# Patient Record
Sex: Male | Born: 1950
Health system: Southern US, Community
[De-identification: ages and names within clinical notes are randomized; demographics above are authoritative.]

## PROBLEM LIST (undated history)

## (undated) DIAGNOSIS — F4310 Post-traumatic stress disorder, unspecified: Secondary | ICD-10-CM

## (undated) DIAGNOSIS — G7289 Other specified myopathies: Secondary | ICD-10-CM

## (undated) DIAGNOSIS — N1831 Chronic kidney disease, stage 3a: Secondary | ICD-10-CM

## (undated) DIAGNOSIS — J40 Bronchitis, not specified as acute or chronic: Secondary | ICD-10-CM

## (undated) DIAGNOSIS — I5022 Chronic systolic (congestive) heart failure: Secondary | ICD-10-CM

## (undated) DIAGNOSIS — M359 Systemic involvement of connective tissue, unspecified: Secondary | ICD-10-CM

## (undated) DIAGNOSIS — E039 Hypothyroidism, unspecified: Secondary | ICD-10-CM

## (undated) DIAGNOSIS — I82409 Acute embolism and thrombosis of unspecified deep veins of unspecified lower extremity: Secondary | ICD-10-CM

## (undated) DIAGNOSIS — M2550 Pain in unspecified joint: Secondary | ICD-10-CM

## (undated) DIAGNOSIS — I214 Non-ST elevation (NSTEMI) myocardial infarction: Secondary | ICD-10-CM

## (undated) DIAGNOSIS — I2510 Atherosclerotic heart disease of native coronary artery without angina pectoris: Secondary | ICD-10-CM

## (undated) DIAGNOSIS — I2699 Other pulmonary embolism without acute cor pulmonale: Secondary | ICD-10-CM

## (undated) DIAGNOSIS — K219 Gastro-esophageal reflux disease without esophagitis: Secondary | ICD-10-CM

## (undated) DIAGNOSIS — R066 Hiccough: Secondary | ICD-10-CM

## (undated) DIAGNOSIS — E119 Type 2 diabetes mellitus without complications: Secondary | ICD-10-CM

## (undated) HISTORY — DX: Post-traumatic stress disorder, unspecified: F43.10

## (undated) HISTORY — PX: BIOPSY SHOULDER: PRO31

## (undated) HISTORY — DX: Hypothyroidism, unspecified: E03.9

## (undated) HISTORY — DX: Pain in unspecified joint: M25.50

## (undated) HISTORY — PX: UPPER LEG SOFT TISSUE BIOPSY: SUR152

---

## 2001-06-18 ENCOUNTER — Encounter: Admission: RE | Admit: 2001-06-18 | Discharge: 2001-09-16 | Payer: Self-pay | Admitting: Family Medicine

## 2007-07-04 ENCOUNTER — Emergency Department (HOSPITAL_COMMUNITY): Admission: EM | Admit: 2007-07-04 | Discharge: 2007-07-04 | Payer: Self-pay | Admitting: Emergency Medicine

## 2007-07-23 ENCOUNTER — Encounter: Admission: RE | Admit: 2007-07-23 | Discharge: 2007-10-21 | Payer: Self-pay | Admitting: Family Medicine

## 2007-10-23 ENCOUNTER — Encounter: Admission: RE | Admit: 2007-10-23 | Discharge: 2007-11-07 | Payer: Self-pay | Admitting: Family Medicine

## 2007-12-05 ENCOUNTER — Ambulatory Visit (HOSPITAL_COMMUNITY): Admission: RE | Admit: 2007-12-05 | Discharge: 2007-12-05 | Payer: Self-pay | Admitting: Family Medicine

## 2008-02-10 ENCOUNTER — Ambulatory Visit (HOSPITAL_COMMUNITY): Admission: RE | Admit: 2008-02-10 | Discharge: 2008-02-10 | Payer: Self-pay | Admitting: Family Medicine

## 2011-01-22 LAB — POCT CARDIAC MARKERS
CKMB, poc: 2.6
Operator id: 285841
Troponin i, poc: <0.05

## 2011-01-22 LAB — POCT I-STAT CREATININE
Creatinine, Ser: 1.5
Operator id: 285841

## 2011-01-22 LAB — I-STAT 8, (EC8 V) (CONVERTED LAB)
BUN: 15
Bicarbonate: 27.9 — ABNORMAL HIGH
Potassium: 4.4
Sodium: 137

## 2013-07-08 ENCOUNTER — Encounter (HOSPITAL_BASED_OUTPATIENT_CLINIC_OR_DEPARTMENT_OTHER): Payer: Self-pay | Admitting: Emergency Medicine

## 2013-07-08 ENCOUNTER — Emergency Department (HOSPITAL_BASED_OUTPATIENT_CLINIC_OR_DEPARTMENT_OTHER): Payer: Medicare Other

## 2013-07-08 ENCOUNTER — Emergency Department (HOSPITAL_BASED_OUTPATIENT_CLINIC_OR_DEPARTMENT_OTHER)
Admission: EM | Admit: 2013-07-08 | Discharge: 2013-07-08 | Disposition: A | Discharge status: Home / Self Care | Payer: Medicare Other | Attending: Emergency Medicine | Admitting: Emergency Medicine

## 2013-07-08 DIAGNOSIS — J329 Chronic sinusitis, unspecified: Secondary | ICD-10-CM | POA: Insufficient documentation

## 2013-07-08 DIAGNOSIS — E119 Type 2 diabetes mellitus without complications: Secondary | ICD-10-CM | POA: Insufficient documentation

## 2013-07-08 DIAGNOSIS — IMO0002 Reserved for concepts with insufficient information to code with codable children: Secondary | ICD-10-CM | POA: Diagnosis not present

## 2013-07-08 DIAGNOSIS — Z7982 Long term (current) use of aspirin: Secondary | ICD-10-CM | POA: Insufficient documentation

## 2013-07-08 DIAGNOSIS — K219 Gastro-esophageal reflux disease without esophagitis: Secondary | ICD-10-CM | POA: Insufficient documentation

## 2013-07-08 DIAGNOSIS — J029 Acute pharyngitis, unspecified: Secondary | ICD-10-CM | POA: Diagnosis present

## 2013-07-08 DIAGNOSIS — Z79899 Other long term (current) drug therapy: Secondary | ICD-10-CM | POA: Insufficient documentation

## 2013-07-08 HISTORY — DX: Gastro-esophageal reflux disease without esophagitis: K21.9

## 2013-07-08 HISTORY — DX: Type 2 diabetes mellitus without complications: E11.9

## 2013-07-08 MED ORDER — DEXAMETHASONE 4 MG PO TABS
ORAL_TABLET | ORAL | Status: AC
  Filled 2013-07-08: qty 3

## 2013-07-08 MED ORDER — ACETAMINOPHEN 500 MG PO TABS
1000.0000 mg | ORAL_TABLET | Freq: Once | ORAL | Status: AC
  Administered 2013-07-08: 1000 mg via ORAL
  Filled 2013-07-08: qty 2

## 2013-07-08 MED ORDER — AMOXICILLIN 500 MG PO CAPS: 500.0000 mg | ORAL_CAPSULE | Freq: Three times a day (TID) | ORAL | Status: DC

## 2013-07-08 MED ORDER — DEXAMETHASONE 4 MG PO TABS
10.0000 mg | ORAL_TABLET | Freq: Once | ORAL | Status: AC
  Administered 2013-07-08: 10 mg via ORAL

## 2013-07-08 MED ORDER — IBUPROFEN 400 MG PO TABS
600.0000 mg | ORAL_TABLET | Freq: Once | ORAL | Status: AC
  Administered 2013-07-08: 600 mg via ORAL
  Filled 2013-07-08 (×2): qty 1

## 2013-07-08 NOTE — ED Notes (Signed)
Patient transported to X-ray 

## 2013-07-08 NOTE — ED Notes (Signed)
Pt reports facial pain "sinuses", cough, "shaking", and unable to sleep since Friday.  Symptoms worsened yesterday.  He has been taking numerous OTC medications without relief.

## 2013-07-08 NOTE — ED Provider Notes (Signed)
TIME SEEN: 10:14 AM  CHIEF COMPLAINT: Sinus congestion, cough  HPI: Patient is a 62 over no other history of diabetes and myositis who is on prednisone chronically who is followed by the [facility] who presents to the emergency department with complaints of 5 days of sinus pressure, cough, sore throat, chills. He states she's been trying multiple over-the-counter medications without relief. He states he has had sinus infections in the past and has needed antibiotics. He states normally amoxicillin helps with the symptoms. Denies any vomiting or diarrhea. No headache, neck pain or neck stiffness.  ROS: See HPI Constitutional: no fever  Eyes: no drainage  ENT: no runny nose   Cardiovascular:  no chest pain  Resp: no SOB  GI: no vomiting GU: no dysuria Integumentary: no rash  Allergy: no hives  Musculoskeletal: no leg swelling  Neurological: no slurred speech ROS otherwise negative  PAST MEDICAL HISTORY/PAST SURGICAL HISTORY:  Past Medical History  Diagnosis Date  . Diabetes mellitus without complication   . GERD (gastroesophageal reflux disease)     MEDICATIONS:  Prior to Admission medications   Medication Sig Start Date End Date Taking? Authorizing Provider  acarbose (PRECOSE) 100 MG tablet Take 100 mg by mouth 3 (three) times daily with meals.   Yes Historical Provider, MD  aspirin 81 MG tablet Take 81 mg by mouth daily.   Yes Historical Provider, MD  calcium carbonate (OS-CAL) 600 MG TABS tablet Take 600 mg by mouth 2 (two) times daily with a meal.   Yes Historical Provider, MD  glipiZIDE (GLUCOTROL XL) 10 MG 24 hr tablet Take 10 mg by mouth daily with breakfast.   Yes Historical Provider, MD  losartan (COZAAR) 50 MG tablet Take 50 mg by mouth daily.   Yes Historical Provider, MD  omeprazole (PRILOSEC) 20 MG capsule Take 20 mg by mouth daily.   Yes Historical Provider, MD  predniSONE (DELTASONE) 5 MG tablet Take 5 mg by mouth daily with breakfast.   Yes Historical Provider, MD     ALLERGIES:  Allergies  Allergen Reactions  . Hydrocodone   . Shellfish Allergy   . Ultram [Tramadol]     "shakes"    SOCIAL HISTORY:  History  Substance Use Topics  . Smoking status: Not on file  . Smokeless tobacco: Not on file  . Alcohol Use: Not on file    FAMILY HISTORY: No family history on file.  EXAM: BP 164/99  Pulse 120  Temp(Src) 99.6 F (37.6 C) (Oral)  Resp 20  SpO2 96% CONSTITUTIONAL: Alert and oriented and responds appropriately to questions. Well-appearing; well-nourished, pleasant, no apparent distress HEAD: Normocephalic EYES: Conjunctivae clear, PERRL ENT: normal nose; no rhinorrhea; moist mucous membranes; pharynx without lesions noted, no tonsillar hypertrophy or exudate, tender to palpation over bilateral maxillary sinuses; TMs are clear bilaterally NECK: Supple, no meningismus, no LAD  CARD: Tachycardic and regular; S1 and S2 appreciated; no murmurs, no clicks, no rubs, no gallops RESP: Normal chest excursion without splinting or tachypnea; breath sounds clear and equal bilaterally; no wheezes, no rhonchi, no rales,  ABD/GI: Normal bowel sounds; non-distended; soft, non-tender, no rebound, no guarding BACK:  The back appears normal and is non-tender to palpation, there is no CVA tenderness EXT: Normal ROM in all joints; non-tender to palpation; no edema; normal capillary refill; no cyanosis    SKIN: Normal color for age and race; warm NEURO: Moves all extremities equally PSYCH: The patient's mood and manner are appropriate. Grooming and personal hygiene are appropriate.  MEDICAL DECISION MAKING: Patient here with sinusitis. He is tachycardic but has a slightly elevated oral temperature. We'll give Tylenol, Decadron. We'll also obtain chest x-ray to rule out infiltrate. He is otherwise very well-appearing, pleasant, nontoxic, well-hydrated. Anticipate discharge home with oral antibiotics. Patient and wife are comfortable with this plan.  ED  PROGRESS: CXR clear.  No infiltrate or edema or pneumothorax. Will recheck heart rate after Tylenol.   11:20 AM Patient's temperature is now 99.7 he is still tachycardic. We'll give dose of ibuprofen. Patient reports he is seen by a nephrologist but states he's been told that he is okay to take NSAIDs. EKG shows sinus tachycardia.  12:13 PM  HR improving as temperature is improving.  Pt reports he had blood  work drawn last week at the [place] and he reports it was normal. No history of anemia. Denies any vomiting or diarrhea. Does not appear dehydrated on exam. Patient is sleeping comfortably. I do not feel he needs repeat blood work at this time and the patient and family agree. We'll discharge him with prescription for amoxicillin for his sinusitis. Given return precautions, supportive care instructions. Patient and wife verbalize understanding and are comfortable with plan.     EKG Interpretation  Date/Time:  Wednesday July 08 2013 11:19:37 EDT Ventricular Rate:  122 PR Interval:  148 QRS Duration: 82 QT Interval:  308 QTC Calculation: 438 R Axis:   -27 Text Interpretation:  Sinus tachycardia Minimal voltage criteria for LVH, may be normal variant Septal infarct , age undetermined Abnormal ECG Confirmed by Tyan Dy,  DO, Tayen Narang [phone]) on 07/08/2013 11:24:02 AM        [place], DO 07/08/13 1215

## 2013-07-08 NOTE — ED Notes (Signed)
MD at bedside. 

## 2013-07-08 NOTE — Discharge Instructions (Signed)

## 2014-07-15 ENCOUNTER — Encounter (HOSPITAL_BASED_OUTPATIENT_CLINIC_OR_DEPARTMENT_OTHER): Payer: Self-pay

## 2014-07-15 ENCOUNTER — Emergency Department (HOSPITAL_BASED_OUTPATIENT_CLINIC_OR_DEPARTMENT_OTHER)
Admission: EM | Admit: 2014-07-15 | Discharge: 2014-07-15 | Disposition: A | Discharge status: Home / Self Care | Payer: Medicare Other | Attending: Emergency Medicine | Admitting: Emergency Medicine

## 2014-07-15 ENCOUNTER — Emergency Department (HOSPITAL_BASED_OUTPATIENT_CLINIC_OR_DEPARTMENT_OTHER): Payer: Medicare Other

## 2014-07-15 DIAGNOSIS — Z8739 Personal history of other diseases of the musculoskeletal system and connective tissue: Secondary | ICD-10-CM | POA: Insufficient documentation

## 2014-07-15 DIAGNOSIS — Z7982 Long term (current) use of aspirin: Secondary | ICD-10-CM | POA: Diagnosis not present

## 2014-07-15 DIAGNOSIS — K219 Gastro-esophageal reflux disease without esophagitis: Secondary | ICD-10-CM | POA: Insufficient documentation

## 2014-07-15 DIAGNOSIS — Z79899 Other long term (current) drug therapy: Secondary | ICD-10-CM | POA: Insufficient documentation

## 2014-07-15 DIAGNOSIS — J069 Acute upper respiratory infection, unspecified: Secondary | ICD-10-CM | POA: Diagnosis not present

## 2014-07-15 DIAGNOSIS — E119 Type 2 diabetes mellitus without complications: Secondary | ICD-10-CM | POA: Insufficient documentation

## 2014-07-15 DIAGNOSIS — Z7952 Long term (current) use of systemic steroids: Secondary | ICD-10-CM | POA: Insufficient documentation

## 2014-07-15 DIAGNOSIS — R51 Headache: Secondary | ICD-10-CM | POA: Diagnosis present

## 2014-07-15 DIAGNOSIS — Z7951 Long term (current) use of inhaled steroids: Secondary | ICD-10-CM | POA: Diagnosis not present

## 2014-07-15 HISTORY — DX: Systemic involvement of connective tissue, unspecified: M35.9

## 2014-07-15 MED ORDER — FLUTICASONE PROPIONATE 50 MCG/ACT NA SUSP: 2.0000 | Freq: Every day | NASAL | Status: DC

## 2014-07-15 MED ORDER — GUAIFENESIN-CODEINE 100-10 MG/5ML PO SOLN: 10.0000 mL | Freq: Four times a day (QID) | ORAL | Status: DC | PRN

## 2014-07-15 NOTE — ED Provider Notes (Signed)
CSN: [id number]     Arrival date & time 07/15/14  1250 History   First MD Initiated Contact with Patient 07/15/14 1520     Chief Complaint  Patient presents with  . Facial Pain     (Consider location/radiation/quality/duration/timing/severity/associated sxs/prior Treatment) HPI Comments: Patient presents with a two-week history of runny nose and nasal congestion. He has a little bit of chest congestion but it's more in his nose and his throat. He reports some mild facial pain. He denies any fevers. He denies any nausea or vomiting. He denies any shortness of breath. He denies any chest pain. He's been using a Leica and other over-the-counter medicines without relief.   Past Medical History  Diagnosis Date  . Diabetes mellitus without complication   . GERD (gastroesophageal reflux disease)   . Autoimmune disease    History reviewed. No pertinent past surgical history. No family history on file. History  Substance Use Topics  . Smoking status: Not on file  . Smokeless tobacco: Not on file  . Alcohol Use: Not on file    Review of Systems  Constitutional: Negative for fever, chills, diaphoresis and fatigue.  HENT: Positive for congestion, postnasal drip, rhinorrhea and sinus pressure. Negative for sneezing.   Eyes: Negative.   Respiratory: Positive for cough. Negative for chest tightness and shortness of breath.   Cardiovascular: Negative for chest pain and leg swelling.  Gastrointestinal: Negative for nausea, vomiting, abdominal pain, diarrhea and blood in stool.  Genitourinary: Negative for frequency, hematuria, flank pain and difficulty urinating.  Musculoskeletal: Negative for back pain and arthralgias.  Skin: Negative for rash.  Neurological: Negative for dizziness, speech difficulty, weakness, numbness and headaches.      Allergies  Hydrocodone; Shellfish allergy; and Ultram  Home Medications   Prior to Admission medications   Medication Sig Start Date End Date  Taking? Authorizing Provider  folic acid (FOLVITE) 1 MG tablet Take 1 mg by mouth daily.   Yes Historical Provider, MD  methotrexate (RHEUMATREX) 2.5 MG tablet Take 2.5 mg by mouth once a week. Caution:Chemotherapy. Protect from light.   Yes Historical Provider, MD  sertraline (ZOLOFT) 100 MG tablet Take 100 mg by mouth daily.   Yes Historical Provider, MD  acarbose (PRECOSE) 100 MG tablet Take 100 mg by mouth 3 (three) times daily with meals.    Historical Provider, MD  aspirin 81 MG tablet Take 81 mg by mouth daily.    Historical Provider, MD  calcium carbonate (OS-CAL) 600 MG TABS tablet Take 600 mg by mouth 2 (two) times daily with a meal.    Historical Provider, MD  fluticasone (FLONASE) 50 MCG/ACT nasal spray Place 2 sprays into both nostrils daily. 07/15/14   Malvin Johns, MD  glipiZIDE (GLUCOTROL XL) 10 MG 24 hr tablet Take 10 mg by mouth daily with breakfast.    Historical Provider, MD  guaiFENesin-codeine 100-10 MG/5ML syrup Take 10 mLs by mouth every 6 (six) hours as needed for cough. 07/15/14   Malvin Johns, MD  losartan (COZAAR) 50 MG tablet Take 50 mg by mouth daily.    Historical Provider, MD  omeprazole (PRILOSEC) 20 MG capsule Take 20 mg by mouth daily.    Historical Provider, MD  predniSONE (DELTASONE) 5 MG tablet Take 5 mg by mouth daily with breakfast.    Historical Provider, MD   BP 123/72 mmHg  Pulse 92  Temp(Src) 98.1 F (36.7 C) (Oral)  Resp 18  Ht 5\' 8"  (1.727 m)  Wt 235 lb (106.595 kg)  BMI 35.74 kg/m2  SpO2 98% Physical Exam  Constitutional: He is oriented to person, place, and time. He appears well-developed and well-nourished.  HENT:  Head: Normocephalic and atraumatic.  Right Ear: External ear normal.  Left Ear: External ear normal.  Mouth/Throat: Oropharynx is clear and moist.  Eyes: Conjunctivae are normal. Pupils are equal, round, and reactive to light.  Neck: Normal range of motion. Neck supple.  Cardiovascular: Normal rate, regular rhythm and normal  heart sounds.   Pulmonary/Chest: Effort normal and breath sounds normal. No respiratory distress. He has no wheezes. He has no rales. He exhibits no tenderness.  Abdominal: Soft. Bowel sounds are normal. There is no tenderness. There is no rebound and no guarding.  Musculoskeletal: Normal range of motion. He exhibits no edema.  Lymphadenopathy:    He has no cervical adenopathy.  Neurological: He is alert and oriented to person, place, and time.  Skin: Skin is warm and dry. No rash noted.  Psychiatric: He has a normal mood and affect.    ED Course  Procedures (including critical care time) Labs Review Labs Reviewed - No data to display  Imaging Review Dg Chest 2 View  07/15/2014   CLINICAL DATA:  Cough and congestion.  Facial pain  EXAM: CHEST  2 VIEW  COMPARISON:  07/08/2013  FINDINGS: The heart size and mediastinal contours are within normal limits. Both lungs are clear. The visualized skeletal structures are unremarkable.  IMPRESSION: No active cardiopulmonary disease.   Electronically Signed   By: Franchot Gallo M.D.   On: 07/15/2014 15:33     EKG Interpretation None      MDM   Final diagnoses:  URI (upper respiratory infection)   patient with URI symptoms. There is no significant tenderness over the sinuses. He's afebrile. His lungs are clear. He was given a prescription for some cough medicine and Flonase to use in addition to the Allegra that he started using at home. He has an appointment to follow-up with his doctor tomorrow.    Malvin Johns, MD 07/16/14 0000

## 2014-07-15 NOTE — Discharge Instructions (Signed)
Upper Respiratory Infection, Adult An upper respiratory infection (URI) is also sometimes known as the common cold. The upper respiratory tract includes the nose, sinuses, throat, trachea, and bronchi. Bronchi are the airways leading to the lungs. Most people improve within 1 week, but symptoms can last up to 2 weeks. A residual cough may last even longer.  CAUSES Many different viruses can infect the tissues lining the upper respiratory tract. The tissues become irritated and inflamed and often become very moist. Mucus production is also common. A cold is contagious. You can easily spread the virus to others by oral contact. This includes kissing, sharing a glass, coughing, or sneezing. Touching your mouth or nose and then touching a surface, which is then touched by another person, can also spread the virus. SYMPTOMS  Symptoms typically develop 1 to 3 days after you come in contact with a cold virus. Symptoms vary from person to person. They may include:  Runny nose.  Sneezing.  Nasal congestion.  Sinus irritation.  Sore throat.  Loss of voice (laryngitis).  Cough.  Fatigue.  Muscle aches.  Loss of appetite.  Headache.  Low-grade fever. DIAGNOSIS  You might diagnose your own cold based on familiar symptoms, since most people get a cold 2 to 3 times a year. Your caregiver can confirm this based on your exam. Most importantly, your caregiver can check that your symptoms are not due to another disease such as strep throat, sinusitis, pneumonia, asthma, or epiglottitis. Blood tests, throat tests, and X-rays are not necessary to diagnose a common cold, but they may sometimes be helpful in excluding other more serious diseases. Your caregiver will decide if any further tests are required. RISKS AND COMPLICATIONS  You may be at risk for a more severe case of the common cold if you smoke cigarettes, have chronic heart disease (such as heart failure) or lung disease (such as asthma), or if  you have a weakened immune system. The very young and very old are also at risk for more serious infections. Bacterial sinusitis, middle ear infections, and bacterial pneumonia can complicate the common cold. The common cold can worsen asthma and chronic obstructive pulmonary disease (COPD). Sometimes, these complications can require emergency medical care and may be life-threatening. PREVENTION  The best way to protect against getting a cold is to practice good hygiene. Avoid oral or hand contact with people with cold symptoms. Wash your hands often if contact occurs. There is no clear evidence that vitamin C, vitamin E, echinacea, or exercise reduces the chance of developing a cold. However, it is always recommended to get plenty of rest and practice good nutrition. TREATMENT  Treatment is directed at relieving symptoms. There is no cure. Antibiotics are not effective, because the infection is caused by a virus, not by bacteria. Treatment may include:  Increased fluid intake. Sports drinks offer valuable electrolytes, sugars, and fluids.  Breathing heated mist or steam (vaporizer or shower).  Eating chicken soup or other clear broths, and maintaining good nutrition.  Getting plenty of rest.  Using gargles or lozenges for comfort.  Controlling fevers with ibuprofen or acetaminophen as directed by your caregiver.  Increasing usage of your inhaler if you have asthma. Zinc gel and zinc lozenges, taken in the first 24 hours of the common cold, can shorten the duration and lessen the severity of symptoms. Pain medicines may help with fever, muscle aches, and throat pain. A variety of non-prescription medicines are available to treat congestion and runny nose. Your caregiver   can make recommendations and may suggest nasal or lung inhalers for other symptoms.  HOME CARE INSTRUCTIONS   Only take over-the-counter or prescription medicines for pain, discomfort, or fever as directed by your  caregiver.  Use a warm mist humidifier or inhale steam from a shower to increase air moisture. This may keep secretions moist and make it easier to breathe.  Drink enough water and fluids to keep your urine clear or pale yellow.  Rest as needed.  Return to work when your temperature has returned to normal or as your caregiver advises. You may need to stay home longer to avoid infecting others. You can also use a face mask and careful hand washing to prevent spread of the virus. SEEK MEDICAL CARE IF:   After the first few days, you feel you are getting worse rather than better.  You need your caregiver's advice about medicines to control symptoms.  You develop chills, worsening shortness of breath, or brown or red sputum. These may be signs of pneumonia.  You develop yellow or brown nasal discharge or pain in the face, especially when you bend forward. These may be signs of sinusitis.  You develop a fever, swollen neck glands, pain with swallowing, or white areas in the back of your throat. These may be signs of strep throat. SEEK IMMEDIATE MEDICAL CARE IF:   You have a fever.  You develop severe or persistent headache, ear pain, sinus pain, or chest pain.  You develop wheezing, a prolonged cough, cough up blood, or have a change in your usual mucus (if you have chronic lung disease).  You develop sore muscles or a stiff neck. Document Released: 10/10/2000 Document Revised: 07/09/2011 Document Reviewed: 07/22/2013 ExitCare Patient Information 2015 [employer]. This information is not intended to replace advice given to you by your health care provider. Make sure you discuss any questions you have with your health care provider.  

## 2014-07-15 NOTE — ED Notes (Signed)
Cough and cold symptoms x 2 weeks unrelieved after taking OTC medications.  Reports facial pain today.

## 2014-09-28 ENCOUNTER — Encounter (HOSPITAL_BASED_OUTPATIENT_CLINIC_OR_DEPARTMENT_OTHER): Payer: Self-pay | Admitting: Emergency Medicine

## 2014-09-28 ENCOUNTER — Inpatient Hospital Stay (HOSPITAL_BASED_OUTPATIENT_CLINIC_OR_DEPARTMENT_OTHER)
Admission: EM | Admit: 2014-09-28 | Discharge: 2014-10-01 | DRG: 872 | Disposition: A | Discharge status: Home / Self Care | Payer: Medicare Other | Attending: Internal Medicine | Admitting: Internal Medicine

## 2014-09-28 ENCOUNTER — Emergency Department (HOSPITAL_BASED_OUTPATIENT_CLINIC_OR_DEPARTMENT_OTHER): Payer: Medicare Other

## 2014-09-28 DIAGNOSIS — Z888 Allergy status to other drugs, medicaments and biological substances status: Secondary | ICD-10-CM

## 2014-09-28 DIAGNOSIS — Z91013 Allergy to seafood: Secondary | ICD-10-CM | POA: Diagnosis not present

## 2014-09-28 DIAGNOSIS — N189 Chronic kidney disease, unspecified: Secondary | ICD-10-CM | POA: Diagnosis present

## 2014-09-28 DIAGNOSIS — R509 Fever, unspecified: Secondary | ICD-10-CM | POA: Diagnosis present

## 2014-09-28 DIAGNOSIS — E119 Type 2 diabetes mellitus without complications: Secondary | ICD-10-CM

## 2014-09-28 DIAGNOSIS — M6080 Other myositis, unspecified site: Secondary | ICD-10-CM | POA: Diagnosis present

## 2014-09-28 DIAGNOSIS — I10 Essential (primary) hypertension: Secondary | ICD-10-CM | POA: Diagnosis present

## 2014-09-28 DIAGNOSIS — Z7982 Long term (current) use of aspirin: Secondary | ICD-10-CM

## 2014-09-28 DIAGNOSIS — Z7901 Long term (current) use of anticoagulants: Secondary | ICD-10-CM | POA: Diagnosis not present

## 2014-09-28 DIAGNOSIS — N390 Urinary tract infection, site not specified: Secondary | ICD-10-CM | POA: Diagnosis present

## 2014-09-28 DIAGNOSIS — J111 Influenza due to unidentified influenza virus with other respiratory manifestations: Secondary | ICD-10-CM | POA: Diagnosis present

## 2014-09-28 DIAGNOSIS — R6510 Systemic inflammatory response syndrome (SIRS) of non-infectious origin without acute organ dysfunction: Secondary | ICD-10-CM | POA: Diagnosis present

## 2014-09-28 DIAGNOSIS — E1165 Type 2 diabetes mellitus with hyperglycemia: Secondary | ICD-10-CM | POA: Diagnosis not present

## 2014-09-28 DIAGNOSIS — A419 Sepsis, unspecified organism: Principal | ICD-10-CM | POA: Diagnosis present

## 2014-09-28 DIAGNOSIS — K219 Gastro-esophageal reflux disease without esophagitis: Secondary | ICD-10-CM | POA: Diagnosis present

## 2014-09-28 DIAGNOSIS — I129 Hypertensive chronic kidney disease with stage 1 through stage 4 chronic kidney disease, or unspecified chronic kidney disease: Secondary | ICD-10-CM | POA: Diagnosis present

## 2014-09-28 DIAGNOSIS — R69 Illness, unspecified: Secondary | ICD-10-CM

## 2014-09-28 DIAGNOSIS — M609 Myositis, unspecified: Secondary | ICD-10-CM | POA: Diagnosis present

## 2014-09-28 HISTORY — DX: Type 2 diabetes mellitus without complications: E11.9

## 2014-09-28 LAB — COMPREHENSIVE METABOLIC PANEL
ALK PHOS: 59 U/L (ref 38–126)
ALT: 60 U/L (ref 17–63)
ANION GAP: 8 (ref 5–15)
AST: 68 U/L — ABNORMAL HIGH (ref 15–41)
Albumin: 4 g/dL (ref 3.5–5.0)
BILIRUBIN TOTAL: 0.4 mg/dL (ref 0.3–1.2)
BUN: 13 mg/dL (ref 6–20)
CHLORIDE: 102 mmol/L (ref 101–111)
CO2: 24 mmol/L (ref 22–32)
CREATININE: 1.63 mg/dL — AB (ref 0.61–1.24)
Calcium: 8.5 mg/dL — ABNORMAL LOW (ref 8.9–10.3)
GFR calc Af Amer: 49 mL/min — ABNORMAL LOW (ref >60)
GFR, EST NON AFRICAN AMERICAN: 43 mL/min — AB (ref >60)
GLUCOSE: 186 mg/dL — AB (ref 65–99)
POTASSIUM: 3.8 mmol/L (ref 3.5–5.1)
Sodium: 134 mmol/L — ABNORMAL LOW (ref 135–145)
Total Protein: 6.7 g/dL (ref 6.5–8.1)

## 2014-09-28 LAB — CBC WITH DIFFERENTIAL/PLATELET
BASOS ABS: 0 10*3/uL (ref 0.0–0.1)
BASOS PCT: 1 % (ref 0–1)
EOS ABS: 0 10*3/uL (ref 0.0–0.7)
Eosinophils Relative: 0 % (ref 0–5)
HCT: 35.2 % — ABNORMAL LOW (ref 39.0–52.0)
Hemoglobin: 11.7 g/dL — ABNORMAL LOW (ref 13.0–17.0)
Lymphocytes Relative: 11 % — ABNORMAL LOW (ref 12–46)
Lymphs Abs: 0.8 10*3/uL (ref 0.7–4.0)
MCH: 29.7 pg (ref 26.0–34.0)
MCHC: 33.2 g/dL (ref 30.0–36.0)
MCV: 89.3 fL (ref 78.0–100.0)
MONOS PCT: 7 % (ref 3–12)
Monocytes Absolute: 0.5 10*3/uL (ref 0.1–1.0)
NEUTROS PCT: 81 % — AB (ref 43–77)
Neutro Abs: 6 10*3/uL (ref 1.7–7.7)
PLATELETS: 165 10*3/uL (ref 150–400)
RBC: 3.94 MIL/uL — ABNORMAL LOW (ref 4.22–5.81)
RDW: 14.3 % (ref 11.5–15.5)
WBC: 7.4 10*3/uL (ref 4.0–10.5)

## 2014-09-28 LAB — URINALYSIS, ROUTINE W REFLEX MICROSCOPIC
BILIRUBIN URINE: NEGATIVE
Glucose, UA: 250 mg/dL — AB
KETONES UR: NEGATIVE mg/dL
Nitrite: NEGATIVE
PH: 5.5 (ref 5.0–8.0)
Protein, ur: NEGATIVE mg/dL
SPECIFIC GRAVITY, URINE: 1.016 (ref 1.005–1.030)
Urobilinogen, UA: 0.2 mg/dL (ref 0.0–1.0)

## 2014-09-28 LAB — CBG MONITORING, ED: Glucose-Capillary: 184 mg/dL — ABNORMAL HIGH (ref 65–99)

## 2014-09-28 LAB — URINE MICROSCOPIC-ADD ON

## 2014-09-28 LAB — RAPID STREP SCREEN (MED CTR MEBANE ONLY): STREPTOCOCCUS, GROUP A SCREEN (DIRECT): NEGATIVE

## 2014-09-28 LAB — CK: Total CK: 2937 U/L — ABNORMAL HIGH (ref 49–397)

## 2014-09-28 LAB — I-STAT CG4 LACTIC ACID, ED: LACTIC ACID, VENOUS: 1.33 mmol/L (ref 0.5–2.0)

## 2014-09-28 MED ORDER — ASPIRIN EC 81 MG PO TBEC
81.0000 mg | DELAYED_RELEASE_TABLET | Freq: Every day | ORAL | Status: DC
Start: 2014-09-28 — End: 2014-10-01
  Administered 2014-09-28 – 2014-10-01 (×4): 81 mg via ORAL
  Filled 2014-09-28 (×4): qty 1

## 2014-09-28 MED ORDER — DEXTROSE 5 % IV SOLN
1.0000 g | Freq: Once | INTRAVENOUS | Status: AC
  Administered 2014-09-28: 1 g via INTRAVENOUS

## 2014-09-28 MED ORDER — LOSARTAN POTASSIUM 50 MG PO TABS
50.0000 mg | ORAL_TABLET | Freq: Every day | ORAL | Status: DC
  Administered 2014-09-29 – 2014-10-01 (×3): 50 mg via ORAL
  Filled 2014-09-28 (×4): qty 1

## 2014-09-28 MED ORDER — ACETAMINOPHEN 325 MG PO TABS
ORAL_TABLET | ORAL | Status: AC
  Administered 2014-09-28: 650 mg via ORAL
  Filled 2014-09-28: qty 2

## 2014-09-28 MED ORDER — HEPARIN SODIUM (PORCINE) 5000 UNIT/ML IJ SOLN
5000.0000 [IU] | Freq: Three times a day (TID) | INTRAMUSCULAR | Status: DC
  Administered 2014-09-28 – 2014-10-01 (×8): 5000 [IU] via SUBCUTANEOUS
  Filled 2014-09-28 (×10): qty 1

## 2014-09-28 MED ORDER — CEFTRIAXONE SODIUM 1 G IJ SOLR
INTRAMUSCULAR | Status: AC
Start: 2014-09-28 — End: 2014-09-28
  Filled 2014-09-28: qty 10

## 2014-09-28 MED ORDER — VANCOMYCIN HCL IN DEXTROSE 1-5 GM/200ML-% IV SOLN
1000.0000 mg | Freq: Once | INTRAVENOUS | Status: AC
  Administered 2014-09-28: 1000 mg via INTRAVENOUS
  Filled 2014-09-28: qty 200

## 2014-09-28 MED ORDER — ACETAMINOPHEN 325 MG PO TABS
650.0000 mg | ORAL_TABLET | Freq: Four times a day (QID) | ORAL | Status: DC | PRN
  Administered 2014-09-29: 650 mg via ORAL
  Filled 2014-09-28 (×2): qty 2

## 2014-09-28 MED ORDER — SERTRALINE HCL 100 MG PO TABS
100.0000 mg | ORAL_TABLET | Freq: Every day | ORAL | Status: DC
  Administered 2014-09-29: 100 mg via ORAL
  Filled 2014-09-28 (×4): qty 1

## 2014-09-28 MED ORDER — GLIPIZIDE ER 10 MG PO TB24
10.0000 mg | ORAL_TABLET | Freq: Every day | ORAL | Status: DC
  Administered 2014-09-29 – 2014-10-01 (×3): 10 mg via ORAL
  Filled 2014-09-28 (×4): qty 1

## 2014-09-28 MED ORDER — OXYCODONE HCL 5 MG PO TABS
5.0000 mg | ORAL_TABLET | ORAL | Status: AC | PRN
  Administered 2014-09-29 (×2): 5 mg via ORAL
  Filled 2014-09-28 (×2): qty 1

## 2014-09-28 MED ORDER — ACETAMINOPHEN 325 MG PO TABS
650.0000 mg | ORAL_TABLET | Freq: Once | ORAL | Status: AC
  Administered 2014-09-28: 650 mg via ORAL

## 2014-09-28 MED ORDER — GUAIFENESIN-CODEINE 100-10 MG/5ML PO SOLN
10.0000 mL | Freq: Four times a day (QID) | ORAL | Status: DC | PRN
  Administered 2014-09-28: 10 mL via ORAL
  Filled 2014-09-28: qty 10

## 2014-09-28 MED ORDER — FLUTICASONE PROPIONATE 50 MCG/ACT NA SUSP
2.0000 | Freq: Every day | NASAL | Status: DC
  Administered 2014-09-28 – 2014-09-29 (×2): 2 via NASAL
  Filled 2014-09-28 (×2): qty 16

## 2014-09-28 MED ORDER — SODIUM CHLORIDE 0.9 % IV SOLN
INTRAVENOUS | Status: AC
  Administered 2014-09-28: 19:00:00 via INTRAVENOUS

## 2014-09-28 MED ORDER — PANTOPRAZOLE SODIUM 40 MG PO TBEC
40.0000 mg | DELAYED_RELEASE_TABLET | Freq: Every day | ORAL | Status: DC
  Administered 2014-09-28 – 2014-10-01 (×4): 40 mg via ORAL
  Filled 2014-09-28 (×4): qty 1

## 2014-09-28 MED ORDER — ONDANSETRON HCL 4 MG/2ML IJ SOLN: 4.0000 mg | Freq: Three times a day (TID) | INTRAMUSCULAR | Status: AC | PRN

## 2014-09-28 MED ORDER — ACARBOSE 100 MG PO TABS
100.0000 mg | ORAL_TABLET | Freq: Three times a day (TID) | ORAL | Status: DC
  Administered 2014-09-29 – 2014-10-01 (×7): 100 mg via ORAL
  Filled 2014-09-28 (×10): qty 1

## 2014-09-28 MED ORDER — CEFTRIAXONE SODIUM IN DEXTROSE 20 MG/ML IV SOLN
1.0000 g | INTRAVENOUS | Status: DC
  Administered 2014-09-29 – 2014-09-30 (×2): 1 g via INTRAVENOUS
  Filled 2014-09-28 (×3): qty 50

## 2014-09-28 MED ORDER — FOLIC ACID 1 MG PO TABS
1.0000 mg | ORAL_TABLET | Freq: Every day | ORAL | Status: DC
  Administered 2014-09-28 – 2014-10-01 (×4): 1 mg via ORAL
  Filled 2014-09-28 (×4): qty 1

## 2014-09-28 MED ORDER — PREDNISONE 5 MG PO TABS
5.0000 mg | ORAL_TABLET | Freq: Every day | ORAL | Status: DC
  Administered 2014-09-29 – 2014-10-01 (×3): 5 mg via ORAL
  Filled 2014-09-28 (×4): qty 1

## 2014-09-28 MED ORDER — ACETAMINOPHEN 325 MG PO TABS: 650.0000 mg | ORAL_TABLET | Freq: Four times a day (QID) | ORAL | Status: DC | PRN

## 2014-09-28 MED ORDER — CALCIUM CARBONATE 1250 (500 CA) MG PO TABS
1250.0000 mg | ORAL_TABLET | Freq: Two times a day (BID) | ORAL | Status: DC
  Administered 2014-09-29 – 2014-10-01 (×5): 1250 mg via ORAL
  Filled 2014-09-28 (×7): qty 1

## 2014-09-28 MED ORDER — SODIUM CHLORIDE 0.9 % IV SOLN
INTRAVENOUS | Status: DC
  Administered 2014-09-28: 23:00:00 via INTRAVENOUS
  Administered 2014-09-29: 1000 mL via INTRAVENOUS
  Administered 2014-09-29 – 2014-09-30 (×3): via INTRAVENOUS

## 2014-09-28 NOTE — H&P (Signed)
[facility] History and Physical  Amanda Hestand ZOX:[id number] DOB: 14-Aug-1950 DOA: 09/28/2014  Referring physician: EDP PCP: No primary care provider on file.   Chief Complaint: Body aches   HPI: [name] is a 64 y.o. male presents to the ED at [facility] for 3 day history of sore throat, non-productive cough, body aches, chills and fever.  He has PMH of inflammatory myositis for which he is on MTX and prednisone.  No diarrhea, abdominal pain, vomiting, rash.  Tm 103.2 in ED.  No sick contacts.  Review of Systems: Systems reviewed.  As above, otherwise negative  Past Medical History  Diagnosis Date  . Diabetes mellitus without complication   . GERD (gastroesophageal reflux disease)   . Autoimmune disease    History reviewed. No pertinent past surgical history. Social History:  reports that he has never smoked. He has never used smokeless tobacco. He reports that he does not drink alcohol or use illicit drugs.  Allergies  Allergen Reactions  . Hydrocodone   . Shellfish Allergy   . Ultram [Tramadol]     "shakes"    History reviewed. No pertinent family history.   Prior to Admission medications   Medication Sig Start Date End Date Taking? Authorizing Provider  acarbose (PRECOSE) 100 MG tablet Take 100 mg by mouth 3 (three) times daily with meals.    Historical Provider, MD  aspirin 81 MG tablet Take 81 mg by mouth daily.    Historical Provider, MD  calcium carbonate (OS-CAL) 600 MG TABS tablet Take 600 mg by mouth 2 (two) times daily with a meal.    Historical Provider, MD  fluticasone (FLONASE) 50 MCG/ACT nasal spray Place 2 sprays into both nostrils daily. 07/15/14   Malvin Johns, MD  folic acid (FOLVITE) 1 MG tablet Take 1 mg by mouth daily.    Historical Provider, MD  glipiZIDE (GLUCOTROL XL) 10 MG 24 hr tablet Take 10 mg by mouth daily with breakfast.    Historical Provider, MD  guaiFENesin-codeine 100-10 MG/5ML syrup Take 10 mLs by mouth every 6 (six) hours as needed for  cough. 07/15/14   Malvin Johns, MD  losartan (COZAAR) 50 MG tablet Take 50 mg by mouth daily.    Historical Provider, MD  methotrexate (RHEUMATREX) 2.5 MG tablet Take 2.5 mg by mouth once a week. Caution:Chemotherapy. Protect from light.    Historical Provider, MD  omeprazole (PRILOSEC) 20 MG capsule Take 20 mg by mouth daily.    Historical Provider, MD  predniSONE (DELTASONE) 5 MG tablet Take 5 mg by mouth daily with breakfast.    Historical Provider, MD  sertraline (ZOLOFT) 100 MG tablet Take 100 mg by mouth daily.    Historical Provider, MD   Physical Exam: Filed Vitals:   09/28/14 2028  BP: 113/60  Pulse: 106  Temp: 102.1 F (38.9 C)  Resp: 18    BP 113/60 mmHg  Pulse 106  Temp(Src) 102.1 F (38.9 C) (Oral)  Resp 18  Ht 5' 8.5" (1.74 m)  Wt 103.919 kg (229 lb 1.6 oz)  BMI 34.32 kg/m2  SpO2 98%  General Appearance:    Alert, oriented, no distress, appears stated age  Head:    Normocephalic, atraumatic  Eyes:    PERRL, EOMI, sclera non-icteric        Nose:   Nares without drainage or epistaxis. Mucosa, turbinates normal  Throat:   Moist mucous membranes. Oropharynx without erythema or exudate.  Neck:   Supple. No carotid bruits.  No thyromegaly.  No lymphadenopathy.   Back:     No CVA tenderness, no spinal tenderness  Lungs:     Clear to auscultation bilaterally, without wheezes, rhonchi or rales  Chest wall:    No tenderness to palpitation  Heart:    Regular rate and rhythm without murmurs, gallops, rubs  Abdomen:     Soft, non-tender, nondistended, normal bowel sounds, no organomegaly  Genitalia:    deferred  Rectal:    deferred  Extremities:   No clubbing, cyanosis or edema.  Pulses:   2+ and symmetric all extremities  Skin:   Skin color, texture, turgor normal, no rashes or lesions  Lymph nodes:   Cervical, supraclavicular, and axillary nodes normal  Neurologic:   CNII-XII intact. Normal strength, sensation and reflexes      throughout    Labs on Admission:   Basic Metabolic Panel:  Recent Labs Lab 09/28/14 1615  NA 134*  K 3.8  CL 102  CO2 24  GLUCOSE 186*  BUN 13  CREATININE 1.63*  CALCIUM 8.5*   Liver Function Tests:  Recent Labs Lab 09/28/14 1615  AST 68*  ALT 60  ALKPHOS 59  BILITOT 0.4  PROT 6.7  ALBUMIN 4.0   No results for input(s): LIPASE, AMYLASE in the last 168 hours. No results for input(s): AMMONIA in the last 168 hours. CBC:  Recent Labs Lab 09/28/14 1615  WBC 7.4  NEUTROABS 6.0  HGB 11.7*  HCT 35.2*  MCV 89.3  PLT 165   Cardiac Enzymes:  Recent Labs Lab 09/28/14 1615  CKTOTAL 2937*    BNP (last 3 results) No results for input(s): PROBNP in the last 8760 hours. CBG:  Recent Labs Lab 09/28/14 1544  GLUCAP 184*    Radiological Exams on Admission: Dg Chest 2 View  09/28/2014   CLINICAL DATA:  Four day history of cough and fever ; congestion  EXAM: CHEST  2 VIEW  COMPARISON:  July 15, 2014  FINDINGS: There is no edema or consolidation. The heart size and pulmonary vascularity are normal. No adenopathy. There is degenerative change in the thoracic spine.  IMPRESSION: No edema or consolidation.   Electronically Signed   By: Lowella Grip III M.D.   On: 09/28/2014 16:00    EKG: Independently reviewed.  Assessment/Plan Active Problems:   Fever   SIRS (systemic inflammatory response syndrome)   Influenza-like illness   UTI (urinary tract infection)   Myositis   DM2 (diabetes mellitus, type 2)   HTN (hypertension)   1. SIRS with ILI - work up thus far has found only a mild UTI on UA, 1. Treat the UTI found on UA with rocephin 2. However given the ILI, symptoms could also be viral in nature 3. Checking flu panel (Flu is a possibility although less likely due to this not being flu season). 4. Urine and blood CX pending 5. Tylenol for fever 6. IVF for SIRS, patient's tachycardia already improved somewhat. 2. UTI - treating with rocephin as above 3. Myositis - continue home MTX and  prednisone 4. DM2 - continue home meds, cbg checks AC/HS 5. HTN - continue home meds    Code Status: Full  Family Communication: No family in room Disposition Plan: Admit to inpatient   Time spent: 94 min  GARDNER, JARED M. [facility] Pager [phone]  If 7AM-7PM, please contact the day team taking care of the patient [email or web address] Password [facility] 09/28/2014, 8:51 PM

## 2014-09-28 NOTE — ED Notes (Signed)
Attempt x 2 to call report to 5W- RN will call back

## 2014-09-28 NOTE — Plan of Care (Signed)
ED md : Dr Audie Pinto  64 year old patient presenting to [facility] ED with 3 days of myalgias associated with sore throat and cough. On arrival, Febrile 103, SBP108, 100-124 has history of inflammatory myositis, recently started Methotrexate. Improved after IV fluids.  UA positive for UTI  CXR neg for PNA  Accepted to med tele.   [name] M.D. [facility] 09/28/2014, 6:04 PM  Pager: [phone]

## 2014-09-28 NOTE — ED Provider Notes (Signed)
CSN: [id number]     Arrival date & time 09/28/14  1500 History   First MD Initiated Contact with Patient 09/28/14 1514     Chief Complaint  Patient presents with  . Generalized Body Aches      HPI Patient presents with generalized bodyaches for the last 3 days associated with sore throat and cough.  Patient had chills and fever.  Has past medical history of inflammatory myositis.  Does take methotrexate for that.  Patient denies diarrhea, abdominal pain, vomiting.  Denies rash or recent tick bite. Past Medical History  Diagnosis Date  . Diabetes mellitus without complication   . GERD (gastroesophageal reflux disease)   . Autoimmune disease    History reviewed. No pertinent past surgical history. History reviewed. No pertinent family history. History  Substance Use Topics  . Smoking status: Never Smoker   . Smokeless tobacco: Never Used  . Alcohol Use: No    Review of Systems All other systems reviewed and are negative   Allergies  Hydrocodone; Shellfish allergy; and Ultram  Home Medications   Prior to Admission medications   Medication Sig Start Date End Date Taking? Authorizing Provider  acarbose (PRECOSE) 100 MG tablet Take 100 mg by mouth 3 (three) times daily with meals.   Yes Historical Provider, MD  aspirin 81 MG tablet Take 81 mg by mouth daily.   Yes Historical Provider, MD  calcium carbonate (OS-CAL) 600 MG TABS tablet Take 600 mg by mouth 2 (two) times daily with a meal.   Yes Historical Provider, MD  fluticasone (FLONASE) 50 MCG/ACT nasal spray Place 2 sprays into both nostrils daily. 07/15/14  Yes Malvin Johns, MD  folic acid (FOLVITE) 1 MG tablet Take 1 mg by mouth daily.   Yes Historical Provider, MD  glipiZIDE (GLUCOTROL XL) 10 MG 24 hr tablet Take 10 mg by mouth daily with breakfast.   Yes Historical Provider, MD  losartan (COZAAR) 50 MG tablet Take 50 mg by mouth daily.   Yes Historical Provider, MD  omeprazole (PRILOSEC) 20 MG capsule Take 20 mg by mouth  daily.   Yes Historical Provider, MD  predniSONE (DELTASONE) 5 MG tablet Take 5 mg by mouth daily with breakfast.   Yes Historical Provider, MD  sertraline (ZOLOFT) 100 MG tablet Take 100 mg by mouth daily.   Yes Historical Provider, MD  cephALEXin (KEFLEX) 500 MG capsule Take 1 capsule (500 mg total) by mouth every 8 (eight) hours. FOR 5 DAYS STARTING TONIGHT AT 9-10PM. 10/01/14   Bonnielee Haff, MD  insulin glargine (LANTUS) 100 UNIT/ML injection Inject 0.46-0.56 mLs (46-56 Units total) into the skin at bedtime. For now take 30 units twice a day and then increase to your usual dose as your oral intake improves. Increase both doses by 2units every day. Check your blood sugars at home three times daily. Call your doctor if sugar level is more than 300 or less than 80. 10/01/14   Bonnielee Haff, MD  methotrexate (RHEUMATREX) 5 MG tablet Take 2 tablets (10 mg total) by mouth once a week. Caution: Chemotherapy. Protect from light. Take on fridays per patient.  OKAY TO RESUME FROM 10/08/14. 10/01/14   Bonnielee Haff, MD   BP 152/87 mmHg  Pulse 90  Temp(Src) 98.2 F (36.8 C) (Oral)  Resp 18  Ht 5' 8.5" (1.74 m)  Wt 229 lb 1.6 oz (103.919 kg)  BMI 34.32 kg/m2  SpO2 98% Physical Exam Physical Exam  Nursing note and vitals reviewed and the patient was  febrile . Constitutional: He is oriented to person, place, and time. He appears well-developed and well-nourished. No distress.  HENT:  Head: Normocephalic and atraumatic.  Eyes: Pupils are equal, round, and reactive to light.  Neck: Normal range of motion.  Cardiovascular: Normal rate and intact distal pulses.   Pulmonary/Chest: No respiratory distress.  Abdominal: Normal appearance. He exhibits no distension.  No tenderness to palpation.  No rebound or guarding tenderness.  Active bowel sounds in all quadrants.   Musculoskeletal: Normal range of motion.  Neurological: He is alert and oriented to person, place, and time. No cranial nerve deficit.   Skin: Skin is warm and dry. No rash noted.  Psychiatric: He has a normal mood and affect. His behavior is normal.   ED Course  Procedures (including critical care time) Labs Review Labs Reviewed  CBC WITH DIFFERENTIAL/PLATELET - Abnormal; Notable for the following:    RBC 3.94 (*)    Hemoglobin 11.7 (*)    HCT 35.2 (*)    Neutrophils Relative % 81 (*)    Lymphocytes Relative 11 (*)    All other components within normal limits  COMPREHENSIVE METABOLIC PANEL - Abnormal; Notable for the following:    Sodium 134 (*)    Glucose, Bld 186 (*)    Creatinine, Ser 1.63 (*)    Calcium 8.5 (*)    AST 68 (*)    GFR calc non Af Amer 43 (*)    GFR calc Af Amer 49 (*)    All other components within normal limits  URINALYSIS, ROUTINE W REFLEX MICROSCOPIC (NOT AT [facility]) - Abnormal; Notable for the following:    Glucose, UA 250 (*)    Hgb urine dipstick MODERATE (*)    Leukocytes, UA MODERATE (*)    All other components within normal limits  CK - Abnormal; Notable for the following:    Total CK 2937 (*)    All other components within normal limits  URINE MICROSCOPIC-ADD ON - Abnormal; Notable for the following:    Bacteria, UA MANY (*)    All other components within normal limits  GLUCOSE, CAPILLARY - Abnormal; Notable for the following:    Glucose-Capillary 140 (*)    All other components within normal limits  CK - Abnormal; Notable for the following:    Total CK 2568 (*)    All other components within normal limits  CBC - Abnormal; Notable for the following:    RBC 3.86 (*)    Hemoglobin 11.5 (*)    HCT 34.4 (*)    All other components within normal limits  COMPREHENSIVE METABOLIC PANEL - Abnormal; Notable for the following:    Glucose, Bld 248 (*)    Creatinine, Ser 1.54 (*)    Calcium 7.9 (*)    Total Protein 5.7 (*)    Albumin 3.3 (*)    AST 62 (*)    GFR calc non Af Amer 46 (*)    GFR calc Af Amer 53 (*)    All other components within normal limits  GLUCOSE, CAPILLARY -  Abnormal; Notable for the following:    Glucose-Capillary 237 (*)    All other components within normal limits  CBC - Abnormal; Notable for the following:    RBC 3.84 (*)    Hemoglobin 11.2 (*)    HCT 35.0 (*)    All other components within normal limits  COMPREHENSIVE METABOLIC PANEL - Abnormal; Notable for the following:    Glucose, Bld 218 (*)  Creatinine, Ser 1.34 (*)    Calcium 8.2 (*)    Total Protein 6.1 (*)    Albumin 3.2 (*)    AST 55 (*)    GFR calc non Af Amer 54 (*)    All other components within normal limits  CK - Abnormal; Notable for the following:    Total CK 1833 (*)    All other components within normal limits  GLUCOSE, CAPILLARY - Abnormal; Notable for the following:    Glucose-Capillary 270 (*)    All other components within normal limits  GLUCOSE, CAPILLARY - Abnormal; Notable for the following:    Glucose-Capillary 264 (*)    All other components within normal limits  GLUCOSE, CAPILLARY - Abnormal; Notable for the following:    Glucose-Capillary 237 (*)    All other components within normal limits  GLUCOSE, CAPILLARY - Abnormal; Notable for the following:    Glucose-Capillary 211 (*)    All other components within normal limits  GLUCOSE, CAPILLARY - Abnormal; Notable for the following:    Glucose-Capillary 214 (*)    All other components within normal limits  GLUCOSE, CAPILLARY - Abnormal; Notable for the following:    Glucose-Capillary 145 (*)    All other components within normal limits  CK - Abnormal; Notable for the following:    Total CK 1078 (*)    All other components within normal limits  CBC - Abnormal; Notable for the following:    RBC 3.77 (*)    Hemoglobin 11.3 (*)    HCT 33.4 (*)    All other components within normal limits  BASIC METABOLIC PANEL - Abnormal; Notable for the following:    Glucose, Bld 120 (*)    BUN <5 (*)    Creatinine, Ser 1.25 (*)    Calcium 8.5 (*)    GFR calc non Af Amer 59 (*)    All other components  within normal limits  GLUCOSE, CAPILLARY - Abnormal; Notable for the following:    Glucose-Capillary 218 (*)    All other components within normal limits  GLUCOSE, CAPILLARY - Abnormal; Notable for the following:    Glucose-Capillary 120 (*)    All other components within normal limits  GLUCOSE, CAPILLARY - Abnormal; Notable for the following:    Glucose-Capillary 123 (*)    All other components within normal limits  CBG MONITORING, ED - Abnormal; Notable for the following:    Glucose-Capillary 184 (*)    All other components within normal limits  CULTURE, BLOOD (ROUTINE X 2)  CULTURE, BLOOD (ROUTINE X 2)  URINE CULTURE  RAPID STREP SCREEN (NOT AT [facility])  CULTURE, GROUP A STREP  INFLUENZA PANEL BY PCR (TYPE A & B, H1N1)  I-STAT CG4 LACTIC ACID, ED  I-STAT CG4 LACTIC ACID, ED    Imaging Review    DG Chest 2 View (Final result) Result time: 09/28/14 16:00:15   Final result by Rad Results In Interface (09/28/14 16:00:15)   Narrative:   CLINICAL DATA: Four day history of cough and fever ; congestion  EXAM: CHEST 2 VIEW  COMPARISON: July 15, 2014  FINDINGS: There is no edema or consolidation. The heart size and pulmonary vascularity are normal. No adenopathy. There is degenerative change in the thoracic spine.  IMPRESSION: No edema or consolidation.   Electronically Signed By: Lowella Grip III M.D. On: 09/28/2014 16:00     MDM   Final diagnoses:  Fever        Leonard Schwartz, MD 10/06/14 [phone]

## 2014-09-28 NOTE — ED Notes (Signed)
Pt states generalized aches x 3 days ago but getting worse.

## 2014-09-29 DIAGNOSIS — N390 Urinary tract infection, site not specified: Secondary | ICD-10-CM

## 2014-09-29 DIAGNOSIS — R509 Fever, unspecified: Secondary | ICD-10-CM

## 2014-09-29 DIAGNOSIS — M609 Myositis, unspecified: Secondary | ICD-10-CM

## 2014-09-29 DIAGNOSIS — I10 Essential (primary) hypertension: Secondary | ICD-10-CM

## 2014-09-29 DIAGNOSIS — E119 Type 2 diabetes mellitus without complications: Secondary | ICD-10-CM

## 2014-09-29 LAB — CBC
HCT: 34.4 % — ABNORMAL LOW (ref 39.0–52.0)
Hemoglobin: 11.5 g/dL — ABNORMAL LOW (ref 13.0–17.0)
MCH: 29.8 pg (ref 26.0–34.0)
MCHC: 33.4 g/dL (ref 30.0–36.0)
MCV: 89.1 fL (ref 78.0–100.0)
PLATELETS: 154 10*3/uL (ref 150–400)
RBC: 3.86 MIL/uL — AB (ref 4.22–5.81)
RDW: 15.2 % (ref 11.5–15.5)
WBC: 9.7 10*3/uL (ref 4.0–10.5)

## 2014-09-29 LAB — COMPREHENSIVE METABOLIC PANEL
ALK PHOS: 49 U/L (ref 38–126)
ALT: 53 U/L (ref 17–63)
AST: 62 U/L — ABNORMAL HIGH (ref 15–41)
Albumin: 3.3 g/dL — ABNORMAL LOW (ref 3.5–5.0)
Anion gap: 7 (ref 5–15)
BILIRUBIN TOTAL: 0.7 mg/dL (ref 0.3–1.2)
BUN: 11 mg/dL (ref 6–20)
CO2: 24 mmol/L (ref 22–32)
CREATININE: 1.54 mg/dL — AB (ref 0.61–1.24)
Calcium: 7.9 mg/dL — ABNORMAL LOW (ref 8.9–10.3)
Chloride: 106 mmol/L (ref 101–111)
GFR calc Af Amer: 53 mL/min — ABNORMAL LOW (ref >60)
GFR calc non Af Amer: 46 mL/min — ABNORMAL LOW (ref >60)
Glucose, Bld: 248 mg/dL — ABNORMAL HIGH (ref 65–99)
Potassium: 4.4 mmol/L (ref 3.5–5.1)
Sodium: 137 mmol/L (ref 135–145)
Total Protein: 5.7 g/dL — ABNORMAL LOW (ref 6.5–8.1)

## 2014-09-29 LAB — GLUCOSE, CAPILLARY
GLUCOSE-CAPILLARY: 140 mg/dL — AB (ref 65–99)
GLUCOSE-CAPILLARY: 270 mg/dL — AB (ref 65–99)
Glucose-Capillary: 237 mg/dL — ABNORMAL HIGH (ref 65–99)
Glucose-Capillary: 264 mg/dL — ABNORMAL HIGH (ref 65–99)
Glucose-Capillary: 237 mg/dL — ABNORMAL HIGH (ref 65–99)

## 2014-09-29 LAB — INFLUENZA PANEL BY PCR (TYPE A & B)
H1N1FLUPCR: NOT DETECTED
Influenza A By PCR: NEGATIVE
Influenza B By PCR: NEGATIVE

## 2014-09-29 LAB — CK: CK TOTAL: 2568 U/L — AB (ref 49–397)

## 2014-09-29 MED ORDER — OXYCODONE HCL 5 MG PO TABS
5.0000 mg | ORAL_TABLET | ORAL | Status: AC
  Administered 2014-09-29: 5 mg via ORAL
  Filled 2014-09-29: qty 1

## 2014-09-29 MED ORDER — ONDANSETRON 4 MG PO TBDP
4.0000 mg | ORAL_TABLET | Freq: Three times a day (TID) | ORAL | Status: DC | PRN
  Filled 2014-09-29: qty 1

## 2014-09-29 MED ORDER — ACETAMINOPHEN 325 MG PO TABS
650.0000 mg | ORAL_TABLET | ORAL | Status: DC | PRN
  Administered 2014-09-29: 650 mg via ORAL

## 2014-09-29 MED ORDER — ACETAMINOPHEN 325 MG PO TABS: 650.0000 mg | ORAL_TABLET | ORAL | Status: DC | PRN

## 2014-09-29 MED ORDER — DIPHENHYDRAMINE HCL 12.5 MG/5ML PO ELIX
12.5000 mg | ORAL_SOLUTION | Freq: Four times a day (QID) | ORAL | Status: DC | PRN
  Administered 2014-09-30: 12.5 mg via ORAL
  Filled 2014-09-29 (×2): qty 5

## 2014-09-29 MED ORDER — INSULIN ASPART 100 UNIT/ML ~~LOC~~ SOLN
0.0000 [IU] | Freq: Every day | SUBCUTANEOUS | Status: DC
  Administered 2014-09-29: 2 [IU] via SUBCUTANEOUS

## 2014-09-29 MED ORDER — INSULIN ASPART 100 UNIT/ML ~~LOC~~ SOLN
0.0000 [IU] | Freq: Three times a day (TID) | SUBCUTANEOUS | Status: DC
  Administered 2014-09-30 (×2): 7 [IU] via SUBCUTANEOUS
  Administered 2014-09-30 – 2014-10-01 (×2): 3 [IU] via SUBCUTANEOUS

## 2014-09-29 MED ORDER — OXYCODONE-ACETAMINOPHEN 5-325 MG PO TABS
1.0000 | ORAL_TABLET | Freq: Four times a day (QID) | ORAL | Status: DC | PRN
  Administered 2014-09-29 (×2): 2 via ORAL
  Administered 2014-09-30: 1 via ORAL
  Administered 2014-09-30: 2 via ORAL
  Filled 2014-09-29 (×3): qty 2
  Filled 2014-09-29: qty 1

## 2014-09-29 MED ORDER — ONDANSETRON HCL 4 MG/2ML IJ SOLN
4.0000 mg | Freq: Three times a day (TID) | INTRAMUSCULAR | Status: DC | PRN
Start: 2014-09-29 — End: 2014-10-01

## 2014-09-29 NOTE — Progress Notes (Signed)
[facility] PROGRESS NOTE  [name] [name]:[id number] DOB: 05/03/1950 DOA: 09/28/2014  PCP: Sees provider at [facility]  Brief HPI: 64 year old African-American male with past medical history of inflammatory myositis on methotrexate and prednisone, presented with 3 day history of sore throat, nonproductive cough, chills, fever with temperatures up to 103F. He was admitted for further management.  Past medical history:  Past Medical History  Diagnosis Date  . Diabetes mellitus without complication   . GERD (gastroesophageal reflux disease)   . Autoimmune disease     Consultants: None  Procedures: None  Antibiotics: Ceftriaxone 5/31  Subjective: Patient feels slightly better this morning. His wife is at bedside. Coughing up clear expectoration. Did have some burning sensation with urination yesterday. No nausea or vomiting. Denies abdominal pain. No skin rashes. No joint pains.  Objective: Vital Signs  Filed Vitals:   09/28/14 1900 09/28/14 2028 09/29/14 0519 09/29/14 1335  BP: 129/65 113/60 101/58 119/74  Pulse: 103 106 100 101  Temp:  102.1 F (38.9 C) 99.8 F (37.7 C) 99.5 F (37.5 C)  TempSrc:  Oral Oral Oral  Resp: 24 18 18 19   Height:  5' 8.5" (1.74 m)    Weight:  103.919 kg (229 lb 1.6 oz)    SpO2: 96% 98% 98% 100%    Intake/Output Summary (Last 24 hours) at 09/29/14 1614 Last data filed at 09/29/14 1139  Gross per 24 hour  Intake 939.17 ml  Output      0 ml  Net 939.17 ml   Filed Weights   09/28/14 1506 09/28/14 2028  Weight: 113.399 kg (250 lb) 103.919 kg (229 lb 1.6 oz)    General appearance: alert, cooperative, appears stated age and no distress Resp: clear to auscultation bilaterally Cardio: regular rate and rhythm, S1, S2 normal, no murmur, click, rub or gallop GI: soft, non-tender; bowel sounds normal; no masses,  no organomegaly Extremities: extremities normal, atraumatic, no cyanosis or edema Neurologic: Alert and oriented 3. No focal  neurological deficits are noted.  Lab Results:  Basic Metabolic Panel:  Recent Labs Lab 09/28/14 1615 09/29/14 1217  NA 134* 137  K 3.8 4.4  CL 102 106  CO2 24 24  GLUCOSE 186* 248*  BUN 13 11  CREATININE 1.63* 1.54*  CALCIUM 8.5* 7.9*   Liver Function Tests:  Recent Labs Lab 09/28/14 1615 09/29/14 1217  AST 68* 62*  ALT 60 53  ALKPHOS 59 49  BILITOT 0.4 0.7  PROT 6.7 5.7*  ALBUMIN 4.0 3.3*   CBC:  Recent Labs Lab 09/28/14 1615 09/29/14 1217  WBC 7.4 9.7  NEUTROABS 6.0  --   HGB 11.7* 11.5*  HCT 35.2* 34.4*  MCV 89.3 89.1  PLT 165 154   Cardiac Enzymes:  Recent Labs Lab 09/28/14 1615 09/29/14 1217  CKTOTAL 2937* 5638*   CBG:  Recent Labs Lab 09/28/14 1544 09/29/14 0802 09/29/14 1219  GLUCAP 184* 140* 237*    Recent Results (from the past 240 hour(s))  Culture, blood (routine x 2)     Status: None (Preliminary result)   Collection Time: 09/28/14  4:20 PM  Result Value Ref Range Status   Specimen Description BLOOD RIGHT HAND  Final   Special Requests BOTTLES DRAWN AEROBIC AND ANAEROBIC 5CC EACH  Final   Culture   Final           BLOOD CULTURE RECEIVED NO GROWTH TO DATE CULTURE WILL BE HELD FOR 5 DAYS BEFORE ISSUING A FINAL NEGATIVE REPORT Performed at [facility]    Report Status PENDING  Incomplete  Rapid strep screen (not at [facility])     Status: None   Collection Time: 09/28/14  4:30 PM  Result Value Ref Range Status   Streptococcus, Group A Screen (Direct) NEGATIVE NEGATIVE Final    Comment: (NOTE) A Rapid Antigen test may result negative if the antigen level in the sample is below the detection level of this test. The FDA has not cleared this test as a stand-alone test therefore the rapid antigen negative result has reflexed to a Group A Strep culture.   Culture, blood (routine x 2)     Status: None (Preliminary result)   Collection Time: 09/28/14  4:45 PM  Result Value Ref Range Status   Specimen Description BLOOD RIGHT  HAND  Final   Special Requests   Final    BOTTLES DRAWN AEROBIC AND ANAEROBIC 6CC BLUE,RED 5CC   Culture   Final           BLOOD CULTURE RECEIVED NO GROWTH TO DATE CULTURE WILL BE HELD FOR 5 DAYS BEFORE ISSUING A FINAL NEGATIVE REPORT Performed at [facility]    Report Status PENDING  Incomplete      Studies/Results: Dg Chest 2 View  09/28/2014   CLINICAL DATA:  Four day history of cough and fever ; congestion  EXAM: CHEST  2 VIEW  COMPARISON:  July 15, 2014  FINDINGS: There is no edema or consolidation. The heart size and pulmonary vascularity are normal. No adenopathy. There is degenerative change in the thoracic spine.  IMPRESSION: No edema or consolidation.   Electronically Signed   By: Lowella Grip III M.D.   On: 09/28/2014 16:00    Medications:  Scheduled: . acarbose  100 mg Oral TID WC  . aspirin EC  81 mg Oral Daily  . calcium carbonate  1,250 mg Oral BID WC  . cefTRIAXone (ROCEPHIN)  IV  1 g Intravenous Q24H  . fluticasone  2 spray Each Nare Daily  . folic acid  1 mg Oral Daily  . glipiZIDE  10 mg Oral Q breakfast  . heparin  5,000 Units Subcutaneous 3 times per day  . losartan  50 mg Oral Daily  . pantoprazole  40 mg Oral Daily  . predniSONE  5 mg Oral Q breakfast  . sertraline  100 mg Oral Daily   Continuous: . sodium chloride 75 mL/hr at 09/29/14 1401   UJW:JXBJYNWGNFAOZ, guaiFENesin-codeine, ondansetron (ZOFRAN) IV, oxyCODONE-acetaminophen  Assessment/Plan:  Active Problems:   Fever   SIRS (systemic inflammatory response syndrome)   Influenza-like illness   UTI (urinary tract infection)   Myositis   DM2 (diabetes mellitus, type 2)   HTN (hypertension)    Fever with early sepsis Workup so far has revealed only an abnormal UA. He did complain of dysuria yesterday. This could be the reason for his presentation. Continue with ceftriaxone. Follow the cultures are negative so far. Influenza PCR negative. Continue IV fluids. There are some  reports that he was recently started on methotrexate, which could have also contributed. This will need to be verified with the patient.  UTI Await urine culture. Continue with ceftriaxone.  History of inflammatory myositis Continue prednisone. CK levels noted to be elevated. Repeated today and noted to be improved. Hold off on methotrexate due to possible active infection. We will need to verify with patient and family as to when this was initiated.  History of diabetes mellitus type 2 Continue with his oral agents.  SSI.  History of essential hypertension Blood pressure is reasonably well controlled. Continue with home medications.  Elevated creatinine No old values available. He could have chronic kidney disease. Continue to monitor urine output.  DVT Prophylaxis: Subcutaneous heparin    Code Status: Full code  Family Communication: Discussed with the patient and his family  Disposition Plan: Continue current treatment. Will likely return home when improved.  Follow-up Appointment?: We'll need to closely follow up with his PCP after discharge regarding Methotrexate.   LOS: 1 day   [facility] Pager [phone] 09/29/2014, 4:14 PM  If 7PM-7AM, please contact night-coverage at [email or web address], password [email or web address]

## 2014-09-29 NOTE — Progress Notes (Signed)
Utilization review completed.  

## 2014-09-30 DIAGNOSIS — E1165 Type 2 diabetes mellitus with hyperglycemia: Secondary | ICD-10-CM

## 2014-09-30 LAB — COMPREHENSIVE METABOLIC PANEL
ALT: 53 U/L (ref 17–63)
ANION GAP: 10 (ref 5–15)
AST: 55 U/L — AB (ref 15–41)
Albumin: 3.2 g/dL — ABNORMAL LOW (ref 3.5–5.0)
Alkaline Phosphatase: 51 U/L (ref 38–126)
BILIRUBIN TOTAL: 0.5 mg/dL (ref 0.3–1.2)
BUN: 10 mg/dL (ref 6–20)
CO2: 23 mmol/L (ref 22–32)
Calcium: 8.2 mg/dL — ABNORMAL LOW (ref 8.9–10.3)
Chloride: 106 mmol/L (ref 101–111)
Creatinine, Ser: 1.34 mg/dL — ABNORMAL HIGH (ref 0.61–1.24)
GFR calc non Af Amer: 54 mL/min — ABNORMAL LOW (ref >60)
Glucose, Bld: 218 mg/dL — ABNORMAL HIGH (ref 65–99)
Potassium: 4.8 mmol/L (ref 3.5–5.1)
SODIUM: 139 mmol/L (ref 135–145)
Total Protein: 6.1 g/dL — ABNORMAL LOW (ref 6.5–8.1)

## 2014-09-30 LAB — CBC
HCT: 35 % — ABNORMAL LOW (ref 39.0–52.0)
Hemoglobin: 11.2 g/dL — ABNORMAL LOW (ref 13.0–17.0)
MCH: 29.2 pg (ref 26.0–34.0)
MCHC: 32 g/dL (ref 30.0–36.0)
MCV: 91.1 fL (ref 78.0–100.0)
Platelets: 158 10*3/uL (ref 150–400)
RBC: 3.84 MIL/uL — ABNORMAL LOW (ref 4.22–5.81)
RDW: 15.4 % (ref 11.5–15.5)
WBC: 8.7 10*3/uL (ref 4.0–10.5)

## 2014-09-30 LAB — URINE CULTURE

## 2014-09-30 LAB — GLUCOSE, CAPILLARY
GLUCOSE-CAPILLARY: 211 mg/dL — AB (ref 65–99)
Glucose-Capillary: 214 mg/dL — ABNORMAL HIGH (ref 65–99)
Glucose-Capillary: 145 mg/dL — ABNORMAL HIGH (ref 65–99)
Glucose-Capillary: 218 mg/dL — ABNORMAL HIGH (ref 65–99)
Glucose-Capillary: 120 mg/dL — ABNORMAL HIGH (ref 65–99)

## 2014-09-30 LAB — CULTURE, GROUP A STREP: Strep A Culture: NEGATIVE

## 2014-09-30 LAB — CK: Total CK: 1833 U/L — ABNORMAL HIGH (ref 49–397)

## 2014-09-30 MED ORDER — INSULIN GLARGINE 100 UNIT/ML ~~LOC~~ SOLN
25.0000 [IU] | Freq: Two times a day (BID) | SUBCUTANEOUS | Status: DC
  Administered 2014-09-30 – 2014-10-01 (×3): 25 [IU] via SUBCUTANEOUS
  Filled 2014-09-30 (×4): qty 0.25

## 2014-09-30 NOTE — Progress Notes (Signed)
[facility] PROGRESS NOTE  Georgios Sligh XBM:[id number] DOB: [birth year] DOA: 09/28/2014  PCP: Sees provider at [facility]  Brief HPI: 64 year old African-American male with past medical history of inflammatory myositis on methotrexate and prednisone, presented with 3 day history of sore throat, nonproductive cough, chills, fever with temperatures up to 103F. He was admitted for further management.  Past medical history:  Past Medical History  Diagnosis Date  . Diabetes mellitus without complication   . GERD (gastroesophageal reflux disease)   . Autoimmune disease     Consultants: None  Procedures: None  Antibiotics: Ceftriaxone 5/31  Subjective: Patient feels much better this morning. His wife is at bedside. Cough is better. Had a spell yesterday after getting Percocet when he was very jittery and started itching. Happened again overnight.   Objective: Vital Signs  Filed Vitals:   09/29/14 0519 09/29/14 1335 09/29/14 2207 09/30/14 0558  BP: 101/58 119/74 104/64 139/67  Pulse: 100 101 95 69  Temp: 99.8 F (37.7 C) 99.5 F (37.5 C) 98.2 F (36.8 C) 97.7 F (36.5 C)  TempSrc: Oral Oral Oral Oral  Resp: 18 19 18 20   Height:      Weight:      SpO2: 98% 100% 95% 96%    Intake/Output Summary (Last 24 hours) at 09/30/14 1030 Last data filed at 09/30/14 0600  Gross per 24 hour  Intake 2696.67 ml  Output    620 ml  Net 2076.67 ml   Filed Weights   09/28/14 1506 09/28/14 2028  Weight: 113.399 kg (250 lb) 103.919 kg (229 lb 1.6 oz)    General appearance: alert, cooperative, appears stated age and no distress Resp: Good air entry bilaterally. No crackles, wheezing or rhonchi. Cardio: regular rate and rhythm, S1, S2 normal, no murmur, click, rub or gallop GI: soft, non-tender; bowel sounds normal; no masses,  no organomegaly Extremities: extremities normal, atraumatic, no cyanosis or edema Neurologic: Alert and oriented 3. No focal neurological deficits are  noted.  Lab Results:  Basic Metabolic Panel:  Recent Labs Lab 09/28/14 1615 09/29/14 1217 09/30/14 0700  NA 134* 137 139  K 3.8 4.4 4.8  CL 102 106 106  CO2 24 24 23   GLUCOSE 186* 248* 218*  BUN 13 11 10   CREATININE 1.63* 1.54* 1.34*  CALCIUM 8.5* 7.9* 8.2*   Liver Function Tests:  Recent Labs Lab 09/28/14 1615 09/29/14 1217 09/30/14 0700  AST 68* 62* 55*  ALT 60 53 53  ALKPHOS 59 49 51  BILITOT 0.4 0.7 0.5  PROT 6.7 5.7* 6.1*  ALBUMIN 4.0 3.3* 3.2*   CBC:  Recent Labs Lab 09/28/14 1615 09/29/14 1217 09/30/14 0700  WBC 7.4 9.7 8.7  NEUTROABS 6.0  --   --   HGB 11.7* 11.5* 11.2*  HCT 35.2* 34.4* 35.0*  MCV 89.3 89.1 91.1  PLT 165 154 158   Cardiac Enzymes:  Recent Labs Lab 09/28/14 1615 09/29/14 1217 09/30/14 0700  CKTOTAL 2937* 0272* 1833*   CBG:  Recent Labs Lab 09/29/14 1701 09/29/14 1836 09/29/14 2210 09/30/14 0131 09/30/14 0735  GLUCAP 270* 264* 237* 211* 214*    Recent Results (from the past 240 hour(s))  Urine culture     Status: None (Preliminary result)   Collection Time: 09/28/14  3:55 PM  Result Value Ref Range Status   Specimen Description URINE, CLEAN CATCH  Final   Special Requests Immunocompromised  Final   Colony Count   Final    >=100,000 COLONIES/ML Performed at [facility]  Culture   Final    GRAM NEGATIVE RODS Performed at [facility]    Report Status PENDING  Incomplete  Culture, blood (routine x 2)     Status: None (Preliminary result)   Collection Time: 09/28/14  4:20 PM  Result Value Ref Range Status   Specimen Description BLOOD RIGHT HAND  Final   Special Requests BOTTLES DRAWN AEROBIC AND ANAEROBIC 5CC EACH  Final   Culture   Final           BLOOD CULTURE RECEIVED NO GROWTH TO DATE CULTURE WILL BE HELD FOR 5 DAYS BEFORE ISSUING A FINAL NEGATIVE REPORT Performed at [facility]    Report Status PENDING  Incomplete  Rapid strep screen (not at [facility])     Status: None    Collection Time: 09/28/14  4:30 PM  Result Value Ref Range Status   Streptococcus, Group A Screen (Direct) NEGATIVE NEGATIVE Final    Comment: (NOTE) A Rapid Antigen test may result negative if the antigen level in the sample is below the detection level of this test. The FDA has not cleared this test as a stand-alone test therefore the rapid antigen negative result has reflexed to a Group A Strep culture.   Culture, blood (routine x 2)     Status: None (Preliminary result)   Collection Time: 09/28/14  4:45 PM  Result Value Ref Range Status   Specimen Description BLOOD RIGHT HAND  Final   Special Requests   Final    BOTTLES DRAWN AEROBIC AND ANAEROBIC 6CC BLUE,RED 5CC   Culture   Final           BLOOD CULTURE RECEIVED NO GROWTH TO DATE CULTURE WILL BE HELD FOR 5 DAYS BEFORE ISSUING A FINAL NEGATIVE REPORT Performed at [facility]    Report Status PENDING  Incomplete      Studies/Results: Dg Chest 2 View  09/28/2014   CLINICAL DATA:  Four day history of cough and fever ; congestion  EXAM: CHEST  2 VIEW  COMPARISON:  July 15, 2014  FINDINGS: There is no edema or consolidation. The heart size and pulmonary vascularity are normal. No adenopathy. There is degenerative change in the thoracic spine.  IMPRESSION: No edema or consolidation.   Electronically Signed   By: Lowella Grip III M.D.   On: 09/28/2014 16:00    Medications:  Scheduled: . acarbose  100 mg Oral TID WC  . aspirin EC  81 mg Oral Daily  . calcium carbonate  1,250 mg Oral BID WC  . cefTRIAXone (ROCEPHIN)  IV  1 g Intravenous Q24H  . fluticasone  2 spray Each Nare Daily  . folic acid  1 mg Oral Daily  . glipiZIDE  10 mg Oral Q breakfast  . heparin  5,000 Units Subcutaneous 3 times per day  . insulin aspart  0-20 Units Subcutaneous TID WC  . insulin aspart  0-5 Units Subcutaneous QHS  . insulin glargine  25 Units Subcutaneous BID  . losartan  50 mg Oral Daily  . pantoprazole  40 mg Oral Daily  .  predniSONE  5 mg Oral Q breakfast  . sertraline  100 mg Oral Daily   Continuous: . sodium chloride 75 mL/hr at 09/30/14 0749   NLG:XQJJHERDEYCXK, diphenhydrAMINE, guaiFENesin-codeine, ondansetron (ZOFRAN) IV, ondansetron, oxyCODONE-acetaminophen  Assessment/Plan:  Active Problems:   Fever   SIRS (systemic inflammatory response syndrome)   Influenza-like illness   UTI (urinary tract infection)   Myositis   DM2 (  diabetes mellitus, type 2)   HTN (hypertension)    Fever with early sepsis Workup so far has revealed that he has a UTI. He did have some dysuria. This could be the reason for his presentation. Continue with ceftriaxone.  Influenza PCR negative. Blood cultures are negative so far. Urine culture is growing gram-negative large. Continue IV fluids but cut down rate. There are some reports that he was recently started on methotrexate. This was discussed with the patient and his wife. Apparently, he was started on methotrexate about a year ago. Recently the dose was increased.  UTI Gram-negative rods on urine culture. Final identification and sensitivities pending. Continue with ceftriaxone.  History of inflammatory myositis Continue prednisone. CK levels noted to be elevated, but trending down. Hold off on methotrexate due to active infection.   History of diabetes mellitus type 2 Blood sugars running high. He is on Lantus at home which we've initiated. Continue SSI.  History of essential hypertension Blood pressure is reasonably well controlled. Continue with home medications.  Elevated creatinine No old values available. He could have chronic kidney disease. Continue to monitor urine output. Creatinine is improved.  DVT Prophylaxis: Subcutaneous heparin    Code Status: Full code  Family Communication: Discussed with the patient and his family  Disposition Plan: Continue current treatment. Will likely return home when improved. Mobilize. Anticipate discharge  tomorrow.     LOS: 2 days   [facility] Pager [phone] 09/30/2014, 10:30 AM  If 7PM-7AM, please contact night-coverage at [email or web address], password [facility]

## 2014-09-30 NOTE — Care Management Note (Signed)
Case Management Note  Patient Details  Name: Fremon Zacharia MRN: [id number] Date of Birth: [birth year]  Subjective/Objective:   Patient lives with spouse, no needs identified at this time.                 Action/Plan:   Expected Discharge Date:                  Expected Discharge Plan:  Home/Self Care  In-House Referral:     Discharge planning Services  CM Consult  Post Acute Care Choice:    Choice offered to:     DME Arranged:    DME Agency:     HH Arranged:    [place] Agency:     Status of Service:  Completed, signed off  Medicare Important Message Given:  Yes Date Medicare IM Given:  09/30/14 Medicare IM give by:  Tomi Bamberger RN Date Additional Medicare IM Given:    Additional Medicare Important Message give by:     If discussed at [place] of Stay Meetings, dates discussed:    Additional Comments:  Zenon Mayo, RN 09/30/2014, 12:15 PM

## 2014-10-01 LAB — BASIC METABOLIC PANEL
Anion gap: 10 (ref 5–15)
BUN: <5 mg/dL — ABNORMAL LOW (ref 6–20)
CO2: 23 mmol/L (ref 22–32)
CREATININE: 1.25 mg/dL — AB (ref 0.61–1.24)
Calcium: 8.5 mg/dL — ABNORMAL LOW (ref 8.9–10.3)
Chloride: 106 mmol/L (ref 101–111)
GFR calc Af Amer: >60 mL/min (ref >60)
GFR calc non Af Amer: 59 mL/min — ABNORMAL LOW (ref >60)
Glucose, Bld: 120 mg/dL — ABNORMAL HIGH (ref 65–99)
POTASSIUM: 4.2 mmol/L (ref 3.5–5.1)
Sodium: 139 mmol/L (ref 135–145)

## 2014-10-01 LAB — CBC
HCT: 33.4 % — ABNORMAL LOW (ref 39.0–52.0)
Hemoglobin: 11.3 g/dL — ABNORMAL LOW (ref 13.0–17.0)
MCH: 30 pg (ref 26.0–34.0)
MCHC: 33.8 g/dL (ref 30.0–36.0)
MCV: 88.6 fL (ref 78.0–100.0)
Platelets: 151 10*3/uL (ref 150–400)
RBC: 3.77 MIL/uL — AB (ref 4.22–5.81)
RDW: 15.3 % (ref 11.5–15.5)
WBC: 7.3 10*3/uL (ref 4.0–10.5)

## 2014-10-01 LAB — CK: CK TOTAL: 1078 U/L — AB (ref 49–397)

## 2014-10-01 LAB — GLUCOSE, CAPILLARY: Glucose-Capillary: 123 mg/dL — ABNORMAL HIGH (ref 65–99)

## 2014-10-01 MED ORDER — INSULIN GLARGINE 100 UNIT/ML ~~LOC~~ SOLN
46.0000 [IU] | Freq: Every day | SUBCUTANEOUS | Status: DC
Start: 2014-10-01 — End: 2022-03-24

## 2014-10-01 MED ORDER — CEPHALEXIN 500 MG PO CAPS
500.0000 mg | ORAL_CAPSULE | Freq: Three times a day (TID) | ORAL | Status: DC
  Administered 2014-10-01: 500 mg via ORAL
  Filled 2014-10-01 (×3): qty 1

## 2014-10-01 MED ORDER — METHOTREXATE SODIUM 5 MG PO TABS: 10.0000 mg | ORAL_TABLET | ORAL | Status: DC

## 2014-10-01 MED ORDER — CEPHALEXIN 500 MG PO CAPS: 500.0000 mg | ORAL_CAPSULE | Freq: Three times a day (TID) | ORAL | Status: DC

## 2014-10-01 NOTE — Discharge Summary (Signed)
[facility]  Physician Discharge Summary   Patient ID: Rody Keadle MRN: [id number] DOB/AGE: [birth year] 64 y.o.  Admit date: 09/28/2014 Discharge date: 10/01/2014  PCP: at the [place]:  Active Problems:   Fever   SIRS (systemic inflammatory response syndrome)   Influenza-like illness   UTI (urinary tract infection)   Myositis   DM2 (diabetes mellitus, type 2)   HTN (hypertension)   RECOMMENDATIONS FOR OUTPATIENT FOLLOW UP: 1. Methotrexate was held this week due to active infection. Okay to resume next week.  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 He was admitted for further management.  HOSPITAL COURSE:   Fever with early sepsis Workup so far has revealed that he has a UTI. He did have some dysuria. This is the most likely reason for his presentation. He was started on ceftriaxone. Urine cultures grew out Citrobacter. He was changed over to oral antibiotics. Influenza PCR negative. Blood cultures are negative so far.  Apparently, he was started on methotrexate about a year ago. Recently the dose was increased.   UTI with Citrobacter Initially treated with ceftriaxone. Changed to Keflex based on sensitivities.  History of inflammatory myositis CK levels noted to be elevated, but trending down. Continue prednisone. Okay to resume methotrexate from next week.    History of diabetes mellitus type 2 2. Resume his Lantus at a lower dose. 2. Increase as his oral intake improves. He needs to check his blood sugars at home.   History of essential hypertension Blood pressure is reasonably well  controlled. Continue with home medications.  Elevated creatinine Improved with hydration. No old values available. He could have chronic kidney disease.   Overall, patient is feeling much better. Okay for discharge home.   PERTINENT LABS:  The results of significant diagnostics from this hospitalization (including imaging, microbiology, ancillary and laboratory) are listed below for reference.    Microbiology: Recent Results (from the past 240 hour(s))  Urine culture     Status: None   Collection Time: 09/28/14  3:55 PM  Result Value Ref Range Status   Specimen Description URINE, CLEAN CATCH  Final   Special Requests Immunocompromised  Final   Colony Count   Final    >=100,000 COLONIES/ML Performed at [facility]    Culture   Final    CITROBACTER KOSERI Performed at [facility]    Report Status 09/30/2014 FINAL  Final   Organism ID, Bacteria CITROBACTER KOSERI  Final      Susceptibility   Citrobacter koseri - MIC*    CEFAZOLIN <=4 SENSITIVE Sensitive     CEFTRIAXONE <=1 SENSITIVE Sensitive     CIPROFLOXACIN <=0.25 SENSITIVE Sensitive     GENTAMICIN <=1 SENSITIVE Sensitive     LEVOFLOXACIN <=0.12 SENSITIVE Sensitive     NITROFURANTOIN 32 SENSITIVE Sensitive     TOBRAMYCIN <=1 SENSITIVE Sensitive     TRIMETH/SULFA <=20 SENSITIVE Sensitive     PIP/TAZO <=4 SENSITIVE Sensitive     * CITROBACTER KOSERI  Culture, blood (routine x 2)     Status: None (Preliminary result)   Collection Time: 09/28/14  4:20 PM  Result Value Ref Range Status  Specimen Description BLOOD RIGHT HAND  Final   Special Requests BOTTLES DRAWN AEROBIC AND ANAEROBIC 5CC EACH  Final   Culture   Final           BLOOD CULTURE RECEIVED NO GROWTH TO DATE CULTURE WILL BE HELD FOR 5 DAYS BEFORE ISSUING A FINAL NEGATIVE REPORT Performed at [facility]    Report Status PENDING  Incomplete  Rapid strep screen (not at [facility])     Status: None   Collection Time: 09/28/14  4:30 PM    Result Value Ref Range Status   Streptococcus, Group A Screen (Direct) NEGATIVE NEGATIVE Final    Comment: (NOTE) A Rapid Antigen test may result negative if the antigen level in the sample is below the detection level of this test. The FDA has not cleared this test as a stand-alone test therefore the rapid antigen negative result has reflexed to a Group A Strep culture.   Culture, Group A Strep     Status: None   Collection Time: 09/28/14  4:30 PM  Result Value Ref Range Status   Strep A Culture Negative  Final    Comment: (NOTE) Performed At: [facility] [place], [place] [id number] Lindon Romp MD QA:[id number]   Culture, blood (routine x 2)     Status: None (Preliminary result)   Collection Time: 09/28/14  4:45 PM  Result Value Ref Range Status   Specimen Description BLOOD RIGHT HAND  Final   Special Requests   Final    BOTTLES DRAWN AEROBIC AND ANAEROBIC 6CC BLUE,RED 5CC   Culture   Final           BLOOD CULTURE RECEIVED NO GROWTH TO DATE CULTURE WILL BE HELD FOR 5 DAYS BEFORE ISSUING A FINAL NEGATIVE REPORT Performed at [facility]    Report Status PENDING  Incomplete     Labs: Basic Metabolic Panel:  Recent Labs Lab 09/28/14 1615 09/29/14 1217 09/30/14 0700 10/01/14 0726  NA 134* 137 139 139  K 3.8 4.4 4.8 4.2  CL 102 106 106 106  CO2 24 24 23 23   GLUCOSE 186* 248* 218* 120*  BUN 13 11 10  <5*  CREATININE 1.63* 1.54* 1.34* 1.25*  CALCIUM 8.5* 7.9* 8.2* 8.5*   Liver Function Tests:  Recent Labs Lab 09/28/14 1615 09/29/14 1217 09/30/14 0700  AST 68* 62* 55*  ALT 60 53 53  ALKPHOS 59 49 51  BILITOT 0.4 0.7 0.5  PROT 6.7 5.7* 6.1*  ALBUMIN 4.0 3.3* 3.2*   CBC:  Recent Labs Lab 09/28/14 1615 09/29/14 1217 09/30/14 0700 10/01/14 0726  WBC 7.4 9.7 8.7 7.3  NEUTROABS 6.0  --   --   --   HGB 11.7* 11.5* 11.2* 11.3*  HCT 35.2* 34.4* 35.0* 33.4*  MCV 89.3 89.1 91.1 88.6  PLT 165 154 158 151   Cardiac  Enzymes:  Recent Labs Lab 09/28/14 1615 09/29/14 1217 09/30/14 0700 10/01/14 0726  CKTOTAL 2937* 2568* 1833* 1078*   CBG:  Recent Labs Lab 09/30/14 0735 09/30/14 1139 09/30/14 1715 09/30/14 2118 10/01/14 0755  GLUCAP 214* 145* 218* 120* 123*     IMAGING STUDIES Dg Chest 2 View  09/28/2014   CLINICAL DATA:  Four day history of cough and fever ; congestion  EXAM: CHEST  2 VIEW  COMPARISON:  July 15, 2014  FINDINGS: There is no edema or consolidation. The heart size and pulmonary vascularity are normal. No adenopathy. There is degenerative change in the thoracic  spine.  IMPRESSION: No edema or consolidation.   Electronically Signed   By: Lowella Grip III M.D.   On: 09/28/2014 16:00    DISCHARGE EXAMINATION: Filed Vitals:   09/30/14 1405 09/30/14 2121 10/01/14 0549 10/01/14 0934  BP: 133/72 144/65 141/79 152/87  Pulse: 76 91 97 90  Temp: 98.5 F (36.9 C) 97.6 F (36.4 C) 98.2 F (36.8 C)   TempSrc: Oral Oral Oral   Resp: 20 20 18    Height:      Weight:      SpO2: 97% 98% 98%    General appearance: alert, cooperative, appears stated age and no distress Resp: clear to auscultation bilaterally Cardio: regular rate and rhythm, S1, S2 normal, no murmur, click, rub or gallop GI: soft, non-tender; bowel sounds normal; no masses,  no organomegaly Extremities: extremities normal, atraumatic, no cyanosis or edema Neurologic: Alert and oriented X 3, normal strength and tone. Normal symmetric reflexes. Normal coordination and gait  DISPOSITION: Home  Discharge Instructions    Call MD for:  difficulty breathing, headache or visual disturbances    Complete by:  As directed      Call MD for:  extreme fatigue    Complete by:  As directed      Call MD for:  persistant dizziness or light-headedness    Complete by:  As directed      Call MD for:  persistant nausea and vomiting    Complete by:  As directed      Call MD for:  severe uncontrolled pain    Complete by:  As  directed      Call MD for:  temperature >100.4    Complete by:  As directed      Diet Carb Modified    Complete by:  As directed      Discharge instructions    Complete by:  As directed   Please follow instructions regarding insulin and methotrexate. Please check your blood sugars at home three times a day. Please follow up with your PCP next week.  You were cared for by a hospitalist during your hospital stay. If you have any questions about your discharge medications or the care you received while you were in the hospital after you are discharged, you can call the unit and asked to speak with the hospitalist on call if the hospitalist that took care of you is not available. Once you are discharged, your primary care physician will handle any further medical issues. Please note that NO REFILLS for any discharge medications will be authorized once you are discharged, as it is imperative that you return to your primary care physician (or establish a relationship with a primary care physician if you do not have one) for your aftercare needs so that they can reassess your need for medications and monitor your lab values. If you do not have a primary care physician, you can call [phone] for a physician referral.     Increase activity slowly    Complete by:  As directed            ALLERGIES:  Allergies  Allergen Reactions  . Hydrocodone   . Shellfish Allergy   . Ultram [Tramadol]     "shakes"     Discharge Medication List as of 10/01/2014 11:17 AM    START taking these medications   Details  cephALEXin (KEFLEX) 500 MG capsule Take 1 capsule (500 mg total) by mouth every 8 (eight) hours. FOR 5 DAYS STARTING TONIGHT  AT 9-10PM., Starting 10/01/2014, Until Discontinued, Print      CONTINUE these medications which have CHANGED   Details  insulin glargine (LANTUS) 100 UNIT/ML injection Inject 0.46-0.56 mLs (46-56 Units total) into the skin at bedtime. For now take 30 units twice a day and then  increase to your usual dose as your oral intake improves. Increase both doses by 2units every day. Check your blood sugars at home three times d aily. Call your doctor if sugar level is more than 300 or less than 80., Starting 10/01/2014, Until Discontinued, Print    methotrexate (RHEUMATREX) 5 MG tablet Take 2 tablets (10 mg total) by mouth once a week. Caution: Chemotherapy. Protect from light. Take on fridays per patient.  OKAY TO RESUME FROM 10/08/14., Starting 10/01/2014, Until Discontinued, No Print      CONTINUE these medications which have NOT CHANGED   Details  acarbose (PRECOSE) 100 MG tablet Take 100 mg by mouth 3 (three) times daily with meals., Until Discontinued, Historical Med    aspirin 81 MG tablet Take 81 mg by mouth daily., Until Discontinued, Historical Med    calcium carbonate (OS-CAL) 600 MG TABS tablet Take 600 mg by mouth 2 (two) times daily with a meal., Until Discontinued, Historical Med    fluticasone (FLONASE) 50 MCG/ACT nasal spray Place 2 sprays into both nostrils daily., Starting 07/15/2014, Until Discontinued, Print    folic acid (FOLVITE) 1 MG tablet Take 1 mg by mouth daily., Until Discontinued, Historical Med    glipiZIDE (GLUCOTROL XL) 10 MG 24 hr tablet Take 10 mg by mouth daily with breakfast., Until Discontinued, Historical Med    losartan (COZAAR) 50 MG tablet Take 50 mg by mouth daily., Until Discontinued, Historical Med    omeprazole (PRILOSEC) 20 MG capsule Take 20 mg by mouth daily., Until Discontinued, Historical Med    predniSONE (DELTASONE) 5 MG tablet Take 5 mg by mouth daily with breakfast., Until Discontinued, Historical Med    sertraline (ZOLOFT) 100 MG tablet Take 100 mg by mouth daily., Until Discontinued, Historical Med      STOP taking these medications     guaiFENesin-codeine 100-10 MG/5ML syrup        Follow-up Information    Follow up with Primary Care Physician.   Why:  Please follow up next week.      TOTAL DISCHARGE  TIME: 35 minutes  [facility] Pager [phone]  10/01/2014, 3:32 PM

## 2014-10-01 NOTE — Progress Notes (Signed)
Utilization Review completed. Casmer Yepiz RN BSN CM 

## 2014-10-01 NOTE — Progress Notes (Signed)
[name] to be D/[name]'d Home per MD order.  Discussed with the patient and all questions fully answered.  VSS, Skin clean, dry and intact without evidence of skin break down, no evidence of skin tears noted. IV catheter discontinued intact. Site without signs and symptoms of complications. Dressing and pressure applied.  An After Visit Summary was printed and given to the patient. Patient received prescription.  D/[name] education completed with patient/family including follow up instructions, medication list, d/[name] activities limitations if indicated, with other d/[name] instructions as indicated by MD - patient able to verbalize understanding, all questions fully answered.   Patient instructed to return to ED, call 911, or call MD for any changes in condition.   D/[name] via private auto  [place]PER[place], [name] 10/01/2014 11:14 AM

## 2014-10-01 NOTE — Discharge Instructions (Signed)

## 2014-10-04 LAB — CULTURE, BLOOD (ROUTINE X 2)
Culture: NO GROWTH
Culture: NO GROWTH

## 2014-12-21 ENCOUNTER — Encounter: Payer: Self-pay | Admitting: Neurology

## 2014-12-21 ENCOUNTER — Ambulatory Visit (INDEPENDENT_AMBULATORY_CARE_PROVIDER_SITE_OTHER): Payer: Medicare Other | Admitting: Neurology

## 2014-12-21 VITALS — BP 156/94 | HR 74 | Ht 68.5 in | Wt 233.0 lb

## 2014-12-21 DIAGNOSIS — G7249 Other inflammatory and immune myopathies, not elsewhere classified: Secondary | ICD-10-CM

## 2014-12-21 NOTE — Progress Notes (Signed)
PATIENT: [name] DOB: May 16, 1950  Chief Complaint  Patient presents with  . Myopathy    He was sent here to have his worsening myopathy evaluated.  He has weakness and numbness in both his upper and lower extremities.  Some days are better than others.  At times, he has gait difficulty and decreased ability to grip or hold items.     HISTORICAL  Kariem Pender is a 64 year old right-handed male, seen in refer by  [facility] for evaluation of his inflammatory myopathy, he has been under [place] rheumatologist Dr. Consuello Closs, [place] care.  He had a past medical history of PTSD, insulin-dependent diabetes, hypertension hypothyroidism, on supplement.  He was admitted to hospital in June 2016 for UTI, do not hospital admission, he also complains of fever, whole body achy pain, CPK level was 1 8 3  3, he was taking methotrexate 2.5 milligrams 8 tablets every 7 days, also prednisone 5 mg daily  He reported a history of inflammatory myopathy since 2015, presented with diffuse muscle achiness, proximal muscle weakness, difficulty getting up from low sitting position, difficulty raising arm overhead at its worst, diagnosis was confirmed by abnormal laboratory evaluation, and also left deltoid muscle biopsy, this was done at the [facility],  Over the past 1-2 years, he has been treated with tapering dose of prednisone and methotrexate,  he had significant improvement, he is now denies significant muscle pain, no gait difficulty, no arm weakness, no visual loss, no swallowing difficulty.  He had laboratory evaluation at the [place] just few days ago  REVIEW OF SYSTEMS: Full 14 system review of systems performed and notable only for swelling in legs, joints pain, achy muscles, headaches, numbness, weakness  ALLERGIES: Allergies  Allergen Reactions  . Hydrocodone   . Shellfish Allergy   . Ultram [Tramadol]     "shakes"    HOME MEDICATIONS: Current Outpatient Prescriptions  Medication Sig  Dispense Refill  . acarbose (PRECOSE) 100 MG tablet Take 100 mg by mouth 3 (three) times daily with meals.    Marland Kitchen aspirin 81 MG tablet Take 81 mg by mouth daily.    . calcium carbonate (OS-CAL) 600 MG TABS tablet Take 600 mg by mouth 2 (two) times daily with a meal.    . fluticasone (FLONASE) 50 MCG/ACT nasal spray Place 2 sprays into both nostrils daily. 16 g 0  . folic acid (FOLVITE) 1 MG tablet Take 1 mg by mouth daily.    Marland Kitchen glipiZIDE (GLUCOTROL XL) 10 MG 24 hr tablet Take 10 mg by mouth daily with breakfast.    . insulin glargine (LANTUS) 100 UNIT/ML injection Inject 0.46-0.56 mLs (46-56 Units total) into the skin at bedtime. For now take 30 units twice a day and then increase to your usual dose as your oral intake improves. Increase both doses by 2units every day. Check your blood sugars at home three times daily. Call your doctor if sugar level is more than 300 or less than 80. 10 mL 11  . losartan (COZAAR) 50 MG tablet Take 50 mg by mouth daily.    . methotrexate (RHEUMATREX) 5 MG tablet Take 2 tablets (10 mg total) by mouth once a week. Caution: Chemotherapy. Protect from light. Take on fridays per patient.  OKAY TO RESUME FROM 10/08/14. 4 tablet 0  . omeprazole (PRILOSEC) 20 MG capsule Take 20 mg by mouth daily.    . predniSONE (DELTASONE) 5 MG tablet Take 5 mg by mouth daily with breakfast.    .  sertraline (ZOLOFT) 100 MG tablet Take 100 mg by mouth daily.     No current facility-administered medications for this visit.    PAST MEDICAL HISTORY: Past Medical History  Diagnosis Date  . Diabetes mellitus without complication   . GERD (gastroesophageal reflux disease)   . Autoimmune disease   . PTSD (post-traumatic stress disorder)   . Joint pain     PAST SURGICAL HISTORY: Past Surgical History  Procedure Laterality Date  . Biopsy shoulder      Left    FAMILY HISTORY: Family History  Problem Relation Age of Onset  . Heart attack Father   . Hypertension Mother   . Diabetes  Mother     SOCIAL HISTORY:  Social History   Social History  . Marital Status: Married    Spouse Name: N/A  . Number of Children: 4  . Years of Education: 16   Occupational History  . Retired    Social History Main Topics  . Smoking status: Never Smoker   . Smokeless tobacco: Never Used  . Alcohol Use: No  . Drug Use: No  . Sexual Activity: Not on file   Other Topics Concern  . Not on file   Social History Narrative   Lives at home with his wife and son.   Right-handed.   2 cups caffeine per day.     PHYSICAL EXAM   Filed Vitals:   12/21/14 1337  BP: 156/94  Pulse: 74  Height: 5' 8.5" (1.74 m)  Weight: 233 lb (105.688 kg)    Not recorded      Body mass index is 34.91 kg/(m^2).  PHYSICAL EXAMNIATION:  Gen: NAD, conversant, well nourised, obese, well groomed                     Cardiovascular: Regular rate rhythm, no peripheral edema, warm, nontender. Eyes: Conjunctivae clear without exudates or hemorrhage Neck: Supple, no carotid bruise. Pulmonary: Clear to auscultation bilaterally   NEUROLOGICAL EXAM:  MENTAL STATUS: Speech:    Speech is normal; fluent and spontaneous with normal comprehension.  Cognition:     Orientation to time, place and person     Normal recent and remote memory     Normal Attention span and concentration     Normal Language, naming, repeating,spontaneous speech     Fund of knowledge   CRANIAL NERVES: CN II: Visual fields are full to confrontation. Fundoscopic exam is normal with sharp discs and no vascular changes. Pupils are round equal and briskly reactive to light. CN III, IV, VI: extraocular movement are normal. No ptosis. CN V: Facial sensation is intact to pinprick in all 3 divisions bilaterally. Corneal responses are intact.  CN VII: Face is symmetric with normal eye closure and smile. CN VIII: Hearing is normal to rubbing fingers CN IX, X: Palate elevates symmetrically. Phonation is normal. CN XI: Head turning  and shoulder shrug are intact CN XII: Tongue is midline with normal movements and no atrophy.  MOTOR: There is no pronator drift of out-stretched arms. Muscle bulk and tone are normal. Muscle strength is normal.  REFLEXES: Reflexes are 2+ and symmetric at the biceps, triceps, knees, and ankles. Plantar responses are flexor.  SENSORY: Intact to light touch, pinprick, position sense, and vibration sense are intact in fingers and toes.  COORDINATION: Rapid alternating movements and fine finger movements are intact. There is no dysmetria on finger-to-nose and heel-knee-shin.    GAIT/STANCE: Posture is normal. He is able to get  up from seated position arms crossed, Gait is steady with normal steps, base, arm swing, and turning. Heel and toe walking are normal. Tandem gait is normal.  Romberg is absent.   DIAGNOSTIC DATA (LABS, IMAGING, TESTING) - I reviewed patient records, labs, notes, testing and imaging myself where available.   ASSESSMENT AND PLAN  Rexford Gum is a 64 y.o. male   Inflammatory myopathy  EMG nerve conduction study  Laboratory evaluation from primary care office  Keep current dose of methotrexate 2.5 milligrams 4 tablets twice each week, prednisone 5 mg daily  Medical record from [place]  Marcial Pacas, M.D. Ph.D.  [facility] [address] [place], [place] [address] Ph: [phone] Fax: [phone]  CC: Dr.Palasani, Indopriya, [phone]  4/4 [phone].

## 2015-01-31 ENCOUNTER — Encounter: Payer: Non-veteran care | Admitting: Neurology

## 2015-03-25 DIAGNOSIS — H34832 Tributary (branch) retinal vein occlusion, left eye, with macular edema: Secondary | ICD-10-CM | POA: Insufficient documentation

## 2015-03-25 DIAGNOSIS — H2513 Age-related nuclear cataract, bilateral: Secondary | ICD-10-CM | POA: Insufficient documentation

## 2015-08-08 DIAGNOSIS — I10 Essential (primary) hypertension: Secondary | ICD-10-CM | POA: Insufficient documentation

## 2016-03-19 DIAGNOSIS — N529 Male erectile dysfunction, unspecified: Secondary | ICD-10-CM | POA: Insufficient documentation

## 2016-06-12 DIAGNOSIS — R351 Nocturia: Secondary | ICD-10-CM | POA: Diagnosis not present

## 2016-06-12 DIAGNOSIS — E1165 Type 2 diabetes mellitus with hyperglycemia: Secondary | ICD-10-CM | POA: Diagnosis not present

## 2016-06-12 DIAGNOSIS — R81 Glycosuria: Secondary | ICD-10-CM | POA: Diagnosis not present

## 2016-06-12 DIAGNOSIS — J0100 Acute maxillary sinusitis, unspecified: Secondary | ICD-10-CM | POA: Diagnosis not present

## 2016-06-12 DIAGNOSIS — N183 Chronic kidney disease, stage 3 (moderate): Secondary | ICD-10-CM | POA: Diagnosis not present

## 2016-06-12 DIAGNOSIS — Z6829 Body mass index (BMI) 29.0-29.9, adult: Secondary | ICD-10-CM | POA: Diagnosis not present

## 2016-06-12 DIAGNOSIS — Z794 Long term (current) use of insulin: Secondary | ICD-10-CM | POA: Diagnosis not present

## 2016-06-12 DIAGNOSIS — R350 Frequency of micturition: Secondary | ICD-10-CM | POA: Diagnosis not present

## 2016-06-12 DIAGNOSIS — E118 Type 2 diabetes mellitus with unspecified complications: Secondary | ICD-10-CM | POA: Diagnosis not present

## 2016-08-24 DIAGNOSIS — E119 Type 2 diabetes mellitus without complications: Secondary | ICD-10-CM | POA: Diagnosis not present

## 2016-08-24 DIAGNOSIS — Z885 Allergy status to narcotic agent status: Secondary | ICD-10-CM | POA: Diagnosis not present

## 2016-08-24 DIAGNOSIS — E039 Hypothyroidism, unspecified: Secondary | ICD-10-CM | POA: Diagnosis not present

## 2016-08-24 DIAGNOSIS — H2513 Age-related nuclear cataract, bilateral: Secondary | ICD-10-CM | POA: Diagnosis not present

## 2016-08-24 DIAGNOSIS — Z794 Long term (current) use of insulin: Secondary | ICD-10-CM | POA: Diagnosis not present

## 2016-08-24 DIAGNOSIS — Z79899 Other long term (current) drug therapy: Secondary | ICD-10-CM | POA: Diagnosis not present

## 2016-08-24 DIAGNOSIS — E1136 Type 2 diabetes mellitus with diabetic cataract: Secondary | ICD-10-CM | POA: Diagnosis not present

## 2016-08-24 DIAGNOSIS — H02835 Dermatochalasis of left lower eyelid: Secondary | ICD-10-CM | POA: Diagnosis not present

## 2016-08-24 DIAGNOSIS — H43813 Vitreous degeneration, bilateral: Secondary | ICD-10-CM | POA: Diagnosis not present

## 2016-08-24 DIAGNOSIS — H35372 Puckering of macula, left eye: Secondary | ICD-10-CM | POA: Diagnosis not present

## 2016-08-24 DIAGNOSIS — H02403 Unspecified ptosis of bilateral eyelids: Secondary | ICD-10-CM | POA: Diagnosis not present

## 2016-08-24 DIAGNOSIS — I10 Essential (primary) hypertension: Secondary | ICD-10-CM | POA: Diagnosis not present

## 2016-08-24 DIAGNOSIS — H02832 Dermatochalasis of right lower eyelid: Secondary | ICD-10-CM | POA: Diagnosis not present

## 2016-08-24 DIAGNOSIS — Z7982 Long term (current) use of aspirin: Secondary | ICD-10-CM | POA: Diagnosis not present

## 2016-08-24 DIAGNOSIS — H34832 Tributary (branch) retinal vein occlusion, left eye, with macular edema: Secondary | ICD-10-CM | POA: Diagnosis not present

## 2016-08-24 DIAGNOSIS — Z886 Allergy status to analgesic agent status: Secondary | ICD-10-CM | POA: Diagnosis not present

## 2016-09-06 DIAGNOSIS — E785 Hyperlipidemia, unspecified: Secondary | ICD-10-CM | POA: Diagnosis not present

## 2016-09-06 DIAGNOSIS — I10 Essential (primary) hypertension: Secondary | ICD-10-CM | POA: Diagnosis not present

## 2016-09-06 DIAGNOSIS — E118 Type 2 diabetes mellitus with unspecified complications: Secondary | ICD-10-CM | POA: Diagnosis not present

## 2016-09-06 DIAGNOSIS — Z794 Long term (current) use of insulin: Secondary | ICD-10-CM | POA: Diagnosis not present

## 2016-09-06 DIAGNOSIS — E669 Obesity, unspecified: Secondary | ICD-10-CM | POA: Diagnosis not present

## 2016-09-06 LAB — HEMOGLOBIN A1C: Hemoglobin A1C: 10.2

## 2016-12-09 ENCOUNTER — Encounter (HOSPITAL_BASED_OUTPATIENT_CLINIC_OR_DEPARTMENT_OTHER): Payer: Self-pay | Admitting: Emergency Medicine

## 2016-12-09 ENCOUNTER — Emergency Department (HOSPITAL_BASED_OUTPATIENT_CLINIC_OR_DEPARTMENT_OTHER)
Admission: EM | Admit: 2016-12-09 | Discharge: 2016-12-09 | Disposition: A | Discharge status: Home / Self Care | Payer: Medicare Other | Attending: Emergency Medicine | Admitting: Emergency Medicine

## 2016-12-09 DIAGNOSIS — G8929 Other chronic pain: Secondary | ICD-10-CM | POA: Diagnosis not present

## 2016-12-09 DIAGNOSIS — E119 Type 2 diabetes mellitus without complications: Secondary | ICD-10-CM | POA: Insufficient documentation

## 2016-12-09 DIAGNOSIS — Z7984 Long term (current) use of oral hypoglycemic drugs: Secondary | ICD-10-CM | POA: Insufficient documentation

## 2016-12-09 DIAGNOSIS — I10 Essential (primary) hypertension: Secondary | ICD-10-CM | POA: Insufficient documentation

## 2016-12-09 DIAGNOSIS — L299 Pruritus, unspecified: Secondary | ICD-10-CM | POA: Diagnosis present

## 2016-12-09 DIAGNOSIS — R21 Rash and other nonspecific skin eruption: Secondary | ICD-10-CM | POA: Diagnosis not present

## 2016-12-09 DIAGNOSIS — M545 Low back pain: Secondary | ICD-10-CM | POA: Diagnosis not present

## 2016-12-09 DIAGNOSIS — Z79899 Other long term (current) drug therapy: Secondary | ICD-10-CM | POA: Insufficient documentation

## 2016-12-09 MED ORDER — NYSTATIN 100000 UNIT/GM EX CREA: TOPICAL_CREAM | CUTANEOUS | 1 refills | Status: DC

## 2016-12-09 MED ORDER — HYDROXYZINE HCL 25 MG PO TABS: 50.0000 mg | ORAL_TABLET | ORAL | 0 refills | Status: DC | PRN

## 2016-12-09 MED ORDER — LIDOCAINE 5 % EX PTCH: 1.0000 | MEDICATED_PATCH | CUTANEOUS | 0 refills | Status: DC

## 2016-12-09 NOTE — ED Triage Notes (Signed)
Patient reports that he has had a rash to his under stomach area and has chronic back pain

## 2016-12-09 NOTE — ED Provider Notes (Signed)
[facility] DEPT [facility] Provider Note   CSN: [id number] Arrival date & time: 12/09/16  1114     History   Chief Complaint Chief Complaint  Patient presents with  . Back Pain  . Rash    HPI [name] is a 66 y.o. male.  HPI Patient has itching around his waist that has been pretty intense for several days. He reports last night he started itching so much that he causes of the bleed. He stopped wearing belts that he thought might be contributing to the problem. He tried putting Neosporin cream all over himself. He has discontinued using the type of soap that he been previously using. None of these things have seemed to alleviate the itching and rash. He also tried Benadryl last night. As secondary complaint, patient reports he has chronic low back pain. He states he used to get injections but then he started to get worried because he knew some people who had adverse outcomes from back injections. He reports he is continuing to be active but he is trying to do more walking for his physical conditioning and now his lower back is bothering him more. He reports is worse if he tries to bend over to pick something up. No weakness numbness or tingling into the legs. He reports he's tried some of the over-the-counter Lidoderm patches but he thinks that the prescription once probably work better and he would like to try those Past Medical History:  Diagnosis Date  . Autoimmune disease ([place])   . Diabetes mellitus without complication ([place])   . GERD (gastroesophageal reflux disease)   . Joint pain   . PTSD (post-traumatic stress disorder)     Patient Active Problem List   Diagnosis Date Noted  . Fever 09/28/2014  . SIRS (systemic inflammatory response syndrome) ([place]) 09/28/2014  . Influenza-like illness 09/28/2014  . UTI (urinary tract infection) 09/28/2014  . Myositis 09/28/2014  . DM2 (diabetes mellitus, type 2) ([place]) 09/28/2014  . HTN (hypertension) 09/28/2014    Past Surgical  History:  Procedure Laterality Date  . BIOPSY SHOULDER     Left       Home Medications    Prior to Admission medications   Medication Sig Start Date End Date Taking? Authorizing Provider  Dulaglutide (TRULICITY) 1.5 XN/2.3FT SOPN Inject into the skin.   Yes [provider]  testosterone cypionate (DEPOTESTOSTERONE CYPIONATE) 200 MG/ML injection Inject into the muscle.   Yes [provider]  acarbose (PRECOSE) 100 MG tablet Take 100 mg by mouth 3 (three) times daily with meals.    [provider]  ACETAMINOPHEN-CODEINE #3 PO Take by mouth as needed.    [provider]  aspirin 81 MG tablet Take 81 mg by mouth daily.    [provider]  calcium carbonate (OS-CAL) 600 MG TABS tablet Take 600 mg by mouth 2 (two) times daily with a meal.    [provider]  dicyclomine (BENTYL) 10 MG capsule Take 10 mg by mouth 4 (four) times daily -  before meals and at bedtime.    [provider]  folic acid (FOLVITE) 1 MG tablet Take 1 mg by mouth daily.    [provider]  furosemide (LASIX) 40 MG tablet Take 40 mg by mouth.    [provider]  glipiZIDE (GLUCOTROL XL) 10 MG 24 hr tablet Take 10 mg by mouth daily with breakfast.    [provider]  hydrOXYzine (ATARAX/VISTARIL) 25 MG tablet Take 25 mg by mouth  3 (three) times daily as needed.    [provider]  hydrOXYzine (ATARAX/VISTARIL) 25 MG tablet Take 2 tablets (50 mg total) by mouth every 4 (four) hours as needed. 12/09/16   Charlesetta Shanks, MD  insulin glargine (LANTUS) 100 UNIT/ML injection Inject 0.46-0.56 mLs (46-56 Units total) into the skin at bedtime. For now take 30 units twice a day and then increase to your usual dose as your oral intake improves. Increase both doses by 2units every day. Check your blood sugars at home three times daily. Call your doctor if sugar level is more than 300 or less than 80. 10/01/14   Bonnielee Haff, MD    Levothyroxine Sodium 88 MCG CAPS Take by mouth.    [provider]  lidocaine (LIDODERM) 5 % Place 1 patch onto the skin daily. Remove & Discard patch within 12 hours or as directed by MD 12/09/16   Charlesetta Shanks, MD  loperamide (IMODIUM) 2 MG capsule Take by mouth as needed for diarrhea or loose stools.    [provider]  Loratadine 10 MG CAPS Take by mouth.    [provider]  losartan (COZAAR) 50 MG tablet Take 100 mg by mouth daily.     [provider]  methotrexate (RHEUMATREX) 5 MG tablet Take 2 tablets (10 mg total) by mouth once a week. Caution: Chemotherapy. Protect from light. Take on fridays per patient.  OKAY TO RESUME FROM 10/08/14. Patient taking differently: Take 2.5 mg by mouth once a week. Caution: Chemotherapy. Protect from light. Take on fridays per patient.  OKAY TO RESUME FROM 10/08/14. 10/01/14   Bonnielee Haff, MD  niacin 500 MG tablet Take 500 mg by mouth at bedtime.    [provider]  nystatin cream (MYCOSTATIN) Apply to affected area 2 times daily 12/09/16   Charlesetta Shanks, MD  omeprazole (PRILOSEC) 20 MG capsule Take 20 mg by mouth daily.    [provider]  polyvinyl alcohol (ARTIFICIAL TEARS) 1.4 % ophthalmic solution 1 drop as needed for dry eyes.    [provider]  Potassium Bicarb-Citric Acid 20 MEQ TBEF Take by mouth daily.    [provider]  predniSONE (DELTASONE) 5 MG tablet Take 5 mg by mouth daily with breakfast.    [provider]  QUEtiapine (SEROQUEL) 100 MG tablet Take 100 mg by mouth. One-half tab BID and one tab at QHS for anxiety/mood.    [provider]  sertraline (ZOLOFT) 100 MG tablet Take 100 mg by mouth daily.    [provider]  sildenafil (VIAGRA) 100 MG tablet Take 100 mg by mouth daily as needed for erectile dysfunction.    [provider]  tamsulosin (FLOMAX) 0.4 MG CAPS capsule Take 0.4 mg by mouth as needed.    [provider]    Family History Family History  Problem Relation Age of Onset  . Hypertension Mother   . Diabetes Mother   . Heart attack Father     Social History Social History  Substance Use Topics  . Smoking status: Never Smoker  . Smokeless tobacco: Never Used  . Alcohol use No     Allergies   Hydrocodone; Shellfish allergy; and Ultram [tramadol]   Review of Systems Review of Systems 10 Systems reviewed and are negative for acute change except as noted in the HPI.  Physical Exam Updated Vital Signs BP 131/69 (BP Location: Right Arm)   Pulse 72   Temp 98.5 F (36.9 C) (Oral)  Resp 16   Ht 5\' 8"  (1.727 m)   Wt 95.3 kg (210 lb)   SpO2 98%   BMI 31.93 kg/m   Physical Exam  Constitutional: He is oriented to person, place, and time.  Patient is alert and nontoxic. Clinically well and appearance. No respiratory distress.  HENT:  Head: Normocephalic and atraumatic.  Eyes: EOM are normal.  Neck: Neck supple.  Cardiovascular: Normal rate, regular rhythm, normal heart sounds and intact distal pulses.   Pulmonary/Chest: Effort normal and breath sounds normal.  Abdominal:  Patient has protuberant, obese abdomen. It is soft and nontender.  Musculoskeletal: Normal range of motion. He exhibits no edema or tenderness.  Visualization of the back is normal. No localizing bony point tenderness. Patient has normal range of motion with forward flexion and lying supine in the stretcher without difficulty. He can also transition to sitting without difficulty.  Neurological: He is alert and oriented to person, place, and time. No cranial nerve deficit. He exhibits normal muscle tone. Coordination normal.  Skin: Skin is warm and dry. Rash noted.  Patient's rash is predominantly in the pannus fold of the lower abdomen. Patient has protuberant abdomen which causes some skin overlap over his groin. Skin is somewhat hyperpigmented and thickened in this band around his waist. No open  lesions, no pustules or vesicles. No significant pattern of notable papules over the remainder of the skin (although patient notes small lesions).  Psychiatric: He has a normal mood and affect.     ED Treatments / Results  Labs (all labs ordered are listed, but only abnormal results are displayed) Labs Reviewed - No data to display  EKG  EKG Interpretation None       Radiology No results found.  Procedures Procedures (including critical care time)  Medications Ordered in ED Medications - No data to display   Initial Impression / Assessment and Plan / ED Course  I have reviewed the triage vital signs and the nursing notes.  Pertinent labs & imaging results that were available during my care of the patient were reviewed by me and considered in my medical decision making (see chart for details).     Final Clinical Impressions(s) / ED Diagnoses   Final diagnoses:  Rash  Chronic midline low back pain without sciatica  At this time, patient's rash is most suggestive of chronic, subacute candidal rash in the skin folds of the lower abdomen and groin. This is hyperpigmented. There is however no wet or erythematous lesions. May also be due to chronic allergic reaction from a belt. Patient however reports he stopped using all of his belts. I really do not note other significant rash on his skin although he reported itching everywhere last night. Time we'll opt to treat the candidal appearing rash with nystatin cream. Patient is given hydroxyzine to use for itching. Also as a secondary request Lidoderm patch for chronic low back pain.  New Prescriptions New Prescriptions   HYDROXYZINE (ATARAX/VISTARIL) 25 MG TABLET    Take 2 tablets (50 mg total) by mouth every 4 (four) hours as needed.   LIDOCAINE (LIDODERM) 5 %    Place 1 patch onto the skin daily. Remove & Discard patch within 12 hours or as directed by MD   NYSTATIN CREAM (MYCOSTATIN)    Apply to affected area 2 times daily      Charlesetta Shanks, MD 12/09/16 1345

## 2016-12-09 NOTE — ED Notes (Signed)
ED Provider at bedside. 

## 2017-01-12 LAB — HEMOGLOBIN A1C: Hemoglobin A1C: 8.4

## 2017-01-22 DIAGNOSIS — E669 Obesity, unspecified: Secondary | ICD-10-CM | POA: Diagnosis not present

## 2017-01-22 DIAGNOSIS — E785 Hyperlipidemia, unspecified: Secondary | ICD-10-CM | POA: Diagnosis not present

## 2017-01-22 DIAGNOSIS — I10 Essential (primary) hypertension: Secondary | ICD-10-CM | POA: Diagnosis not present

## 2017-01-22 DIAGNOSIS — E118 Type 2 diabetes mellitus with unspecified complications: Secondary | ICD-10-CM | POA: Diagnosis not present

## 2017-01-22 DIAGNOSIS — Z794 Long term (current) use of insulin: Secondary | ICD-10-CM | POA: Diagnosis not present

## 2017-01-24 DIAGNOSIS — H524 Presbyopia: Secondary | ICD-10-CM | POA: Diagnosis not present

## 2017-01-24 DIAGNOSIS — H5202 Hypermetropia, left eye: Secondary | ICD-10-CM | POA: Diagnosis not present

## 2017-01-24 DIAGNOSIS — H52223 Regular astigmatism, bilateral: Secondary | ICD-10-CM | POA: Diagnosis not present

## 2017-01-24 DIAGNOSIS — H25813 Combined forms of age-related cataract, bilateral: Secondary | ICD-10-CM | POA: Diagnosis not present

## 2017-01-24 DIAGNOSIS — E119 Type 2 diabetes mellitus without complications: Secondary | ICD-10-CM | POA: Diagnosis not present

## 2017-01-24 DIAGNOSIS — E113291 Type 2 diabetes mellitus with mild nonproliferative diabetic retinopathy without macular edema, right eye: Secondary | ICD-10-CM | POA: Diagnosis not present

## 2017-05-29 DIAGNOSIS — E1151 Type 2 diabetes mellitus with diabetic peripheral angiopathy without gangrene: Secondary | ICD-10-CM | POA: Diagnosis not present

## 2017-05-29 DIAGNOSIS — L603 Nail dystrophy: Secondary | ICD-10-CM | POA: Diagnosis not present

## 2017-05-29 DIAGNOSIS — L84 Corns and callosities: Secondary | ICD-10-CM | POA: Diagnosis not present

## 2017-05-29 DIAGNOSIS — I739 Peripheral vascular disease, unspecified: Secondary | ICD-10-CM | POA: Diagnosis not present

## 2017-06-20 DIAGNOSIS — E785 Hyperlipidemia, unspecified: Secondary | ICD-10-CM | POA: Diagnosis not present

## 2017-06-20 DIAGNOSIS — Z794 Long term (current) use of insulin: Secondary | ICD-10-CM | POA: Diagnosis not present

## 2017-06-20 DIAGNOSIS — Z6831 Body mass index (BMI) 31.0-31.9, adult: Secondary | ICD-10-CM | POA: Diagnosis not present

## 2017-06-20 DIAGNOSIS — I10 Essential (primary) hypertension: Secondary | ICD-10-CM | POA: Diagnosis not present

## 2017-06-20 DIAGNOSIS — E1165 Type 2 diabetes mellitus with hyperglycemia: Secondary | ICD-10-CM | POA: Diagnosis not present

## 2017-06-20 DIAGNOSIS — E669 Obesity, unspecified: Secondary | ICD-10-CM | POA: Diagnosis not present

## 2017-06-26 DIAGNOSIS — D485 Neoplasm of uncertain behavior of skin: Secondary | ICD-10-CM | POA: Diagnosis not present

## 2017-06-26 DIAGNOSIS — Z419 Encounter for procedure for purposes other than remedying health state, unspecified: Secondary | ICD-10-CM | POA: Diagnosis not present

## 2017-06-26 DIAGNOSIS — L853 Xerosis cutis: Secondary | ICD-10-CM | POA: Diagnosis not present

## 2017-06-26 DIAGNOSIS — D2371 Other benign neoplasm of skin of right lower limb, including hip: Secondary | ICD-10-CM | POA: Diagnosis not present

## 2017-06-26 DIAGNOSIS — D2372 Other benign neoplasm of skin of left lower limb, including hip: Secondary | ICD-10-CM | POA: Diagnosis not present

## 2017-08-20 DIAGNOSIS — K529 Noninfective gastroenteritis and colitis, unspecified: Secondary | ICD-10-CM | POA: Diagnosis not present

## 2017-09-13 DIAGNOSIS — R21 Rash and other nonspecific skin eruption: Secondary | ICD-10-CM | POA: Diagnosis not present

## 2017-09-19 DIAGNOSIS — I739 Peripheral vascular disease, unspecified: Secondary | ICD-10-CM | POA: Diagnosis not present

## 2017-09-19 DIAGNOSIS — L603 Nail dystrophy: Secondary | ICD-10-CM | POA: Diagnosis not present

## 2017-09-19 DIAGNOSIS — L84 Corns and callosities: Secondary | ICD-10-CM | POA: Diagnosis not present

## 2017-09-19 DIAGNOSIS — E1151 Type 2 diabetes mellitus with diabetic peripheral angiopathy without gangrene: Secondary | ICD-10-CM | POA: Diagnosis not present

## 2017-09-25 ENCOUNTER — Ambulatory Visit
Admission: RE | Admit: 2017-09-25 | Discharge: 2017-09-25 | Disposition: A | Discharge status: Home / Self Care | Payer: Medicare Other | Source: Ambulatory Visit | Attending: Dermatology | Admitting: Dermatology

## 2017-09-25 ENCOUNTER — Other Ambulatory Visit: Payer: Self-pay | Admitting: Dermatology

## 2017-09-25 DIAGNOSIS — L299 Pruritus, unspecified: Secondary | ICD-10-CM

## 2017-09-25 DIAGNOSIS — Z79899 Other long term (current) drug therapy: Secondary | ICD-10-CM | POA: Diagnosis not present

## 2017-09-25 DIAGNOSIS — L298 Other pruritus: Secondary | ICD-10-CM | POA: Diagnosis not present

## 2017-09-26 LAB — HEPATIC FUNCTION PANEL
ALK PHOS: 76 (ref 25–125)
ALT: 25 (ref 10–40)
AST: 21 (ref 14–40)
Bilirubin, Total: 0.6

## 2017-09-26 LAB — BASIC METABOLIC PANEL
BUN: 26 — AB (ref 4–21)
Creatinine: 1.7 — AB (ref <=1.3)
Glucose: 373
Potassium: 4.8 (ref 3.4–5.3)
SODIUM: 138 (ref 137–147)

## 2017-09-30 ENCOUNTER — Ambulatory Visit (INDEPENDENT_AMBULATORY_CARE_PROVIDER_SITE_OTHER): Payer: Medicare Other | Admitting: Family Medicine

## 2017-09-30 ENCOUNTER — Encounter: Payer: Self-pay | Admitting: Family Medicine

## 2017-09-30 VITALS — BP 132/70 | HR 96 | Ht 69.5 in | Wt 210.2 lb

## 2017-09-30 DIAGNOSIS — L299 Pruritus, unspecified: Secondary | ICD-10-CM

## 2017-09-30 DIAGNOSIS — R7989 Other specified abnormal findings of blood chemistry: Secondary | ICD-10-CM

## 2017-09-30 DIAGNOSIS — N400 Enlarged prostate without lower urinary tract symptoms: Secondary | ICD-10-CM

## 2017-09-30 DIAGNOSIS — M609 Myositis, unspecified: Secondary | ICD-10-CM | POA: Diagnosis not present

## 2017-09-30 DIAGNOSIS — Z794 Long term (current) use of insulin: Secondary | ICD-10-CM | POA: Diagnosis not present

## 2017-09-30 DIAGNOSIS — E119 Type 2 diabetes mellitus without complications: Secondary | ICD-10-CM | POA: Diagnosis not present

## 2017-09-30 DIAGNOSIS — I10 Essential (primary) hypertension: Secondary | ICD-10-CM

## 2017-09-30 DIAGNOSIS — Z79899 Other long term (current) drug therapy: Secondary | ICD-10-CM

## 2017-09-30 DIAGNOSIS — D649 Anemia, unspecified: Secondary | ICD-10-CM

## 2017-09-30 DIAGNOSIS — E039 Hypothyroidism, unspecified: Secondary | ICD-10-CM

## 2017-09-30 DIAGNOSIS — Z1159 Encounter for screening for other viral diseases: Secondary | ICD-10-CM

## 2017-09-30 DIAGNOSIS — D539 Nutritional anemia, unspecified: Secondary | ICD-10-CM | POA: Insufficient documentation

## 2017-09-30 LAB — LIPID PANEL
CHOLESTEROL: 197 mg/dL (ref 0–200)
HDL: 33.7 mg/dL — ABNORMAL LOW (ref >39.00)
LDL CALC: 127 mg/dL — AB (ref 0–99)
NonHDL: 163.5
TRIGLYCERIDES: 183 mg/dL — AB (ref 0.0–149.0)
Total CHOL/HDL Ratio: 6
VLDL: 36.6 mg/dL (ref 0.0–40.0)

## 2017-09-30 LAB — CBC
HCT: 37.7 % — ABNORMAL LOW (ref 39.0–52.0)
Hemoglobin: 12.4 g/dL — ABNORMAL LOW (ref 13.0–17.0)
MCHC: 32.8 g/dL (ref 30.0–36.0)
MCV: 92.3 fl (ref 78.0–100.0)
Platelets: 140 10*3/uL — ABNORMAL LOW (ref 150.0–400.0)
RBC: 4.09 Mil/uL — AB (ref 4.22–5.81)
RDW: 14.6 % (ref 11.5–15.5)
WBC: 5.5 10*3/uL (ref 4.0–10.5)

## 2017-09-30 LAB — COMPREHENSIVE METABOLIC PANEL
ALBUMIN: 4.3 g/dL (ref 3.5–5.2)
ALT: 32 U/L (ref 0–53)
AST: 26 U/L (ref 0–37)
Alkaline Phosphatase: 75 U/L (ref 39–117)
BILIRUBIN TOTAL: 0.6 mg/dL (ref 0.2–1.2)
BUN: 24 mg/dL — ABNORMAL HIGH (ref 6–23)
CO2: 24 meq/L (ref 19–32)
CREATININE: 1.27 mg/dL (ref 0.40–1.50)
Calcium: 9.7 mg/dL (ref 8.4–10.5)
Chloride: 107 mEq/L (ref 96–112)
GFR: 72.33 mL/min (ref >60.00)
Glucose, Bld: 271 mg/dL — ABNORMAL HIGH (ref 70–99)
Potassium: 4 mEq/L (ref 3.5–5.1)
Sodium: 139 mEq/L (ref 135–145)
Total Protein: 6.4 g/dL (ref 6.0–8.3)

## 2017-09-30 LAB — FOLATE

## 2017-09-30 LAB — TESTOSTERONE: Testosterone: 57.67 ng/dL — ABNORMAL LOW (ref 300.00–890.00)

## 2017-09-30 LAB — FERRITIN: Ferritin: 430.8 ng/mL — ABNORMAL HIGH (ref 22.0–322.0)

## 2017-09-30 LAB — VITAMIN B12: Vitamin B-12: 422 pg/mL (ref 211–911)

## 2017-09-30 LAB — TSH: TSH: 1.48 u[IU]/mL (ref 0.35–4.50)

## 2017-09-30 NOTE — Assessment & Plan Note (Signed)
Update PSA He'll continue tamsulosin for LUT's

## 2017-09-30 NOTE — Assessment & Plan Note (Signed)
Follows with rheumatology, stable at this time.

## 2017-09-30 NOTE — Assessment & Plan Note (Signed)
Recent labs show elevated creatinine at 1.6 with mild anemia which may be related to CKD. Will check anemia panel today.

## 2017-09-30 NOTE — Patient Instructions (Signed)
It was very nice to meet you today. Continue current medications. We will call you once your lab results return.

## 2017-09-30 NOTE — Assessment & Plan Note (Signed)
Chronic low testosterone.  Previously on injection but didn't tolerate well Has some fatigue, will recheck today.

## 2017-09-30 NOTE — Assessment & Plan Note (Signed)
Itching with unclear etiology, denies new medication or changes to cleaning agents. Continue sarna and hydroxyzine prn.  Check Hep C antibody.

## 2017-09-30 NOTE — Assessment & Plan Note (Signed)
BP is well controlled, he'll continue losartan at current dose Follow low salt diet with regular exercise.

## 2017-09-30 NOTE — Assessment & Plan Note (Signed)
Update TSH today. 

## 2017-09-30 NOTE — Progress Notes (Signed)
[name] - 67 y.o. male MRN [id number]  Date of birth: 06-13-1950  Subjective Chief Complaint  Patient presents with  . Rash    HPI [name] is a 67 y.o. male with a history of HTN, T2DM, myositis, GERD, PTSD, BPH, hypothyroidism, and IBS here today to establish care with new PCP.  He does participate in dual care with the [place] system as well however he has found it very difficult to see his PCP through the [place] and estimates it has been over 2 years since he was last seen there. He is also followed by a rheumatologist through the [facility] for myositis and a civilian endocrinologist through [facility] for management of his diabetes.    -Rash:  Has had "bumps" appear on arms and trunk that are very itchy.  Initially started a few weeks ago.  Has tried antihistamines including cetirizine and hydroxyzine which helped some.  He also completed a course of prednisone, which did not improve symptoms.  He has also been seen by a dermatologist and checked for scabies and had lab work  And CXR completed to r/o lymphoma.  Scabies testing was negative and he was instructed to follow up with PCP regarding lab work.  He did have some diarrhea  Couple of weeks ago but otherwise denies other symptoms. He denies any recent medication changes, new soaps/lotions/detergents.    -T2DM:  History of T2DM, service connected through [place] from agent orange exposure.  Diabetes has not been well controlled with A1c of 8.8% in 05/2017.  He continues to follow with endocrinology for management of this.  He denies complications including neuropathy or vascular disease, although his recent creatinine was abnormal.  -HTN:  Current treatment with losartan, doing well with this and BP has been well controlled.  He denies side effects from medication and has not had any anginal symptoms, shortness of breath, palpitations, headache or vision changes.   -Hypothyroidism:   Managed with levothyroxine 45mcg.  Endocrinology is not following him  for hypothyroidism and he states that thyroid function has not been checked in over two years.  He reports that his [place] doctor is just writing the prescription for him.  He denies any specific symptoms of hypo/hyperthyroidism.   -BPH:  LUT's well controlled with tamsulosin however does not think he has had PSA checked in several years.  Reports history of agent orange exposure.    ROS: ROS completed and negative except as noted per HPI  Allergies  Allergen Reactions  . Hydrocodone   . Shellfish Allergy   . Ultram [Tramadol]     "shakes"    Past Medical History:  Diagnosis Date  . Autoimmune disease ([place])   . Diabetes mellitus without complication ([place])   . GERD (gastroesophageal reflux disease)   . Joint pain   . PTSD (post-traumatic stress disorder)     Past Surgical History:  Procedure Laterality Date  . BIOPSY SHOULDER     Left    Social History   Socioeconomic History  . Marital status: Married    Spouse name: Not on file  . Number of children: 4  . Years of education: 49  . Highest education level: Not on file  Occupational History  . Occupation: Retired  Scientific laboratory technician  . Financial resource strain: Not on file  . Food insecurity:    Worry: Not on file    Inability: Not on file  . Transportation needs:    Medical: Not on file    Non-medical:  Not on file  Tobacco Use  . Smoking status: Never Smoker  . Smokeless tobacco: Never Used  Substance and Sexual Activity  . Alcohol use: No  . Drug use: No  . Sexual activity: Not on file  Lifestyle  . Physical activity:    Days per week: Not on file    Minutes per session: Not on file  . Stress: Not on file  Relationships  . Social connections:    Talks on phone: Not on file    Gets together: Not on file    Attends religious service: Not on file    Active member of club or organization: Not on file    Attends meetings of clubs or organizations: Not on file    Relationship status: Not on file  Other Topics  Concern  . Not on file  Social History Narrative   Lives at home with his wife and son.   Right-handed.   2 cups caffeine per day.    Family History  Problem Relation Age of Onset  . Hypertension Mother   . Diabetes Mother   . Heart attack Father     Health Maintenance  Topic Date Due  . HEMOGLOBIN A1C  1950-12-25  . Hepatitis C Screening  08-Apr-1951  . FOOT EXAM  10/29/1958  . OPHTHALMOLOGY EXAM  10/29/1958  . TETANUS/TDAP  10/29/1967  . COLONOSCOPY  10/29/1998  . PNA vac Low Risk Adult (1 of 2 - PCV13) 10/28/2013  . INFLUENZA VACCINE  11/28/2017    ----------------------------------------------------------------------------------------------------------------------------------------------------------------------------------------------------------------- Physical Exam BP 132/70   Pulse 96   Ht 5' 9.5" (1.765 m)   Wt 210 lb 3.2 oz (95.3 kg)   BMI 30.60 kg/m   Physical Exam  Constitutional: He is oriented to person, place, and time. He appears well-nourished. No distress.  HENT:  Head: Normocephalic and atraumatic.  Mouth/Throat: Oropharynx is clear and moist.  Eyes: Conjunctivae are normal. No scleral icterus.  Neck: Neck supple. No thyromegaly present.  Cardiovascular: Normal rate, regular rhythm, normal heart sounds and intact distal pulses.  Pulmonary/Chest: Effort normal and breath sounds normal.  Musculoskeletal: He exhibits no edema or tenderness.  Lymphadenopathy:    He has no cervical adenopathy.  Neurological: He is alert and oriented to person, place, and time.  Skin: Rash (scattered slightly raised papules on the arms bilaterally.  ) noted.  Psychiatric: He has a normal mood and affect. His behavior is normal.    ------------------------------------------------------------------------------------------------------------------------------------------------------------------------------------------------------------------- Assessment and  Plan  Itching Itching with unclear etiology, denies new medication or changes to cleaning agents. Continue sarna and hydroxyzine prn.  Check Hep C antibody.   Myositis Follows with rheumatology, stable at this time.   HTN (hypertension) BP is well controlled, he'll continue losartan at current dose Follow low salt diet with regular exercise.   DM2 (diabetes mellitus, type 2) Poorly controlled, managed by endocrinology currently. He has upcoming follow up in a few weeks.   Low testosterone Chronic low testosterone.  Previously on injection but didn't tolerate well Has some fatigue, will recheck today.    Benign prostatic hyperplasia without lower urinary tract symptoms Update PSA He'll continue tamsulosin for LUT's  Acquired hypothyroidism Update TSH today.   Anemia Recent labs show elevated creatinine at 1.6 with mild anemia which may be related to CKD. Will check anemia panel today.

## 2017-09-30 NOTE — Assessment & Plan Note (Signed)
Poorly controlled, managed by endocrinology currently. He has upcoming follow up in a few weeks.

## 2017-10-01 DIAGNOSIS — E291 Testicular hypofunction: Secondary | ICD-10-CM | POA: Diagnosis not present

## 2017-10-01 DIAGNOSIS — Z125 Encounter for screening for malignant neoplasm of prostate: Secondary | ICD-10-CM | POA: Diagnosis not present

## 2017-10-01 DIAGNOSIS — R972 Elevated prostate specific antigen [PSA]: Secondary | ICD-10-CM | POA: Diagnosis not present

## 2017-10-01 DIAGNOSIS — N5201 Erectile dysfunction due to arterial insufficiency: Secondary | ICD-10-CM | POA: Diagnosis not present

## 2017-10-01 LAB — HEPATITIS C ANTIBODY
HEP C AB: NONREACTIVE
SIGNAL TO CUT-OFF: 0.04 (ref <1.00)

## 2017-10-03 ENCOUNTER — Other Ambulatory Visit: Payer: Self-pay | Admitting: Emergency Medicine

## 2017-10-03 MED ORDER — ATORVASTATIN CALCIUM 40 MG PO TABS: 40.0000 mg | ORAL_TABLET | Freq: Every day | ORAL | 3 refills | Status: DC

## 2017-10-03 NOTE — Progress Notes (Signed)
I would strongly recommend atorvastatin as his calculated risk of heart disease with diabetes and high cholesterol is 36% over the next 10 years which is quite high.  Niacin would not be sufficient to achieve control of his cholesterol.

## 2017-10-09 ENCOUNTER — Telehealth: Payer: Self-pay | Admitting: Family Medicine

## 2017-10-09 MED ORDER — SIMVASTATIN 40 MG PO TABS: 40.0000 mg | ORAL_TABLET | Freq: Every day | ORAL | 3 refills | Status: DC

## 2017-10-09 NOTE — Telephone Encounter (Signed)
Left detailed VM for pt informing him of the Rx change that had been called into his pharmacy. TLG

## 2017-10-09 NOTE — Telephone Encounter (Signed)
Rx changed to simvastatin.

## 2017-10-09 NOTE — Telephone Encounter (Signed)
Copied from [place] [id number]. Topic: Quick Communication - Rx Refill/Question >> Oct 09, 2017  8:55 AM Margot Ables wrote: Medication: pt states that he had taken atorvastatin before and now he remembers that it didn't get along with him (cough and other side effects). He said that he had previously been changed to simvastatin and it worked well. He is asking for Dr. Zigmund Daniel to change prescription. Pt did not pick up the atorvastatin. Ok to leave detailed VM on cell or home phone. Please try pt on cell first. Preferred Pharmacy (with phone number or street name): [id number] [place], [place] [address] [phone] (Phone) [phone] (Fax)

## 2017-10-16 DIAGNOSIS — L910 Hypertrophic scar: Secondary | ICD-10-CM | POA: Diagnosis not present

## 2017-10-16 DIAGNOSIS — L298 Other pruritus: Secondary | ICD-10-CM | POA: Diagnosis not present

## 2017-10-16 DIAGNOSIS — Z419 Encounter for procedure for purposes other than remedying health state, unspecified: Secondary | ICD-10-CM | POA: Diagnosis not present

## 2017-11-12 DIAGNOSIS — E119 Type 2 diabetes mellitus without complications: Secondary | ICD-10-CM | POA: Diagnosis not present

## 2017-11-12 DIAGNOSIS — Z6833 Body mass index (BMI) 33.0-33.9, adult: Secondary | ICD-10-CM | POA: Diagnosis not present

## 2017-11-12 DIAGNOSIS — E785 Hyperlipidemia, unspecified: Secondary | ICD-10-CM | POA: Diagnosis not present

## 2017-11-12 DIAGNOSIS — I10 Essential (primary) hypertension: Secondary | ICD-10-CM | POA: Diagnosis not present

## 2017-11-12 DIAGNOSIS — Z794 Long term (current) use of insulin: Secondary | ICD-10-CM | POA: Diagnosis not present

## 2017-11-12 DIAGNOSIS — E669 Obesity, unspecified: Secondary | ICD-10-CM | POA: Diagnosis not present

## 2017-11-12 LAB — HEMOGLOBIN A1C: Hemoglobin A1C: 9.3

## 2017-12-25 DIAGNOSIS — L603 Nail dystrophy: Secondary | ICD-10-CM | POA: Diagnosis not present

## 2017-12-25 DIAGNOSIS — L84 Corns and callosities: Secondary | ICD-10-CM | POA: Diagnosis not present

## 2017-12-25 DIAGNOSIS — E1151 Type 2 diabetes mellitus with diabetic peripheral angiopathy without gangrene: Secondary | ICD-10-CM | POA: Diagnosis not present

## 2017-12-25 DIAGNOSIS — I739 Peripheral vascular disease, unspecified: Secondary | ICD-10-CM | POA: Diagnosis not present

## 2018-02-12 DIAGNOSIS — Z794 Long term (current) use of insulin: Secondary | ICD-10-CM | POA: Diagnosis not present

## 2018-02-12 DIAGNOSIS — R0602 Shortness of breath: Secondary | ICD-10-CM | POA: Diagnosis not present

## 2018-02-12 DIAGNOSIS — E119 Type 2 diabetes mellitus without complications: Secondary | ICD-10-CM | POA: Diagnosis not present

## 2018-02-12 DIAGNOSIS — Z79899 Other long term (current) drug therapy: Secondary | ICD-10-CM | POA: Diagnosis not present

## 2018-02-12 DIAGNOSIS — E291 Testicular hypofunction: Secondary | ICD-10-CM | POA: Diagnosis not present

## 2018-02-12 DIAGNOSIS — I10 Essential (primary) hypertension: Secondary | ICD-10-CM | POA: Diagnosis not present

## 2018-02-12 DIAGNOSIS — Z125 Encounter for screening for malignant neoplasm of prostate: Secondary | ICD-10-CM | POA: Diagnosis not present

## 2018-02-12 DIAGNOSIS — R5383 Other fatigue: Secondary | ICD-10-CM | POA: Diagnosis not present

## 2018-02-12 DIAGNOSIS — E7800 Pure hypercholesterolemia, unspecified: Secondary | ICD-10-CM | POA: Diagnosis not present

## 2018-02-12 DIAGNOSIS — R635 Abnormal weight gain: Secondary | ICD-10-CM | POA: Diagnosis not present

## 2018-02-12 DIAGNOSIS — E559 Vitamin D deficiency, unspecified: Secondary | ICD-10-CM | POA: Diagnosis not present

## 2018-02-13 DIAGNOSIS — E7800 Pure hypercholesterolemia, unspecified: Secondary | ICD-10-CM | POA: Diagnosis not present

## 2018-02-13 DIAGNOSIS — E291 Testicular hypofunction: Secondary | ICD-10-CM | POA: Diagnosis not present

## 2018-02-13 DIAGNOSIS — E8881 Metabolic syndrome: Secondary | ICD-10-CM | POA: Diagnosis not present

## 2018-02-13 DIAGNOSIS — R5383 Other fatigue: Secondary | ICD-10-CM | POA: Diagnosis not present

## 2018-02-13 DIAGNOSIS — Z79899 Other long term (current) drug therapy: Secondary | ICD-10-CM | POA: Diagnosis not present

## 2018-02-13 DIAGNOSIS — Z125 Encounter for screening for malignant neoplasm of prostate: Secondary | ICD-10-CM | POA: Diagnosis not present

## 2018-02-13 DIAGNOSIS — E559 Vitamin D deficiency, unspecified: Secondary | ICD-10-CM | POA: Diagnosis not present

## 2018-03-14 DIAGNOSIS — R635 Abnormal weight gain: Secondary | ICD-10-CM | POA: Diagnosis not present

## 2018-03-14 DIAGNOSIS — I10 Essential (primary) hypertension: Secondary | ICD-10-CM | POA: Diagnosis not present

## 2018-03-14 DIAGNOSIS — E1165 Type 2 diabetes mellitus with hyperglycemia: Secondary | ICD-10-CM | POA: Diagnosis not present

## 2018-03-14 DIAGNOSIS — Z79899 Other long term (current) drug therapy: Secondary | ICD-10-CM | POA: Diagnosis not present

## 2018-03-18 DIAGNOSIS — E669 Obesity, unspecified: Secondary | ICD-10-CM | POA: Diagnosis not present

## 2018-03-18 DIAGNOSIS — E1142 Type 2 diabetes mellitus with diabetic polyneuropathy: Secondary | ICD-10-CM | POA: Diagnosis not present

## 2018-03-18 DIAGNOSIS — I10 Essential (primary) hypertension: Secondary | ICD-10-CM | POA: Diagnosis not present

## 2018-03-18 DIAGNOSIS — Z794 Long term (current) use of insulin: Secondary | ICD-10-CM | POA: Diagnosis not present

## 2018-03-18 LAB — HEMOGLOBIN A1C: Hemoglobin A1C: 8.8

## 2018-03-31 DIAGNOSIS — L84 Corns and callosities: Secondary | ICD-10-CM | POA: Diagnosis not present

## 2018-03-31 DIAGNOSIS — L603 Nail dystrophy: Secondary | ICD-10-CM | POA: Diagnosis not present

## 2018-03-31 DIAGNOSIS — E1151 Type 2 diabetes mellitus with diabetic peripheral angiopathy without gangrene: Secondary | ICD-10-CM | POA: Diagnosis not present

## 2018-03-31 DIAGNOSIS — I739 Peripheral vascular disease, unspecified: Secondary | ICD-10-CM | POA: Diagnosis not present

## 2018-04-01 ENCOUNTER — Ambulatory Visit (INDEPENDENT_AMBULATORY_CARE_PROVIDER_SITE_OTHER): Payer: Medicare Other | Admitting: Family Medicine

## 2018-04-01 ENCOUNTER — Encounter: Payer: Self-pay | Admitting: Family Medicine

## 2018-04-01 VITALS — BP 124/58 | HR 91 | Temp 97.9°F | Wt 214.8 lb

## 2018-04-01 DIAGNOSIS — E1169 Type 2 diabetes mellitus with other specified complication: Secondary | ICD-10-CM | POA: Diagnosis not present

## 2018-04-01 DIAGNOSIS — E785 Hyperlipidemia, unspecified: Secondary | ICD-10-CM

## 2018-04-01 DIAGNOSIS — E291 Testicular hypofunction: Secondary | ICD-10-CM

## 2018-04-01 DIAGNOSIS — E1165 Type 2 diabetes mellitus with hyperglycemia: Secondary | ICD-10-CM | POA: Diagnosis not present

## 2018-04-01 DIAGNOSIS — M609 Myositis, unspecified: Secondary | ICD-10-CM | POA: Diagnosis not present

## 2018-04-01 DIAGNOSIS — Z794 Long term (current) use of insulin: Secondary | ICD-10-CM

## 2018-04-01 DIAGNOSIS — E039 Hypothyroidism, unspecified: Secondary | ICD-10-CM

## 2018-04-01 DIAGNOSIS — I10 Essential (primary) hypertension: Secondary | ICD-10-CM

## 2018-04-01 HISTORY — DX: Testicular hypofunction: E29.1

## 2018-04-01 LAB — TESTOSTERONE: Testosterone: 351.57 ng/dL (ref 300.00–890.00)

## 2018-04-01 LAB — COMPREHENSIVE METABOLIC PANEL
ALK PHOS: 70 U/L (ref 39–117)
ALT: 32 U/L (ref 0–53)
AST: 23 U/L (ref 0–37)
Albumin: 4.6 g/dL (ref 3.5–5.2)
BUN: 31 mg/dL — AB (ref 6–23)
CHLORIDE: 105 meq/L (ref 96–112)
CO2: 23 mEq/L (ref 19–32)
CREATININE: 1.63 mg/dL — AB (ref 0.40–1.50)
Calcium: 9.8 mg/dL (ref 8.4–10.5)
GFR: 54.15 mL/min — ABNORMAL LOW (ref >60.00)
GLUCOSE: 227 mg/dL — AB (ref 70–99)
POTASSIUM: 4.1 meq/L (ref 3.5–5.1)
SODIUM: 138 meq/L (ref 135–145)
TOTAL PROTEIN: 6.7 g/dL (ref 6.0–8.3)
Total Bilirubin: 0.6 mg/dL (ref 0.2–1.2)

## 2018-04-01 LAB — TSH: TSH: 2.06 u[IU]/mL (ref 0.35–4.50)

## 2018-04-01 LAB — LDL CHOLESTEROL, DIRECT: Direct LDL: 72 mg/dL

## 2018-04-01 NOTE — Assessment & Plan Note (Signed)
Update TSH

## 2018-04-01 NOTE — Assessment & Plan Note (Signed)
-  BP is well controlled.  Continue losartan.

## 2018-04-01 NOTE — Patient Instructions (Signed)
Great to see you! We'll be in touch with lab results.  Happy Holidays and Happy New Year! I will see you back in 6 months or sooner if needed.

## 2018-04-01 NOTE — Assessment & Plan Note (Signed)
-  Managed by endocrinology -Chronic prednisone making sugars more difficult to control.  Empagliflozin increased recently.

## 2018-04-01 NOTE — Assessment & Plan Note (Signed)
-  Follows with rheumatology through the [place], controlled currently.

## 2018-04-01 NOTE — Assessment & Plan Note (Signed)
-  He has started topical testosterone rx through the [place].  -update testosterone levels today.

## 2018-04-01 NOTE — Progress Notes (Signed)
[name] - 67 y.o. male MRN [id number]  Date of birth: [birth year]  Subjective Chief Complaint  Patient presents with  . Follow-up    HPI [name] is a 67 y.o. male with history of T2DM, Hypothyroidism, myositis, HTN and HLD here today for follow up.  He continues to participate in dual care with the [place] and has rx filled via [facility].     -T2DM:  Followed and managed by endocrinology.  Current treatment with lantus and empagliflozin.  Doing well with current medications.  He is on chronic steroids as well for management of myositis which have caused sugars to be more elevated as well.   -HTN:  Current treatment with losartan.  Doing well and denies side effects from medications.  He is without anginal symptoms, shortness of breath, palpitations, headache or vision changes.   -Hypothyroid;  Compliant with levothyroxine.  Denies symptoms of hypo/hyper-thyroidism.    -Hypogonadism:  Testosterone levels were very low on last check.  He reports that endocrinologist declined to address or treat this.  He reports that his [place] started him on topical testosterone supplement.  Has not had levels checked since starting this several weeks ago.    -Myositis:  He is being followed by rheumatology through the [place].  Treated with MTX and prednisone.  Denies increased pain.  He does have some weakness in the R arm but told this was from neuropathy due to his DM.    ROS:  A comprehensive ROS was completed and negative except as noted per HPI  Allergies  Allergen Reactions  . Hydrocodone   . Shellfish Allergy   . Ultram [Tramadol]     "shakes"    Past Medical History:  Diagnosis Date  . Autoimmune disease ([place])   . Diabetes mellitus without complication ([place])   . GERD (gastroesophageal reflux disease)   . Hypothyroidism   . Joint pain   . PTSD (post-traumatic stress disorder)     Past Surgical History:  Procedure Laterality Date  . BIOPSY SHOULDER     Left    Social History     Socioeconomic History  . Marital status: Married    Spouse name: Not on file  . Number of children: 4  . Years of education: 90  . Highest education level: Not on file  Occupational History  . Occupation: Retired  Scientific laboratory technician  . Financial resource strain: Not on file  . Food insecurity:    Worry: Not on file    Inability: Not on file  . Transportation needs:    Medical: Not on file    Non-medical: Not on file  Tobacco Use  . Smoking status: Never Smoker  . Smokeless tobacco: Never Used  Substance and Sexual Activity  . Alcohol use: No  . Drug use: No  . Sexual activity: Not on file  Lifestyle  . Physical activity:    Days per week: Not on file    Minutes per session: Not on file  . Stress: Not on file  Relationships  . Social connections:    Talks on phone: Not on file    Gets together: Not on file    Attends religious service: Not on file    Active member of club or organization: Not on file    Attends meetings of clubs or organizations: Not on file    Relationship status: Not on file  Other Topics Concern  . Not on file  Social History Narrative   Lives at  home with his wife and son.   Right-handed.   2 cups caffeine per day.    Family History  Problem Relation Age of Onset  . Hypertension Mother   . Diabetes Mother   . Arthritis Mother   . Depression Mother   . Heart attack Father   . Hypertension Father     Health Maintenance  Topic Date Due  . HEMOGLOBIN A1C  1950-06-23  . FOOT EXAM  10/29/1958  . OPHTHALMOLOGY EXAM  10/29/1958  . TETANUS/TDAP  10/29/1967  . COLONOSCOPY  10/29/1998  . PNA vac Low Risk Adult (1 of 2 - PCV13) 10/28/2013  . INFLUENZA VACCINE  11/28/2017  . Hepatitis C Screening  Completed    ----------------------------------------------------------------------------------------------------------------------------------------------------------------------------------------------------------------- Physical Exam BP (!) 124/58    Pulse 91   Temp 97.9 F (36.6 C) (Oral)   Wt 214 lb 12.8 oz (97.4 kg)   SpO2 98%   BMI 31.27 kg/m   Physical Exam  Constitutional: He is oriented to person, place, and time. He appears well-nourished. No distress.  HENT:  Head: Normocephalic and atraumatic.  Mouth/Throat: Oropharynx is clear and moist.  Eyes: No scleral icterus.  Neck: Normal range of motion. Neck supple. No thyromegaly present.  Cardiovascular: Normal rate, regular rhythm, normal heart sounds and intact distal pulses.  Pulmonary/Chest: Effort normal and breath sounds normal.  Lymphadenopathy:    He has no cervical adenopathy.  Neurological: He is alert and oriented to person, place, and time.  Skin: Skin is warm and dry.  Psychiatric: He has a normal mood and affect. His behavior is normal.    ------------------------------------------------------------------------------------------------------------------------------------------------------------------------------------------------------------------- Assessment and Plan  HTN (hypertension) -BP is well controlled.  Continue losartan.   Hypothyroidism -Update TSH  Hypogonadism in male -He has started topical testosterone rx through the [place].  -update testosterone levels today.   DM2 (diabetes mellitus, type 2) -Managed by endocrinology -Chronic prednisone making sugars more difficult to control.  Empagliflozin increased recently.   Myositis -Follows with rheumatology through the [place], controlled currently.

## 2018-04-03 NOTE — Progress Notes (Signed)
Stable labs , continue current medications

## 2018-04-04 ENCOUNTER — Telehealth: Payer: Self-pay | Admitting: *Deleted

## 2018-04-04 NOTE — Telephone Encounter (Signed)
Labs printed and left up front in folder for patient to pick up    Copied from [place] [id number]. Topic: General - Other >> Apr 04, 2018 10:47 AM Carolyn Stare wrote:  Pt would like to pick up a copy his his recent labs and ask that a copy be left up front and he will pick up after 2pm today

## 2018-05-12 DIAGNOSIS — M12272 Villonodular synovitis (pigmented), left ankle and foot: Secondary | ICD-10-CM | POA: Diagnosis not present

## 2018-05-12 DIAGNOSIS — M12271 Villonodular synovitis (pigmented), right ankle and foot: Secondary | ICD-10-CM | POA: Diagnosis not present

## 2018-05-12 DIAGNOSIS — M216X1 Other acquired deformities of right foot: Secondary | ICD-10-CM | POA: Diagnosis not present

## 2018-06-04 ENCOUNTER — Encounter: Payer: Self-pay | Admitting: Family Medicine

## 2018-06-04 ENCOUNTER — Ambulatory Visit (INDEPENDENT_AMBULATORY_CARE_PROVIDER_SITE_OTHER): Payer: Medicare Other | Admitting: Family Medicine

## 2018-06-04 DIAGNOSIS — K219 Gastro-esophageal reflux disease without esophagitis: Secondary | ICD-10-CM

## 2018-06-04 MED ORDER — PANTOPRAZOLE SODIUM 40 MG PO TBEC: 40.0000 mg | DELAYED_RELEASE_TABLET | Freq: Every day | ORAL | 2 refills | Status: DC

## 2018-06-04 NOTE — Progress Notes (Signed)
[name] - 68 y.o. male MRN [id number]  Date of birth: [birth year]  Subjective Chief Complaint  Patient presents with  . Abdominal Pain    reflux and abdominal pain ongoing for three weeks. Admits to Nausea. Has been taking prilosec daily    HPI [name] is a 68 y.o. male here today for follow up of GERD.  He reports increased symptoms of GERD with abdominal pain for about three weeks.  He is taking prilosec daily but this is not controlling his symptoms very well.  He has been taking TUMS and maalox in addition to prilosec.  Symptoms worse when laying down at night.  Also has had some bloating and mild nausea.  He denies vomiting, fever, chills or changes to stool.   ROS:  A comprehensive ROS was completed and negative except as noted per HPI  Allergies  Allergen Reactions  . Hydrocodone   . Shellfish Allergy   . Tramadol Hcl Other (See Comments)  . Ultram [Tramadol]     "shakes"    Past Medical History:  Diagnosis Date  . Autoimmune disease ([place])   . Diabetes mellitus without complication ([place])   . GERD (gastroesophageal reflux disease)   . Hypothyroidism   . Joint pain   . PTSD (post-traumatic stress disorder)     Past Surgical History:  Procedure Laterality Date  . BIOPSY SHOULDER     Left    Social History   Socioeconomic History  . Marital status: Married    Spouse name: Not on file  . Number of children: 4  . Years of education: 41  . Highest education level: Not on file  Occupational History  . Occupation: Retired  Scientific laboratory technician  . Financial resource strain: Not on file  . Food insecurity:    Worry: Not on file    Inability: Not on file  . Transportation needs:    Medical: Not on file    Non-medical: Not on file  Tobacco Use  . Smoking status: Never Smoker  . Smokeless tobacco: Never Used  Substance and Sexual Activity  . Alcohol use: No  . Drug use: No  . Sexual activity: Not on file  Lifestyle  . Physical activity:    Days per week: Not  on file    Minutes per session: Not on file  . Stress: Not on file  Relationships  . Social connections:    Talks on phone: Not on file    Gets together: Not on file    Attends religious service: Not on file    Active member of club or organization: Not on file    Attends meetings of clubs or organizations: Not on file    Relationship status: Not on file  Other Topics Concern  . Not on file  Social History Narrative   Lives at home with his wife and son.   Right-handed.   2 cups caffeine per day.    Family History  Problem Relation Age of Onset  . Hypertension Mother   . Diabetes Mother   . Arthritis Mother   . Depression Mother   . Heart attack Father   . Hypertension Father     Health Maintenance  Topic Date Due  . HEMOGLOBIN A1C  17-Jul-1950  . FOOT EXAM  10/29/1958  . OPHTHALMOLOGY EXAM  10/29/1958  . TETANUS/TDAP  10/29/1967  . COLONOSCOPY  10/29/1998  . PNA vac Low Risk Adult (1 of 2 - PCV13) 10/28/2013  . INFLUENZA VACCINE  11/28/2017  .  Hepatitis C Screening  Completed    ----------------------------------------------------------------------------------------------------------------------------------------------------------------------------------------------------------------- Physical Exam BP 118/76   Pulse 76   Temp 97.9 F (36.6 C) (Oral)   Ht 5' 9.5" (1.765 m)   Wt 212 lb (96.2 kg)   SpO2 98%   BMI 30.86 kg/m   Physical Exam Constitutional:      Appearance: He is well-developed.  HENT:     Head: Normocephalic and atraumatic.     Mouth/Throat:     Mouth: Mucous membranes are moist.  Eyes:     General: No scleral icterus. Neck:     Musculoskeletal: Neck supple.  Cardiovascular:     Rate and Rhythm: Normal rate and regular rhythm.  Pulmonary:     Effort: Pulmonary effort is normal.     Breath sounds: Normal breath sounds.  Abdominal:     General: Bowel sounds are normal. There is no distension.     Palpations: There is no mass.      Tenderness: There is abdominal tenderness (mild epigastric ttp). There is no guarding or rebound.  Skin:    General: Skin is warm and dry.  Neurological:     General: No focal deficit present.     Mental Status: He is alert.  Psychiatric:        Mood and Affect: Mood normal.        Behavior: Behavior normal.     ------------------------------------------------------------------------------------------------------------------------------------------------------------------------------------------------------------------- Assessment and Plan  GERD (gastroesophageal reflux disease) -Change prilosec to pantoprazole -Discussed that symptoms could be related to gastroparesis as well.  -F/u with me in 2-3 weeks.

## 2018-06-04 NOTE — Assessment & Plan Note (Signed)
-  Change prilosec to pantoprazole -Discussed that symptoms could be related to gastroparesis as well.  -F/u with me in 2-3 weeks.

## 2018-06-04 NOTE — Patient Instructions (Signed)

## 2018-06-16 DIAGNOSIS — E1151 Type 2 diabetes mellitus with diabetic peripheral angiopathy without gangrene: Secondary | ICD-10-CM | POA: Diagnosis not present

## 2018-06-16 DIAGNOSIS — L603 Nail dystrophy: Secondary | ICD-10-CM | POA: Diagnosis not present

## 2018-06-16 DIAGNOSIS — I739 Peripheral vascular disease, unspecified: Secondary | ICD-10-CM | POA: Diagnosis not present

## 2018-06-16 DIAGNOSIS — L84 Corns and callosities: Secondary | ICD-10-CM | POA: Diagnosis not present

## 2018-06-20 ENCOUNTER — Encounter: Payer: Self-pay | Admitting: Family Medicine

## 2018-06-20 ENCOUNTER — Ambulatory Visit (INDEPENDENT_AMBULATORY_CARE_PROVIDER_SITE_OTHER): Payer: Medicare Other | Admitting: Family Medicine

## 2018-06-20 VITALS — BP 142/78 | HR 65 | Temp 97.9°F | Ht 69.5 in | Wt 215.0 lb

## 2018-06-20 DIAGNOSIS — R21 Rash and other nonspecific skin eruption: Secondary | ICD-10-CM | POA: Diagnosis not present

## 2018-06-20 DIAGNOSIS — L299 Pruritus, unspecified: Secondary | ICD-10-CM

## 2018-06-20 MED ORDER — METHYLPREDNISOLONE ACETATE 40 MG/ML IJ SUSP
40.0000 mg | Freq: Once | INTRAMUSCULAR | Status: AC
  Administered 2018-06-20: 40 mg via INTRAMUSCULAR

## 2018-06-20 NOTE — Patient Instructions (Signed)
You may use the hydroxyzine with gabapentin if needed Keep appt with allergist.

## 2018-06-20 NOTE — Progress Notes (Signed)
[name] - 68 y.o. male MRN [id number]  Date of birth: May 31, 1950  Subjective Chief Complaint  Patient presents with  . Dermatitis    has not improved    HPI [name] is a 68 y.o. male here today for f/u of chronic pruritis/urticaria.  He had been using vistaril which was helpful. However stopped using this when he was started on gabapentin.  He had been doing ok for several weeks until about 2 weeks ago when itching returned.  He was not sure if he could take vistaril with gabapentin so he has not tried.  He is scratching to the point of bleeding sometimes.  He does have an appointment with an allergist in a couple weeks.   ROS:  A comprehensive ROS was completed and negative except as noted per HPI  Allergies  Allergen Reactions  . Hydrocodone   . Shellfish Allergy   . Tramadol Hcl Other (See Comments)  . Ultram [Tramadol]     "shakes"    Past Medical History:  Diagnosis Date  . Autoimmune disease ([place])   . Diabetes mellitus without complication ([place])   . GERD (gastroesophageal reflux disease)   . Hypothyroidism   . Joint pain   . PTSD (post-traumatic stress disorder)     Past Surgical History:  Procedure Laterality Date  . BIOPSY SHOULDER     Left    Social History   Socioeconomic History  . Marital status: Married    Spouse name: Not on file  . Number of children: 4  . Years of education: 44  . Highest education level: Not on file  Occupational History  . Occupation: Retired  Scientific laboratory technician  . Financial resource strain: Not on file  . Food insecurity:    Worry: Not on file    Inability: Not on file  . Transportation needs:    Medical: Not on file    Non-medical: Not on file  Tobacco Use  . Smoking status: Never Smoker  . Smokeless tobacco: Never Used  Substance and Sexual Activity  . Alcohol use: No  . Drug use: No  . Sexual activity: Not on file  Lifestyle  . Physical activity:    Days per week: Not on file    Minutes per session: Not on file   . Stress: Not on file  Relationships  . Social connections:    Talks on phone: Not on file    Gets together: Not on file    Attends religious service: Not on file    Active member of club or organization: Not on file    Attends meetings of clubs or organizations: Not on file    Relationship status: Not on file  Other Topics Concern  . Not on file  Social History Narrative   Lives at home with his wife and son.   Right-handed.   2 cups caffeine per day.    Family History  Problem Relation Age of Onset  . Hypertension Mother   . Diabetes Mother   . Arthritis Mother   . Depression Mother   . Heart attack Father   . Hypertension Father     Health Maintenance  Topic Date Due  . HEMOGLOBIN A1C  [birth year]  . FOOT EXAM  10/29/1958  . OPHTHALMOLOGY EXAM  10/29/1958  . TETANUS/TDAP  10/29/1967  . COLONOSCOPY  10/29/1998  . PNA vac Low Risk Adult (1 of 2 - PCV13) 10/28/2013  . INFLUENZA VACCINE  11/28/2017  . Hepatitis C Screening  Completed    ----------------------------------------------------------------------------------------------------------------------------------------------------------------------------------------------------------------- Physical Exam BP (!) 142/78   Pulse 65   Temp 97.9 F (36.6 C) (Oral)   Ht 5' 9.5" (1.765 m)   Wt 215 lb (97.5 kg)   SpO2 95%   BMI 31.29 kg/m   Physical Exam Constitutional:      Appearance: Normal appearance.  HENT:     Head: Normocephalic and atraumatic.     Mouth/Throat:     Mouth: Mucous membranes are moist.  Neck:     Musculoskeletal: Neck supple.  Cardiovascular:     Rate and Rhythm: Normal rate and regular rhythm.  Pulmonary:     Effort: Pulmonary effort is normal.     Breath sounds: Normal breath sounds.  Skin:    Comments: Scattered raised hive-like lesions on upper arms and neck.   Neurological:     General: No focal deficit present.     Mental Status: He is alert.  Psychiatric:        Mood and  Affect: Mood normal.        Behavior: Behavior normal.     ------------------------------------------------------------------------------------------------------------------------------------------------------------------------------------------------------------------- Assessment and Plan  Itching -Chronic, has upcoming appt with allergist -Given injection of depo-medrol today -Continue hydroxyzine, ok to use with gabapentin prn.

## 2018-06-20 NOTE — Assessment & Plan Note (Signed)
-  Chronic, has upcoming appt with allergist -Given injection of depo-medrol today -Continue hydroxyzine, ok to use with gabapentin prn.

## 2018-06-25 ENCOUNTER — Encounter: Payer: Self-pay | Admitting: Family Medicine

## 2018-06-25 ENCOUNTER — Ambulatory Visit (INDEPENDENT_AMBULATORY_CARE_PROVIDER_SITE_OTHER): Payer: Medicare Other | Admitting: Family Medicine

## 2018-06-25 ENCOUNTER — Ambulatory Visit: Payer: Medicare Other | Admitting: Family Medicine

## 2018-06-25 DIAGNOSIS — L299 Pruritus, unspecified: Secondary | ICD-10-CM

## 2018-06-25 DIAGNOSIS — K219 Gastro-esophageal reflux disease without esophagitis: Secondary | ICD-10-CM | POA: Diagnosis not present

## 2018-06-29 ENCOUNTER — Encounter: Payer: Self-pay | Admitting: Family Medicine

## 2018-06-29 NOTE — Assessment & Plan Note (Signed)
Improved with pantoprazole, continue.

## 2018-06-29 NOTE — Progress Notes (Signed)
[name] - 68 y.o. male MRN [id number]  Date of birth: 1950-07-03  Subjective Chief Complaint  Patient presents with  . Follow-up    GERD has improved. Still experiencing dermatitis all over.     HPI [name] is a 68 y.o. male here today for f/u of GERD and pruritis.  He reports that GERD has improved significantly since starting protonix.  He denies nausea, vomiting, abdominal pain, melena.  His pruritis unfortunately has not improved despite vistaril and recent steroid injection.  He has seen dermatology and allergist for this in the past without any proven causes.  In addition to antihistamines and steroid he has tried multiple topical steroids and moisturizers.    ROS:  A comprehensive ROS was completed and negative except as noted per HPI    Allergies  Allergen Reactions  . Hydrocodone   . Shellfish Allergy   . Tramadol Hcl Other (See Comments)  . Ultram [Tramadol]     "shakes"    Past Medical History:  Diagnosis Date  . Autoimmune disease ([place])   . Diabetes mellitus without complication ([place])   . GERD (gastroesophageal reflux disease)   . Hypothyroidism   . Joint pain   . PTSD (post-traumatic stress disorder)     Past Surgical History:  Procedure Laterality Date  . BIOPSY SHOULDER     Left    Social History   Socioeconomic History  . Marital status: Married    Spouse name: Not on file  . Number of children: 4  . Years of education: 12  . Highest education level: Not on file  Occupational History  . Occupation: Retired  Scientific laboratory technician  . Financial resource strain: Not on file  . Food insecurity:    Worry: Not on file    Inability: Not on file  . Transportation needs:    Medical: Not on file    Non-medical: Not on file  Tobacco Use  . Smoking status: Never Smoker  . Smokeless tobacco: Never Used  Substance and Sexual Activity  . Alcohol use: No  . Drug use: No  . Sexual activity: Not on file  Lifestyle  . Physical activity:    Days per week:  Not on file    Minutes per session: Not on file  . Stress: Not on file  Relationships  . Social connections:    Talks on phone: Not on file    Gets together: Not on file    Attends religious service: Not on file    Active member of club or organization: Not on file    Attends meetings of clubs or organizations: Not on file    Relationship status: Not on file  Other Topics Concern  . Not on file  Social History Narrative   Lives at home with his wife and son.   Right-handed.   2 cups caffeine per day.    Family History  Problem Relation Age of Onset  . Hypertension Mother   . Diabetes Mother   . Arthritis Mother   . Depression Mother   . Heart attack Father   . Hypertension Father     Health Maintenance  Topic Date Due  . HEMOGLOBIN A1C  09/14/48  . FOOT EXAM  10/29/1958  . OPHTHALMOLOGY EXAM  10/29/1958  . TETANUS/TDAP  10/29/1967  . COLONOSCOPY  10/29/1998  . PNA vac Low Risk Adult (1 of 2 - PCV13) 10/28/2013  . INFLUENZA VACCINE  11/28/2017  . Hepatitis C Screening  Completed    -----------------------------------------------------------------------------------------------------------------------------------------------------------------------------------------------------------------  Physical Exam BP 138/74   Pulse 88   Temp 98.2 F (36.8 C) (Oral)   Ht 5' 9.5" (1.765 m)   Wt 217 lb (98.4 kg)   SpO2 96%   BMI 31.59 kg/m   Physical Exam Constitutional:      Appearance: Normal appearance.  HENT:     Head: Normocephalic and atraumatic.  Eyes:     General: No scleral icterus. Cardiovascular:     Rate and Rhythm: Normal rate and regular rhythm.  Pulmonary:     Effort: Pulmonary effort is normal.     Breath sounds: Normal breath sounds.  Skin:    General: Skin is warm and dry.     Comments: Dry, excoriated areas on bilateral arms  Neurological:     General: No focal deficit present.     Mental Status: He is alert and oriented to person, place, and  time.  Psychiatric:        Mood and Affect: Mood normal.        Behavior: Behavior normal.     ------------------------------------------------------------------------------------------------------------------------------------------------------------------------------------------------------------------- Assessment and Plan  Itching -May be related to a number of his current medication (MTX, glipizide, etc).  Unfortunately the only way to tell would be to discontinue and slowly add back on individually. -Discussed referral to dermatology outside of [place], he would like to hold on this for the time being.   -No other symptoms suggestive of other etiology such as celiac disease or scabies.    GERD (gastroesophageal reflux disease) Improved with pantoprazole, continue.

## 2018-06-29 NOTE — Assessment & Plan Note (Signed)
-  May be related to a number of his current medication (MTX, glipizide, etc).  Unfortunately the only way to tell would be to discontinue and slowly add back on individually. -Discussed referral to dermatology outside of [place], he would like to hold on this for the time being.   -No other symptoms suggestive of other etiology such as celiac disease or scabies.

## 2018-07-01 ENCOUNTER — Telehealth: Payer: Self-pay

## 2018-07-01 ENCOUNTER — Other Ambulatory Visit: Payer: Self-pay | Admitting: Family Medicine

## 2018-07-01 DIAGNOSIS — L299 Pruritus, unspecified: Secondary | ICD-10-CM

## 2018-07-01 NOTE — Telephone Encounter (Signed)
Referral entered  

## 2018-07-01 NOTE — Telephone Encounter (Signed)
Spoke with pt and informed him of Dr. Zigmund Daniel message, pt understood and had no additional questions at this time.

## 2018-07-01 NOTE — Telephone Encounter (Signed)
Copied from [place] [id number]. Topic: Referral - Request for Referral >> Jul 01, 2018  9:21 AM Nils Flack wrote: Has patient seen PCP for this complaint? Yes.   *If NO, is insurance requiring patient see PCP for this issue before PCP can refer them? Referral for which specialty: allergist  Preferred provider/office: [place]  Reason for referral: needs to see allergist as soon as possible

## 2018-07-10 ENCOUNTER — Ambulatory Visit (INDEPENDENT_AMBULATORY_CARE_PROVIDER_SITE_OTHER): Payer: Medicare Other | Admitting: Allergy

## 2018-07-10 ENCOUNTER — Encounter: Payer: Self-pay | Admitting: Allergy

## 2018-07-10 ENCOUNTER — Other Ambulatory Visit: Payer: Self-pay

## 2018-07-10 VITALS — BP 124/70 | HR 93 | Temp 97.5°F | Resp 16 | Ht 67.0 in | Wt 211.4 lb

## 2018-07-10 DIAGNOSIS — L309 Dermatitis, unspecified: Secondary | ICD-10-CM | POA: Diagnosis not present

## 2018-07-10 DIAGNOSIS — T7800XD Anaphylactic reaction due to unspecified food, subsequent encounter: Secondary | ICD-10-CM | POA: Diagnosis not present

## 2018-07-10 DIAGNOSIS — L299 Pruritus, unspecified: Secondary | ICD-10-CM | POA: Diagnosis not present

## 2018-07-10 MED ORDER — CRISABOROLE 2 % EX OINT: 1.0000 "application " | TOPICAL_OINTMENT | Freq: Two times a day (BID) | CUTANEOUS | 12 refills | Status: DC

## 2018-07-10 MED ORDER — TRIAMCINOLONE 0.1 % CREAM:EUCERIN CREAM 1:1: 1.0000 "application " | TOPICAL_CREAM | Freq: Two times a day (BID) | CUTANEOUS | 12 refills | Status: DC

## 2018-07-10 MED ORDER — TRIAMCINOLONE ACETONIDE 0.1 % EX CREA: 1.0000 "application " | TOPICAL_CREAM | Freq: Two times a day (BID) | CUTANEOUS | 0 refills | Status: DC

## 2018-07-10 NOTE — Patient Instructions (Addendum)
Itch and dermatitis  - will review records and previously performed labs and biopsy results  - continue Hydroxyzine and Gabapentin as you have been taking  - use Triamcinolone 0.1% cream twice a day as needed to areas of itch/dry/rough patches  - use Eucrisa (non-steroidal cream) to twice as a day as needed to areas of itch/dry/rough patches.   Can use anywhere on body.  Can be layered with topical steroid creams as well.    - we discussed Dupixent injectable medication for control of itch and rash.  Benefits and risks discussed today.  It is an injectable medication that is administered every 2 weeks and can be done at home.  Brochure provided.  Let us know if you would like to proceed with this therapy.    Shellfish allergy  - continue avoidance of shellfish and recommend he have access to epinephrine device  Follow-up 2-3 months or sooner if needed

## 2018-07-10 NOTE — Progress Notes (Signed)
New Patient Note  RE: [name] MRN: [id number] DOB: [birth year] Date of Office Visit: 07/10/2018  Referring provider: [name], DO Primary care provider: [name], DO  Chief Complaint: itch  History of present illness: [name] is a 68 y.o. male presenting today for consultation for pruritus.   He has been having severe itching for about 9-12 months now.  He states it has gotten very severe at points.  The itch can be all over but states the worse areas include his legs, groin area, arms and back.  He has also noted small bumps and some areas of dry patchy areas that come and go.  The itching progresses throughout the day and is worse at night.  He states he has been to variety of different doctors including allergist at [facility] and dermatology and has not gotten any relief of his itch.  He started using oatmeal and baking soda baths which helps somewhat. He has been using a HC OTC cream.  He states he has not been recommended to use any prescription based steroid or non-steroidal agents.  He has used variety of OTC lotions including Sarna which he states didn't help.  He has received steroid injections which also have not help.    He states he has seen allergist at [facility] within the last 6 months.  He reports he did variety of different testing including allergy testing and general blood tests (at time of this encounter these records were not available).  He states all of the lab work he has had done has been unrevealing.  He has had biopsy done by the dermatologist, Dr. Ubaldo Glassing (I unfortunately do not have these results either).  He checked he was checked and treated for both scabies and bed bugs which did not change his itch.       He has history of diabetes and has neuropathy of his legs and was recommended gabapentin for that.  Due to his itch he was recommended to increase his gabapentin dose to TID dosing.  He feels this helps a little bit with his itch control.  He feels the  combination of gabapentin with hydroxyzine has worked the best however he still reports a lot of generalized itch.  He has had to increase his hydroxyzine to 50mg  twice a day which he states does not have that much of a drowsing effect.    He denies any weight changes, fevers, lumps, N/V/D or respiratory symptoms.  He states he travels a lot but and had been traveling with his wife at some point prior to onset of symptoms and recalls going to [place] and [place].  No similar symptoms in household members.    He has a shellfish allergy. At this time he is not interested in testing.  Denies history of asthma or seasonal/perennial allergic rhinitis/conjunctivitis.   Review of systems: Review of Systems  Constitutional: Negative for chills, fever, malaise/fatigue and weight loss.  HENT: Negative for congestion, ear discharge, nosebleeds and sore throat.   Eyes: Negative for pain, discharge and redness.  Respiratory: Negative for cough, shortness of breath and wheezing.   Cardiovascular: Negative for chest pain.  Gastrointestinal: Negative for abdominal pain, constipation, diarrhea, heartburn, nausea and vomiting.  Musculoskeletal: Negative for joint pain.  Skin: Positive for itching and rash.  Neurological: Negative for headaches.    All other systems negative unless noted above in HPI  Past medical history: Past Medical History:  Diagnosis Date  . Autoimmune disease ([place])   .  Diabetes mellitus without complication ([place])   . GERD (gastroesophageal reflux disease)   . Hypothyroidism   . Joint pain   . PTSD (post-traumatic stress disorder)     Past surgical history: Past Surgical History:  Procedure Laterality Date  . BIOPSY SHOULDER     Left    Family history:  Family History  Problem Relation Age of Onset  . Hypertension Mother   . Diabetes Mother   . Arthritis Mother   . Depression Mother   . Heart attack Father   . Hypertension Father     Social history: Lives in a  home with his wife with carpeting with electric heating and central cooling.  No pets in the home.  There is a dog outside the home.  There is no concern for water damage, mildew or roaches in the home.  He is retired since 2003.  He denies a smoking history.  Medication List: Allergies as of 07/10/2018      Reactions   Hydrocodone    Shellfish Allergy    Tramadol Hcl Other (See Comments)   Ultram [tramadol]    "shakes"      Medication List       Accurate as of July 10, 2018  7:07 PM. Always use your most recent med list.        acarbose 100 MG tablet Commonly known as:  PRECOSE Take 100 mg by mouth 3 (three) times daily with meals.   Artificial Tears 1.4 % ophthalmic solution Generic drug:  polyvinyl alcohol 1 drop as needed for dry eyes.   calcium carbonate 600 MG Tabs tablet Commonly known as:  OS-CAL Take 600 mg by mouth 2 (two) times daily with a meal.   Crisaborole 2 % Oint Commonly known as:  N[place]al Apply 1 application topically 2 (two) times daily.   empagliflozin 25 MG Tabs tablet Commonly known as:  JARDIANCE Take by mouth.   folic acid 1 MG tablet Commonly known as:  FOLVITE Take 1 mg by mouth daily.   gabapentin 100 MG capsule Commonly known as:  NEURONTIN Take 100 mg by mouth 3 (three) times daily. TID at bedtime   glipiZIDE 10 MG 24 hr tablet Commonly known as:  GLUCOTROL XL Take 10 mg by mouth 2 (two) times daily.   hydrOXYzine 25 MG tablet Commonly known as:  ATARAX/VISTARIL Take 25 mg by mouth 3 (three) times daily as needed.   insulin glargine 100 UNIT/ML injection Commonly known as:  LANTUS Inject 0.46-0.56 mLs (46-56 Units total) into the skin at bedtime. For now take 30 units twice a day and then increase to your usual dose as your oral intake improves. Increase both doses by 2units every day. Check your blood sugars at home three times daily. Call your doctor if sugar level is more than 300 or less than 80.   Levothyroxine Sodium 88  MCG Caps Take by mouth.   losartan 50 MG tablet Commonly known as:  COZAAR Take 100 mg by mouth daily.   methotrexate 5 MG tablet Commonly known as:  RHEUMATREX Take 2 tablets (10 mg total) by mouth once a week. Caution: Chemotherapy. Protect from light. Take on fridays per patient.  OKAY TO RESUME FROM 10/08/14.   pantoprazole 40 MG tablet Commonly known as:  PROTONIX Take 1 tablet (40 mg total) by mouth daily.   predniSONE 5 MG tablet Commonly known as:  DELTASONE Take 6 mg by mouth daily with breakfast.   tamsulosin 0.4 MG Caps capsule  Commonly known as:  FLOMAX Take 0.4 mg by mouth as needed.   triamcinolone 0.1 % cream : eucerin Crea Apply 1 application topically 2 (two) times daily.   triamcinolone cream 0.1 % Commonly known as:  KENALOG Apply 1 application topically 2 (two) times daily.       Known medication allergies: Allergies  Allergen Reactions  . Hydrocodone   . Shellfish Allergy   . Tramadol Hcl Other (See Comments)  . Ultram [Tramadol]     "shakes"     Physical examination: Blood pressure 124/70, pulse 93, temperature (!) 97.5 F (36.4 C), temperature source Oral, resp. rate 16, height 5\' 7"  (1.702 m), weight 211 lb 6.4 oz (95.9 kg), SpO2 96 %.  General: Alert, interactive, in no acute distress. HEENT: PERRLA, TMs pearly gray, turbinates non-edematous without discharge, post-pharynx non erythematous. Neck: Supple without lymphadenopathy. Lungs: Clear to auscultation without wheezing, rhonchi or rales. {no increased work of breathing. CV: Normal S1, S2 without murmurs. Abdomen: Nondistended, nontender. Skin: There are areas of hyper hyperpigmentation over his arms and mid back that are small in nature but are clustered to form a larger patch. Extremities:  No clubbing, cyanosis or edema. Neuro:   Grossly intact.  Diagnositics/Labs: Labs:  Component     Latest Ref Rng & Units 04/01/2018  Sodium     135 - 145 mEq/L 138  Potassium     3.5 -  5.1 mEq/L 4.1  Chloride     96 - 112 mEq/L 105  CO2     19 - 32 mEq/L 23  Glucose     70 - 99 mg/dL 227 (H)  BUN     6 - 23 mg/dL 31 (H)  Creatinine     0.40 - 1.50 mg/dL 1.63 (H)  Total Bilirubin     0.2 - 1.2 mg/dL 0.6  Alkaline Phosphatase     39 - 117 U/L 70  AST     0 - 37 U/L 23  ALT     0 - 53 U/L 32  Total Protein     6.0 - 8.3 g/dL 6.7  Albumin     3.5 - 5.2 g/dL 4.6  Calcium     8.4 - 10.5 mg/dL 9.8  GFR     >60.00 mL/min 54.15 (L)  TSH     0.35 - 4.50 uIU/mL 2.06  Direct LDL     mg/dL 72.0  Testosterone     300.00 - 890.00 ng/dL 351.57   Component     Latest Ref Rng & Units 09/30/2017  WBC     4.0 - 10.5 K/uL 5.5  RBC     4.22 - 5.81 Mil/uL 4.09 (L)  Platelets     150.0 - 400.0 K/uL 140.0 (L)  Hemoglobin     13.0 - 17.0 g/dL 12.4 (L)  HCT     39.0 - 52.0 % 37.7 (L)  MCV     78.0 - 100.0 fl 92.3  MCHC     30.0 - 36.0 g/dL 32.8  RDW     11.5 - 15.5 % 14.6   Component     Latest Ref Rng & Units 09/30/2017  Hepatitis C Ab     NON-REACTI NON-REACTIVE  SIGNAL TO CUT-OFF     <1.00 0.04  Vitamin B12     211 - 911 pg/mL 422  Folate     >5.9 ng/mL >24.1  Ferritin     22.0 - 322.0 ng/mL 430.8 (H)   CXR  5.29.19 -  FINDINGS:  The cardiomediastinal silhouette is unremarkable and unchanged. Mild chronic peribronchial thickening noted. There is no evidence of focal airspace disease, pulmonary edema, suspicious pulmonary nodule/mass, pleural effusion, or pneumothorax. No acute bony abnormalities are identified.  IMPRESSION: No active cardiopulmonary disease.  Assessment and plan:   Pruritus and dermatitis  - will review records and previously performed labs and biopsy results - requested at time of visit  - continue Hydroxyzine and Gabapentin as you have been taking  - use Triamcinolone 0.1% cream twice a day as needed to areas of itch/dry/rough patches  - use Eucrisa (non-steroidal cream) to twice as a day as needed to areas of itch/dry/rough  patches.   Can use anywhere on body.  Can be layered with topical steroid creams as well.    - we discussed Dupixent injectable medication for control of itch and rash.  Benefits and risks discussed today.  It is an injectable medication that is administered every 2 weeks and can be done at home.  Given that he has had an eczematous like rash based on description in association with itch he may have good benefit with dupixent for symptom control.    Shellfish allergy  - continue avoidance of shellfish and recommend he have access to epinephrine device  Follow-up 2-3 months or sooner if needed  I appreciate the opportunity to take part in [name]'s care. Please do not hesitate to contact me with questions.  Sincerely,   Prudy Feeler, MD Allergy/Immunology [facility]

## 2018-07-11 ENCOUNTER — Telehealth: Payer: Self-pay

## 2018-07-11 ENCOUNTER — Other Ambulatory Visit: Payer: Self-pay

## 2018-07-11 MED ORDER — TRIAMCINOLONE 0.1 % CREAM:EUCERIN CREAM 1:1: 1.0000 "application " | TOPICAL_CREAM | Freq: Two times a day (BID) | CUTANEOUS | 12 refills | Status: DC

## 2018-07-11 NOTE — Telephone Encounter (Signed)
Rx printed, will resend

## 2018-07-11 NOTE — Telephone Encounter (Signed)
Pa for eucrisa 2% was rejected. Would you like [place] to send in another type of topical ointment?

## 2018-07-13 NOTE — Telephone Encounter (Signed)
I believe he was given samples.   Triamcinolone was sent in as well.

## 2018-07-14 NOTE — Telephone Encounter (Signed)
Noted  

## 2018-07-28 DIAGNOSIS — H34832 Tributary (branch) retinal vein occlusion, left eye, with macular edema: Secondary | ICD-10-CM | POA: Diagnosis not present

## 2018-07-28 DIAGNOSIS — H2513 Age-related nuclear cataract, bilateral: Secondary | ICD-10-CM | POA: Diagnosis not present

## 2018-07-28 DIAGNOSIS — E119 Type 2 diabetes mellitus without complications: Secondary | ICD-10-CM | POA: Diagnosis not present

## 2018-07-28 DIAGNOSIS — H35372 Puckering of macula, left eye: Secondary | ICD-10-CM | POA: Diagnosis not present

## 2018-09-30 ENCOUNTER — Telehealth: Payer: Self-pay | Admitting: *Deleted

## 2018-09-30 MED ORDER — TRIAMCINOLONE ACETONIDE 0.1 % EX CREA: TOPICAL_CREAM | CUTANEOUS | 0 refills | Status: DC

## 2018-09-30 NOTE — Telephone Encounter (Signed)
Left detailed message- received fax from pharmacy eucrisa not covered. Pa was rejected 3/13 and Dr. Nelva Bush sent in Triamcinolone for pt. Or pt could come by office for more samples if he would like.

## 2018-09-30 NOTE — Addendum Note (Signed)
Addended by: Katherina Right D on: 09/30/2018 11:54 AM   Modules accepted: Orders

## 2018-09-30 NOTE — Telephone Encounter (Signed)
Pt returned my call, restated message to him. He will go pick up prescription for triamcinolone. Left eucrisa samples at front desk in [place].

## 2018-10-01 ENCOUNTER — Encounter: Payer: Self-pay | Admitting: Family Medicine

## 2018-10-01 ENCOUNTER — Telehealth (INDEPENDENT_AMBULATORY_CARE_PROVIDER_SITE_OTHER): Payer: Medicare Other | Admitting: Family Medicine

## 2018-10-01 VITALS — BP 131/75 | HR 90 | Temp 97.7°F | Ht 67.0 in | Wt 213.0 lb

## 2018-10-01 DIAGNOSIS — M609 Myositis, unspecified: Secondary | ICD-10-CM

## 2018-10-01 DIAGNOSIS — E1165 Type 2 diabetes mellitus with hyperglycemia: Secondary | ICD-10-CM

## 2018-10-01 DIAGNOSIS — Z794 Long term (current) use of insulin: Secondary | ICD-10-CM

## 2018-10-01 DIAGNOSIS — I10 Essential (primary) hypertension: Secondary | ICD-10-CM

## 2018-10-01 DIAGNOSIS — E291 Testicular hypofunction: Secondary | ICD-10-CM | POA: Diagnosis not present

## 2018-10-01 DIAGNOSIS — K219 Gastro-esophageal reflux disease without esophagitis: Secondary | ICD-10-CM | POA: Diagnosis not present

## 2018-10-01 DIAGNOSIS — L299 Pruritus, unspecified: Secondary | ICD-10-CM

## 2018-10-01 DIAGNOSIS — E039 Hypothyroidism, unspecified: Secondary | ICD-10-CM

## 2018-10-01 MED ORDER — PANTOPRAZOLE SODIUM 40 MG PO TBEC: 40.0000 mg | DELAYED_RELEASE_TABLET | Freq: Every day | ORAL | 2 refills | Status: DC

## 2018-10-01 MED ORDER — HYDROXYZINE HCL 25 MG PO TABS: 25.0000 mg | ORAL_TABLET | Freq: Three times a day (TID) | ORAL | 1 refills | Status: DC | PRN

## 2018-10-01 NOTE — Assessment & Plan Note (Signed)
-  Hydroxyzine renewed.  -He will continue triamcinolone as needed as well.

## 2018-10-01 NOTE — Assessment & Plan Note (Signed)
-  Stable with current medications

## 2018-10-01 NOTE — Progress Notes (Signed)
[name] - 68 y.o. male MRN [id number]  Date of birth: 04-Feb-1951   This visit type was conducted due to national recommendations for restrictions regarding the COVID-19 Pandemic (e.g. social distancing).  This format is felt to be most appropriate for this patient at this time.  All issues noted in this document were discussed and addressed.  No physical exam was performed (except for noted visual exam findings with Video Visits).  I discussed the limitations of evaluation and management by telemedicine and the availability of in person appointments. The patient expressed understanding and agreed to proceed.  I connected with@ on 10/01/18 at 10:00 AM EDT by a video enabled telemedicine application and verified that I am speaking with the correct person using two identifiers.   Patient Location: Home [address] [place] [address]   Provider location:   [facility]  Chief Complaint  Patient presents with  . Follow-up    6 mo F/U HTN/HLD , Refills ?, Meds "great"     HPI  [name] is a 68 y.o. male who presents via audio/video conferencing for a telehealth visit today.  He is following up today for chronic medical conditions of diabetes, HTN, hypothyroid, hypogonadism and chronic pruritis.   -HTN:  Continues on losartan, denies side effects.  BP readings are well controlled at home.  Medication filled through [facility].  -Hypothyroid/DM:  Follows with endocrinology at [facility] for management of diabetes and thyroid.  Reports stable blood sugars, hovering around 180-200.  Last a1c in 02/2018 was 8.8%.  Has upcoming f/u w/ repeat labs.  Currently on multiple meds including insulin.  He denies symptoms of hypoglycemia.    -Hypogonadism:  History of low testosterone, previously treated with testosterone supplement however he is currently out.  Waiting to have this filled through the [place] but hasn't heard anything back.  May want me to take over rx for this if possible.    -Pruritis:  Seeing dermatology, improved with triamcinolone cream.  Had greatest improvement with [place] but cost is too high.  Still using vistaril as needed for itching as well, needs refill of this.   -GERD: improved with pantoprazole  -Myositis:  Followed by rheumatology, prednisone increased recently to 6mg  daily.  Continues on MTX as well.  Stable symptoms      ROS:  A comprehensive ROS was completed and negative except as noted per HPI  Past Medical History:  Diagnosis Date  . Autoimmune disease ([place])   . Diabetes mellitus without complication ([place])   . GERD (gastroesophageal reflux disease)   . Hypothyroidism   . Joint pain   . PTSD (post-traumatic stress disorder)     Past Surgical History:  Procedure Laterality Date  . BIOPSY SHOULDER     Left    Family History  Problem Relation Age of Onset  . Hypertension Mother   . Diabetes Mother   . Arthritis Mother   . Depression Mother   . Heart attack Father   . Hypertension Father     Social History   Socioeconomic History  . Marital status: Married    Spouse name: Not on file  . Number of children: 4  . Years of education: 62  . Highest education level: Not on file  Occupational History  . Occupation: Retired  Scientific laboratory technician  . Financial resource strain: Not on file  . Food insecurity:    Worry: Not on file    Inability: Not on file  . Transportation needs:  Medical: Not on file    Non-medical: Not on file  Tobacco Use  . Smoking status: Never Smoker  . Smokeless tobacco: Never Used  Substance and Sexual Activity  . Alcohol use: No  . Drug use: No  . Sexual activity: Not on file  Lifestyle  . Physical activity:    Days per week: Not on file    Minutes per session: Not on file  . Stress: Not on file  Relationships  . Social connections:    Talks on phone: Not on file    Gets together: Not on file    Attends religious service: Not on file    Active member of club or organization: Not on file     Attends meetings of clubs or organizations: Not on file    Relationship status: Not on file  . Intimate partner violence:    Fear of current or ex partner: Not on file    Emotionally abused: Not on file    Physically abused: Not on file    Forced sexual activity: Not on file  Other Topics Concern  . Not on file  Social History Narrative   Lives at home with his wife and son.   Right-handed.   2 cups caffeine per day.     Current Outpatient Medications:  .  acarbose (PRECOSE) 100 MG tablet, Take 100 mg by mouth 3 (three) times daily with meals., Disp: , Rfl:  .  calcium carbonate (OS-CAL) 600 MG TABS tablet, Take 600 mg by mouth 2 (two) times daily with a meal., Disp: , Rfl:  .  Crisaborole (EUCRISA) 2 % OINT, Apply 1 application topically 2 (two) times daily., Disp: 100 g, Rfl: 12 .  empagliflozin (JARDIANCE) 25 MG TABS tablet, Take by mouth., Disp: , Rfl:  .  folic acid (FOLVITE) 1 MG tablet, Take 1 mg by mouth daily., Disp: , Rfl:  .  gabapentin (NEURONTIN) 100 MG capsule, Take 100 mg by mouth 3 (three) times daily. TID at bedtime, Disp: , Rfl:  .  glipiZIDE (GLUCOTROL XL) 10 MG 24 hr tablet, Take 10 mg by mouth 2 (two) times daily. , Disp: , Rfl:  .  hydrOXYzine (ATARAX/VISTARIL) 25 MG tablet, Take 25 mg by mouth 3 (three) times daily as needed., Disp: , Rfl:  .  insulin glargine (LANTUS) 100 UNIT/ML injection, Inject 0.46-0.56 mLs (46-56 Units total) into the skin at bedtime. For now take 30 units twice a day and then increase to your usual dose as your oral intake improves. Increase both doses by 2units every day. Check your blood sugars at home three times daily. Call your doctor if sugar level is more than 300 or less than 80., Disp: 10 mL, Rfl: 11 .  Levothyroxine Sodium 88 MCG CAPS, Take by mouth., Disp: , Rfl:  .  losartan (COZAAR) 50 MG tablet, Take 100 mg by mouth daily. , Disp: , Rfl:  .  methotrexate (RHEUMATREX) 5 MG tablet, Take 2 tablets (10 mg total) by mouth once a  week. Caution: Chemotherapy. Protect from light. Take on fridays per patient.  OKAY TO RESUME FROM 10/08/14. (Patient taking differently: Take 4 mg by mouth once a week. Caution: Chemotherapy. Protect from light. Take on fridays per patient. 4 Tabs once a week  OKAY TO RESUME FROM 10/08/14.), Disp: 4 tablet, Rfl: 0 .  pantoprazole (PROTONIX) 40 MG tablet, Take 1 tablet (40 mg total) by mouth daily., Disp: 90 tablet, Rfl: 2 .  polyvinyl alcohol (ARTIFICIAL TEARS)  1.4 % ophthalmic solution, 1 drop as needed for dry eyes., Disp: , Rfl:  .  predniSONE (DELTASONE) 5 MG tablet, Take 6 mg by mouth daily with breakfast. , Disp: , Rfl:  .  tamsulosin (FLOMAX) 0.4 MG CAPS capsule, Take 0.4 mg by mouth as needed., Disp: , Rfl:  .  Triamcinolone Acetonide (TRIAMCINOLONE 0.1 % CREAM : EUCERIN) CREA, Apply 1 application topically 2 (two) times daily., Disp: 454 each, Rfl: 12 .  triamcinolone cream (KENALOG) 0.1 %, Use 1 application twice daily as needed to red itchy areas below the face, Disp: 453.6 g, Rfl: 0  EXAM:  VITALS per patient if applicable: BP 163/84 Comment: BP Cuff @ Hm  Pulse 90   Temp 97.7 F (36.5 C) (Oral) Comment: taken @ hm  Ht 5\' 7"  (1.702 m)   Wt 213 lb (96.6 kg) Comment: pt wt @ hm w/shoes  BMI 33.36 kg/m   GENERAL: alert, oriented, appears well and in no acute distress  HEENT: atraumatic, conjunttiva clear, no obvious abnormalities on inspection of external nose and ears  NECK: normal movements of the head and neck  LUNGS: on inspection no signs of respiratory distress, breathing rate appears normal, no obvious gross SOB, gasping or wheezing  CV: no obvious cyanosis  MS: moves all visible extremities without noticeable abnormality  PSYCH/NEURO: pleasant and cooperative, no obvious depression or anxiety, speech and thought processing grossly intact  ASSESSMENT AND PLAN:  Discussed the following assessment and plan:  HTN (hypertension) -Stable with losartan, recommend  continuation in addition to low salt diet.   GERD (gastroesophageal reflux disease) -Stable with protonix, renewed.   Hypothyroidism -Followed and managed by endocrinology.  Previous TSH in 03/2018 was normal.   Hypogonadism in male -[facility].  May want me to look in to taking this over if unable to get response from them.   DM2 (diabetes mellitus, type 2) -Due for f/u with endocrinology, already scheduled.  -Continue current medications.   Myositis -Stable with current medications   Itching -Hydroxyzine renewed.  -He will continue triamcinolone as needed as well.        I discussed the assessment and treatment plan with the patient. The patient was provided an opportunity to ask questions and all were answered. The patient agreed with the plan and demonstrated an understanding of the instructions.   The patient was advised to call back or seek an in-person evaluation if the symptoms worsen or if the condition fails to improve as anticipated.     [name], DO

## 2018-10-01 NOTE — Assessment & Plan Note (Signed)
-  Stable with losartan, recommend continuation in addition to low salt diet.

## 2018-10-01 NOTE — Assessment & Plan Note (Signed)
-  Followed and managed by endocrinology.  Previous TSH in 03/2018 was normal.

## 2018-10-01 NOTE — Assessment & Plan Note (Signed)
-  Currently managed by [facility] however having some trouble getting rx.  May want me to look in to taking this over if unable to get response from them.

## 2018-10-01 NOTE — Assessment & Plan Note (Signed)
-  Stable with protonix, renewed.

## 2018-10-01 NOTE — Assessment & Plan Note (Signed)
-  Due for f/u with endocrinology, already scheduled.  -Continue current medications.

## 2018-11-01 ENCOUNTER — Other Ambulatory Visit: Payer: Self-pay | Admitting: Family Medicine

## 2018-11-27 DIAGNOSIS — R972 Elevated prostate specific antigen [PSA]: Secondary | ICD-10-CM | POA: Diagnosis not present

## 2018-11-27 DIAGNOSIS — E291 Testicular hypofunction: Secondary | ICD-10-CM | POA: Diagnosis not present

## 2018-11-28 DIAGNOSIS — E119 Type 2 diabetes mellitus without complications: Secondary | ICD-10-CM | POA: Diagnosis not present

## 2018-11-28 DIAGNOSIS — Z794 Long term (current) use of insulin: Secondary | ICD-10-CM | POA: Diagnosis not present

## 2018-11-28 DIAGNOSIS — H34832 Tributary (branch) retinal vein occlusion, left eye, with macular edema: Secondary | ICD-10-CM | POA: Diagnosis not present

## 2018-11-28 DIAGNOSIS — H35372 Puckering of macula, left eye: Secondary | ICD-10-CM | POA: Diagnosis not present

## 2018-11-28 DIAGNOSIS — H2513 Age-related nuclear cataract, bilateral: Secondary | ICD-10-CM | POA: Diagnosis not present

## 2018-12-04 DIAGNOSIS — Z6832 Body mass index (BMI) 32.0-32.9, adult: Secondary | ICD-10-CM | POA: Diagnosis not present

## 2018-12-04 DIAGNOSIS — I10 Essential (primary) hypertension: Secondary | ICD-10-CM | POA: Diagnosis not present

## 2018-12-04 DIAGNOSIS — E669 Obesity, unspecified: Secondary | ICD-10-CM | POA: Diagnosis not present

## 2018-12-04 DIAGNOSIS — E785 Hyperlipidemia, unspecified: Secondary | ICD-10-CM | POA: Diagnosis not present

## 2018-12-04 DIAGNOSIS — E1142 Type 2 diabetes mellitus with diabetic polyneuropathy: Secondary | ICD-10-CM | POA: Diagnosis not present

## 2018-12-04 DIAGNOSIS — Z794 Long term (current) use of insulin: Secondary | ICD-10-CM | POA: Diagnosis not present

## 2018-12-04 LAB — HEMOGLOBIN A1C: Hemoglobin A1C: 9.9

## 2018-12-09 ENCOUNTER — Encounter: Payer: Self-pay | Admitting: Family Medicine

## 2018-12-09 NOTE — Telephone Encounter (Signed)
Yes, please set up VV to discuss.  Thanks!

## 2018-12-12 ENCOUNTER — Encounter: Payer: Self-pay | Admitting: Family Medicine

## 2018-12-12 ENCOUNTER — Telehealth (INDEPENDENT_AMBULATORY_CARE_PROVIDER_SITE_OTHER): Payer: Medicare Other | Admitting: Family Medicine

## 2018-12-12 VITALS — BP 141/74 | HR 84 | Temp 98.0°F | Wt 215.9 lb

## 2018-12-12 DIAGNOSIS — Z794 Long term (current) use of insulin: Secondary | ICD-10-CM

## 2018-12-12 DIAGNOSIS — Z79899 Other long term (current) drug therapy: Secondary | ICD-10-CM

## 2018-12-12 DIAGNOSIS — E039 Hypothyroidism, unspecified: Secondary | ICD-10-CM

## 2018-12-12 DIAGNOSIS — R5383 Other fatigue: Secondary | ICD-10-CM | POA: Insufficient documentation

## 2018-12-12 DIAGNOSIS — E1165 Type 2 diabetes mellitus with hyperglycemia: Secondary | ICD-10-CM

## 2018-12-12 DIAGNOSIS — R5382 Chronic fatigue, unspecified: Secondary | ICD-10-CM | POA: Diagnosis not present

## 2018-12-12 NOTE — Progress Notes (Signed)
[name] - 68 y.o. male MRN [id number]  Date of birth: 10-09-1950   This visit type was conducted due to national recommendations for restrictions regarding the COVID-19 Pandemic (e.g. social distancing).  This format is felt to be most appropriate for this patient at this time.  All issues noted in this document were discussed and addressed.  No physical exam was performed (except for noted visual exam findings with Video Visits).  I discussed the limitations of evaluation and management by telemedicine and the availability of in person appointments. The patient expressed understanding and agreed to proceed.  I connected with@ on 12/12/18 at 11:30 AM EDT by a video enabled telemedicine application and verified that I am speaking with the correct person using two identifiers.   Patient Location: home [place] [address]   Provider location:   Home office  Chief Complaint  Patient presents with  . Fatigue    Pt agrees to virtual visit.  Pt states that he can sleep all the time. He sleeps at night and he can still sleep at any time.  He wants to go to sleep all day long. He has had this for years and it has gotten much worse. If he stays busy he can stay awake. (per care everywhere his A1C was 9.9 abstracted)    HPI  [name] is a 68 y.o. male who presents via audio/video conferencing for a telehealth visit today.  He is being seen today for fatigue.  He has a history of T2DM, HTN, chronic pruritis, hypothyroidism, hypogonadism, and myositis.  He reports fatigue is chronic and has been going on for a few years.  He states that if he up and doing things he feels fine but as soon as he sits down he falls asleep.  He feels like he sleeps ok at night.  He is not sure if he snores.  His diabetes has not been well controlled with recent A1c at endocrinology at 9.9%.  His lantus was increased from 42 to 62 units at night.  He denies symptoms of hypoglycemia.  Previous testosterone  and TSH levels have been normal.  He does have rx for vistaril for pruritis but tried completely stopping this for a few weeks without improvement.      ROS:  A comprehensive ROS was completed and negative except as noted per HPI  Past Medical History:  Diagnosis Date  . Autoimmune disease ([place])   . Diabetes mellitus without complication ([place])   . GERD (gastroesophageal reflux disease)   . Hypothyroidism   . Joint pain   . PTSD (post-traumatic stress disorder)     Past Surgical History:  Procedure Laterality Date  . BIOPSY SHOULDER     Left    Family History  Problem Relation Age of Onset  . Hypertension Mother   . Diabetes Mother   . Arthritis Mother   . Depression Mother   . Heart attack Father   . Hypertension Father     Social History   Socioeconomic History  . Marital status: Married    Spouse name: Not on file  . Number of children: 4  . Years of education: 35  . Highest education level: Not on file  Occupational History  . Occupation: Retired  Scientific laboratory technician  . Financial resource strain: Not on file  . Food insecurity    Worry: Not on file    Inability: Not on file  . Transportation needs    Medical: Not  on file    Non-medical: Not on file  Tobacco Use  . Smoking status: Never Smoker  . Smokeless tobacco: Never Used  Substance and Sexual Activity  . Alcohol use: No  . Drug use: No  . Sexual activity: Not on file  Lifestyle  . Physical activity    Days per week: Not on file    Minutes per session: Not on file  . Stress: Not on file  Relationships  . Social Herbalist on phone: Not on file    Gets together: Not on file    Attends religious service: Not on file    Active member of club or organization: Not on file    Attends meetings of clubs or organizations: Not on file    Relationship status: Not on file  . Intimate partner violence    Fear of current or ex partner: Not on file    Emotionally abused: Not on file    Physically  abused: Not on file    Forced sexual activity: Not on file  Other Topics Concern  . Not on file  Social History Narrative   Lives at home with his wife and son.   Right-handed.   2 cups caffeine per day.     Current Outpatient Medications:  .  acarbose (PRECOSE) 100 MG tablet, Take 100 mg by mouth 3 (three) times daily with meals., Disp: , Rfl:  .  calcium carbonate (OS-CAL) 600 MG TABS tablet, Take 600 mg by mouth 2 (two) times daily with a meal., Disp: , Rfl:  .  empagliflozin (JARDIANCE) 25 MG TABS tablet, Take by mouth., Disp: , Rfl:  .  folic acid (FOLVITE) 1 MG tablet, Take 1 mg by mouth daily., Disp: , Rfl:  .  gabapentin (NEURONTIN) 100 MG capsule, Take 100 mg by mouth 3 (three) times daily. TID at bedtime, Disp: , Rfl:  .  glipiZIDE (GLUCOTROL XL) 10 MG 24 hr tablet, Take 10 mg by mouth 2 (two) times daily. , Disp: , Rfl:  .  hydrOXYzine (ATARAX/VISTARIL) 25 MG tablet, TAKE 1 TABLET BY MOUTH THREE TIMES A DAY AS NEEDED, Disp: 270 tablet, Rfl: 1 .  insulin glargine (LANTUS) 100 UNIT/ML injection, Inject 0.46-0.56 mLs (46-56 Units total) into the skin at bedtime. For now take 30 units twice a day and then increase to your usual dose as your oral intake improves. Increase both doses by 2units every day. Check your blood sugars at home three times daily. Call your doctor if sugar level is more than 300 or less than 80., Disp: 10 mL, Rfl: 11 .  Levothyroxine Sodium 88 MCG CAPS, Take by mouth., Disp: , Rfl:  .  losartan (COZAAR) 50 MG tablet, Take 100 mg by mouth daily. , Disp: , Rfl:  .  methotrexate (RHEUMATREX) 5 MG tablet, Take 2 tablets (10 mg total) by mouth once a week. Caution: Chemotherapy. Protect from light. Take on fridays per patient.  OKAY TO RESUME FROM 10/08/14. (Patient taking differently: Take 4 mg by mouth once a week. Caution: Chemotherapy. Protect from light. Take on fridays per patient. 4 Tabs once a week  OKAY TO RESUME FROM 10/08/14.), Disp: 4 tablet, Rfl: 0 .   pantoprazole (PROTONIX) 40 MG tablet, Take 1 tablet (40 mg total) by mouth daily., Disp: 90 tablet, Rfl: 2 .  polyvinyl alcohol (ARTIFICIAL TEARS) 1.4 % ophthalmic solution, 1 drop as needed for dry eyes., Disp: , Rfl:  .  predniSONE (DELTASONE) 5 MG  tablet, Take 6 mg by mouth daily with breakfast. , Disp: , Rfl:  .  testosterone (ANDROGEL) 50 MG/5GM (1%) GEL, Place 5 g onto the skin daily., Disp: , Rfl:  .  triamcinolone cream (KENALOG) 0.1 %, Use 1 application twice daily as needed to red itchy areas below the face, Disp: 453.6 g, Rfl: 0  EXAM:  VITALS per patient if applicable: BP (!) 981/19   Pulse 84   Temp 98 F (36.7 C) (Oral)   Wt 215 lb 14.4 oz (97.9 kg)   BMI 33.81 kg/m   GENERAL: alert, oriented, appears well and in no acute distress  HEENT: atraumatic, conjunttiva clear, no obvious abnormalities on inspection of external nose and ears  NECK: normal movements of the head and neck  LUNGS: on inspection no signs of respiratory distress, breathing rate appears normal, no obvious gross SOB, gasping or wheezing  CV: no obvious cyanosis  MS: moves all visible extremities without noticeable abnormality  PSYCH/NEURO: pleasant and cooperative, no obvious depression or anxiety, speech and thought processing grossly intact  ASSESSMENT AND PLAN:  Discussed the following assessment and plan:  Chronic fatigue -Has many potential causes including history of anemia, uncontrolled diabetes, hypogonadism and hypothyroidism.  -He will continue to work on getting blood sugars under control with endocrinologist.  -Will update labs including cbc w/ diff, testosterone and TSH.   -Referral placed for sleep study for eval of OSA.         I discussed the assessment and treatment plan with the patient. The patient was provided an opportunity to ask questions and all were answered. The patient agreed with the plan and demonstrated an understanding of the instructions.   The patient was  advised to call back or seek an in-person evaluation if the symptoms worsen or if the condition fails to improve as anticipated.    [name], DO

## 2018-12-12 NOTE — Progress Notes (Signed)
WF/thx dmf 

## 2018-12-12 NOTE — Assessment & Plan Note (Signed)
-  Has many potential causes including history of anemia, uncontrolled diabetes, hypogonadism and hypothyroidism.  -He will continue to work on getting blood sugars under control with endocrinologist.  -Will update labs including cbc w/ diff, testosterone and TSH.   -Referral placed for sleep study for eval of OSA.

## 2019-01-21 ENCOUNTER — Encounter: Payer: Self-pay | Admitting: Family Medicine

## 2019-01-21 ENCOUNTER — Other Ambulatory Visit: Payer: Self-pay | Admitting: Family Medicine

## 2019-01-21 MED ORDER — HYDROXYZINE HCL 25 MG PO TABS: 25.0000 mg | ORAL_TABLET | Freq: Three times a day (TID) | ORAL | 1 refills | Status: DC | PRN

## 2019-02-13 DIAGNOSIS — N5201 Erectile dysfunction due to arterial insufficiency: Secondary | ICD-10-CM | POA: Diagnosis not present

## 2019-02-13 DIAGNOSIS — E291 Testicular hypofunction: Secondary | ICD-10-CM | POA: Diagnosis not present

## 2019-02-27 ENCOUNTER — Encounter: Payer: Self-pay | Admitting: Family Medicine

## 2019-03-03 ENCOUNTER — Encounter: Payer: Self-pay | Admitting: Family Medicine

## 2019-03-03 NOTE — Telephone Encounter (Signed)
I would recommend that he speak with his [facility] provider about change to tramadol first.

## 2019-04-06 DIAGNOSIS — E785 Hyperlipidemia, unspecified: Secondary | ICD-10-CM | POA: Diagnosis not present

## 2019-04-06 DIAGNOSIS — E1142 Type 2 diabetes mellitus with diabetic polyneuropathy: Secondary | ICD-10-CM | POA: Diagnosis not present

## 2019-04-06 DIAGNOSIS — I10 Essential (primary) hypertension: Secondary | ICD-10-CM | POA: Diagnosis not present

## 2019-04-06 DIAGNOSIS — Z794 Long term (current) use of insulin: Secondary | ICD-10-CM | POA: Diagnosis not present

## 2019-05-04 DIAGNOSIS — N5201 Erectile dysfunction due to arterial insufficiency: Secondary | ICD-10-CM | POA: Diagnosis not present

## 2019-05-04 DIAGNOSIS — R351 Nocturia: Secondary | ICD-10-CM | POA: Diagnosis not present

## 2019-06-15 DIAGNOSIS — E1151 Type 2 diabetes mellitus with diabetic peripheral angiopathy without gangrene: Secondary | ICD-10-CM | POA: Diagnosis not present

## 2019-06-15 DIAGNOSIS — L84 Corns and callosities: Secondary | ICD-10-CM | POA: Diagnosis not present

## 2019-06-15 DIAGNOSIS — I739 Peripheral vascular disease, unspecified: Secondary | ICD-10-CM | POA: Diagnosis not present

## 2019-06-15 DIAGNOSIS — L603 Nail dystrophy: Secondary | ICD-10-CM | POA: Diagnosis not present

## 2019-07-02 DIAGNOSIS — H2513 Age-related nuclear cataract, bilateral: Secondary | ICD-10-CM | POA: Diagnosis not present

## 2019-07-02 DIAGNOSIS — H40003 Preglaucoma, unspecified, bilateral: Secondary | ICD-10-CM | POA: Diagnosis not present

## 2019-07-10 ENCOUNTER — Encounter: Payer: Self-pay | Admitting: Allergy

## 2019-07-16 ENCOUNTER — Other Ambulatory Visit: Payer: Self-pay | Admitting: Family Medicine

## 2019-07-16 NOTE — Telephone Encounter (Signed)
Patient returning your call. Please call patient.

## 2019-07-16 NOTE — Telephone Encounter (Signed)
Left vm for the pt to call back, last ov was 11/2018 with Dr. Zigmund Daniel.

## 2019-07-22 NOTE — Telephone Encounter (Signed)
LVM for the pt to call back.

## 2019-08-27 DIAGNOSIS — E1165 Type 2 diabetes mellitus with hyperglycemia: Secondary | ICD-10-CM | POA: Diagnosis not present

## 2019-08-27 DIAGNOSIS — I10 Essential (primary) hypertension: Secondary | ICD-10-CM | POA: Diagnosis not present

## 2019-08-27 DIAGNOSIS — Z794 Long term (current) use of insulin: Secondary | ICD-10-CM | POA: Diagnosis not present

## 2019-08-27 DIAGNOSIS — E1142 Type 2 diabetes mellitus with diabetic polyneuropathy: Secondary | ICD-10-CM | POA: Diagnosis not present

## 2019-08-27 LAB — BASIC METABOLIC PANEL: Glucose: 209

## 2019-08-27 LAB — HEMOGLOBIN A1C: Hemoglobin A1C: 9.2

## 2019-09-16 DIAGNOSIS — I739 Peripheral vascular disease, unspecified: Secondary | ICD-10-CM | POA: Diagnosis not present

## 2019-09-16 DIAGNOSIS — E1151 Type 2 diabetes mellitus with diabetic peripheral angiopathy without gangrene: Secondary | ICD-10-CM | POA: Diagnosis not present

## 2019-09-16 DIAGNOSIS — L84 Corns and callosities: Secondary | ICD-10-CM | POA: Diagnosis not present

## 2019-09-16 DIAGNOSIS — L603 Nail dystrophy: Secondary | ICD-10-CM | POA: Diagnosis not present

## 2019-09-21 ENCOUNTER — Ambulatory Visit (INDEPENDENT_AMBULATORY_CARE_PROVIDER_SITE_OTHER): Payer: Medicare Other | Admitting: Family Medicine

## 2019-09-21 ENCOUNTER — Other Ambulatory Visit: Payer: Self-pay

## 2019-09-21 ENCOUNTER — Encounter: Payer: Self-pay | Admitting: Family Medicine

## 2019-09-21 VITALS — BP 140/72 | HR 93 | Temp 97.1°F | Ht 68.11 in | Wt 200.3 lb

## 2019-09-21 DIAGNOSIS — R5383 Other fatigue: Secondary | ICD-10-CM | POA: Diagnosis not present

## 2019-09-21 DIAGNOSIS — M609 Myositis, unspecified: Secondary | ICD-10-CM

## 2019-09-21 DIAGNOSIS — L299 Pruritus, unspecified: Secondary | ICD-10-CM | POA: Diagnosis not present

## 2019-09-21 DIAGNOSIS — E039 Hypothyroidism, unspecified: Secondary | ICD-10-CM

## 2019-09-21 DIAGNOSIS — M2550 Pain in unspecified joint: Secondary | ICD-10-CM

## 2019-09-21 DIAGNOSIS — R209 Unspecified disturbances of skin sensation: Secondary | ICD-10-CM

## 2019-09-21 DIAGNOSIS — I10 Essential (primary) hypertension: Secondary | ICD-10-CM

## 2019-09-21 DIAGNOSIS — R351 Nocturia: Secondary | ICD-10-CM | POA: Diagnosis not present

## 2019-09-21 DIAGNOSIS — Z79899 Other long term (current) drug therapy: Secondary | ICD-10-CM

## 2019-09-21 DIAGNOSIS — E1165 Type 2 diabetes mellitus with hyperglycemia: Secondary | ICD-10-CM

## 2019-09-21 DIAGNOSIS — E291 Testicular hypofunction: Secondary | ICD-10-CM | POA: Diagnosis not present

## 2019-09-21 DIAGNOSIS — Z794 Long term (current) use of insulin: Secondary | ICD-10-CM

## 2019-09-21 DIAGNOSIS — G629 Polyneuropathy, unspecified: Secondary | ICD-10-CM

## 2019-09-21 MED ORDER — GABAPENTIN 300 MG PO CAPS: 300.0000 mg | ORAL_CAPSULE | Freq: Three times a day (TID) | ORAL | 1 refills | Status: DC

## 2019-09-21 MED ORDER — PANTOPRAZOLE SODIUM 40 MG PO TBEC: 40.0000 mg | DELAYED_RELEASE_TABLET | Freq: Every day | ORAL | 2 refills | Status: DC

## 2019-09-21 MED ORDER — HYDROXYZINE HCL 25 MG PO TABS: 25.0000 mg | ORAL_TABLET | Freq: Three times a day (TID) | ORAL | 0 refills | Status: DC | PRN

## 2019-09-21 NOTE — Patient Instructions (Signed)
Great to see you today! Have labs completed, we'll be in touch with results Please have records from [place] sent to me after you have nerve conduction completed.  I would recommend increasing gabapentin to 300mg , three times per day.

## 2019-09-22 ENCOUNTER — Encounter: Payer: Self-pay | Admitting: Family Medicine

## 2019-09-22 DIAGNOSIS — E039 Hypothyroidism, unspecified: Secondary | ICD-10-CM | POA: Diagnosis not present

## 2019-09-22 DIAGNOSIS — R209 Unspecified disturbances of skin sensation: Secondary | ICD-10-CM | POA: Diagnosis not present

## 2019-09-22 DIAGNOSIS — E1165 Type 2 diabetes mellitus with hyperglycemia: Secondary | ICD-10-CM | POA: Diagnosis not present

## 2019-09-22 DIAGNOSIS — Z794 Long term (current) use of insulin: Secondary | ICD-10-CM | POA: Diagnosis not present

## 2019-09-22 DIAGNOSIS — Z79899 Other long term (current) drug therapy: Secondary | ICD-10-CM | POA: Diagnosis not present

## 2019-09-22 DIAGNOSIS — R351 Nocturia: Secondary | ICD-10-CM | POA: Diagnosis not present

## 2019-09-22 DIAGNOSIS — I10 Essential (primary) hypertension: Secondary | ICD-10-CM | POA: Diagnosis not present

## 2019-09-22 DIAGNOSIS — R5383 Other fatigue: Secondary | ICD-10-CM | POA: Diagnosis not present

## 2019-09-22 DIAGNOSIS — E291 Testicular hypofunction: Secondary | ICD-10-CM | POA: Diagnosis not present

## 2019-09-22 DIAGNOSIS — G629 Polyneuropathy, unspecified: Secondary | ICD-10-CM | POA: Diagnosis not present

## 2019-09-22 DIAGNOSIS — M2550 Pain in unspecified joint: Secondary | ICD-10-CM | POA: Diagnosis not present

## 2019-09-22 LAB — COMPLETE METABOLIC PANEL WITH GFR
AG Ratio: 2.5 (calc) (ref 1.0–2.5)
ALT: 32 U/L (ref 9–46)
AST: 26 U/L (ref 10–35)
Albumin: 4.5 g/dL (ref 3.6–5.1)
Alkaline phosphatase (APISO): 66 U/L (ref 35–144)
BUN/Creatinine Ratio: 18 (calc) (ref 6–22)
BUN: 24 mg/dL (ref 7–25)
CO2: 29 mmol/L (ref 20–32)
Calcium: 9.4 mg/dL (ref 8.6–10.3)
Chloride: 104 mmol/L (ref 98–110)
Creat: 1.33 mg/dL — ABNORMAL HIGH (ref 0.70–1.18)
GFR, Est African American: 62 mL/min/{1.73_m2} (ref >=60)
GFR, Est Non African American: 54 mL/min/{1.73_m2} — ABNORMAL LOW (ref >=60)
Globulin: 1.8 g/dL (calc) — ABNORMAL LOW (ref 1.9–3.7)
Glucose, Bld: 149 mg/dL — ABNORMAL HIGH (ref 65–99)
Potassium: 4.6 mmol/L (ref 3.5–5.3)
Sodium: 139 mmol/L (ref 135–146)
Total Bilirubin: 0.9 mg/dL (ref 0.2–1.2)
Total Protein: 6.3 g/dL (ref 6.1–8.1)

## 2019-09-22 LAB — TESTOSTERONE: Testosterone: 115 ng/dL — ABNORMAL LOW (ref 250–827)

## 2019-09-22 LAB — PSA: PSA: 0.2 ng/mL (ref <=4.0)

## 2019-09-22 LAB — LIPID PANEL
Cholesterol: 200 mg/dL — ABNORMAL HIGH (ref <200)
HDL: 40 mg/dL (ref >=40)
LDL Cholesterol (Calc): 131 mg/dL (calc) — ABNORMAL HIGH
Non-HDL Cholesterol (Calc): 160 mg/dL (calc) — ABNORMAL HIGH (ref <130)
Total CHOL/HDL Ratio: 5 (calc) — ABNORMAL HIGH (ref <5.0)
Triglycerides: 159 mg/dL — ABNORMAL HIGH (ref <150)

## 2019-09-22 LAB — CBC
HCT: 43.4 % (ref 38.5–50.0)
Hemoglobin: 14.4 g/dL (ref 13.2–17.1)
MCH: 30.2 pg (ref 27.0–33.0)
MCHC: 33.2 g/dL (ref 32.0–36.0)
MCV: 91 fL (ref 80.0–100.0)
MPV: 11.4 fL (ref 7.5–12.5)
Platelets: 139 10*3/uL — ABNORMAL LOW (ref 140–400)
RBC: 4.77 10*6/uL (ref 4.20–5.80)
RDW: 13.5 % (ref 11.0–15.0)
WBC: 6.8 10*3/uL (ref 3.8–10.8)

## 2019-09-22 LAB — URIC ACID: Uric Acid, Serum: 5.7 mg/dL (ref 4.0–8.0)

## 2019-09-22 LAB — B12 AND FOLATE PANEL
Folate: >24 ng/mL
Vitamin B-12: 369 pg/mL (ref 200–1100)

## 2019-09-22 LAB — TSH: TSH: 1.26 mIU/L (ref 0.40–4.50)

## 2019-09-22 NOTE — Assessment & Plan Note (Signed)
Stable with vistaril as needed.

## 2019-09-22 NOTE — Progress Notes (Signed)
[name] - 69 y.o. male MRN [id number]  Date of birth: 08/11/1950  Subjective Chief Complaint  Patient presents with  . Leg Pain    HPI [name] is a 69 y.o. male with history of HTN, T2DM, hypothyroidism, low testosterone, polymyositis and BPH here today for follow up visit.  He is also followed through the [place] and receives many of his medications through them.    -HTN:  Current management with losartan.  He is doing well with this and denies side effects.  He has not had chest pain, shortness of breath, palpitations, headache or vision changes.   -T2DM:  Followed by endocrinology through [facility].  Current management with acarbose, semaglutide, glipizide and lantus.  Most recent a1c of  9.2% on 08/27/19.    -Polymyositis:  Followed by rheumatology through the [facility].  He continues on prednisone and methotrexate.  Reports some increased pain in upper thighs with some increased fatigue.  Also having some burning pain in extremities.  He has gabapentin 100mg  TID currently.    ROS:  A comprehensive ROS was completed and negative except as noted per HPI   Allergies  Allergen Reactions  . Benazepril Cough  . Hydrocodone   . Metformin Other (See Comments)  . Shellfish Allergy   . Tramadol Hcl Other (See Comments)  . Ultram [Tramadol]     "shakes"    Past Medical History:  Diagnosis Date  . Autoimmune disease ([place])   . Diabetes mellitus without complication ([place])   . GERD (gastroesophageal reflux disease)   . Hypothyroidism   . Joint pain   . PTSD (post-traumatic stress disorder)     Past Surgical History:  Procedure Laterality Date  . BIOPSY SHOULDER     Left    Social History   Socioeconomic History  . Marital status: Married    Spouse name: Not on file  . Number of children: 4  . Years of education: 4  . Highest education level: Not on file  Occupational History  . Occupation: Retired  Tobacco Use  . Smoking status: Never Smoker  . Smokeless tobacco: Never  Used  Substance and Sexual Activity  . Alcohol use: No  . Drug use: No  . Sexual activity: Not on file  Other Topics Concern  . Not on file  Social History Narrative   Lives at home with his wife and son.   Right-handed.   2 cups caffeine per day.   Social Determinants of Health   Financial Resource Strain:   . Difficulty of Paying Living Expenses:   Food Insecurity:   . Worried About Charity fundraiser in the Last Year:   . Arboriculturist in the Last Year:   Transportation Needs:   . Film/video editor (Medical):   Marland Kitchen Lack of Transportation (Non-Medical):   Physical Activity:   . Days of Exercise per Week:   . Minutes of Exercise per Session:   Stress:   . Feeling of Stress :   Social Connections:   . Frequency of Communication with Friends and Family:   . Frequency of Social Gatherings with Friends and Family:   . Attends Religious Services:   . Active Member of Clubs or Organizations:   . Attends Archivist Meetings:   Marland Kitchen Marital Status:     Family History  Problem Relation Age of Onset  . Hypertension Mother   . Diabetes Mother   . Arthritis Mother   . Depression Mother   .  Heart attack Father   . Hypertension Father     Health Maintenance  Topic Date Due  . FOOT EXAM  Never done  . OPHTHALMOLOGY EXAM  Never done  . COVID-19 Vaccine (1) Never done  . COLONOSCOPY  Never done  . PNA vac Low Risk Adult (2 of 2 - PPSV23) 10/15/2015  . INFLUENZA VACCINE  11/29/2019  . HEMOGLOBIN A1C  02/26/2020  . TETANUS/TDAP  08/19/2021  . Hepatitis C Screening  Completed     ----------------------------------------------------------------------------------------------------------------------------------------------------------------------------------------------------------------- Physical Exam BP 140/72 (BP Location: Left Arm, Patient Position: Sitting, Cuff Size: Large)   Pulse 93   Temp (!) 97.1 F (36.2 C) (Temporal)   Ht 5' 8.11" (1.73 m)   Wt  200 lb 4.8 oz (90.9 kg)   SpO2 96%   BMI 30.36 kg/m   Physical Exam Constitutional:      Appearance: Normal appearance.  HENT:     Head: Normocephalic and atraumatic.  Eyes:     General: No scleral icterus. Cardiovascular:     Rate and Rhythm: Normal rate and regular rhythm.  Pulmonary:     Effort: Pulmonary effort is normal.     Breath sounds: Normal breath sounds.  Musculoskeletal:     Cervical back: Neck supple.  Skin:    General: Skin is warm and dry.  Neurological:     General: No focal deficit present.     Mental Status: He is alert.  Psychiatric:        Mood and Affect: Mood normal.        Behavior: Behavior normal.     ------------------------------------------------------------------------------------------------------------------------------------------------------------------------------------------------------------------- Assessment and Plan  HTN (hypertension) Blood pressure is at goal at for age and co-morbidities.  I recommend he continue losartan.  In addition they were instructed to follow a low sodium diet with regular exercise to help to maintain adequate control of blood pressure.    DM2 (diabetes mellitus, type 2) Managed by endocrinology.   Exacerbated by chronic steroid use.   Hypothyroidism Some increased fatigue, update TSH  Hypogonadism in male Update testosterone, cbc and psa  Myositis Some increased pain possibly related to flare or myositis.  Could very well be related to neuropathy as well.  He will discuss with rheumatology.  I am going to increase his gabapentin to 300mg  TID, check B12, uric acid and folate.   Itching Stable with vistaril as needed.    Meds ordered this encounter  Medications  . hydrOXYzine (ATARAX/VISTARIL) 25 MG tablet    Sig: Take 1 tablet (25 mg total) by mouth 3 (three) times daily as needed. For itching    Dispense:  270 tablet    Refill:  0  . pantoprazole (PROTONIX) 40 MG tablet    Sig: Take 1  tablet (40 mg total) by mouth daily.    Dispense:  90 tablet    Refill:  2  . gabapentin (NEURONTIN) 300 MG capsule    Sig: Take 1 capsule (300 mg total) by mouth 3 (three) times daily.    Dispense:  270 capsule    Refill:  1    Return in about 3 months (around 12/22/2019) for Leg pain.    This visit occurred during the SARS-CoV-2 public health emergency.  Safety protocols were in place, including screening questions prior to the visit, additional usage of staff PPE, and extensive cleaning of exam room while observing appropriate contact time as indicated for disinfecting solutions.

## 2019-09-22 NOTE — Assessment & Plan Note (Signed)
Managed by endocrinology.   Exacerbated by chronic steroid use.

## 2019-09-22 NOTE — Assessment & Plan Note (Signed)
Update testosterone, cbc and psa

## 2019-09-22 NOTE — Assessment & Plan Note (Signed)
Some increased fatigue, update TSH

## 2019-09-22 NOTE — Assessment & Plan Note (Signed)
Blood pressure is at goal at for age and co-morbidities.  I recommend he continue losartan.  In addition they were instructed to follow a low sodium diet with regular exercise to help to maintain adequate control of blood pressure.   

## 2019-09-22 NOTE — Assessment & Plan Note (Signed)
Some increased pain possibly related to flare or myositis.  Could very well be related to neuropathy as well.  He will discuss with rheumatology.  I am going to increase his gabapentin to 300mg  TID, check B12, uric acid and folate.

## 2019-09-25 DIAGNOSIS — M79606 Pain in leg, unspecified: Secondary | ICD-10-CM | POA: Diagnosis not present

## 2019-09-25 DIAGNOSIS — G603 Idiopathic progressive neuropathy: Secondary | ICD-10-CM | POA: Diagnosis not present

## 2019-10-06 ENCOUNTER — Other Ambulatory Visit: Payer: Self-pay | Admitting: Family Medicine

## 2019-10-06 DIAGNOSIS — Z1211 Encounter for screening for malignant neoplasm of colon: Secondary | ICD-10-CM

## 2019-10-06 MED ORDER — SIMVASTATIN 40 MG PO TABS: 40.0000 mg | ORAL_TABLET | Freq: Every day | ORAL | 3 refills | Status: DC

## 2019-10-16 DIAGNOSIS — Z419 Encounter for procedure for purposes other than remedying health state, unspecified: Secondary | ICD-10-CM | POA: Diagnosis not present

## 2019-10-16 DIAGNOSIS — L819 Disorder of pigmentation, unspecified: Secondary | ICD-10-CM | POA: Diagnosis not present

## 2019-12-22 ENCOUNTER — Ambulatory Visit: Payer: Medicare Other | Admitting: Family Medicine

## 2019-12-22 DIAGNOSIS — L84 Corns and callosities: Secondary | ICD-10-CM | POA: Diagnosis not present

## 2019-12-22 DIAGNOSIS — E1151 Type 2 diabetes mellitus with diabetic peripheral angiopathy without gangrene: Secondary | ICD-10-CM | POA: Diagnosis not present

## 2019-12-22 DIAGNOSIS — L603 Nail dystrophy: Secondary | ICD-10-CM | POA: Diagnosis not present

## 2019-12-22 DIAGNOSIS — I739 Peripheral vascular disease, unspecified: Secondary | ICD-10-CM | POA: Diagnosis not present

## 2019-12-26 ENCOUNTER — Other Ambulatory Visit: Payer: Self-pay | Admitting: Family Medicine

## 2020-01-13 ENCOUNTER — Ambulatory Visit (INDEPENDENT_AMBULATORY_CARE_PROVIDER_SITE_OTHER): Payer: Medicare Other | Admitting: Family Medicine

## 2020-01-13 ENCOUNTER — Other Ambulatory Visit: Payer: Self-pay

## 2020-01-13 ENCOUNTER — Encounter: Payer: Self-pay | Admitting: Family Medicine

## 2020-01-13 DIAGNOSIS — M25473 Effusion, unspecified ankle: Secondary | ICD-10-CM

## 2020-01-13 MED ORDER — GABAPENTIN 100 MG PO CAPS
300.0000 mg | ORAL_CAPSULE | Freq: Three times a day (TID) | ORAL | 2 refills | Status: DC
Start: 2020-01-13 — End: 2020-01-17

## 2020-01-13 NOTE — Patient Instructions (Signed)
You had uric acid level checked in May, this was normal.  Let's try decreasing gabapentin to 100mg  three times per day.  This may help with the swelling.  Sign a new release to have updated records from [place] sent over.

## 2020-01-17 DIAGNOSIS — M25473 Effusion, unspecified ankle: Secondary | ICD-10-CM | POA: Insufficient documentation

## 2020-01-17 MED ORDER — GABAPENTIN 100 MG PO CAPS
100.0000 mg | ORAL_CAPSULE | Freq: Three times a day (TID) | ORAL | 2 refills | Status: DC
Start: 2020-01-17 — End: 2021-07-27

## 2020-01-17 NOTE — Progress Notes (Signed)
[name] - 69 y.o. male MRN [id number]  Date of birth: 1950-08-29  Subjective Chief Complaint  Patient presents with  . Leg Swelling    HPI [name] is a 69 y.o. male here today with complaint of bilateral leg and ankle swelling.  He has history of HTN, T2DM, BPH, Hypothyroidism, and myositis/inflammatory arthritis.  Swelling in ankles and legs started a few weeks ago but has had mild symptoms in the past.  Ankles are painful.  He has previous normal uric acid levels in May of this year.  He is seeing rheumatology through the [facility] and has appt with neurology as well coming up.  He is taking methotrexate and prednisone daily.  He did increase his gabapentin a couple of months ago but didn't find the increase to be very helpful.  His TSH levels were normal in May as well.  He denies chest pain, shortness of breath, fatigue or palpitations.   ROS:  A comprehensive ROS was completed and negative except as noted per HPI  Allergies  Allergen Reactions  . Benazepril Cough  . Hydrocodone   . Metformin Other (See Comments)  . Shellfish Allergy     Past Medical History:  Diagnosis Date  . Autoimmune disease ([place])   . Diabetes mellitus without complication ([place])   . GERD (gastroesophageal reflux disease)   . Hypothyroidism   . Joint pain   . PTSD (post-traumatic stress disorder)     Past Surgical History:  Procedure Laterality Date  . BIOPSY SHOULDER     Left    Social History   Socioeconomic History  . Marital status: Married    Spouse name: Not on file  . Number of children: 4  . Years of education: 80  . Highest education level: Not on file  Occupational History  . Occupation: Retired  Tobacco Use  . Smoking status: Never Smoker  . Smokeless tobacco: Never Used  Substance and Sexual Activity  . Alcohol use: No  . Drug use: No  . Sexual activity: Not on file  Other Topics Concern  . Not on file  Social History Narrative   Lives at home with his wife and son.    Right-handed.   2 cups caffeine per day.   Social Determinants of Health   Financial Resource Strain:   . Difficulty of Paying Living Expenses: Not on file  Food Insecurity:   . Worried About Charity fundraiser in the Last Year: Not on file  . Ran Out of Food in the Last Year: Not on file  Transportation Needs:   . Lack of Transportation (Medical): Not on file  . Lack of Transportation (Non-Medical): Not on file  Physical Activity:   . Days of Exercise per Week: Not on file  . Minutes of Exercise per Session: Not on file  Stress:   . Feeling of Stress : Not on file  Social Connections:   . Frequency of Communication with Friends and Family: Not on file  . Frequency of Social Gatherings with Friends and Family: Not on file  . Attends Religious Services: Not on file  . Active Member of Clubs or Organizations: Not on file  . Attends Archivist Meetings: Not on file  . Marital Status: Not on file    Family History  Problem Relation Age of Onset  . Hypertension Mother   . Diabetes Mother   . Arthritis Mother   . Depression Mother   . Heart attack Father   .  Hypertension Father     Health Maintenance  Topic Date Due  . FOOT EXAM  Never done  . OPHTHALMOLOGY EXAM  Never done  . COVID-19 Vaccine (1) Never done  . COLONOSCOPY  Never done  . PNA vac Low Risk Adult (2 of 2 - PPSV23) 10/15/2015  . INFLUENZA VACCINE  11/29/2019  . HEMOGLOBIN A1C  02/26/2020  . TETANUS/TDAP  08/19/2021  . Hepatitis C Screening  Completed     ----------------------------------------------------------------------------------------------------------------------------------------------------------------------------------------------------------------- Physical Exam BP (!) 152/88 (BP Location: Left Arm, Patient Position: Sitting, Cuff Size: Large)   Pulse (!) 102   Ht 5' 8.11" (1.73 m)   Wt 210 lb (95.3 kg)   SpO2 96%   BMI 31.83 kg/m   Physical Exam Constitutional:       Appearance: Normal appearance.  HENT:     Head: Normocephalic and atraumatic.  Eyes:     General: No scleral icterus. Cardiovascular:     Rate and Rhythm: Normal rate and regular rhythm.  Pulmonary:     Effort: Pulmonary effort is normal.     Breath sounds: Normal breath sounds.  Musculoskeletal:     Cervical back: Neck supple.     Comments: Swelling and ttp around ankles with some swelling extending 1/3 way up the lower leg.     Neurological:     General: No focal deficit present.     Mental Status: He is alert.  Psychiatric:        Mood and Affect: Mood normal.        Behavior: Behavior normal.     ------------------------------------------------------------------------------------------------------------------------------------------------------------------------------------------------------------------- Assessment and Plan  Ankle swelling This is likely due to ongoing inflammatory arthropathy/myopathy Normal TSH and uric acid levels 08/2019.  He does not have symptoms suggestive of CHF.   Gabapentin may be contributing some to swelling will reduce back to 100mg  as he didn't have any benefit from increased dose.     Meds ordered this encounter  Medications  . gabapentin (NEURONTIN) 100 MG capsule    Sig: Take 3 capsules (300 mg total) by mouth 3 (three) times daily.    Dispense:  90 capsule    Refill:  2    No follow-ups on file.    This visit occurred during the SARS-CoV-2 public health emergency.  Safety protocols were in place, including screening questions prior to the visit, additional usage of staff PPE, and extensive cleaning of exam room while observing appropriate contact time as indicated for disinfecting solutions.

## 2020-01-17 NOTE — Assessment & Plan Note (Signed)
This is likely due to ongoing inflammatory arthropathy/myopathy Normal TSH and uric acid levels 08/2019.  He does not have symptoms suggestive of CHF.   Gabapentin may be contributing some to swelling will reduce back to 100mg  as he didn't have any benefit from increased dose.

## 2020-02-10 DIAGNOSIS — E1142 Type 2 diabetes mellitus with diabetic polyneuropathy: Secondary | ICD-10-CM | POA: Diagnosis not present

## 2020-02-10 DIAGNOSIS — Z794 Long term (current) use of insulin: Secondary | ICD-10-CM | POA: Diagnosis not present

## 2020-02-10 DIAGNOSIS — N529 Male erectile dysfunction, unspecified: Secondary | ICD-10-CM | POA: Diagnosis not present

## 2020-02-10 DIAGNOSIS — E1165 Type 2 diabetes mellitus with hyperglycemia: Secondary | ICD-10-CM | POA: Diagnosis not present

## 2020-02-10 DIAGNOSIS — I10 Essential (primary) hypertension: Secondary | ICD-10-CM | POA: Diagnosis not present

## 2020-02-22 ENCOUNTER — Other Ambulatory Visit: Payer: Self-pay

## 2020-02-22 ENCOUNTER — Encounter (HOSPITAL_BASED_OUTPATIENT_CLINIC_OR_DEPARTMENT_OTHER): Payer: Self-pay

## 2020-02-22 ENCOUNTER — Emergency Department (HOSPITAL_BASED_OUTPATIENT_CLINIC_OR_DEPARTMENT_OTHER)
Admission: EM | Admit: 2020-02-22 | Discharge: 2020-02-22 | Disposition: A | Discharge status: Home / Self Care | Payer: No Typology Code available for payment source | Attending: Emergency Medicine | Admitting: Emergency Medicine

## 2020-02-22 ENCOUNTER — Emergency Department (HOSPITAL_BASED_OUTPATIENT_CLINIC_OR_DEPARTMENT_OTHER): Payer: No Typology Code available for payment source

## 2020-02-22 DIAGNOSIS — I10 Essential (primary) hypertension: Secondary | ICD-10-CM | POA: Diagnosis not present

## 2020-02-22 DIAGNOSIS — E119 Type 2 diabetes mellitus without complications: Secondary | ICD-10-CM | POA: Insufficient documentation

## 2020-02-22 DIAGNOSIS — R531 Weakness: Secondary | ICD-10-CM | POA: Insufficient documentation

## 2020-02-22 DIAGNOSIS — Z79899 Other long term (current) drug therapy: Secondary | ICD-10-CM | POA: Diagnosis not present

## 2020-02-22 DIAGNOSIS — Z7984 Long term (current) use of oral hypoglycemic drugs: Secondary | ICD-10-CM | POA: Diagnosis not present

## 2020-02-22 DIAGNOSIS — R251 Tremor, unspecified: Secondary | ICD-10-CM

## 2020-02-22 DIAGNOSIS — Z794 Long term (current) use of insulin: Secondary | ICD-10-CM | POA: Diagnosis not present

## 2020-02-22 DIAGNOSIS — E039 Hypothyroidism, unspecified: Secondary | ICD-10-CM | POA: Insufficient documentation

## 2020-02-22 DIAGNOSIS — R5383 Other fatigue: Secondary | ICD-10-CM | POA: Diagnosis not present

## 2020-02-22 LAB — CBC WITH DIFFERENTIAL/PLATELET
Abs Immature Granulocytes: 0.05 10*3/uL (ref 0.00–0.07)
Basophils Absolute: 0.1 10*3/uL (ref 0.0–0.1)
Basophils Relative: 1 %
Eosinophils Absolute: 0.3 10*3/uL (ref 0.0–0.5)
Eosinophils Relative: 3 %
HCT: 48.8 % (ref 39.0–52.0)
Hemoglobin: 15.9 g/dL (ref 13.0–17.0)
Immature Granulocytes: 1 %
Lymphocytes Relative: 12 %
Lymphs Abs: 1 10*3/uL (ref 0.7–4.0)
MCH: 30.4 pg (ref 26.0–34.0)
MCHC: 32.6 g/dL (ref 30.0–36.0)
MCV: 93.3 fL (ref 80.0–100.0)
Monocytes Absolute: 0.6 10*3/uL (ref 0.1–1.0)
Monocytes Relative: 7 %
Neutro Abs: 6.3 10*3/uL (ref 1.7–7.7)
Neutrophils Relative %: 76 %
Platelets: 166 10*3/uL (ref 150–400)
RBC: 5.23 MIL/uL (ref 4.22–5.81)
RDW: 14 % (ref 11.5–15.5)
WBC: 8.2 10*3/uL (ref 4.0–10.5)
nRBC: 0 % (ref 0.0–0.2)

## 2020-02-22 LAB — BASIC METABOLIC PANEL
Anion gap: 10 (ref 5–15)
BUN: 17 mg/dL (ref 8–23)
CO2: 26 mmol/L (ref 22–32)
Calcium: 9.4 mg/dL (ref 8.9–10.3)
Chloride: 102 mmol/L (ref 98–111)
Creatinine, Ser: 1.41 mg/dL — ABNORMAL HIGH (ref 0.61–1.24)
GFR, Estimated: 53 mL/min — ABNORMAL LOW (ref >60)
Glucose, Bld: 186 mg/dL — ABNORMAL HIGH (ref 70–99)
Potassium: 3.9 mmol/L (ref 3.5–5.1)
Sodium: 138 mmol/L (ref 135–145)

## 2020-02-22 NOTE — Discharge Instructions (Signed)
Please schedule follow-up with your primary doctor, keep the appointment with the neurologist.  If you develop worsening weakness, numbness, speech changes or vision changes, or further falls, return to ER for reassessment.

## 2020-02-22 NOTE — ED Provider Notes (Signed)
[facility] EMERGENCY DEPARTMENT Provider Note   CSN: [id number] Arrival date & time: 02/22/20  1135     History Chief Complaint  Patient presents with  . Tremors    [name] is a 69 y.o. male.  Presents to ER with concern for multiple symptoms.  Reports over the past year he has developed generalized weakness, fatigue, shakes.  He has been seen by his primary doctor, rheumatology and has a neurology appointment on November 12.  Reports he has been diagnosed with "myopathy" but they are unsure what the underlying causes.  He reports that the shakes have been worse over the last few days and that he had increased falls.  Fell on Saturday, hit the back of his head.  No LOC, no nausea or vomiting.  He does not have any associated chest pain, abdominal pain, dysuria or hematuria.  No fevers.  None of the symptoms that he is experiencing today are new.  HPI     Past Medical History:  Diagnosis Date  . Autoimmune disease ([place])   . Diabetes mellitus without complication ([place])   . GERD (gastroesophageal reflux disease)   . Hypothyroidism   . Joint pain   . PTSD (post-traumatic stress disorder)     Patient Active Problem List   Diagnosis Date Noted  . Ankle swelling 01/17/2020  . Chronic fatigue 12/12/2018  . GERD (gastroesophageal reflux disease) 06/04/2018  . Hypogonadism in male 04/01/2018  . Anemia 09/30/2017  . Itching 09/30/2017  . Hypothyroidism 09/30/2017  . Low testosterone 09/30/2017  . Benign prostatic hyperplasia without lower urinary tract symptoms 09/30/2017  . Erectile dysfunction 03/19/2016  . Benign essential HTN 08/08/2015  . Branch retinal vein occlusion with macular edema of left eye 03/25/2015  . Nuclear sclerotic cataract of both eyes 03/25/2015  . Myositis 09/28/2014  . DM2 (diabetes mellitus, type 2) ([place]) 09/28/2014  . HTN (hypertension) 09/28/2014  . Abnormal glucose 10/04/2012    Past Surgical History:  Procedure Laterality Date  .  BIOPSY SHOULDER     Left       Family History  Problem Relation Age of Onset  . Hypertension Mother   . Diabetes Mother   . Arthritis Mother   . Depression Mother   . Heart attack Father   . Hypertension Father     Social History   Tobacco Use  . Smoking status: Never Smoker  . Smokeless tobacco: Never Used  Substance Use Topics  . Alcohol use: No  . Drug use: No    Home Medications Prior to Admission medications   Medication Sig Start Date End Date Taking? Authorizing Provider  acarbose (PRECOSE) 100 MG tablet Take 100 mg by mouth 3 (three) times daily with meals.    [provider]  calcium carbonate (OS-CAL) 600 MG TABS tablet Take 600 mg by mouth 2 (two) times daily with a meal.    [provider]  DULoxetine (CYMBALTA) 30 MG capsule Take 30 mg by mouth daily. 09/25/19   [provider]  empagliflozin (JARDIANCE) 25 MG TABS tablet Take 10 mg by mouth. Take 1/2 tab 08/25/18   [provider]  folic acid (FOLVITE) 1 MG tablet Take 1 mg by mouth daily.    [provider]  gabapentin (NEURONTIN) 100 MG capsule Take 1 capsule (100 mg total) by mouth 3 (three) times daily. 01/17/20   [name], DO  glipiZIDE (GLUCOTROL XL) 10 MG 24 hr tablet Take 10 mg by mouth 2 (two) times  daily.     [provider]  hydrOXYzine (ATARAX/VISTARIL) 25 MG tablet TAKE 1 TABLET (25 MG TOTAL) BY MOUTH 3 (THREE) TIMES DAILY AS NEEDED. FOR ITCHING 12/28/19   [name], DO  insulin glargine (LANTUS) 100 UNIT/ML injection Inject 0.46-0.56 mLs (46-56 Units total) into the skin at bedtime. For now take 30 units twice a day and then increase to your usual dose as your oral intake improves. Increase both doses by 2units every day. Check your blood sugars at home three times daily. Call your doctor if sugar level is more than 300 or less than 80. 10/01/14   Bonnielee Haff, MD  Levothyroxine Sodium 88 MCG CAPS Take by mouth.    [provider]   losartan (COZAAR) 50 MG tablet Take 25 mg by mouth daily. Take 1/2 tablet    [provider]  methotrexate (RHEUMATREX) 5 MG tablet Take 2 tablets (10 mg total) by mouth once a week. Caution: Chemotherapy. Protect from light. Take on fridays per patient.  OKAY TO RESUME FROM 10/08/14. Patient taking differently: Take 4 mg by mouth once a week. Caution: Chemotherapy. Protect from light. Take on fridays per patient. 4 Tabs once a week  OKAY TO RESUME FROM 10/08/14. 10/01/14   Bonnielee Haff, MD  pantoprazole (PROTONIX) 40 MG tablet Take 1 tablet (40 mg total) by mouth daily. 09/21/19   [name], DO  polyvinyl alcohol (ARTIFICIAL TEARS) 1.4 % ophthalmic solution 1 drop as needed for dry eyes.    [provider]  predniSONE (DELTASONE) 5 MG tablet Take 8 mg by mouth daily with breakfast. Take 1.5 tabs    [provider]  sertraline (ZOLOFT) 100 MG tablet Take 100 mg by mouth daily.    [provider]  tadalafil (CIALIS) 20 MG tablet Take by mouth. 08/27/19   [provider]  traMADol (ULTRAM) 50 MG tablet Take 100 mg by mouth 3 (three) times daily as needed.  08/04/19   [provider]  triamcinolone cream (KENALOG) 0.1 % Use 1 application twice daily as needed to red itchy areas below the face 09/30/18   Kennith Gain, MD    Allergies    Benazepril, Hydrocodone, Metformin, and Shellfish allergy  Review of Systems   Review of Systems  Constitutional: Negative for chills and fever.  HENT: Negative for ear pain and sore throat.   Eyes: Negative for pain and visual disturbance.  Respiratory: Negative for cough and shortness of breath.   Cardiovascular: Negative for chest pain and palpitations.  Gastrointestinal: Negative for abdominal pain and vomiting.  Genitourinary: Negative for dysuria and hematuria.  Musculoskeletal: Negative for arthralgias and back pain.  Skin: Negative for color change and rash.  Neurological: Positive  for tremors, weakness and headaches. Negative for seizures and syncope.  All other systems reviewed and are negative.   Physical Exam Updated Vital Signs BP (!) 165/95 (BP Location: Right Arm)   Pulse 95   Temp 98.9 F (37.2 C) (Oral)   Resp 18   Ht 5\' 8"  (1.727 m)   Wt 90.1 kg   SpO2 98%   BMI 30.20 kg/m   Physical Exam Vitals and nursing note reviewed.  Constitutional:      Appearance: He is well-developed.  HENT:     Head: Normocephalic and atraumatic.  Eyes:     Conjunctiva/sclera: Conjunctivae normal.  Cardiovascular:     Rate and Rhythm: Regular rhythm. Tachycardia present.     Heart sounds: No murmur heard.  Pulmonary:     Effort: Pulmonary effort is normal. No respiratory distress.     Breath sounds: Normal breath sounds.  Abdominal:     Palpations: Abdomen is soft.     Tenderness: There is no abdominal tenderness.  Musculoskeletal:        General: No swelling, tenderness, deformity or signs of injury.     Cervical back: Neck supple.  Skin:    General: Skin is warm and dry.  Neurological:     Mental Status: He is alert.     Comments: AAOx3 CN 2-12 intact, speech clear visual fields intact 4+/5 strength in b/l UE and LE Sensation to light touch intact in b/l UE and LE Normal FNF Normal gait  Psychiatric:        Mood and Affect: Mood normal.        Behavior: Behavior normal.     ED Results / Procedures / Treatments   Labs (all labs ordered are listed, but only abnormal results are displayed) Labs Reviewed  BASIC METABOLIC PANEL - Abnormal; Notable for the following components:      Result Value   Glucose, Bld 186 (*)    Creatinine, Ser 1.41 (*)    GFR, Estimated 53 (*)    All other components within normal limits  CBC WITH DIFFERENTIAL/PLATELET     EKG EKG Interpretation  Date/Time:  Monday February 22 2020 12:21:22 EDT Ventricular Rate:  98 PR Interval:    QRS Duration: 85 QT Interval:  342 QTC Calculation: 437 R Axis:   -36 Text  Interpretation: Sinus rhythm Left atrial enlargement Abnormal R-wave progression, early transition Left ventricular hypertrophy No acute changes Confirmed by Madalyn Rob [phone]) on 02/22/2020 12:34:52 PM   Radiology CT Head Wo Contrast  Result Date: 02/22/2020 CLINICAL DATA:  Frequent falls EXAM: CT HEAD WITHOUT CONTRAST TECHNIQUE: Contiguous axial images were obtained from the base of the skull through the vertex without intravenous contrast. COMPARISON:  None. FINDINGS: Brain: There is no acute intracranial hemorrhage, mass effect, or edema. Gray-white differentiation is preserved. There is no extra-axial fluid collection. Prominence of the ventricles and sulci reflects mild generalized parenchymal volume loss. Ventricular prominence is slightly disproportionate. Patchy hypoattenuation in the supratentorial white matter is nonspecific but may reflect mild chronic microvascular ischemic changes. Vascular: There is atherosclerotic calcification at the skull base. Skull: Calvarium is unremarkable. Sinuses/Orbits: No acute finding. Other: None. IMPRESSION: No acute intracranial hemorrhage or mass effect. Slightly disproportionate ventricular prominence, which could reflect communicating (normal pressure) hydrocephalus in the appropriate setting. Mild chronic microvascular ischemic changes. Electronically Signed   By: Macy Mis M.D.   On: 02/22/2020 13:08    Procedures Procedures (including critical care time)  Medications Ordered in ED Medications - No data to display  ED Course  I have reviewed the triage vital signs and the nursing notes.  Pertinent labs & imaging results that were available during my care of the patient were reviewed by me and considered in my medical decision making (see chart for details).    MDM Rules/Calculators/A&P                         69 year old male presenting to ER with concern for increased episodes of shaking.  Patient endorses a history of myopathy,  muscle weakness, gait instability.  Due to reported fall, symptoms, obtain CT head which was negative for any acute pathology.  Radiologist commented on slight disproportionate ventricular prominence could reflect NPH.  Primary  reason for CT head was to rule out trauma.  Based on chronicity of symptoms, prior work-up and evaluation by neurology, I have a much lower suspicion that patient is experiencing NPH.  He has an appointment with neurology soon.  Given no new neurologic deficits, no ongoing shaking episodes, believe patient is appropriate for outpatient management and close follow-up with primary doctor and neurology.  Please consult to transitions of care to assist with getting home health set up for PT and OT.  Patient will be discharged with his wife.  After the discussed management above, the patient was determined to be safe for discharge.  The patient was in agreement with this plan and all questions regarding their care were answered.  ED return precautions were discussed and the patient will return to the ED with any significant worsening of condition.    Final Clinical Impression(s) / ED Diagnoses Final diagnoses:  Episode of shaking    Rx / DC Orders ED Discharge Orders    None       Lucrezia Starch, MD 02/23/20 1028

## 2020-02-22 NOTE — Progress Notes (Signed)
02/22/2020 437 pm TOC referral received to arranged HH but [place] orders needed. Message sent to ED provider. [place], [place] ED TOC CM [phone]

## 2020-02-22 NOTE — ED Triage Notes (Signed)
Pt reports generalized shakes and weakness "for a while that has gotten worse in the last 3 days." pt reports pain in both legs and hips due to falls. Pt states he has been falling frequently. Pt states on Saturday he fell and hit his head. Pt A&O in NAD.

## 2020-02-23 ENCOUNTER — Telehealth: Payer: Self-pay | Admitting: *Deleted

## 2020-02-23 ENCOUNTER — Telehealth (HOSPITAL_COMMUNITY): Payer: Self-pay | Admitting: Emergency Medicine

## 2020-02-23 NOTE — Telephone Encounter (Signed)
Encounter created to place orders for home health. Original visit was 10/25 at [facility].

## 2020-02-23 NOTE — Telephone Encounter (Signed)
TOC CM attempted call to pt to arrange [facility]. Left HIPAA compliant message for a return call. [place], [place] ED TOC CM [phone]

## 2020-02-23 NOTE — Telephone Encounter (Signed)
TOC CM contacted pt and left HIPAA compliant message for return call. [place], [place] ED TOC CM [phone]

## 2020-05-07 ENCOUNTER — Ambulatory Visit: Payer: Medicare Other | Attending: Internal Medicine

## 2020-05-07 DIAGNOSIS — Z23 Encounter for immunization: Secondary | ICD-10-CM

## 2020-05-07 NOTE — Progress Notes (Signed)
   Covid-19 Vaccination Clinic  Name:  [name]    MRN: [id number] DOB: 07-31-1950  05/07/2020  [name] was observed post Covid-19 immunization for 15 minutes without incident. He was provided with Vaccine Information Sheet and instruction to access the V-Safe system.   [name] was instructed to call [phone] with any severe reactions post vaccine: Marland Kitchen Difficulty breathing  . Swelling of face and throat  . A fast heartbeat  . A bad rash all over body  . Dizziness and weakness   Immunizations Administered    Name Date Dose VIS Date Route   Moderna COVID-19 Vaccine 05/07/2020 11:43 AM 0.5 mL 02/17/2020 Intramuscular   Manufacturer: Moderna   Lot: [id number]   [place]: [id number]

## 2020-07-05 DIAGNOSIS — Z5181 Encounter for therapeutic drug level monitoring: Secondary | ICD-10-CM | POA: Diagnosis not present

## 2020-07-05 DIAGNOSIS — M791 Myalgia, unspecified site: Secondary | ICD-10-CM | POA: Diagnosis not present

## 2020-07-05 DIAGNOSIS — R531 Weakness: Secondary | ICD-10-CM | POA: Diagnosis not present

## 2020-07-05 DIAGNOSIS — R1313 Dysphagia, pharyngeal phase: Secondary | ICD-10-CM | POA: Diagnosis not present

## 2020-09-16 DIAGNOSIS — Z794 Long term (current) use of insulin: Secondary | ICD-10-CM | POA: Diagnosis not present

## 2020-09-16 DIAGNOSIS — E1142 Type 2 diabetes mellitus with diabetic polyneuropathy: Secondary | ICD-10-CM | POA: Diagnosis not present

## 2020-09-16 DIAGNOSIS — E1165 Type 2 diabetes mellitus with hyperglycemia: Secondary | ICD-10-CM | POA: Diagnosis not present

## 2020-09-16 DIAGNOSIS — I10 Essential (primary) hypertension: Secondary | ICD-10-CM | POA: Diagnosis not present

## 2020-09-16 DIAGNOSIS — N529 Male erectile dysfunction, unspecified: Secondary | ICD-10-CM | POA: Diagnosis not present

## 2020-11-03 DIAGNOSIS — N5201 Erectile dysfunction due to arterial insufficiency: Secondary | ICD-10-CM | POA: Diagnosis not present

## 2020-11-03 DIAGNOSIS — Z125 Encounter for screening for malignant neoplasm of prostate: Secondary | ICD-10-CM | POA: Diagnosis not present

## 2020-11-03 DIAGNOSIS — N401 Enlarged prostate with lower urinary tract symptoms: Secondary | ICD-10-CM | POA: Diagnosis not present

## 2020-11-03 DIAGNOSIS — R972 Elevated prostate specific antigen [PSA]: Secondary | ICD-10-CM | POA: Diagnosis not present

## 2020-11-03 DIAGNOSIS — R350 Frequency of micturition: Secondary | ICD-10-CM | POA: Diagnosis not present

## 2020-11-04 ENCOUNTER — Other Ambulatory Visit: Payer: Self-pay | Admitting: Family Medicine

## 2020-11-04 DIAGNOSIS — M6281 Muscle weakness (generalized): Secondary | ICD-10-CM | POA: Diagnosis not present

## 2020-11-04 DIAGNOSIS — G7289 Other specified myopathies: Secondary | ICD-10-CM | POA: Diagnosis not present

## 2020-11-04 NOTE — Telephone Encounter (Signed)
Left voicemail for patient to call back to get this visit scheduled. AM

## 2020-11-04 NOTE — Telephone Encounter (Signed)
Please contact patient and schedule annual appointment. Last OV was in May 2021.  Thanks

## 2020-11-30 ENCOUNTER — Other Ambulatory Visit: Payer: Self-pay | Admitting: Family Medicine

## 2020-12-05 DIAGNOSIS — R0609 Other forms of dyspnea: Secondary | ICD-10-CM | POA: Diagnosis not present

## 2020-12-08 DIAGNOSIS — R0609 Other forms of dyspnea: Secondary | ICD-10-CM | POA: Diagnosis not present

## 2020-12-28 ENCOUNTER — Encounter: Payer: Self-pay | Admitting: Family Medicine

## 2021-01-17 DIAGNOSIS — E1165 Type 2 diabetes mellitus with hyperglycemia: Secondary | ICD-10-CM | POA: Diagnosis not present

## 2021-01-17 DIAGNOSIS — Z794 Long term (current) use of insulin: Secondary | ICD-10-CM | POA: Diagnosis not present

## 2021-01-17 DIAGNOSIS — N529 Male erectile dysfunction, unspecified: Secondary | ICD-10-CM | POA: Diagnosis not present

## 2021-01-17 DIAGNOSIS — E1142 Type 2 diabetes mellitus with diabetic polyneuropathy: Secondary | ICD-10-CM | POA: Diagnosis not present

## 2021-01-17 DIAGNOSIS — Z7984 Long term (current) use of oral hypoglycemic drugs: Secondary | ICD-10-CM | POA: Diagnosis not present

## 2021-01-17 DIAGNOSIS — I10 Essential (primary) hypertension: Secondary | ICD-10-CM | POA: Diagnosis not present

## 2021-02-02 DIAGNOSIS — E119 Type 2 diabetes mellitus without complications: Secondary | ICD-10-CM | POA: Diagnosis not present

## 2021-02-02 DIAGNOSIS — H34832 Tributary (branch) retinal vein occlusion, left eye, with macular edema: Secondary | ICD-10-CM | POA: Diagnosis not present

## 2021-02-02 DIAGNOSIS — H2513 Age-related nuclear cataract, bilateral: Secondary | ICD-10-CM | POA: Diagnosis not present

## 2021-02-02 DIAGNOSIS — H35372 Puckering of macula, left eye: Secondary | ICD-10-CM | POA: Diagnosis not present

## 2021-02-10 DIAGNOSIS — I10 Essential (primary) hypertension: Secondary | ICD-10-CM | POA: Diagnosis not present

## 2021-02-10 DIAGNOSIS — Z7952 Long term (current) use of systemic steroids: Secondary | ICD-10-CM | POA: Diagnosis not present

## 2021-02-10 DIAGNOSIS — R1310 Dysphagia, unspecified: Secondary | ICD-10-CM | POA: Diagnosis not present

## 2021-02-10 DIAGNOSIS — M791 Myalgia, unspecified site: Secondary | ICD-10-CM | POA: Diagnosis not present

## 2021-02-10 DIAGNOSIS — R748 Abnormal levels of other serum enzymes: Secondary | ICD-10-CM | POA: Diagnosis not present

## 2021-02-10 DIAGNOSIS — Z79899 Other long term (current) drug therapy: Secondary | ICD-10-CM | POA: Diagnosis not present

## 2021-02-10 DIAGNOSIS — E119 Type 2 diabetes mellitus without complications: Secondary | ICD-10-CM | POA: Diagnosis not present

## 2021-02-10 DIAGNOSIS — M6289 Other specified disorders of muscle: Secondary | ICD-10-CM | POA: Diagnosis not present

## 2021-02-10 DIAGNOSIS — R531 Weakness: Secondary | ICD-10-CM | POA: Diagnosis not present

## 2021-02-10 DIAGNOSIS — G729 Myopathy, unspecified: Secondary | ICD-10-CM | POA: Diagnosis not present

## 2021-02-10 DIAGNOSIS — M6250 Muscle wasting and atrophy, not elsewhere classified, unspecified site: Secondary | ICD-10-CM | POA: Diagnosis not present

## 2021-02-10 DIAGNOSIS — R4189 Other symptoms and signs involving cognitive functions and awareness: Secondary | ICD-10-CM | POA: Diagnosis not present

## 2021-03-09 LAB — HM DIABETES EYE EXAM

## 2021-04-30 ENCOUNTER — Other Ambulatory Visit: Payer: Self-pay | Admitting: Family Medicine

## 2021-06-19 DIAGNOSIS — N529 Male erectile dysfunction, unspecified: Secondary | ICD-10-CM | POA: Diagnosis not present

## 2021-06-19 DIAGNOSIS — Z794 Long term (current) use of insulin: Secondary | ICD-10-CM | POA: Diagnosis not present

## 2021-06-19 DIAGNOSIS — E1142 Type 2 diabetes mellitus with diabetic polyneuropathy: Secondary | ICD-10-CM | POA: Diagnosis not present

## 2021-06-19 DIAGNOSIS — Z7952 Long term (current) use of systemic steroids: Secondary | ICD-10-CM | POA: Diagnosis not present

## 2021-06-19 DIAGNOSIS — E1165 Type 2 diabetes mellitus with hyperglycemia: Secondary | ICD-10-CM | POA: Diagnosis not present

## 2021-06-19 DIAGNOSIS — I10 Essential (primary) hypertension: Secondary | ICD-10-CM | POA: Diagnosis not present

## 2021-06-19 DIAGNOSIS — Z7984 Long term (current) use of oral hypoglycemic drugs: Secondary | ICD-10-CM | POA: Diagnosis not present

## 2021-06-19 LAB — HEMOGLOBIN A1C: Hemoglobin A1C: 8.5

## 2021-07-18 DIAGNOSIS — R748 Abnormal levels of other serum enzymes: Secondary | ICD-10-CM | POA: Diagnosis not present

## 2021-07-18 DIAGNOSIS — M609 Myositis, unspecified: Secondary | ICD-10-CM | POA: Diagnosis not present

## 2021-07-18 DIAGNOSIS — M6250 Muscle wasting and atrophy, not elsewhere classified, unspecified site: Secondary | ICD-10-CM | POA: Diagnosis not present

## 2021-07-18 DIAGNOSIS — I10 Essential (primary) hypertension: Secondary | ICD-10-CM | POA: Diagnosis not present

## 2021-07-18 DIAGNOSIS — R0609 Other forms of dyspnea: Secondary | ICD-10-CM | POA: Diagnosis not present

## 2021-07-18 DIAGNOSIS — Z7952 Long term (current) use of systemic steroids: Secondary | ICD-10-CM | POA: Diagnosis not present

## 2021-07-18 DIAGNOSIS — G729 Myopathy, unspecified: Secondary | ICD-10-CM | POA: Diagnosis not present

## 2021-07-18 DIAGNOSIS — R600 Localized edema: Secondary | ICD-10-CM | POA: Diagnosis not present

## 2021-07-18 DIAGNOSIS — R1310 Dysphagia, unspecified: Secondary | ICD-10-CM | POA: Diagnosis not present

## 2021-07-18 DIAGNOSIS — Z79899 Other long term (current) drug therapy: Secondary | ICD-10-CM | POA: Diagnosis not present

## 2021-07-18 DIAGNOSIS — Z794 Long term (current) use of insulin: Secondary | ICD-10-CM | POA: Diagnosis not present

## 2021-07-18 DIAGNOSIS — R29898 Other symptoms and signs involving the musculoskeletal system: Secondary | ICD-10-CM | POA: Diagnosis not present

## 2021-07-18 DIAGNOSIS — E119 Type 2 diabetes mellitus without complications: Secondary | ICD-10-CM | POA: Diagnosis not present

## 2021-07-21 ENCOUNTER — Emergency Department (HOSPITAL_BASED_OUTPATIENT_CLINIC_OR_DEPARTMENT_OTHER): Payer: Medicare Other

## 2021-07-21 ENCOUNTER — Encounter (HOSPITAL_BASED_OUTPATIENT_CLINIC_OR_DEPARTMENT_OTHER): Payer: Self-pay

## 2021-07-21 ENCOUNTER — Other Ambulatory Visit: Payer: Self-pay

## 2021-07-21 ENCOUNTER — Inpatient Hospital Stay (HOSPITAL_BASED_OUTPATIENT_CLINIC_OR_DEPARTMENT_OTHER)
Admission: EM | Admit: 2021-07-21 | Discharge: 2021-07-27 | DRG: 163 | Disposition: A | Discharge status: Home Health | Payer: Medicare Other | Attending: Internal Medicine | Admitting: Internal Medicine

## 2021-07-21 DIAGNOSIS — F4310 Post-traumatic stress disorder, unspecified: Secondary | ICD-10-CM | POA: Diagnosis present

## 2021-07-21 DIAGNOSIS — E11649 Type 2 diabetes mellitus with hypoglycemia without coma: Secondary | ICD-10-CM | POA: Diagnosis not present

## 2021-07-21 DIAGNOSIS — N1831 Chronic kidney disease, stage 3a: Secondary | ICD-10-CM | POA: Diagnosis present

## 2021-07-21 DIAGNOSIS — R578 Other shock: Secondary | ICD-10-CM | POA: Diagnosis not present

## 2021-07-21 DIAGNOSIS — Z7984 Long term (current) use of oral hypoglycemic drugs: Secondary | ICD-10-CM

## 2021-07-21 DIAGNOSIS — I959 Hypotension, unspecified: Secondary | ICD-10-CM | POA: Diagnosis not present

## 2021-07-21 DIAGNOSIS — I82401 Acute embolism and thrombosis of unspecified deep veins of right lower extremity: Secondary | ICD-10-CM | POA: Diagnosis not present

## 2021-07-21 DIAGNOSIS — I82441 Acute embolism and thrombosis of right tibial vein: Secondary | ICD-10-CM | POA: Diagnosis not present

## 2021-07-21 DIAGNOSIS — I10 Essential (primary) hypertension: Secondary | ICD-10-CM

## 2021-07-21 DIAGNOSIS — R778 Other specified abnormalities of plasma proteins: Secondary | ICD-10-CM | POA: Diagnosis present

## 2021-07-21 DIAGNOSIS — Z794 Long term (current) use of insulin: Secondary | ICD-10-CM

## 2021-07-21 DIAGNOSIS — D696 Thrombocytopenia, unspecified: Secondary | ICD-10-CM | POA: Diagnosis not present

## 2021-07-21 DIAGNOSIS — K219 Gastro-esophageal reflux disease without esophagitis: Secondary | ICD-10-CM

## 2021-07-21 DIAGNOSIS — Z20822 Contact with and (suspected) exposure to covid-19: Secondary | ICD-10-CM | POA: Diagnosis not present

## 2021-07-21 DIAGNOSIS — I214 Non-ST elevation (NSTEMI) myocardial infarction: Secondary | ICD-10-CM | POA: Diagnosis present

## 2021-07-21 DIAGNOSIS — D649 Anemia, unspecified: Secondary | ICD-10-CM | POA: Diagnosis present

## 2021-07-21 DIAGNOSIS — I129 Hypertensive chronic kidney disease with stage 1 through stage 4 chronic kidney disease, or unspecified chronic kidney disease: Secondary | ICD-10-CM | POA: Diagnosis not present

## 2021-07-21 DIAGNOSIS — E1140 Type 2 diabetes mellitus with diabetic neuropathy, unspecified: Secondary | ICD-10-CM | POA: Diagnosis present

## 2021-07-21 DIAGNOSIS — I97638 Postprocedural hematoma of a circulatory system organ or structure following other circulatory system procedure: Secondary | ICD-10-CM | POA: Diagnosis not present

## 2021-07-21 DIAGNOSIS — I700 Atherosclerosis of aorta: Secondary | ICD-10-CM | POA: Diagnosis not present

## 2021-07-21 DIAGNOSIS — Z6831 Body mass index (BMI) 31.0-31.9, adult: Secondary | ICD-10-CM

## 2021-07-21 DIAGNOSIS — M609 Myositis, unspecified: Secondary | ICD-10-CM | POA: Diagnosis present

## 2021-07-21 DIAGNOSIS — N401 Enlarged prostate with lower urinary tract symptoms: Secondary | ICD-10-CM | POA: Diagnosis present

## 2021-07-21 DIAGNOSIS — Z79890 Hormone replacement therapy (postmenopausal): Secondary | ICD-10-CM

## 2021-07-21 DIAGNOSIS — I82431 Acute embolism and thrombosis of right popliteal vein: Secondary | ICD-10-CM | POA: Diagnosis not present

## 2021-07-21 DIAGNOSIS — I2692 Saddle embolus of pulmonary artery without acute cor pulmonale: Secondary | ICD-10-CM | POA: Diagnosis not present

## 2021-07-21 DIAGNOSIS — I517 Cardiomegaly: Secondary | ICD-10-CM | POA: Diagnosis not present

## 2021-07-21 DIAGNOSIS — F411 Generalized anxiety disorder: Secondary | ICD-10-CM | POA: Diagnosis present

## 2021-07-21 DIAGNOSIS — M069 Rheumatoid arthritis, unspecified: Secondary | ICD-10-CM | POA: Diagnosis present

## 2021-07-21 DIAGNOSIS — E1165 Type 2 diabetes mellitus with hyperglycemia: Secondary | ICD-10-CM

## 2021-07-21 DIAGNOSIS — Z91013 Allergy to seafood: Secondary | ICD-10-CM

## 2021-07-21 DIAGNOSIS — Z833 Family history of diabetes mellitus: Secondary | ICD-10-CM

## 2021-07-21 DIAGNOSIS — Z8249 Family history of ischemic heart disease and other diseases of the circulatory system: Secondary | ICD-10-CM

## 2021-07-21 DIAGNOSIS — N189 Chronic kidney disease, unspecified: Secondary | ICD-10-CM | POA: Diagnosis present

## 2021-07-21 DIAGNOSIS — I130 Hypertensive heart and chronic kidney disease with heart failure and stage 1 through stage 4 chronic kidney disease, or unspecified chronic kidney disease: Secondary | ICD-10-CM | POA: Diagnosis present

## 2021-07-21 DIAGNOSIS — I2602 Saddle embolus of pulmonary artery with acute cor pulmonale: Principal | ICD-10-CM

## 2021-07-21 DIAGNOSIS — I2699 Other pulmonary embolism without acute cor pulmonale: Secondary | ICD-10-CM | POA: Diagnosis not present

## 2021-07-21 DIAGNOSIS — I5041 Acute combined systolic (congestive) and diastolic (congestive) heart failure: Secondary | ICD-10-CM | POA: Diagnosis not present

## 2021-07-21 DIAGNOSIS — F410 Panic disorder [episodic paroxysmal anxiety] without agoraphobia: Secondary | ICD-10-CM | POA: Diagnosis present

## 2021-07-21 DIAGNOSIS — Z79899 Other long term (current) drug therapy: Secondary | ICD-10-CM

## 2021-07-21 DIAGNOSIS — J9601 Acute respiratory failure with hypoxia: Secondary | ICD-10-CM | POA: Diagnosis not present

## 2021-07-21 DIAGNOSIS — E039 Hypothyroidism, unspecified: Secondary | ICD-10-CM

## 2021-07-21 DIAGNOSIS — E1122 Type 2 diabetes mellitus with diabetic chronic kidney disease: Secondary | ICD-10-CM | POA: Diagnosis not present

## 2021-07-21 DIAGNOSIS — R000 Tachycardia, unspecified: Secondary | ICD-10-CM | POA: Diagnosis not present

## 2021-07-21 DIAGNOSIS — I2609 Other pulmonary embolism with acute cor pulmonale: Secondary | ICD-10-CM | POA: Diagnosis not present

## 2021-07-21 DIAGNOSIS — R0602 Shortness of breath: Secondary | ICD-10-CM | POA: Diagnosis not present

## 2021-07-21 DIAGNOSIS — Z781 Physical restraint status: Secondary | ICD-10-CM

## 2021-07-21 DIAGNOSIS — E119 Type 2 diabetes mellitus without complications: Secondary | ICD-10-CM

## 2021-07-21 DIAGNOSIS — I82409 Acute embolism and thrombosis of unspecified deep veins of unspecified lower extremity: Secondary | ICD-10-CM | POA: Diagnosis present

## 2021-07-21 DIAGNOSIS — E669 Obesity, unspecified: Secondary | ICD-10-CM | POA: Diagnosis present

## 2021-07-21 DIAGNOSIS — I2510 Atherosclerotic heart disease of native coronary artery without angina pectoris: Secondary | ICD-10-CM | POA: Diagnosis present

## 2021-07-21 DIAGNOSIS — Z7952 Long term (current) use of systemic steroids: Secondary | ICD-10-CM

## 2021-07-21 DIAGNOSIS — R4182 Altered mental status, unspecified: Secondary | ICD-10-CM | POA: Diagnosis not present

## 2021-07-21 DIAGNOSIS — N400 Enlarged prostate without lower urinary tract symptoms: Secondary | ICD-10-CM | POA: Diagnosis present

## 2021-07-21 DIAGNOSIS — E872 Acidosis, unspecified: Secondary | ICD-10-CM | POA: Diagnosis not present

## 2021-07-21 DIAGNOSIS — N179 Acute kidney failure, unspecified: Secondary | ICD-10-CM | POA: Diagnosis present

## 2021-07-21 DIAGNOSIS — I5021 Acute systolic (congestive) heart failure: Secondary | ICD-10-CM | POA: Diagnosis not present

## 2021-07-21 DIAGNOSIS — R338 Other retention of urine: Secondary | ICD-10-CM | POA: Diagnosis not present

## 2021-07-21 DIAGNOSIS — R569 Unspecified convulsions: Secondary | ICD-10-CM | POA: Diagnosis not present

## 2021-07-21 DIAGNOSIS — R1032 Left lower quadrant pain: Secondary | ICD-10-CM | POA: Diagnosis not present

## 2021-07-21 DIAGNOSIS — F419 Anxiety disorder, unspecified: Secondary | ICD-10-CM | POA: Diagnosis not present

## 2021-07-21 HISTORY — DX: Hiccough: R06.6

## 2021-07-21 HISTORY — DX: Other specified myopathies: G72.89

## 2021-07-21 HISTORY — DX: Bronchitis, not specified as acute or chronic: J40

## 2021-07-21 LAB — URINALYSIS, ROUTINE W REFLEX MICROSCOPIC
Bilirubin Urine: NEGATIVE
Glucose, UA: >=500 mg/dL — AB
Hgb urine dipstick: NEGATIVE
Ketones, ur: NEGATIVE mg/dL
Leukocytes,Ua: NEGATIVE
Nitrite: NEGATIVE
Protein, ur: NEGATIVE mg/dL
Specific Gravity, Urine: <=1.005 (ref 1.005–1.030)
pH: 5.5 (ref 5.0–8.0)

## 2021-07-21 LAB — COMPREHENSIVE METABOLIC PANEL
ALT: 27 U/L (ref 0–44)
AST: 42 U/L — ABNORMAL HIGH (ref 15–41)
Albumin: 3.7 g/dL (ref 3.5–5.0)
Alkaline Phosphatase: 47 U/L (ref 38–126)
Anion gap: 10 (ref 5–15)
BUN: 20 mg/dL (ref 8–23)
CO2: 28 mmol/L (ref 22–32)
Calcium: 10 mg/dL (ref 8.9–10.3)
Chloride: 99 mmol/L (ref 98–111)
Creatinine, Ser: 1.63 mg/dL — ABNORMAL HIGH (ref 0.61–1.24)
GFR, Estimated: 44 mL/min — ABNORMAL LOW (ref >60)
Glucose, Bld: 350 mg/dL — ABNORMAL HIGH (ref 70–99)
Potassium: 5.4 mmol/L — ABNORMAL HIGH (ref 3.5–5.1)
Sodium: 137 mmol/L (ref 135–145)
Total Bilirubin: 1.2 mg/dL (ref 0.3–1.2)
Total Protein: 6.4 g/dL — ABNORMAL LOW (ref 6.5–8.1)

## 2021-07-21 LAB — PROTIME-INR
INR: 1.1 (ref 0.8–1.2)
Prothrombin Time: 14.5 seconds (ref 11.4–15.2)

## 2021-07-21 LAB — CBG MONITORING, ED: Glucose-Capillary: 343 mg/dL — ABNORMAL HIGH (ref 70–99)

## 2021-07-21 LAB — CBC WITH DIFFERENTIAL/PLATELET
Abs Immature Granulocytes: 0.19 10*3/uL — ABNORMAL HIGH (ref 0.00–0.07)
Basophils Absolute: 0 10*3/uL (ref 0.0–0.1)
Basophils Relative: 0 %
Eosinophils Absolute: 0 10*3/uL (ref 0.0–0.5)
Eosinophils Relative: 0 %
HCT: 41 % (ref 39.0–52.0)
Hemoglobin: 13.7 g/dL (ref 13.0–17.0)
Immature Granulocytes: 2 %
Lymphocytes Relative: 7 %
Lymphs Abs: 0.6 10*3/uL — ABNORMAL LOW (ref 0.7–4.0)
MCH: 31.6 pg (ref 26.0–34.0)
MCHC: 33.4 g/dL (ref 30.0–36.0)
MCV: 94.5 fL (ref 80.0–100.0)
Monocytes Absolute: 0.8 10*3/uL (ref 0.1–1.0)
Monocytes Relative: 9 %
Neutro Abs: 7.3 10*3/uL (ref 1.7–7.7)
Neutrophils Relative %: 82 %
Platelets: 130 10*3/uL — ABNORMAL LOW (ref 150–400)
RBC: 4.34 MIL/uL (ref 4.22–5.81)
RDW: 14.1 % (ref 11.5–15.5)
WBC: 8.8 10*3/uL (ref 4.0–10.5)
nRBC: 0.3 % — ABNORMAL HIGH (ref 0.0–0.2)

## 2021-07-21 LAB — URINALYSIS, MICROSCOPIC (REFLEX): WBC, UA: NONE SEEN WBC/hpf (ref 0–5)

## 2021-07-21 LAB — RESP PANEL BY RT-PCR (FLU A&B, COVID) ARPGX2
Influenza A by PCR: NEGATIVE
Influenza B by PCR: NEGATIVE
SARS Coronavirus 2 by RT PCR: NEGATIVE

## 2021-07-21 LAB — LACTIC ACID, PLASMA
Lactic Acid, Venous: 4.9 mmol/L (ref 0.5–1.9)
Lactic Acid, Venous: 4.1 mmol/L (ref 0.5–1.9)

## 2021-07-21 LAB — LIPASE, BLOOD: Lipase: 27 U/L (ref 11–51)

## 2021-07-21 LAB — BRAIN NATRIURETIC PEPTIDE: B Natriuretic Peptide: 64.8 pg/mL (ref 0.0–100.0)

## 2021-07-21 MED ORDER — LACTATED RINGERS IV BOLUS
1000.0000 mL | Freq: Once | INTRAVENOUS | Status: AC
  Administered 2021-07-21: 1000 mL via INTRAVENOUS

## 2021-07-21 MED ORDER — HEPARIN (PORCINE) 25000 UT/250ML-% IV SOLN
1450.0000 [IU]/h | INTRAVENOUS | Status: DC
  Administered 2021-07-21 – 2021-07-22 (×2): 1600 [IU]/h via INTRAVENOUS
  Administered 2021-07-23: 1250 [IU]/h via INTRAVENOUS
  Administered 2021-07-24: 1050 [IU]/h via INTRAVENOUS
  Administered 2021-07-25 (×2): 1250 [IU]/h via INTRAVENOUS
  Administered 2021-07-26: 1450 [IU]/h via INTRAVENOUS
  Filled 2021-07-21 (×6): qty 250

## 2021-07-21 MED ORDER — IOHEXOL 350 MG/ML SOLN
100.0000 mL | Freq: Once | INTRAVENOUS | Status: AC | PRN
  Administered 2021-07-21: 100 mL via INTRAVENOUS

## 2021-07-21 MED ORDER — LACTATED RINGERS IV SOLN: INTRAVENOUS | Status: DC

## 2021-07-21 MED ORDER — HEPARIN BOLUS VIA INFUSION
7000.0000 [IU] | Freq: Once | INTRAVENOUS | Status: AC
  Administered 2021-07-21: 7000 [IU] via INTRAVENOUS

## 2021-07-21 NOTE — ED Notes (Signed)
Patient transported to CT with nurse on cardiac monitor  ?

## 2021-07-21 NOTE — ED Provider Notes (Signed)
Patient transferred from [facility] for saddle PE w/ R heart strain to be seen by [facility] and PCCM for ultimate disposition. VSS. Appears well.  ? ?PCCM recommends [place] admission, TRH consulted.  ?  ?Merrily Pew, MD ?07/23/21 0116 ? ?

## 2021-07-21 NOTE — ED Notes (Signed)
Wife is at bedside. RN explained plan of care and educated.  ?

## 2021-07-21 NOTE — ED Triage Notes (Signed)
Pt transferred from [place] center by [facility] for Saddle PE w/rt heart strain. Pt also has 2 DVT in his rt leg, one occlusive and one nonocclusive. No DVT in the Lt leg. Pt received 7000 unit bolus of heparin at [place]. Continuous Heparin running at 1,600 units/hr. VSS, A&Ox4. ?

## 2021-07-21 NOTE — H&P (Addendum)
?History and Physical  ? ?[name] KWI:[id number] DOB: 1950/12/18 DOA: 07/21/2021 ? ?PCP: [name], DO  ? ?Patient coming from: Home ? ?Chief Complaint: Shortness of breath, chest pain, abdominal pain ? ?HPI: [name] is a 71 y.o. male with medical history significant of diabetes, hypertension, anemia, hypothyroidism, BPH, GERD, PTSD, autoimmune myositis presenting with weakness, pain, confusion. ? ?Patient presented with multiple complaints today.  Noted 2 episodes of confusion earlier today both were brief and always associated with a low glucose of 66.  Also reports feeling generally weak for a while with some associated chest pain and abdominal pain.  Has had ongoing right lower extremity pain and swelling to some extent for the past year.  Also reports shortness of breath. ? ?Denies fevers, chills, constipation, diarrhea, nausea, vomiting. ? ?ED Course: Vital signs in the ED significant for blood pressure in the 24Q to 683M systolic, heart rate in the 110s to 120s, respiratory rate in the 20s, requiring 2 to 4 L to maintain saturations.  Lab work-up included CMP showing potassium 5.4, creatinine of 1.63 with unclear baseline, glucose 350, protein 6.4, AST 42.  CBC with platelets of 130.  PT and INR within normal limits.  Lipase normal.  Troponin pending.  BNP normal.  Lactic acid elevated to 4.9 and improving to 4.1 on repeat.  Respiratory panel flu COVID-negative.  Urinalysis with glucose only.  Chest x-ray showed no acute normality, CT head showed no acute abnormality, CT PE study showed saddle pulmonary embolus with right heart strain and RV to LV ratio of 1.35 consistent with at least submassive PE.  CT of the abdomen pelvis showed no acute abnormality.  DVT study showed occlusive right popliteal DVT.  Patient started on heparin and received 2 L of IV fluids.  Neurology was consulted and will see the patient, recommend medicine admission to progressive for now as patient has remained  stable. ? ?Review of Systems: As per HPI otherwise all other systems reviewed and are negative. ? ?Past Medical History:  ?Diagnosis Date  ? Autoimmune disease ([place])   ? Chronic hiccups   ? Diabetes mellitus without complication ([place])   ? GERD (gastroesophageal reflux disease)   ? Hypothyroidism   ? Joint pain   ? Necrotizing myopathy   ? PTSD (post-traumatic stress disorder)   ? ? ?Past Surgical History:  ?Procedure Laterality Date  ? BIOPSY SHOULDER    ? Left  ? ? ?Social History ? reports that he has never smoked. He has never used smokeless tobacco. He reports that he does not drink alcohol and does not use drugs. ? ?Allergies  ?Allergen Reactions  ? Benazepril Cough  ? Gabapentin Swelling  ? Hydrocodone   ? Shellfish Allergy   ? ? ?Family History  ?Problem Relation Age of Onset  ? Hypertension Mother   ? Diabetes Mother   ? Arthritis Mother   ? Depression Mother   ? Heart attack Father   ? Hypertension Father   ?Reviewed on admission ? ?Prior to Admission medications   ?Medication Sig Start Date End Date Taking? Authorizing Provider  ?metFORMIN (GLUCOPHAGE) 1000 MG tablet Take 1,000 mg by mouth 2 (two) times daily. 06/20/21  Yes [provider]  ?methotrexate (RHEUMATREX) 5 MG tablet Take 2 tablets (10 mg total) by mouth once a week. Caution: Chemotherapy. Protect from light. Take on fridays per patient. ? ?OKAY TO RESUME FROM 10/08/14. ?Patient taking differently: Take 30 mg by mouth once a week. Caution: Chemotherapy. Protect from light.  Take on fridays per patient. 6 Tabs once a week 10/01/14  Yes Bonnielee Haff, MD  ?acarbose (PRECOSE) 100 MG tablet Take 100 mg by mouth 3 (three) times daily with meals.    [provider]  ?calcium carbonate (OS-CAL) 600 MG TABS tablet Take 600 mg by mouth 2 (two) times daily with a meal.    [provider]  ?DULoxetine (CYMBALTA) 30 MG capsule Take 30 mg by mouth daily. 09/25/19   [provider]  ?empagliflozin (JARDIANCE) 25 MG TABS tablet  Take 10 mg by mouth. Take 1/2 tab 08/25/18   [provider]  ?folic acid (FOLVITE) 1 MG tablet Take 1 mg by mouth daily.    [provider]  ?gabapentin (NEURONTIN) 100 MG capsule Take 1 capsule (100 mg total) by mouth 3 (three) times daily. 01/17/20   [name], DO  ?glipiZIDE (GLUCOTROL XL) 10 MG 24 hr tablet Take 10 mg by mouth 2 (two) times daily.     [provider]  ?hydrOXYzine (ATARAX/VISTARIL) 25 MG tablet TAKE 1 TABLET (25 MG TOTAL) BY MOUTH 3 (THREE) TIMES DAILY AS NEEDED. FOR ITCHING 12/28/19   [name], DO  ?insulin glargine (LANTUS) 100 UNIT/ML injection Inject 0.46-0.56 mLs (46-56 Units total) into the skin at bedtime. For now take 30 units twice a day and then increase to your usual dose as your oral intake improves. Increase both doses by 2units every day. Check your blood sugars at home three times daily. Call your doctor if sugar level is more than 300 or less than 80. 10/01/14   Bonnielee Haff, MD  ?Levothyroxine Sodium 88 MCG CAPS Take by mouth.    [provider]  ?losartan (COZAAR) 50 MG tablet Take 25 mg by mouth daily. Take 1/2 tablet    [provider]  ?pantoprazole (PROTONIX) 40 MG tablet TAKE 1 TABLET BY MOUTH EVERY DAY 11/04/20   [name], DO  ?polyvinyl alcohol (ARTIFICIAL TEARS) 1.4 % ophthalmic solution 1 drop as needed for dry eyes.    [provider]  ?predniSONE (DELTASONE) 5 MG tablet Take 8 mg by mouth daily with breakfast. Take 1.5 tabs    [provider]  ?sertraline (ZOLOFT) 100 MG tablet Take 100 mg by mouth daily.    [provider]  ?tadalafil (CIALIS) 20 MG tablet Take by mouth. 08/27/19   [provider]  ?traMADol (ULTRAM) 50 MG tablet Take 100 mg by mouth 3 (three) times daily as needed.  08/04/19   [provider]  ?triamcinolone cream (KENALOG) 0.1 % Use 1 application twice daily as needed to red itchy areas below the face 09/30/18   Kennith Gain, MD   ? ? ?Physical Exam: ?Vitals:  ? 07/21/21 2154 07/21/21 2304 07/21/21 2315 07/21/21 2351  ?BP: (!) 95/54 102/77 113/79 113/79  ?Pulse: (!) 119 (!) 112 (!) 113 (!) 109  ?Resp: (!) 25 (!) 21 20 (!) 21  ?Temp: 98.1 ?F (36.7 ?C) 99 ?F (37.2 ?C)    ?TempSrc: Oral Oral    ?SpO2: 100% 97% 94% 96%  ?Weight:      ?Height:      ? ? ?Physical Exam ?Constitutional:   ?   General: He is not in acute distress. ?   Appearance: Normal appearance.  ?HENT:  ?   Head: Normocephalic and atraumatic.  ?   Mouth/Throat:  ?   Mouth: Mucous membranes are moist.  ?   Pharynx: Oropharynx is clear.  ?Eyes:  ?  Extraocular Movements: Extraocular movements intact.  ?   Pupils: Pupils are equal, round, and reactive to light.  ?Cardiovascular:  ?   Rate and Rhythm: Regular rhythm. Tachycardia present.  ?   Pulses: Normal pulses.  ?   Heart sounds: Normal heart sounds.  ?Pulmonary:  ?   Effort: Pulmonary effort is normal. No respiratory distress.  ?   Breath sounds: Normal breath sounds.  ?Abdominal:  ?   General: Bowel sounds are normal. There is no distension.  ?   Palpations: Abdomen is soft.  ?   Tenderness: There is no abdominal tenderness.  ?Musculoskeletal:     ?   General: Tenderness (LE) present. No swelling or deformity.  ?   Right lower leg: Edema present.  ?Skin: ?   General: Skin is warm and dry.  ?Neurological:  ?   General: No focal deficit present.  ?   Mental Status: Mental status is at baseline.  ? ? ?Labs on Admission: I have personally reviewed following labs and imaging studies ? ?CBC: ?Recent Labs  ?Lab 07/21/21 ?2032  ?WBC 8.8  ?NEUTROABS 7.3  ?HGB 13.7  ?HCT 41.0  ?MCV 94.5  ?PLT 130*  ? ? ?Basic Metabolic Panel: ?Recent Labs  ?Lab 07/21/21 ?1800  ?NA 137  ?K 5.4*  ?CL 99  ?CO2 28  ?GLUCOSE 350*  ?BUN 20  ?CREATININE 1.63*  ?CALCIUM 10.0  ? ? ?GFR: ?Estimated Creatinine Clearance: 45.8 mL/min (A) (by C-G formula based on SCr of 1.63 mg/dL (H)). ? ?Liver Function Tests: ?Recent Labs  ?Lab 07/21/21 ?1800  ?AST 42*  ?ALT 27   ?ALKPHOS 47  ?BILITOT 1.2  ?PROT 6.4*  ?ALBUMIN 3.7  ? ? ?Urine analysis: ?   ?Component Value Date/Time  ? [place] YELLOW 07/21/2021 2058  ? APPEARANCEUR CLEAR 07/21/2021 2058  ? LABSPEC <=1.005 07/21/2021 2058  ? PHUR

## 2021-07-21 NOTE — Progress Notes (Signed)
ANTICOAGULATION CONSULT NOTE - Initial Consult ? ?Pharmacy Consult for Heparin ?Indication: pulmonary embolus ? ?Allergies  ?Allergen Reactions  ? Benazepril Cough  ? Hydrocodone   ? Shellfish Allergy   ? ? ?Patient Measurements: ?Weight: 94.8 kg (209 lb) ?Heparin Dosing Weight: 88.3 kg ? ?Vital Signs: ?Temp: 97.9 ?F (36.6 ?C) (03/24 1745) ?Temp Source: Rectal (03/24 1745) ?BP: 130/93 (03/24 1933) ?Pulse Rate: 115 (03/24 1933) ? ?Labs: ?Recent Labs  ?  07/21/21 ?1800  ?CREATININE 1.63*  ? ? ?CrCl cannot be calculated (Unknown ideal weight.). ? ? ?Medical History: ?Past Medical History:  ?Diagnosis Date  ? Autoimmune disease ([place])   ? Chronic hiccups   ? Diabetes mellitus without complication ([place])   ? GERD (gastroesophageal reflux disease)   ? Hypothyroidism   ? Joint pain   ? Necrotizing myopathy   ? PTSD (post-traumatic stress disorder)   ? ? ?Medications:  ?(Not in a hospital admission) ? ?Scheduled:  ?Infusions:  ? lactated ringers    ? ?PRN:  ? ?Assessment: ?30 yom with a history of DM, HTN, neuropathy, myopathy/myositis, RA. Patient is presenting with weakness, chest pain. Heparin per pharmacy consult placed for pulmonary embolus on CT angio chest. ? ?Patient is not on anticoagulation prior to arrival. ? ?Hgb ip; plt ip ? ?Goal of Therapy:  ?Heparin level 0.3-0.7 units/ml ?Monitor platelets by anticoagulation protocol: Yes ?  ?Plan:  ?Give 7000 units bolus x 1 ?Start heparin infusion at 1600 units/hr ?Check anti-Xa level at 0300 and daily while on heparin ?Continue to monitor H&H and platelets ? ?Lorelei Pont, PharmD, BCPS ?07/21/2021 7:36 PM ?ED Clinical Pharmacist -  [phone] ? ? ? ?

## 2021-07-21 NOTE — ED Provider Notes (Signed)
?[facility] ?Provider Note ? ? ?CSN: [id number] ?Arrival date & time: 07/21/21  1647 ? ?  ? ?History ? ?Chief Complaint  ?Patient presents with  ? Multiple c/o  ? ? ?[name] is a 71 y.o. male.  Presenting to the ER with multiple different complaints.  States that he had a couple episodes today where he felt confused, lightheaded, at 1 point this was associated with pain in his head.  Wife also noticed that his mouth looked funny.  All these episodes were brief.  1 was associated with mildly low blood sugar at 66.  States that he feels generally weak and has been having pain all over including pain in his chest and abdomen.  Also having some back pain.  Does have some swelling in his right leg.  States that swelling in the right leg has been going on for about a year.  Also has been having some shortness of breath lately. ? ? ? ?HPI ? ?  ? ?Home Medications ?Prior to Admission medications   ?Medication Sig Start Date End Date Taking? Authorizing Provider  ?acarbose (PRECOSE) 100 MG tablet Take 100 mg by mouth 3 (three) times daily with meals.    [provider]  ?calcium carbonate (OS-CAL) 600 MG TABS tablet Take 600 mg by mouth 2 (two) times daily with a meal.    [provider]  ?DULoxetine (CYMBALTA) 30 MG capsule Take 30 mg by mouth daily. 09/25/19   [provider]  ?empagliflozin (JARDIANCE) 25 MG TABS tablet Take 10 mg by mouth. Take 1/2 tab 08/25/18   [provider]  ?folic acid (FOLVITE) 1 MG tablet Take 1 mg by mouth daily.    [provider]  ?gabapentin (NEURONTIN) 100 MG capsule Take 1 capsule (100 mg total) by mouth 3 (three) times daily. 01/17/20   [name], DO  ?glipiZIDE (GLUCOTROL XL) 10 MG 24 hr tablet Take 10 mg by mouth 2 (two) times daily.     [provider]  ?hydrOXYzine (ATARAX/VISTARIL) 25 MG tablet TAKE 1 TABLET (25 MG TOTAL) BY MOUTH 3 (THREE) TIMES DAILY AS NEEDED. FOR ITCHING 12/28/19   [name], DO  ?insulin glargine (LANTUS) 100 UNIT/ML injection Inject 0.46-0.56 mLs (46-56 Units total) into the skin at bedtime. For now take 30 units twice a day and then increase to your usual dose as your oral intake improves. Increase both doses by 2units every day. Check your blood sugars at home three times daily. Call your doctor if sugar level is more than 300 or less than 80. 10/01/14   Bonnielee Haff, MD  ?Levothyroxine Sodium 88 MCG CAPS Take by mouth.    [provider]  ?losartan (COZAAR) 50 MG tablet Take 25 mg by mouth daily. Take 1/2 tablet    [provider]  ?methotrexate (RHEUMATREX) 5 MG tablet Take 2 tablets (10 mg total) by mouth once a week. Caution: Chemotherapy. Protect from light. Take on fridays per patient. ? ?OKAY TO RESUME FROM 10/08/14. ?Patient taking differently: Take 4 mg by mouth once a week. Caution: Chemotherapy. Protect from light. Take on fridays per patient. 4 Tabs once a week ? ?OKAY TO RESUME FROM 10/08/14. 10/01/14   Bonnielee Haff, MD  ?pantoprazole (PROTONIX) 40 MG tablet TAKE 1 TABLET BY MOUTH EVERY DAY 11/04/20   [name], DO  ?polyvinyl alcohol (ARTIFICIAL TEARS) 1.4 % ophthalmic solution 1 drop as needed for dry eyes.    [provider]  ?predniSONE (  DELTASONE) 5 MG tablet Take 8 mg by mouth daily with breakfast. Take 1.5 tabs    [provider]  ?sertraline (ZOLOFT) 100 MG tablet Take 100 mg by mouth daily.    [provider]  ?tadalafil (CIALIS) 20 MG tablet Take by mouth. 08/27/19   [provider]  ?traMADol (ULTRAM) 50 MG tablet Take 100 mg by mouth 3 (three) times daily as needed.  08/04/19   [provider]  ?triamcinolone cream (KENALOG) 0.1 % Use 1 application twice daily as needed to red itchy areas below the face 09/30/18   Kennith Gain, MD  ?   ? ?Allergies    ?Benazepril, Hydrocodone, and Shellfish allergy   ? ?Review of Systems   ?Review of Systems  ?Constitutional:  Positive for  fatigue. Negative for chills and fever.  ?HENT:  Negative for ear pain and sore throat.   ?Eyes:  Negative for pain and visual disturbance.  ?Respiratory:  Positive for chest tightness and shortness of breath. Negative for cough.   ?Cardiovascular:  Positive for chest pain. Negative for palpitations.  ?Gastrointestinal:  Negative for abdominal pain and vomiting.  ?Genitourinary:  Negative for dysuria and hematuria.  ?Musculoskeletal:  Negative for arthralgias and back pain.  ?Skin:  Negative for color change and rash.  ?Neurological:  Negative for seizures and syncope.  ?All other systems reviewed and are negative. ? ?Physical Exam ?Updated Vital Signs ?BP 101/64   Pulse (!) 122   Temp 97.9 ?F (36.6 ?C) (Rectal)   Resp (!) 26   Ht '5\' 8"'$  (1.727 m)   Wt 94.8 kg   SpO2 94%   BMI 31.78 kg/m?  ?Physical Exam ?Vitals and nursing note reviewed.  ?Constitutional:   ?   General: He is not in acute distress. ?   Appearance: He is well-developed.  ?HENT:  ?   Head: Normocephalic and atraumatic.  ?Eyes:  ?   Conjunctiva/sclera: Conjunctivae normal.  ?Cardiovascular:  ?   Rate and Rhythm: Regular rhythm. Tachycardia present.  ?   Heart sounds: No murmur heard. ?Pulmonary:  ?   Effort: No respiratory distress.  ?   Comments: Mild tachypnea, speaks in full sentences ?Abdominal:  ?   Palpations: Abdomen is soft.  ?   Tenderness: There is no abdominal tenderness.  ?Musculoskeletal:     ?   General: No swelling.  ?   Cervical back: Neck supple.  ?Skin: ?   General: Skin is warm and dry.  ?   Capillary Refill: Capillary refill takes less than 2 seconds.  ?Neurological:  ?   Mental Status: He is alert.  ?Psychiatric:     ?   Mood and Affect: Mood normal.  ? ? ?ED Results / Procedures / Treatments   ?Labs ?(all labs ordered are listed, but only abnormal results are displayed) ?Labs Reviewed  ?COMPREHENSIVE METABOLIC PANEL - Abnormal; Notable for the following components:  ?    Result Value  ? Potassium 5.4 (*)   ? Glucose, Bld  350 (*)   ? Creatinine, Ser 1.63 (*)   ? Total Protein 6.4 (*)   ? AST 42 (*)   ? GFR, Estimated 44 (*)   ? All other components within normal limits  ?LACTIC ACID, PLASMA - Abnormal; Notable for the following components:  ? Lactic Acid, Venous 4.9 (*)   ? All other components within normal limits  ?CBG MONITORING, ED - Abnormal; Notable for the following components:  ? Glucose-Capillary 343 (*)   ?  All other components within normal limits  ?RESP PANEL BY RT-PCR (FLU A&B, COVID) [id number]  ?CULTURE, BLOOD (ROUTINE X 2)  ?CULTURE, BLOOD (ROUTINE X 2)  ?LIPASE, BLOOD  ?CBC WITH DIFFERENTIAL/PLATELET  ?URINALYSIS, ROUTINE W REFLEX MICROSCOPIC  ?BRAIN NATRIURETIC PEPTIDE  ?LACTIC ACID, PLASMA  ?CBC WITH DIFFERENTIAL/PLATELET  ?HEPARIN LEVEL (UNFRACTIONATED)  ?PROTIME-INR  ?TROPONIN I (HIGH SENSITIVITY)  ?TROPONIN I (HIGH SENSITIVITY)  ? ? ?EKG ?EKG Interpretation ? ?Date/Time:  Friday July 21 2021 17:29:37 EDT ?Ventricular Rate:  128 ?PR Interval:  145 ?QRS Duration: 94 ?QT Interval:  277 ?QTC Calculation: 405 ?R Axis:   -25 ?Text Interpretation: Sinus tachycardia Abnormal R-wave progression, early transition LVH with secondary repolarization abnormality Confirmed by Madalyn Rob [phone]) on 07/21/2021 7:00:05 PM ? ?Radiology ?CT Head Wo Contrast ? ?Result Date: 07/21/2021 ?CLINICAL DATA:  Altered mental status EXAM: CT HEAD WITHOUT CONTRAST TECHNIQUE: Contiguous axial images were obtained from the base of the skull through the vertex without intravenous contrast. RADIATION DOSE REDUCTION: This exam was performed according to the departmental dose-optimization program which includes automated exposure control, adjustment of the mA and/or kV according to patient size and/or use of iterative reconstruction technique. COMPARISON:  02/22/2020 FINDINGS: Brain: No acute intracranial findings are seen. There is prominence of third and both lateral ventricles with no significant interval change. Cortical sulci are prominent.  Vascular: Unremarkable. Skull: No fracture is seen in the calvarium. Sinuses/Orbits: There is mucosal thickening in the ethmoid sinus. Other: None IMPRESSION: No acute intracranial findings are seen in noncontrast CT br

## 2021-07-21 NOTE — ED Triage Notes (Signed)
Pt states he woke this am with body aches BS 66-wife states pt had episode ~12pm and at ~2pm where "his arms dropped and he just stared-he put his hand on his head"-pt with "twisted mouth" ~3pm-pt NAD-to triage in w/c--c/o pain to abd and states feels weak-also c/o chest pain earlier ?

## 2021-07-21 NOTE — ED Notes (Signed)
Desat in CT SpO2 88-90, placed on 6lpm while in scanner.  On 4LPM  at current SpO2 95. ?

## 2021-07-22 ENCOUNTER — Encounter (HOSPITAL_COMMUNITY): Payer: Self-pay | Admitting: Internal Medicine

## 2021-07-22 ENCOUNTER — Encounter (HOSPITAL_COMMUNITY)
Admission: EM | Disposition: A | Discharge status: Home Health | Payer: Self-pay | Source: Home / Self Care | Attending: Internal Medicine

## 2021-07-22 ENCOUNTER — Inpatient Hospital Stay (HOSPITAL_COMMUNITY): Payer: Medicare Other

## 2021-07-22 ENCOUNTER — Inpatient Hospital Stay (HOSPITAL_COMMUNITY): Payer: Medicare Other | Admitting: Anesthesiology

## 2021-07-22 DIAGNOSIS — F419 Anxiety disorder, unspecified: Secondary | ICD-10-CM | POA: Diagnosis not present

## 2021-07-22 DIAGNOSIS — I2699 Other pulmonary embolism without acute cor pulmonale: Secondary | ICD-10-CM

## 2021-07-22 DIAGNOSIS — N189 Chronic kidney disease, unspecified: Secondary | ICD-10-CM

## 2021-07-22 DIAGNOSIS — F410 Panic disorder [episodic paroxysmal anxiety] without agoraphobia: Secondary | ICD-10-CM | POA: Diagnosis present

## 2021-07-22 DIAGNOSIS — E872 Acidosis, unspecified: Secondary | ICD-10-CM | POA: Diagnosis not present

## 2021-07-22 DIAGNOSIS — I97638 Postprocedural hematoma of a circulatory system organ or structure following other circulatory system procedure: Secondary | ICD-10-CM | POA: Diagnosis not present

## 2021-07-22 DIAGNOSIS — I10 Essential (primary) hypertension: Secondary | ICD-10-CM

## 2021-07-22 DIAGNOSIS — D649 Anemia, unspecified: Secondary | ICD-10-CM | POA: Diagnosis present

## 2021-07-22 DIAGNOSIS — I2692 Saddle embolus of pulmonary artery without acute cor pulmonale: Secondary | ICD-10-CM | POA: Diagnosis present

## 2021-07-22 DIAGNOSIS — Z7984 Long term (current) use of oral hypoglycemic drugs: Secondary | ICD-10-CM | POA: Diagnosis not present

## 2021-07-22 DIAGNOSIS — F411 Generalized anxiety disorder: Secondary | ICD-10-CM | POA: Diagnosis present

## 2021-07-22 DIAGNOSIS — I130 Hypertensive heart and chronic kidney disease with heart failure and stage 1 through stage 4 chronic kidney disease, or unspecified chronic kidney disease: Secondary | ICD-10-CM | POA: Diagnosis present

## 2021-07-22 DIAGNOSIS — I129 Hypertensive chronic kidney disease with stage 1 through stage 4 chronic kidney disease, or unspecified chronic kidney disease: Secondary | ICD-10-CM | POA: Diagnosis not present

## 2021-07-22 DIAGNOSIS — R578 Other shock: Secondary | ICD-10-CM | POA: Diagnosis present

## 2021-07-22 DIAGNOSIS — I214 Non-ST elevation (NSTEMI) myocardial infarction: Secondary | ICD-10-CM | POA: Diagnosis present

## 2021-07-22 DIAGNOSIS — J9601 Acute respiratory failure with hypoxia: Secondary | ICD-10-CM

## 2021-07-22 DIAGNOSIS — E119 Type 2 diabetes mellitus without complications: Secondary | ICD-10-CM | POA: Diagnosis not present

## 2021-07-22 DIAGNOSIS — Z20822 Contact with and (suspected) exposure to covid-19: Secondary | ICD-10-CM | POA: Diagnosis present

## 2021-07-22 DIAGNOSIS — R778 Other specified abnormalities of plasma proteins: Secondary | ICD-10-CM | POA: Diagnosis present

## 2021-07-22 DIAGNOSIS — E1165 Type 2 diabetes mellitus with hyperglycemia: Secondary | ICD-10-CM | POA: Diagnosis not present

## 2021-07-22 DIAGNOSIS — N179 Acute kidney failure, unspecified: Secondary | ICD-10-CM | POA: Diagnosis present

## 2021-07-22 DIAGNOSIS — I2609 Other pulmonary embolism with acute cor pulmonale: Secondary | ICD-10-CM | POA: Diagnosis not present

## 2021-07-22 DIAGNOSIS — R7989 Other specified abnormal findings of blood chemistry: Secondary | ICD-10-CM | POA: Diagnosis present

## 2021-07-22 DIAGNOSIS — I82441 Acute embolism and thrombosis of right tibial vein: Secondary | ICD-10-CM | POA: Diagnosis present

## 2021-07-22 DIAGNOSIS — M069 Rheumatoid arthritis, unspecified: Secondary | ICD-10-CM | POA: Diagnosis present

## 2021-07-22 DIAGNOSIS — R1032 Left lower quadrant pain: Secondary | ICD-10-CM | POA: Diagnosis not present

## 2021-07-22 DIAGNOSIS — I82431 Acute embolism and thrombosis of right popliteal vein: Secondary | ICD-10-CM | POA: Diagnosis present

## 2021-07-22 DIAGNOSIS — I5041 Acute combined systolic (congestive) and diastolic (congestive) heart failure: Secondary | ICD-10-CM | POA: Diagnosis not present

## 2021-07-22 DIAGNOSIS — N1831 Chronic kidney disease, stage 3a: Secondary | ICD-10-CM | POA: Diagnosis present

## 2021-07-22 DIAGNOSIS — I5021 Acute systolic (congestive) heart failure: Secondary | ICD-10-CM | POA: Diagnosis not present

## 2021-07-22 DIAGNOSIS — E669 Obesity, unspecified: Secondary | ICD-10-CM | POA: Diagnosis present

## 2021-07-22 DIAGNOSIS — I2602 Saddle embolus of pulmonary artery with acute cor pulmonale: Secondary | ICD-10-CM | POA: Diagnosis present

## 2021-07-22 DIAGNOSIS — E11649 Type 2 diabetes mellitus with hypoglycemia without coma: Secondary | ICD-10-CM | POA: Diagnosis not present

## 2021-07-22 DIAGNOSIS — I959 Hypotension, unspecified: Secondary | ICD-10-CM | POA: Diagnosis not present

## 2021-07-22 DIAGNOSIS — Z794 Long term (current) use of insulin: Secondary | ICD-10-CM | POA: Diagnosis not present

## 2021-07-22 DIAGNOSIS — D696 Thrombocytopenia, unspecified: Secondary | ICD-10-CM | POA: Diagnosis not present

## 2021-07-22 DIAGNOSIS — R569 Unspecified convulsions: Secondary | ICD-10-CM | POA: Diagnosis not present

## 2021-07-22 DIAGNOSIS — I82401 Acute embolism and thrombosis of unspecified deep veins of right lower extremity: Secondary | ICD-10-CM | POA: Diagnosis not present

## 2021-07-22 DIAGNOSIS — I82409 Acute embolism and thrombosis of unspecified deep veins of unspecified lower extremity: Secondary | ICD-10-CM

## 2021-07-22 DIAGNOSIS — E1122 Type 2 diabetes mellitus with diabetic chronic kidney disease: Secondary | ICD-10-CM | POA: Diagnosis present

## 2021-07-22 DIAGNOSIS — I517 Cardiomegaly: Secondary | ICD-10-CM | POA: Diagnosis not present

## 2021-07-22 DIAGNOSIS — E039 Hypothyroidism, unspecified: Secondary | ICD-10-CM | POA: Diagnosis present

## 2021-07-22 DIAGNOSIS — E1140 Type 2 diabetes mellitus with diabetic neuropathy, unspecified: Secondary | ICD-10-CM | POA: Diagnosis present

## 2021-07-22 HISTORY — PX: IR THROMBECT VENO MECH MOD SED: IMG2300

## 2021-07-22 HISTORY — PX: IR ANGIOGRAM PULMONARY BILATERAL SELECTIVE: IMG664

## 2021-07-22 HISTORY — PX: RADIOLOGY WITH ANESTHESIA: SHX6223

## 2021-07-22 HISTORY — PX: IR US GUIDE VASC ACCESS LEFT: IMG2389

## 2021-07-22 LAB — POCT ACTIVATED CLOTTING TIME
Activated Clotting Time: 203 seconds
Activated Clotting Time: 233 seconds
Activated Clotting Time: 227 seconds
Activated Clotting Time: 239 seconds

## 2021-07-22 LAB — CBC
HCT: 41.2 % (ref 39.0–52.0)
Hemoglobin: 13.3 g/dL (ref 13.0–17.0)
MCH: 31.8 pg (ref 26.0–34.0)
MCHC: 32.3 g/dL (ref 30.0–36.0)
MCV: 98.6 fL (ref 80.0–100.0)
Platelets: 129 10*3/uL — ABNORMAL LOW (ref 150–400)
RBC: 4.18 MIL/uL — ABNORMAL LOW (ref 4.22–5.81)
RDW: 14.3 % (ref 11.5–15.5)
WBC: 8 10*3/uL (ref 4.0–10.5)
nRBC: 0.3 % — ABNORMAL HIGH (ref 0.0–0.2)

## 2021-07-22 LAB — GLUCOSE, CAPILLARY
Glucose-Capillary: 63 mg/dL — ABNORMAL LOW (ref 70–99)
Glucose-Capillary: 184 mg/dL — ABNORMAL HIGH (ref 70–99)
Glucose-Capillary: 212 mg/dL — ABNORMAL HIGH (ref 70–99)
Glucose-Capillary: 146 mg/dL — ABNORMAL HIGH (ref 70–99)
Glucose-Capillary: 186 mg/dL — ABNORMAL HIGH (ref 70–99)

## 2021-07-22 LAB — TROPONIN I (HIGH SENSITIVITY)
Troponin I (High Sensitivity): 852 ng/L (ref <18)
Troponin I (High Sensitivity): 1507 ng/L (ref <18)
Troponin I (High Sensitivity): 1450 ng/L (ref <18)

## 2021-07-22 LAB — COMPREHENSIVE METABOLIC PANEL
ALT: 25 U/L (ref 0–44)
AST: 32 U/L (ref 15–41)
Albumin: 3.1 g/dL — ABNORMAL LOW (ref 3.5–5.0)
Alkaline Phosphatase: 45 U/L (ref 38–126)
Anion gap: 7 (ref 5–15)
BUN: 13 mg/dL (ref 8–23)
CO2: 26 mmol/L (ref 22–32)
Calcium: 9.8 mg/dL (ref 8.9–10.3)
Chloride: 107 mmol/L (ref 98–111)
Creatinine, Ser: 1.35 mg/dL — ABNORMAL HIGH (ref 0.61–1.24)
GFR, Estimated: 56 mL/min — ABNORMAL LOW (ref >60)
Glucose, Bld: 131 mg/dL — ABNORMAL HIGH (ref 70–99)
Potassium: 5.1 mmol/L (ref 3.5–5.1)
Sodium: 140 mmol/L (ref 135–145)
Total Bilirubin: 0.8 mg/dL (ref 0.3–1.2)
Total Protein: 5.3 g/dL — ABNORMAL LOW (ref 6.5–8.1)

## 2021-07-22 LAB — CBG MONITORING, ED: Glucose-Capillary: 162 mg/dL — ABNORMAL HIGH (ref 70–99)

## 2021-07-22 LAB — HEPARIN LEVEL (UNFRACTIONATED)
Heparin Unfractionated: 1.05 IU/mL — ABNORMAL HIGH (ref 0.30–0.70)
Heparin Unfractionated: >1.1 IU/mL — ABNORMAL HIGH (ref 0.30–0.70)

## 2021-07-22 LAB — MRSA NEXT GEN BY PCR, NASAL: MRSA by PCR Next Gen: NOT DETECTED

## 2021-07-22 SURGERY — IR WITH ANESTHESIA
Anesthesia: Monitor Anesthesia Care | Laterality: Right

## 2021-07-22 MED ORDER — DEXMEDETOMIDINE HCL IN NACL 400 MCG/100ML IV SOLN
INTRAVENOUS | Status: AC
  Filled 2021-07-22: qty 100

## 2021-07-22 MED ORDER — ONDANSETRON HCL 4 MG/2ML IJ SOLN
INTRAMUSCULAR | Status: DC | PRN
Start: 2021-07-22 — End: 2021-07-22
  Administered 2021-07-22: 4 mg via INTRAVENOUS

## 2021-07-22 MED ORDER — PANTOPRAZOLE SODIUM 40 MG PO TBEC
40.0000 mg | DELAYED_RELEASE_TABLET | Freq: Every day | ORAL | Status: DC
Start: 2021-07-22 — End: 2021-07-27
  Administered 2021-07-22 – 2021-07-27 (×5): 40 mg via ORAL
  Filled 2021-07-22 (×5): qty 1

## 2021-07-22 MED ORDER — NOREPINEPHRINE 4 MG/250ML-% IV SOLN
INTRAVENOUS | Status: AC
  Filled 2021-07-22: qty 250

## 2021-07-22 MED ORDER — LIDOCAINE HCL 1 % IJ SOLN
INTRAMUSCULAR | Status: AC
  Filled 2021-07-22: qty 20

## 2021-07-22 MED ORDER — INSULIN ASPART 100 UNIT/ML IJ SOLN
0.0000 [IU] | Freq: Three times a day (TID) | INTRAMUSCULAR | Status: DC
  Administered 2021-07-22: 5 [IU] via SUBCUTANEOUS
  Administered 2021-07-22 – 2021-07-23 (×2): 2 [IU] via SUBCUTANEOUS

## 2021-07-22 MED ORDER — DEXMEDETOMIDINE HCL IN NACL 400 MCG/100ML IV SOLN
0.1000 ug/kg/h | INTRAVENOUS | Status: DC
  Administered 2021-07-23: 0.8 ug/kg/h via INTRAVENOUS
  Filled 2021-07-22: qty 100

## 2021-07-22 MED ORDER — KETAMINE HCL 50 MG/5ML IJ SOSY
PREFILLED_SYRINGE | INTRAMUSCULAR | Status: AC
  Filled 2021-07-22: qty 10

## 2021-07-22 MED ORDER — LORAZEPAM 2 MG/ML IJ SOLN
0.5000 mg | Freq: Once | INTRAMUSCULAR | Status: AC
Start: 2021-07-22 — End: 2021-07-22
  Administered 2021-07-22: 0.5 mg via INTRAVENOUS
  Filled 2021-07-22: qty 1

## 2021-07-22 MED ORDER — PHENYLEPHRINE HCL-NACL 20-0.9 MG/250ML-% IV SOLN: 25.0000 ug/min | INTRAVENOUS | Status: DC

## 2021-07-22 MED ORDER — POLYVINYL ALCOHOL 1.4 % OP SOLN: 1.0000 [drp] | OPHTHALMIC | Status: DC | PRN

## 2021-07-22 MED ORDER — MIDAZOLAM HCL 2 MG/2ML IJ SOLN
INTRAMUSCULAR | Status: DC | PRN
Start: 2021-07-22 — End: 2021-07-22
  Administered 2021-07-22 (×3): 2 mg via INTRAVENOUS

## 2021-07-22 MED ORDER — LEVOTHYROXINE SODIUM 88 MCG PO TABS
88.0000 ug | ORAL_TABLET | Freq: Every day | ORAL | Status: DC
  Administered 2021-07-22 – 2021-07-27 (×5): 88 ug via ORAL
  Filled 2021-07-22 (×7): qty 1

## 2021-07-22 MED ORDER — TRAMADOL HCL 50 MG PO TABS
100.0000 mg | ORAL_TABLET | Freq: Three times a day (TID) | ORAL | Status: DC | PRN
  Administered 2021-07-24: 100 mg via ORAL
  Filled 2021-07-22: qty 2

## 2021-07-22 MED ORDER — BACLOFEN 20 MG PO TABS
20.0000 mg | ORAL_TABLET | Freq: Two times a day (BID) | ORAL | Status: DC
  Administered 2021-07-22 – 2021-07-27 (×10): 20 mg via ORAL
  Filled 2021-07-22 (×3): qty 2
  Filled 2021-07-22: qty 1
  Filled 2021-07-22: qty 2
  Filled 2021-07-22 (×4): qty 1
  Filled 2021-07-22: qty 2
  Filled 2021-07-22: qty 1

## 2021-07-22 MED ORDER — CHLORHEXIDINE GLUCONATE 0.12 % MT SOLN
OROMUCOSAL | Status: AC
  Administered 2021-07-22: 15 mL via OROMUCOSAL
  Filled 2021-07-22: qty 15

## 2021-07-22 MED ORDER — SODIUM CHLORIDE 0.9% FLUSH
3.0000 mL | Freq: Two times a day (BID) | INTRAVENOUS | Status: DC
  Administered 2021-07-22 – 2021-07-27 (×9): 3 mL via INTRAVENOUS

## 2021-07-22 MED ORDER — DEXMEDETOMIDINE (PRECEDEX) IN NS 20 MCG/5ML (4 MCG/ML) IV SYRINGE
PREFILLED_SYRINGE | INTRAVENOUS | Status: DC | PRN
  Administered 2021-07-22: 20 ug via INTRAVENOUS
  Administered 2021-07-22: 8 ug via INTRAVENOUS
  Administered 2021-07-22 (×2): 12 ug via INTRAVENOUS
  Administered 2021-07-22: 8 ug via INTRAVENOUS

## 2021-07-22 MED ORDER — LOSARTAN POTASSIUM 25 MG PO TABS
25.0000 mg | ORAL_TABLET | Freq: Every day | ORAL | Status: DC
  Administered 2021-07-22: 25 mg via ORAL
  Filled 2021-07-22: qty 1

## 2021-07-22 MED ORDER — ACETAMINOPHEN 325 MG PO TABS
650.0000 mg | ORAL_TABLET | Freq: Four times a day (QID) | ORAL | Status: DC | PRN
  Administered 2021-07-23 – 2021-07-24 (×3): 650 mg via ORAL
  Filled 2021-07-22 (×4): qty 2

## 2021-07-22 MED ORDER — PHENYLEPHRINE 40 MCG/ML (10ML) SYRINGE FOR IV PUSH (FOR BLOOD PRESSURE SUPPORT)
PREFILLED_SYRINGE | INTRAVENOUS | Status: DC | PRN
  Administered 2021-07-22 (×3): 80 ug via INTRAVENOUS

## 2021-07-22 MED ORDER — EPINEPHRINE HCL 5 MG/250ML IV SOLN IN NS
0.5000 ug/min | INTRAVENOUS | Status: DC
  Filled 2021-07-22: qty 250

## 2021-07-22 MED ORDER — INSULIN GLARGINE-YFGN 100 UNIT/ML ~~LOC~~ SOLN
20.0000 [IU] | Freq: Two times a day (BID) | SUBCUTANEOUS | Status: DC
  Administered 2021-07-22: 20 [IU] via SUBCUTANEOUS
  Filled 2021-07-22 (×3): qty 0.2

## 2021-07-22 MED ORDER — ACETAMINOPHEN 650 MG RE SUPP
650.0000 mg | Freq: Four times a day (QID) | RECTAL | Status: DC | PRN
Start: 2021-07-21 — End: 2021-07-27

## 2021-07-22 MED ORDER — LORAZEPAM 1 MG PO TABS
1.0000 mg | ORAL_TABLET | Freq: Three times a day (TID) | ORAL | Status: DC | PRN
  Administered 2021-07-22: 1 mg via ORAL
  Filled 2021-07-22: qty 1

## 2021-07-22 MED ORDER — INSULIN ASPART 100 UNIT/ML IJ SOLN: 0.0000 [IU] | INTRAMUSCULAR | Status: DC | PRN

## 2021-07-22 MED ORDER — DEXMEDETOMIDINE (PRECEDEX) IN NS 20 MCG/5ML (4 MCG/ML) IV SYRINGE
PREFILLED_SYRINGE | INTRAVENOUS | Status: AC
  Filled 2021-07-22: qty 10

## 2021-07-22 MED ORDER — PHENYLEPHRINE HCL-NACL 20-0.9 MG/250ML-% IV SOLN
INTRAVENOUS | Status: DC | PRN
  Administered 2021-07-22: 25 ug/min via INTRAVENOUS

## 2021-07-22 MED ORDER — SODIUM CHLORIDE 0.9 % IV SOLN: 250.0000 mL | INTRAVENOUS | Status: DC

## 2021-07-22 MED ORDER — LACTATED RINGERS IV SOLN: INTRAVENOUS | Status: DC

## 2021-07-22 MED ORDER — CHLORHEXIDINE GLUCONATE 0.12 % MT SOLN: 15.0000 mL | Freq: Once | OROMUCOSAL | Status: AC

## 2021-07-22 MED ORDER — VASOPRESSIN 20 UNIT/ML IV SOLN
INTRAVENOUS | Status: AC
  Filled 2021-07-22: qty 1

## 2021-07-22 MED ORDER — HEPARIN SODIUM (PORCINE) 1000 UNIT/ML IJ SOLN
INTRAMUSCULAR | Status: DC | PRN
  Administered 2021-07-22: 2000 [IU] via INTRAVENOUS

## 2021-07-22 MED ORDER — LORAZEPAM 1 MG PO TABS
1.0000 mg | ORAL_TABLET | Freq: Four times a day (QID) | ORAL | Status: DC | PRN
  Administered 2021-07-22 – 2021-07-24 (×3): 1 mg via ORAL
  Filled 2021-07-22 (×3): qty 1

## 2021-07-22 MED ORDER — INSULIN GLARGINE-YFGN 100 UNIT/ML ~~LOC~~ SOLN
15.0000 [IU] | Freq: Two times a day (BID) | SUBCUTANEOUS | Status: DC
  Administered 2021-07-22 – 2021-07-23 (×2): 15 [IU] via SUBCUTANEOUS
  Filled 2021-07-22 (×6): qty 0.15

## 2021-07-22 MED ORDER — CHLORHEXIDINE GLUCONATE CLOTH 2 % EX PADS
6.0000 | MEDICATED_PAD | Freq: Every day | CUTANEOUS | Status: DC
  Administered 2021-07-22 – 2021-07-26 (×5): 6 via TOPICAL

## 2021-07-22 MED ORDER — DEXMEDETOMIDINE HCL IN NACL 400 MCG/100ML IV SOLN
INTRAVENOUS | Status: DC | PRN
  Administered 2021-07-22: .4 ug/kg/h via INTRAVENOUS

## 2021-07-22 MED ORDER — IOHEXOL 300 MG/ML  SOLN
100.0000 mL | Freq: Once | INTRAMUSCULAR | Status: AC | PRN
  Administered 2021-07-22: 60 mL via INTRAVENOUS

## 2021-07-22 MED ORDER — PREDNISONE 5 MG PO TABS
15.0000 mg | ORAL_TABLET | Freq: Every day | ORAL | Status: DC
  Administered 2021-07-22 – 2021-07-27 (×5): 15 mg via ORAL
  Filled 2021-07-22: qty 2
  Filled 2021-07-22: qty 3
  Filled 2021-07-22 (×3): qty 2
  Filled 2021-07-22: qty 1.5
  Filled 2021-07-22: qty 1
  Filled 2021-07-22: qty 2
  Filled 2021-07-22: qty 3

## 2021-07-22 MED ORDER — SODIUM CHLORIDE 0.9 % IV BOLUS
500.0000 mL | Freq: Once | INTRAVENOUS | Status: AC
Start: 2021-07-22 — End: 2021-07-22
  Administered 2021-07-22: 500 mL via INTRAVENOUS

## 2021-07-22 NOTE — Anesthesia Procedure Notes (Signed)
Procedure Name: [place] ?Date/Time: 07/22/2021 6:21 PM ?Performed by: Trinna Post., CRNA ?Pre-anesthesia Checklist: Patient identified, Emergency Drugs available, Suction available, Patient being monitored and Timeout performed ?Patient Re-evaluated:Patient Re-evaluated prior to induction ?Oxygen Delivery Method: Nasal cannula ?Preoxygenation: Pre-oxygenation with 100% oxygen ?Induction Type: IV induction ?Laryngoscope Size: Mac ?Placement Confirmation: positive ETCO2 ? ? ? ? ?

## 2021-07-22 NOTE — Progress Notes (Signed)
Patient restless, confused, trying to get out of bed, pulling at gown, "needs to pee" "needs to get up." MD Dr. Lamonte Sakai notified with orders for PO Ativan 1 mg ? ?Wife at bedside attempting to console patient and patient becomes agitated.  ? ?Patient continues to attempt to get out of bed "needs to pee." Patient voided some but had post void residual of 355 ml. MD Dr. Lamonte Sakai paged. Received order for foley catheter.  ? ?[name] ? ?

## 2021-07-22 NOTE — Procedures (Signed)
Interventional Radiology Procedure Note ? ?Procedure: Bilateral pulmonary angiogram and thrombectomy ? ?Indication: Submassive PE ? ?Findings: Please refer to procedural dictation for full description. ? ?Complications: None ? ?EBL: < 100 mL ? ?Miachel Roux, MD ?[phone] ? ? ?

## 2021-07-22 NOTE — Progress Notes (Signed)
Inpatient Diabetes Program Recommendations ? ?AACE/ADA: New Consensus Statement on Inpatient Glycemic Control (2015) ? ?Target Ranges:  Prepandial:   less than 140 mg/dL ?     Peak postprandial:   less than 180 mg/dL (1-2 hours) ?     Critically ill patients:  140 - 180 mg/dL  ? ?Lab Results  ?Component Value Date  ? GLUCAP 63 (L) 07/22/2021  ? HGBA1C 9.2 08/27/2019  ? ? ?Review of Glycemic Control ? Latest Reference Range & Units 07/21/21 17:19 07/22/21 01:08 07/22/21 06:24  ?Glucose-Capillary 70 - 99 mg/dL 343 (H) 162 (H) 63 (L)  ?(H): Data is abnormally high ?(L): Data is abnormally low ?Diabetes history: Type 2 dm ?Outpatient Diabetes medications: Lantus 46-56 units QHS, Glipizide 10 mg BID, Metformin 1000 mg BID, Jardiance 10 mg QD (NT) ?Current orders for Inpatient glycemic control: Semglee 15 units BID, Novolog 0-15 units TID ?Prednisone 15 mg QD ? ?Inpatient Diabetes Program Recommendations:   ? ?Noted consult. Previous A1C 8.5% from 05/2021. Noted hypoglycemia this AM of 63 mg/dL and subsequent insulin adjustments. In agreement.  ?If post prandials >180 mg/dL could consider adding Novolog 3 units TID (assuming patient is consuming >50% of meals).  ? ?Thanks, ?Bronson Curb, MSN, RNC-OB ?Diabetes Coordinator ?[phone] (8a-5p) ? ? ? ? ? ?

## 2021-07-22 NOTE — Progress Notes (Signed)
Pt CBG rechecked post hypoglycemic event , CBG machine is not transmitting to computer , blood sugar reading is 183.  ?

## 2021-07-22 NOTE — Progress Notes (Signed)
? ? ? [facility] ?                                                                            ? ? ?[name], is a 71 y.o. male, DOB - [birth year], TRR:[id number] ?Admit date - 07/21/2021    ?Outpatient Primary MD for the patient is [name], DO ? ?LOS - 0  days ? ? ? ?Brief summary  ? ?Patient is a 71 year old male with history of diabetes mellitus, hypertension, anemia, hypothyroidism, BPH, neuropathy, PTSD, autoimmune myositis presented with weakness, pain and confusion.  Patient reported 2 episodes of confusion on the day of admission, brief hypoglycemia.  Also reported generally weak for a while with associated chest pain, shortness of breath, abdominal pain, right lower extremity pain and swelling. ?CT angiogram chest showed saddle PE with right heart strain. ?Venous Dopplers showed occlusive right popliteal DVT. ?Patient was started on IV heparin drip and admitted for further work-up. ? ? ?Assessment & Plan  ? ? ?Assessment and Plan: ?* Acute pulmonary embolism ([place]) ?- Presented with shortness of breath, chest pain, abdominal pain, weakness.  Unclear etiology of PE, no provoking factors identified except that patient has been on testosterone injections for last 10 years (increased short-term risk for venous thromboembolism). ?-CTA chest showed saddle pulmonary embolus with right heart strain, RV to LV ratio of 1.35 consistent with at least submassive PE. ?-Venous Dopplers positive for occlusive DVT within the right popliteal vein, nonocclusive thrombus in the right posterior tibial vein. ?-Follow 2D echo ?-Continue IV heparin drip, PCCM consulted will follow recommendations. ? ?Acute respiratory failure with hypoxia ([place]) ?- Likely due to saddle PE.   ?- Currently on 5 L O2 via nasal cannula, sats 96%, wean as tolerated. ? ?Elevated troponin ?- Troponins elevated 1450<- 1507 <-852.  EKG showed sinus tachycardia with repolarization changes. ?-Likely due to demand ischemia from submassive saddle  PE.  Follow 2D echocardiogram, will consult cardiology if significant drop in EF. ? ?Acute DVT (deep venous thrombosis) ([place]) ?- Continue anticoagulation with IV heparin ? ?Acute kidney injury superimposed on CKD stage3a  ?- Baseline creatinine 1.3-1.4 ?-Presented with creatinine of 1.6 likely due to hypoperfusion, acute PE ?-Hold losartan, continue IV fluid hydration ? ?DM2 (diabetes mellitus, type 2) ([place]) ?- Patient noted to have hypoglycemia, hold glipizide, metformin ?-Patient takes Lantus 42 to 44 units twice daily ?-Received 20 units Semglee overnight, CBG low 63 this a.m. ?- will reduce Semglee to 15 units twice daily, continue sliding scale insulin  ? ? ? ?Anxiety ?- Patient has a history of PTSD, had anxiety/panic attack this a.m., placed on Ativan 0.5 mg IV x1 then as needed ? ?GERD (gastroesophageal reflux disease) ?- Continue PPI ? ?Hypothyroidism ?- Continue Synthroid ? ?HTN (hypertension) ?- BP currently is soft, hold losartan, continue IV fluid hydration ? ?Lactic acidosis ?- Lactic acid 4.1, continue IV fluid hydration, repeat ? ?Obesity ?Estimated body mass index is 31.78 kg/m? as calculated from the following: ?  Height as of this encounter: '5\' 8"'$  (1.727 m). ?  Weight as of this encounter: 94.8 kg. ? ?Code Status: Full CODE STATUS ?DVT Prophylaxis:    IV heparin drip ? ?Level of Care: Level of  care: Progressive ?Family Communication: Updated patient's wife at the bedside ? ?Disposition Plan:     Remains inpatient appropriate: Saddle pulmonary embolism, hemodynamically unstable ? ?Procedures:  ?CT angiogram chest, abdomen pelvis ?Venous Dopplers ? ?Consultants:   ?Pulmonary critical care ? ?Antimicrobials:  ? ?Medications ? ? baclofen  20 mg Oral BID  ? insulin aspart  0-15 Units Subcutaneous TID WC  ? insulin glargine-yfgn  15 Units Subcutaneous BID  ? levothyroxine  88 mcg Oral Daily  ? pantoprazole  40 mg Oral Daily  ? predniSONE  15 mg Oral Q breakfast  ? sodium chloride flush  3 mL Intravenous  Q12H  ? ? ? ?Subjective:  ? ?[name] was seen and examined today.  Feeling anxious, still has chest pressure, shortness of breath.  Currently no abdominal pain nausea or vomiting.  No fevers.  On 5 L O2 via [place] ? ?Objective:  ? ?Vitals:  ? 07/22/21 0504 07/22/21 0600 07/22/21 0700 07/22/21 0754  ?BP: (!) 144/86 124/79 (!) 121/102 125/87  ?Pulse: (!) 114 (!) 109 (!) 114 (!) 117  ?Resp: (!) 25 (!) '25 18 20  '$ ?Temp: 99 ?F (37.2 ?C) 98.7 ?F (37.1 ?C) 98.7 ?F (37.1 ?C) 98.7 ?F (37.1 ?C)  ?TempSrc: Oral Oral Oral Oral  ?SpO2: 91% 94% 95% 96%  ?Weight:      ?Height:      ? ? ?Intake/Output Summary (Last 24 hours) at 07/22/2021 1008 ?Last data filed at 07/22/2021 0901 ?Gross per 24 hour  ?Intake 2240.67 ml  ?Output 2200 ml  ?Net 40.67 ml  ? ?Filed Weights  ? 07/21/21 1740 07/21/21 1937  ?Weight: 94.8 kg 94.8 kg  ? ? ? ?Exam ?General: Alert and oriented x 3, anxious ?Cardiovascular: S1 S2 auscultated, no murmurs, RRR ?Respiratory: Clear to auscultation bilaterally ?Gastrointestinal: Soft, nontender, nondistended, + bowel sounds ?Ext: 1-2+ pitting edema RLE, no edema LLE ?Neuro: no new deficits ?Psych: anxious ? ? ?Data Reviewed:  I have personally reviewed following labs  ? ? ?CBC ?Lab Results  ?Component Value Date  ? WBC 8.0 07/22/2021  ? RBC 4.18 (L) 07/22/2021  ? HGB 13.3 07/22/2021  ? HCT 41.2 07/22/2021  ? MCV 98.6 07/22/2021  ? MCH 31.8 07/22/2021  ? PLT 129 (L) 07/22/2021  ? MCHC 32.3 07/22/2021  ? RDW 14.3 07/22/2021  ? LYMPHSABS 0.6 (L) 07/21/2021  ? MONOABS 0.8 07/21/2021  ? EOSABS 0.0 07/21/2021  ? BASOSABS 0.0 07/21/2021  ? ? ? ?Last metabolic panel ?Lab Results  ?Component Value Date  ? NA 140 07/22/2021  ? K 5.1 07/22/2021  ? CL 107 07/22/2021  ? CO2 26 07/22/2021  ? BUN 13 07/22/2021  ? CREATININE 1.35 (H) 07/22/2021  ? GLUCOSE 131 (H) 07/22/2021  ? GFRNONAA 56 (L) 07/22/2021  ? GFRAA 62 09/22/2019  ? CALCIUM 9.8 07/22/2021  ? PROT 5.3 (L) 07/22/2021  ? ALBUMIN 3.1 (L) 07/22/2021  ? BILITOT 0.8 07/22/2021  ?  ALKPHOS 45 07/22/2021  ? AST 32 07/22/2021  ? ALT 25 07/22/2021  ? ANIONGAP 7 07/22/2021  ? ? ?CBG (last 3)  ?Recent Labs  ?  07/21/21 ?1719 07/22/21 ?0108 07/22/21 ?0624  ?GLUCAP 343* 162* 63*  ?  ? ? ?Coagulation Profile: ?Recent Labs  ?Lab 07/21/21 ?2032  ?INR 1.1  ? ? ? ?Radiology Studies: I have personally reviewed the imaging studies  ?CT Head Wo Contrast ? ?Result Date: 07/21/2021 ?IMPRESSION: No acute intracranial findings are seen in noncontrast CT brain. Chronic ethmoid sinusitis. Electronically Signed  By: Elmer Picker M.D.   On: 07/21/2021 19:36  ? ?CT Angio Chest PE W and/or Wo Contrast ? ?Result Date: 07/21/2021 ? IMPRESSION: 1. Saddle pulmonary embolus with CT evidence of right heart strain (RV/LV Ratio = 1.35) consistent with at least submassive (intermediate risk) PE. The presence of right heart strain has been associated with an increased risk of morbidity and mortality. Please refer to the "PE Focused" order set in EPIC. 2. No acute abnormality of the abdomen or pelvis. Critical Value/emergent results were called by telephone at the time of interpretation on 07/21/2021 at 7:45 pm to provider [facility] , who verbally acknowledged these results. Aortic Atherosclerosis (ICD10-I70.0). Electronically Signed   By: Ulyses Jarred M.D.   On: 07/21/2021 19:48  ? ?CT ABDOMEN PELVIS W CONTRAST ? ?Result Date: 07/21/2021 ? IMPRESSION: 1. Saddle pulmonary embolus with CT evidence of right heart strain (RV/LV Ratio = 1.35) consistent with at least submassive (intermediate risk) PE. The presence of right heart strain has been associated with an increased risk of morbidity and mortality. Please refer to the "PE Focused" order set in EPIC. 2. No acute abnormality of the abdomen or pelvis. Critical Value/emergent results were called by telephone at the time of interpretation on 07/21/2021 at 7:45 pm to provider [facility] , who verbally acknowledged these results. Aortic Atherosclerosis (ICD10-I70.0).  Electronically Signed   By: Ulyses Jarred M.D.   On: 07/21/2021 19:48  ? ?US Venous Img Lower Unilateral Right ? ?Result Date: 07/21/2021 ? IMPRESSION: 1. Occlusive deep venous thrombus within the ri

## 2021-07-22 NOTE — Anesthesia Postprocedure Evaluation (Signed)
Anesthesia Post Note ? ?Patient: [name] ? ?Procedure(s) Performed: IR WITH ANESTHESIA (Right) ? ?  ? ?Patient location during evaluation: PACU ?Anesthesia Type: MAC ?Level of consciousness: awake and alert ?Pain management: pain level controlled ?Vital Signs Assessment: post-procedure vital signs reviewed and stable ?Respiratory status: spontaneous breathing, nonlabored ventilation, respiratory function stable and patient connected to nasal cannula oxygen ?Cardiovascular status: stable and blood pressure returned to baseline ?Postop Assessment: no apparent nausea or vomiting ?Anesthetic complications: no ? ? ?No notable events documented. ? ?Last Vitals:  ?Vitals:  ? 07/22/21 1600 07/22/21 1700  ?BP: (!) 133/91 (!) 143/88  ?Pulse: (!) 125 (!) 119  ?Resp: (!) 23 14  ?Temp:    ?SpO2: 97% 96%  ?  ?Last Pain:  ?Vitals:  ? 07/22/21 1515  ?TempSrc: Oral  ?PainSc:   ? ? ?  ?  ?  ?  ?  ?  ? ?Suzette Battiest E ? ? ? ? ?

## 2021-07-22 NOTE — Procedures (Signed)
Pre procedure Pressures: 61/20 (35) ? ?Post procedure Pressures: 38/12 (19) ?

## 2021-07-22 NOTE — Progress Notes (Signed)
An USGPIV (ultrasound guided PIV) has been placed for short-term vasopressor infusion. A correctly placed ivWatch must be used when administering Vasopressors. Should this treatment be needed beyond 72 hours, central line access should be obtained.  It will be the responsibility of the bedside nurse to follow best practice to prevent extravasations.   ?

## 2021-07-22 NOTE — Assessment & Plan Note (Addendum)
-   Presented with shortness of breath, chest pain, abdominal pain, weakness.  Unclear etiology of PE, no provoking factors identified except that patient has been on testosterone injections for last 10 years (increased short-term risk for venous thromboembolism). ?-CTA chest showed saddle pulmonary embolus with right heart strain, RV to LV ratio of 1.35 consistent with at least submassive PE. ?-Venous Dopplers positive for occlusive DVT within the right popliteal vein, nonocclusive thrombus in the right posterior tibial vein. ?-Follow 2D echo ?-Continue IV heparin drip, PCCM consulted will follow recommendations. ?

## 2021-07-22 NOTE — Consult Note (Signed)
? ?Chief Complaint: ?Patient was seen in consultation today for pulmonary arteriogram with catheter directed thrombectomy/thrombolysis ? ?Chief Complaint  ?Patient presents with  ? Multiple c/o  ?  ? ?Referring Physician(s): ?Byrum,R ? ?Supervising Physician: Mir, Sharen Heck ? ?Patient Status: [facility] - In-pt ? ?History of Present Illness: ?[name] is a 71 y.o. 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 He was noted to be hypoglycemic with elevated lactic acid and troponins. CT head was neg for any acute findings, CT angio chest revealed - Saddle pulmonary embolus with CT evidence of right heart strain ?(RV/LV Ratio = 1.35) consistent with at least submassive (intermediate risk) PE. No abnormality of abd/pelvis. LE venous doppler revealed: ?1. Occlusive deep venous thrombus within the right popliteal vein. ?Nonocclusive thrombus within the right posterior tibial vein. ?2. No evidence of deep venous thrombosis within the left lower ?Extremity ? ?Pt is currently on IV heparin. Current labs include: WBC 8, hgb 13.3, plts 129k, creat 1.35, GFR 56, PT/INR 14.5/1.1; blood cx neg to date, COVID 19 neg.  ?He is very anxious and tachycardic. Request received from CCM for pulmonary arteriogram with thrombectomy/thrombolytic therapy.  ? ? ?Past Medical History:  ?Diagnosis Date  ? Autoimmune disease ([place])   ? Chronic hiccups   ? Diabetes mellitus without complication ([place])   ? GERD (gastroesophageal reflux disease)   ? Hypothyroidism   ? Joint pain   ? Necrotizing myopathy   ? PTSD (post-traumatic stress disorder)   ? ? ?Past Surgical History:  ?Procedure Laterality Date  ? BIOPSY SHOULDER    ? Left  ? ? ?Allergies: ?Benazepril, Gabapentin, Hydrocodone, and Shellfish  allergy ? ?Medications: ?Prior to Admission medications   ?Medication Sig Start Date End Date Taking? Authorizing Provider  ?acarbose (PRECOSE) 100 MG tablet Take 100 mg by mouth 3 (three) times daily with meals.   Yes [provider]  ?baclofen (LIORESAL) 20 MG tablet Take 20 mg by mouth 2 (two) times daily. 06/22/21  Yes [provider]  ?calcium carbonate (OS-CAL) 600 MG TABS tablet Take 600 mg by mouth 2 (two) times daily with a meal.   Yes [provider]  ?folic acid (FOLVITE) 1 MG tablet Take 1 mg by mouth daily.   Yes [provider]  ?furosemide (LASIX) 20 MG tablet Take 20 mg by mouth daily. 05/25/21  Yes [provider]  ?glipiZIDE (GLUCOTROL XL) 10 MG 24 hr tablet Take 10 mg by mouth 2 (two) times daily.    Yes [provider]  ?hydrOXYzine (ATARAX/VISTARIL) 25 MG tablet TAKE 1 TABLET (25 MG TOTAL) BY MOUTH 3 (THREE) TIMES DAILY AS NEEDED. FOR ITCHING ?Patient taking differently: Take 25 mg by mouth 3 (three) times daily as needed for itching. For itching 12/28/19  Yes [name], DO  ?insulin glargine (LANTUS) 100 UNIT/ML injection Inject 0.46-0.56 mLs (46-56 Units total) into the skin at bedtime. For now take 30 units twice a day and then increase to your usual dose as your oral intake improves. Increase both doses by 2units every day. Check your blood sugars at home three times daily. Call your doctor if sugar level is more than 300 or less than 80. ?Patient taking differently: Inject 42-44 Units into the skin 2 (two) times daily. Check your blood sugars at home three times daily.  Call your doctor if sugar level is more than 300 or less than 80. 10/01/14  Yes Bonnielee Haff, MD  ?Levothyroxine Sodium 88 MCG CAPS Take 88 mcg by mouth daily.   Yes [provider]  ?losartan (COZAAR) 50 MG tablet Take 25 mg by mouth daily. Take 1/2 tablet   Yes [provider]  ?metFORMIN (GLUCOPHAGE) 1000 MG tablet Take 1,000 mg by mouth 2 (two)  times daily. 06/20/21  Yes [provider]  ?methotrexate (RHEUMATREX) 5 MG tablet Take 2 tablets (10 mg total) by mouth once a week. Caution: Chemotherapy. Protect from light. Take on fridays per patient. ? ?OKAY TO RESUME FROM 10/08/14. ?Patient taking differently: Take 30 mg by mouth once a week. Caution: Chemotherapy. Protect from light. Take on fridays per patient. 6 Tabs once a week 10/01/14  Yes Bonnielee Haff, MD  ?nystatin (MYCOSTATIN/NYSTOP) powder SMARTSIG:1 Application Topical 2-3 Times Daily 05/08/21  Yes [provider]  ?pantoprazole (PROTONIX) 40 MG tablet TAKE 1 TABLET BY MOUTH EVERY DAY ?Patient taking differently: Take 40 mg by mouth daily. 11/04/20  Yes [name], DO  ?polyvinyl alcohol (LIQUIFILM TEARS) 1.4 % ophthalmic solution 1 drop as needed for dry eyes.   Yes [provider]  ?predniSONE (DELTASONE) 5 MG tablet Take 15 mg by mouth daily with breakfast.   Yes [provider]  ?sertraline (ZOLOFT) 100 MG tablet Take 100 mg by mouth daily.   Yes [provider]  ?tadalafil (CIALIS) 20 MG tablet Take 20 mg by mouth daily. 08/27/19  Yes [provider]  ?testosterone cypionate (DEPOTESTOSTERONE CYPIONATE) 200 MG/ML injection Inject 0.5 mg into the muscle every 7 (seven) days. 05/21/21  Yes [provider]  ?traMADol (ULTRAM) 50 MG tablet Take 100 mg by mouth 3 (three) times daily as needed for moderate pain. 08/04/19  Yes [provider]  ?triamcinolone cream (KENALOG) 0.1 % Use 1 application twice daily as needed to red itchy areas below the face 09/30/18  Yes Padgett, Rae Halsted, MD  ?DULoxetine (CYMBALTA) 30 MG capsule Take 30 mg by mouth daily. ?Patient not taking: Reported on 07/21/2021 09/25/19   [provider]  ?empagliflozin (JARDIANCE) 25 MG TABS tablet Take 10 mg by mouth. Take 1/2 tab ?Patient not taking: Reported on 07/21/2021 08/25/18   [provider]  ?gabapentin (NEURONTIN) 100 MG capsule Take 1  capsule (100 mg total) by mouth 3 (three) times daily. ?Patient not taking: Reported on 07/21/2021 01/17/20   [name], DO  ?  ? ?Family History  ?Problem Relation Age of Onset  ? Hypertension Mother   ? Diabetes Mother   ? Arthritis Mother   ? Depression Mother   ? Heart attack Father   ? Hypertension Father   ? ? ?Social History  ? ?Socioeconomic History  ? Marital status: Married  ?  Spouse name: Not on file  ? Number of children: 4  ? Years of education: 55  ? Highest education level: Not on file  ?Occupational History  ? Occupation: Retired  ?Tobacco Use  ? Smoking status: Never  ? Smokeless tobacco: Never  ?Vaping Use  ? Vaping Use: Never used  ?Substance and Sexual Activity  ? Alcohol use: No  ? Drug use: No  ? Sexual activity: Not on file  ?Other Topics Concern  ? Not on file  ?Social History Narrative  ? Lives at home with his wife and son.  ? Right-handed.  ? 2 cups caffeine per day.  ? ?Social Determinants  of Health  ? ?Financial Resource Strain: Not on file  ?Food Insecurity: Not on file  ?Transportation Needs: Not on file  ?Physical Activity: Not on file  ?Stress: Not on file  ?Social Connections: Not on file  ? ? ? ? ?Review of Systems see above; denies N/V or bleeding ? ?Vital Signs: ?BP 119/88 (BP Location: Left Arm)   Pulse (!) 127   Temp 98.8 ?F (37.1 ?C)   Resp 20   Ht '5\' 8"'$  (1.727 m)   Wt 208 lb 15.9 oz (94.8 kg)   SpO2 95%   BMI 31.78 kg/m?  ? ?Physical Exam awake but very anxious/restless; chest- CTA bilat; heart- tachy but reg rhythm; abd soft,+BS, NT; 1-2+ RLE edema noted, no LLE edema ? ?Imaging: ?CT Head Wo Contrast ? ?Result Date: 07/21/2021 ?CLINICAL DATA:  Altered mental status EXAM: CT HEAD WITHOUT CONTRAST TECHNIQUE: Contiguous axial images were obtained from the base of the skull through the vertex without intravenous contrast. RADIATION DOSE REDUCTION: This exam was performed according to the departmental dose-optimization program which includes automated exposure control,  adjustment of the mA and/or kV according to patient size and/or use of iterative reconstruction technique. COMPARISON:  02/22/2020 FINDINGS: Brain: No acute intracranial findings are seen. There is prominence of thir

## 2021-07-22 NOTE — ED Notes (Signed)
Pt and wife given warm blankets and a recliner.  ?

## 2021-07-22 NOTE — Assessment & Plan Note (Signed)
-   Patient has a history of PTSD, had anxiety/panic attack this a.m., placed on Ativan 0.5 mg IV x1 then as needed ?

## 2021-07-22 NOTE — Assessment & Plan Note (Signed)
-   BP currently is soft, hold losartan, continue IV fluid hydration ?

## 2021-07-22 NOTE — Progress Notes (Addendum)
eLink Physician-Brief Progress Note ?Patient Name: [name] ?DOB: 06-03-1950 ?MRN: [id number] ? ? ?Date of Service ? 07/22/2021  ?HPI/Events of Note ? Multiple issues: 1. Agitation -  Patient arrives in ICU on a Precedex IV infusion at 1 mcg/kg/hour. Patient needs to keep fairly still d/t  IR catheter placement for thrombolytics. Patient appears obtunded. Snoring respirations. 2. BP = 77/47 with MAP = 61  ?eICU Interventions ? Plan: ?Precedex IV infuion at low dose (0.1 to 0.5 mcg/kg/min. Titrate to RASS = 0.  ?Phenylephrine IV infusion via PIV. Titrate to MAP >= 65. ?Bolus with 0.9 NaCl 500 mL IV over 30 minutes now.   ? ? ? ?Intervention Category ?Major Interventions: Delirium, psychosis, severe agitation - evaluation and management ? ?[name] ?07/22/2021, 9:54 PM ?

## 2021-07-22 NOTE — Assessment & Plan Note (Signed)
Continue PPI ?

## 2021-07-22 NOTE — Assessment & Plan Note (Signed)
Continue Synthroid °

## 2021-07-22 NOTE — Assessment & Plan Note (Signed)
-   Baseline creatinine 1.3-1.4 ?-Presented with creatinine of 1.6 likely due to hypoperfusion, acute PE ?-Hold losartan, continue IV fluid hydration ?

## 2021-07-22 NOTE — Progress Notes (Signed)
ANTICOAGULATION CONSULT NOTE ? ?Pharmacy Consult for Heparin ?Indication: pulmonary embolus ? ?Allergies  ?Allergen Reactions  ? Benazepril Cough  ? Gabapentin Swelling  ? Hydrocodone   ? Shellfish Allergy   ? ? ?Patient Measurements: ?Height: '5\' 8"'$  (172.7 cm) ?Weight: 94.8 kg (208 lb 15.9 oz) ?IBW/kg (Calculated) : 68.4 ?Heparin Dosing Weight: 88.3 kg ? ?Vital Signs: ?Temp: 98.8 ?F (37.1 ?C) (03/25 1137) ?Temp Source: Oral (03/25 0754) ?BP: 119/88 (03/25 1137) ?Pulse Rate: 127 (03/25 1245) ? ?Labs: ?Recent Labs  ?  07/21/21 ?1749 07/21/21 ?1800 07/21/21 ?2010 07/21/21 ?2032 07/22/21 ?0253 07/22/21 ?0939  ?HGB  --   --   --  13.7 13.3  --   ?HCT  --   --   --  41.0 41.2  --   ?PLT  --   --   --  130* 129*  --   ?LABPROT  --   --   --  14.5  --   --   ?INR  --   --   --  1.1  --   --   ?HEPARINUNFRC  --   --   --   --   --  1.05*  ?CREATININE  --  1.63*  --   --  1.35*  --   ?TROPONINIHS 852*  --  1,507*  --  1,450*  --   ? ? ? ?Estimated Creatinine Clearance: 55.3 mL/min (A) (by C-G formula based on SCr of 1.35 mg/dL (H)). ? ?Medications:  ? ?Infusions:  ? heparin 1,450 Units/hr (07/22/21 1253)  ? lactated ringers 100 mL/hr at 07/22/21 0646  ? ?PRN:  ? ?Assessment: ?54 yom with a history of DM, HTN, neuropathy, myopathy/myositis, RA. Patient is presenting with weakness, chest pain. Heparin per pharmacy consult placed for pulmonary embolus on CT angio chest. ? ?Patient is not on anticoagulation prior to arrival. ? ?Heparin level this afternoon elevated at 1.05.  Appears to have been drawn from opposite arm.  No overt bleeding or complications noted, and hemoglobin stable. ? ?Goal of Therapy:  ?Heparin level 0.3-0.7 units/ml ?Monitor platelets by anticoagulation protocol: Yes ?  ?Plan:  ?Decrease IV heparin to 1450 units/hr. ?Repeat heparin level in 8 hrs. ?Daily heparin level and CBC. ? ?[place] Crane, Pharm D, BCPS, BCCP ?Clinical Pharmacist ? 07/22/2021 1:37 PM  ? ?[facility] pharmacy phone numbers are listed on  [email or web address] ? ? ? ?

## 2021-07-22 NOTE — Hospital Course (Signed)
Patient is a 71 year old male with history of diabetes mellitus, hypertension, anemia, hypothyroidism, BPH, neuropathy, PTSD, autoimmune myositis presented with weakness, pain and confusion.  Patient reported 2 episodes of confusion on the day of admission, brief hypoglycemia.  Also reported generally weak for a while with associated chest pain, shortness of breath, abdominal pain, right lower extremity pain and swelling. ?CT angiogram chest showed saddle PE with right heart strain. ?Venous Dopplers showed occlusive right popliteal DVT. ?Patient was started on IV heparin drip and admitted for further work-up. ?

## 2021-07-22 NOTE — Assessment & Plan Note (Signed)
-   Lactic acid 4.1, continue IV fluid hydration, repeat ?

## 2021-07-22 NOTE — Consult Note (Signed)
? ?NAME:  [name], MRN:  [id number], DOB:  [birth year], LOS: 0 ?ADMISSION DATE:  07/21/2021, CONSULTATION DATE:  07/22/21 ?REFERRING MD:  Dr Tana Coast, CHIEF COMPLAINT:  Dyspnea, hypoxemia  ? 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  He is on chronic testosterone injection therapy.  He presented 3/24 with some generalized weakness (question subacute), about 1 week of chest and abdominal discomfort, acute on chronic right lower extremity pain and dyspnea.  He also had some confusion was noted to be hypoglycemic. ?His evaluation revealed a large saddle pulmonary embolus with hypoxemia, tachycardia and evidence for right heart strain: RV/LV ratio 1.35, lactic acid 4.9 > 4.1, troponin 1507 > 1450.  Lower extremity Doppler ultrasound showed right popliteal vein DVT, none on the left.  Treated with heparin infusion, IV fluid resuscitation.  Acute on chronic renal insufficiency on presentation, improved to previous baseline by this morning 3/25 (creatinine 1.35).  Echocardiogram is pending.  ? ?Remains tachycardic, requiring 5 L/min.  ? ?Pertinent  Medical History  ? ?Past Medical History:  ?Diagnosis Date  ? Autoimmune disease ([place])   ? Chronic hiccups   ? Diabetes mellitus without complication ([place])   ? GERD (gastroesophageal reflux disease)   ? Hypothyroidism   ? Joint pain   ? Necrotizing myopathy   ? PTSD (post-traumatic stress disorder)   ? ? ?Significant Hospital Events: ?Including procedures, antibiotic start and stop dates in addition to other pertinent events   ?CT-PA 3/24 >> saddle pulmonary embolism with RV/LV ratio 1.35 ?CT abdomen pelvis 3/24 >> no abnormality ?Lower extremity Doppler ultrasound 3/24 >> right popliteal DVT, left clear ? ?Interim History / Subjective:  ?Anxious, uncomfortable.  ? ? ?Objective   ?Blood pressure 125/87, pulse (!) 117, temperature 98.7 ?F (37.1 ?C), temperature source Oral,  resp. rate 20, height '5\' 8"'$  (1.727 m), weight 94.8 kg, SpO2 96 %. ?   ?   ? ?Intake/Output Summary (Last 24 hours) at 07/22/2021 1115 ?Last data filed at 07/22/2021 0901 ?Gross per 24 hour  ?Intake 2240.67 ml  ?Output 2200 ml  ?Net 40.67 ml  ? ?Filed Weights  ? 07/21/21 1740 07/21/21 1937  ?Weight: 94.8 kg 94.8 kg  ? ? ?Examination: ?General: Obese gentleman, laying in bed on his right side, somewhat uncomfortable ?HENT: Oropharynx dry, pupils equal.  No stridor ?Lungs: Clear bilaterally ?Cardiovascular: Tachycardic, distant, regular without a murmur ?Abdomen: Obese, nondistended with positive bowel sounds ?Extremities: Bilateral lower extremity edema more so on the right ?Neuro: Awake, alert, quite anxious.  Will redirect and follow commands, move all extremities ?GU: Deferred ? ?Resolved Hospital Problem list   ?Acute on chronic renal failure ? ?Assessment & Plan:  ?Acute saddle pulmonary embolism, right popliteal DVT.  Evidence for right heart strain based on RV/LV ratio, lactic acidosis, elevated troponin.  Echocardiogram is pending but should give strong consideration to possible targeted lysis versus thrombectomy in this setting. ?-Await echocardiogram results ?-Agree with heparin infusion ?-We will consult with interventional radiology regarding appropriateness for possible targeted lytic therapy versus thrombectomy. I do believe he is a good candidate, although he does have chronic renal insufficiency. ? ?Anxiety disorder, history of PTSD.  Anxious currently, somewhat difficult to tell how much his acute illness is impacting this. ?-Has Ativan available as needed, may need to add adjunct therapy ?-Continue home Zoloft ? ?Chronic renal insufficiency. ?-ARB currently on hold ?-We will ensure adequate volume resuscitation since he is going to  have a dye load for his pulmonary angiography ? ?DM with hyperglycemia ?-Glipizide and metformin are on hold, was hypoglycemic on presentation ?-On Lantus 42-44 twice daily  at home, had hypoglycemia here on Semglee 20 units twice daily.  Decreased to 15 units twice daily ? ?Hypertension ?-Losartan on hold ? ?Best Practice (right click and "Reselect all SmartList Selections" daily)  ? ?Diet/type: Regular consistency (see orders) ?DVT prophylaxis: systemic heparin ?GI prophylaxis: PPI ?Lines: N/A ?Foley:  N/A ?Code Status:  full code ?Last date of multidisciplinary goals of care discussion [Discussed with pt and wife at bedside 3/25. Full Code. Agree to interventional radiology eval and procedure.] ? ?Labs   ?CBC: ?Recent Labs  ?Lab 07/21/21 ?2032 07/22/21 ?0253  ?WBC 8.8 8.0  ?NEUTROABS 7.3  --   ?HGB 13.7 13.3  ?HCT 41.0 41.2  ?MCV 94.5 98.6  ?PLT 130* 129*  ? ? ?Basic Metabolic Panel: ?Recent Labs  ?Lab 07/21/21 ?1800 07/22/21 ?0253  ?NA 137 140  ?K 5.4* 5.1  ?CL 99 107  ?CO2 28 26  ?GLUCOSE 350* 131*  ?BUN 20 13  ?CREATININE 1.63* 1.35*  ?CALCIUM 10.0 9.8  ? ?GFR: ?Estimated Creatinine Clearance: 55.3 mL/min (A) (by C-G formula based on SCr of 1.35 mg/dL (H)). ?Recent Labs  ?Lab 07/21/21 ?1757 07/21/21 ?2032 07/21/21 ?2038 07/22/21 ?0253  ?WBC  --  8.8  --  8.0  ?LATICACIDVEN 4.9*  --  4.1*  --   ? ? ?Liver Function Tests: ?Recent Labs  ?Lab 07/21/21 ?1800 07/22/21 ?0253  ?AST 42* 32  ?ALT 27 25  ?ALKPHOS 47 45  ?BILITOT 1.2 0.8  ?PROT 6.4* 5.3*  ?ALBUMIN 3.7 3.1*  ? ?Recent Labs  ?Lab 07/21/21 ?1800  ?LIPASE 27  ? ?No results for input(s): AMMONIA in the last 168 hours. ? ?ABG ?   ?Component Value Date/Time  ? HCO3 27.9 (H) 07/04/2007 1848  ? TCO2 29 07/04/2007 1848  ?  ? ?Coagulation Profile: ?Recent Labs  ?Lab 07/21/21 ?2032  ?INR 1.1  ? ? ?Cardiac Enzymes: ?No results for input(s): CKTOTAL, CKMB, CKMBINDEX, TROPONINI in the last 168 hours. ? ?HbA1C: ?Hemoglobin A1C  ?Date/Time Value Ref Range Status  ?08/27/2019 12:00 AM 9.2  Final  ?  Comment:  ?  [facility]  ?12/04/2018 12:00 AM 9.9  Final  ? ? ?CBG: ?Recent Labs  ?Lab 07/21/21 ?1719 07/22/21 ?0108 07/22/21 ?8115  ?GLUCAP 343*  162* 63*  ? ? ?Review of Systems:   ?As per HPI ? ?Past Medical History:  ?He,  has a past medical history of Autoimmune disease ([place]), Chronic hiccups, Diabetes mellitus without complication ([place]), GERD (gastroesophageal reflux disease), Hypothyroidism, Joint pain, Necrotizing myopathy, and PTSD (post-traumatic stress disorder).  ? ?Surgical History:  ? ?Past Surgical History:  ?Procedure Laterality Date  ? BIOPSY SHOULDER    ? Left  ?  ? ?Social History:  ? reports that he has never smoked. He has never used smokeless tobacco. He reports that he does not drink alcohol and does not use drugs.  ? ?Family History:  ?His family history includes Arthritis in his mother; Depression in his mother; Diabetes in his mother; Heart attack in his father; Hypertension in his father and mother.  ? ?Allergies ?Allergies  ?Allergen Reactions  ? Benazepril Cough  ? Gabapentin Swelling  ? Hydrocodone   ? Shellfish Allergy   ?  ? ?Home Medications  ?Prior to Admission medications   ?Medication Sig Start Date End Date Taking? Authorizing Provider  ?acarbose (PRECOSE) 100  MG tablet Take 100 mg by mouth 3 (three) times daily with meals.   Yes [provider]  ?baclofen (LIORESAL) 20 MG tablet Take 20 mg by mouth 2 (two) times daily. 06/22/21  Yes [provider]  ?calcium carbonate (OS-CAL) 600 MG TABS tablet Take 600 mg by mouth 2 (two) times daily with a meal.   Yes [provider]  ?folic acid (FOLVITE) 1 MG tablet Take 1 mg by mouth daily.   Yes [provider]  ?furosemide (LASIX) 20 MG tablet Take 20 mg by mouth daily. 05/25/21  Yes [provider]  ?glipiZIDE (GLUCOTROL XL) 10 MG 24 hr tablet Take 10 mg by mouth 2 (two) times daily.    Yes [provider]  ?hydrOXYzine (ATARAX/VISTARIL) 25 MG tablet TAKE 1 TABLET (25 MG TOTAL) BY MOUTH 3 (THREE) TIMES DAILY AS NEEDED. FOR ITCHING ?Patient taking differently: Take 25 mg by mouth 3 (three) times daily as needed for itching. For  itching 12/28/19  Yes [name], DO  ?insulin glargine (LANTUS) 100 UNIT/ML injection Inject 0.46-0.56 mLs (46-56 Units total) into the skin at bedtime. For now take 30 units twice a day and then in

## 2021-07-22 NOTE — Assessment & Plan Note (Signed)
-   Continue anticoagulation with IV heparin ?

## 2021-07-22 NOTE — Anesthesia Procedure Notes (Signed)
Arterial Line Insertion ?Start/End3/25/2023 6:20 PM ?Performed by: Moshe Salisbury, CRNA, CRNA ? Patient location: Pre-op. ?Preanesthetic checklist: patient identified, IV checked, site marked, risks and benefits discussed, surgical consent, monitors and equipment checked, pre-op evaluation, timeout performed and anesthesia consent ?Lidocaine 1% used for infiltration ?Left, radial was placed ?Catheter size: 20 G ?Hand hygiene performed  and maximum sterile barriers used  ? ?Attempts: 1 ?Procedure performed without using ultrasound guided technique. ?Following insertion, dressing applied and Biopatch. ?Post procedure assessment: normal ? ?Patient tolerated the procedure well with no immediate complications. ? ? ?

## 2021-07-22 NOTE — Transfer of Care (Signed)
Immediate Anesthesia Transfer of Care Note ? ?Patient: [name] ? ?Procedure(s) Performed: IR WITH ANESTHESIA (Right) ? ?Patient Location: PACU and ICU ? ?Anesthesia Type:MAC ? ?Level of Consciousness: sedated and drowsy ? ?Airway & Oxygen Therapy: Patient Spontanous Breathing and Patient connected to nasal cannula oxygen ? ?Post-op Assessment: Report given to RN and Post -op Vital signs reviewed and stable ? ?Post vital signs: Reviewed and stable ? ?Last Vitals:  ?Vitals Value Taken Time  ?BP 118/79   ?Temp    ?Pulse 72 07/22/21 2134  ?Resp 16 07/22/21 2134  ?SpO2 96 % 07/22/21 2134  ?Vitals shown include unvalidated device data. ? ?Last Pain:  ?Vitals:  ? 07/22/21 1515  ?TempSrc: Oral  ?PainSc:   ?   ? ?  ? ?Complications: No notable events documented. ?

## 2021-07-22 NOTE — Assessment & Plan Note (Signed)
-   Troponins elevated 1450<- 1507 <-852.  EKG showed sinus tachycardia with repolarization changes. ?-Likely due to demand ischemia from submassive saddle PE.  Follow 2D echocardiogram, will consult cardiology if significant drop in EF. ?

## 2021-07-22 NOTE — Anesthesia Preprocedure Evaluation (Addendum)
Anesthesia Evaluation  ?Patient identified by MRN, date of birth, ID band ?Patient confused ? ? ? ?Reviewed: ?Allergy & Precautions, H&P , NPO status , Patient's Chart, lab work & pertinent test results ? ?Airway ?Mallampati: III ? ?TM Distance: >3 FB ?Neck ROM: Full ? ? ? Dental ?no notable dental hx. ?(+) Teeth Intact, Dental Advisory Given ?  ?Pulmonary ?neg pulmonary ROS,  ?  ?Pulmonary exam normal ?breath sounds clear to auscultation ? ? ? ? ? ? Cardiovascular ?hypertension, Pt. on medications ?+ DVT  ? ?Rhythm:Regular Rate:Tachycardia ? ?DVT with saddle PE ?  ?Neuro/Psych ?Anxiety  Neuromuscular disease   ? GI/Hepatic ?Neg liver ROS, GERD  ,  ?Endo/Other  ?diabetes, Type 2, Oral Hypoglycemic AgentsHypothyroidism  ? Renal/GU ?CRF and ARFRenal disease  ?negative genitourinary ?  ?Musculoskeletal ? ? Abdominal ?  ?Peds ? Hematology ?negative hematology ROS ?(+)   ?Anesthesia Other Findings ? ? Reproductive/Obstetrics ?negative OB ROS ? ?  ? ? ? ? ? ? ? ? ? ? ? ? ? ?  ?  ? ? ? ? ? ? ? ?Anesthesia Physical ?Anesthesia Plan ? ?ASA: 4 ? ?Anesthesia Plan: MAC  ? ?Post-op Pain Management: Minimal or no pain anticipated  ? ?Induction:  ? ?PONV Risk Score and Plan: 1 and Treatment may vary due to age or medical condition and Ondansetron ? ?Airway Management Planned: Natural Airway and Simple Face Mask ? ?Additional Equipment:  ? ?Intra-op Plan:  ? ?Post-operative Plan:  ? ?Informed Consent: I have reviewed the patients History and Physical, chart, labs and discussed the procedure including the risks, benefits and alternatives for the proposed anesthesia with the patient or authorized representative who has indicated his/her understanding and acceptance.  ? ? ? ?Dental advisory given ? ?Plan Discussed with: CRNA ? ?Anesthesia Plan Comments: (Plan 2IV's, a-line. Minimal sedation with versed and ketamine. GA as back-up. )  ? ? ? ? ?Anesthesia Quick Evaluation ? ?

## 2021-07-22 NOTE — Assessment & Plan Note (Addendum)
-   Likely due to saddle PE.   ?- Currently on 5 L O2 via nasal cannula, sats 96%, wean as tolerated. ?

## 2021-07-22 NOTE — Assessment & Plan Note (Signed)
-   Patient noted to have hypoglycemia, hold glipizide, metformin ?-Patient takes Lantus 42 to 44 units twice daily ?-Received 20 units Semglee overnight, CBG low 63 this a.m. ?- will reduce Semglee to 15 units twice daily, continue sliding scale insulin  ? ? ?

## 2021-07-22 NOTE — Progress Notes (Signed)
ANTICOAGULATION CONSULT NOTE ?Pharmacy Consult for Heparin ?Indication: pulmonary embolus s/p thrombectomy ?Brief A/P: Heparin level supratherapeutic Decrease Heparin rate ? ?Allergies  ?Allergen Reactions  ? Benazepril Cough  ? Gabapentin Swelling  ? Hydrocodone   ? Shellfish Allergy   ? ? ?Patient Measurements: ?Height: '5\' 8"'$  (172.7 cm) ?Weight: 94.8 kg (208 lb 15.9 oz) ?IBW/kg (Calculated) : 68.4 ?Heparin Dosing Weight: 88.3 kg ? ?Vital Signs: ?Temp: 97.9 ?F (36.6 ?C) (03/25 2337) ?Temp Source: Axillary (03/25 2337) ?BP: 107/69 (03/25 2337) ?Pulse Rate: 60 (03/25 2337) ? ?Labs: ?Recent Labs  ?  07/21/21 ?1749 07/21/21 ?1800 07/21/21 ?2010 07/21/21 ?2032 07/22/21 ?0253 07/22/21 ?1610 07/22/21 ?2321  ?HGB  --   --   --  13.7 13.3  --   --   ?HCT  --   --   --  41.0 41.2  --   --   ?PLT  --   --   --  130* 129*  --   --   ?LABPROT  --   --   --  14.5  --   --   --   ?INR  --   --   --  1.1  --   --   --   ?HEPARINUNFRC  --   --   --   --   --  1.05* >1.10*  ?CREATININE  --  1.63*  --   --  1.35*  --   --   ?TROPONINIHS 852*  --  1,507*  --  1,450*  --   --   ? ? ? ?Estimated Creatinine Clearance: 55.3 mL/min (A) (by C-G formula based on SCr of 1.35 mg/dL (H)). ? ? ?Assessment: ?71 y.o. male with DVT/PE s/p thrombectomy for heparin.  Heparin infused during procedure, and heparin 2000 units IV bolus given as well.   ? ?Goal of Therapy:  ?Heparin level 0.3-0.7 units/ml ?Monitor platelets by anticoagulation protocol: Yes ?  ?Plan:  ?Decrease Heparin 1250 units/hr ?Check heparin level in 8 hours. ? ?Phillis Knack, PharmD, BCPS ? ? ? ? ?

## 2021-07-23 ENCOUNTER — Inpatient Hospital Stay (HOSPITAL_COMMUNITY): Payer: Medicare Other

## 2021-07-23 DIAGNOSIS — I2609 Other pulmonary embolism with acute cor pulmonale: Secondary | ICD-10-CM

## 2021-07-23 DIAGNOSIS — I2692 Saddle embolus of pulmonary artery without acute cor pulmonale: Secondary | ICD-10-CM | POA: Diagnosis not present

## 2021-07-23 DIAGNOSIS — I5021 Acute systolic (congestive) heart failure: Secondary | ICD-10-CM

## 2021-07-23 LAB — CBC
HCT: 37.8 % — ABNORMAL LOW (ref 39.0–52.0)
Hemoglobin: 12.7 g/dL — ABNORMAL LOW (ref 13.0–17.0)
MCH: 31.6 pg (ref 26.0–34.0)
MCHC: 33.6 g/dL (ref 30.0–36.0)
MCV: 94 fL (ref 80.0–100.0)
Platelets: 120 10*3/uL — ABNORMAL LOW (ref 150–400)
RBC: 4.02 MIL/uL — ABNORMAL LOW (ref 4.22–5.81)
RDW: 14 % (ref 11.5–15.5)
WBC: 8.4 10*3/uL (ref 4.0–10.5)
nRBC: 0.4 % — ABNORMAL HIGH (ref 0.0–0.2)

## 2021-07-23 LAB — GLUCOSE, CAPILLARY
Glucose-Capillary: 146 mg/dL — ABNORMAL HIGH (ref 70–99)
Glucose-Capillary: 119 mg/dL — ABNORMAL HIGH (ref 70–99)
Glucose-Capillary: 53 mg/dL — ABNORMAL LOW (ref 70–99)
Glucose-Capillary: 85 mg/dL (ref 70–99)
Glucose-Capillary: 116 mg/dL — ABNORMAL HIGH (ref 70–99)
Glucose-Capillary: 92 mg/dL (ref 70–99)

## 2021-07-23 LAB — BASIC METABOLIC PANEL
Anion gap: 8 (ref 5–15)
BUN: 12 mg/dL (ref 8–23)
CO2: 24 mmol/L (ref 22–32)
Calcium: 9.2 mg/dL (ref 8.9–10.3)
Chloride: 113 mmol/L — ABNORMAL HIGH (ref 98–111)
Creatinine, Ser: 1.08 mg/dL (ref 0.61–1.24)
GFR, Estimated: >60 mL/min (ref >60)
Glucose, Bld: 149 mg/dL — ABNORMAL HIGH (ref 70–99)
Potassium: 4.2 mmol/L (ref 3.5–5.1)
Sodium: 145 mmol/L (ref 135–145)

## 2021-07-23 LAB — HEPARIN LEVEL (UNFRACTIONATED)
Heparin Unfractionated: 0.95 IU/mL — ABNORMAL HIGH (ref 0.30–0.70)
Heparin Unfractionated: 0.28 IU/mL — ABNORMAL LOW (ref 0.30–0.70)

## 2021-07-23 LAB — POCT I-STAT 7, (LYTES, BLD GAS, ICA,H+H)
Acid-Base Excess: 1 mmol/L (ref 0.0–2.0)
Bicarbonate: 25 mmol/L (ref 20.0–28.0)
Calcium, Ion: 1.37 mmol/L (ref 1.15–1.40)
HCT: 41 % (ref 39.0–52.0)
Hemoglobin: 13.9 g/dL (ref 13.0–17.0)
O2 Saturation: 97 %
Patient temperature: 97.6
Potassium: 4.8 mmol/L (ref 3.5–5.1)
Sodium: 146 mmol/L — ABNORMAL HIGH (ref 135–145)
TCO2: 26 mmol/L (ref 22–32)
pCO2 arterial: 35.1 mmHg (ref 32–48)
pH, Arterial: 7.458 — ABNORMAL HIGH (ref 7.35–7.45)
pO2, Arterial: 79 mmHg — ABNORMAL LOW (ref 83–108)

## 2021-07-23 LAB — ECHOCARDIOGRAM COMPLETE
Area-P 1/2: 3.08 cm2
Calc EF: 35.4 %
Height: 68 in
S' Lateral: 4 cm
Single Plane A2C EF: 45.4 %
Single Plane A4C EF: 21.8 %
Weight: 3343.94 oz

## 2021-07-23 MED ORDER — DEXTROSE 50 % IV SOLN
INTRAVENOUS | Status: AC
  Filled 2021-07-23: qty 50

## 2021-07-23 MED ORDER — PERFLUTREN LIPID MICROSPHERE
1.0000 mL | INTRAVENOUS | Status: AC | PRN
  Administered 2021-07-23: 4 mL via INTRAVENOUS
  Filled 2021-07-23: qty 10

## 2021-07-23 MED ORDER — ASPIRIN 81 MG PO CHEW
81.0000 mg | CHEWABLE_TABLET | Freq: Every day | ORAL | Status: DC
  Administered 2021-07-23 – 2021-07-27 (×4): 81 mg via ORAL
  Filled 2021-07-23 (×5): qty 1

## 2021-07-23 MED ORDER — LIP MEDEX EX OINT
TOPICAL_OINTMENT | CUTANEOUS | Status: DC | PRN
  Administered 2021-07-23: 1 via TOPICAL
  Filled 2021-07-23: qty 7

## 2021-07-23 MED ORDER — ATORVASTATIN CALCIUM 80 MG PO TABS
80.0000 mg | ORAL_TABLET | Freq: Every day | ORAL | Status: DC
Start: 2021-07-23 — End: 2021-07-27
  Administered 2021-07-23 – 2021-07-26 (×4): 80 mg via ORAL
  Filled 2021-07-23 (×4): qty 1

## 2021-07-23 MED ORDER — HALOPERIDOL LACTATE 5 MG/ML IJ SOLN
1.0000 mg | INTRAMUSCULAR | Status: DC | PRN
  Administered 2021-07-23 – 2021-07-24 (×3): 1 mg via INTRAVENOUS
  Filled 2021-07-23 (×3): qty 1

## 2021-07-23 MED ORDER — DEXTROSE 50 % IV SOLN
25.0000 g | INTRAVENOUS | Status: AC
  Administered 2021-07-23: 25 g via INTRAVENOUS

## 2021-07-23 MED ORDER — INSULIN ASPART 100 UNIT/ML IJ SOLN
0.0000 [IU] | INTRAMUSCULAR | Status: DC
  Administered 2021-07-24: 5 [IU] via SUBCUTANEOUS

## 2021-07-23 NOTE — Progress Notes (Signed)
eLink Physician-Brief Progress Note ?Patient Name: [name] ?DOB: [birth year] ?MRN: [id number] ? ? ?Date of Service ? 07/23/2021  ?HPI/Events of Note ? Agitation - QTc interval = 0.43 seconds.   ?eICU Interventions ? Plan: ?Haldol 1-2 mg IV Q 3 hours PRN agitation.   ? ? ? ?Intervention Category ?Major Interventions: Other: ? ?Anjalee Cope Cornelia Copa ?07/23/2021, 4:39 AM ?

## 2021-07-23 NOTE — Progress Notes (Addendum)
eLink Physician-Brief Progress Note ?Patient Name: [name] ?DOB: [birth year] ?MRN: [id number] ? ? ?Date of Service ? 07/23/2021  ?HPI/Events of Note ? Severe agitation - Patient trying to sit up in bed.   ?eICU Interventions ? Plan: ?Bilateral soft wrist restraints X 9 hours. ?Increase ceiling on Precedex IV infusion to 0.8 mcg/kg/hour. ?ABG STAT.  ? ? ? ?Intervention Category ?Major Interventions: Delirium, psychosis, severe agitation - evaluation and management ? ?Betsie Peckman Cornelia Copa ?07/23/2021, 1:17 AM ?

## 2021-07-23 NOTE — Progress Notes (Signed)
Hypoglycemic Event ? ?CBG: 53 ? ?Treatment: D50 50 mL (25 gm) ? ?Symptoms: None ? ?Follow-up CBG: Time:1646 CBG Result:85 ? ?Possible Reasons for Event: Inadequate meal intake ? ?Comments/MD notified:Dr. Halford Chessman notified. Clear liquid diet ordered.  ? ? ? ?[name] ? ? ?

## 2021-07-23 NOTE — Progress Notes (Signed)
? ? ?Referring Physician(s): Baltazar Apo, MD ? ?Supervising Physician: Mir, Sharen Heck ? ?Patient Status:  [facility] - In-pt ? ?Chief Complaint: Follow up PE thrombectomy 3/25 ? ?Subjective: ? ?Patient sleeping, stirs but does not open eyes to verbal/tactile cues. Per chart started on precedex overnight for agitation. ? ?Allergies: ?Benazepril, Gabapentin, Hydrocodone, and Shellfish allergy ? ?Medications: ?Prior to Admission medications   ?Medication Sig Start Date End Date Taking? Authorizing Provider  ?acarbose (PRECOSE) 100 MG tablet Take 100 mg by mouth 3 (three) times daily with meals.   Yes [provider]  ?baclofen (LIORESAL) 20 MG tablet Take 20 mg by mouth 2 (two) times daily. 06/22/21  Yes [provider]  ?calcium carbonate (OS-CAL) 600 MG TABS tablet Take 600 mg by mouth 2 (two) times daily with a meal.   Yes [provider]  ?folic acid (FOLVITE) 1 MG tablet Take 1 mg by mouth daily.   Yes [provider]  ?furosemide (LASIX) 20 MG tablet Take 20 mg by mouth daily. 05/25/21  Yes [provider]  ?glipiZIDE (GLUCOTROL XL) 10 MG 24 hr tablet Take 10 mg by mouth 2 (two) times daily.    Yes [provider]  ?hydrOXYzine (ATARAX/VISTARIL) 25 MG tablet TAKE 1 TABLET (25 MG TOTAL) BY MOUTH 3 (THREE) TIMES DAILY AS NEEDED. FOR ITCHING ?Patient taking differently: Take 25 mg by mouth 3 (three) times daily as needed for itching. For itching 12/28/19  Yes [name], DO  ?insulin glargine (LANTUS) 100 UNIT/ML injection Inject 0.46-0.56 mLs (46-56 Units total) into the skin at bedtime. For now take 30 units twice a day and then increase to your usual dose as your oral intake improves. Increase both doses by 2units every day. Check your blood sugars at home three times daily. Call your doctor if sugar level is more than 300 or less than 80. ?Patient taking differently: Inject 42-44 Units into the skin 2 (two) times daily. Check your blood sugars at home three times  daily. Call your doctor if sugar level is more than 300 or less than 80. 10/01/14  Yes Bonnielee Haff, MD  ?Levothyroxine Sodium 88 MCG CAPS Take 88 mcg by mouth daily.   Yes [provider]  ?losartan (COZAAR) 50 MG tablet Take 25 mg by mouth daily. Take 1/2 tablet   Yes [provider]  ?metFORMIN (GLUCOPHAGE) 1000 MG tablet Take 1,000 mg by mouth 2 (two) times daily. 06/20/21  Yes [provider]  ?methotrexate (RHEUMATREX) 5 MG tablet Take 2 tablets (10 mg total) by mouth once a week. Caution: Chemotherapy. Protect from light. Take on fridays per patient. ? ?OKAY TO RESUME FROM 10/08/14. ?Patient taking differently: Take 30 mg by mouth once a week. Caution: Chemotherapy. Protect from light. Take on fridays per patient. 6 Tabs once a week 10/01/14  Yes Bonnielee Haff, MD  ?nystatin (MYCOSTATIN/NYSTOP) powder SMARTSIG:1 Application Topical 2-3 Times Daily 05/08/21  Yes [provider]  ?pantoprazole (PROTONIX) 40 MG tablet TAKE 1 TABLET BY MOUTH EVERY DAY ?Patient taking differently: Take 40 mg by mouth daily. 11/04/20  Yes [name], DO  ?polyvinyl alcohol (LIQUIFILM TEARS) 1.4 % ophthalmic solution 1 drop as needed for dry eyes.   Yes [provider]  ?predniSONE (DELTASONE) 5 MG tablet Take 15 mg by mouth daily with breakfast.   Yes [provider]  ?sertraline (ZOLOFT) 100 MG tablet Take 100 mg by mouth daily.   Yes [provider]  ?tadalafil (CIALIS) 20 MG tablet Take 20  mg by mouth daily. 08/27/19  Yes [provider]  ?testosterone cypionate (DEPOTESTOSTERONE CYPIONATE) 200 MG/ML injection Inject 0.5 mg into the muscle every 7 (seven) days. 05/21/21  Yes [provider]  ?traMADol (ULTRAM) 50 MG tablet Take 100 mg by mouth 3 (three) times daily as needed for moderate pain. 08/04/19  Yes [provider]  ?triamcinolone cream (KENALOG) 0.1 % Use 1 application twice daily as needed to red itchy areas below the face 09/30/18  Yes  Padgett, Rae Halsted, MD  ?DULoxetine (CYMBALTA) 30 MG capsule Take 30 mg by mouth daily. ?Patient not taking: Reported on 07/21/2021 09/25/19   [provider]  ?empagliflozin (JARDIANCE) 25 MG TABS tablet Take 10 mg by mouth. Take 1/2 tab ?Patient not taking: Reported on 07/21/2021 08/25/18   [provider]  ?gabapentin (NEURONTIN) 100 MG capsule Take 1 capsule (100 mg total) by mouth 3 (three) times daily. ?Patient not taking: Reported on 07/21/2021 01/17/20   [name], DO  ? ? ? ?Vital Signs: ?BP (!) 89/62   Pulse 79   Temp 98 ?F (36.7 ?C) (Axillary)   Resp 15   Ht '5\' 8"'$  (1.727 m)   Wt 208 lb 15.9 oz (94.8 kg)   SpO2 95%   BMI 31.78 kg/m?  ? ?Physical Exam ?Vitals and nursing note reviewed.  ?HENT:  ?   Head: Normocephalic.  ?Cardiovascular:  ?   Rate and Rhythm: Normal rate.  ?   Comments: (+) left CFV puncture site clean, dry, dressed appropriately. No active bleeding or drainage.  ?Pulmonary:  ?   Effort: Pulmonary effort is normal.  ?   Comments: Snoring ?Skin: ?   General: Skin is warm and dry.  ? ? ?Imaging: ?CT Head Wo Contrast ? ?Result Date: 07/21/2021 ?CLINICAL DATA:  Altered mental status EXAM: CT HEAD WITHOUT CONTRAST TECHNIQUE: Contiguous axial images were obtained from the base of the skull through the vertex without intravenous contrast. RADIATION DOSE REDUCTION: This exam was performed according to the departmental dose-optimization program which includes automated exposure control, adjustment of the mA and/or kV according to patient size and/or use of iterative reconstruction technique. COMPARISON:  02/22/2020 FINDINGS: Brain: No acute intracranial findings are seen. There is prominence of third and both lateral ventricles with no significant interval change. Cortical sulci are prominent. Vascular: Unremarkable. Skull: No fracture is seen in the calvarium. Sinuses/Orbits: There is mucosal thickening in the ethmoid sinus. Other: None IMPRESSION: No acute intracranial  findings are seen in noncontrast CT brain. Chronic ethmoid sinusitis. Electronically Signed   By: Elmer Picker M.D.   On: 07/21/2021 19:36  ? ?CT Angio Chest PE W and/or Wo Contrast ? ?Result Date: 07/21/2021 ?CLINICAL DATA:  Pulmonary embolism suspected EXAM: CT ANGIOGRAPHY CHEST CT ABDOMEN AND PELVIS WITH CONTRAST TECHNIQUE: Multidetector CT imaging of the chest was performed using the standard protocol during bolus administration of intravenous contrast. Multiplanar CT image reconstructions and MIPs were obtained to evaluate the vascular anatomy. Multidetector CT imaging of the abdomen and pelvis was performed using the standard protocol during bolus administration of intravenous contrast. RADIATION DOSE REDUCTION: This exam was performed according to the departmental dose-optimization program which includes automated exposure control, adjustment of the mA and/or kV according to patient size and/or use of iterative reconstruction technique. CONTRAST:  119m OMNIPAQUE IOHEXOL 350 MG/ML SOLN COMPARISON:  None. FINDINGS: CTA CHEST FINDINGS Cardiovascular: There is a saddlepulmonary embolus that extends into segmental branches bilaterally. There is evidence of right heart strain within RV/LV ratio  of 1.35. The aorta is normal aside from mild atherosclerotic calcification. Mediastinum/Nodes: No mediastinal, hilar or axillary lymphadenopathy. The visualized thyroid and thoracic esophageal course are unremarkable. Lungs/Pleura: No pulmonary nodules or masses. No pleural effusion or pneumothorax. No focal airspace consolidation. No focal pleural abnormality. Musculoskeletal: No chest wall abnormality. No acute or significant osseous findings. Review of the MIP images confirms the above findings. CT ABDOMEN and PELVIS FINDINGS Hepatobiliary: Normal hepatic contours and density. No visible biliary dilatation. Normal gallbladder. Pancreas: Normal contours without ductal dilatation. No peripancreatic fluid collection.  Spleen: Normal. Adrenals/Urinary Tract: --Adrenal glands: Normal. --Right kidney/ureter: No hydronephrosis or perinephric stranding. No nephrolithiasis. No obstructing ureteral stones. --Left kidney/ureter:

## 2021-07-23 NOTE — Consult Note (Signed)
? ?NAME:  [name], MRN:  [id number], DOB:  19-Aug-1950, LOS: 1 ?ADMISSION DATE:  07/21/2021, CONSULTATION DATE:  07/22/21 ?REFERRING MD:  Dr Tana Coast, CHIEF COMPLAINT:  Dyspnea, hypoxemia  ? 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  He is on chronic testosterone injection therapy.  He presented 3/24 with some generalized weakness (question subacute), about 1 week of chest and abdominal discomfort, acute on chronic right lower extremity pain and dyspnea.  He also had some confusion was noted to be hypoglycemic. ?His evaluation revealed a large saddle pulmonary embolus with hypoxemia, tachycardia and evidence for right heart strain: RV/LV ratio 1.35, lactic acid 4.9 > 4.1, troponin 1507 > 1450.  Lower extremity Doppler ultrasound showed right popliteal vein DVT, none on the left.  Treated with heparin infusion, IV fluid resuscitation.  Acute on chronic renal insufficiency on presentation, improved to previous baseline by this morning 3/25 (creatinine 1.35).  Echocardiogram is pending.  ? ?Remains tachycardic, requiring 5 L/min.  ? ?Pertinent  Medical History  ? ?Past Medical History:  ?Diagnosis Date  ? Autoimmune disease ([place])   ? Bronchitis   ? Chronic hiccups   ? Diabetes mellitus without complication ([place])   ? GERD (gastroesophageal reflux disease)   ? Hypothyroidism   ? Joint pain   ? Necrotizing myopathy   ? PTSD (post-traumatic stress disorder)   ? ? ?Significant Hospital Events: ?Including procedures, antibiotic start and stop dates in addition to other pertinent events   ?CT-PA 3/24 >> saddle pulmonary embolism with RV/LV ratio 1.35 ?CT abdomen pelvis 3/24 >> no abnormality ?Lower extremity Doppler ultrasound 3/24 >> right popliteal DVT, left clear ? ?Interim History / Subjective:  ?Patient is  ? ?BP and HR stable ?96% on 2 L ?0.8 Precedex ?Off of levophed ? ?Objective   ?Blood pressure 107/79, pulse 66,  temperature 98 ?F (36.7 ?C), temperature source Axillary, resp. rate 19, height '5\' 8"'$  (1.727 m), weight 94.8 kg, SpO2 95 %. ?   ?   ? ?Intake/Output Summary (Last 24 hours) at 07/23/2021 0826 ?Last data filed at 07/23/2021 0600 ?Gross per 24 hour  ?Intake 2505.96 ml  ?Output 5030 ml  ?Net -2524.04 ml  ? ? ?Filed Weights  ? 07/21/21 1740 07/21/21 1937  ?Weight: 94.8 kg 94.8 kg  ? ? ?Examination: ?General: Obese gentleman, laying in bed on his right side, somewhat uncomfortable ?HENT: Oropharynx dry, pupils equal.  No stridor ?Lungs: Clear bilaterally ?Cardiovascular: Tachycardic, distant, regular without a murmur ?Abdomen: Obese, nondistended with positive bowel sounds ?Extremities: Bilateral lower extremity edema more so on the right ?Neuro: Awake, alert, quite anxious.  Will redirect and follow commands, move all extremities ?GU: Deferred ? ?Resolved Hospital Problem list   ?Acute on chronic renal failure ? ?Assessment & Plan:  ?Acute saddle pulmonary embolism, right popliteal DVT.  Evidence for right heart strain based on RV/LV ratio, lactic acidosis, elevated troponin.   ?Technically massive PE because he required Levophed. ?Could be triggered by autoimmune disease and testosterone injection. ?Status post thrombectomy yesterday. ?-Await echocardiogram results ?-Continue heparin infusion.  Consider DOAC when tolerate p.o. kidney function should be adequate for DOAC.  May need lifelong treatment given ongoing autoimmune disease.  Can stop his testosterone injection to reduce risk. ? ?Anxiety disorder, history of PTSD   ?He was agitated since admission.  Currently on Precedex.  Will transfer down to allow to wake up more,. ?-Has Ativan available as needed, may  need to add adjunct therapy ?-Continue home Zoloft ?-Will bedside swallowing when awake ? ?Chronic renal insufficiency ?Serum creatinine at baseline ?-Pending BMP today ?-ARB currently on hold ? ?DM with hyperglycemia ?CBG at goal ?-Glipizide and metformin are  on hold, was hypoglycemic on presentation ?-Continue Semglee15 units twice daily ?-Change sliding scale to Q4h while NPO. ? ?Mild thrombocytopenia ?-Follow-up CBC ? ?Hypertension ?-Losartan on hold.  Blood pressure remains in good range ? ?Urinary retention  ?Foley placed yesterday for multiple high readings from bladder scan.  Patient is not on BPH meds at home. ?-We will attempt voiding trial when he is more awake ? ?Best Practice (right click and "Reselect all SmartList Selections" daily)  ? ?Diet/type: NPO ?DVT prophylaxis: systemic heparin ?GI prophylaxis: PPI ?Lines: art line ?Foley:  yes ?Code Status:  full code ?Last date of multidisciplinary goals of care discussion will update family. ?Labs   ?CBC: ?Recent Labs  ?Lab 07/21/21 ?2032 07/22/21 ?0253 07/23/21 ?0118  ?WBC 8.8 8.0  --   ?NEUTROABS 7.3  --   --   ?HGB 13.7 13.3 13.9  ?HCT 41.0 41.2 41.0  ?MCV 94.5 98.6  --   ?PLT 130* 129*  --   ? ? ? ?Basic Metabolic Panel: ?Recent Labs  ?Lab 07/21/21 ?1800 07/22/21 ?0253 07/23/21 ?0118  ?NA 137 140 146*  ?K 5.4* 5.1 4.8  ?CL 99 107  --   ?CO2 28 26  --   ?GLUCOSE 350* 131*  --   ?BUN 20 13  --   ?CREATININE 1.63* 1.35*  --   ?CALCIUM 10.0 9.8  --   ? ? ?GFR: ?Estimated Creatinine Clearance: 55.3 mL/min (A) (by C-G formula based on SCr of 1.35 mg/dL (H)). ?Recent Labs  ?Lab 07/21/21 ?1757 07/21/21 ?2032 07/21/21 ?2038 07/22/21 ?0253  ?WBC  --  8.8  --  8.0  ?LATICACIDVEN 4.9*  --  4.1*  --   ? ? ? ?Liver Function Tests: ?Recent Labs  ?Lab 07/21/21 ?1800 07/22/21 ?0253  ?AST 42* 32  ?ALT 27 25  ?ALKPHOS 47 45  ?BILITOT 1.2 0.8  ?PROT 6.4* 5.3*  ?ALBUMIN 3.7 3.1*  ? ? ?Recent Labs  ?Lab 07/21/21 ?1800  ?LIPASE 27  ? ? ?No results for input(s): AMMONIA in the last 168 hours. ? ?ABG ?   ?Component Value Date/Time  ? PHART 7.458 (H) 07/23/2021 0118  ? PCO2ART 35.1 07/23/2021 0118  ? PO2ART 79 (L) 07/23/2021 0118  ? HCO3 25.0 07/23/2021 0118  ? TCO2 26 07/23/2021 0118  ? O2SAT 97 07/23/2021 0118  ? ?  ? ?Coagulation  Profile: ?Recent Labs  ?Lab 07/21/21 ?2032  ?INR 1.1  ? ? ? ?Cardiac Enzymes: ?No results for input(s): CKTOTAL, CKMB, CKMBINDEX, TROPONINI in the last 168 hours. ? ?HbA1C: ?Hemoglobin A1C  ?Date/Time Value Ref Range Status  ?08/27/2019 12:00 AM 9.2  Final  ?  Comment:  ?  [facility]  ?12/04/2018 12:00 AM 9.9  Final  ? ? ?CBG: ?Recent Labs  ?Lab 07/22/21 ?0800 07/22/21 ?1138 07/22/21 ?1616 07/22/21 ?2126 07/23/21 ?0708  ?GLUCAP 184* 212* 146* 186* 146*  ? ? ? ?Review of Systems:   ?As per HPI ? ?Past Medical History:  ?He,  has a past medical history of Autoimmune disease ([place]), Bronchitis, Chronic hiccups, Diabetes mellitus without complication ([place]), GERD (gastroesophageal reflux disease), Hypothyroidism, Joint pain, Necrotizing myopathy, and PTSD (post-traumatic stress disorder).  ? ?Surgical History:  ? ?Past Surgical History:  ?Procedure Laterality Date  ? BIOPSY SHOULDER    ?  Left  ? IR THROMBECT VENO MECH MOD SED  07/22/2021  ? UPPER LEG SOFT TISSUE BIOPSY Right   ?  ? ?Social History:  ? reports that he has never smoked. He has never used smokeless tobacco. He reports that he does not drink alcohol and does not use drugs.  ? ?Family History:  ?His family history includes Arthritis in his mother; Depression in his mother; Diabetes in his mother; Heart attack in his father; Hypertension in his father and mother.  ? ?Allergies ?Allergies  ?Allergen Reactions  ? Benazepril Cough  ? Gabapentin Swelling  ? Hydrocodone   ? Shellfish Allergy   ?  ? ?Home Medications  ?Prior to Admission medications   ?Medication Sig Start Date End Date Taking? Authorizing Provider  ?acarbose (PRECOSE) 100 MG tablet Take 100 mg by mouth 3 (three) times daily with meals.   Yes [provider]  ?baclofen (LIORESAL) 20 MG tablet Take 20 mg by mouth 2 (two) times daily. 06/22/21  Yes [provider]  ?calcium carbonate (OS-CAL) 600 MG TABS tablet Take 600 mg by mouth 2 (two) times daily with a meal.   Yes [provider]  ?folic acid (FOLVITE) 1 MG tablet Take 1 mg by mouth daily.   Yes [provider]  ?furosemide (LASIX) 20 MG tablet Take 20 mg by mouth daily. 05/25/21  Yes [provider]

## 2021-07-23 NOTE — Consult Note (Signed)
? ?CONSULTATION NOTE  ? ?Patient Name: [name] ?Date of Encounter: 07/23/2021 ?Cardiologist: None ?Electrophysiologist: None ?Advanced Heart Failure: None ? ? ?Chief Complaint  ? ?Shortness of breath and chest pain ? ?Patient Profile  ? ?71 yo male with acute PE and new LV dysfunction ? ?HPI  ? ?[name] is a 71 y.o. male who is being seen today for the evaluation of PE and LV dysfunction at the request of Dr. Halford Chessman. This is a pleasant 71 yo male with history of diabetes, hypertension, autoimmune myositis and PTSD.  He is also on chronic testosterone injection.  He has had about a week of chest and abdominal discomfort and has had lower extremity edema.  He underwent CT coronary angiography which showed a large saddle pulmonary embolus with right heart strain and was found to have a right popliteal vein DVT.  He was started on heparin and echocardiogram was performed today which I personally reviewed showing an LVEF of about 35 to 40% with regional wall motion abnormalities including severe hypokinesis of the inferoseptal, inferior, inferolateral and apical walls.  The RV was not well visualized however measures of RV tricuspid systolic annular plane systolic excursion was normal.  He was noted to have mild aortic dilatation measuring 41 mm.  Of note troponins were elevated as well at 852, 1507 and then 1450, which was initially ascribed to his pulmonary embolus, but is more likely due to ACS. He was sedated earlier today and currently was not able to participate in an exam. I discussed his history with his wife at the bedside. ? ?PMHx  ? ?Past Medical History:  ?Diagnosis Date  ? Autoimmune disease ([place])   ? Bronchitis   ? Chronic hiccups   ? Diabetes mellitus without complication ([place])   ? GERD (gastroesophageal reflux disease)   ? Hypothyroidism   ? Joint pain   ? Necrotizing myopathy   ? PTSD (post-traumatic stress disorder)   ? ? ?Past Surgical History:  ?Procedure Laterality Date  ? BIOPSY SHOULDER    ?  Left  ? IR ANGIOGRAM PULMONARY BILATERAL SELECTIVE  07/22/2021  ? IR THROMBECT VENO MECH MOD SED  07/22/2021  ? IR US GUIDE VASC ACCESS LEFT  07/22/2021  ? UPPER LEG SOFT TISSUE BIOPSY Right   ? ? ?FAMHx  ? ?Family History  ?Problem Relation Age of Onset  ? Hypertension Mother   ? Diabetes Mother   ? Arthritis Mother   ? Depression Mother   ? Heart attack Father   ? Hypertension Father   ? ? ?SOCHx  ? ? reports that he has never smoked. He has never used smokeless tobacco. He reports that he does not drink alcohol and does not use drugs. ? ?Outpatient Medications  ? ?No current facility-administered medications on file prior to encounter.  ? ?Current Outpatient Medications on File Prior to Encounter  ?Medication Sig Dispense Refill  ? acarbose (PRECOSE) 100 MG tablet Take 100 mg by mouth 3 (three) times daily with meals.    ? baclofen (LIORESAL) 20 MG tablet Take 20 mg by mouth 2 (two) times daily.    ? calcium carbonate (OS-CAL) 600 MG TABS tablet Take 600 mg by mouth 2 (two) times daily with a meal.    ? folic acid (FOLVITE) 1 MG tablet Take 1 mg by mouth daily.    ? furosemide (LASIX) 20 MG tablet Take 20 mg by mouth daily.    ? glipiZIDE (GLUCOTROL XL) 10 MG 24 hr tablet Take 10  mg by mouth 2 (two) times daily.     ? hydrOXYzine (ATARAX/VISTARIL) 25 MG tablet TAKE 1 TABLET (25 MG TOTAL) BY MOUTH 3 (THREE) TIMES DAILY AS NEEDED. FOR ITCHING (Patient taking differently: Take 25 mg by mouth 3 (three) times daily as needed for itching. For itching) 270 tablet 0  ? insulin glargine (LANTUS) 100 UNIT/ML injection Inject 0.46-0.56 mLs (46-56 Units total) into the skin at bedtime. For now take 30 units twice a day and then increase to your usual dose as your oral intake improves. Increase both doses by 2units every day. Check your blood sugars at home three times daily. Call your doctor if sugar level is more than 300 or less than 80. (Patient taking differently: Inject 42-44 Units into the skin 2 (two) times daily. Check  your blood sugars at home three times daily. Call your doctor if sugar level is more than 300 or less than 80.) 10 mL 11  ? Levothyroxine Sodium 88 MCG CAPS Take 88 mcg by mouth daily.    ? losartan (COZAAR) 50 MG tablet Take 25 mg by mouth daily. Take 1/2 tablet    ? metFORMIN (GLUCOPHAGE) 1000 MG tablet Take 1,000 mg by mouth 2 (two) times daily.    ? methotrexate (RHEUMATREX) 5 MG tablet Take 2 tablets (10 mg total) by mouth once a week. Caution: Chemotherapy. Protect from light. Take on fridays per patient. ? ?OKAY TO RESUME FROM 10/08/14. (Patient taking differently: Take 30 mg by mouth once a week. Caution: Chemotherapy. Protect from light. Take on fridays per patient. 6 Tabs once a week) 4 tablet 0  ? nystatin (MYCOSTATIN/NYSTOP) powder SMARTSIG:1 Application Topical 2-3 Times Daily    ? pantoprazole (PROTONIX) 40 MG tablet TAKE 1 TABLET BY MOUTH EVERY DAY (Patient taking differently: Take 40 mg by mouth daily.) 30 tablet 0  ? polyvinyl alcohol (LIQUIFILM TEARS) 1.4 % ophthalmic solution 1 drop as needed for dry eyes.    ? predniSONE (DELTASONE) 5 MG tablet Take 15 mg by mouth daily with breakfast.    ? sertraline (ZOLOFT) 100 MG tablet Take 100 mg by mouth daily.    ? tadalafil (CIALIS) 20 MG tablet Take 20 mg by mouth daily.    ? testosterone cypionate (DEPOTESTOSTERONE CYPIONATE) 200 MG/ML injection Inject 0.5 mg into the muscle every 7 (seven) days.    ? traMADol (ULTRAM) 50 MG tablet Take 100 mg by mouth 3 (three) times daily as needed for moderate pain.    ? triamcinolone cream (KENALOG) 0.1 % Use 1 application twice daily as needed to red itchy areas below the face 453.6 g 0  ? DULoxetine (CYMBALTA) 30 MG capsule Take 30 mg by mouth daily. (Patient not taking: Reported on 07/21/2021)    ? empagliflozin (JARDIANCE) 25 MG TABS tablet Take 10 mg by mouth. Take 1/2 tab (Patient not taking: Reported on 07/21/2021)    ? gabapentin (NEURONTIN) 100 MG capsule Take 1 capsule (100 mg total) by mouth 3 (three) times  daily. (Patient not taking: Reported on 07/21/2021) 90 capsule 2  ? ? ?Inpatient Medications  ?  ?Scheduled Meds: ? baclofen  20 mg Oral BID  ? Chlorhexidine Gluconate Cloth  6 each Topical Daily  ? insulin aspart  0-15 Units Subcutaneous Q4H  ? insulin glargine-yfgn  15 Units Subcutaneous BID  ? levothyroxine  88 mcg Oral Daily  ? pantoprazole  40 mg Oral Daily  ? predniSONE  15 mg Oral Q breakfast  ? sodium chloride flush  3 mL Intravenous Q12H  ? ? ?Continuous Infusions: ? sodium chloride    ? heparin 950 Units/hr (07/23/21 1400)  ? lactated ringers 100 mL/hr at 07/23/21 1511  ? ? ?PRN Meds: ?acetaminophen **OR** acetaminophen, haloperidol lactate, lip balm, LORazepam, polyvinyl alcohol, traMADol  ? ?ALLERGIES  ? ?Allergies  ?Allergen Reactions  ? Benazepril Cough  ? Gabapentin Swelling  ? Hydrocodone   ? Shellfish Allergy   ? ? ?ROS  ? ?Pertinent items noted in HPI and remainder of comprehensive ROS otherwise negative. ? ?Vitals  ? ?Vitals:  ? 07/23/21 1130 07/23/21 1200 07/23/21 1300 07/23/21 1400  ?BP:  108/83 135/86 134/87  ?Pulse: 77 99 (!) 101 (!) 101  ?Resp: 20 (!) 27 (!) 22 (!) 24  ?Temp:      ?TempSrc:      ?SpO2: 95% 92% 95% 96%  ?Weight:      ?Height:      ? ? ?Intake/Output Summary (Last 24 hours) at 07/23/2021 1553 ?Last data filed at 07/23/2021 1301 ?Gross per 24 hour  ?Intake 3392.94 ml  ?Output 4055 ml  ?Net -662.06 ml  ? ?Filed Weights  ? 07/21/21 1740 07/21/21 1937  ?Weight: 94.8 kg 94.8 kg  ? ? ?Physical Exam  ? ?General appearance: somnolent, arouses to sternal rub ?Neck: no carotid bruit, no JVD, and thyroid not enlarged, symmetric, no tenderness/mass/nodules ?Lungs: diminished breath sounds bibasilar ?Heart: regular tachycardia ?Abdomen: soft, non-tender; bowel sounds normal; no masses,  no organomegaly ?Extremities: edema 1+ bilateral sockline edema ?Pulses: 2+ and symmetric ?Skin: Skin color, texture, turgor normal. No rashes or lesions ?Neurologic: Mental status: somnolent, aroused only to  sternal rub ( had been sedated earlier) ?Psych: Cannot assess ? ?Labs  ? ?Results for orders placed or performed during the hospital encounter of 07/21/21 (from the past 48 hour(s))  ?CBG monitoring,

## 2021-07-23 NOTE — Progress Notes (Signed)
Echocardiogram ?2D Echocardiogram has been performed. ? [name] ?07/23/2021, 9:40 AM ?

## 2021-07-23 NOTE — Progress Notes (Signed)
ANTICOAGULATION CONSULT NOTE ?Pharmacy Consult for Heparin ?Indication: pulmonary embolus s/p thrombectomy ?Brief A/P: Heparin level supratherapeutic Decrease Heparin rate ? ?Allergies  ?Allergen Reactions  ? Benazepril Cough  ? Gabapentin Swelling  ? Hydrocodone   ? Shellfish Allergy   ? ? ?Patient Measurements: ?Height: '5\' 8"'$  (172.7 cm) ?Weight: 94.8 kg (208 lb 15.9 oz) ?IBW/kg (Calculated) : 68.4 ?Heparin Dosing Weight: 88.3 kg ? ?Vital Signs: ?Temp: 98 ?F (36.7 ?C) (03/26 3662) ?Temp Source: Axillary (03/26 0708) ?BP: 107/93 (03/26 0800) ?Pulse Rate: 76 (03/26 0830) ? ?Labs: ?Recent Labs  ?  07/21/21 ?1749 07/21/21 ?1800 07/21/21 ?2010 07/21/21 ?2032 07/21/21 ?2032 07/22/21 ?0253 07/22/21 ?9476 07/22/21 ?2321 07/23/21 ?0118 07/23/21 ?0810  ?HGB  --   --   --  13.7   < > 13.3  --   --  13.9 12.7*  ?HCT  --   --   --  41.0   < > 41.2  --   --  41.0 37.8*  ?PLT  --   --   --  130*  --  129*  --   --   --  120*  ?LABPROT  --   --   --  14.5  --   --   --   --   --   --   ?INR  --   --   --  1.1  --   --   --   --   --   --   ?HEPARINUNFRC  --   --   --   --   --   --  1.05* >1.10*  --  0.95*  ?CREATININE  --  1.63*  --   --   --  1.35*  --   --   --   --   ?TROPONINIHS 852*  --  1,507*  --   --  1,450*  --   --   --   --   ? < > = values in this interval not displayed.  ? ? ?Estimated Creatinine Clearance: 55.3 mL/min (A) (by C-G formula based on SCr of 1.35 mg/dL (H)). ? ? ?Assessment: ?71 y.o. male with DVT/PE s/p thrombectomy for heparin.  Heparin infused during procedure, and heparin 2000 units IV bolus given as well.   ? ?Heparin level 0.95 after reduction to 1250 units/hr.  ? ?Goal of Therapy:  ?Heparin level 0.3-0.7 units/ml ?Monitor platelets by anticoagulation protocol: Yes ?  ?Plan:  ?Decrease Heparin 950 units/hr ?Check heparin level in 8 hours. ?Follow-up ability to take oral ? ?Sloan Leiter, PharmD ?Clinical Pharmacist ?Please refer to [facility] for [place] numbers ?07/23/2021 ? ? ? ? ? ? ? ?

## 2021-07-23 NOTE — Progress Notes (Signed)
ANTICOAGULATION CONSULT NOTE ?Pharmacy Consult for Heparin ?Indication: pulmonary embolus s/p thrombectomy ?  ? ?Allergies  ?Allergen Reactions  ? Benazepril Cough  ? Gabapentin Swelling  ? Hydrocodone   ? Shellfish Allergy   ? ? ?Patient Measurements: ?Height: '5\' 8"'$  (172.7 cm) ?Weight: 94.8 kg (208 lb 15.9 oz) ?IBW/kg (Calculated) : 68.4 ?Heparin Dosing Weight: 88 kg ? ?Vital Signs: ?Temp: 100.7 ?F (38.2 ?C) (03/26 1500) ?Temp Source: Oral (03/26 1500) ?BP: 103/65 (03/26 1822) ?Pulse Rate: 102 (03/26 1822) ? ?Labs: ?Recent Labs  ?  07/21/21 ?1749 07/21/21 ?1800 07/21/21 ?2010 07/21/21 ?2032 07/21/21 ?2032 07/22/21 ?0253 07/22/21 ?9702 07/22/21 ?2321 07/23/21 ?0118 07/23/21 ?6378 07/23/21 ?1841  ?HGB  --   --   --  13.7   < > 13.3  --   --  13.9 12.7*  --   ?HCT  --   --   --  41.0   < > 41.2  --   --  41.0 37.8*  --   ?PLT  --   --   --  130*  --  129*  --   --   --  120*  --   ?LABPROT  --   --   --  14.5  --   --   --   --   --   --   --   ?INR  --   --   --  1.1  --   --   --   --   --   --   --   ?HEPARINUNFRC  --   --   --   --   --   --    < > >1.10*  --  0.95* 0.28*  ?CREATININE  --  1.63*  --   --   --  1.35*  --   --   --  1.08  --   ?TROPONINIHS 852*  --  1,507*  --   --  1,450*  --   --   --   --   --   ? < > = values in this interval not displayed.  ? ? ? ?Estimated Creatinine Clearance: 69.1 mL/min (by C-G formula based on SCr of 1.08 mg/dL). ? ? ?Assessment: ?71 y.o. male with acute saddle PE with RV strain s/p thrombectomy 3/25. Also with occlusive and non-occlusive RLE DVTs. No anticoagulation prior to admission. Pharmacy consulted for heparin.   ? ?Heparin level 0.28 is subtherapeutic on 950 units/hr (decreased from 1250 units/hr).  ? ?Goal of Therapy:  ?Heparin level 0.3-0.7 units/ml ?Monitor platelets by anticoagulation protocol: Yes ?  ?Plan:  ?Increase Heparin to 1050 units/hr ?Monitor daily heparin level, CBC ?Monitor for signs/symptoms of bleeding  ? ? ? ?Benetta Spar, PharmD, BCPS,  BCCP ?Clinical Pharmacist ? ?Please check AMION for all [place] phone numbers ?After 10:00 PM, call [place] [phone] ? ? ?

## 2021-07-23 NOTE — Progress Notes (Signed)
eLink Physician-Brief Progress Note ?Patient Name: [name] ?DOB: 1950/10/30 ?MRN: [id number] ? ? ?Date of Service ? 07/23/2021  ?HPI/Events of Note ? Nursing reports R groin hematoma. Heparin IV infusion for PE now off. Nursing states that the patient has good popliteal, dorsalis pedis and posterior tibial pulses in R leg. Nursing requests that PCCM ground team evaluate the hematoma at bedside.   ?eICU Interventions ? Plan: ?Send AM labs now. ?Will request PCCM ground team evaluate hematoma as requested by nursing.   ? ? ? ?Intervention Category ?Major Interventions: Other: ? ?Derenda Giddings Cornelia Copa ?07/23/2021, 11:42 PM ?

## 2021-07-24 ENCOUNTER — Inpatient Hospital Stay (HOSPITAL_COMMUNITY): Payer: Medicare Other

## 2021-07-24 DIAGNOSIS — I214 Non-ST elevation (NSTEMI) myocardial infarction: Secondary | ICD-10-CM

## 2021-07-24 DIAGNOSIS — R1032 Left lower quadrant pain: Secondary | ICD-10-CM

## 2021-07-24 DIAGNOSIS — I2602 Saddle embolus of pulmonary artery with acute cor pulmonale: Secondary | ICD-10-CM | POA: Diagnosis not present

## 2021-07-24 DIAGNOSIS — I5041 Acute combined systolic (congestive) and diastolic (congestive) heart failure: Secondary | ICD-10-CM

## 2021-07-24 LAB — GLUCOSE, CAPILLARY
Glucose-Capillary: 120 mg/dL — ABNORMAL HIGH (ref 70–99)
Glucose-Capillary: 153 mg/dL — ABNORMAL HIGH (ref 70–99)
Glucose-Capillary: 239 mg/dL — ABNORMAL HIGH (ref 70–99)
Glucose-Capillary: 335 mg/dL — ABNORMAL HIGH (ref 70–99)
Glucose-Capillary: 352 mg/dL — ABNORMAL HIGH (ref 70–99)
Glucose-Capillary: 67 mg/dL — ABNORMAL LOW (ref 70–99)
Glucose-Capillary: 90 mg/dL (ref 70–99)

## 2021-07-24 LAB — CBC
HCT: 40.1 % (ref 39.0–52.0)
Hemoglobin: 13.3 g/dL (ref 13.0–17.0)
MCH: 31.9 pg (ref 26.0–34.0)
MCHC: 33.2 g/dL (ref 30.0–36.0)
MCV: 96.2 fL (ref 80.0–100.0)
Platelets: 115 10*3/uL — ABNORMAL LOW (ref 150–400)
RBC: 4.17 MIL/uL — ABNORMAL LOW (ref 4.22–5.81)
RDW: 14.1 % (ref 11.5–15.5)
WBC: 8 10*3/uL (ref 4.0–10.5)
nRBC: 0.4 % — ABNORMAL HIGH (ref 0.0–0.2)

## 2021-07-24 LAB — HEPARIN LEVEL (UNFRACTIONATED)
Heparin Unfractionated: 0.1 IU/mL — ABNORMAL LOW (ref 0.30–0.70)
Heparin Unfractionated: 0.23 IU/mL — ABNORMAL LOW (ref 0.30–0.70)
Heparin Unfractionated: 0.33 IU/mL (ref 0.30–0.70)

## 2021-07-24 LAB — COMPREHENSIVE METABOLIC PANEL
ALT: 30 U/L (ref 0–44)
AST: 41 U/L (ref 15–41)
Albumin: 3.2 g/dL — ABNORMAL LOW (ref 3.5–5.0)
Alkaline Phosphatase: 47 U/L (ref 38–126)
Anion gap: 6 (ref 5–15)
BUN: 10 mg/dL (ref 8–23)
CO2: 24 mmol/L (ref 22–32)
Calcium: 9.2 mg/dL (ref 8.9–10.3)
Chloride: 111 mmol/L (ref 98–111)
Creatinine, Ser: 1.15 mg/dL (ref 0.61–1.24)
GFR, Estimated: >60 mL/min (ref >60)
Glucose, Bld: 71 mg/dL (ref 70–99)
Potassium: 3.4 mmol/L — ABNORMAL LOW (ref 3.5–5.1)
Sodium: 141 mmol/L (ref 135–145)
Total Bilirubin: 1 mg/dL (ref 0.3–1.2)
Total Protein: 5.6 g/dL — ABNORMAL LOW (ref 6.5–8.1)

## 2021-07-24 LAB — LIPID PANEL
Cholesterol: 182 mg/dL (ref 0–200)
HDL: 41 mg/dL (ref >40)
LDL Cholesterol: 115 mg/dL — ABNORMAL HIGH (ref 0–99)
Total CHOL/HDL Ratio: 4.4 RATIO
Triglycerides: 132 mg/dL (ref <150)
VLDL: 26 mg/dL (ref 0–40)

## 2021-07-24 LAB — BRAIN NATRIURETIC PEPTIDE: B Natriuretic Peptide: 119.6 pg/mL — ABNORMAL HIGH (ref 0.0–100.0)

## 2021-07-24 LAB — CK: Total CK: 606 U/L — ABNORMAL HIGH (ref 49–397)

## 2021-07-24 MED ORDER — OXYCODONE HCL 5 MG PO TABS
2.5000 mg | ORAL_TABLET | ORAL | Status: DC | PRN
  Administered 2021-07-25: 2.5 mg via ORAL
  Filled 2021-07-24: qty 1

## 2021-07-24 MED ORDER — INSULIN GLARGINE-YFGN 100 UNIT/ML ~~LOC~~ SOLN
10.0000 [IU] | Freq: Two times a day (BID) | SUBCUTANEOUS | Status: DC
  Administered 2021-07-24: 10 [IU] via SUBCUTANEOUS
  Filled 2021-07-24 (×2): qty 0.1

## 2021-07-24 MED ORDER — SODIUM CHLORIDE 0.9 % IV BOLUS: 500.0000 mL | Freq: Once | INTRAVENOUS | Status: DC

## 2021-07-24 MED ORDER — INSULIN ASPART 100 UNIT/ML IJ SOLN
0.0000 [IU] | Freq: Three times a day (TID) | INTRAMUSCULAR | Status: DC
  Administered 2021-07-24: 15 [IU] via SUBCUTANEOUS
  Administered 2021-07-24: 11 [IU] via SUBCUTANEOUS
  Administered 2021-07-25: 8 [IU] via SUBCUTANEOUS
  Administered 2021-07-25: 15 [IU] via SUBCUTANEOUS

## 2021-07-24 MED ORDER — OXYCODONE-ACETAMINOPHEN 5-325 MG PO TABS
1.0000 | ORAL_TABLET | ORAL | Status: DC | PRN
  Administered 2021-07-25 (×2): 1 via ORAL
  Filled 2021-07-24 (×3): qty 1

## 2021-07-24 MED ORDER — OXYCODONE-ACETAMINOPHEN 7.5-325 MG PO TABS: 1.0000 | ORAL_TABLET | ORAL | Status: DC | PRN

## 2021-07-24 MED ORDER — INSULIN GLARGINE-YFGN 100 UNIT/ML ~~LOC~~ SOLN
18.0000 [IU] | Freq: Two times a day (BID) | SUBCUTANEOUS | Status: DC
  Administered 2021-07-24 – 2021-07-25 (×2): 18 [IU] via SUBCUTANEOUS
  Filled 2021-07-24 (×3): qty 0.18

## 2021-07-24 MED ORDER — POTASSIUM CHLORIDE CRYS ER 20 MEQ PO TBCR
40.0000 meq | EXTENDED_RELEASE_TABLET | Freq: Once | ORAL | Status: AC
  Administered 2021-07-24: 40 meq via ORAL
  Filled 2021-07-24: qty 2

## 2021-07-24 MED ORDER — FUROSEMIDE 10 MG/ML IJ SOLN
40.0000 mg | Freq: Once | INTRAMUSCULAR | Status: AC
  Administered 2021-07-24: 40 mg via INTRAVENOUS
  Filled 2021-07-24: qty 4

## 2021-07-24 MED ORDER — ORAL CARE MOUTH RINSE
15.0000 mL | Freq: Two times a day (BID) | OROMUCOSAL | Status: DC
  Administered 2021-07-24 – 2021-07-26 (×4): 15 mL via OROMUCOSAL

## 2021-07-24 MED ORDER — SERTRALINE HCL 100 MG PO TABS
100.0000 mg | ORAL_TABLET | Freq: Every day | ORAL | Status: DC
  Administered 2021-07-24 – 2021-07-27 (×4): 100 mg via ORAL
  Filled 2021-07-24 (×4): qty 1

## 2021-07-24 MED ORDER — CARVEDILOL 3.125 MG PO TABS
3.1250 mg | ORAL_TABLET | Freq: Two times a day (BID) | ORAL | Status: DC
  Administered 2021-07-24 – 2021-07-25 (×2): 3.125 mg via ORAL
  Filled 2021-07-24 (×2): qty 1

## 2021-07-24 NOTE — Progress Notes (Signed)
Left groin pseudoaneurysm evaluation completed. ?Refer to "CV Proc" under chart review to view preliminary results. ? ?07/24/2021 12:11 PM ?Kelby Aline., MHA, RVT, RDCS, RDMS   ?

## 2021-07-24 NOTE — Progress Notes (Signed)
ANTICOAGULATION CONSULT NOTE ?Pharmacy Consult for Heparin ?Indication: pulmonary embolus s/p thrombectomy ?  ? ?Allergies  ?Allergen Reactions  ? Benazepril Cough  ? Gabapentin Swelling  ? Hydrocodone   ? Shellfish Allergy   ? ? ?Patient Measurements: ?Height: '5\' 8"'$  (172.7 cm) ?Weight: 91.2 kg (201 lb 1 oz) ?IBW/kg (Calculated) : 68.4 ?Heparin Dosing Weight: 88 kg ? ?Vital Signs: ?Temp: 100.1 ?F (37.8 ?C) (03/27 1100) ?Temp Source: Oral (03/27 1100) ?BP: 115/79 (03/27 1100) ?Pulse Rate: 120 (03/27 1100) ? ?Labs: ?Recent Labs  ?  07/21/21 ?1749 07/21/21 ?1800 07/21/21 ?2010 07/21/21 ?2032 07/21/21 ?2032 07/22/21 ?0253 07/22/21 ?5625 07/23/21 ?0118 07/23/21 ?6389 07/23/21 ?1841 07/24/21 ?0022 07/24/21 ?1108  ?HGB  --   --   --  13.7   < > 13.3  --  13.9 12.7*  --  13.3  --   ?HCT  --   --   --  41.0   < > 41.2  --  41.0 37.8*  --  40.1  --   ?PLT  --   --   --  130*   < > 129*  --   --  120*  --  115*  --   ?LABPROT  --   --   --  14.5  --   --   --   --   --   --   --   --   ?INR  --   --   --  1.1  --   --   --   --   --   --   --   --   ?HEPARINUNFRC  --   --   --   --   --   --    < >  --  0.95* 0.28* 0.10* 0.23*  ?CREATININE  --    < >  --   --   --  1.35*  --   --  1.08  --  1.15  --   ?CKTOTAL  --   --   --   --   --   --   --   --   --   --  606*  --   ?TROPONINIHS 852*  --  1,507*  --   --  1,450*  --   --   --   --   --   --   ? < > = values in this interval not displayed.  ? ? ? ?Estimated Creatinine Clearance: 63.6 mL/min (by C-G formula based on SCr of 1.15 mg/dL). ? ? ?Assessment: ?71 y.o. male with acute saddle PE with RV strain s/p thrombectomy 3/25. Also with occlusive and non-occlusive RLE DVTs. No anticoagulation prior to admission. Pharmacy consulted for heparin.   ? ?Heparin drip was held for a bit overnight due to concern for groin hematoma, resumed at 0220 per RN. Heparin level 0.23 this morning is subtherapeutic on 1050 units/hr. No hematoma seen on exam this morning per CCM and ok to adjust  heparin as needed for therapeutic levels. IR to evaluate site to be sure. Hg stable, plt down a bit to 115. No bleeding or issues with infusion per discussion with RN. ? ?Goal of Therapy:  ?Heparin level 0.3-0.7 units/ml ?Monitor platelets by anticoagulation protocol: Yes ?  ?Plan:  ?Increase heparin infusion to 1250 units/hr ?Check 6hr heparin level ?Monitor daily CBC, signs/symptoms of bleeding  ? ? ?Arturo Morton, PharmD, BCPS ?Please check AMION for all [place] contact numbers ?  Clinical Pharmacist ?07/24/2021 12:06 PM ?

## 2021-07-24 NOTE — Progress Notes (Signed)
ANTICOAGULATION CONSULT NOTE ?Pharmacy Consult for Heparin ?Indication: pulmonary embolus s/p thrombectomy ?  ? ?Allergies  ?Allergen Reactions  ? Benazepril Cough  ? Gabapentin Swelling  ? Hydrocodone   ? Shellfish Allergy   ? ? ?Patient Measurements: ?Height: '5\' 8"'$  (172.7 cm) ?Weight: 91.2 kg (201 lb 1 oz) ?IBW/kg (Calculated) : 68.4 ?Heparin Dosing Weight: 88 kg ? ?Vital Signs: ?Temp: 98.3 ?F (36.8 ?C) (03/27 1700) ?Temp Source: Oral (03/27 1700) ?BP: 138/77 (03/27 1900) ?Pulse Rate: 113 (03/27 1900) ? ?Labs: ?Recent Labs  ?  07/21/21 ?2010 07/21/21 ?2032 07/21/21 ?2032 07/22/21 ?0253 07/22/21 ?2094 07/23/21 ?0118 07/23/21 ?7096 07/23/21 ?1841 07/24/21 ?0022 07/24/21 ?1108 07/24/21 ?1736  ?HGB  --  13.7   < > 13.3  --  13.9 12.7*  --  13.3  --   --   ?HCT  --  41.0   < > 41.2  --  41.0 37.8*  --  40.1  --   --   ?PLT  --  130*   < > 129*  --   --  120*  --  115*  --   --   ?LABPROT  --  14.5  --   --   --   --   --   --   --   --   --   ?INR  --  1.1  --   --   --   --   --   --   --   --   --   ?HEPARINUNFRC  --   --   --   --    < >  --  0.95*   < > 0.10* 0.23* 0.33  ?CREATININE  --   --   --  1.35*  --   --  1.08  --  1.15  --   --   ?CKTOTAL  --   --   --   --   --   --   --   --  606*  --   --   ?TROPONINIHS 1,507*  --   --  1,450*  --   --   --   --   --   --   --   ? < > = values in this interval not displayed.  ? ? ? ?Estimated Creatinine Clearance: 63.6 mL/min (by C-G formula based on SCr of 1.15 mg/dL). ? ? ?Assessment: ?71 y.o. male with acute saddle PE with RV strain s/p thrombectomy 3/25. Also with occlusive and non-occlusive RLE DVTs. No anticoagulation prior to admission. Pharmacy consulted for heparin.   ? ?Heparin drip was held for a bit overnight due to concern for groin hematoma, resumed at 0220 per RN. Heparin level 0.23 this morning is subtherapeutic on 1050 units/hr. No hematoma seen on exam this morning per CCM and ok to adjust heparin as needed for therapeutic levels. IR to evaluate site  to be sure. Hg stable, plt down a bit to 115. No bleeding or issues with infusion per discussion with RN. ? ? ?PM update: HL 0.33 and within goal range. Continue current rate and get confimatory heparin level in 6 hours.  ? ?Goal of Therapy:  ?Heparin level 0.3-0.7 units/ml ?Monitor platelets by anticoagulation protocol: Yes ?  ?Plan:  ?Continue heparin infusion at 1250 units/hr ?Check 6hr confirmatory heparin level ?Monitor daily CBC, signs/symptoms of bleeding  ? ? ?Cynthis Purington A. Levada Dy, PharmD, BCPS, FNKF ?Clinical Pharmacist ?[place] ?Please utilize Amion for appropriate phone number to  reach the unit pharmacist ([place]) ? ?07/24/2021 7:25 PM ?

## 2021-07-24 NOTE — Progress Notes (Signed)
? ?NAME:  [name], MRN:  [id number], DOB:  1950-08-28, LOS: 2 ?ADMISSION DATE:  07/21/2021, CONSULTATION DATE:  07/22/21 ?REFERRING MD:  Dr Tana Coast, CHIEF COMPLAINT:  Dyspnea, hypoxemia  ? 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  He is on chronic testosterone injection therapy.  He presented 3/24 with some generalized weakness (question subacute), about 1 week of chest and abdominal discomfort, acute on chronic right lower extremity pain and dyspnea.  He also had some confusion was noted to be hypoglycemic. ?His evaluation revealed a large saddle pulmonary embolus with hypoxemia, tachycardia and evidence for right heart strain: RV/LV ratio 1.35, lactic acid 4.9 > 4.1, troponin 1507 > 1450.  Lower extremity Doppler ultrasound showed right popliteal vein DVT, none on the left.  Treated with heparin infusion, IV fluid resuscitation.  Acute on chronic renal insufficiency on presentation, improved to previous baseline by this morning 3/25 (creatinine 1.35).  Echocardiogram is pending.  ? ?Remains tachycardic, requiring 5 L/min.  ? ?Pertinent  Medical History  ? ?Past Medical History:  ?Diagnosis Date  ? Autoimmune disease ([place])   ? Bronchitis   ? Chronic hiccups   ? Diabetes mellitus without complication ([place])   ? GERD (gastroesophageal reflux disease)   ? Hypothyroidism   ? Joint pain   ? Necrotizing myopathy   ? PTSD (post-traumatic stress disorder)   ? ? ?Significant Hospital Events: ?Including procedures, antibiotic start and stop dates in addition to other pertinent events   ?CT-PA 3/24 >> saddle pulmonary embolism with RV/LV ratio 1.35 ?CT abdomen pelvis 3/24 >> no abnormality ?Lower extremity Doppler ultrasound 3/24 >> right popliteal DVT, left clear ? ?Interim History / Subjective:  ? ?Patient appears uncomfortable this morning. Endorses left shoulder pain and right groin pain. Endorse mild  SOB. ? ?Objective   ?Blood pressure 138/79, pulse (!) 134, temperature 99.4 ?F (37.4 ?C), temperature source Axillary, resp. rate (!) 25, height '5\' 8"'$  (1.727 m), weight 91.2 kg, SpO2 94 %. ?   ?   ? ?Intake/Output Summary (Last 24 hours) at 07/24/2021 0731 ?Last data filed at 07/24/2021 6812 ?Gross per 24 hour  ?Intake 1575.12 ml  ?Output 1450 ml  ?Net 125.12 ml  ? ? ?Filed Weights  ? 07/21/21 1740 07/21/21 1937 07/24/21 0600  ?Weight: 94.8 kg 94.8 kg 91.2 kg  ? ? ?Examination: ?General: Appears uncomfortable.  ?HENT: Oropharynx dry, pupils equal.  No stridor ?Lungs: Clear bilaterally ?Cardiovascular: Tachycardic, no murmur ?Abdomen: soft, bowel sound present ?Extremities: +1 RLE edema. Left should tender to palpation. No warmth or erythema.  ?Neuro: Awake, alert, and oriented ?GU: Right groin tender to palpation.  ? ?Resolved Hospital Problem list   ?Acute on chronic renal failure ? ?Assessment & Plan:  ?Acute saddle pulmonary embolism, right popliteal DVT.  Evidence for right heart strain based on RV/LV ratio, lactic acidosis, elevated troponin.   ?Triggered by immobility 2/2 myositis. His autoimmune disease and testosterone injection can also contribute.  ?Status post thrombectomy. ?-Continue heparin infusion until LHC ?-Consider DOAC when tolerate p.o. kidney function should be adequate for DOAC.  May need lifelong treatment given ongoing autoimmune disease.  Can stop his testosterone injection to reduce risk. ? ?Tachycardic ?Fever ?Unknown source. WBC is within normal limit ?No obvious source of infection  ?CXR appears slightly more wet. Will diuresed with 1 dose of Lasix 40 mg ? ?R. Groin pain ?Questionable hematoma or aneurysm. Hgb stable since yesterday ?-IR will  evaluate patient today ? ?Acute HFrEF ?CAD ?EF 35-40%. Suspect he had a NSTEMI ?-Cardiology plan for Left and Right heart cath this week ?-Started on ASA and Lipitor ?-Diuresed with one dose of Lasix 40 mg today ?-GDMT per Cardiology ? ?Anxiety  disorder, history of PTSD   ?-Continue home Zoloft ? ?DM with hyperglycemia ?CBG at goal ?-Glipizide and metformin are on hold, was hypoglycemic on presentation ?-Continue Semglee15 units twice daily ?-Change sliding scale to Q4h while NPO. ? ?Mild thrombocytopenia ?-Follow-up CBC ? ?Hypertension ?-Losartan on hold.  Blood pressure remains in good range ? ?Urinary retention  ?Foley placed yesterday for multiple high readings from bladder scan.  Patient is not on BPH meds at home. ?-Voiding trial today ? ?Best Practice (right click and "Reselect all SmartList Selections" daily)  ? ?Diet/type: NPO ?DVT prophylaxis: systemic heparin ?GI prophylaxis: PPI ?Lines: NA ?Foley:  will remove ?Code Status:  full code ?Last date of multidisciplinary goals of care discussion: updated wife ?Labs   ?CBC: ?Recent Labs  ?Lab 07/21/21 ?2032 07/22/21 ?0253 07/23/21 ?0118 07/23/21 ?2725 07/24/21 ?0022  ?WBC 8.8 8.0  --  8.4 8.0  ?NEUTROABS 7.3  --   --   --   --   ?HGB 13.7 13.3 13.9 12.7* 13.3  ?HCT 41.0 41.2 41.0 37.8* 40.1  ?MCV 94.5 98.6  --  94.0 96.2  ?PLT 130* 129*  --  120* 115*  ? ? ? ?Basic Metabolic Panel: ?Recent Labs  ?Lab 07/21/21 ?1800 07/22/21 ?0253 07/23/21 ?0118 07/23/21 ?3664 07/24/21 ?0022  ?NA 137 140 146* 145 141  ?K 5.4* 5.1 4.8 4.2 3.4*  ?CL 99 107  --  113* 111  ?CO2 28 26  --  24 24  ?GLUCOSE 350* 131*  --  149* 71  ?BUN 20 13  --  12 10  ?CREATININE 1.63* 1.35*  --  1.08 1.15  ?CALCIUM 10.0 9.8  --  9.2 9.2  ? ? ?GFR: ?Estimated Creatinine Clearance: 63.6 mL/min (by C-G formula based on SCr of 1.15 mg/dL). ?Recent Labs  ?Lab 07/21/21 ?1757 07/21/21 ?2032 07/21/21 ?2038 07/22/21 ?0253 07/23/21 ?4034 07/24/21 ?0022  ?WBC  --  8.8  --  8.0 8.4 8.0  ?LATICACIDVEN 4.9*  --  4.1*  --   --   --   ? ? ? ?Liver Function Tests: ?Recent Labs  ?Lab 07/21/21 ?1800 07/22/21 ?0253 07/24/21 ?0022  ?AST 42* 32 41  ?ALT '27 25 30  '$ ?ALKPHOS 47 45 47  ?BILITOT 1.2 0.8 1.0  ?PROT 6.4* 5.3* 5.6*  ?ALBUMIN 3.7 3.1* 3.2*  ? ? ?Recent  Labs  ?Lab 07/21/21 ?1800  ?LIPASE 27  ? ? ?No results for input(s): AMMONIA in the last 168 hours. ? ?ABG ?   ?Component Value Date/Time  ? PHART 7.458 (H) 07/23/2021 0118  ? PCO2ART 35.1 07/23/2021 0118  ? PO2ART 79 (L) 07/23/2021 0118  ? HCO3 25.0 07/23/2021 0118  ? TCO2 26 07/23/2021 0118  ? O2SAT 97 07/23/2021 0118  ? ?  ? ?Coagulation Profile: ?Recent Labs  ?Lab 07/21/21 ?2032  ?INR 1.1  ? ? ? ?Cardiac Enzymes: ?Recent Labs  ?Lab 07/24/21 ?0022  ?CKTOTAL 606*  ? ? ?HbA1C: ?Hemoglobin A1C  ?Date/Time Value Ref Range Status  ?08/27/2019 12:00 AM 9.2  Final  ?  Comment:  ?  [facility]  ?12/04/2018 12:00 AM 9.9  Final  ? ? ?CBG: ?Recent Labs  ?Lab 07/23/21 ?1821 07/23/21 ?1926 07/24/21 ?0016 07/24/21 ?7425 07/24/21 ?0317  ?GLUCAP 116* 92  67* 120* 153*  ? ? ? ?Review of Systems:   ?As per HPI ? ?Past Medical History:  ?He,  has a past medical history of Autoimmune disease ([place]), Bronchitis, Chronic hiccups, Diabetes mellitus without complication ([place]), GERD (gastroesophageal reflux disease), Hypothyroidism, Joint pain, Necrotizing myopathy, and PTSD (post-traumatic stress disorder).  ? ?Surgical History:  ? ?Past Surgical History:  ?Procedure Laterality Date  ? BIOPSY SHOULDER    ? Left  ? IR ANGIOGRAM PULMONARY BILATERAL SELECTIVE  07/22/2021  ? IR THROMBECT VENO MECH MOD SED  07/22/2021  ? IR US GUIDE VASC ACCESS LEFT  07/22/2021  ? UPPER LEG SOFT TISSUE BIOPSY Right   ?  ? ?Social History:  ? reports that he has never smoked. He has never used smokeless tobacco. He reports that he does not drink alcohol and does not use drugs.  ? ?Family History:  ?His family history includes Arthritis in his mother; Depression in his mother; Diabetes in his mother; Heart attack in his father; Hypertension in his father and mother.  ? ?Allergies ?Allergies  ?Allergen Reactions  ? Benazepril Cough  ? Gabapentin Swelling  ? Hydrocodone   ? Shellfish Allergy   ?  ? ?Home Medications  ?Prior to Admission medications   ?Medication Sig  Start Date End Date Taking? Authorizing Provider  ?acarbose (PRECOSE) 100 MG tablet Take 100 mg by mouth 3 (three) times daily with meals.   Yes [provider]  ?baclofen (LIORESAL) 20 MG tablet Take 20 mg by

## 2021-07-24 NOTE — Progress Notes (Signed)
eLink Physician-Brief Progress Note ?Patient Name: [name] ?DOB: [birth year] ?MRN: [id number] ? ? ?Date of Service ? 07/24/2021  ?HPI/Events of Note ? Hgb = 13.3 which is stable.   ?eICU Interventions ? Continue present management.  ? ? ? ?Intervention Category ?Major Interventions: Other: ? ?Antasia Haider Cornelia Copa ?07/24/2021, 1:26 AM ?

## 2021-07-24 NOTE — Progress Notes (Signed)
Inpatient Diabetes Program Recommendations ? ?AACE/ADA: New Consensus Statement on Inpatient Glycemic Control (2015) ? ?Target Ranges:  Prepandial:   less than 140 mg/dL ?     Peak postprandial:   less than 180 mg/dL (1-2 hours) ?     Critically ill patients:  140 - 180 mg/dL  ? ?Lab Results  ?Component Value Date  ? GLUCAP 239 (H) 07/24/2021  ? HGBA1C 9.2 08/27/2019  ? ? ?Review of Glycemic Control ? ?Diabetes history: DM2 ?Outpatient Diabetes medications: glipizide 10 mg BID, Lantus 44 units in am and 42 units QPM, metformin 1000 mg BID, Prednisone 15 mg QD ?Current orders for Inpatient glycemic control: Semglee 10 mg BID, Novolog 0-15 units Q4H ? ?HgbA1C - 9.2% ?Post-prandials elevated - needs meal coverage insulin ? ?Inpatient Diabetes Program Recommendations:   ? ?Add Novolog 3 units TID with meals if eating > 50% meal. ? ?Follow glucose trends. ? ?Thank you. ?Lorenda Peck, RD, LDN, CDE ?Inpatient Diabetes Coordinator ?[phone]  ? ? ? ? ?

## 2021-07-24 NOTE — Evaluation (Signed)
Physical Therapy Evaluation ?Patient Details ?Name: [name] ?MRN: [id number] ?DOB: November 26, 1950 ?Today's Date: 07/24/2021 ? ?History of Present Illness ? 71 y.o. male presents to ED 3/24 with complaints of 2 bouts of increased confuseion with low glucose, and R LE pain. In ED , BP in 39-767H systolic and HR in 419-379K CT PE study showed saddle pulmonary embolus with right heart strain and RV to LV ratio of 1.35 consistent with at least submassive PE. Venous Dopplers showed occlusive right popliteal DVT s/p Bilateral pulmonary angiogram and thrombectomy 07/22/21  PMH: diabetes, hypertension, anemia, hypothyroidism, BPH, GERD, PTSD, autoimmune myositis presenting with weakness, pain, confusion.  ?Clinical Impression ? PTA pt living with wife in 2 story home with 3 steps to enter and bed and bath upstairs. Pt reports using cane or Rollator for household ambulation and requires wheelchair for community mobility. Pt is limited in safe mobility today by decreased cognition in terms of decreased awareness of his deficits and safety. Pt has R LE pain secondary to DVT and L groin pain s/p angiogram femoral access. RN request assessment be limited to bed mobility until cleared by IR for L groin hematoma. PT currently recommending SNF level rehab at discharge due to increased pain in bilateral LE and decreased mobility prior to admission. PT will continue to follow acutely.   ?   ? ?Recommendations for follow up therapy are one component of a multi-disciplinary discharge planning process, led by the attending physician.  Recommendations may be updated based on patient status, additional functional criteria and insurance authorization. ? ?Follow Up Recommendations Skilled nursing-short term rehab (<3 hours/day) ? ?  ?Assistance Recommended at Discharge Frequent or constant Supervision/Assistance  ?Patient can return home with the following ? A lot of help with walking and/or transfers;A lot of help with  bathing/dressing/bathroom;Assistance with cooking/housework;Direct supervision/assist for medications management;Direct supervision/assist for financial management;Assist for transportation;Help with stairs or ramp for entrance ? ?  ?Equipment Recommendations BSC/3in1  ?Recommendations for Other Services ? OT consult  ?  ?Functional Status Assessment Patient has had a recent decline in their functional status and demonstrates the ability to make significant improvements in function in a reasonable and predictable amount of time.  ? ?  ?Precautions / Restrictions Precautions ?Precautions: Fall ?Restrictions ?Weight Bearing Restrictions: No  ? ?  ? ?Mobility ? Bed Mobility ?  ?  ?  ?  ?  ?  ?  ?General bed mobility comments: RN request defer until IR consult for L groin hematoma ?  ? ? ?  ? ? ? ? ?Pertinent Vitals/Pain Pain Assessment ?Pain Assessment: Faces ?Faces Pain Scale: Hurts even more ?Pain Location: R LE with movement ?Pain Descriptors / Indicators: Grimacing, Guarding, Moaning ?Pain Intervention(s): Limited activity within patient's tolerance, Monitored during session, Repositioned  ? ? ?Home Living Family/patient expects to be discharged to:: Private residence ?Living Arrangements: Spouse/significant other ?Available Help at Discharge: Family;Available 24 hours/day ?Type of Home: House ?Home Access: Stairs to enter ?Entrance Stairs-Rails: None ?Entrance Stairs-Number of Steps: 3 ?Alternate Level Stairs-Number of Steps: 18 ?Home Layout: Two level;Bed/bath upstairs ?Home Equipment: Rollator (4 wheels);Cane - single point;Transport chair;Hand held shower head ?   ?  ?Prior Function Prior Level of Function : Needs assist ?  ?  ?  ?  ?  ?  ?Mobility Comments: uses Rollator or cane for household distances, needs transport chair for community mobility ?ADLs Comments: reports independence with ADLs, requires assist for iADLs ?  ? ? ?   ?Extremity/Trunk Assessment  ?  Upper Extremity Assessment ?Upper Extremity  Assessment: Defer to OT evaluation ?  ? ?Lower Extremity Assessment ?Lower Extremity Assessment: RLE deficits/detail;LLE deficits/detail ?RLE Deficits / Details: R LE edematous, with increased pain with touch and PROM ?RLE Sensation: decreased light touch ?LLE Deficits / Details: ankle ROM WFL, knee and hip ROM limited by pain likely due to L groin pain ?  ? ?   ?Communication  ? Communication: No difficulties  ?Cognition Arousal/Alertness: Awake/alert ?Behavior During Therapy: Flat affect ?Overall Cognitive Status: Impaired/Different from baseline ?Area of Impairment: Following commands, Safety/judgement, Awareness, Problem solving ?  ?  ?  ?  ?  ?  ?  ?  ?  ?  ?  ?Following Commands: Follows multi-step commands inconsistently, Follows one step commands consistently, Follows one step commands with increased time, Follows multi-step commands with increased time ?Safety/Judgement: Decreased awareness of deficits ?Awareness: Emergent ?Problem Solving: Slow processing, Difficulty sequencing, Requires verbal cues, Requires tactile cues ?  ?  ?  ? ?  ?General Comments General comments (skin integrity, edema, etc.): HR in 120s, SpO2 >94%O2 on RA, 115/79 ? ?  ?Exercises General Exercises - Lower Extremity ?Ankle Circles/Pumps: AROM, Both, 10 reps, Supine  ? ?Assessment/Plan  ?  ?PT Assessment Patient needs continued PT services  ?PT Problem List Decreased strength;Decreased range of motion;Decreased activity tolerance;Decreased balance;Decreased mobility;Decreased coordination;Decreased cognition;Decreased safety awareness;Cardiopulmonary status limiting activity;Pain ? ?   ?  ?PT Treatment Interventions DME instruction;Gait training;Functional mobility training;Therapeutic exercise;Balance training;Cognitive remediation;Patient/family education   ? ?PT Goals (Current goals can be found in the Care Plan section)  ?Acute Rehab PT Goals ?Patient Stated Goal: go home ?PT Goal Formulation: With patient ?Time For Goal  Achievement: 08/07/21 ?Potential to Achieve Goals: Good ? ?  ?Frequency Min 3X/week ?  ? ? ?   ?AM-PAC PT "6 Clicks" Mobility  ?Outcome Measure Help needed turning from your back to your side while in a flat bed without using bedrails?: A Little ?Help needed moving from lying on your back to sitting on the side of a flat bed without using bedrails?: A Little ?Help needed moving to and from a bed to a chair (including a wheelchair)?: A Lot ?Help needed standing up from a chair using your arms (e.g., wheelchair or bedside chair)?: A Lot ?Help needed to walk in hospital room?: Total ?Help needed climbing 3-5 steps with a railing? : Total ?6 Click Score: 12 ? ?  ?End of Session   ?Activity Tolerance: Patient limited by pain;Treatment limited secondary to medical complications (Comment) (L groin hematoma) ?Patient left: in bed;with call bell/phone within reach;with bed alarm set;Other (comment) (left bed in chair position, pt eating breakfast) ?Nurse Communication: Mobility status ?PT Visit Diagnosis: Other abnormalities of gait and mobility (R26.89);Muscle weakness (generalized) (M62.81);Difficulty in walking, not elsewhere classified (R26.2);Pain ?Pain - Right/Left: Right ?Pain - part of body: Leg ?  ? ?Time: 4403-4742 ?PT Time Calculation (min) (ACUTE ONLY): 22 min ? ? ?Charges:   PT Evaluation ?$PT Eval Moderate Complexity: 1 Mod ?  ?  ?   ? ? ?Ellen Goris B. Migdalia Dk PT, DPT ?Acute Rehabilitation Services ?Pager [phone] ?Office [phone] ? ? ?[place] ?07/24/2021, 11:14 AM ? ?

## 2021-07-24 NOTE — Progress Notes (Signed)
Transferred to Progressive bed. Signed out to Dr. Candiss Norse with [facility]. They will pick up patient tomorrow.  ?

## 2021-07-24 NOTE — Progress Notes (Signed)
[facility] ADULT ICU REPLACEMENT PROTOCOL ? ? ?The patient does apply for the [facility] Adult ICU Electrolyte Replacment Protocol based on the criteria listed below:  ? ?1.Exclusion criteria: TCTS patients, ECMO patients, and Dialysis patients ?2. Is GFR >/= 30 ml/min? Yes.    ?Patient's GFR today is >60 ? ?3. Is SCr </= 2? Yes.   ?Patient's SCr is 1.15 mg/dL ?4. Did SCr increase >/= 0.5 in 24 hours? No. ?5.Pt's weight >40kg  Yes.   ?6. Abnormal electrolyte(s): K+3.4  ?7. Electrolytes replaced per protocol ?8.  Call MD STAT for K+ </= 2.5, Phos </= 1, or Mag </= 1 ?Physician:  Oletta Darter ? ? ?Carlisle Beers 07/24/2021 1:37 AM  ?

## 2021-07-24 NOTE — Progress Notes (Addendum)
? ?Progress Note ? ?Patient Name: Kannen Mauri ?Date of Encounter: 07/24/2021 ? ?[facility] Cardiologist: None  ? ?Subjective  ? ?Denies chest pain, reports dyspnea improved. ? ?Inpatient Medications  ?  ?Scheduled Meds: ? aspirin  81 mg Oral Daily  ? atorvastatin  80 mg Oral QHS  ? baclofen  20 mg Oral BID  ? Chlorhexidine Gluconate Cloth  6 each Topical Daily  ? insulin aspart  0-15 Units Subcutaneous Q4H  ? insulin glargine-yfgn  10 Units Subcutaneous BID  ? levothyroxine  88 mcg Oral Daily  ? mouth rinse  15 mL Mouth Rinse BID  ? pantoprazole  40 mg Oral Daily  ? predniSONE  15 mg Oral Q breakfast  ? sertraline  100 mg Oral Daily  ? sodium chloride flush  3 mL Intravenous Q12H  ? ?Continuous Infusions: ? sodium chloride    ? heparin 1,050 Units/hr (07/24/21 2637)  ? ?PRN Meds: ?acetaminophen **OR** acetaminophen, haloperidol lactate, lip balm, LORazepam, oxyCODONE-acetaminophen **AND** oxyCODONE, polyvinyl alcohol  ? ?Vital Signs  ?  ?Vitals:  ? 07/24/21 0504 07/24/21 0530 07/24/21 0600 07/24/21 0700  ?BP: 138/79     ?Pulse: (!) 130 (!) 134    ?Resp: (!) 23 (!) 25    ?Temp:    (!) 101.6 ?F (38.7 ?C)  ?TempSrc:    Oral  ?SpO2: 94% 94%    ?Weight:   91.2 kg   ?Height:      ? ? ?Intake/Output Summary (Last 24 hours) at 07/24/2021 1000 ?Last data filed at 07/24/2021 0700 ?Gross per 24 hour  ?Intake 1245.18 ml  ?Output 1275 ml  ?Net -29.82 ml  ? ? ?  07/24/2021  ?  6:00 AM 07/21/2021  ?  7:37 PM 07/21/2021  ?  5:40 PM  ?Last 3 Weights  ?Weight (lbs) 201 lb 1 oz 208 lb 15.9 oz 209 lb  ?Weight (kg) 91.2 kg 94.8 kg 94.802 kg  ?   ? ?Telemetry  ?  ?Sinus tachycardia - Personally Reviewed ? ?ECG  ?  ?No new ECG - Personally Reviewed ? ?Physical Exam  ? ?GEN: No acute distress.   ?Neck: No JVD ?Cardiac: tachycardic, regular, no murmurs ?Respiratory: Clear to auscultation bilaterally. ?GI: Soft, nontender ?MS: No edema; No deformity. ?Neuro:  Nonfocal  ?Psych: Normal affect  ? ?Labs  ?  ?High Sensitivity Troponin:   ?Recent  Labs  ?Lab 07/21/21 ?1749 07/21/21 ?2010 07/22/21 ?0253  ?TROPONINIHS 852* 1,507* 1,450*  ?   ?Chemistry ?Recent Labs  ?Lab 07/21/21 ?1800 07/22/21 ?0253 07/23/21 ?0118 07/23/21 ?8588 07/24/21 ?0022  ?NA 137 140 146* 145 141  ?K 5.4* 5.1 4.8 4.2 3.4*  ?CL 99 107  --  113* 111  ?CO2 28 26  --  24 24  ?GLUCOSE 350* 131*  --  149* 71  ?BUN 20 13  --  12 10  ?CREATININE 1.63* 1.35*  --  1.08 1.15  ?CALCIUM 10.0 9.8  --  9.2 9.2  ?PROT 6.4* 5.3*  --   --  5.6*  ?ALBUMIN 3.7 3.1*  --   --  3.2*  ?AST 42* 32  --   --  41  ?ALT 27 25  --   --  30  ?ALKPHOS 47 45  --   --  47  ?BILITOT 1.2 0.8  --   --  1.0  ?GFRNONAA 44* 56*  --  >60 >60  ?ANIONGAP 10 7  --  8 6  ?  ?Lipids  ?Recent Labs  ?  Lab 07/24/21 ?0022  ?CHOL 182  ?TRIG 132  ?HDL 41  ?LDLCALC 115*  ?CHOLHDL 4.4  ?  ?Hematology ?Recent Labs  ?Lab 07/22/21 ?0253 07/23/21 ?0118 07/23/21 ?5956 07/24/21 ?0022  ?WBC 8.0  --  8.4 8.0  ?RBC 4.18*  --  4.02* 4.17*  ?HGB 13.3 13.9 12.7* 13.3  ?HCT 41.2 41.0 37.8* 40.1  ?MCV 98.6  --  94.0 96.2  ?MCH 31.8  --  31.6 31.9  ?MCHC 32.3  --  33.6 33.2  ?RDW 14.3  --  14.0 14.1  ?PLT 129*  --  120* 115*  ? ?Thyroid No results for input(s): TSH, FREET4 in the last 168 hours.  ?BNP ?Recent Labs  ?Lab 07/21/21 ?1749 07/24/21 ?0022  ?BNP 64.8 119.6*  ?  ?DDimer No results for input(s): DDIMER in the last 168 hours.  ? ?Radiology  ?  ?IR Angiogram Pulmonary Bilateral Selective ? ?Result Date: 07/23/2021 ?INDICATION: 71 year old gentleman with submassive pulmonary artery embolism and continued tachycardia and anxiety presents to IR for mechanical thrombectomy. EXAM: 1. Ultrasound-guided access of left common femoral vein. 2. Bilateral pulmonary angiogram. 3. Bilateral mechanical suction thrombectomy. COMPARISON:  CT angiography chest 07/21/2021 MEDICATIONS: None. ANESTHESIA/SEDATION: Sedation performed and monitored by the anesthesia team. FLUOROSCOPY TIME:  Fluoroscopy Time: 34 minutes 30 seconds (208 mGy). COMPLICATIONS: None immediate.  TECHNIQUE: Informed written consent was obtained from the patient's wife and son after a thorough discussion of the procedural risks, benefits and alternatives. All questions were addressed. Maximal Sterile Barrier Technique was utilized including caps, mask, sterile gowns, sterile gloves, sterile drape, hand hygiene and skin antiseptic. A timeout was performed prior to the initiation of the procedure. Patient positioned supine on the procedure table. The left groin prepped and draped usual fashion. Ultrasound image documenting patency of the left common femoral vein was obtained and placed in permanent medical record. Sterile ultrasound probe cover and gel utilized throughout the procedure. Utilizing continuous ultrasound guidance, the right common femoral vein was accessed at the level of the femoral head with a 21 gauge needle. 21 gauge needle exchanged for a transitional dilator set over 0.018 inch guidewire. Transitional dilator set exchanged for 5 French sheath over 0.035 inch guidewire. Angled pigtail catheter advanced to the main pulmonary artery utilizing fluoroscopic guidance. Pre thrombectomy pulmonary artery pressure: 61/20 mmHg (mean 35 mmHg). Pulmonary angiogram showed significant thrombus within the main pulmonary arteries as well as extending into the right upper and middle and left upper and lower lobes. Angled pigtail catheter removed over exchange length Amplatz guidewire. Serial dilation was performed and 5 [place] sheath exchanged for 24 French sheath. Utilizing a Berenstein catheter, the guidewire was directed into a left lower lobe pulmonary artery branch. The FlowTriver device was advanced over the wire into the lower lobe branches of the left pulmonary arterial system. Mechanical aspiration thrombectomy was performed extirpating thrombus. The device was slowly retracted until the lumen was aimed towards the upper lobe branches and additional aspiration thrombectomy was performed extirpating  thrombus. The right main pulmonary artery was then selected with the angled pigtail catheter. Angled pigtail catheter exchanged for Berenstein catheter and access was further advanced into the segmental medial basilar branch of the right lower lobe. 71 [place] FlowTriver device was advanced over the wire into the medial basilar segmental branch of the right pulmonary arterial system. Mechanical aspiration thrombectomy was performed, slowly retracting the device, extirpating thrombus. Additional aspiration thrombectomy was performed while the lumen of the device was aimed towards the truncus arteriosus. Additional  thrombus was removed through suction aspiration at this site. Post thrombectomy pulmonary angiogram demonstrated significant reduction in overall clot burden in the pulmonary artery branches bilaterally. Post thrombectomy Pulmonary artery pressure: 38/12 mmHg (mean 19 mmHg) FlowTriever device was removed. 24 fr sheath was removed from left CFV and hemostasis achieved with purse-string suture and manual compression. IMPRESSION: 1. Bilateral pulmonary angiogram and suction thrombectomy. 2. Pre thrombectomy pressures of 61/20 (35) mm Hg. 3. Post thrombectomy pressures of 38/12 (19) mm Hg. Electronically Signed   By: Miachel Roux M.D.   On: 07/23/2021 15:27  ? ?IR THROMBECT VENO MECH MOD SED ? ?Result Date: 07/23/2021 ?INDICATION: 71 year old gentleman with submassive pulmonary artery embolism and continued tachycardia and anxiety presents to IR for mechanical thrombectomy. EXAM: 1. Ultrasound-guided access of left common femoral vein. 2. Bilateral pulmonary angiogram. 3. Bilateral mechanical suction thrombectomy. COMPARISON:  CT angiography chest 07/21/2021 MEDICATIONS: None. ANESTHESIA/SEDATION: Sedation performed and monitored by the anesthesia team. FLUOROSCOPY TIME:  Fluoroscopy Time: 34 minutes 30 seconds (208 mGy). COMPLICATIONS: None immediate. TECHNIQUE: Informed written consent was obtained from  the patient's wife and son after a thorough discussion of the procedural risks, benefits and alternatives. All questions were addressed. Maximal Sterile Barrier Technique was utilized including caps, mask,

## 2021-07-24 NOTE — Progress Notes (Signed)
eLink Physician-Brief Progress Note ?Patient Name: [name] ?DOB: [birth year] ?MRN: [id number] ? ? ?Date of Service ? 07/24/2021  ?HPI/Events of Note ? Sinus Tachycardia - HR = 128.  ?eICU Interventions ? Plan: ?Will bolus with 0.9 NaCl 500 mL IV over 30 minutes now.   ? ? ? ?Intervention Category ?Major Interventions: Arrhythmia - evaluation and management ? ?Emilya Justen Cornelia Copa ?07/24/2021, 6:52 AM ?

## 2021-07-24 NOTE — Progress Notes (Signed)
? ? ?Referring Physician(s): ?Dr. Erskine Emery ? ?Supervising Physician: Ruthann Cancer ? ?Patient Status:  [facility] - In-pt ? ?Chief Complaint: ?Pulmonary embolus ? ?Subjective: ?Lying in bed.  Eating breakfast.  ?States he can do more with less shortness of breath after thrombectomy.  Planning to get OOB with PT today.  ?Tacycardic, febrile this AM.  ?Blood cultures obtained.  ? ?Allergies: ?Benazepril, Gabapentin, Hydrocodone, and Shellfish allergy ? ?Medications: ?Prior to Admission medications   ?Medication Sig Start Date End Date Taking? Authorizing Provider  ?acarbose (PRECOSE) 100 MG tablet Take 100 mg by mouth 3 (three) times daily with meals.   Yes [provider]  ?baclofen (LIORESAL) 20 MG tablet Take 20 mg by mouth 2 (two) times daily. 06/22/21  Yes [provider]  ?calcium carbonate (OS-CAL) 600 MG TABS tablet Take 600 mg by mouth 2 (two) times daily with a meal.   Yes [provider]  ?folic acid (FOLVITE) 1 MG tablet Take 1 mg by mouth daily.   Yes [provider]  ?furosemide (LASIX) 20 MG tablet Take 20 mg by mouth daily. 05/25/21  Yes [provider]  ?glipiZIDE (GLUCOTROL XL) 10 MG 24 hr tablet Take 10 mg by mouth 2 (two) times daily.    Yes [provider]  ?hydrOXYzine (ATARAX/VISTARIL) 25 MG tablet TAKE 1 TABLET (25 MG TOTAL) BY MOUTH 3 (THREE) TIMES DAILY AS NEEDED. FOR ITCHING ?Patient taking differently: Take 25 mg by mouth 3 (three) times daily as needed for itching. For itching 12/28/19  Yes [name], DO  ?insulin glargine (LANTUS) 100 UNIT/ML injection Inject 0.46-0.56 mLs (46-56 Units total) into the skin at bedtime. For now take 30 units twice a day and then increase to your usual dose as your oral intake improves. Increase both doses by 2units every day. Check your blood sugars at home three times daily. Call your doctor if sugar level is more than 300 or less than 80. ?Patient taking differently: Inject 42-44 Units into the skin 2  (two) times daily. Check your blood sugars at home three times daily. Call your doctor if sugar level is more than 300 or less than 80. 10/01/14  Yes Bonnielee Haff, MD  ?Levothyroxine Sodium 88 MCG CAPS Take 88 mcg by mouth daily.   Yes [provider]  ?losartan (COZAAR) 50 MG tablet Take 25 mg by mouth daily. Take 1/2 tablet   Yes [provider]  ?metFORMIN (GLUCOPHAGE) 1000 MG tablet Take 1,000 mg by mouth 2 (two) times daily. 06/20/21  Yes [provider]  ?methotrexate (RHEUMATREX) 5 MG tablet Take 2 tablets (10 mg total) by mouth once a week. Caution: Chemotherapy. Protect from light. Take on fridays per patient. ? ?OKAY TO RESUME FROM 10/08/14. ?Patient taking differently: Take 30 mg by mouth once a week. Caution: Chemotherapy. Protect from light. Take on fridays per patient. 6 Tabs once a week 10/01/14  Yes Bonnielee Haff, MD  ?nystatin (MYCOSTATIN/NYSTOP) powder SMARTSIG:1 Application Topical 2-3 Times Daily 05/08/21  Yes [provider]  ?pantoprazole (PROTONIX) 40 MG tablet TAKE 1 TABLET BY MOUTH EVERY DAY ?Patient taking differently: Take 40 mg by mouth daily. 11/04/20  Yes [name], DO  ?polyvinyl alcohol (LIQUIFILM TEARS) 1.4 % ophthalmic solution 1 drop as needed for dry eyes.   Yes [provider]  ?predniSONE (DELTASONE) 5 MG tablet Take 15 mg by mouth daily with breakfast.   Yes [provider]  ?sertraline (ZOLOFT) 100 MG tablet Take 100 mg by mouth daily.  Yes [provider]  ?tadalafil (CIALIS) 20 MG tablet Take 20 mg by mouth daily. 08/27/19  Yes [provider]  ?testosterone cypionate (DEPOTESTOSTERONE CYPIONATE) 200 MG/ML injection Inject 0.5 mg into the muscle every 7 (seven) days. 05/21/21  Yes [provider]  ?traMADol (ULTRAM) 50 MG tablet Take 100 mg by mouth 3 (three) times daily as needed for moderate pain. 08/04/19  Yes [provider]  ?triamcinolone cream (KENALOG) 0.1 % Use 1 application twice  daily as needed to red itchy areas below the face 09/30/18  Yes Padgett, Rae Halsted, MD  ?DULoxetine (CYMBALTA) 30 MG capsule Take 30 mg by mouth daily. ?Patient not taking: Reported on 07/21/2021 09/25/19   [provider]  ?empagliflozin (JARDIANCE) 25 MG TABS tablet Take 10 mg by mouth. Take 1/2 tab ?Patient not taking: Reported on 07/21/2021 08/25/18   [provider]  ?gabapentin (NEURONTIN) 100 MG capsule Take 1 capsule (100 mg total) by mouth 3 (three) times daily. ?Patient not taking: Reported on 07/21/2021 01/17/20   [name], DO  ? ? ? ?Vital Signs: ?BP 115/79   Pulse (!) 120   Temp 100.1 ?F (37.8 ?C) (Oral)   Resp (!) 24   Ht '5\' 8"'$  (1.727 m)   Wt 201 lb 1 oz (91.2 kg)   SpO2 95%   BMI 30.57 kg/m?  ? ?Physical Exam ?Alert, oriented, impulsive ?Chest: no increased WOB ?Cardio: Tachycardia ?Skin: L groin site with area of bruising extending into the inguinal crease. Very tender to palpation.  Removal of bandage shows purse-string suture is intact. Small amount of dried blood on bandage. Palpable knot-like area of tenderness superior to the suture.  ? ?Imaging: ?CT Head Wo Contrast ? ?Result Date: 07/21/2021 ?CLINICAL DATA:  Altered mental status EXAM: CT HEAD WITHOUT CONTRAST TECHNIQUE: Contiguous axial images were obtained from the base of the skull through the vertex without intravenous contrast. RADIATION DOSE REDUCTION: This exam was performed according to the departmental dose-optimization program which includes automated exposure control, adjustment of the mA and/or kV according to patient size and/or use of iterative reconstruction technique. COMPARISON:  02/22/2020 FINDINGS: Brain: No acute intracranial findings are seen. There is prominence of third and both lateral ventricles with no significant interval change. Cortical sulci are prominent. Vascular: Unremarkable. Skull: No fracture is seen in the calvarium. Sinuses/Orbits: There is mucosal thickening in the ethmoid  sinus. Other: None IMPRESSION: No acute intracranial findings are seen in noncontrast CT brain. Chronic ethmoid sinusitis. Electronically Signed   By: Elmer Picker M.D.   On: 07/21/2021 19:36  ? ?CT Angio Chest PE W and/or Wo Contrast ? ?Result Date: 07/21/2021 ?CLINICAL DATA:  Pulmonary embolism suspected EXAM: CT ANGIOGRAPHY CHEST CT ABDOMEN AND PELVIS WITH CONTRAST TECHNIQUE: Multidetector CT imaging of the chest was performed using the standard protocol during bolus administration of intravenous contrast. Multiplanar CT image reconstructions and MIPs were obtained to evaluate the vascular anatomy. Multidetector CT imaging of the abdomen and pelvis was performed using the standard protocol during bolus administration of intravenous contrast. RADIATION DOSE REDUCTION: This exam was performed according to the departmental dose-optimization program which includes automated exposure control, adjustment of the mA and/or kV according to patient size and/or use of iterative reconstruction technique. CONTRAST:  140m OMNIPAQUE IOHEXOL 350 MG/ML SOLN COMPARISON:  None. FINDINGS: CTA CHEST FINDINGS Cardiovascular: There is a saddlepulmonary embolus that extends into segmental branches bilaterally. There is evidence of right heart strain within RV/LV ratio of 1.35. The aorta is normal aside  from mild atherosclerotic calcification. Mediastinum/Nodes: No mediastinal, hilar or axillary lymphadenopathy. The visualized thyroid and thoracic esophageal course are unremarkable. Lungs/Pleura: No pulmonary nodules or masses. No pleural effusion or pneumothorax. No focal airspace consolidation. No focal pleural abnormality. Musculoskeletal: No chest wall abnormality. No acute or significant osseous findings. Review of the MIP images confirms the above findings. CT ABDOMEN and PELVIS FINDINGS Hepatobiliary: Normal hepatic contours and density. No visible biliary dilatation. Normal gallbladder. Pancreas: Normal contours without  ductal dilatation. No peripancreatic fluid collection. Spleen: Normal. Adrenals/Urinary Tract: --Adrenal glands: Normal. --Right kidney/ureter: No hydronephrosis or perinephric stranding. No nephrolithiasis. No obs

## 2021-07-25 ENCOUNTER — Other Ambulatory Visit (HOSPITAL_COMMUNITY): Payer: Self-pay

## 2021-07-25 ENCOUNTER — Inpatient Hospital Stay (HOSPITAL_COMMUNITY): Payer: Medicare Other

## 2021-07-25 DIAGNOSIS — I5041 Acute combined systolic (congestive) and diastolic (congestive) heart failure: Secondary | ICD-10-CM | POA: Diagnosis not present

## 2021-07-25 DIAGNOSIS — J9601 Acute respiratory failure with hypoxia: Secondary | ICD-10-CM | POA: Diagnosis not present

## 2021-07-25 DIAGNOSIS — I82401 Acute embolism and thrombosis of unspecified deep veins of right lower extremity: Secondary | ICD-10-CM | POA: Diagnosis not present

## 2021-07-25 DIAGNOSIS — I214 Non-ST elevation (NSTEMI) myocardial infarction: Secondary | ICD-10-CM

## 2021-07-25 DIAGNOSIS — I2692 Saddle embolus of pulmonary artery without acute cor pulmonale: Secondary | ICD-10-CM | POA: Diagnosis not present

## 2021-07-25 LAB — BASIC METABOLIC PANEL
Anion gap: 9 (ref 5–15)
BUN: 19 mg/dL (ref 8–23)
CO2: 23 mmol/L (ref 22–32)
Calcium: 8.7 mg/dL — ABNORMAL LOW (ref 8.9–10.3)
Chloride: 106 mmol/L (ref 98–111)
Creatinine, Ser: 1.37 mg/dL — ABNORMAL HIGH (ref 0.61–1.24)
GFR, Estimated: 55 mL/min — ABNORMAL LOW (ref >60)
Glucose, Bld: 294 mg/dL — ABNORMAL HIGH (ref 70–99)
Potassium: 4 mmol/L (ref 3.5–5.1)
Sodium: 138 mmol/L (ref 135–145)

## 2021-07-25 LAB — HEMOGLOBIN A1C
Hgb A1c MFr Bld: 7.3 % — ABNORMAL HIGH (ref 4.8–5.6)
Mean Plasma Glucose: 162.81 mg/dL

## 2021-07-25 LAB — GLUCOSE, CAPILLARY
Glucose-Capillary: 256 mg/dL — ABNORMAL HIGH (ref 70–99)
Glucose-Capillary: 450 mg/dL — ABNORMAL HIGH (ref 70–99)
Glucose-Capillary: 274 mg/dL — ABNORMAL HIGH (ref 70–99)
Glucose-Capillary: 228 mg/dL — ABNORMAL HIGH (ref 70–99)
Glucose-Capillary: 296 mg/dL — ABNORMAL HIGH (ref 70–99)
Glucose-Capillary: 317 mg/dL — ABNORMAL HIGH (ref 70–99)

## 2021-07-25 LAB — HEPARIN LEVEL (UNFRACTIONATED)
Heparin Unfractionated: 0.43 IU/mL (ref 0.30–0.70)
Heparin Unfractionated: <0.1 IU/mL — ABNORMAL LOW (ref 0.30–0.70)

## 2021-07-25 LAB — CBC
HCT: 35.2 % — ABNORMAL LOW (ref 39.0–52.0)
Hemoglobin: 11.6 g/dL — ABNORMAL LOW (ref 13.0–17.0)
MCH: 31 pg (ref 26.0–34.0)
MCHC: 33 g/dL (ref 30.0–36.0)
MCV: 94.1 fL (ref 80.0–100.0)
Platelets: 142 10*3/uL — ABNORMAL LOW (ref 150–400)
RBC: 3.74 MIL/uL — ABNORMAL LOW (ref 4.22–5.81)
RDW: 13.5 % (ref 11.5–15.5)
WBC: 7.5 10*3/uL (ref 4.0–10.5)
nRBC: 0 % (ref 0.0–0.2)

## 2021-07-25 MED ORDER — HYDRALAZINE HCL 25 MG PO TABS
25.0000 mg | ORAL_TABLET | Freq: Three times a day (TID) | ORAL | Status: DC
  Administered 2021-07-25 – 2021-07-27 (×6): 25 mg via ORAL
  Filled 2021-07-25 (×6): qty 1

## 2021-07-25 MED ORDER — ISOSORBIDE MONONITRATE ER 30 MG PO TB24
30.0000 mg | ORAL_TABLET | Freq: Every day | ORAL | Status: DC
  Administered 2021-07-25 – 2021-07-27 (×3): 30 mg via ORAL
  Filled 2021-07-25 (×3): qty 1

## 2021-07-25 MED ORDER — CARVEDILOL 6.25 MG PO TABS
6.2500 mg | ORAL_TABLET | Freq: Two times a day (BID) | ORAL | Status: DC
  Administered 2021-07-25 – 2021-07-27 (×4): 6.25 mg via ORAL
  Filled 2021-07-25 (×4): qty 1

## 2021-07-25 MED ORDER — ISOSORB DINITRATE-HYDRALAZINE 20-37.5 MG PO TABS
1.0000 | ORAL_TABLET | Freq: Three times a day (TID) | ORAL | Status: DC
Start: 2021-07-25 — End: 2021-07-25
  Filled 2021-07-25: qty 1

## 2021-07-25 MED ORDER — LORAZEPAM 0.5 MG PO TABS
0.5000 mg | ORAL_TABLET | Freq: Two times a day (BID) | ORAL | Status: DC | PRN
  Administered 2021-07-25 (×2): 0.5 mg via ORAL
  Filled 2021-07-25 (×2): qty 1

## 2021-07-25 MED ORDER — CARVEDILOL 3.125 MG PO TABS
3.1250 mg | ORAL_TABLET | Freq: Once | ORAL | Status: AC
  Administered 2021-07-25: 3.125 mg via ORAL
  Filled 2021-07-25: qty 1

## 2021-07-25 MED ORDER — INSULIN ASPART 100 UNIT/ML IJ SOLN
3.0000 [IU] | Freq: Three times a day (TID) | INTRAMUSCULAR | Status: DC
  Administered 2021-07-25 – 2021-07-27 (×5): 3 [IU] via SUBCUTANEOUS

## 2021-07-25 MED ORDER — INSULIN ASPART 100 UNIT/ML IJ SOLN
5.0000 [IU] | Freq: Once | INTRAMUSCULAR | Status: AC
  Administered 2021-07-25: 5 [IU] via SUBCUTANEOUS

## 2021-07-25 MED ORDER — INSULIN GLARGINE-YFGN 100 UNIT/ML ~~LOC~~ SOLN
28.0000 [IU] | Freq: Two times a day (BID) | SUBCUTANEOUS | Status: DC
  Administered 2021-07-25: 28 [IU] via SUBCUTANEOUS
  Filled 2021-07-25 (×2): qty 0.28

## 2021-07-25 MED ORDER — INSULIN ASPART 100 UNIT/ML IJ SOLN
0.0000 [IU] | Freq: Every day | INTRAMUSCULAR | Status: DC
  Administered 2021-07-25: 4 [IU] via SUBCUTANEOUS
  Administered 2021-07-26: 2 [IU] via SUBCUTANEOUS

## 2021-07-25 MED ORDER — LOSARTAN POTASSIUM 50 MG PO TABS
25.0000 mg | ORAL_TABLET | Freq: Every day | ORAL | Status: DC
Start: 2021-07-25 — End: 2021-07-25

## 2021-07-25 MED ORDER — INSULIN GLARGINE-YFGN 100 UNIT/ML ~~LOC~~ SOLN
10.0000 [IU] | Freq: Once | SUBCUTANEOUS | Status: AC
  Administered 2021-07-25: 10 [IU] via SUBCUTANEOUS
  Filled 2021-07-25: qty 0.1

## 2021-07-25 MED ORDER — INSULIN ASPART 100 UNIT/ML IJ SOLN
0.0000 [IU] | Freq: Three times a day (TID) | INTRAMUSCULAR | Status: DC
  Administered 2021-07-25: 5 [IU] via SUBCUTANEOUS
  Administered 2021-07-26 (×2): 8 [IU] via SUBCUTANEOUS
  Administered 2021-07-26: 2 [IU] via SUBCUTANEOUS
  Administered 2021-07-27: 3 [IU] via SUBCUTANEOUS

## 2021-07-25 NOTE — TOC Benefit Eligibility Note (Signed)
Patient Advocate Encounter ? ?Insurance verification completed.   ? ?The patient is currently admitted and upon discharge could be taking Xarelto 20 mg. ? ?The current 30 day co-pay is, $69.25.  ? ?The patient is currently admitted and upon discharge could be taking Eliquis 5 mg. ? ?The current 30 day co-pay is, $85.02.  ? ?The patient is insured through [employer] ? ? ? ?Lyndel Safe, CPhT ?Pharmacy Patient Advocate Specialist ?[facility] Patient Advocate Team ?Direct Number: [phone]  Fax: [phone] ? ? ? ? ? ?  ?

## 2021-07-25 NOTE — Evaluation (Signed)
Occupational Therapy Evaluation ?Patient Details ?Name: [name] ?MRN: [id number] ?DOB: [birth year] ?Today's Date: 07/25/2021 ? ? ?History of Present Illness 71 y.o. male presents to ED 3/24 with complaints of 2 bouts of increased confuseion with low glucose, and R LE pain. In ED , BP in 75-643P systolic and HR in 295-188C CT PE study showed saddle pulmonary embolus with right heart strain and RV to LV ratio of 1.35 consistent with at least submassive PE. Venous Dopplers showed occlusive right popliteal DVT s/p Bilateral pulmonary angiogram and thrombectomy 07/22/21  PMH: diabetes, hypertension, anemia, hypothyroidism, BPH, GERD, PTSD, autoimmune myositis presenting with weakness, pain, confusion.  ? ?Clinical Impression ?  ?PTA, pt lives with spouse, reports Modified Independent with ADLs, some IADLs (kitchen tasks), and mobility using Rollator. Pt presents now with deficits in cognition (memory, attention, safety), dynamic standing balance, endurance and baseline R LE pain. Pt eager to participate and expresses concern in avoiding another fall. Pt overall Min A for mobility using RW, Supervision for UB ADL and Min-Mod A for LB ADLs due to deficits. Pt with HR up to 125bpm with ADLs and notably winded though recovers well with seated rest break. Based on rehab potential and active at baseline, anticipate that pt would progress quickly with AIR level therapies. Pt also endorses that wife can provide light assist at home as needed. Will continue to follow acutely. ?   ? ?Recommendations for follow up therapy are one component of a multi-disciplinary discharge planning process, led by the attending physician.  Recommendations may be updated based on patient status, additional functional criteria and insurance authorization.  ? ?Follow Up Recommendations ? Acute inpatient rehab (3hours/day)  ?  ?Assistance Recommended at Discharge    ?Patient can return home with the following A little help with walking and/or transfers;A  little help with bathing/dressing/bathroom;Assistance with cooking/housework;Help with stairs or ramp for entrance;Assist for transportation ? ?  ?Functional Status Assessment ? Patient has had a recent decline in their functional status and demonstrates the ability to make significant improvements in function in a reasonable and predictable amount of time.  ?Equipment Recommendations ? None recommended by OT  ?  ?Recommendations for Other Services Rehab consult ? ? ?  ?Precautions / Restrictions Precautions ?Precautions: Fall ?Precaution Comments: fall during admission, watch HR ?Restrictions ?Weight Bearing Restrictions: No  ? ?  ? ?Mobility Bed Mobility ?Overal bed mobility: Needs Assistance ?Bed Mobility: Supine to Sit ?  ?  ?Supine to sit: Mod assist, HOB elevated ?  ?  ?  ?  ? ?Transfers ?Overall transfer level: Needs assistance ?Equipment used: Rolling walker (2 wheels) ?Transfers: Sit to/from Stand ?Sit to Stand: Min assist ?  ?  ?  ?  ?  ?General transfer comment: Min A to bring hips up and forward, increased time and effort. at end of session, guided pt in sit to stand practice from recliner with cues for hand placement and improvements to supervision ?  ? ?  ?Balance Overall balance assessment: Needs assistance ?Sitting-balance support: No upper extremity supported, Feet supported ?Sitting balance-Leahy Scale: Good ?  ?  ?Standing balance support: Bilateral upper extremity supported, During functional activity, Reliant on assistive device for balance ?Standing balance-Leahy Scale: Poor ?  ?  ?  ?  ?  ?  ?  ?  ?  ?  ?  ?  ?   ? ?ADL either performed or assessed with clinical judgement  ? ?ADL Overall ADL's : Needs assistance/impaired ?Eating/Feeding: Independent;Sitting ?  ?Grooming:  Min guard;Standing;Oral care;Wash/dry face ?Grooming Details (indicate cue type and reason): also requires cues for attention, sequencing ?Upper Body Bathing: Supervision/ safety;Sitting ?  ?Lower Body Bathing: Minimal  assistance;Sit to/from stand ?  ?Upper Body Dressing : Supervision/safety;Sitting ?  ?Lower Body Dressing: Moderate assistance;Sitting/lateral leans;Sit to/from stand ?Lower Body Dressing Details (indicate cue type and reason): difficulty reaching B feet (R>L LE) ?Toilet Transfer: Minimal assistance;Ambulation ?  ?Toileting- Clothing Manipulation and Hygiene: Minimal assistance;Sit to/from stand ?  ?  ?  ?Functional mobility during ADLs: Minimal assistance;Rolling walker (2 wheels) ?General ADL Comments: Hx of memory deficits though family not present to confirm is cognition more impaired than usual - requires cues to stay on task, sequence, environmental awareness and safety  ? ? ? ?Vision Ability to See in Adequate Light: 0 Adequate ?Vision Assessment?: No apparent visual deficits  ?   ?Perception   ?  ?Praxis   ?  ? ?Pertinent Vitals/Pain Pain Assessment ?Pain Assessment: Faces ?Faces Pain Scale: Hurts little more ?Pain Location: R LE with movement ?Pain Descriptors / Indicators: Grimacing, Guarding ?Pain Intervention(s): Monitored during session, Limited activity within patient's tolerance  ? ? ? ?Hand Dominance Right ?  ?Extremity/Trunk Assessment Upper Extremity Assessment ?Upper Extremity Assessment: Overall WFL for tasks assessed ?  ?Lower Extremity Assessment ?Lower Extremity Assessment: Defer to PT evaluation ?  ?Cervical / Trunk Assessment ?Cervical / Trunk Assessment: Normal ?  ?Communication Communication ?Communication: No difficulties ?  ?Cognition Arousal/Alertness: Awake/alert ?Behavior During Therapy: Flat affect ?Overall Cognitive Status: Impaired/Different from baseline ?Area of Impairment: Following commands, Safety/judgement, Awareness, Problem solving, Memory, Attention ?  ?  ?  ?  ?  ?  ?  ?  ?  ?Current Attention Level: Selective ?Memory: Decreased short-term memory ?Following Commands: Follows multi-step commands inconsistently, Follows one step commands consistently, Follows one step  commands with increased time, Follows multi-step commands with increased time ?Safety/Judgement: Decreased awareness of deficits, Decreased awareness of safety ?Awareness: Emergent ?Problem Solving: Slow processing, Difficulty sequencing, Requires verbal cues, Requires tactile cues ?General Comments: benefits from redirection to tasks, easily distracted. baseline memory deficits observable in session. aware of fall, safety concerns and endorses fear of falling again ?  ?  ?General Comments  HR up to 125 with activity, sustained 119 and 2/4 DOE noted ? ?  ?Exercises   ?  ?Shoulder Instructions    ? ? ?Home Living Family/patient expects to be discharged to:: Private residence ?Living Arrangements: Spouse/significant other ?Available Help at Discharge: Family;Available 24 hours/day ?Type of Home: House ?Home Access: Stairs to enter ?Entrance Stairs-Number of Steps: 3 ?Entrance Stairs-Rails: None ?Home Layout: Two level;Bed/bath upstairs ?Alternate Level Stairs-Number of Steps: 18 ?  ?Bathroom Shower/Tub: Walk-in shower ?  ?Bathroom Toilet: Standard ?Bathroom Accessibility: No ?  ?Home Equipment: Rollator (4 wheels);Cane - single point;Transport chair;Hand held shower head ?  ?  ?  ? ?  ?Prior Functioning/Environment Prior Level of Function : Needs assist ?  ?  ?  ?  ?  ?  ?Mobility Comments: uses Rollator or cane for household distances, needs transport chair for community mobility; wife assists with stairs prn ?ADLs Comments: reports independence with ADLs, reports cooking meals, wife cleans and assists with pt's meds ?  ? ?  ?  ?OT Problem List: Decreased strength;Decreased activity tolerance;Impaired balance (sitting and/or standing);Decreased cognition;Decreased safety awareness;Decreased knowledge of use of DME or AE;Pain ?  ?   ?OT Treatment/Interventions: Self-care/ADL training;Therapeutic exercise;Energy conservation;DME and/or AE instruction;Therapeutic activities;Patient/family education;Balance training  ?   ?OT  Goals(Current goals can be found in the care plan section) Acute Rehab OT Goals ?Patient Stated Goal: improve mobility, no more falls ?OT Goal Formulation: With patient ?Time For Goal Achievement: 08/08/21 ?

## 2021-07-25 NOTE — Progress Notes (Signed)
? ? ?Referring Physician(s): ?Dr Jonah Blue ? ?Supervising Physician: Michaelle Birks ? ?Patient Status:  [facility] - In-pt ? ?Chief Complaint: ? ?PE thrombectomy 3/25 ? ?Subjective: ? ?Doing well ? Up in bed ?No complaints; breathing easily ?Small hematoma noted at left groin site 3/27 ?Re evaluation today ? ?Allergies: ?Benazepril, Gabapentin, Hydrocodone, and Shellfish allergy ? ?Medications: ?Prior to Admission medications   ?Medication Sig Start Date End Date Taking? Authorizing Provider  ?acarbose (PRECOSE) 100 MG tablet Take 100 mg by mouth 3 (three) times daily with meals.   Yes [provider]  ?baclofen (LIORESAL) 20 MG tablet Take 20 mg by mouth 2 (two) times daily. 06/22/21  Yes [provider]  ?calcium carbonate (OS-CAL) 600 MG TABS tablet Take 600 mg by mouth 2 (two) times daily with a meal.   Yes [provider]  ?folic acid (FOLVITE) 1 MG tablet Take 1 mg by mouth daily.   Yes [provider]  ?furosemide (LASIX) 20 MG tablet Take 20 mg by mouth daily. 05/25/21  Yes [provider]  ?glipiZIDE (GLUCOTROL XL) 10 MG 24 hr tablet Take 10 mg by mouth 2 (two) times daily.    Yes [provider]  ?hydrOXYzine (ATARAX/VISTARIL) 25 MG tablet TAKE 1 TABLET (25 MG TOTAL) BY MOUTH 3 (THREE) TIMES DAILY AS NEEDED. FOR ITCHING ?Patient taking differently: Take 25 mg by mouth 3 (three) times daily as needed for itching. For itching 12/28/19  Yes [name], DO  ?insulin glargine (LANTUS) 100 UNIT/ML injection Inject 0.46-0.56 mLs (46-56 Units total) into the skin at bedtime. For now take 30 units twice a day and then increase to your usual dose as your oral intake improves. Increase both doses by 2units every day. Check your blood sugars at home three times daily. Call your doctor if sugar level is more than 300 or less than 80. ?Patient taking differently: Inject 42-44 Units into the skin 2 (two) times daily. Check your blood sugars at home three times daily. Call  your doctor if sugar level is more than 300 or less than 80. 10/01/14  Yes Bonnielee Haff, MD  ?Levothyroxine Sodium 88 MCG CAPS Take 88 mcg by mouth daily.   Yes [provider]  ?losartan (COZAAR) 50 MG tablet Take 25 mg by mouth daily. Take 1/2 tablet   Yes [provider]  ?metFORMIN (GLUCOPHAGE) 1000 MG tablet Take 1,000 mg by mouth 2 (two) times daily. 06/20/21  Yes [provider]  ?methotrexate (RHEUMATREX) 5 MG tablet Take 2 tablets (10 mg total) by mouth once a week. Caution: Chemotherapy. Protect from light. Take on fridays per patient. ? ?OKAY TO RESUME FROM 10/08/14. ?Patient taking differently: Take 30 mg by mouth once a week. Caution: Chemotherapy. Protect from light. Take on fridays per patient. 6 Tabs once a week 10/01/14  Yes Bonnielee Haff, MD  ?nystatin (MYCOSTATIN/NYSTOP) powder SMARTSIG:1 Application Topical 2-3 Times Daily 05/08/21  Yes [provider]  ?pantoprazole (PROTONIX) 40 MG tablet TAKE 1 TABLET BY MOUTH EVERY DAY ?Patient taking differently: Take 40 mg by mouth daily. 11/04/20  Yes [name], DO  ?polyvinyl alcohol (LIQUIFILM TEARS) 1.4 % ophthalmic solution 1 drop as needed for dry eyes.   Yes [provider]  ?predniSONE (DELTASONE) 5 MG tablet Take 15 mg by mouth daily with breakfast.   Yes [provider]  ?sertraline (ZOLOFT) 100 MG tablet Take 100 mg by mouth daily.   Yes [provider]  ?tadalafil (CIALIS) 20 MG tablet Take  20 mg by mouth daily. 08/27/19  Yes [provider]  ?testosterone cypionate (DEPOTESTOSTERONE CYPIONATE) 200 MG/ML injection Inject 0.5 mg into the muscle every 7 (seven) days. 05/21/21  Yes [provider]  ?traMADol (ULTRAM) 50 MG tablet Take 100 mg by mouth 3 (three) times daily as needed for moderate pain. 08/04/19  Yes [provider]  ?triamcinolone cream (KENALOG) 0.1 % Use 1 application twice daily as needed to red itchy areas below the face 09/30/18  Yes Padgett,  Rae Halsted, MD  ?DULoxetine (CYMBALTA) 30 MG capsule Take 30 mg by mouth daily. ?Patient not taking: Reported on 07/21/2021 09/25/19   [provider]  ?empagliflozin (JARDIANCE) 25 MG TABS tablet Take 10 mg by mouth. Take 1/2 tab ?Patient not taking: Reported on 07/21/2021 08/25/18   [provider]  ?gabapentin (NEURONTIN) 100 MG capsule Take 1 capsule (100 mg total) by mouth 3 (three) times daily. ?Patient not taking: Reported on 07/21/2021 01/17/20   [name], DO  ? ? ? ?Vital Signs: ?BP (!) 154/80   Pulse 93   Temp 98.1 ?F (36.7 ?C) (Oral)   Resp 15   Ht '5\' 8"'$  (1.727 m)   Wt 199 lb 8.3 oz (90.5 kg)   SpO2 100%   BMI 30.34 kg/m?  ? ?Left groin: ?Small hematoma noted at site ?1.5x2 cm at this point ?Tender to palpate firmly ?Better today per pt ?Hematoma is same/smaller than yesterday ? ? ? ?Imaging: ?CT Head Wo Contrast ? ?Result Date: 07/21/2021 ?CLINICAL DATA:  Altered mental status EXAM: CT HEAD WITHOUT CONTRAST TECHNIQUE: Contiguous axial images were obtained from the base of the skull through the vertex without intravenous contrast. RADIATION DOSE REDUCTION: This exam was performed according to the departmental dose-optimization program which includes automated exposure control, adjustment of the mA and/or kV according to patient size and/or use of iterative reconstruction technique. COMPARISON:  02/22/2020 FINDINGS: Brain: No acute intracranial findings are seen. There is prominence of third and both lateral ventricles with no significant interval change. Cortical sulci are prominent. Vascular: Unremarkable. Skull: No fracture is seen in the calvarium. Sinuses/Orbits: There is mucosal thickening in the ethmoid sinus. Other: None IMPRESSION: No acute intracranial findings are seen in noncontrast CT brain. Chronic ethmoid sinusitis. Electronically Signed   By: Elmer Picker M.D.   On: 07/21/2021 19:36  ? ?CT Angio Chest PE W and/or Wo Contrast ? ?Result Date:  07/21/2021 ?CLINICAL DATA:  Pulmonary embolism suspected EXAM: CT ANGIOGRAPHY CHEST CT ABDOMEN AND PELVIS WITH CONTRAST TECHNIQUE: Multidetector CT imaging of the chest was performed using the standard protocol during bolus administration of intravenous contrast. Multiplanar CT image reconstructions and MIPs were obtained to evaluate the vascular anatomy. Multidetector CT imaging of the abdomen and pelvis was performed using the standard protocol during bolus administration of intravenous contrast. RADIATION DOSE REDUCTION: This exam was performed according to the departmental dose-optimization program which includes automated exposure control, adjustment of the mA and/or kV according to patient size and/or use of iterative reconstruction technique. CONTRAST:  169m OMNIPAQUE IOHEXOL 350 MG/ML SOLN COMPARISON:  None. FINDINGS: CTA CHEST FINDINGS Cardiovascular: There is a saddlepulmonary embolus that extends into segmental branches bilaterally. There is evidence of right heart strain within RV/LV ratio of 1.35. The aorta is normal aside from mild atherosclerotic calcification. Mediastinum/Nodes: No mediastinal, hilar or axillary lymphadenopathy. The visualized thyroid and thoracic esophageal course are unremarkable. Lungs/Pleura: No pulmonary nodules or masses. No pleural effusion or pneumothorax. No focal airspace consolidation. No focal pleural abnormality.  Musculoskeletal: No chest wall abnormality. No acute or significant osseous findings. Review of the MIP images confirms the above findings. CT ABDOMEN and PELVIS FINDINGS Hepatobiliary: Normal hepatic contours and density. No visible biliary dilatation. Normal gallbladder. Pancreas: Normal contours without ductal dilatation. No peripancreatic fluid collection. Spleen: Normal. Adrenals/Urinary Tract: --Adrenal glands: Normal. --Right kidney/ureter: No hydronephrosis or perinephric stranding. No nephrolithiasis. No obstructing ureteral stones. --Left  kidney/ureter: No hydronephrosis or perinephric stranding. No nephrolithiasis. No obstructing ureteral stones. --Urinary bladder: Unremarkable. Stomach/Bowel: --Stomach/Duodenum: No hiatal hernia or other gastric abnormality. Normal duoden

## 2021-07-25 NOTE — Progress Notes (Signed)
Inpatient Diabetes Program Recommendations ? ?AACE/ADA: New Consensus Statement on Inpatient Glycemic Control (2015) ? ?Target Ranges:  Prepandial:   less than 140 mg/dL ?     Peak postprandial:   less than 180 mg/dL (1-2 hours) ?     Critically ill patients:  140 - 180 mg/dL  ? ?Lab Results  ?Component Value Date  ? GLUCAP 274 (H) 07/25/2021  ? HGBA1C 9.2 08/27/2019  ? ? ?Review of Glycemic Control ? Latest Reference Range & Units 07/24/21 07:36 07/24/21 10:52 07/24/21 17:06 07/24/21 22:24 07/25/21 07:17 07/25/21 11:31 07/25/21 13:06  ?Glucose-Capillary 70 - 99 mg/dL 90 239 (H) 352 (H) 335 (H) 256 (H) 450 (H) 274 (H)  ? ?Diabetes history: DM 2 ?Outpatient Diabetes medications:  ?Acarbose 100 mg tid with meals ?Glipzide 10 mg bid  ?Metformin ER 500 mg 4 tablets daily ?Lantus 46 units bid ?Current orders for Inpatient glycemic control:  ?Semglee 18 units bid ?Novolog moderate tid with meals and HS ?Prednisone 15 mg daily ?Inpatient Diabetes Program Recommendations:   ? ?Blood sugars increasing.   Please consider adding Novolog meal coverage 4 units tid with meals (hold if patient eats less than 50% or NPO).  Patient see's Peri Jefferson, [place]- at Endocrinology practice with last visit being 06/19/21.  A1C at that visit was 8.5%.  ?May need adjustment in home insulin if not eating as well.  Recommend close f/u with her after d/c.   ? ?Thanks,  ?Adah Perl, RN, BC-ADM ?Inpatient Diabetes Coordinator ?Pager [phone]  (8a-5p) ? ? ?

## 2021-07-25 NOTE — Progress Notes (Signed)
?                                  PROGRESS NOTE                                             ?                                                                                                                     ?                                         ? ? Patient Demographics:  ? ? [name], is a 71 y.o. male, DOB - 04/19/1951, YKD:[id number] ? ?Outpatient Primary MD for the patient is [name], DO    LOS - 3  Admit date - 07/21/2021   ? ?Chief Complaint  ?Patient presents with  ? Multiple c/o  ?    ? ?Brief Narrative (HPI from H&P)   71 year old gentleman with a history of diabetes, hypothyroidism, hypertension, autoimmune myositis on MTX and Pred, PTSD.  He is on chronic testosterone injection therapy.  He presented 3/24 with some generalized weakness mild shortness of breath work-up showed large saddle PE along with some hypoglycemia.  He was admitted to ICU.  Was transferred to hospitalist service on 07/25/2021. ? ? Subjective:  ? ? [name] today has, No headache, No chest pain, No abdominal pain - No Nausea, No new weakness tingling or numbness, no SOB ? ? Assessment  & Plan :  ? ? ?Acute pulmonary embolism (HCC) acute right lower extremity DVT with acute hypoxic respiratory failure due to PE.  He has chronic right lower extremity weakness and has limited mobility at baseline along with that he takes testosterone injections.  He is s/p bilateral pulmonary angiogram with suction thrombectomy by IR on 07/22/2021 currently on heparin drip will require lifelong anticoagulation and cessation of testosterone shots.  Continue titrating down oxygen and gradually advance activity, he had lactic acidosis due to hypoperfusion no sepsis. ? ? ?NSTEMI with echo showing EF of 40% with wall motion abnormality.  Currently chest pain-free, cardiology team on board, on aspirin, statin, beta-blocker and heparin drip.  Added BiDil for better blood pressure control.  Cardiology contemplating  possible right and left heart cath will defer this to cardiology team. ? ?Hypertension.  On beta-blocker dose adjusted, low-dose BiDil added as well will monitor. ? ?Acute kidney injury superimposed on CKD stage3a   - Baseline creatinine 1.3-1.4, Presented with creatinine of 1.6 likely due to hypoperfusion, acute PE, resolved after hydration.  Continue to hold ARB for now. ?  ?Anxiety  - Patient has a history of PTSD, had anxiety/panic attack  this a.m., placed on Ativan 0.5 mg IV x1 then as needed ? ?GERD (gastroesophageal reflux disease)  - Continue PPI ? ?Hypothyroidism  - Continue Synthroid ? ?Generalized weakness and deconditioning.  PT OT, fall in the hospital check head CT.  Monitor.  May require CIR placement ? ?DM2 (diabetes mellitus, type 2) ([place]) -  Patient noted to have hypoglycemia, hold glipizide, metformin.  Currently on Lantus and sliding scale.  Will add diabetic and insulin education.  Continue to monitor ? ?Lab Results  ?Component Value Date  ? HGBA1C 9.2 08/27/2019  ? ?CBG (last 3)  ?Recent Labs  ?  07/24/21 ?1706 07/24/21 ?2224 07/25/21 ?9163  ?GLUCAP 352* 335* 256*  ? ? ?  ? ?   ? ?Condition - Guarded ? ?Family Communication  :  wife bedside 07/25/21 ? ?Code Status :  Full ? ?Consults  :  PCCM, IR ? ?PUD Prophylaxis : PPI ? ? Procedures  :    ? ?CT-PA 3/24 >> saddle pulmonary embolism with RV/LV ratio 1.35 ?CT abdomen pelvis 3/24 >> no abnormality ?Lower extremity Doppler ultrasound 3/24 >> right popliteal DVT, left clear ?Bilateral pulmonary angiogram with suction thrombectomy done by IR on 07/23/2018 ? ?TTE -  1. Left ventricular ejection fraction, by estimation, is 35 to 40%. Left ventricular ejection fraction by 2D MOD biplane is 35.4 %. The left ventricle has moderately decreased function. The left ventricle demonstrates regional wall motion abnormalities (see scoring diagram/findings for description). Left ventricular diastolic parameters are consistent with Grade I diastolic dysfunction  (impaired relaxation). There is severe hypokinesis of the left ventricular, entire inferoseptal wall, inferior wall, apical segment and inferolateral wall.  2. Poorly visualized RV, however, based on TAPSE and RsVel, the right ventricular systolic function is normal.  3. The mitral valve is abnormal. Trivial mitral valve regurgitation.  4. The aortic valve is tricuspid. Aortic valve regurgitation is mild. Aortic valve sclerosis is present, with no evidence of aortic valve stenosis.  5. Aortic dilatation noted. There is mild dilatation of the ascending aorta, measuring 41 mm.  6. The inferior vena cava is normal in size with greater than 50% respiratory variability, suggesting right atrial pressure of 3 mmHg ? ?   ? ?Disposition Plan  :   ? ?Status is: Inpatient ? ? ?DVT Prophylaxis  : Heparin drip ?  ? ?Lab Results  ?Component Value Date  ? PLT 142 (L) 07/25/2021  ? ? ?Diet :  ?Diet Order   ? ?       ?  Diet heart healthy/carb modified Room service appropriate? Yes; Fluid consistency: Thin  Diet effective now       ?  ? ?  ?  ? ?  ?  ? ?Inpatient Medications ? ?Scheduled Meds: ? aspirin  81 mg Oral Daily  ? atorvastatin  80 mg Oral QHS  ? baclofen  20 mg Oral BID  ? carvedilol  3.125 mg Oral BID WC  ? Chlorhexidine Gluconate Cloth  6 each Topical Daily  ? insulin aspart  0-15 Units Subcutaneous TID AC & HS  ? insulin glargine-yfgn  18 Units Subcutaneous BID  ? levothyroxine  88 mcg Oral Daily  ? mouth rinse  15 mL Mouth Rinse BID  ? pantoprazole  40 mg Oral Daily  ? predniSONE  15 mg Oral Q breakfast  ? sertraline  100 mg Oral Daily  ? sodium chloride flush  3 mL Intravenous Q12H  ? ?Continuous Infusions: ? sodium chloride    ?  heparin Stopped (07/25/21 1941)  ? ?PRN Meds:.acetaminophen **OR** acetaminophen, haloperidol lactate, lip balm, LORazepam, oxyCODONE-acetaminophen **AND** oxyCODONE, polyvinyl alcohol ? ?Antibiotics  :   ? ?Anti-infectives (From admission, onward)  ? ? None  ? ?  ? ? ? Time Spent in minutes   30 ? ? ?Lala Lund M.D on 07/25/2021 at 10:28 AM ? ?To page go to [email or web address]  ? ?[facility] -  Office  [phone] ? ?See all Orders from today for further details ? ? ? Objective:  ? ?Vitals:  ? 07/25/21 0600 07/25/21 0720 07/25/21 0800 07/25/21 0900  ?BP: (!) 154/80  137/83 (!) 149/101  ?Pulse: 93  (!) 104 (!) 106  ?Resp: 15  (!) 23 (!) 27  ?Temp:  98.1 ?F (36.7 ?C)    ?TempSrc:  Oral    ?SpO2: 100%  98% 100%  ?Weight:      ?Height:      ? ? ?Wt Readings from Last 3 Encounters:  ?07/25/21 90.5 kg  ?02/22/20 90.1 kg  ?01/13/20 95.3 kg  ? ? ? ?Intake/Output Summary (Last 24 hours) at 07/25/2021 1028 ?Last data filed at 07/25/2021 0900 ?Gross per 24 hour  ?Intake 816.9 ml  ?Output 1775 ml  ?Net -958.1 ml  ? ? ? ?Physical Exam ? ?Awake Alert, No new F.N deficits, Normal affect ?[place].AT,PERRAL ?Supple Neck, No JVD,   ?Symmetrical Chest wall movement, Good air movement bilaterally, CTAB ?RRR,No Gallops,Rubs or new Murmurs,  ?+ve B.Sounds, Abd Soft, No tenderness,   ?No Cyanosis, right lower extremity weakness chronic, 1+ edema in right lower extremity, left groin hematoma site is stable ?  ? ? Data Review:  ? ? ?CBC ?Recent Labs  ?Lab 07/21/21 ?2032 07/22/21 ?0253 07/23/21 ?0118 07/23/21 ?5631 07/24/21 ?0022 07/25/21 ?0243  ?WBC 8.8 8.0  --  8.4 8.0 7.5  ?HGB 13.7 13.3 13.9 12.7* 13.3 11.6*  ?HCT 41.0 41.2 41.0 37.8* 40.1 35.2*  ?PLT 130* 129*  --  120* 115* 142*  ?MCV 94.5 98.6  --  94.0 96.2 94.1  ?MCH 31.6 31.8  --  31.6 31.9 31.0  ?MCHC 33.4 32.3  --  33.6 33.2 33.0  ?RDW 14.1 14.3  --  14.0 14.1 13.5  ?LYMPHSABS 0.6*  --   --   --   --   --   ?MONOABS 0.8  --   --   --   --   --   ?EOSABS 0.0  --   --   --   --   --   ?BASOSABS 0.0  --   --   --   --   --   ? ? ?Electrolytes ?Recent Labs  ?Lab 07/21/21 ?1749 07/21/21 ?1757 07/21/21 ?1800 07/21/21 ?1800 07/21/21 ?2032 07/21/21 ?2038 07/22/21 ?0253 07/23/21 ?0118 07/23/21 ?4970 07/24/21 ?0022 07/25/21 ?0243  ?NA  --   --  137   < >  --   --  140 146* 145  141 138  ?K  --   --  5.4*   < >  --   --  5.1 4.8 4.2 3.4* 4.0  ?CL  --   --  99  --   --   --  107  --  113* 111 106  ?CO2  --   --  28  --   --   --  26  --  '24 24 23  '$ ?GLUCOSE  --   --  350*  --   --

## 2021-07-25 NOTE — Progress Notes (Signed)
ANTICOAGULATION CONSULT NOTE ?Pharmacy Consult for Heparin ?Indication: pulmonary embolus s/p thrombectomy ?  ? ?Allergies  ?Allergen Reactions  ? Benazepril Cough  ? Gabapentin Swelling  ? Hydrocodone   ? Shellfish Allergy   ? ? ?Patient Measurements: ?Height: '5\' 8"'$  (172.7 cm) ?Weight: 90.5 kg (199 lb 8.3 oz) ?IBW/kg (Calculated) : 68.4 ?Heparin Dosing Weight: 88 kg ? ?Vital Signs: ?Temp: 98.1 ?F (36.7 ?C) (03/28 0720) ?Temp Source: Oral (03/28 0720) ?BP: 149/101 (03/28 0900) ?Pulse Rate: 106 (03/28 0900) ? ?Labs: ?Recent Labs  ?  07/23/21 ?6546 07/23/21 ?1841 07/24/21 ?0022 07/24/21 ?1108 07/24/21 ?1736 07/25/21 ?0243  ?HGB 12.7*  --  13.3  --   --  11.6*  ?HCT 37.8*  --  40.1  --   --  35.2*  ?PLT 120*  --  115*  --   --  142*  ?HEPARINUNFRC 0.95*   < > 0.10* 0.23* 0.33 0.43  ?CREATININE 1.08  --  1.15  --   --  1.37*  ?CKTOTAL  --   --  606*  --   --   --   ? < > = values in this interval not displayed.  ? ? ? ?Estimated Creatinine Clearance: 53.2 mL/min (A) (by C-G formula based on SCr of 1.37 mg/dL (H)). ? ? ?Assessment: ?71 y.o. male with acute saddle PE with RV strain s/p thrombectomy 3/25. Also with occlusive and non-occlusive RLE DVTs. No anticoagulation prior to admission. Pharmacy consulted for heparin.   ? ?Heparin drip was held this morning while awaiting head CT after patient fell while attempting to go to the bathroom. Imaging negative for bleed and heparin drip resumed at 1200. Heparin level was therapeutic at 0.43 this morning prior to being turned off. CBC stable. No bleeding or issues with infusion per discussion with RN. ? ?Goal of Therapy:  ?Heparin level 0.3-0.7 units/ml ?Monitor platelets by anticoagulation protocol: Yes ?  ?Plan:  ?Heparin infusion resumed at previous therapeutic rate 1250 units/hr ?Check 6hr heparin level from resumption ?Monitor daily CBC, signs/symptoms of bleeding ?Cardiology planning to consider outpatient cath once further removed from thrombotic event ?F/u long-term  anticoagulation - plan transition to apixaban 3/29 per MD ? ? ?Arturo Morton, PharmD, BCPS ?Please check AMION for all [place] contact numbers ?Clinical Pharmacist ?07/25/2021 11:59 AM ?

## 2021-07-25 NOTE — Progress Notes (Signed)
Patient found on floor by wife, after he called her on her cell phone to let her know he fell while trying to go to bathroom alone. Pt did not use call light to ask for assistance. Dr. Gaylan Gerold at bedside, Dr. Candiss Norse notified by phone. Patient assisted up to bed by three RN and one NT. Patient assisted to walk ten steps with RN. Patient doesn't report any pain when walking. Head CT ordered by Dr. Candiss Norse.  ?

## 2021-07-25 NOTE — Discharge Instructions (Addendum)
Follow with Primary MD [name], DO in 7 days, stop taking testosterone supplements as they can increase your chances of getting a blood clot. ? ?Get CBC, CMP, 2 view Chest X ray -  checked next visit within 1 week by Primary MD  ? ?Activity: As tolerated with Full fall precautions use walker/cane & assistance as needed ? ?Disposition Home    ? ?Diet: Heart Healthy Low Carb ? ?Accuchecks 4 times/day, Once in AM empty stomach and then before each meal. ?Log in all results and show them to your Prim.MD in 3 days. ?If any glucose reading is under 80 or above 300 call your Prim MD immidiately. ?Follow Low glucose instructions for glucose under 80 as instructed. ? ?Special Instructions: If you have smoked or chewed Tobacco  in the last 2 yrs please stop smoking, stop any regular Alcohol  and or any Recreational drug use. ? ?On your next visit with your primary care physician please Get Medicines reviewed and adjusted. ? ?Please request your Prim.MD to go over all Hospital Tests and Procedure/Radiological results at the follow up, please get all Hospital records sent to your Prim MD by signing hospital release before you go home. ? ?If you experience worsening of your admission symptoms, develop shortness of breath, life threatening emergency, suicidal or homicidal thoughts you must seek medical attention immediately by calling 911 or calling your MD immediately  if symptoms less severe. ? ?You Must read complete instructions/literature along with all the possible adverse reactions/side effects for all the Medicines you take and that have been prescribed to you. Take any new Medicines after you have completely understood and accpet all the possible adverse reactions/side effects.  ?   ? ? ? ?Information on my medicine - ELIQUIS? (apixaban) ? ?This medication education was reviewed with me or my healthcare representative as part of my discharge preparation.   ? ?Why was Eliquis? prescribed for you? ?Eliquis? was  prescribed to treat blood clots that may have been found in the veins of your legs (deep vein thrombosis) or in your lungs (pulmonary embolism) and to reduce the risk of them occurring again. ? ?What do You need to know about Eliquis? ? ?The starting dose is 10 mg (two 5 mg tablets) taken TWICE daily for the FIRST SEVEN (7) DAYS, then on 08/02/2021 at 8 PM, the dose is reduced to ONE 5 mg tablet taken TWICE daily.  Eliquis? may be taken with or without food.  ? ?Try to take the dose about the same time in the morning and in the evening. If you have difficulty swallowing the tablet whole please discuss with your pharmacist how to take the medication safely. ? ?Take Eliquis? exactly as prescribed and DO NOT stop taking Eliquis? without talking to the doctor who prescribed the medication.  Stopping may increase your risk of developing a new blood clot.  Refill your prescription before you run out. ? ?After discharge, you should have regular check-up appointments with your healthcare provider that is prescribing your Eliquis?. ?   ?What do you do if you miss a dose? ?If a dose of ELIQUIS? is not taken at the scheduled time, take it as soon as possible on the same day and twice-daily administration should be resumed. The dose should not be doubled to make up for a missed dose. ? ?Important Safety Information ?A possible side effect of Eliquis? is bleeding. You should call your healthcare provider right away if you experience any of the following: ?Bleeding  from an injury or your nose that does not stop. ?Unusual colored urine (red or dark brown) or unusual colored stools (red or black). ?Unusual bruising for unknown reasons. ?A serious fall or if you hit your head (even if there is no bleeding). ? ?Some medicines may interact with Eliquis? and might increase your risk of bleeding or clotting while on Eliquis?Marland Kitchen To help avoid this, consult your healthcare provider or pharmacist prior to using any new prescription or  non-prescription medications, including herbals, vitamins, non-steroidal anti-inflammatory drugs (NSAIDs) and supplements. ? ?This website has more information on Eliquis? (apixaban): http://www.eliquis.com/eliquis/home  ?

## 2021-07-25 NOTE — Progress Notes (Signed)
Interventional Radiology Brief Note: ? ?Vascular US performed this afternoon demonstrates a 1.3 x 2.2 cm hematoma at the area of venous access.  No pseudoaneurysm. ? ?PA to bedside.  Removed pursestring suture.  Compression of hematoma attempted as long as patient could tolerate.  Recommend limited mobility vs. Bed rest today with resume of activity tomorrow as long as no new oozing or enlargement of hematoma is seen.  ? ?If oozing or extension is noted, RN instructed to hold pressure and contact IR.    ? ?Brynda Greathouse, MS RD PA-C ? ? ?

## 2021-07-25 NOTE — Progress Notes (Signed)
ANTICOAGULATION CONSULT NOTE ?Pharmacy Consult for Heparin ?Indication: pulmonary embolus s/p thrombectomy ?  ? ?Allergies  ?Allergen Reactions  ? Benazepril Cough  ? Gabapentin Swelling  ? Hydrocodone   ? Shellfish Allergy   ? ? ?Patient Measurements: ?Height: '5\' 8"'$  (172.7 cm) ?Weight: 90.5 kg (199 lb 8.3 oz) ?IBW/kg (Calculated) : 68.4 ?Heparin Dosing Weight: 88 kg ? ?Vital Signs: ?Temp: 98 ?F (36.7 ?C) (03/28 1622) ?Temp Source: Oral (03/28 1622) ?BP: 121/66 (03/28 1940) ?Pulse Rate: 89 (03/28 1940) ? ?Labs: ?Recent Labs  ?  07/23/21 ?2878 07/23/21 ?1841 07/24/21 ?0022 07/24/21 ?1108 07/24/21 ?1736 07/25/21 ?0243 07/25/21 ?1847  ?HGB 12.7*  --  13.3  --   --  11.6*  --   ?HCT 37.8*  --  40.1  --   --  35.2*  --   ?PLT 120*  --  115*  --   --  142*  --   ?HEPARINUNFRC 0.95*   < > 0.10*   < > 0.33 0.43 <0.10*  ?CREATININE 1.08  --  1.15  --   --  1.37*  --   ?CKTOTAL  --   --  606*  --   --   --   --   ? < > = values in this interval not displayed.  ? ? ? ?Estimated Creatinine Clearance: 53.2 mL/min (A) (by C-G formula based on SCr of 1.37 mg/dL (H)). ? ? ?Assessment: ?71 y.o. male with acute saddle PE with RV strain s/p thrombectomy 3/25. Also with occlusive and non-occlusive RLE DVTs. No anticoagulation prior to admission. Pharmacy consulted for heparin.   ? ?Heparin drip was held this morning while awaiting head CT after patient fell while attempting to go to the bathroom. Imaging negative for bleed and heparin drip resumed at 1200. Heparin level this afternoon is undetectable, no other infusion issues noted by RN aside from earlier this am. Groin site hematoma noted 3/27 so will defer bolus ? ?Goal of Therapy:  ?Heparin level 0.3-0.7 units/ml ?Monitor platelets by anticoagulation protocol: Yes ?  ?Plan:  ?Increase heparin to 1450 units/h ?Repeat heparin level in 6h ? ?Arrie Senate, PharmD, BCPS, BCCP ?Clinical Pharmacist ?[phone] ?Please check AMION for all [facility] numbers ?07/25/2021 ? ?

## 2021-07-25 NOTE — Care Management (Signed)
?  Transition of Care (TOC) Screening Note ? ? ?Patient Details  ?Name: [name] ?Date of Birth: 1950/12/31 ? ? ?Transition of Care (TOC) CM/SW Contact:    ?Carles Collet, RN ?Phone Number: ?07/25/2021, 10:33 AM ? ? ? ?Transition of Care Department [facility]) has reviewed patient and we will continue to monitor patient advancement through interdisciplinary progression rounds.  ?

## 2021-07-25 NOTE — Progress Notes (Signed)
OT Cancellation Note ? ?Patient Details ?Name: [name] ?MRN: [id number] ?DOB: Mar 04, 1951 ? ? ?Cancelled Treatment:    Reason Eval/Treat Not Completed: Patient at procedure or test/ unavailable Currently being transported to CT. Will follow-up for OT eval as schedule permits. ? ?Layla Maw ?07/25/2021, 10:32 AM ?

## 2021-07-25 NOTE — Progress Notes (Signed)
Physical Therapy Treatment ?Patient Details ?Name: [name] ?MRN: [id number] ?DOB: Jan 11, 1951 ?Today's Date: 07/25/2021 ? ? ?History of Present Illness 71 y.o. male presents to ED 3/24 with complaints of 2 bouts of increased confuseion with low glucose, and R LE pain. In ED , BP in 02-725D systolic and HR in 664-403K CT PE study showed saddle pulmonary embolus with right heart strain and RV to LV ratio of 1.35 consistent with at least submassive PE. Venous Dopplers showed occlusive right popliteal DVT s/p Bilateral pulmonary angiogram and thrombectomy 07/22/21  PMH: diabetes, hypertension, anemia, hypothyroidism, BPH, GERD, PTSD, autoimmune myositis presenting with weakness, pain, confusion. ? ?  ?PT Comments  ? ? Able to perform more in depth assessment now that pt allowed to mobilize. Pt fell earlier this AM when trying to get OOB unassisted. Pt reported RLE pain as he did yesterday with movement. Updated recommendation to acute inpatient rehab as I think he could make good progress and return home with wife after completing that.    ?Recommendations for follow up therapy are one component of a multi-disciplinary discharge planning process, led by the attending physician.  Recommendations may be updated based on patient status, additional functional criteria and insurance authorization. ? ?Follow Up Recommendations ? Acute inpatient rehab (3hours/day) ?  ?  ?Assistance Recommended at Discharge Frequent or constant Supervision/Assistance  ?Patient can return home with the following Assistance with cooking/housework;Direct supervision/assist for medications management;Direct supervision/assist for financial management;Assist for transportation;Help with stairs or ramp for entrance;A little help with walking and/or transfers;A little help with bathing/dressing/bathroom ?  ?Equipment Recommendations ? BSC/3in1  ?  ?Recommendations for Other Services Rehab consult ? ? ?  ?Precautions / Restrictions  Precautions ?Precautions: Fall ?Restrictions ?Weight Bearing Restrictions: No  ?  ? ?Mobility ? Bed Mobility ?  ?  ?  ?  ?  ?  ?  ?General bed mobility comments: Pt up in chair ?  ? ?Transfers ?Overall transfer level: Needs assistance ?Equipment used: Rollator (4 wheels) ?Transfers: Sit to/from Stand ?Sit to Stand: Min assist ?  ?  ?  ?  ?  ?General transfer comment: Assist to bring hips up and for balance. Repeated verbal cues for hand placement ?  ? ?Ambulation/Gait ?Ambulation/Gait assistance: Min assist ?Gait Distance (Feet): 75 Feet ?Assistive device: Rollator (4 wheels) ?Gait Pattern/deviations: Step-through pattern, Decreased stance time - right, Antalgic ?Gait velocity: decr ?Gait velocity interpretation: <1.31 ft/sec, indicative of household ambulator ?  ?General Gait Details: Assist for balance. ? ? ?Stairs ?  ?  ?  ?  ?  ? ? ?Wheelchair Mobility ?  ? ?Modified Rankin (Stroke Patients Only) ?  ? ? ?  ?Balance Overall balance assessment: Needs assistance ?Sitting-balance support: No upper extremity supported, Feet supported ?Sitting balance-Leahy Scale: Good ?  ?  ?Standing balance support: Bilateral upper extremity supported, During functional activity, Reliant on assistive device for balance ?Standing balance-Leahy Scale: Poor ?Standing balance comment: rollator and min guard for static standing ?  ?  ?  ?  ?  ?  ?  ?  ?  ?  ?  ?  ? ?  ?Cognition Arousal/Alertness: Awake/alert ?Behavior During Therapy: Flat affect ?Overall Cognitive Status: Impaired/Different from baseline ?Area of Impairment: Following commands, Safety/judgement, Awareness, Problem solving, Memory ?  ?  ?  ?  ?  ?  ?  ?  ?  ?  ?Memory: Decreased short-term memory ?Following Commands: Follows multi-step commands inconsistently, Follows one step commands consistently, Follows one step commands with  increased time, Follows multi-step commands with increased time ?Safety/Judgement: Decreased awareness of deficits, Decreased awareness of  safety ?Awareness: Emergent ?Problem Solving: Slow processing, Difficulty sequencing, Requires verbal cues, Requires tactile cues ?  ?  ?  ? ?  ?Exercises   ? ?  ?General Comments General comments (skin integrity, edema, etc.): VSS on RA ?  ?  ? ?Pertinent Vitals/Pain Pain Assessment ?Pain Assessment: Faces ?Faces Pain Scale: Hurts little more ?Pain Location: R LE with movement ?Pain Descriptors / Indicators: Grimacing, Guarding ?Pain Intervention(s): Limited activity within patient's tolerance, Monitored during session  ? ? ?Home Living   ?  ?  ?  ?  ?  ?  ?  ?  ?  ?   ?  ?Prior Function    ?  ?  ?   ? ?PT Goals (current goals can now be found in the care plan section) Acute Rehab PT Goals ?Patient Stated Goal: go home ?PT Goal Formulation: With patient ?Time For Goal Achievement: 08/07/21 ?Potential to Achieve Goals: Good ?Progress towards PT goals: Progressing toward goals;Goals met and updated - see care plan ? ?  ?Frequency ? ? ? Min 3X/week ? ? ? ?  ?PT Plan    ? ? ?Co-evaluation   ?  ?  ?  ?  ? ?  ?AM-PAC PT "6 Clicks" Mobility   ?Outcome Measure ? Help needed turning from your back to your side while in a flat bed without using bedrails?: A Little ?Help needed moving from lying on your back to sitting on the side of a flat bed without using bedrails?: A Little ?Help needed moving to and from a bed to a chair (including a wheelchair)?: A Little ?Help needed standing up from a chair using your arms (e.g., wheelchair or bedside chair)?: A Little ?Help needed to walk in hospital room?: A Little ?Help needed climbing 3-5 steps with a railing? : Total ?6 Click Score: 16 ? ?  ?End of Session Equipment Utilized During Treatment: Gait belt ?Activity Tolerance: Patient tolerated treatment well ?Patient left: with call bell/phone within reach;in chair;with chair alarm set;with family/visitor present ?Nurse Communication: Mobility status ?PT Visit Diagnosis: Other abnormalities of gait and mobility (R26.89);Muscle  weakness (generalized) (M62.81);Difficulty in walking, not elsewhere classified (R26.2);Pain ?Pain - Right/Left: Right ?Pain - part of body: Leg ?  ? ? ?Time: 3833-3832 ?PT Time Calculation (min) (ACUTE ONLY): 18 min ? ?Charges:  $Gait Training: 8-22 mins          ?          ? ?[facility] PT ?Acute Rehabilitation Services ?Pager [phone] ?Office [phone] ? ? ? ?Shary Decamp [facility] ?07/25/2021, 10:21 AM ? ?

## 2021-07-25 NOTE — Progress Notes (Signed)
Heparin gtt restarted and aspirin given orally per Dr. Candiss Norse following negative head CT results.  ? ?CBG of 450 at 11:31 am. Dr. Candiss Norse notified. Dr. Candiss Norse instructed RN to give 15 units of insulin per sliding scale plus additional 5 units of insulin for a total of 20 units insulin now and to repeat CBG in two hours. ?

## 2021-07-25 NOTE — Progress Notes (Signed)
? ?Progress Note ? ?Patient Name: [name] ?Date of Encounter: 07/25/2021 ? ?[facility] Cardiologist: None  ? ?Subjective  ? ?Denies chest pain or dyspnea.  Had fall this morning trying to get up and go to bathroom ? ?Inpatient Medications  ?  ?Scheduled Meds: ? aspirin  81 mg Oral Daily  ? atorvastatin  80 mg Oral QHS  ? baclofen  20 mg Oral BID  ? carvedilol  3.125 mg Oral Once  ? carvedilol  6.25 mg Oral BID WC  ? Chlorhexidine Gluconate Cloth  6 each Topical Daily  ? insulin aspart  0-15 Units Subcutaneous TID AC & HS  ? insulin glargine-yfgn  18 Units Subcutaneous BID  ? levothyroxine  88 mcg Oral Daily  ? losartan  25 mg Oral Daily  ? mouth rinse  15 mL Mouth Rinse BID  ? pantoprazole  40 mg Oral Daily  ? predniSONE  15 mg Oral Q breakfast  ? sertraline  100 mg Oral Daily  ? sodium chloride flush  3 mL Intravenous Q12H  ? ?Continuous Infusions: ? sodium chloride    ? heparin Stopped (07/25/21 0836)  ? ?PRN Meds: ?acetaminophen **OR** acetaminophen, haloperidol lactate, lip balm, LORazepam, oxyCODONE-acetaminophen **AND** oxyCODONE, polyvinyl alcohol  ? ?Vital Signs  ?  ?Vitals:  ? 07/25/21 0600 07/25/21 0720 07/25/21 0800 07/25/21 0900  ?BP: (!) 154/80  137/83 (!) 149/101  ?Pulse: 93  (!) 104 (!) 106  ?Resp: 15  (!) 23 (!) 27  ?Temp:  98.1 ?F (36.7 ?C)    ?TempSrc:  Oral    ?SpO2: 100%  98% 100%  ?Weight:      ?Height:      ? ? ?Intake/Output Summary (Last 24 hours) at 07/25/2021 1119 ?Last data filed at 07/25/2021 0900 ?Gross per 24 hour  ?Intake 806.42 ml  ?Output 1775 ml  ?Net -968.58 ml  ? ? ? ?  07/25/2021  ?  4:00 AM 07/24/2021  ?  6:00 AM 07/21/2021  ?  7:37 PM  ?Last 3 Weights  ?Weight (lbs) 199 lb 8.3 oz 201 lb 1 oz 208 lb 15.9 oz  ?Weight (kg) 90.5 kg 91.2 kg 94.8 kg  ?   ? ?Telemetry  ?  ?Sinus 90-100s - Personally Reviewed ? ?ECG  ?  ?No new ECG - Personally Reviewed ? ?Physical Exam  ? ?GEN: No acute distress.   ?Neck: No JVD ?Cardiac: RRR, no murmurs ?Respiratory: Clear to auscultation  bilaterally. ?GI: Soft, nontender ?MS: No edema; No deformity. ?Neuro:  Nonfocal  ?Psych: Normal affect  ? ?Labs  ?  ?High Sensitivity Troponin:   ?Recent Labs  ?Lab 07/21/21 ?1749 07/21/21 ?2010 07/22/21 ?0253  ?TROPONINIHS 852* 1,507* 1,450*  ? ?   ?Chemistry ?Recent Labs  ?Lab 07/21/21 ?1800 07/22/21 ?0253 07/23/21 ?0118 07/23/21 ?8413 07/24/21 ?0022 07/25/21 ?0243  ?NA 137 140   < > 145 141 138  ?K 5.4* 5.1   < > 4.2 3.4* 4.0  ?CL 99 107  --  113* 111 106  ?CO2 28 26  --  '24 24 23  '$ ?GLUCOSE 350* 131*  --  149* 71 294*  ?BUN 20 13  --  '12 10 19  '$ ?CREATININE 1.63* 1.35*  --  1.08 1.15 1.37*  ?CALCIUM 10.0 9.8  --  9.2 9.2 8.7*  ?PROT 6.4* 5.3*  --   --  5.6*  --   ?ALBUMIN 3.7 3.1*  --   --  3.2*  --   ?AST 42* 32  --   --  41  --   ?ALT 27 25  --   --  30  --   ?ALKPHOS 47 45  --   --  47  --   ?BILITOT 1.2 0.8  --   --  1.0  --   ?GFRNONAA 44* 56*  --  >60 >60 55*  ?ANIONGAP 10 7  --  '8 6 9  '$ ? < > = values in this interval not displayed.  ? ?  ?Lipids  ?Recent Labs  ?Lab 07/24/21 ?0022  ?CHOL 182  ?TRIG 132  ?HDL 41  ?LDLCALC 115*  ?CHOLHDL 4.4  ? ?  ?Hematology ?Recent Labs  ?Lab 07/23/21 ?8315 07/24/21 ?0022 07/25/21 ?0243  ?WBC 8.4 8.0 7.5  ?RBC 4.02* 4.17* 3.74*  ?HGB 12.7* 13.3 11.6*  ?HCT 37.8* 40.1 35.2*  ?MCV 94.0 96.2 94.1  ?MCH 31.6 31.9 31.0  ?MCHC 33.6 33.2 33.0  ?RDW 14.0 14.1 13.5  ?PLT 120* 115* 142*  ? ? ?Thyroid No results for input(s): TSH, FREET4 in the last 168 hours.  ?BNP ?Recent Labs  ?Lab 07/21/21 ?1749 07/24/21 ?0022  ?BNP 64.8 119.6*  ? ?  ?DDimer No results for input(s): DDIMER in the last 168 hours.  ? ?Radiology  ?  ?CT HEAD WO CONTRAST (5MM) ? ?Result Date: 07/25/2021 ?CLINICAL DATA:  Seizure disorder.  Fell from bed. EXAM: CT HEAD WITHOUT CONTRAST TECHNIQUE: Contiguous axial images were obtained from the base of the skull through the vertex without intravenous contrast. RADIATION DOSE REDUCTION: This exam was performed according to the departmental dose-optimization program which  includes automated exposure control, adjustment of the mA and/or kV according to patient size and/or use of iterative reconstruction technique. COMPARISON:  07/21/2021 FINDINGS: Brain: Generalized atrophy. Mild ventricular enlargement, stable. This is likely due to atrophy. Mild hypodensity in the cerebral white matter bilaterally unchanged and appears chronic. Negative for acute infarct, hemorrhage, mass. Vascular: Negative for hyperdense vessel Skull: Negative Sinuses/Orbits: Mucosal edema throughout the paranasal sinuses. Negative orbit Other: None IMPRESSION: No acute abnormality no change from the recent study. Mild atrophy and mild chronic microvascular ischemic change in the white matter. Electronically Signed   By: Franchot Gallo M.D.   On: 07/25/2021 10:50  ? ?DG CHEST PORT 1 VIEW ? ?Result Date: 07/24/2021 ?CLINICAL DATA:  History of pulmonary embolism, DVT. EXAM: PORTABLE CHEST 1 VIEW COMPARISON:  CT angiogram dated July 21, 2021 FINDINGS: The heart is mildly enlarged. Lungs are clear without evidence of focal consolidation or pleural effusion. Bilateral glenohumeral and acromioclavicular osteoarthritis. Thoracic spondylosis. No acute osseous abnormality. IMPRESSION: 1. Stable cardiomegaly. 2. No focal consolidation or pleural effusion. Electronically Signed   By: Keane Police D.O.   On: 07/24/2021 08:44  ? ?VAS [place] GROIN PSEUDOANEURYSM ? ?Result Date: 07/24/2021 ? ARTERIAL PSEUDOANEURYSM  Patient Name:  [name]  Date of Exam:   07/24/2021 Medical Rec #: [id number]    Accession #:    [id number] Date of Birth: Sep 26, 1950     Patient Gender: M Patient Age:   70 years Exam Location:  [facility] Procedure:      VAS [place] GROIN PSEUDOANEURYSM Referring Phys: Sherlie Ban MATTHEWS --------------------------------------------------------------------------------  Exam: Left groin Indications: Patient complains of groin pain and bruising. History: S/p catheterization. Comparison Study: No prior study Performing  Technologist: Maudry Mayhew MHA, RDMS, RVT, RDCS  Examination Guidelines: A complete evaluation includes B-mode imaging, spectral Doppler, color Doppler, and power Doppler as needed of all accessible portions of each vessel. Bilateral testing is considered an  integral part of a complete examination. Limited examinations for reoccurring indications may be performed as noted. +-----------+----------+---------+------+----------+ Left DuplexPSV (cm/s)Waveform PlaqueComment(s) +-----------+----------+---------+------+----------+ CFA           146    triphasic                 +-----------+----------+---------+------+----------+ PFA            77    biphasic                  +-----------+----------+---------+------+----------+ Prox SFA      172    triphasic                 +-----------+----------+---------+------+----------+ Left Vein comments: patent left CFV  Summary: No evidence of pseudoaneurysm, AVF or DVT. A mixed echogenic structure measuring approximately 2.2 cm x 1.3 cm is visualized at the left groin with ultrasound characteristics of a hematoma.  Diagnosing physician: Servando Snare MD Electronically signed by Servando Snare MD on 07/24/2021 at 4:17:31 PM.    --------------------------------------------------------------------------------    Final    ? ?Cardiac Studies  ? ?Echo 07/23/21: ? 1. Left ventricular ejection fraction, by estimation, is 35 to 40%. Left  ?ventricular ejection fraction by 2D MOD biplane is 35.4 %. The left  ?ventricle has moderately decreased function. The left ventricle  ?demonstrates regional wall motion abnormalities  ?(see scoring diagram/findings for description). Left ventricular diastolic  ?parameters are consistent with Grade I diastolic dysfunction (impaired  ?relaxation). There is severe hypokinesis of the left ventricular, entire  ?inferoseptal wall, inferior wall,  ?apical segment and inferolateral wall.  ? 2. Poorly visualized RV, however, based on  TAPSE and RsVel, the right  ?ventricular systolic function is normal.  ? 3. The mitral valve is abnormal. Trivial mitral valve regurgitation.  ? 4. The aortic valve is tricuspid. Aortic valve regurgitation is mild.  ?Aortic valve sclerosis is

## 2021-07-26 DIAGNOSIS — I2602 Saddle embolus of pulmonary artery with acute cor pulmonale: Secondary | ICD-10-CM | POA: Diagnosis not present

## 2021-07-26 DIAGNOSIS — I214 Non-ST elevation (NSTEMI) myocardial infarction: Secondary | ICD-10-CM | POA: Diagnosis not present

## 2021-07-26 DIAGNOSIS — I5041 Acute combined systolic (congestive) and diastolic (congestive) heart failure: Secondary | ICD-10-CM | POA: Diagnosis not present

## 2021-07-26 LAB — COMPREHENSIVE METABOLIC PANEL
ALT: 37 U/L (ref 0–44)
AST: 32 U/L (ref 15–41)
Albumin: 2.9 g/dL — ABNORMAL LOW (ref 3.5–5.0)
Alkaline Phosphatase: 47 U/L (ref 38–126)
Anion gap: 10 (ref 5–15)
BUN: 17 mg/dL (ref 8–23)
CO2: 25 mmol/L (ref 22–32)
Calcium: 8.8 mg/dL — ABNORMAL LOW (ref 8.9–10.3)
Chloride: 107 mmol/L (ref 98–111)
Creatinine, Ser: 1.46 mg/dL — ABNORMAL HIGH (ref 0.61–1.24)
GFR, Estimated: 51 mL/min — ABNORMAL LOW (ref >60)
Glucose, Bld: 181 mg/dL — ABNORMAL HIGH (ref 70–99)
Potassium: 3.8 mmol/L (ref 3.5–5.1)
Sodium: 142 mmol/L (ref 135–145)
Total Bilirubin: 0.9 mg/dL (ref 0.3–1.2)
Total Protein: 5.2 g/dL — ABNORMAL LOW (ref 6.5–8.1)

## 2021-07-26 LAB — CBC WITH DIFFERENTIAL/PLATELET
Abs Immature Granulocytes: 0.05 K/uL (ref 0.00–0.07)
Basophils Absolute: 0 K/uL (ref 0.0–0.1)
Basophils Relative: 0 %
Eosinophils Absolute: 0.1 K/uL (ref 0.0–0.5)
Eosinophils Relative: 1 %
HCT: 34.2 % — ABNORMAL LOW (ref 39.0–52.0)
Hemoglobin: 11.4 g/dL — ABNORMAL LOW (ref 13.0–17.0)
Immature Granulocytes: 1 %
Lymphocytes Relative: 18 %
Lymphs Abs: 1.2 K/uL (ref 0.7–4.0)
MCH: 31.4 pg (ref 26.0–34.0)
MCHC: 33.3 g/dL (ref 30.0–36.0)
MCV: 94.2 fL (ref 80.0–100.0)
Monocytes Absolute: 0.6 K/uL (ref 0.1–1.0)
Monocytes Relative: 9 %
Neutro Abs: 4.8 K/uL (ref 1.7–7.7)
Neutrophils Relative %: 71 %
Platelets: 165 K/uL (ref 150–400)
RBC: 3.63 MIL/uL — ABNORMAL LOW (ref 4.22–5.81)
RDW: 13.3 % (ref 11.5–15.5)
WBC: 6.8 K/uL (ref 4.0–10.5)
nRBC: 0 % (ref 0.0–0.2)

## 2021-07-26 LAB — GLUCOSE, CAPILLARY
Glucose-Capillary: 241 mg/dL — ABNORMAL HIGH (ref 70–99)
Glucose-Capillary: 147 mg/dL — ABNORMAL HIGH (ref 70–99)
Glucose-Capillary: 260 mg/dL — ABNORMAL HIGH (ref 70–99)
Glucose-Capillary: 123 mg/dL — ABNORMAL HIGH (ref 70–99)
Glucose-Capillary: 261 mg/dL — ABNORMAL HIGH (ref 70–99)
Glucose-Capillary: 254 mg/dL — ABNORMAL HIGH (ref 70–99)
Glucose-Capillary: 266 mg/dL — ABNORMAL HIGH (ref 70–99)
Glucose-Capillary: 223 mg/dL — ABNORMAL HIGH (ref 70–99)

## 2021-07-26 LAB — CULTURE, BLOOD (ROUTINE X 2)
Culture: NO GROWTH
Culture: NO GROWTH
Special Requests: ADEQUATE

## 2021-07-26 LAB — MAGNESIUM: Magnesium: 1.9 mg/dL (ref 1.7–2.4)

## 2021-07-26 LAB — HEPARIN LEVEL (UNFRACTIONATED): Heparin Unfractionated: <0.1 [IU]/mL — ABNORMAL LOW (ref 0.30–0.70)

## 2021-07-26 LAB — BRAIN NATRIURETIC PEPTIDE: B Natriuretic Peptide: 51.2 pg/mL (ref 0.0–100.0)

## 2021-07-26 MED ORDER — APIXABAN 5 MG PO TABS: 5.0000 mg | ORAL_TABLET | Freq: Two times a day (BID) | ORAL | Status: DC

## 2021-07-26 MED ORDER — HYDROXYZINE HCL 25 MG PO TABS
25.0000 mg | ORAL_TABLET | Freq: Once | ORAL | Status: AC
  Administered 2021-07-26: 25 mg via ORAL
  Filled 2021-07-26: qty 1

## 2021-07-26 MED ORDER — QUETIAPINE FUMARATE 25 MG PO TABS
25.0000 mg | ORAL_TABLET | Freq: Every day | ORAL | Status: DC
Start: 2021-07-26 — End: 2021-07-27
  Administered 2021-07-26: 25 mg via ORAL
  Filled 2021-07-26: qty 1

## 2021-07-26 MED ORDER — APIXABAN 5 MG PO TABS
10.0000 mg | ORAL_TABLET | Freq: Two times a day (BID) | ORAL | Status: DC
  Administered 2021-07-26: 10 mg via ORAL
  Filled 2021-07-26: qty 2

## 2021-07-26 MED ORDER — INSULIN GLARGINE-YFGN 100 UNIT/ML ~~LOC~~ SOLN
32.0000 [IU] | Freq: Two times a day (BID) | SUBCUTANEOUS | Status: DC
  Administered 2021-07-26 – 2021-07-27 (×3): 32 [IU] via SUBCUTANEOUS
  Filled 2021-07-26 (×4): qty 0.32

## 2021-07-26 MED ORDER — LORATADINE 10 MG PO TABS
10.0000 mg | ORAL_TABLET | Freq: Every day | ORAL | Status: DC
  Administered 2021-07-26 – 2021-07-27 (×2): 10 mg via ORAL
  Filled 2021-07-26 (×2): qty 1

## 2021-07-26 MED ORDER — HYDROXYZINE HCL 10 MG PO TABS
10.0000 mg | ORAL_TABLET | ORAL | Status: AC
  Administered 2021-07-26: 10 mg via ORAL
  Filled 2021-07-26: qty 1

## 2021-07-26 MED ORDER — CLONAZEPAM 0.5 MG PO TBDP
0.5000 mg | ORAL_TABLET | Freq: Every day | ORAL | Status: DC
  Administered 2021-07-26 – 2021-07-27 (×2): 0.5 mg via ORAL
  Filled 2021-07-26 (×2): qty 1

## 2021-07-26 NOTE — Progress Notes (Signed)
?                                  PROGRESS NOTE                                             ?                                                                                                                     ?                                         ? ? Patient Demographics:  ? ? [name], is a 71 y.o. male, DOB - [birth year], TOI:[id number] ? ?Outpatient Primary MD for the patient is [name], DO    LOS - 4  Admit date - 07/21/2021   ? ?Chief Complaint  ?Patient presents with  ? Multiple c/o  ?    ? ?Brief Narrative (HPI from H&P)   70 year old gentleman with a history of diabetes, hypothyroidism, hypertension, autoimmune myositis on MTX and Pred, PTSD.  He is on chronic testosterone injection therapy.  He presented 3/24 with some generalized weakness mild shortness of breath work-up showed large saddle PE along with some hypoglycemia.  He was admitted to ICU.  Was transferred to hospitalist service on 07/25/2021. ? ? Subjective:  ? ?Patient in bed, appears comfortable, denies any headache, no fever, no chest pain or pressure, no shortness of breath , no abdominal pain. No new focal weakness. ? ? Assessment  & Plan :  ? ? ?Acute pulmonary embolism (HCC) acute right lower extremity DVT with acute hypoxic respiratory failure due to PE.  He has chronic right lower extremity weakness and has limited mobility at baseline along with that he takes testosterone injections.  He is s/p bilateral pulmonary angiogram with suction thrombectomy by IR on 07/22/2021 currently on heparin drip >> Eliquis will require lifelong anticoagulation and cessation of testosterone shots.  Continue titrating down oxygen and gradually advance activity, he had lactic acidosis due to hypoperfusion no sepsis. ? ? ?NSTEMI with echo showing EF of 40% with wall motion abnormality.  Currently chest pain-free, cardiology team on board, on aspirin, statin, beta-blocker and heparin drip.  Added BiDil for better blood  pressure control.  Cardiology contemplating possible right and left heart cath will defer this to cardiology team. ? ?Hypertension.  On beta-blocker dose adjusted, low-dose BiDil added as well will monitor. ? ?Acute kidney injury superimposed on CKD stage3a   - Baseline creatinine 1.3-1.4, Presented with creatinine of 1.6 likely due to hypoperfusion, acute PE, resolved after hydration.  Continue to hold ARB for now. ?  ?Anxiety  - Patient has a history  of PTSD, had anxiety/panic attack this a.m., placed on Ativan 0.5 mg IV x1 then as needed ? ?GERD (gastroesophageal reflux disease)  - Continue PPI ? ?Hypothyroidism  - Continue Synthroid ? ?Generalized weakness and deconditioning.  PT OT, fall in the hospital on 07/25/2021, no new aches or pains, no headache, head CT unremarkable.  At baseline.  Refuses SNF or CIR family want to go home with home health. ? ?DM2 (diabetes mellitus, type 2) ([place]) -  Patient noted to have hypoglycemia, hold glipizide, metformin.  Currently on Lantus and sliding scale.  Will add diabetic and insulin education.  Continue to monitor ? ?Lab Results  ?Component Value Date  ? HGBA1C 7.3 (H) 07/25/2021  ? ?CBG (last 3)  ?Recent Labs  ?  07/26/21 ?0107 07/26/21 ?2725 07/26/21 ?3664  ?GLUCAP 241* 147* 260*  ? ? ?  ? ?   ? ?Condition - Guarded ? ?Family Communication  :  wife bedside 07/25/21, 07/26/21 + son - phone,  ? ?Code Status :  Full ? ?Consults  :  PCCM, IR ? ?PUD Prophylaxis : PPI ? ? Procedures  :    ? ?CT-PA 3/24 >> saddle pulmonary embolism with RV/LV ratio 1.35 ?CT abdomen pelvis 3/24 >> no abnormality ?Lower extremity Doppler ultrasound 3/24 >> right popliteal DVT, left clear ?Bilateral pulmonary angiogram with suction thrombectomy done by IR on 07/23/2018 ? ?TTE -  1. Left ventricular ejection fraction, by estimation, is 35 to 40%. Left ventricular ejection fraction by 2D MOD biplane is 35.4 %. The left ventricle has moderately decreased function. The left ventricle demonstrates  regional wall motion abnormalities (see scoring diagram/findings for description). Left ventricular diastolic parameters are consistent with Grade I diastolic dysfunction (impaired relaxation). There is severe hypokinesis of the left ventricular, entire inferoseptal wall, inferior wall, apical segment and inferolateral wall.  2. Poorly visualized RV, however, based on TAPSE and RsVel, the right ventricular systolic function is normal.  3. The mitral valve is abnormal. Trivial mitral valve regurgitation.  4. The aortic valve is tricuspid. Aortic valve regurgitation is mild. Aortic valve sclerosis is present, with no evidence of aortic valve stenosis.  5. Aortic dilatation noted. There is mild dilatation of the ascending aorta, measuring 41 mm.  6. The inferior vena cava is normal in size with greater than 50% respiratory variability, suggesting right atrial pressure of 3 mmHg ? ?   ? ?Disposition Plan  :   ? ?Status is: Inpatient ? ? ?DVT Prophylaxis  : Heparin drip >> Eliquis ?  ? ?Lab Results  ?Component Value Date  ? PLT 165 07/26/2021  ? ? ?Diet :  ?Diet Order   ? ?       ?  Diet heart healthy/carb modified Room service appropriate? Yes; Fluid consistency: Thin  Diet effective now       ?  ? ?  ?  ? ?  ?  ? ?Inpatient Medications ? ?Scheduled Meds: ? apixaban  10 mg Oral BID  ? Followed by  ? [START ON 08/02/2021] apixaban  5 mg Oral BID  ? aspirin  81 mg Oral Daily  ? atorvastatin  80 mg Oral QHS  ? baclofen  20 mg Oral BID  ? carvedilol  6.25 mg Oral BID WC  ? Chlorhexidine Gluconate Cloth  6 each Topical Daily  ? clonazepam  0.5 mg Oral Daily  ? hydrALAZINE  25 mg Oral Q8H  ? insulin aspart  0-15 Units Subcutaneous TID WC  ? insulin aspart  0-5 Units Subcutaneous QHS  ? insulin aspart  3 Units Subcutaneous TID WC  ? insulin glargine-yfgn  32 Units Subcutaneous BID  ? isosorbide mononitrate  30 mg Oral Daily  ? levothyroxine  88 mcg Oral Daily  ? mouth rinse  15 mL Mouth Rinse BID  ? pantoprazole  40 mg Oral  Daily  ? predniSONE  15 mg Oral Q breakfast  ? QUEtiapine  25 mg Oral Q0600  ? sertraline  100 mg Oral Daily  ? sodium chloride flush  3 mL Intravenous Q12H  ? ?Continuous Infusions: ? sodium chloride    ? ?PRN Meds:.acetaminophen **OR** acetaminophen, haloperidol lactate, lip balm, polyvinyl alcohol ? ?Antibiotics  :   ? ?Anti-infectives (From admission, onward)  ? ? None  ? ?  ? ? ? Time Spent in minutes  30 ? ? ?Lala Lund M.D on 07/26/2021 at 8:39 AM ? ?To page go to [email or web address]  ? ?[facility] -  Office  [phone] ? ?See all Orders from today for further details ? ? ? Objective:  ? ?Vitals:  ? 07/25/21 1622 07/25/21 1940 07/26/21 0017 07/26/21 0535  ?BP: 120/71 121/66 121/61 129/88  ?Pulse: 97 89 95 93  ?Resp: '18 19 18 15  '$ ?Temp: 98 ?F (36.7 ?C) 98.9 ?F (37.2 ?C) 98.7 ?F (37.1 ?C) 98.4 ?F (36.9 ?C)  ?TempSrc: Oral Oral Oral Oral  ?SpO2: 94% 93% 100% 100%  ?Weight:      ?Height:      ? ? ?Wt Readings from Last 3 Encounters:  ?07/25/21 90.5 kg  ?02/22/20 90.1 kg  ?01/13/20 95.3 kg  ? ? ? ?Intake/Output Summary (Last 24 hours) at 07/26/2021 0839 ?Last data filed at 07/26/2021 0200 ?Gross per 24 hour  ?Intake 7.74 ml  ?Output 1525 ml  ?Net -1517.26 ml  ? ? ? ?Physical Exam ? ?Awake Alert, O x 4, No new F.N deficits,   ?[place].AT,PERRAL ?Supple Neck, No JVD,   ?Symmetrical Chest wall movement, Good air movement bilaterally, CTAB ?RRR,No Gallops, Rubs or new Murmurs,  ?+ve B.Sounds, Abd Soft, No tenderness,   ?No Cyanosis, right lower extremity weakness chronic, 1+ edema in right lower extremity, left groin hematoma site is stable ?  ? ? Data Review:  ? ? ?CBC ?Recent Labs  ?Lab 07/21/21 ?2032 07/22/21 ?0253 07/23/21 ?0118 07/23/21 ?4742 07/24/21 ?0022 07/25/21 ?5956 07/26/21 ?0344  ?WBC 8.8 8.0  --  8.4 8.0 7.5 6.8  ?HGB 13.7 13.3 13.9 12.7* 13.3 11.6* 11.4*  ?HCT 41.0 41.2 41.0 37.8* 40.1 35.2* 34.2*  ?PLT 130* 129*  --  120* 115* 142* 165  ?MCV 94.5 98.6  --  94.0 96.2 94.1 94.2  ?MCH 31.6 31.8  --  31.6  31.9 31.0 31.4  ?MCHC 33.4 32.3  --  33.6 33.2 33.0 33.3  ?RDW 14.1 14.3  --  14.0 14.1 13.5 13.3  ?LYMPHSABS 0.6*  --   --   --   --   --  1.2  ?MONOABS 0.8  --   --   --   --   --  0.6  ?EOSABS 0.0  --   --

## 2021-07-26 NOTE — Progress Notes (Signed)
Inpatient Diabetes Program Recommendations ? ?AACE/ADA: New Consensus Statement on Inpatient Glycemic Control (2015) ? ?Target Ranges:  Prepandial:   less than 140 mg/dL ?     Peak postprandial:   less than 180 mg/dL (1-2 hours) ?     Critically ill patients:  140 - 180 mg/dL  ? ?Lab Results  ?Component Value Date  ? GLUCAP 123 (H) 07/26/2021  ? HGBA1C 7.3 (H) 07/25/2021  ? ? ?Review of Glycemic Control ? Latest Reference Range & Units 07/25/21 16:24 07/25/21 20:20 07/25/21 22:02 07/26/21 01:07 07/26/21 05:52 07/26/21 08:36 07/26/21 11:45  ?Glucose-Capillary 70 - 99 mg/dL 228 (H) 296 (H) 317 (H) 241 (H) 147 (H) 260 (H) 123 (H)  ? ?Diabetes history: DM 2 ?Outpatient Diabetes medications:  ?Current orders for Inpatient glycemic control:  ?Novolog moderate tid with meals and HS ?Novolog 3 units tid with meals  ?Semglee 32 units bid ? ?Inpatient Diabetes Program Recommendations:   ? ?Spoke with patient and wife regarding DM management.  A1C is actually improved from last check.  Patient does admit that he has lows at times.  He has used CGM in the past but states that the [place] would not cover it and it was going to cost 200$ a month.  See's Peri Jefferson, [place]- at Endocrinology practice with last visit being 06/19/21.  Encouraged him to f/u with PA after hospitalization.  Also may be able to get coverage for CGM from [place]?? We discussed the importance of avoiding low blood sugars.  Wife very supportive and helpful with is care.   ?Can give patient sample CGM prior to discharge if okay with MD? ?Recommend reduction of Basaglar at discharge as well.  ? ?Thanks,  ?Adah Perl, RN, BC-ADM ?Inpatient Diabetes Coordinator ?Pager [phone]  (8a-5p) ? ? ?

## 2021-07-26 NOTE — Progress Notes (Signed)
Inpatient Rehabilitation Admissions Coordinator  ? ?I met with patient's 's wife at bedside as patient asleep in chair. She confirms the plan is for discharge home tomorrow. We will sign off. ? ? , RN, MSN ?Rehab Admissions Coordinator ?[phone] ?07/26/2021 12:42 PM ? ?

## 2021-07-26 NOTE — TOC Initial Note (Addendum)
Transition of Care (TOC) - Initial/Assessment Note  ? ? ?Patient Details  ?Name: [name] ?MRN: [id number] ?Date of Birth: 1951-04-18 ? ?Transition of Care (TOC) CM/SW Contact:    ?Cyndi Bender, RN ?Phone Number: ?07/26/2021, 1:16 PM ? ?Clinical Narrative:                ?Spoke to patient's wife on the phone, Joelene Millin regarding transition needs.   ? ?Orders for Home Health and DME. ? ?Choice offered an patient defers to [facility] to find [facility] agency.  ?Spoke to [place] with [facility] and referral accepted.  ? ?Patient is agreeable to use in house provider, [facility], for DME needs.  ? ?Spoke to [place] with [facility] and 3&1 will be delivered to room prior to discharge. ? ?MD aware to send discharge meds to [facility] for new elquis prescription  ? ?Address, phone number and PCP confirmed.  ? ?[place] with [facility] called back and Notified this RNCM that medicare has new requirements for covering 3&1 and this patient doesn't meet those requirements. Spoke to wife and she is agreeable to order 3&1 from [place]. Emailed DME form to [facility], [place].  ? ? ?Expected Discharge Plan: [place] ?Barriers to Discharge: Continued Medical Work up ? ? ?Patient Goals and CMS Choice ?Patient states their goals for this hospitalization and ongoing recovery are:: return home ?CMS Medicare.gov Compare Post Acute Care list provided to:: Patient ?Choice offered to / list presented to : Patient ? ?Expected Discharge Plan and Services ?Expected Discharge Plan: [place] ?  ?Discharge Planning Services: CM Consult ?Post Acute Care Choice: Durable Medical Equipment, Home Health ?Living arrangements for the past 2 months: [place] ?                ?DME Arranged: 3-N-1 ?DME Agency: [facility] ?Date DME Agency Contacted: 07/26/21 ?Time DME Agency Contacted: [phone] ?Representative spoke with at DME Agency: Delana Meyer ?HH Arranged: OT, PT ?[place] Agency: [place] ?Date HH Agency Contacted: 07/26/21 ?Time [place]: [phone] ?Representative spoke with at [place]: Tommi Rumps ? ?Prior Living Arrangements/Services ?Living arrangements for the past 2 months: [place] ?  ?Patient language and need for interpreter reviewed:: Yes ?Do you feel safe going back to the place where you live?: Yes      ?Need for Family Participation in Patient Care: Yes (Comment) ?Care giver support system in place?: Yes (comment) ?Current home services: DME ?Criminal Activity/Legal Involvement Pertinent to Current Situation/Hospitalization: No - Comment as needed ? ?Activities of Daily Living ?Home Assistive Devices/Equipment: None ?ADL Screening (condition at time of admission) ?Patient's cognitive ability adequate to safely complete daily activities?: Yes ?Is the patient deaf or have difficulty hearing?: No ?Does the patient have difficulty seeing, even when wearing glasses/contacts?: No ?Does the patient have difficulty concentrating, remembering, or making decisions?: No ?Patient able to express need for assistance with ADLs?: Yes ?Does the patient have difficulty dressing or bathing?: No ?Independently performs ADLs?: Yes (appropriate for developmental age) ?Does the patient have difficulty walking or climbing stairs?: Yes ?Weakness of Legs: Both ?Weakness of Arms/Hands: Both ? ?Permission Sought/Granted ?Permission sought to share information with : Case Manager ?Permission granted to share information with : Yes, Verbal Permission Granted ?   ? Permission granted to share info w AGENCY: HH ?   ?   ? ?Emotional Assessment ?  ?  ?  ?Orientation: : Oriented to Situation, Oriented to  Time, Oriented  to Place, Oriented to Self ?Alcohol / Substance Use: Not Applicable ?Psych Involvement: No (comment) ? ?Admission diagnosis:  Acute pulmonary embolism ([place]) [I26.99] ?Acute respiratory failure with hypoxia ([place]) [J96.01] ?Acute saddle pulmonary embolism, unspecified whether acute cor pulmonale present ([place]) [I26.92] ?Patient Active  Problem List  ? Diagnosis Date Noted  ? Non-ST elevation (NSTEMI) myocardial infarction [facility])   ? Acute combined systolic and diastolic heart failure ([place])   ? Acute pulmonary embolism ([place]) 07/22/2021  ? Acute DVT (deep venous thrombosis) ([place]) 07/22/2021  ? Acute kidney injury superimposed on CKD stage3a  07/22/2021  ? Lactic acidosis 07/22/2021  ? Elevated troponin 07/22/2021  ? Acute respiratory failure with hypoxia ([place]) 07/22/2021  ? Anxiety 07/22/2021  ? Ankle swelling 01/17/2020  ? Chronic fatigue 12/12/2018  ? GERD (gastroesophageal reflux disease) 06/04/2018  ? Hypogonadism in male 04/01/2018  ? Anemia 09/30/2017  ? Itching 09/30/2017  ? Hypothyroidism 09/30/2017  ? Low testosterone 09/30/2017  ? Benign prostatic hyperplasia without lower urinary tract symptoms 09/30/2017  ? Erectile dysfunction 03/19/2016  ? Benign essential HTN 08/08/2015  ? Branch retinal vein occlusion with macular edema of left eye 03/25/2015  ? Nuclear sclerotic cataract of both eyes 03/25/2015  ? Myositis 09/28/2014  ? DM2 (diabetes mellitus, type 2) ([place]) 09/28/2014  ? HTN (hypertension) 09/28/2014  ? Abnormal glucose 10/04/2012  ? ?PCP:  [name], DO ?Pharmacy:   ?[id number] [place] [facility] [address]?Phone: [phone]: [phone]? ?[id number] [place] - [id number]: [phone]: [phone] ? ? ? ?Social Determinants of Health (SDOH) Interventions ?  ? ?Readmission Risk Interventions ? ?  07/26/2021  ?  1:16 PM  ?Readmission Risk Prevention Plan  ?Transportation Screening Complete  ?PCP or Specialist Appt within 5-7 Days Complete  ?Home Care Screening Complete  ?Medication Review (RN CM) Complete  ? ? ? ?

## 2021-07-26 NOTE — Care Management Important Message (Signed)
Important Message ? ?Patient Details  ?Name: [name] ?MRN: [id number] ?Date of Birth: January 30, 1951 ? ? ?Medicare Important Message Given:  Yes ? ? ? ? ?Lona Six ?07/26/2021, 12:43 PM ?

## 2021-07-26 NOTE — Progress Notes (Signed)
? ?Progress Note ? ?Patient Name: [name] ?Date of Encounter: 07/26/2021 ? ?[facility] Cardiologist: None  ? ?Subjective  ? ?Denies chest pain or dyspnea.   ? ?Inpatient Medications  ?  ?Scheduled Meds: ? apixaban  10 mg Oral BID  ? Followed by  ? [START ON 08/02/2021] apixaban  5 mg Oral BID  ? aspirin  81 mg Oral Daily  ? atorvastatin  80 mg Oral QHS  ? baclofen  20 mg Oral BID  ? carvedilol  6.25 mg Oral BID WC  ? Chlorhexidine Gluconate Cloth  6 each Topical Daily  ? clonazepam  0.5 mg Oral Daily  ? hydrALAZINE  25 mg Oral Q8H  ? insulin aspart  0-15 Units Subcutaneous TID WC  ? insulin aspart  0-5 Units Subcutaneous QHS  ? insulin aspart  3 Units Subcutaneous TID WC  ? insulin glargine-yfgn  32 Units Subcutaneous BID  ? isosorbide mononitrate  30 mg Oral Daily  ? levothyroxine  88 mcg Oral Daily  ? loratadine  10 mg Oral Daily  ? mouth rinse  15 mL Mouth Rinse BID  ? pantoprazole  40 mg Oral Daily  ? predniSONE  15 mg Oral Q breakfast  ? QUEtiapine  25 mg Oral Q0600  ? sertraline  100 mg Oral Daily  ? sodium chloride flush  3 mL Intravenous Q12H  ? ?Continuous Infusions: ? sodium chloride    ? ?PRN Meds: ?acetaminophen **OR** acetaminophen, haloperidol lactate, lip balm, polyvinyl alcohol  ? ?Vital Signs  ?  ?Vitals:  ? 07/26/21 0017 07/26/21 0535 07/26/21 2778 07/26/21 1147  ?BP: 121/61 129/88 121/67 102/72  ?Pulse: 95 93 97 93  ?Resp: '18 15 20 20  '$ ?Temp: 98.7 ?F (37.1 ?C) 98.4 ?F (36.9 ?C) 99.2 ?F (37.3 ?C) 98.9 ?F (37.2 ?C)  ?TempSrc: Oral Oral Oral Oral  ?SpO2: 100% 100% 95% 99%  ?Weight:      ?Height:      ? ? ?Intake/Output Summary (Last 24 hours) at 07/26/2021 1440 ?Last data filed at 07/26/2021 1147 ?Gross per 24 hour  ?Intake --  ?Output 1175 ml  ?Net -1175 ml  ? ? ? ?  07/25/2021  ?  4:00 AM 07/24/2021  ?  6:00 AM 07/21/2021  ?  7:37 PM  ?Last 3 Weights  ?Weight (lbs) 199 lb 8.3 oz 201 lb 1 oz 208 lb 15.9 oz  ?Weight (kg) 90.5 kg 91.2 kg 94.8 kg  ?   ? ?Telemetry  ?  ?Sinus 70-100s - Personally  Reviewed ? ?ECG  ?  ?No new ECG - Personally Reviewed ? ?Physical Exam  ? ?GEN: No acute distress.   ?Neck: No JVD ?Cardiac: RRR, no murmurs ?Respiratory: Clear to auscultation bilaterally. ?GI: Soft, nontender ?MS: No edema; No deformity. ?Neuro:  Nonfocal  ?Psych: Normal affect  ? ?Labs  ?  ?High Sensitivity Troponin:   ?Recent Labs  ?Lab 07/21/21 ?1749 07/21/21 ?2010 07/22/21 ?0253  ?TROPONINIHS 852* 1,507* 1,450*  ? ?   ?Chemistry ?Recent Labs  ?Lab 07/22/21 ?0253 07/23/21 ?0118 07/24/21 ?0022 07/25/21 ?2423 07/26/21 ?0344  ?NA 140   < > 141 138 142  ?K 5.1   < > 3.4* 4.0 3.8  ?CL 107   < > 111 106 107  ?CO2 26   < > '24 23 25  '$ ?GLUCOSE 131*   < > 71 294* 181*  ?BUN 13   < > '10 19 17  '$ ?CREATININE 1.35*   < > 1.15 1.37* 1.46*  ?CALCIUM  9.8   < > 9.2 8.7* 8.8*  ?MG  --   --   --   --  1.9  ?PROT 5.3*  --  5.6*  --  5.2*  ?ALBUMIN 3.1*  --  3.2*  --  2.9*  ?AST 32  --  41  --  32  ?ALT 25  --  30  --  37  ?ALKPHOS 45  --  47  --  47  ?BILITOT 0.8  --  1.0  --  0.9  ?GFRNONAA 56*   < > >60 55* 51*  ?ANIONGAP 7   < > '6 9 10  '$ ? < > = values in this interval not displayed.  ? ?  ?Lipids  ?Recent Labs  ?Lab 07/24/21 ?0022  ?CHOL 182  ?TRIG 132  ?HDL 41  ?LDLCALC 115*  ?CHOLHDL 4.4  ? ?  ?Hematology ?Recent Labs  ?Lab 07/24/21 ?0022 07/25/21 ?6144 07/26/21 ?3154  ?WBC 8.0 7.5 6.8  ?RBC 4.17* 3.74* 3.63*  ?HGB 13.3 11.6* 11.4*  ?HCT 40.1 35.2* 34.2*  ?MCV 96.2 94.1 94.2  ?MCH 31.9 31.0 31.4  ?MCHC 33.2 33.0 33.3  ?RDW 14.1 13.5 13.3  ?PLT 115* 142* 165  ? ? ?Thyroid No results for input(s): TSH, FREET4 in the last 168 hours.  ?BNP ?Recent Labs  ?Lab 07/21/21 ?1749 07/24/21 ?0022 07/26/21 ?0086  ?BNP 64.8 119.6* 51.2  ? ?  ?DDimer No results for input(s): DDIMER in the last 168 hours.  ? ?Radiology  ?  ?CT HEAD WO CONTRAST (5MM) ? ?Result Date: 07/25/2021 ?CLINICAL DATA:  Seizure disorder.  Fell from bed. EXAM: CT HEAD WITHOUT CONTRAST TECHNIQUE: Contiguous axial images were obtained from the base of the skull through the  vertex without intravenous contrast. RADIATION DOSE REDUCTION: This exam was performed according to the departmental dose-optimization program which includes automated exposure control, adjustment of the mA and/or kV according to patient size and/or use of iterative reconstruction technique. COMPARISON:  07/21/2021 FINDINGS: Brain: Generalized atrophy. Mild ventricular enlargement, stable. This is likely due to atrophy. Mild hypodensity in the cerebral white matter bilaterally unchanged and appears chronic. Negative for acute infarct, hemorrhage, mass. Vascular: Negative for hyperdense vessel Skull: Negative Sinuses/Orbits: Mucosal edema throughout the paranasal sinuses. Negative orbit Other: None IMPRESSION: No acute abnormality no change from the recent study. Mild atrophy and mild chronic microvascular ischemic change in the white matter. Electronically Signed   By: Franchot Gallo M.D.   On: 07/25/2021 10:50   ? ?Cardiac Studies  ? ?Echo 07/23/21: ? 1. Left ventricular ejection fraction, by estimation, is 35 to 40%. Left  ?ventricular ejection fraction by 2D MOD biplane is 35.4 %. The left  ?ventricle has moderately decreased function. The left ventricle  ?demonstrates regional wall motion abnormalities  ?(see scoring diagram/findings for description). Left ventricular diastolic  ?parameters are consistent with Grade I diastolic dysfunction (impaired  ?relaxation). There is severe hypokinesis of the left ventricular, entire  ?inferoseptal wall, inferior wall,  ?apical segment and inferolateral wall.  ? 2. Poorly visualized RV, however, based on TAPSE and RsVel, the right  ?ventricular systolic function is normal.  ? 3. The mitral valve is abnormal. Trivial mitral valve regurgitation.  ? 4. The aortic valve is tricuspid. Aortic valve regurgitation is mild.  ?Aortic valve sclerosis is present, with no evidence of aortic valve  ?stenosis.  ? 5. Aortic dilatation noted. There is mild dilatation of the ascending   ?aorta, measuring 41 mm.  ? 6. The inferior vena  cava is normal in size with greater than 50%  ?respiratory variability, suggesting right atrial pressure of 3 mmHg.  ? ?Patient Profile  ?   ?71 y.o. male with history of diabetes, hypertension, autoimmune myositis, PTSD who presented with saddle PE and we are consulted for evaluation of systolic heart failure. ? ?Assessment & Plan  ?  ?Acute pulmonary embolism/Acute DVT: found to have acute saddle pulmonary embolus with right heart strain and a right popliteal vein DVT.  He has been anticoagulated on heparin for this and underwent thrombectomy on 3/25.  He is improving.  On room air. Right heart function poorly visualized but grossly normal on echo.   ?-Continue Eliquis ? ?NSTEMI: Elevated troponin suspect due to acute PE as above but cannot rule out ACS in the setting of new acute systolic congestive heart failure with LVEF 35 to 40% and regional wall motion abnormalities worse in the RCA territory. Started aspirin and statin therapy.   ?-Will need right and left heart catheterization at some point, would hold off for now given his acute PE/DVT as would not want to hold anticoagulation.  He denies any chest pain.  Will plan outpatient evaluation for cath once he is further removed from his acute thromboembolic event ?-Continue aspirin, statin ? ?Acute combined systolic and diastolic congestive heart failure: Noted to have acute systolic congestive heart failure with LVEF 35 to 40%.   ?-Consider LHC/RHC as outpatient as above ?-Does not appear volume overloaded ?-Started GDMT with carvedilol 6.25 mg twice daily.  Added Imdur/hydralazine.  Hold off on ACE/ARB/ARNI for now given his renal function ? ?AKI on CKD3a: Creatinine elevated to 1.46 today, will monitor ? ? ? ?For questions or updates, please contact [place] ?Please consult [email or web address] for contact info under  ? ?  ?   ?Signed, ?Donato Heinz, MD  ?07/26/2021, 2:40 PM   ? ?

## 2021-07-26 NOTE — Progress Notes (Addendum)
ANTICOAGULATION CONSULT NOTE ?Pharmacy Consult for Heparin ?Indication: pulmonary embolus s/p thrombectomy ?  ? ?Allergies  ?Allergen Reactions  ? Benazepril Cough  ? Gabapentin Swelling  ? Hydrocodone   ? Shellfish Allergy   ? ? ?Patient Measurements: ?Height: '5\' 8"'$  (172.7 cm) ?Weight: 90.5 kg (199 lb 8.3 oz) ?IBW/kg (Calculated) : 68.4 ?Heparin Dosing Weight: 88 kg ? ?Vital Signs: ?Temp: 98.7 ?F (37.1 ?C) (03/29 0017) ?Temp Source: Oral (03/29 0017) ?BP: 121/61 (03/29 0017) ?Pulse Rate: 95 (03/29 0017) ? ?Labs: ?Recent Labs  ?  07/24/21 ?0022 07/24/21 ?1108 07/25/21 ?0243 07/25/21 ?6222 07/26/21 ?0344  ?HGB 13.3  --  11.6*  --  11.4*  ?HCT 40.1  --  35.2*  --  34.2*  ?PLT 115*  --  142*  --  165  ?HEPARINUNFRC 0.10*   < > 0.43 <0.10* <0.10*  ?CREATININE 1.15  --  1.37*  --  1.46*  ?CKTOTAL 606*  --   --   --   --   ? < > = values in this interval not displayed.  ? ? ? ?Estimated Creatinine Clearance: 49.9 mL/min (A) (by C-G formula based on SCr of 1.46 mg/dL (H)). ? ? ?Assessment: ?71 y.o. male with acute saddle PE with RV strain s/p thrombectomy 3/25. Also with occlusive and non-occlusive RLE DVTs. No anticoagulation prior to admission. Pharmacy consulted for heparin.   ? ? ?3/29 AM update:  ?Heparin level remains low after heparin re-start ?Hgb stable from yesterday ? ?Goal of Therapy:  ?Heparin level 0.3-0.7 units/ml ?Monitor platelets by anticoagulation protocol: Yes ?  ?Plan:  ?Inc heparin to 1650 units/hr ?1300 heparin level ? ?Narda Bonds, PharmD, BCPS ?Clinical Pharmacist ? ?Addendum: ?RN reports IV site infiltrated.  Continue heparin 1450 units/hr Check heparin level in 8 hours. ? ?Phillis Knack, PharmD, BCPS ? ?Addendum: ?Heparin to d/c, starting Eliquis 10 mg BID x 7 days, then 5 mg BID ? ?Phillis Knack, PharmD, BCPS 07/26/2021 5:50 AM  ? ?

## 2021-07-26 NOTE — Significant Event (Signed)
I was notified by patient's nurse at around 9:30 PM on July 26, 2021 that patient's left groin hematoma/ecchymotic area was getting bigger and now it spreading to the left lateral aspect of the hip.  On exam at bedside patient is not in distress.  There is an ecchymotic area spreading from the left inguinal area up to the left lateral aspect of his hip.  Patient also has been having some low back pain for the last 4 to 5 days.  Patient denies any chest pain or shortness of breath.   ? ?Vital signs show blood pressure of 127/75 pulse of 97/min temperature of 99.2 respiration of 18/min.   ?The left groin area has ecchymotic area extending up to his left hip area around the left gluteus.  Has good sensation and patient has good pulses on the dorsalis.  No limitation of function of the extremities. ? ?I reviewed patient's charts notes procedures done labs. ? ?I discussed with Dr. Trula Slade on-call vascular surgeon.  Since the procedure was done by the interventional radiologist Dr. Trula Slade requested getting in touch with the interventional radiology team.  And would be happy to assist if need be.  I discussed with Dr. Daryll Brod on-call interventional radiologist.  At this time Dr. Annamaria Boots advised observation for now and holding Eliquis.  If patient is develops any significant pain or worsening swelling of the lower extremities, will need CAT scan to reevaluate.  Will check stat CBC type and screen.  I informed patient and his wife who is at the bedside about the plan of holding Eliquis for now and closely observe the ecchymotic area and also serial CBCs.  I did discuss the risk of holding the Eliquis and patient having recent pulmonary embolism.  Patient and family is in agreement with holding Eliquis for now.  If remains stable overnight will consider starting heparin in the morning. ? ? ?Gean Birchwood ? ?

## 2021-07-27 ENCOUNTER — Other Ambulatory Visit (HOSPITAL_COMMUNITY): Payer: Self-pay

## 2021-07-27 DIAGNOSIS — N179 Acute kidney failure, unspecified: Secondary | ICD-10-CM | POA: Diagnosis not present

## 2021-07-27 DIAGNOSIS — J9601 Acute respiratory failure with hypoxia: Secondary | ICD-10-CM | POA: Diagnosis not present

## 2021-07-27 DIAGNOSIS — I5041 Acute combined systolic (congestive) and diastolic (congestive) heart failure: Secondary | ICD-10-CM | POA: Diagnosis not present

## 2021-07-27 DIAGNOSIS — I2692 Saddle embolus of pulmonary artery without acute cor pulmonale: Secondary | ICD-10-CM | POA: Diagnosis not present

## 2021-07-27 LAB — BASIC METABOLIC PANEL
Anion gap: 8 (ref 5–15)
BUN: 14 mg/dL (ref 8–23)
CO2: 25 mmol/L (ref 22–32)
Calcium: 8.8 mg/dL — ABNORMAL LOW (ref 8.9–10.3)
Chloride: 109 mmol/L (ref 98–111)
Creatinine, Ser: 1.39 mg/dL — ABNORMAL HIGH (ref 0.61–1.24)
GFR, Estimated: 54 mL/min — ABNORMAL LOW (ref >60)
Glucose, Bld: 205 mg/dL — ABNORMAL HIGH (ref 70–99)
Potassium: 4.3 mmol/L (ref 3.5–5.1)
Sodium: 142 mmol/L (ref 135–145)

## 2021-07-27 LAB — CBC WITH DIFFERENTIAL/PLATELET
Abs Immature Granulocytes: 0.16 10*3/uL — ABNORMAL HIGH (ref 0.00–0.07)
Basophils Absolute: 0 10*3/uL (ref 0.0–0.1)
Basophils Relative: 0 %
Eosinophils Absolute: 0.1 10*3/uL (ref 0.0–0.5)
Eosinophils Relative: 2 %
HCT: 34.3 % — ABNORMAL LOW (ref 39.0–52.0)
Hemoglobin: 11.1 g/dL — ABNORMAL LOW (ref 13.0–17.0)
Immature Granulocytes: 2 %
Lymphocytes Relative: 22 %
Lymphs Abs: 1.6 10*3/uL (ref 0.7–4.0)
MCH: 30.9 pg (ref 26.0–34.0)
MCHC: 32.4 g/dL (ref 30.0–36.0)
MCV: 95.5 fL (ref 80.0–100.0)
Monocytes Absolute: 0.7 10*3/uL (ref 0.1–1.0)
Monocytes Relative: 9 %
Neutro Abs: 4.6 10*3/uL (ref 1.7–7.7)
Neutrophils Relative %: 65 %
Platelets: 179 10*3/uL (ref 150–400)
RBC: 3.59 MIL/uL — ABNORMAL LOW (ref 4.22–5.81)
RDW: 13.3 % (ref 11.5–15.5)
WBC: 7.1 10*3/uL (ref 4.0–10.5)
nRBC: 0.7 % — ABNORMAL HIGH (ref 0.0–0.2)

## 2021-07-27 LAB — TYPE AND SCREEN
ABO/RH(D): A POS
Antibody Screen: NEGATIVE

## 2021-07-27 LAB — CBC
HCT: 33.8 % — ABNORMAL LOW (ref 39.0–52.0)
Hemoglobin: 11.3 g/dL — ABNORMAL LOW (ref 13.0–17.0)
MCH: 31.7 pg (ref 26.0–34.0)
MCHC: 33.4 g/dL (ref 30.0–36.0)
MCV: 94.7 fL (ref 80.0–100.0)
Platelets: 179 10*3/uL (ref 150–400)
RBC: 3.57 MIL/uL — ABNORMAL LOW (ref 4.22–5.81)
RDW: 13.5 % (ref 11.5–15.5)
WBC: 7.3 10*3/uL (ref 4.0–10.5)
nRBC: 0.8 % — ABNORMAL HIGH (ref 0.0–0.2)

## 2021-07-27 LAB — GLUCOSE, CAPILLARY
Glucose-Capillary: 173 mg/dL — ABNORMAL HIGH (ref 70–99)
Glucose-Capillary: 171 mg/dL — ABNORMAL HIGH (ref 70–99)

## 2021-07-27 LAB — ABO/RH: ABO/RH(D): A POS

## 2021-07-27 MED ORDER — CARVEDILOL 6.25 MG PO TABS
6.2500 mg | ORAL_TABLET | Freq: Two times a day (BID) | ORAL | 0 refills | Status: DC
  Filled 2021-07-27: qty 60, 30d supply, fill #0

## 2021-07-27 MED ORDER — APIXABAN 5 MG PO TABS
10.0000 mg | ORAL_TABLET | Freq: Two times a day (BID) | ORAL | 0 refills | Status: DC
Start: 2021-07-27 — End: 2021-08-09
  Filled 2021-07-27: qty 60, 30d supply, fill #0

## 2021-07-27 MED ORDER — APIXABAN 5 MG PO TABS
5.0000 mg | ORAL_TABLET | Freq: Two times a day (BID) | ORAL | 0 refills | Status: DC
  Filled 2021-07-27: qty 60, 30d supply, fill #0

## 2021-07-27 MED ORDER — APIXABAN 5 MG PO TABS: 5.0000 mg | ORAL_TABLET | Freq: Two times a day (BID) | ORAL | Status: DC

## 2021-07-27 MED ORDER — ATORVASTATIN CALCIUM 80 MG PO TABS
80.0000 mg | ORAL_TABLET | Freq: Every day | ORAL | 0 refills | Status: DC
  Filled 2021-07-27: qty 60, 60d supply, fill #0

## 2021-07-27 MED ORDER — QUETIAPINE FUMARATE 25 MG PO TABS
25.0000 mg | ORAL_TABLET | Freq: Every day | ORAL | 0 refills | Status: DC
  Filled 2021-07-27: qty 30, 30d supply, fill #0

## 2021-07-27 MED ORDER — APIXABAN 5 MG PO TABS
10.0000 mg | ORAL_TABLET | Freq: Two times a day (BID) | ORAL | Status: DC
  Administered 2021-07-27: 10 mg via ORAL
  Filled 2021-07-27: qty 2

## 2021-07-27 MED ORDER — HYDRALAZINE HCL 25 MG PO TABS
25.0000 mg | ORAL_TABLET | Freq: Three times a day (TID) | ORAL | 0 refills | Status: DC
  Filled 2021-07-27: qty 90, 30d supply, fill #0

## 2021-07-27 MED ORDER — HYDROXYZINE HCL 25 MG PO TABS
25.0000 mg | ORAL_TABLET | Freq: Once | ORAL | Status: AC
  Administered 2021-07-27: 25 mg via ORAL
  Filled 2021-07-27: qty 1

## 2021-07-27 MED ORDER — ISOSORBIDE MONONITRATE ER 30 MG PO TB24
30.0000 mg | ORAL_TABLET | Freq: Every day | ORAL | 0 refills | Status: DC
  Filled 2021-07-27: qty 30, 30d supply, fill #0

## 2021-07-27 NOTE — Progress Notes (Signed)
Pt has an ecchymosis that extends from his groin area across his left hip 30cm long and 12.3 cm tall.  The area across his left flank is 21.cm long by 4.5 cm tall.  The entire outline of the area has been outlined with a skin marker.  No additional spreading has been seen overnight and no pain, swelling, or loss of sensation has been reported.  ?

## 2021-07-27 NOTE — Progress Notes (Signed)
Occupational Therapy Treatment ?Patient Details ?Name: [name] ?MRN: [id number] ?DOB: [birth year] ?Today's Date: 07/27/2021 ? ? ?History of present illness 71 y.o. male presents to ED 3/24 with complaints of 2 bouts of increased confuseion with low glucose, and R LE pain. In ED , BP in 08-657Q systolic and HR in 469-629B CT PE study showed saddle pulmonary embolus with right heart strain and RV to LV ratio of 1.35 consistent with at least submassive PE. Venous Dopplers showed occlusive right popliteal DVT s/p Bilateral pulmonary angiogram and thrombectomy 07/22/21  PMH: diabetes, hypertension, anemia, hypothyroidism, BPH, GERD, PTSD, autoimmune myositis presenting with weakness, pain, confusion. ?  ?OT comments ? Patient received in bed and patient's wife informs therapist that he is expected to return home today. Patient performed self care standing at sink with min guard for safety while standing and min assist for LB bathing. Patient able to perform UB dressing and required assistance for LB dressing due to difficulty lifting RLE.  Patient is expected to return home and would benefit from further OT therapy with HHOT to increase independence with self care tasks.   ? ?Recommendations for follow up therapy are one component of a multi-disciplinary discharge planning process, led by the attending physician.  Recommendations may be updated based on patient status, additional functional criteria and insurance authorization. ?   ?Follow Up Recommendations ? Home health OT  ?  ?Assistance Recommended at Discharge Intermittent Supervision/Assistance  ?Patient can return home with the following ? A little help with walking and/or transfers;A little help with bathing/dressing/bathroom;Assistance with cooking/housework;Help with stairs or ramp for entrance;Assist for transportation ?  ?Equipment Recommendations ? None recommended by OT  ?  ?Recommendations for Other Services   ? ?  ?Precautions / Restrictions  Precautions ?Precautions: Fall ?Precaution Comments: fall during admission, watch HR ?Restrictions ?Weight Bearing Restrictions: No  ? ? ?  ? ?Mobility Bed Mobility ?Overal bed mobility: Needs Assistance ?Bed Mobility: Supine to Sit ?  ?  ?Supine to sit: Min assist, HOB elevated ?  ?  ?General bed mobility comments: assistance scooting forward with cues for rail use ?  ? ?Transfers ?Overall transfer level: Needs assistance ?Equipment used: Rolling walker (2 wheels) ?Transfers: Sit to/from Stand ?Sit to Stand: Min assist ?  ?  ?  ?  ?  ?General transfer comment: min assist to power up and verbal cues for correct walker use ?  ?  ?Balance Overall balance assessment: Needs assistance ?Sitting-balance support: No upper extremity supported, Feet supported ?Sitting balance-Leahy Scale: Good ?  ?  ?Standing balance support: Bilateral upper extremity supported, No upper extremity supported, During functional activity ?Standing balance-Leahy Scale: Poor ?Standing balance comment: stood at sink for self care tasks, able to use BUE to pull up pants ?  ?  ?  ?  ?  ?  ?  ?  ?  ?  ?  ?   ? ?ADL either performed or assessed with clinical judgement  ? ?ADL Overall ADL's : Needs assistance/impaired ?  ?  ?Grooming: Wash/dry hands;Wash/dry face;Oral care;Min guard;Standing ?Grooming Details (indicate cue type and reason): standing at sink ?Upper Body Bathing: Min guard;Standing ?Upper Body Bathing Details (indicate cue type and reason): at sink ?Lower Body Bathing: Minimal assistance;Sit to/from stand ?Lower Body Bathing Details (indicate cue type and reason): assistance for right foot ?Upper Body Dressing : Supervision/safety;Sitting ?Upper Body Dressing Details (indicate cue type and reason): donned T-shirt ?Lower Body Dressing: Moderate assistance;Sitting/lateral leans;Sit to/from stand ?Lower Body Dressing Details (indicate  cue type and reason): difficutly threading clothing over RLE ?  ?  ?  ?  ?  ?  ?  ?General ADL Comments:  self care performed at sink with patient being min guard while standing and mod assist for LB dressing due to difficulty with RLE ?  ? ?Extremity/Trunk Assessment   ?  ?  ?  ?  ?  ? ?Vision   ?  ?  ?Perception   ?  ?Praxis   ?  ? ?Cognition Arousal/Alertness: Awake/alert ?Behavior During Therapy: Flat affect ?Overall Cognitive Status: Impaired/Different from baseline ?Area of Impairment: Following commands, Safety/judgement, Awareness, Problem solving, Memory, Attention ?  ?  ?  ?  ?  ?  ?  ?  ?  ?Current Attention Level: Selective ?Memory: Decreased short-term memory ?Following Commands: Follows multi-step commands inconsistently, Follows one step commands consistently, Follows one step commands with increased time, Follows multi-step commands with increased time ?Safety/Judgement: Decreased awareness of deficits, Decreased awareness of safety ?Awareness: Emergent ?Problem Solving: Slow processing, Difficulty sequencing, Requires verbal cues, Requires tactile cues ?General Comments: able to answer orientation questions with increased time. Required verbal cues for safety and correct walker use ?  ?  ?   ?Exercises   ? ?  ?Shoulder Instructions   ? ? ?  ?General Comments    ? ? ?Pertinent Vitals/ Pain       Pain Assessment ?Pain Assessment: Faces ?Faces Pain Scale: Hurts little more ?Pain Location: R LE with movement ?Pain Descriptors / Indicators: Grimacing, Guarding ?Pain Intervention(s): Monitored during session, Repositioned ? ?Home Living   ?  ?  ?  ?  ?  ?  ?  ?  ?  ?  ?  ?  ?  ?  ?  ?  ?  ?  ? ?  ?Prior Functioning/Environment    ?  ?  ?  ?   ? ?Frequency ? Min 2X/week  ? ? ? ? ?  ?Progress Toward Goals ? ?OT Goals(current goals can now be found in the care plan section) ? Progress towards OT goals: Progressing toward goals ? ?Acute Rehab OT Goals ?Patient Stated Goal: go home ?OT Goal Formulation: With patient ?Time For Goal Achievement: 08/08/21 ?Potential to Achieve Goals: Good ?ADL Goals ?Pt Will Perform  Lower Body Bathing: with modified independence;sit to/from stand;with adaptive equipment ?Pt Will Perform Lower Body Dressing: with modified independence;with adaptive equipment;sit to/from stand ?Pt Will Transfer to Toilet: with modified independence;ambulating ?Pt/caregiver will Perform Home Exercise Program: Increased strength;Both right and left upper extremity;With theraband;Independently;With written HEP provided ?Additional ADL Goal #1: Pt to verbalize at least 3 fall prevention strategies to implement during daily tasks  ?Plan Discharge plan remains appropriate   ? ?Co-evaluation ? ? ?   ?  ?  ?  ?  ? ?  ?AM-PAC OT "6 Clicks" Daily Activity     ?Outcome Measure ? ? Help from another person eating meals?: None ?Help from another person taking care of personal grooming?: A Little ?Help from another person toileting, which includes using toliet, bedpan, or urinal?: A Little ?Help from another person bathing (including washing, rinsing, drying)?: A Little ?Help from another person to put on and taking off regular upper body clothing?: A Little ?Help from another person to put on and taking off regular lower body clothing?: A Lot ?6 Click Score: 18 ? ?  ?End of Session Equipment Utilized During Treatment: Rolling walker (2 wheels) ? ?OT Visit Diagnosis: Unsteadiness on feet (  R26.81);Other abnormalities of gait and mobility (R26.89);Muscle weakness (generalized) (M62.81);History of falling (Z91.81) ?  ?Activity Tolerance Patient tolerated treatment well ?  ?Patient Left in chair;with call bell/phone within reach;with family/visitor present ?  ?Nurse Communication Mobility status ?  ? ?   ? ?Time: [phone] ?OT Time Calculation (min): 41 min ? ?Charges: OT General Charges ?$OT Visit: 1 Visit ?OT Treatments ?$Self Care/Home Management : 38-52 mins ? ?Lodema Hong, OTA ?Acute Rehabilitation Services  ?Pager [phone] ?Office [phone] ? ? ?Trixie Dredge ?07/27/2021, 8:48 AM ?

## 2021-07-27 NOTE — TOC Transition Note (Signed)
Transition of Care (TOC) - CM/SW Discharge Note ? ? ?Patient Details  ?Name: [name] ?MRN: [id number] ?Date of Birth: 1950-08-15 ? ?Transition of Care (TOC) CM/SW Contact:  ?Cyndi Bender, RN ?Phone Number: ?07/27/2021, 10:07 AM ? ? ?Clinical Narrative:    ?Patient stable for discharge.  ?Notified Cory with [employer]. ?Discharge [facility] to [facility]. ?[facility] will deliver 3&1 once approved.  ?Wife can transport home. ? ?Final next level of care: [place] ?Barriers to Discharge: Barriers Resolved ? ? ?Patient Goals and CMS Choice ?Patient states their goals for this hospitalization and ongoing recovery are:: return home ?CMS Medicare.gov Compare Post Acute Care list provided to:: Patient ?Choice offered to / list presented to : Patient ? ?Discharge Placement ?  ?           ?  ? home ?  ?  ? ?Discharge Plan and Services ?  ?Discharge Planning Services: CM Consult ?Post Acute Care Choice: Durable Medical Equipment, Home Health          ?DME Arranged: 3-N-1 ?DME Agency: Other - Comment ([place]) ?Date DME Agency Contacted: 07/26/21 ?Time DME Agency Contacted: [phone] ?Representative spoke with at DME Agency: Delana Meyer ?HH Arranged: OT, PT ?[place] Agency: [place] ?Date HH Agency Contacted: 07/26/21 ?Time [place]: [phone] ?Representative spoke with at [place]: Tommi Rumps ? ?Social Determinants of Health (SDOH) Interventions ?  ? ? ?Readmission Risk Interventions ? ?  07/27/2021  ? 10:06 AM 07/26/2021  ?  1:16 PM  ?Readmission Risk Prevention Plan  ?Transportation Screening Complete Complete  ?PCP or Specialist Appt within 5-7 Days  Complete  ?PCP or Specialist Appt within 3-5 Days Complete   ?Home Care Screening  Complete  ?Medication Review (RN CM)  Complete  ?[place] or Home Care Consult Complete   ?Social Work Consult for [place] Planning/Counseling Complete   ?Palliative Care Screening Not Applicable   ?Medication Review Press photographer) Complete   ? ? ? ? ? ?

## 2021-07-27 NOTE — Progress Notes (Signed)
Patient is alert with periods of forgetfulness. Reoriented as needed. Discharge instructions discussed and reviewed with patient and his spouse who is at bedside. Understanding voiced patients spouse wants his medications to be sent to [facility] in [place], message sent to Dr. Candiss Norse ?

## 2021-07-27 NOTE — Discharge Summary (Addendum)
?                                                                                ? ?[name] PRF:[id number] DOB: 12-15-1950 DOA: 07/21/2021 ? ?PCP: [name], DO ? ?Admit date: 07/21/2021  Discharge date: 07/27/2021 ? ?Admitted From: Home   Disposition:  Home ? ? ?Recommendations for Outpatient Follow-up:  ? ?Follow up with PCP in 1-2 weeks ? ?PCP Please obtain BMP/CBC, 2 view CXR in 1week,  (see Discharge instructions)  ? ?PCP Please follow up on the following pending results: Monitor left groin hematoma, check CBC CMP in 2 weeks, adjust blood pressure medications, if renal function has stabilized ARB can be resumed and hydralazine along with long-acting nitroglycerin can be held.  Needs outpatient cardiology follow-up in 1 to 2 weeks. ? ? ?Home Health: PT, OT   ?Equipment/Devices: as below  ?Consultations: IR, PCCM, Cards ?Discharge Condition: Stable    ?CODE STATUS: Full    ?Diet Recommendation: Heart Healthy Low Carb ?  ? ?Chief Complaint  ?Patient presents with  ? Multiple c/o  ?  ? ?Brief history of present illness from the day of admission and additional interim summary   ? ?71 year old gentleman with a history of diabetes, hypothyroidism, hypertension, autoimmune myositis on MTX and Pred, PTSD.  He is on chronic testosterone injection therapy.  He presented 3/24 with some generalized weakness mild shortness of breath work-up showed large saddle PE along with some hypoglycemia.  He was admitted to ICU.  Was transferred to hospitalist service on 07/25/2021. ? ?                                                               Hospital Course  ? ? ?Acute pulmonary embolism (HCC) acute right lower extremity DVT with acute hypoxic respiratory failure due to PE.  He has chronic right lower extremity weakness and has limited mobility at baseline along with that he takes testosterone injections.  He is s/p bilateral pulmonary  angiogram with suction thrombectomy by IR on 07/22/2021 was on heparin drip >> Eliquis will require lifelong anticoagulation and cessation of testosterone shots.  Now on heparin 24 hours and symptom-free on room air will be discharged home , he had lactic acidosis due to hypoperfusion no sepsis. ?  ?  ?NSTEMI with echo showing EF of 40% with wall motion abnormality.  Currently chest pain-free, cardiology team on board, on aspirin, statin, beta-blocker and heparin drip.  Added BiDil for better blood pressure control.  Discussed with cardiology team outpatient follow-up in 1 to 2 weeks PCP to arrange. ?  ?Hypertension.  On beta-blocker dose adjusted, low-dose BiDil added as well will monitor. ?  ?Acute kidney injury superimposed on CKD stage3a   - Baseline creatinine 1.3-1.4, Presented with creatinine gradually getting back to baseline, continuing to hold ARB upon discharge if renal function stays stable PCP can resume ARB and discontinue hydralazine and long-acting nitrate combination. ?  ?Anxiety  - Patient  has a history of PTSD, new home medications nighttime Seroquel added with good effect.   ? ?GERD (gastroesophageal reflux disease)  - Continue PPI ?  ?Hypothyroidism  - Continue Synthroid ?  ?Generalized weakness and deconditioning.  PT OT, fall in the hospital on 07/25/2021, no new aches or pains, no headache, head CT unremarkable.  At baseline.  Refuses SNF or CIR family want to go home with home health. ?  ?DM2 (diabetes mellitus, type 2) ([place]) -  Patient not compliant with diet, for now resume home regimen as A1c was stable.  Requested to check CBGs q. ACH S.  PCP to monitor. ? ?Left groin hematoma site stable with some old blood tracking down not correlating with H&H, H&H stable.  Wife bedside agrees with the plan.  He will be discharged with outpatient PCP follow-up. ? ? ? ? ?Discharge diagnosis   ? ? ?Principal Problem: ?  Acute pulmonary embolism ([place]) ?Active Problems: ?  Acute DVT (deep venous thrombosis)  ([place]) ?  Elevated troponin ?  Acute respiratory failure with hypoxia ([place]) ?  DM2 (diabetes mellitus, type 2) ([place]) ?  Acute kidney injury superimposed on CKD stage3a  ?  HTN (hypertension) ?  Hypothyroidism ?  GERD (gastroesophageal reflux disease) ?  Anxiety ?  Lactic acidosis ?  Non-ST elevation (NSTEMI) myocardial infarction [facility]) ?  Acute combined systolic and diastolic heart failure ([place]) ? ? ? ?Discharge instructions   ? ?Discharge Instructions   ? ? Diet - low sodium heart healthy   Complete by: As directed ?  ? Discharge instructions   Complete by: As directed ?  ? Follow with Primary MD [name], DO in 7 days, stop taking testosterone supplements as they can increase your chances of getting a blood clot. ? ?Get CBC, CMP, 2 view Chest X ray -  checked next visit within 1 week by Primary MD  ? ?Activity: As tolerated with Full fall precautions use walker/cane & assistance as needed ? ?Disposition Home    ? ?Diet: Heart Healthy Low Carb ? ?Accuchecks 4 times/day, Once in AM empty stomach and then before each meal. ?Log in all results and show them to your Prim.MD in 3 days. ?If any glucose reading is under 80 or above 300 call your Prim MD immidiately. ?Follow Low glucose instructions for glucose under 80 as instructed. ? ?Special Instructions: If you have smoked or chewed Tobacco  in the last 2 yrs please stop smoking, stop any regular Alcohol  and or any Recreational drug use. ? ?On your next visit with your primary care physician please Get Medicines reviewed and adjusted. ? ?Please request your Prim.MD to go over all Hospital Tests and Procedure/Radiological results at the follow up, please get all Hospital records sent to your Prim MD by signing hospital release before you go home. ? ?If you experience worsening of your admission symptoms, develop shortness of breath, life threatening emergency, suicidal or homicidal thoughts you must seek medical attention immediately by calling 911 or calling  your MD immediately  if symptoms less severe. ? ?You Must read complete instructions/literature along with all the possible adverse reactions/side effects for all the Medicines you take and that have been prescribed to you. Take any new Medicines after you have completely understood and accpet all the possible adverse reactions/side effects.  ? Discharge wound care:   Complete by: As directed ?  ? Left groin site clean and dry at all times.  ? Increase activity slowly  Complete by: As directed ?  ? ?  ? ? ?Discharge Medications  ? ?Allergies as of 07/27/2021   ? ?   Reactions  ? Benazepril Cough  ? Gabapentin Swelling  ? Hydrocodone   ? Shellfish Allergy   ? ?  ? ?  ?Medication List  ?  ? ?STOP taking these medications   ? ?DULoxetine 30 MG capsule ?Commonly known as: CYMBALTA ?  ?empagliflozin 25 MG Tabs tablet ?Commonly known as: JARDIANCE ?  ?gabapentin 100 MG capsule ?Commonly known as: NEURONTIN ?  ?losartan 50 MG tablet ?Commonly known as: COZAAR ?  ?tadalafil 20 MG tablet ?Commonly known as: CIALIS ?  ?testosterone cypionate 200 MG/ML injection ?Commonly known as: DEPOTESTOSTERONE CYPIONATE ?  ? ?  ? ?TAKE these medications   ? ?acarbose 100 MG tablet ?Commonly known as: PRECOSE ?Take 100 mg by mouth 3 (three) times daily with meals. ?  ?apixaban 5 MG Tabs tablet ?Commonly known as: ELIQUIS ?Take 2 tablets (10 mg total) by mouth 2 (two) times daily for 5 days. Then take 1 tablet by mouth twice daily. ?  ?apixaban 5 MG Tabs tablet ?Commonly known as: ELIQUIS ?Take 1 tablet (5 mg total) by mouth 2 (two) times daily. ?Start taking on: August 02, 2021 ?  ?atorvastatin 80 MG tablet ?Commonly known as: LIPITOR ?Take 1 tablet (80 mg total) by mouth at bedtime. ?  ?baclofen 20 MG tablet ?Commonly known as: LIORESAL ?Take 20 mg by mouth 2 (two) times daily. ?  ?calcium carbonate 600 MG Tabs tablet ?Commonly known as: OS-CAL ?Take 600 mg by mouth 2 (two) times daily with a meal. ?  ?carvedilol 6.25 MG tablet ?Commonly  known as: COREG ?Take 1 tablet (6.25 mg total) by mouth 2 (two) times daily with a meal. ?  ?folic acid 1 MG tablet ?Commonly known as: FOLVITE ?Take 1 mg by mouth daily. ?  ?furosemide 20 MG tablet ?Common

## 2021-07-28 ENCOUNTER — Telehealth: Payer: Self-pay | Admitting: General Practice

## 2021-07-28 NOTE — Telephone Encounter (Signed)
Transition Care Management Follow-up Telephone Call ?Date of discharge and from where: 07/27/21 from [facility] ?How have you been since you were released from the hospital? He was seen for altered mental status, PE, MI and hypoglycemia. His wife states he is doing a little better. They have been assigned home healthcare and they will be in touch with them. He is going to follow up with cardiologist in one a week and then with Dr. Zigmund Daniel after that. ?Any questions or concerns? No ? ?Items Reviewed: ?Did the pt receive and understand the discharge instructions provided? Yes  ?Medications obtained and verified? No  ?Other? No  ?Any new allergies since your discharge? No  ?Dietary orders reviewed? Yes ?Do you have support at home? Yes  ? ?Home Care and Equipment/Supplies: ?Were home health services ordered? yes ?If so, what is the name of the agency? [facility]  ?Has the agency set up a time to come to the patient's home? no ?Were any new equipment or medical supplies ordered?  Yes: Bedside commode ?Were you able to get the supplies/equipment? It will be delivered today. ?Do you have any questions related to the use of the equipment or supplies? No ? ?Functional Questionnaire: (I = Independent and D = Dependent) ?ADLs: D ? ?Bathing/Dressing- D ? ?Meal Prep- D ? ?Eating- I ? ?Maintaining continence- I ? ?Transferring/Ambulation- D ? ?Managing Meds- I ? ?Follow up appointments reviewed: ? ?PCP Hospital f/u appt confirmed? Yes  Scheduled to see Dr. Zigmund Daniel on 08/09/21 @ 1130. ?[facility] f/u appt confirmed? No  His wife will call his cardiologist to set up the appointment. ?Are transportation arrangements needed? No  ?If their condition worsens, is the pt aware to call PCP or go to the Emergency Dept.? Yes ?Was the patient provided with contact information for the PCP's office or ED? Yes ?Was to pt encouraged to call back with questions or concerns? Yes  ?

## 2021-07-29 LAB — CULTURE, BLOOD (ROUTINE X 2)
Culture: NO GROWTH
Culture: NO GROWTH
Special Requests: ADEQUATE
Special Requests: ADEQUATE

## 2021-08-04 ENCOUNTER — Telehealth (HOSPITAL_COMMUNITY): Payer: Self-pay

## 2021-08-04 NOTE — Telephone Encounter (Signed)
Transitions of Care Pharmacy  ° °Call attempted for a pharmacy transitions of care follow-up. HIPAA appropriate voicemail was left with call back information provided.  ° °Call attempt #1. Will follow-up in 2-3 days.  °  °

## 2021-08-08 ENCOUNTER — Telehealth (HOSPITAL_COMMUNITY): Payer: Self-pay

## 2021-08-08 NOTE — Telephone Encounter (Signed)
Transitions of Care Pharmacy   Call attempted for a pharmacy transitions of care follow-up. HIPAA appropriate voicemail was left with call back information provided.   Call attempt #2. Will follow-up in 2-3 days.    

## 2021-08-09 ENCOUNTER — Ambulatory Visit (INDEPENDENT_AMBULATORY_CARE_PROVIDER_SITE_OTHER): Payer: Medicare Other | Admitting: Family Medicine

## 2021-08-09 ENCOUNTER — Telehealth (HOSPITAL_COMMUNITY): Payer: Self-pay

## 2021-08-09 ENCOUNTER — Encounter: Payer: Self-pay | Admitting: Family Medicine

## 2021-08-09 VITALS — BP 119/65 | HR 109 | Ht 68.0 in | Wt 194.0 lb

## 2021-08-09 DIAGNOSIS — R413 Other amnesia: Secondary | ICD-10-CM | POA: Diagnosis not present

## 2021-08-09 DIAGNOSIS — E1165 Type 2 diabetes mellitus with hyperglycemia: Secondary | ICD-10-CM

## 2021-08-09 DIAGNOSIS — N179 Acute kidney failure, unspecified: Secondary | ICD-10-CM | POA: Diagnosis not present

## 2021-08-09 DIAGNOSIS — R519 Headache, unspecified: Secondary | ICD-10-CM

## 2021-08-09 DIAGNOSIS — I10 Essential (primary) hypertension: Secondary | ICD-10-CM

## 2021-08-09 DIAGNOSIS — R7989 Other specified abnormal findings of blood chemistry: Secondary | ICD-10-CM

## 2021-08-09 DIAGNOSIS — I2602 Saddle embolus of pulmonary artery with acute cor pulmonale: Secondary | ICD-10-CM

## 2021-08-09 DIAGNOSIS — I214 Non-ST elevation (NSTEMI) myocardial infarction: Secondary | ICD-10-CM | POA: Diagnosis not present

## 2021-08-09 DIAGNOSIS — M069 Rheumatoid arthritis, unspecified: Secondary | ICD-10-CM | POA: Insufficient documentation

## 2021-08-09 DIAGNOSIS — J9601 Acute respiratory failure with hypoxia: Secondary | ICD-10-CM

## 2021-08-09 DIAGNOSIS — N189 Chronic kidney disease, unspecified: Secondary | ICD-10-CM

## 2021-08-09 DIAGNOSIS — Z794 Long term (current) use of insulin: Secondary | ICD-10-CM

## 2021-08-09 HISTORY — DX: Rheumatoid arthritis, unspecified: M06.9

## 2021-08-09 MED ORDER — DIAZEPAM 10 MG PO TABS: 10.0000 mg | ORAL_TABLET | Freq: Once | ORAL | 0 refills | Status: AC

## 2021-08-09 NOTE — Assessment & Plan Note (Signed)
Slightly reduced ejection fraction noted on echocardiogram.  He does have follow-up with cardiology. ?

## 2021-08-09 NOTE — Assessment & Plan Note (Signed)
Rechecking renal function. 

## 2021-08-09 NOTE — Assessment & Plan Note (Addendum)
He is having new onset headaches as well as some memory changes.  MRI brain ordered for further evaluation.  We will give diazepam 10 mg 1 hour prior to the MRI. ?

## 2021-08-09 NOTE — Telephone Encounter (Signed)
Transitions of Care Pharmacy   Call attempted for a pharmacy transitions of care follow-up. HIPAA appropriate voicemail was left with call back information provided.   Call attempt #3. Will no longer attempt follow up for TOC pharmacy.   

## 2021-08-09 NOTE — Assessment & Plan Note (Signed)
Blood pressure is stable at this time.  However would preferably like to get him off of BiDil if he is having increased lower extremity swelling he would prefer to limit the number of medications he is needed taking today.  If renal function has not improved we discussed restarting losartan. ?

## 2021-08-09 NOTE — Assessment & Plan Note (Signed)
He will need to remain off of testosterone ?

## 2021-08-09 NOTE — Assessment & Plan Note (Signed)
Status post thrombectomy.  He will remain on anticoagulation with Eliquis.  He will need lifelong anticoagulation and will need to remain off testosterone injections. ?

## 2021-08-09 NOTE — Assessment & Plan Note (Signed)
He did have some hypoglycemic episodes in the hospital.  Appetite still has not fully returned.  We will continue his home regimen however we discussed working on dietary compliance.  He will continue to see endocrinology for management. ?

## 2021-08-09 NOTE — Assessment & Plan Note (Signed)
No oxygen requirement at this time.  Secondary to his PE. ?

## 2021-08-09 NOTE — Patient Instructions (Addendum)
Tramadol taper:  take 2 tabs in the morning, 1 tab in the afternoon and 2 tabs at bedtime for 5 days.  Decrease to 1.5 tabs in the morning 0.5 tab in the afternoon and 1.5 tabs at bedtime for 5 days.  ?Decrease to 1 tab in the morning and 0.5 tab in the afternoon and 1 tab at bedtime for 7 days.  ?Decrease to 1 tab in the morning and 1 tab at bedtime for 7 days.  ? ?If kidney function looks ok we'll restart losartan and stop hydralazine and isosorbide ? ?Take diazepam 1 hour prior to MRI.  You will need someone to drive you to and from appt.  ?

## 2021-08-09 NOTE — Progress Notes (Signed)
?[name] - 71 y.o. male MRN [id number]  Date of birth: Feb 05, 1951 ? ?Subjective ?Chief Complaint  ?Patient presents with  ? Hospitalization Follow-up  ? ? ?HPI ?[name] is a 71 year old male here today for hospital follow-up.  He was admitted from 07/21/2021 to 07/27/2021.  TOC call completed on 07/28/2021.  Initial presentation was with altered mental status and generalized weakness.  Additionally he had shortness of breath, CT angio of the chest showed saddle pulmonary embolus with evidence of right heart strain.  Ultrasound of the right lower extremity did not show DVT as well.  He did undergo thrombectomy of PE by interventional radiology.  His testosterone injections were discontinued as it was felt that these may have contributed to his thromboembolic event.  He was discharged on Eliquis which he is tolerating well at this time.  He will need lifelong anticoagulation. ? ?He did have elevated troponins initially felt likely due to right heart strain.  He did have an echo showing EF of 40% with some wall motion abnormality that is new he may have had NSTEMI.  Cardiology followed during hospitalization.  Losartan was held during hospitalization due to acute kidney injury.  He was started on BiDil for better blood pressure control.  Recommended that he restart losartan if kidney function improves.  He does have follow-up with cardiology as well. ? ?He was having some increased anxiety during his hospitalization.  Seroquel was added to help with this. ? ?PT and OT were consulted during hospitalization due to generalized weakness.  Home health consulted prior to discharge. ? ?He did have some hypoglycemic episodes during his hospitalization.  He was not eating well during his hospitalization.  Appetite still has not fully returned.  Blood sugars at home have been fairly stable. ? ?He has had some new headaches and has noted some memory change.  They are requesting an MRI. ? ?ROS:  A comprehensive ROS was  completed and negative except as noted per HPI ? ?Allergies  ?Allergen Reactions  ? Benazepril Cough  ? Gabapentin Swelling  ? Hydrocodone   ? Shellfish Allergy   ? ? ?Past Medical History:  ?Diagnosis Date  ? Autoimmune disease ([place])   ? Bronchitis   ? Chronic hiccups   ? Diabetes mellitus without complication ([place])   ? GERD (gastroesophageal reflux disease)   ? Hypothyroidism   ? Joint pain   ? Necrotizing myopathy   ? PTSD (post-traumatic stress disorder)   ? ? ?Past Surgical History:  ?Procedure Laterality Date  ? BIOPSY SHOULDER    ? Left  ? IR ANGIOGRAM PULMONARY BILATERAL SELECTIVE  07/22/2021  ? IR THROMBECT VENO MECH MOD SED  07/22/2021  ? IR US GUIDE VASC ACCESS LEFT  07/22/2021  ? RADIOLOGY WITH ANESTHESIA Right 07/22/2021  ? Procedure: IR WITH ANESTHESIA;  Surgeon: Radiologist, Medication, MD;  Location: [facility];  Service: Radiology;  Laterality: Right;  ? UPPER LEG SOFT TISSUE BIOPSY Right   ? ? ?Social History  ? ?Socioeconomic History  ? Marital status: Married  ?  Spouse name: Not on file  ? Number of children: 4  ? Years of education: 58  ? Highest education level: Not on file  ?Occupational History  ? Occupation: Retired  ?Tobacco Use  ? Smoking status: Never  ? Smokeless tobacco: Never  ?Vaping Use  ? Vaping Use: Never used  ?Substance and Sexual Activity  ? Alcohol use: No  ? Drug use: No  ? Sexual activity: Not on file  ?  Other Topics Concern  ? Not on file  ?Social History Narrative  ? Lives at home with his wife and son.  ? Right-handed.  ? 2 cups caffeine per day.  ? ?Social Determinants of Health  ? ?Financial Resource Strain: Not on file  ?Food Insecurity: Not on file  ?Transportation Needs: Not on file  ?Physical Activity: Not on file  ?Stress: Not on file  ?Social Connections: Not on file  ? ? ?Family History  ?Problem Relation Age of Onset  ? Hypertension Mother   ? Diabetes Mother   ? Arthritis Mother   ? Depression Mother   ? Heart attack Father   ? Hypertension Father   ? ? ?Health  Maintenance  ?Topic Date Due  ? OPHTHALMOLOGY EXAM  08/09/2021 (Originally 10/29/1960)  ? COVID-19 Vaccine (6 - Booster for Moderna series) 08/25/2021 (Originally 07/02/2020)  ? FOOT EXAM  09/28/2021 (Originally 10/29/1960)  ? Zoster Vaccines- Shingrix (1 of 2) 11/08/2021 (Originally 10/29/1969)  ? COLONOSCOPY (Pts 45-62yr Insurance coverage will need to be confirmed)  08/10/2022 (Originally 10/28/1993)  ? TETANUS/TDAP  08/19/2021  ? INFLUENZA VACCINE  11/28/2021  ? HEMOGLOBIN A1C  01/25/2022  ? Pneumonia Vaccine 71 Years old  Completed  ? Hepatitis C Screening  Completed  ? HPV VACCINES  Aged Out  ? ? ? ?----------------------------------------------------------------------------------------------------------------------------------------------------------------------------------------------------------------- ?Physical Exam ?BP 119/65 (BP Location: Left Arm, Patient Position: Sitting, Cuff Size: Normal)   Pulse (!) 109   Ht '5\' 8"'$  (1.727 m)   Wt 194 lb (88 kg)   SpO2 97%   BMI 29.50 kg/m?  ? ?Physical Exam ?Constitutional:   ?   Appearance: Normal appearance.  ?Eyes:  ?   General: No scleral icterus. ?Cardiovascular:  ?   Rate and Rhythm: Normal rate and regular rhythm.  ?Pulmonary:  ?   Effort: Pulmonary effort is normal.  ?   Breath sounds: Normal breath sounds.  ?Musculoskeletal:  ?   Cervical back: Neck supple.  ?Neurological:  ?   Mental Status: He is alert.  ?Psychiatric:     ?   Mood and Affect: Mood normal.     ?   Behavior: Behavior normal.  ? ? ?------------------------------------------------------------------------------------------------------------------------------------------------------------------------------------------------------------------- ?Assessment and Plan ? ?Acute pulmonary embolism ([place] ?Status post thrombectomy.  He will remain on anticoagulation with Eliquis.  He will need lifelong anticoagulation and will need to remain off testosterone injections. ? ?Benign essential HTN ?Blood  pressure is stable at this time.  However would preferably like to get him off of BiDil if he is having increased lower extremity swelling he would prefer to limit the number of medications he is needed taking today.  If renal function has not improved we discussed restarting losartan. ? ?Non-ST elevation (NSTEMI) myocardial infarction ([facility] ?Slightly reduced ejection fraction noted on echocardiogram.  He does have follow-up with cardiology. ? ?Acute respiratory failure with hypoxia ([place] ?No oxygen requirement at this time.  Secondary to his PE. ? ?DM2 (diabetes mellitus, type 2) ([place] ?He did have some hypoglycemic episodes in the hospital.  Appetite still has not fully returned.  We will continue his home regimen however we discussed working on dietary compliance.  He will continue to see endocrinology for management. ? ?Low testosterone ?He will need to remain off of testosterone ? ?New onset of headaches after age 71?He is having new onset headaches as well as some memory changes.  MRI brain ordered for further evaluation.  We will give diazepam 10 mg 1 hour prior to the MRI. ? ?  Acute kidney injury superimposed on CKD stage3a  ?Rechecking renal function. ? ? ?Meds ordered this encounter  ?Medications  ? diazepam (VALIUM) 10 MG tablet  ?  Sig: Take 1 tablet (10 mg total) by mouth once for 1 dose. Take 1 hours prior to MRI  ?  Dispense:  1 tablet  ?  Refill:  0  ? ? ?No follow-ups on file. ? ?45 minutes spent including pre visit preparation, review of prior notes and labs, encounter with patient  and same day documentation. ? ? ?This visit occurred during the SARS-CoV-2 public health emergency.  Safety protocols were in place, including screening questions prior to the visit, additional usage of staff PPE, and extensive cleaning of exam room while observing appropriate contact time as indicated for disinfecting solutions.  ? ?

## 2021-08-10 ENCOUNTER — Ambulatory Visit: Payer: Medicare Other | Admitting: Nurse Practitioner

## 2021-08-10 LAB — CBC WITH DIFFERENTIAL/PLATELET
Absolute Monocytes: 510 cells/uL (ref 200–950)
Basophils Absolute: 18 cells/uL (ref 0–200)
Basophils Relative: 0.2 %
Eosinophils Absolute: 70 cells/uL (ref 15–500)
Eosinophils Relative: 0.8 %
HCT: 40.8 % (ref 38.5–50.0)
Hemoglobin: 13.3 g/dL (ref 13.2–17.1)
Lymphs Abs: 1302 cells/uL (ref 850–3900)
MCH: 31.4 pg (ref 27.0–33.0)
MCHC: 32.6 g/dL (ref 32.0–36.0)
MCV: 96.5 fL (ref 80.0–100.0)
MPV: 10.9 fL (ref 7.5–12.5)
Monocytes Relative: 5.8 %
Neutro Abs: 6899 cells/uL (ref 1500–7800)
Neutrophils Relative %: 78.4 %
Platelets: 234 10*3/uL (ref 140–400)
RBC: 4.23 10*6/uL (ref 4.20–5.80)
RDW: 13.2 % (ref 11.0–15.0)
Total Lymphocyte: 14.8 %
WBC: 8.8 10*3/uL (ref 3.8–10.8)

## 2021-08-10 LAB — COMPLETE METABOLIC PANEL WITH GFR
AG Ratio: 2.2 (calc) (ref 1.0–2.5)
ALT: 24 U/L (ref 9–46)
AST: 19 U/L (ref 10–35)
Albumin: 4 g/dL (ref 3.6–5.1)
Alkaline phosphatase (APISO): 52 U/L (ref 35–144)
BUN: 24 mg/dL (ref 7–25)
CO2: 29 mmol/L (ref 20–32)
Calcium: 10.3 mg/dL (ref 8.6–10.3)
Chloride: 104 mmol/L (ref 98–110)
Creat: 1.18 mg/dL (ref 0.70–1.28)
Globulin: 1.8 g/dL (calc) — ABNORMAL LOW (ref 1.9–3.7)
Glucose, Bld: 197 mg/dL — ABNORMAL HIGH (ref 65–99)
Potassium: 5.1 mmol/L (ref 3.5–5.3)
Sodium: 144 mmol/L (ref 135–146)
Total Bilirubin: 0.8 mg/dL (ref 0.2–1.2)
Total Protein: 5.8 g/dL — ABNORMAL LOW (ref 6.1–8.1)
eGFR: 66 mL/min/{1.73_m2} (ref >=60)

## 2021-08-13 ENCOUNTER — Ambulatory Visit (INDEPENDENT_AMBULATORY_CARE_PROVIDER_SITE_OTHER): Payer: Medicare Other

## 2021-08-13 DIAGNOSIS — R4182 Altered mental status, unspecified: Secondary | ICD-10-CM | POA: Diagnosis not present

## 2021-08-13 DIAGNOSIS — G319 Degenerative disease of nervous system, unspecified: Secondary | ICD-10-CM | POA: Diagnosis not present

## 2021-08-13 DIAGNOSIS — R519 Headache, unspecified: Secondary | ICD-10-CM

## 2021-08-13 DIAGNOSIS — R413 Other amnesia: Secondary | ICD-10-CM

## 2021-08-14 ENCOUNTER — Encounter (HOSPITAL_BASED_OUTPATIENT_CLINIC_OR_DEPARTMENT_OTHER): Payer: Self-pay | Admitting: Family

## 2021-08-14 ENCOUNTER — Ambulatory Visit (INDEPENDENT_AMBULATORY_CARE_PROVIDER_SITE_OTHER): Payer: Medicare Other | Admitting: Family

## 2021-08-14 VITALS — BP 134/74 | HR 91 | Ht 68.0 in | Wt 193.8 lb

## 2021-08-14 DIAGNOSIS — I10 Essential (primary) hypertension: Secondary | ICD-10-CM

## 2021-08-14 DIAGNOSIS — Z86718 Personal history of other venous thrombosis and embolism: Secondary | ICD-10-CM | POA: Diagnosis not present

## 2021-08-14 DIAGNOSIS — Z86711 Personal history of pulmonary embolism: Secondary | ICD-10-CM | POA: Diagnosis not present

## 2021-08-14 DIAGNOSIS — I119 Hypertensive heart disease without heart failure: Secondary | ICD-10-CM | POA: Diagnosis not present

## 2021-08-14 DIAGNOSIS — D6859 Other primary thrombophilia: Secondary | ICD-10-CM | POA: Diagnosis not present

## 2021-08-14 MED ORDER — CARVEDILOL 6.25 MG PO TABS: 6.2500 mg | ORAL_TABLET | Freq: Two times a day (BID) | ORAL | 1 refills | Status: DC

## 2021-08-14 MED ORDER — ATORVASTATIN CALCIUM 80 MG PO TABS: 80.0000 mg | ORAL_TABLET | Freq: Every day | ORAL | 1 refills | Status: DC

## 2021-08-14 MED ORDER — METOPROLOL TARTRATE 100 MG PO TABS: ORAL_TABLET | ORAL | 0 refills | Status: DC

## 2021-08-14 MED ORDER — APIXABAN 5 MG PO TABS: 5.0000 mg | ORAL_TABLET | Freq: Two times a day (BID) | ORAL | 11 refills | Status: DC

## 2021-08-14 MED ORDER — HYDRALAZINE HCL 25 MG PO TABS: 25.0000 mg | ORAL_TABLET | Freq: Two times a day (BID) | ORAL | 1 refills | Status: DC

## 2021-08-14 MED ORDER — FUROSEMIDE 20 MG PO TABS: 20.0000 mg | ORAL_TABLET | Freq: Every day | ORAL | 1 refills | Status: DC

## 2021-08-14 NOTE — Patient Instructions (Addendum)
Medication Instructions:  ?Your physician has recommended you make the following change in your medication:  ? ?Stop: Imdur ? ?Start: Losartan '25mg'$  daily  ? ?Change: You may take an extra tablet of your Lasix if you notice swelling or gain 2 pounds overnight or 5 pounds in one week ? ?Take one '100mg'$  tablet of Metoprolol Tartrate 2 hours prior to Cardiac CTA ? ?*If you need a refill on your cardiac medications before your next appointment, please call your pharmacy* ? ? ?Lab Work: ?Please return for Lab work in 2 weeks for [employer]. You may come to the...  ? ?[facility] (3rd floor) ?[address], [place], [place]  ?Open: 8am-Noon and 1pm-4:30pm  ? ?[place] at [facility] ?[place]  ? ?[employer]- Any location ? ?**no appointments needed** ? ?If you have labs (blood work) drawn today and your tests are completely normal, you will receive your results only by: ?MyChart Message (if you have MyChart) OR ?A paper copy in the mail ?If you have any lab test that is abnormal or we need to change your treatment, we will call you to review the results. ? ? ?Testing/Procedures: ? ? ?Your cardiac CT will be scheduled at one of the below locations:  ? ?[facility] ?[address] ?[place], [place] [address] ?[phone] ? ?If scheduled at [facility], please arrive at the [facility] and Children's Entrance (Entrance C2) of [facility] 30 minutes prior to test start time. ?You can use the FREE valet parking offered at entrance C (encouraged to control the heart rate for the test)  ?Proceed to the [facility] Radiology Department (first floor) to check-in and test prep. ? ?All radiology patients and guests should use entrance C2 at [facility], accessed from [facility], even though the hospital's physical address listed is [address] ? ? ? ?Please follow these instructions carefully (unless otherwise  directed): ? ?Hold all erectile dysfunction medications at least 3 days (72 hrs) prior to test. ? ?On the Night Before the Test: ?Be sure to Drink plenty of water. ?Do not consume any caffeinated/decaffeinated beverages or chocolate 12 hours prior to your test. ?Do not take any antihistamines 12 hours prior to your test. ? ?On the Day of the Test: ?Drink plenty of water until 1 hour prior to the test. ?Do not eat any food 4 hours prior to the test. ?You may take your regular medications prior to the test.  ?Take metoprolol (Lopressor) two hours prior to test. ?HOLD Furosemide/Hydrochlorothiazide morning of the test. ?     ?After the Test: ?Drink plenty of water. ?After receiving IV contrast, you may experience a mild flushed feeling. This is normal. ?On occasion, you may experience a mild rash up to 24 hours after the test. This is not dangerous. If this occurs, you can take Benadryl 25 mg and increase your fluid intake. ?If you experience trouble breathing, this can be serious. If it is severe call 911 IMMEDIATELY. If it is mild, please call our office. ?If you take any of these medications: Glipizide/Metformin, Avandament, Glucavance, please do not take 48 hours after completing test unless otherwise instructed. ? ?We will call to schedule your test 2-4 weeks out understanding that some insurance companies will need an authorization prior to the service being performed.  ? ?For non-scheduling related questions, please contact the cardiac imaging nurse navigator should you have any questions/concerns: ?Marchia Bond, Cardiac Imaging Nurse  Navigator ?Gordy Clement, Cardiac Imaging Nurse Navigator ?[facility] ?Direct Office Dial: [phone]  ? ?For scheduling needs, including cancellations and rescheduling, please call [place], [phone]. ? ? ? ?Follow-Up: ?At [facility], you and your health needs are our priority.  As part of our continuing mission to provide you with  exceptional heart care, we have created designated Provider Care Teams.  These Care Teams include your primary Cardiologist (physician) and Advanced Practice Providers (APPs -  Physician Assistants and Nurse Practitioners) who all work together to provide you with the care you need, when you need it. ? ?We recommend signing up for the patient portal called "MyChart".  Sign up information is provided on this After Visit Summary.  MyChart is used to connect with patients for Virtual Visits (Telemedicine).  Patients are able to view lab/test results, encounter notes, upcoming appointments, etc.  Non-urgent messages can be sent to your provider as well.   ?To learn more about what you can do with MyChart, go to [email or web address].   ? ?Your next appointment:   ?2 month(s) ? ?The format for your next appointment:   ?In Person ? ?Provider:   ?Pixie Casino, MD  or Laurann Montana, NP { ? ?Other Instructions ?Heart Healthy Diet Recommendations: ?A low-salt diet is recommended. Meats should be grilled, baked, or boiled. Avoid fried foods. Focus on lean protein sources like fish or chicken with vegetables and fruits. The American Heart Association is a Microbiologist!  American Heart Association Diet and Lifeystyle Recommendations  ? ?To prevent or reduce lower extremity swelling: ?Eat a low salt diet. Salt makes the body hold onto extra fluid which causes swelling. ?Sit with legs elevated. For example, in the recliner or on an [place].  ?Wear knee-high compression stockings during the daytime. Ones labeled 15-20 mmHg provide good compression. ? ? ?Important Information About Sugar ? ? ? ? ? ? ? ? ?

## 2021-08-14 NOTE — Progress Notes (Signed)
? ?Office Visit  ?  ?Patient Name: [name] ?Date of Encounter: 08/14/2021 ? ?PCP:  [name], DO ?  ?[place]  ?Cardiologist:  Pixie Casino, MD  ?Advanced Practice Provider:  No care team member to display ?Electrophysiologist:  None   ? ?Chief Complaint  ?  ?[name] is a 71 y.o. male with a hx of hypertension, DM2, autoimmune myositis, PTSD, PE, DVT, HFrEF, CKD 3  presents today for hospital follow up  ? ?Past Medical History  ?  ?Past Medical History:  ?Diagnosis Date  ? Autoimmune disease ([place])   ? Bronchitis   ? Chronic hiccups   ? Diabetes mellitus without complication ([place])   ? GERD (gastroesophageal reflux disease)   ? Hypothyroidism   ? Joint pain   ? Necrotizing myopathy   ? PTSD (post-traumatic stress disorder)   ? ?Past Surgical History:  ?Procedure Laterality Date  ? BIOPSY SHOULDER    ? Left  ? IR ANGIOGRAM PULMONARY BILATERAL SELECTIVE  07/22/2021  ? IR THROMBECT VENO MECH MOD SED  07/22/2021  ? IR US GUIDE VASC ACCESS LEFT  07/22/2021  ? RADIOLOGY WITH ANESTHESIA Right 07/22/2021  ? Procedure: IR WITH ANESTHESIA;  Surgeon: Radiologist, Medication, MD;  Location: [facility];  Service: Radiology;  Laterality: Right;  ? UPPER LEG SOFT TISSUE BIOPSY Right   ? ? ?Allergies ? ?Allergies  ?Allergen Reactions  ? Benazepril Cough  ? Gabapentin Swelling  ? Hydrocodone   ? Shellfish Allergy   ? ? ?History of Present Illness  ?  ?[name] is a 71 y.o. male with a hx of hypertension, DM2, autoimmune myositis, PTSD, PE, DVT, HFrEF, CKD 3 last seen while hospitalized. ? ?He was admitted 07/21/2021 and diagnosed with acute pulmonary embolism as well as acute RLE DVT with acute hypoxic respiratory failure.  This was in the setting of testosterone injections were discontinued.  He was started on heparin and transitioned to Eliquis.  Echo during admission 07/23/2021 with LVEF 35-40%, wall motion abnormalities noted, grade 1 diastolic dysfunction, trivial MR, mild AI, mild dilation  ascending aorta 41 mm.  Due to AKI he was recommended for outpatient ischemic evaluation. ? ?He was seen by PCP 08/09/2021 noting persistent headache and MRI was ordered revealing no evidence of acute intracranial abnormality, mild chronic small vessel ischemic changes within the white matter, mild generalized parenchymal atrophy. ? ?He presents today for follow-up with his wife.  Since discharge notes occasional left-sided chest discomfort which feels like a "pinch "and occurs both at rest and with activity.  No noted aggravating or relieving factors.  He tells me his legs feel "heavy "but swelling has been overall improving.  He has not had a headache since he saw his primary last week.  He continues to take Lasix 20 mg daily and elevate his legs when sitting.  Does not wear compression socks.  He is working to wean off of tramadol with the assistance of his PCP.  Overall he is interested in being on fewer medications.  We reviewed echocardiogram and the pathophysiology, treatment of heart failure. ? ?EKGs/Labs/Other Studies Reviewed:  ? ?The following studies were reviewed today: ? ?Echo 07/23/21: ? 1. Left ventricular ejection fraction, by estimation, is 35 to 40%. Left  ?ventricular ejection fraction by 2D MOD biplane is 35.4 %. The left  ?ventricle has moderately decreased function. The left ventricle  ?demonstrates regional wall motion abnormalities  ?(see scoring diagram/findings for description). Left ventricular diastolic  ?parameters are  consistent with Grade I diastolic dysfunction (impaired  ?relaxation). There is severe hypokinesis of the left ventricular, entire  ?inferoseptal wall, inferior wall,  ?apical segment and inferolateral wall.  ? 2. Poorly visualized RV, however, based on TAPSE and RsVel, the right  ?ventricular systolic function is normal.  ? 3. The mitral valve is abnormal. Trivial mitral valve regurgitation.  ? 4. The aortic valve is tricuspid. Aortic valve regurgitation is mild.  ?Aortic  valve sclerosis is present, with no evidence of aortic valve  ?stenosis.  ? 5. Aortic dilatation noted. There is mild dilatation of the ascending  ?aorta, measuring 41 mm.  ? 6. The inferior vena cava is normal in size with greater than 50%  ?respiratory variability, suggesting right atrial pressure of 3 mmHg.  ? ?EKG: No EKG today ? ?Recent Labs: ?07/26/2021: B Natriuretic Peptide 51.2; Magnesium 1.9 ?08/09/2021: ALT 24; BUN 24; Creat 1.18; Hemoglobin 13.3; Platelets 234; Potassium 5.1; Sodium 144  ?Recent Lipid Panel ?   ?Component Value Date/Time  ? CHOL 182 07/24/2021 0022  ? TRIG 132 07/24/2021 0022  ? HDL 41 07/24/2021 0022  ? CHOLHDL 4.4 07/24/2021 0022  ? VLDL 26 07/24/2021 0022  ? [place] 115 (H) 07/24/2021 0022  ? LDLCALC 131 (H) 09/22/2019 5462  ? LDLDIRECT 72.0 04/01/2018 1035  ? ? ?Home Medications  ? ?Current Meds  ?Medication Sig  ? acarbose (PRECOSE) 100 MG tablet Take 100 mg by mouth 3 (three) times daily with meals.  ? apixaban (ELIQUIS) 5 MG TABS tablet Take 1 tablet (5 mg total) by mouth 2 (two) times daily.  ? atorvastatin (LIPITOR) 80 MG tablet Take 1 tablet (80 mg total) by mouth at bedtime.  ? baclofen (LIORESAL) 20 MG tablet Take 20 mg by mouth 2 (two) times daily.  ? calcium carbonate (OS-CAL) 600 MG TABS tablet Take 600 mg by mouth 2 (two) times daily with a meal.  ? carvedilol (COREG) 6.25 MG tablet Take 1 tablet (6.25 mg total) by mouth 2 (two) times daily with a meal.  ? folic acid (FOLVITE) 1 MG tablet Take 1 mg by mouth daily.  ? furosemide (LASIX) 20 MG tablet Take 20 mg by mouth daily.  ? glipiZIDE (GLUCOTROL XL) 10 MG 24 hr tablet Take 10 mg by mouth 2 (two) times daily.   ? hydrALAZINE (APRESOLINE) 25 MG tablet Take 1 tablet (25 mg total) by mouth every 8 (eight) hours.  ? hydrOXYzine (ATARAX/VISTARIL) 25 MG tablet TAKE 1 TABLET (25 MG TOTAL) BY MOUTH 3 (THREE) TIMES DAILY AS NEEDED. FOR ITCHING  ? insulin glargine (LANTUS) 100 UNIT/ML injection Inject 0.46-0.56 mLs (46-56 Units  total) into the skin at bedtime. For now take 30 units twice a day and then increase to your usual dose as your oral intake improves. Increase both doses by 2units every day. Check your blood sugars at home three times daily. Call your doctor if sugar level is more than 300 or less than 80. (Patient taking differently: Inject 42-44 Units into the skin 2 (two) times daily. Check your blood sugars at home three times daily. Call your doctor if sugar level is more than 300 or less than 80.)  ? isosorbide mononitrate (IMDUR) 30 MG 24 hr tablet Take 1 tablet (30 mg total) by mouth daily.  ? Levothyroxine Sodium 88 MCG CAPS Take 88 mcg by mouth daily.  ? metFORMIN (GLUCOPHAGE-XR) 500 MG 24 hr tablet Take 2,000 mg by mouth daily.  ? methotrexate (RHEUMATREX) 5 MG tablet Take 2  tablets (10 mg total) by mouth once a week. Caution: Chemotherapy. Protect from light. Take on fridays per patient. ? ?OKAY TO RESUME FROM 10/08/14. (Patient taking differently: Take 30 mg by mouth once a week. Caution: Chemotherapy. Protect from light. Take on fridays per patient. 6 Tabs once a week)  ? nystatin (MYCOSTATIN/NYSTOP) powder SMARTSIG:1 Application Topical 2-3 Times Daily  ? pantoprazole (PROTONIX) 40 MG tablet TAKE 1 TABLET BY MOUTH EVERY DAY  ? polyvinyl alcohol (LIQUIFILM TEARS) 1.4 % ophthalmic solution 1 drop as needed for dry eyes.  ? QUEtiapine (SEROQUEL) 25 MG tablet Take 1 tablet (25 mg total) by mouth at bedtime.  ? sertraline (ZOLOFT) 100 MG tablet Take 100 mg by mouth daily.  ? traMADol (ULTRAM) 50 MG tablet Take 100 mg by mouth 3 (three) times daily as needed for moderate pain.  ? triamcinolone cream (KENALOG) 0.1 % Use 1 application twice daily as needed to red itchy areas below the face  ?  ? ?Review of Systems  ?   ?All other systems reviewed and are otherwise negative except as noted above. ? ?Physical Exam  ?  ?VS:  BP 134/74   Pulse 91   Ht '5\' 8"'$  (1.727 m)   Wt 193 lb 12.8 oz (87.9 kg)   SpO2 98%   BMI 29.47 kg/m?   , BMI Body mass index is 29.47 kg/m?. ? ?Wt Readings from Last 3 Encounters:  ?08/14/21 193 lb 12.8 oz (87.9 kg)  ?08/09/21 194 lb (88 kg)  ?07/25/21 199 lb 8.3 oz (90.5 kg)  ?  ? ?GEN: Well nourished, overweig

## 2021-08-23 ENCOUNTER — Other Ambulatory Visit: Payer: Self-pay

## 2021-08-23 MED ORDER — QUETIAPINE FUMARATE 25 MG PO TABS: 25.0000 mg | ORAL_TABLET | Freq: Every day | ORAL | 1 refills | Status: DC

## 2021-08-23 NOTE — Telephone Encounter (Signed)
Hyder's wife called and left a message stating he needs a refill on Seroquel. He was prescribed this at the hospital. Pended prescription.  ?

## 2021-08-25 ENCOUNTER — Telehealth (HOSPITAL_COMMUNITY): Payer: Self-pay | Admitting: Emergency Medicine

## 2021-08-25 NOTE — Telephone Encounter (Signed)
Attempted to call patient regarding upcoming cardiac CT appointment. °Left message on voicemail with name and callback number °Fransico Sciandra RN Navigator Cardiac Imaging °[facility] °[phone] Office °[phone] Cell ° °

## 2021-08-28 ENCOUNTER — Encounter (HOSPITAL_BASED_OUTPATIENT_CLINIC_OR_DEPARTMENT_OTHER): Payer: Self-pay

## 2021-08-28 ENCOUNTER — Ambulatory Visit (HOSPITAL_COMMUNITY)
Admission: RE | Admit: 2021-08-28 | Discharge: 2021-08-28 | Disposition: A | Discharge status: Home / Self Care | Payer: Medicare Other | Source: Ambulatory Visit | Attending: Cardiology | Admitting: Cardiology

## 2021-08-28 ENCOUNTER — Ambulatory Visit (HOSPITAL_COMMUNITY)
Admission: RE | Admit: 2021-08-28 | Discharge: 2021-08-28 | Disposition: A | Discharge status: Home / Self Care | Payer: Medicare Other | Source: Ambulatory Visit | Attending: Family | Admitting: Family

## 2021-08-28 ENCOUNTER — Other Ambulatory Visit (HOSPITAL_COMMUNITY): Payer: Self-pay | Admitting: Emergency Medicine

## 2021-08-28 DIAGNOSIS — R931 Abnormal findings on diagnostic imaging of heart and coronary circulation: Secondary | ICD-10-CM

## 2021-08-28 DIAGNOSIS — I2510 Atherosclerotic heart disease of native coronary artery without angina pectoris: Secondary | ICD-10-CM

## 2021-08-28 DIAGNOSIS — Z86711 Personal history of pulmonary embolism: Secondary | ICD-10-CM | POA: Diagnosis not present

## 2021-08-28 DIAGNOSIS — R079 Chest pain, unspecified: Secondary | ICD-10-CM | POA: Diagnosis not present

## 2021-08-28 DIAGNOSIS — I119 Hypertensive heart disease without heart failure: Secondary | ICD-10-CM | POA: Insufficient documentation

## 2021-08-28 MED ORDER — NITROGLYCERIN 0.4 MG SL SUBL
0.8000 mg | SUBLINGUAL_TABLET | Freq: Once | SUBLINGUAL | Status: AC
Start: 2021-08-28 — End: 2021-08-28
  Administered 2021-08-28: 0.8 mg via SUBLINGUAL

## 2021-08-28 MED ORDER — IOHEXOL 350 MG/ML SOLN
100.0000 mL | Freq: Once | INTRAVENOUS | Status: AC | PRN
  Administered 2021-08-28: 100 mL via INTRAVENOUS

## 2021-08-28 MED ORDER — METOPROLOL TARTRATE 5 MG/5ML IV SOLN
5.0000 mg | INTRAVENOUS | Status: DC | PRN
  Administered 2021-08-28 (×3): 5 mg via INTRAVENOUS

## 2021-08-28 MED ORDER — DILTIAZEM HCL 25 MG/5ML IV SOLN
10.0000 mg | Freq: Once | INTRAVENOUS | Status: AC
  Administered 2021-08-28: 10 mg via INTRAVENOUS

## 2021-08-28 MED ORDER — SODIUM CHLORIDE 0.9 % IV SOLN
Freq: Once | INTRAVENOUS | Status: AC
  Administered 2021-08-28: 250 mL via INTRAVENOUS

## 2021-08-28 MED ORDER — METOPROLOL TARTRATE 5 MG/5ML IV SOLN
INTRAVENOUS | Status: AC
  Filled 2021-08-28: qty 5

## 2021-08-28 MED ORDER — METOPROLOL TARTRATE 5 MG/5ML IV SOLN
INTRAVENOUS | Status: DC
Start: 2021-08-28 — End: 2021-08-29
  Filled 2021-08-28: qty 5

## 2021-08-28 MED ORDER — DILTIAZEM HCL 25 MG/5ML IV SOLN
INTRAVENOUS | Status: AC
  Filled 2021-08-28: qty 5

## 2021-08-28 MED ORDER — NITROGLYCERIN 0.4 MG SL SUBL
SUBLINGUAL_TABLET | SUBLINGUAL | Status: DC
Start: 2021-08-28 — End: 2021-08-29
  Filled 2021-08-28: qty 2

## 2021-08-30 DIAGNOSIS — R931 Abnormal findings on diagnostic imaging of heart and coronary circulation: Secondary | ICD-10-CM

## 2021-09-05 ENCOUNTER — Telehealth (HOSPITAL_BASED_OUTPATIENT_CLINIC_OR_DEPARTMENT_OTHER): Payer: Self-pay | Admitting: Family

## 2021-09-05 DIAGNOSIS — Z86711 Personal history of pulmonary embolism: Secondary | ICD-10-CM | POA: Diagnosis not present

## 2021-09-05 LAB — BASIC METABOLIC PANEL
BUN/Creatinine Ratio: 21 (ref 10–24)
BUN: 24 mg/dL (ref 8–27)
CO2: 22 mmol/L (ref 20–29)
Calcium: 10.6 mg/dL — ABNORMAL HIGH (ref 8.6–10.2)
Chloride: 100 mmol/L (ref 96–106)
Creatinine, Ser: 1.14 mg/dL (ref 0.76–1.27)
Glucose: 100 mg/dL — ABNORMAL HIGH (ref 70–99)
Potassium: 4.8 mmol/L (ref 3.5–5.2)
Sodium: 139 mmol/L (ref 134–144)
eGFR: 68 mL/min/{1.73_m2} (ref >59)

## 2021-09-05 NOTE — Telephone Encounter (Signed)
Call back patient, this time he answered.  Describes chest pain as sharp and intermittent.  Denies pain radiation, shortness of breath, diaphoresis and nausea. Pain eventually subsides on it's own.  Pt was "burping" often while on the phone. He says he takes medications for "gas."  Pt is not having active chest pain at the time of the call.  He understands to call EMS if his symptoms worsen, or pain persists.  Pt verbalizes understanding.  TSuits [facility]HA RN CCM. ?

## 2021-09-05 NOTE — Telephone Encounter (Signed)
Received message from pt. Regarding chest pain, attempted to return his call.  Message left on answering machine instruction pt if he is having active chest pain he needs to call EMS or go the ED.  ?

## 2021-09-05 NOTE — Telephone Encounter (Signed)
Patient stated that he is having a concerning sharp pain in his chest. Pain lasts about 20 to 30 minutes and sometimes over an hour(on the left side).  ?

## 2021-09-06 ENCOUNTER — Telehealth (HOSPITAL_BASED_OUTPATIENT_CLINIC_OR_DEPARTMENT_OTHER): Payer: Self-pay

## 2021-09-06 NOTE — Telephone Encounter (Addendum)
RN calling patient to follow up on how he is feeling, no answer, left message to call back.  ? ? ? ? ? ? ?----- Message from Chapman Moss, RN sent at 09/05/2021 12:00 PM EDT ----- ?Regarding: Intermittent chest pain. ?Hi [name], ? ?Pt was not having active chest pain when I spoke with him.  Please see my notes.  I told him you would contact him when you were in the office.  He had a coronary CT on May 1st. ? ?Thanks ?Con Memos ? ?

## 2021-10-09 ENCOUNTER — Encounter (HOSPITAL_BASED_OUTPATIENT_CLINIC_OR_DEPARTMENT_OTHER): Payer: Self-pay | Admitting: Family

## 2021-10-09 ENCOUNTER — Ambulatory Visit (INDEPENDENT_AMBULATORY_CARE_PROVIDER_SITE_OTHER): Payer: Medicare Other | Admitting: Family

## 2021-10-09 VITALS — BP 122/84 | HR 94 | Ht 68.0 in | Wt 202.0 lb

## 2021-10-09 DIAGNOSIS — Z86711 Personal history of pulmonary embolism: Secondary | ICD-10-CM | POA: Diagnosis not present

## 2021-10-09 DIAGNOSIS — I25118 Atherosclerotic heart disease of native coronary artery with other forms of angina pectoris: Secondary | ICD-10-CM | POA: Diagnosis not present

## 2021-10-09 DIAGNOSIS — E785 Hyperlipidemia, unspecified: Secondary | ICD-10-CM | POA: Diagnosis not present

## 2021-10-09 DIAGNOSIS — R600 Localized edema: Secondary | ICD-10-CM | POA: Diagnosis not present

## 2021-10-09 DIAGNOSIS — I10 Essential (primary) hypertension: Secondary | ICD-10-CM | POA: Diagnosis not present

## 2021-10-09 MED ORDER — SPIRONOLACTONE 25 MG PO TABS: 12.5000 mg | ORAL_TABLET | Freq: Every day | ORAL | 3 refills | Status: DC

## 2021-10-09 NOTE — Patient Instructions (Signed)
Medication Instructions:  Your physician has recommended you make the following change in your medication:   START Spironolactone half tablet (12.'5mg'$ ) daily  CHANGE Furosemide to '40mg'$  (2 tablets) daily for 3 days THEN return to '20mg'$  (1 tablet) daily with additional tablet as needed for weight gain of 2 pounds overnight or 5 pounds in one week  *If you need a refill on your cardiac medications before your next appointment, please call your pharmacy*   Lab Work: Your physician recommends that you return for lab work today: BMP, BNP  If you have labs (blood work) drawn today and your tests are completely normal, you will receive your results only by: [place] (if you have [place]) OR A paper copy in the mail If you have any lab test that is abnormal or we need to change your treatment, we will call you to review the results.   Testing/Procedures: Your physician has requested that you have an echocardiogram in 2 months. Echocardiography is a painless test that uses sound waves to create images of your heart. It provides your doctor with information about the size and shape of your heart and how well your heart's chambers and valves are working. This procedure takes approximately one hour. There are no restrictions for this procedure.    Follow-Up: At [facility], you and your health needs are our priority.  As part of our continuing mission to provide you with exceptional heart care, we have created designated Provider Care Teams.  These Care Teams include your primary Cardiologist (physician) and Advanced Practice Providers (APPs -  Physician Assistants and Nurse Practitioners) who all work together to provide you with the care you need, when you need it.  We recommend signing up for the patient portal called "MyChart".  Sign up information is provided on this After Visit Summary.  MyChart is used to connect with patients for Virtual Visits (Telemedicine).  Patients are able to view  lab/test results, encounter notes, upcoming appointments, etc.  Non-urgent messages can be sent to your provider as well.   To learn more about what you can do with MyChart, go to [email or web address].    Your next appointment:   3 month(s)  The format for your next appointment:   In Person  Provider:   Raliegh Ip Mali Hilty, MD or Loel Dubonnet, NP     Other Instructions  To prevent or reduce lower extremity swelling: Eat a low salt diet. Salt makes the body hold onto extra fluid which causes swelling. Sit with legs elevated. For example, in the recliner or on an [place].  Wear knee-high compression stockings during the daytime. Ones labeled 15-20 mmHg provide good compression.  Recommend weighing daily and keeping a log. Please call our office if you have weight gain of 2 pounds overnight or 5 pounds in 1 week.   Date  Time Weight                                            Heart Healthy Diet Recommendations: A low-salt diet is recommended. Meats should be grilled, baked, or boiled. Avoid fried foods. Focus on lean protein sources like fish or chicken with vegetables and fruits. The American Heart Association is a Microbiologist!  American Heart Association Diet and Lifeystyle Recommendations

## 2021-10-09 NOTE — Progress Notes (Signed)
Office Visit    Patient Name: [name] Date of Encounter: 10/09/2021  PCP:  [name], [facility]  Cardiologist:  Pixie Casino, MD  Advanced Practice Provider:  No care team member to display Electrophysiologist:  None    Chief Complaint    [name] is a 71 y.o. male with a hx of hypertension, DM2, autoimmune myositis, PTSD, PE, DVT, HFrEF, CKD 3  presents today for follow-up after cardiac CTA  Past Medical History    Past Medical History:  Diagnosis Date   Autoimmune disease ([place])    Bronchitis    Chronic hiccups    Diabetes mellitus without complication (HCC)    GERD (gastroesophageal reflux disease)    Hypothyroidism    Joint pain    Necrotizing myopathy    PTSD (post-traumatic stress disorder)    Past Surgical History:  Procedure Laterality Date   BIOPSY SHOULDER     Left   IR ANGIOGRAM PULMONARY BILATERAL SELECTIVE  07/22/2021   IR THROMBECT VENO MECH MOD SED  07/22/2021   IR US GUIDE VASC ACCESS LEFT  07/22/2021   RADIOLOGY WITH ANESTHESIA Right 07/22/2021   Procedure: IR WITH ANESTHESIA;  Surgeon: Radiologist, Medication, MD;  Location: [facility];  Service: Radiology;  Laterality: Right;   UPPER LEG SOFT TISSUE BIOPSY Right     Allergies  Allergies  Allergen Reactions   Benazepril Cough   Gabapentin Swelling   Hydrocodone    Shellfish Allergy     History of Present Illness    [name] is a 71 y.o. male with a hx of hypertension, DM2, nonobstructive CAD, autoimmune myositis, PTSD, PE, DVT, HFrEF, CKD 3 last seen 07/2021.  He was admitted 07/21/2021 and diagnosed with acute pulmonary embolism as well as acute RLE DVT with acute hypoxic respiratory failure.  This was in the setting of testosterone injections were discontinued.  He was started on heparin and transitioned to Eliquis.  Echo during admission 07/23/2021 with LVEF 35-40%, wall motion abnormalities noted, grade 1 diastolic dysfunction, trivial MR, mild AI,  mild dilation ascending aorta 41 mm.  Due to AKI he was recommended for outpatient ischemic evaluation.  He was seen by PCP 08/09/2021 noting persistent headache and MRI was ordered revealing no evidence of acute intracranial abnormality, mild chronic small vessel ischemic changes within the white matter, mild generalized parenchymal atrophy.  Last seen 08/14/2021.  He noted occasional left-sided chest discomfort described as a pinch breath at rest and with activity.  Swelling was improving.  Cardiac CTA ordered for ischemic evaluation in the setting of new heart failure.  Imdur discontinued due to patient report of headache.  Cardiac CTA 08/28/2021 coronary calcium score of 129 placing him in the 64th percentile for age, gender. LM 1-24% stenosis and ostial/prox LAD mild 25-49% stenosis (FFR 0.88 prox and 0.82 mid - no significant stenosis).   He presents today for follow up with his wife. Weight up 9 pounds over last 2 months. He notes he is having lower extremity edema and having some feeling of tightness in the leg. Right more than left. No missed doses of eliquis. Tells me he purchased bicycle pedals which he has been doing for exercise. Notes occasional pain in  his testicles - nots not as much sexual drive and encouraged to discuss with urology. Reports his dyspnea on exertion is overall stable. No orthopnea, PND, chest pain.   EKGs/Labs/Other Studies Reviewed:   The following studies were reviewed today:  Cardiac CTA 08/28/21 FINDINGS: Non-cardiac: See separate report from [facility].   Pulmonary veins drain normally to the left atrium. No LA appendage thrombus noted.   Calcium Score: 129 Agatston units.   Coronary Arteries: Right dominant with no anomalies   LM: Mixed plaque distal left main, mild (1-24%) stenosis.   LAD system: Mixed plaque ostial/proximal LAD, mild (25-49%) stenosis.   Circumflex system: No plaque or stenosis.   RCA system: No plaque or stenosis.    IMPRESSION: 1. Coronary artery calcium score 129 Agatston units. This places the patient in the 64th percentile for age and gender, suggesting intermediate risk for future cardiac events.   2. Suspect mild (25-49%) stenosis in the ostial/proximal LAD. Will send for FFR to confirm.  FFR 1. Left Main: No significant stenosis.   2. LAD: 0.88 proximal LAD, 0.82 mid LAD. 3. LCX: No significant stenosis. 4. RCA: No significant stenosis.   IMPRESSION: 1. CT FFR analysis didn't show any significant stenosis in the ostial/proximal LAD.  Echo 07/23/21:  1. Left ventricular ejection fraction, by estimation, is 35 to 40%. Left  ventricular ejection fraction by 2D MOD biplane is 35.4 %. The left  ventricle has moderately decreased function. The left ventricle  demonstrates regional wall motion abnormalities  (see scoring diagram/findings for description). Left ventricular diastolic  parameters are consistent with Grade I diastolic dysfunction (impaired  relaxation). There is severe hypokinesis of the left ventricular, entire  inferoseptal wall, inferior wall,  apical segment and inferolateral wall.   2. Poorly visualized RV, however, based on TAPSE and RsVel, the right  ventricular systolic function is normal.   3. The mitral valve is abnormal. Trivial mitral valve regurgitation.   4. The aortic valve is tricuspid. Aortic valve regurgitation is mild.  Aortic valve sclerosis is present, with no evidence of aortic valve  stenosis.   5. Aortic dilatation noted. There is mild dilatation of the ascending  aorta, measuring 41 mm.   6. The inferior vena cava is normal in size with greater than 50%  respiratory variability, suggesting right atrial pressure of 3 mmHg.   EKG: No EKG today  Recent Labs: 07/26/2021: B Natriuretic Peptide 51.2; Magnesium 1.9 08/09/2021: ALT 24; Hemoglobin 13.3; Platelets 234 09/05/2021: BUN 24; Creatinine, Ser 1.14; Potassium 4.8; Sodium 139  Recent Lipid Panel     Component Value Date/Time   CHOL 182 07/24/2021 0022   TRIG 132 07/24/2021 0022   HDL 41 07/24/2021 0022   CHOLHDL 4.4 07/24/2021 0022   VLDL 26 07/24/2021 0022   LDLCALC 115 (H) 07/24/2021 0022   LDLCALC 131 (H) 09/22/2019 0858   LDLDIRECT 72.0 04/01/2018 1035    Home Medications   Current Meds  Medication Sig   acarbose (PRECOSE) 100 MG tablet Take 100 mg by mouth 3 (three) times daily with meals.   apixaban (ELIQUIS) 5 MG TABS tablet Take 1 tablet (5 mg total) by mouth 2 (two) times daily.   atorvastatin (LIPITOR) 80 MG tablet Take 1 tablet (80 mg total) by mouth at bedtime.   baclofen (LIORESAL) 20 MG tablet Take 20 mg by mouth 2 (two) times daily.   calcium carbonate (OS-CAL) 600 MG TABS tablet Take 600 mg by mouth 2 (two) times daily with a meal.   carvedilol (COREG) 6.25 MG tablet Take 1 tablet (6.25 mg total) by mouth 2 (two) times daily with a meal.   folic acid (FOLVITE) 1 MG tablet Take 1 mg by mouth daily.   furosemide (LASIX) 20 MG tablet  Take 1 tablet (20 mg total) by mouth daily. May take additional tablet ('20mg'$ ) as needed for weight gain of 2 pounds overnight or 5 pounds in 1 week.   glipiZIDE (GLUCOTROL XL) 10 MG 24 hr tablet Take 10 mg by mouth 2 (two) times daily.    hydrALAZINE (APRESOLINE) 25 MG tablet Take 1 tablet (25 mg total) by mouth in the morning and at bedtime.   hydrOXYzine (ATARAX/VISTARIL) 25 MG tablet TAKE 1 TABLET (25 MG TOTAL) BY MOUTH 3 (THREE) TIMES DAILY AS NEEDED. FOR ITCHING   insulin glargine (LANTUS) 100 UNIT/ML injection Inject 0.46-0.56 mLs (46-56 Units total) into the skin at bedtime. For now take 30 units twice a day and then increase to your usual dose as your oral intake improves. Increase both doses by 2units every day. Check your blood sugars at home three times daily. Call your doctor if sugar level is more than 300 or less than 80. (Patient taking differently: Inject 42-44 Units into the skin 2 (two) times daily. Check your blood sugars  at home three times daily. Call your doctor if sugar level is more than 300 or less than 80.)   Levothyroxine Sodium 88 MCG CAPS Take 88 mcg by mouth daily.   metFORMIN (GLUCOPHAGE-XR) 500 MG 24 hr tablet Take 2,000 mg by mouth daily.   methotrexate (RHEUMATREX) 5 MG tablet Take 2 tablets (10 mg total) by mouth once a week. Caution: Chemotherapy. Protect from light. Take on fridays per patient.  OKAY TO RESUME FROM 10/08/14. (Patient taking differently: Take 30 mg by mouth once a week. Caution: Chemotherapy. Protect from light. Take on fridays per patient. 6 Tabs once a week)   nystatin (MYCOSTATIN/NYSTOP) powder SMARTSIG:1 Application Topical 2-3 Times Daily   pantoprazole (PROTONIX) 40 MG tablet TAKE 1 TABLET BY MOUTH EVERY DAY   polyvinyl alcohol (LIQUIFILM TEARS) 1.4 % ophthalmic solution 1 drop as needed for dry eyes.   QUEtiapine (SEROQUEL) 25 MG tablet Take 1 tablet (25 mg total) by mouth at bedtime.   sertraline (ZOLOFT) 100 MG tablet Take 100 mg by mouth daily.   traMADol (ULTRAM) 50 MG tablet Take 100 mg by mouth 3 (three) times daily as needed for moderate pain.   triamcinolone cream (KENALOG) 0.1 % Use 1 application twice daily as needed to red itchy areas below the face     Review of Systems     All other systems reviewed and are otherwise negative except as noted above.  Physical Exam    VS:  BP 122/84   Pulse 94   Ht '5\' 8"'$  (1.727 m)   Wt 202 lb (91.6 kg)   BMI 30.71 kg/m  , BMI Body mass index is 30.71 kg/m.  Wt Readings from Last 3 Encounters:  10/09/21 202 lb (91.6 kg)  08/14/21 193 lb 12.8 oz (87.9 kg)  08/09/21 194 lb (88 kg)     GEN: Well nourished, overweight, well developed, in no acute distress. HEENT: normal. Neck: Supple, no JVD, carotid bruits, or masses. Cardiac: RRR, no murmurs, rubs, or gallops. No clubbing, cyanosis. RLE 1+ pitting edema. Radials/PT 2+ and equal bilaterally.  Respiratory:  Respirations regular and unlabored, clear to auscultation  bilaterally. GI: Soft, nontender, nondistended. MS: No deformity or atrophy. Skin: Warm and dry, no rash. Neuro:  Strength and sensation are intact. Psych: Normal affect.  Assessment & Plan    PE / Hx of DVT - In setting of testosterone injections, admission 06/2021 with acute saddle PE with right heart  strain and right popliteal s/p thrombectomy 07/22/21.  Continue Eliquis 5 mg twice daily.  Will require lifelong anticoagulation.  Does not meet dose reduction criteria.  Denies bleeding complications.  Refill of Eliquis provided.  Future refills may come from our office or primary care.  HTN - Goal BP <130/80.  BP at goal today. Continue carvedilol, hydralazine at current dose.   Home monitoring encouraged.  Nonobstructive CAD - Cardiac CTA 08/28/21 moderate nonobstructive disease. No anginal symptoms. Continue Coreg, Atorvastatin. No ASA due to [facility]. Heart healthy diet and regular cardiovascular exercise encouraged.    CKD3a - 09/05/21 creatinine 1.14, GFR 68.  Improved since most recent admission to his baseline. Careful titration of diuretic and antihypertensive.  BMP today.  Combined heart failure - LVEF 35-40% in setting of PE. GDMT includes Coreg, Imdur, Hydralazine. GDMT includes Furosemide, Hydralazine. Volume up 9 pounds - BMP/BNP today - increase lasix to '40mg'$  QD x 3 days then return to 20 mg daily with additional tablet as needed for weight gain of 2 pounds overnight or 5 pounds in 1 week.  Add Spironolactone 12.'5mg'$  QD. Plan for echo in 2 months to reassess LVEF. If LVEF not at goal, plan for [facility] and/or SGLT2i at that time.  Low sodium diet, fluid restriction <2L, and daily weights encouraged. Educated to contact our office for weight gain of 2 lbs overnight or 5 lbs in one week.   GERD - Continue to follow with PCP.   Hypothyroidism - Continue to follow with PCP.   DM2 - 07/25/21 A1c 7.3 which was improved compared to previous. Continue to follow with PCP.   Disposition: Follow up  in 3 months with Pixie Casino, MD or APP.  Signed, Loel Dubonnet, NP 10/09/2021, 10:42 AM [facility]

## 2021-10-10 ENCOUNTER — Telehealth (HOSPITAL_BASED_OUTPATIENT_CLINIC_OR_DEPARTMENT_OTHER): Payer: Self-pay

## 2021-10-10 ENCOUNTER — Other Ambulatory Visit: Payer: Self-pay | Admitting: Family Medicine

## 2021-10-10 DIAGNOSIS — I10 Essential (primary) hypertension: Secondary | ICD-10-CM

## 2021-10-10 LAB — BASIC METABOLIC PANEL
BUN/Creatinine Ratio: 18 (ref 10–24)
BUN: 20 mg/dL (ref 8–27)
CO2: 22 mmol/L (ref 20–29)
Calcium: 9.9 mg/dL (ref 8.6–10.2)
Chloride: 105 mmol/L (ref 96–106)
Creatinine, Ser: 1.11 mg/dL (ref 0.76–1.27)
Glucose: 100 mg/dL — ABNORMAL HIGH (ref 70–99)
Potassium: 4.3 mmol/L (ref 3.5–5.2)
Sodium: 144 mmol/L (ref 134–144)
eGFR: 71 mL/min/{1.73_m2} (ref >59)

## 2021-10-10 LAB — BRAIN NATRIURETIC PEPTIDE: BNP: 32.7 pg/mL (ref 0.0–100.0)

## 2021-10-10 NOTE — Telephone Encounter (Addendum)
Seen by patient Nishanth Stefanelli on 10/10/2021  5:07 PM, repeat labs ordered and mailed to patient.    ----- Message from Loel Dubonnet, NP sent at 10/10/2021  4:48 PM EDT ----- Normal kidney function and electrolytes.  BNP with no significant volume overload.  Good result.  Continue medications as discussed in recent clinic visit.  Please ask him to have repeat BMP collected in 2 weeks for monitoring.

## 2021-10-11 ENCOUNTER — Other Ambulatory Visit (HOSPITAL_COMMUNITY): Payer: Self-pay

## 2021-10-11 ENCOUNTER — Other Ambulatory Visit: Payer: Self-pay | Admitting: Family Medicine

## 2021-10-11 MED ORDER — BACLOFEN 20 MG PO TABS: 20.0000 mg | ORAL_TABLET | Freq: Two times a day (BID) | ORAL | 1 refills | Status: DC

## 2021-10-11 MED ORDER — BACLOFEN 20 MG PO TABS
20.0000 mg | ORAL_TABLET | Freq: Two times a day (BID) | ORAL | 1 refills | Status: DC
  Filled 2021-10-11: qty 30, 15d supply, fill #0

## 2021-10-17 DIAGNOSIS — E1142 Type 2 diabetes mellitus with diabetic polyneuropathy: Secondary | ICD-10-CM | POA: Diagnosis not present

## 2021-10-17 DIAGNOSIS — I10 Essential (primary) hypertension: Secondary | ICD-10-CM | POA: Diagnosis not present

## 2021-10-17 DIAGNOSIS — Z794 Long term (current) use of insulin: Secondary | ICD-10-CM | POA: Diagnosis not present

## 2021-10-17 DIAGNOSIS — N529 Male erectile dysfunction, unspecified: Secondary | ICD-10-CM | POA: Diagnosis not present

## 2021-10-17 DIAGNOSIS — Z7984 Long term (current) use of oral hypoglycemic drugs: Secondary | ICD-10-CM | POA: Diagnosis not present

## 2021-10-18 ENCOUNTER — Other Ambulatory Visit: Payer: Self-pay | Admitting: Family Medicine

## 2021-10-26 ENCOUNTER — Encounter: Payer: Self-pay | Admitting: Internal Medicine

## 2021-10-26 NOTE — Telephone Encounter (Signed)
Error

## 2021-10-27 ENCOUNTER — Ambulatory Visit (INDEPENDENT_AMBULATORY_CARE_PROVIDER_SITE_OTHER): Payer: Medicare Other | Admitting: Family

## 2021-10-27 ENCOUNTER — Encounter (HOSPITAL_BASED_OUTPATIENT_CLINIC_OR_DEPARTMENT_OTHER): Payer: Self-pay | Admitting: Family

## 2021-10-27 VITALS — BP 110/74 | HR 100 | Ht 68.0 in | Wt 205.6 lb

## 2021-10-27 DIAGNOSIS — Z86711 Personal history of pulmonary embolism: Secondary | ICD-10-CM | POA: Diagnosis not present

## 2021-10-27 DIAGNOSIS — Z86718 Personal history of other venous thrombosis and embolism: Secondary | ICD-10-CM

## 2021-10-27 DIAGNOSIS — K219 Gastro-esophageal reflux disease without esophagitis: Secondary | ICD-10-CM | POA: Diagnosis not present

## 2021-10-27 DIAGNOSIS — I10 Essential (primary) hypertension: Secondary | ICD-10-CM

## 2021-10-27 DIAGNOSIS — Z794 Long term (current) use of insulin: Secondary | ICD-10-CM

## 2021-10-27 DIAGNOSIS — I2510 Atherosclerotic heart disease of native coronary artery without angina pectoris: Secondary | ICD-10-CM

## 2021-10-27 DIAGNOSIS — N1831 Chronic kidney disease, stage 3a: Secondary | ICD-10-CM | POA: Diagnosis not present

## 2021-10-27 DIAGNOSIS — E1165 Type 2 diabetes mellitus with hyperglycemia: Secondary | ICD-10-CM | POA: Diagnosis not present

## 2021-10-27 DIAGNOSIS — D6859 Other primary thrombophilia: Secondary | ICD-10-CM

## 2021-10-27 DIAGNOSIS — I5042 Chronic combined systolic (congestive) and diastolic (congestive) heart failure: Secondary | ICD-10-CM

## 2021-10-27 MED ORDER — ENTRESTO 24-26 MG PO TABS: 1.0000 | ORAL_TABLET | Freq: Two times a day (BID) | ORAL | 5 refills | Status: DC

## 2021-10-27 MED ORDER — CARVEDILOL 12.5 MG PO TABS: 12.5000 mg | ORAL_TABLET | Freq: Two times a day (BID) | ORAL | 3 refills | Status: DC

## 2021-10-27 NOTE — Patient Instructions (Signed)
Medication Instructions:  Your physician has recommended you make the following change in your medication:   INCREASE Carvedilol to 12.'5mg'$  twice daily  STOP Hydralazine  START Entresto one 24-'26mg'$  tablet twice daily  INCREASE Lasix to '40mg'$  daily for 2 days THEN return to '20mg'$  daily  *If you need a refill on your cardiac medications before your next appointment, please call your pharmacy*   Lab Work: Your physician recommends that you return for lab work [place] for BMP, CBC  Please return for Lab work. You may come to the...   [facility] (3rd floor) [address], [place], [place] [address]  Open: 8am-Noon and 1pm-4:30pm  Please ring the doorbell on the small table when you exit the elevator and the Lab Tech will come get you  [place] [facility] [address] [place], [place], [place] [address] Open: 8am-1pm, then 2pm-4:30pm   [place]- Please see attached locations sheet stapled to your lab work with address and hours.     If you have labs (blood work) drawn today and your tests are completely normal, you will receive your results only by: [place] (if you have MyChart) OR A paper copy in the mail If you have any lab test that is abnormal or we need to change your treatment, we will call you to review the results.   Testing/Procedures: Proceed with echocardiogram as scheduled.   Follow-Up: At [facility], you and your health needs are our priority.  As part of our continuing mission to provide you with exceptional heart care, we have created designated Provider Care Teams.  These Care Teams include your primary Cardiologist (physician) and Advanced Practice Providers (APPs -  Physician Assistants and Nurse Practitioners) who all work together to provide you with the care you need, when you need it.  We recommend signing up for the patient portal called "MyChart".  Sign up information is  provided on this After Visit Summary.  MyChart is used to connect with patients for Virtual Visits (Telemedicine).  Patients are able to view lab/test results, encounter notes, upcoming appointments, etc.  Non-urgent messages can be sent to your provider as well.   To learn more about what you can do with MyChart, go to [email or web address].    Your next appointment:   As scheduled   Other Instructions  You have been referred to psychology today. Mental health is an important part of heart health. You have been referred to Dr. Elias Else. He is located at [facility] care at [facility]. They will contact you to schedule an appointment. If you have not heard from them in 1 week, you may call [phone] to schedule an appointment.    Heart Healthy Diet Recommendations: A low-salt diet is recommended. Meats should be grilled, baked, or boiled. Avoid fried foods. Focus on lean protein sources like fish or chicken with vegetables and fruits. The American Heart Association is a Microbiologist!  American Heart Association Diet and Lifeystyle Recommendations   Exercise recommendations: The American Heart Association recommends 150 minutes of moderate intensity exercise weekly. Try 30 minutes of moderate intensity exercise 4-5 times per week. This could include walking, jogging, or swimming.   Important Information About Sugar

## 2021-10-27 NOTE — Progress Notes (Signed)
Office Visit    Patient Name: [name] Date of Encounter: 10/27/2021  PCP:  [name], [facility]  Cardiologist:  Pixie Casino, MD  Advanced Practice Provider:  No care team member to display Electrophysiologist:  None    Chief Complaint    [name] is a 71 y.o. male with a hx of hypertension, DM2, autoimmune myositis, PTSD, PE, DVT, HFrEF, CKD 3  presents today for shortness of breath  Past Medical History    Past Medical History:  Diagnosis Date   Autoimmune disease ([place])    Bronchitis    Chronic hiccups    Diabetes mellitus without complication (HCC)    GERD (gastroesophageal reflux disease)    Hypothyroidism    Joint pain    Necrotizing myopathy    PTSD (post-traumatic stress disorder)    Past Surgical History:  Procedure Laterality Date   BIOPSY SHOULDER     Left   IR ANGIOGRAM PULMONARY BILATERAL SELECTIVE  07/22/2021   IR THROMBECT VENO MECH MOD SED  07/22/2021   IR US GUIDE VASC ACCESS LEFT  07/22/2021   RADIOLOGY WITH ANESTHESIA Right 07/22/2021   Procedure: IR WITH ANESTHESIA;  Surgeon: Radiologist, Medication, MD;  Location: [facility];  Service: Radiology;  Laterality: Right;   UPPER LEG SOFT TISSUE BIOPSY Right     Allergies  Allergies  Allergen Reactions   Benazepril Cough   Gabapentin Swelling   Hydrocodone    Shellfish Allergy     History of Present Illness    [name] is a 71 y.o. male with a hx of hypertension, DM2, nonobstructive CAD, autoimmune myositis, PTSD, PE, DVT, HFrEF, CKD 3 last seen 10/09/21  He was admitted 07/21/2021 and diagnosed with acute pulmonary embolism as well as acute RLE DVT with acute hypoxic respiratory failure.  This was in the setting of testosterone injections were discontinued.  He was started on heparin and transitioned to Eliquis.  Echo during admission 07/23/2021 with LVEF 35-40%, wall motion abnormalities noted, grade 1 diastolic dysfunction, trivial MR, mild AI, mild  dilation ascending aorta 41 mm.  Due to AKI he was recommended for outpatient ischemic evaluation.  He was seen by PCP 08/09/2021 noting persistent headache and MRI was ordered revealing no evidence of acute intracranial abnormality, mild chronic small vessel ischemic changes within the white matter, mild generalized parenchymal atrophy.  Last seen 08/14/2021.  He noted occasional left-sided chest discomfort described as a pinch breath at rest and with activity.  Swelling was improving.  Cardiac CTA ordered for ischemic evaluation in the setting of new heart failure.  Imdur discontinued due to patient report of headache.  Cardiac CTA 08/28/2021 coronary calcium score of 129 placing him in the 64th percentile for age, gender. LM 1-24% stenosis and ostial/prox LAD mild 25-49% stenosis (FFR 0.88 prox and 0.82 mid - no significant stenosis).   Seen in follow-up 10/09/2021.  Weight was up 9 pounds over the prior 2 months.  Also noted lower extremity edema.  Lasix was increased to 40 mg daily for 3 days then return to 20 mg daily with additional tablet as needed.  Spironolactone 12.5 mg daily added  He presents today for follow-up with his wife. He had two days of shortness of breath at rest earlier in the week which lasted only seconds. No chest pain. Still with some RLE edema which is site of DVT. No missed doses of Eliquis.   EKGs/Labs/Other Studies Reviewed:   The following  studies were reviewed today:  Cardiac CTA 08/28/21 FINDINGS: Non-cardiac: See separate report from [facility].   Pulmonary veins drain normally to the left atrium. No LA appendage thrombus noted.   Calcium Score: 129 Agatston units.   Coronary Arteries: Right dominant with no anomalies   LM: Mixed plaque distal left main, mild (1-24%) stenosis.   LAD system: Mixed plaque ostial/proximal LAD, mild (25-49%) stenosis.   Circumflex system: No plaque or stenosis.   RCA system: No plaque or stenosis.   IMPRESSION: 1.  Coronary artery calcium score 129 Agatston units. This places the patient in the 64th percentile for age and gender, suggesting intermediate risk for future cardiac events.   2. Suspect mild (25-49%) stenosis in the ostial/proximal LAD. Will send for FFR to confirm.  FFR 1. Left Main: No significant stenosis.   2. LAD: 0.88 proximal LAD, 0.82 mid LAD. 3. LCX: No significant stenosis. 4. RCA: No significant stenosis.   IMPRESSION: 1. CT FFR analysis didn't show any significant stenosis in the ostial/proximal LAD.  Echo 07/23/21:  1. Left ventricular ejection fraction, by estimation, is 35 to 40%. Left  ventricular ejection fraction by 2D MOD biplane is 35.4 %. The left  ventricle has moderately decreased function. The left ventricle  demonstrates regional wall motion abnormalities  (see scoring diagram/findings for description). Left ventricular diastolic  parameters are consistent with Grade I diastolic dysfunction (impaired  relaxation). There is severe hypokinesis of the left ventricular, entire  inferoseptal wall, inferior wall,  apical segment and inferolateral wall.   2. Poorly visualized RV, however, based on TAPSE and RsVel, the right  ventricular systolic function is normal.   3. The mitral valve is abnormal. Trivial mitral valve regurgitation.   4. The aortic valve is tricuspid. Aortic valve regurgitation is mild.  Aortic valve sclerosis is present, with no evidence of aortic valve  stenosis.   5. Aortic dilatation noted. There is mild dilatation of the ascending  aorta, measuring 41 mm.   6. The inferior vena cava is normal in size with greater than 50%  respiratory variability, suggesting right atrial pressure of 3 mmHg.   EKG: No EKG today  Recent Labs: 07/26/2021: Magnesium 1.9 08/09/2021: ALT 24; Hemoglobin 13.3; Platelets 234 10/09/2021: BNP 32.7; BUN 20; Creatinine, Ser 1.11; Potassium 4.3; Sodium 144  Recent Lipid Panel    Component Value Date/Time   CHOL  182 07/24/2021 0022   TRIG 132 07/24/2021 0022   HDL 41 07/24/2021 0022   CHOLHDL 4.4 07/24/2021 0022   VLDL 26 07/24/2021 0022   LDLCALC 115 (H) 07/24/2021 0022   LDLCALC 131 (H) 09/22/2019 0858   LDLDIRECT 72.0 04/01/2018 1035    Home Medications   Current Meds  Medication Sig   acarbose (PRECOSE) 100 MG tablet Take 100 mg by mouth 3 (three) times daily with meals.   apixaban (ELIQUIS) 5 MG TABS tablet Take 1 tablet (5 mg total) by mouth 2 (two) times daily.   atorvastatin (LIPITOR) 80 MG tablet Take 1 tablet (80 mg total) by mouth at bedtime.   baclofen (LIORESAL) 20 MG tablet Take 1 tablet (20 mg total) by mouth 2 (two) times daily.   calcium carbonate (OS-CAL) 600 MG TABS tablet Take 600 mg by mouth 2 (two) times daily with a meal.   carvedilol (COREG) 12.5 MG tablet Take 1 tablet (12.5 mg total) by mouth 2 (two) times daily.   folic acid (FOLVITE) 1 MG tablet Take 1 mg by mouth daily.   furosemide (LASIX)  20 MG tablet Take 1 tablet (20 mg total) by mouth daily. May take additional tablet ('20mg'$ ) as needed for weight gain of 2 pounds overnight or 5 pounds in 1 week.   glipiZIDE (GLUCOTROL XL) 10 MG 24 hr tablet Take 10 mg by mouth 2 (two) times daily.    hydrOXYzine (ATARAX/VISTARIL) 25 MG tablet TAKE 1 TABLET (25 MG TOTAL) BY MOUTH 3 (THREE) TIMES DAILY AS NEEDED. FOR ITCHING   insulin glargine (LANTUS) 100 UNIT/ML injection Inject 0.46-0.56 mLs (46-56 Units total) into the skin at bedtime. For now take 30 units twice a day and then increase to your usual dose as your oral intake improves. Increase both doses by 2units every day. Check your blood sugars at home three times daily. Call your doctor if sugar level is more than 300 or less than 80. (Patient taking differently: Inject 42-44 Units into the skin 2 (two) times daily. Check your blood sugars at home three times daily. Call your doctor if sugar level is more than 300 or less than 80.)   Levothyroxine Sodium 88 MCG CAPS Take 88  mcg by mouth daily.   metFORMIN (GLUCOPHAGE-XR) 500 MG 24 hr tablet Take 2,000 mg by mouth daily.   methotrexate (RHEUMATREX) 5 MG tablet Take 2 tablets (10 mg total) by mouth once a week. Caution: Chemotherapy. Protect from light. Take on fridays per patient.  OKAY TO RESUME FROM 10/08/14. (Patient taking differently: Take 30 mg by mouth once a week. Caution: Chemotherapy. Protect from light. Take on fridays per patient. 6 Tabs once a week)   nystatin (MYCOSTATIN/NYSTOP) powder SMARTSIG:1 Application Topical 2-3 Times Daily   pantoprazole (PROTONIX) 40 MG tablet TAKE 1 TABLET BY MOUTH EVERY DAY   polyvinyl alcohol (LIQUIFILM TEARS) 1.4 % ophthalmic solution 1 drop as needed for dry eyes.   QUEtiapine (SEROQUEL) 25 MG tablet Take 1 tablet (25 mg total) by mouth at bedtime.   sacubitril-valsartan (ENTRESTO) 24-26 MG Take 1 tablet by mouth 2 (two) times daily.   sertraline (ZOLOFT) 100 MG tablet Take 100 mg by mouth daily.   spironolactone (ALDACTONE) 25 MG tablet Take 0.5 tablets (12.5 mg total) by mouth daily.   traMADol (ULTRAM) 50 MG tablet Take 100 mg by mouth 3 (three) times daily as needed for moderate pain.   triamcinolone cream (KENALOG) 0.1 % Use 1 application twice daily as needed to red itchy areas below the face   [DISCONTINUED] carvedilol (COREG) 6.25 MG tablet Take 1 tablet (6.25 mg total) by mouth 2 (two) times daily with a meal.   [DISCONTINUED] hydrALAZINE (APRESOLINE) 25 MG tablet Take 1 tablet (25 mg total) by mouth in the morning and at bedtime.     Review of Systems     All other systems reviewed and are otherwise negative except as noted above.  Physical Exam    VS:  BP 110/74   Pulse 100   Ht '5\' 8"'$  (1.727 m)   Wt 205 lb 9.6 oz (93.3 kg)   BMI 31.26 kg/m  , BMI Body mass index is 31.26 kg/m.  Wt Readings from Last 3 Encounters:  10/27/21 205 lb 9.6 oz (93.3 kg)  10/09/21 202 lb (91.6 kg)  08/14/21 193 lb 12.8 oz (87.9 kg)    GEN: Well nourished, overweight,  well developed, in no acute distress. HEENT: normal. Neck: Supple, no JVD, carotid bruits, or masses. Cardiac: RRR, no murmurs, rubs, or gallops. No clubbing, cyanosis. RLE 1+ pitting edema. Radials/PT 2+ and equal bilaterally.  Respiratory:  Respirations regular and unlabored, clear to auscultation bilaterally. GI: Soft, nontender, nondistended. MS: No deformity or atrophy. Skin: Warm and dry, no rash. Neuro:  Strength and sensation are intact. Psych: Normal affect.  Assessment & Plan    PE / Hx of DVT - In setting of testosterone injections, admission 06/2021 with acute saddle PE with right heart strain and right popliteal s/p thrombectomy 07/22/21.  Continue Eliquis 5 mg twice daily.  Will require lifelong anticoagulation.  Does not meet dose reduction criteria.  Denies bleeding complications.  Refill of Eliquis provided.  Future refills may come from our office or primary care.  HTN - Goal BP <130/80.  BP at goal today. Stop Hydralazine to allow room for added Entresto. Increase Coreg for optimization of HF therapies.    Home monitoring encouraged.  Anxiety - Encouraged to discuss with primary care provider regarding his anxiolytics. Referred to Dr. Conception Chancy, PsyD.   Nonobstructive CAD - Cardiac CTA 08/28/21 moderate nonobstructive disease. No anginal symptoms. Continue Coreg, Atorvastatin. No ASA due to [facility]. Heart healthy diet and regular cardiovascular exercise encouraged.    CKD3a - 09/05/21 creatinine 1.14, GFR 68.  Improved since most recent admission to his baseline. Careful titration of diuretic and antihypertensive.    Combined heart failure - LVEF 35-40% in setting of PE. GDMT includes Coreg, Imdur, Hydralazine. GDMT includes Furosemide. Due to some shortness of breath consistent with HF symptoms - stop Hydralazine, start Entresto 24-'15mg'$  BID, increase Coreg to 12.'5mg'$  BID. He will take additional Lasix for 2 days the return to '20mg'$  QD dosing. Repeat echo as scheduled. Refer to  cardiac rehab.  If LVEF not at goal, plan for  SGLT2i at that time.  Low sodium diet, fluid restriction <2L, and daily weights encouraged. Educated to contact our office for weight gain of 2 lbs overnight or 5 lbs in one week.   GERD - Continue to follow with PCP.   Hypothyroidism - Continue to follow with PCP.   DM2 - 07/25/21 A1c 7.3 which was improved compared to previous. Continue to follow with PCP.   Disposition: Follow up in September as scheduled with Pixie Casino, MD or APP.  Signed, Loel Dubonnet, NP 10/27/2021, 4:19 PM [facility]

## 2021-11-03 NOTE — Addendum Note (Signed)
Addended by: Loel Dubonnet on: 11/03/2021 10:02 AM   Modules accepted: Orders

## 2021-11-06 ENCOUNTER — Encounter (HOSPITAL_BASED_OUTPATIENT_CLINIC_OR_DEPARTMENT_OTHER): Payer: Self-pay

## 2021-11-06 NOTE — Telephone Encounter (Signed)
Entresto Prior [employer] on Cover My Meds;  Key: [id number]  Awaiting Determination.

## 2021-11-06 NOTE — Telephone Encounter (Signed)
Per cover my meds "Your PA has been resolved, no additional PA is required" patient coming to get patient assistance paperwork.

## 2021-11-14 ENCOUNTER — Encounter: Payer: Self-pay | Admitting: Family Medicine

## 2021-11-14 ENCOUNTER — Ambulatory Visit (INDEPENDENT_AMBULATORY_CARE_PROVIDER_SITE_OTHER): Payer: Medicare Other | Admitting: Family Medicine

## 2021-11-14 ENCOUNTER — Ambulatory Visit (INDEPENDENT_AMBULATORY_CARE_PROVIDER_SITE_OTHER): Payer: Medicare Other

## 2021-11-14 VITALS — BP 117/62 | HR 86 | Ht 68.0 in | Wt 210.0 lb

## 2021-11-14 DIAGNOSIS — Z794 Long term (current) use of insulin: Secondary | ICD-10-CM

## 2021-11-14 DIAGNOSIS — I5041 Acute combined systolic (congestive) and diastolic (congestive) heart failure: Secondary | ICD-10-CM

## 2021-11-14 DIAGNOSIS — I5042 Chronic combined systolic (congestive) and diastolic (congestive) heart failure: Secondary | ICD-10-CM | POA: Diagnosis not present

## 2021-11-14 DIAGNOSIS — I10 Essential (primary) hypertension: Secondary | ICD-10-CM

## 2021-11-14 DIAGNOSIS — M7989 Other specified soft tissue disorders: Secondary | ICD-10-CM | POA: Diagnosis not present

## 2021-11-14 DIAGNOSIS — M25473 Effusion, unspecified ankle: Secondary | ICD-10-CM

## 2021-11-14 DIAGNOSIS — E1165 Type 2 diabetes mellitus with hyperglycemia: Secondary | ICD-10-CM

## 2021-11-14 MED ORDER — FREESTYLE LIBRE 2 SENSOR MISC: 3 refills | Status: DC

## 2021-11-14 MED ORDER — ENTRESTO 24-26 MG PO TABS: 1.0000 | ORAL_TABLET | Freq: Two times a day (BID) | ORAL | 5 refills | Status: DC

## 2021-11-14 NOTE — Assessment & Plan Note (Signed)
Management per cardiology.  Currently on Entresto however having problems with affordability of medication.  Printed new prescription off for him to take to the [place], hopefully can get this filled through the pharmacy there.

## 2021-11-14 NOTE — Assessment & Plan Note (Signed)
Blood pressure stable at this time.  He will continue current antihypertensives.

## 2021-11-14 NOTE — Assessment & Plan Note (Addendum)
Likely due to combination of CHF and venous insufficiency.  Question possible May- Thurner syndrome from prior DVT.  Continue use of compression stockings and Lasix at current strength.  Keep legs elevated at rest.  Updated ultrasound ordered as he is having some thigh pain.

## 2021-11-14 NOTE — Patient Instructions (Addendum)
Continue lasix at increased dose.  Continue compression stockings.  We'll work on getting [employer].  Talk to [facility] about Entresto.

## 2021-11-14 NOTE — Progress Notes (Signed)
[name] - 71 y.o. male MRN [id number]  Date of birth: [birth year]  Subjective Chief Complaint  Patient presents with   Follow-up    HPI [name] is a 71 year old male here today for follow-up visit.  Since his last visit he was seen by cardiology.  Started on [place] for CHF.  He is tolerating this well but concerned about price.  Costing approximate $300 a month.  He has not checked on getting this through the [place].  It was recommended he apply through patient assistance however income is too much for him to qualify for this.  He has had continued lower extremity swelling.  His Lasix was recently increased.  He is having thigh pain bilaterally right greater than left.  Does have history of DVT and pulmonary embolism.  He reports he is compliant with Eliquis.  He denies new shortness of breath or dyspnea.  He does continue to see endocrinology for management of his diabetes.  Overall doing well at this time.  He is interested in a continuous glucose monitor.  He is on Lantus as well as glipizide.  He has had some low blood sugars in the past.  He has had some anxiety and is using Seroquel at night along with Zoloft.  He has been referred for counseling.  ROS:  A comprehensive ROS was completed and negative except as noted per HPI  Allergies  Allergen Reactions   Benazepril Cough   Gabapentin Swelling   Hydrocodone    Shellfish Allergy     Past Medical History:  Diagnosis Date   Autoimmune disease ([place])    Bronchitis    Chronic hiccups    Diabetes mellitus without complication (HCC)    GERD (gastroesophageal reflux disease)    Hypothyroidism    Joint pain    Necrotizing myopathy    PTSD (post-traumatic stress disorder)     Past Surgical History:  Procedure Laterality Date   BIOPSY SHOULDER     Left   IR ANGIOGRAM PULMONARY BILATERAL SELECTIVE  07/22/2021   IR THROMBECT VENO MECH MOD SED  07/22/2021   IR US GUIDE VASC ACCESS LEFT  07/22/2021   RADIOLOGY WITH ANESTHESIA Right  07/22/2021   Procedure: IR WITH ANESTHESIA;  Surgeon: Radiologist, Medication, MD;  Location: [place];  Service: Radiology;  Laterality: Right;   UPPER LEG SOFT TISSUE BIOPSY Right     Social History   Socioeconomic History   Marital status: Married    Spouse name: Not on file   Number of children: 4   Years of education: 16   Highest education level: Not on file  Occupational History   Occupation: Retired  Tobacco Use   Smoking status: Never   Smokeless tobacco: Never  Vaping Use   Vaping Use: Never used  Substance and Sexual Activity   Alcohol use: No   Drug use: No   Sexual activity: Not on file  Other Topics Concern   Not on file  Social History Narrative   Lives at home with his wife and son.   Right-handed.   2 cups caffeine per day.   Social Determinants of Health   Financial Resource Strain: Not on file  Food Insecurity: Not on file  Transportation Needs: Not on file  Physical Activity: Not on file  Stress: Not on file  Social Connections: Not on file    Family History  Problem Relation Age of Onset   Hypertension Mother    Diabetes Mother    Arthritis Mother  Depression Mother    Heart attack Father    Hypertension Father     Health Maintenance  Topic Date Due   Diabetic kidney evaluation - Urine ACR  02/14/2022 (Originally 03/15/2019)   COVID-19 Vaccine (6 - Booster for Moderna series) 02/14/2022 (Originally 07/02/2020)   Zoster Vaccines- Shingrix (1 of 2) 02/14/2022 (Originally [birth year]/1967)   COLONOSCOPY (Pts 45-51yr Insurance coverage will need to be confirmed)  08/10/2022 (Originally 10/28/1993)   TETANUS/TDAP  11/15/2022 (Originally 08/19/2021)   INFLUENZA VACCINE  11/28/2021   HEMOGLOBIN A1C  01/25/2022   OPHTHALMOLOGY EXAM  03/09/2022   Diabetic kidney evaluation - GFR measurement  10/10/2022   FOOT EXAM  11/15/2022   Pneumonia Vaccine 71 Years old  Completed   Hepatitis C Screening  Completed   HPV VACCINES  Aged Out      ----------------------------------------------------------------------------------------------------------------------------------------------------------------------------------------------------------------- Physical Exam BP 117/62 (BP Location: Right Arm, Patient Position: Sitting, Cuff Size: Normal)   Pulse 86   Ht '5\' 8"'$  (1.727 m)   Wt 210 lb (95.3 kg)   SpO2 99%   BMI 31.93 kg/m   Physical Exam Constitutional:      Appearance: Normal appearance.  Eyes:     General: No scleral icterus. Cardiovascular:     Rate and Rhythm: Normal rate and regular rhythm.  Pulmonary:     Effort: Pulmonary effort is normal.     Breath sounds: Normal breath sounds.  Musculoskeletal:     Cervical back: Neck supple.     Right lower leg: Edema present.     Left lower leg: Edema present.  Neurological:     General: No focal deficit present.     Mental Status: He is alert.  Psychiatric:        Mood and Affect: Mood normal.        Behavior: Behavior normal.     ------------------------------------------------------------------------------------------------------------------------------------------------------------------------------------------------------------------- Assessment and Plan  Benign essential HTN Blood pressure stable at this time.  He will continue current antihypertensives.  Acute combined systolic and diastolic heart failure ([place] [facility] per cardiology.  Currently on Entresto however having problems with affordability of medication.  [place].  DM2 (diabetes mellitus, type 2) ([place] [facility] per endocrinology.  Stable at this time.  He has had some fluctuating blood sugars with episodes of hypoglycemia.  Currently on insulin as well as sulfonylurea.  Sending prescription for freestyle libre to to DME company.  Ankle swelling Likely due to combination of CHF and  venous insufficiency.  Question possible May- Thurner syndrome from prior DVT.  Continue use of compression stockings and Lasix at current strength.  Keep legs elevated at rest.  Updated ultrasound ordered as he is having some thigh pain.   Meds ordered this encounter  Medications   Continuous Blood Gluc Sensor (FREESTYLE LIBRE 2 SENSOR) MISC    Sig: Use to check glucose as needed.  Change every 14 days.    Dispense:  2 each    Refill:  3   sacubitril-valsartan (ENTRESTO) 24-26 MG    Sig: Take 1 tablet by mouth 2 (two) times daily.    Dispense:  60 tablet    Refill:  5    Return in about 6 months (around 05/17/2022) for HTN/Edema.    This visit occurred during the SARS-CoV-2 public health emergency.  Safety protocols were in place, including screening questions prior to the visit, additional usage of staff PPE, and extensive  cleaning of exam room while observing appropriate contact time as indicated for disinfecting solutions.

## 2021-11-14 NOTE — Assessment & Plan Note (Signed)
Management per endocrinology.  Stable at this time.  He has had some fluctuating blood sugars with episodes of hypoglycemia.  Currently on insulin as well as sulfonylurea.  Sending prescription for freestyle libre to to DME company.

## 2021-12-05 ENCOUNTER — Encounter (HOSPITAL_COMMUNITY): Payer: Self-pay

## 2021-12-11 ENCOUNTER — Other Ambulatory Visit (HOSPITAL_BASED_OUTPATIENT_CLINIC_OR_DEPARTMENT_OTHER): Payer: Medicare Other

## 2021-12-20 ENCOUNTER — Other Ambulatory Visit: Payer: Self-pay | Admitting: Allergy

## 2021-12-20 ENCOUNTER — Other Ambulatory Visit: Payer: Self-pay | Admitting: Family Medicine

## 2021-12-20 ENCOUNTER — Encounter: Payer: Self-pay | Admitting: General Practice

## 2021-12-20 ENCOUNTER — Other Ambulatory Visit (HOSPITAL_BASED_OUTPATIENT_CLINIC_OR_DEPARTMENT_OTHER): Payer: Self-pay

## 2021-12-20 ENCOUNTER — Ambulatory Visit (INDEPENDENT_AMBULATORY_CARE_PROVIDER_SITE_OTHER): Payer: Medicare Other

## 2021-12-20 DIAGNOSIS — I5042 Chronic combined systolic (congestive) and diastolic (congestive) heart failure: Secondary | ICD-10-CM

## 2021-12-20 DIAGNOSIS — Z86711 Personal history of pulmonary embolism: Secondary | ICD-10-CM

## 2021-12-20 DIAGNOSIS — I77810 Thoracic aortic ectasia: Secondary | ICD-10-CM

## 2021-12-20 DIAGNOSIS — I10 Essential (primary) hypertension: Secondary | ICD-10-CM | POA: Diagnosis not present

## 2021-12-20 LAB — ECHOCARDIOGRAM COMPLETE
Area-P 1/2: 3.85 cm2
S' Lateral: 2.72 cm

## 2021-12-22 NOTE — Telephone Encounter (Signed)
Historical provider. Last visit 11/14/2021.   This request came through MyChart from the patient.

## 2022-01-09 ENCOUNTER — Ambulatory Visit (INDEPENDENT_AMBULATORY_CARE_PROVIDER_SITE_OTHER): Payer: Medicare Other | Admitting: Family

## 2022-01-09 ENCOUNTER — Encounter (HOSPITAL_BASED_OUTPATIENT_CLINIC_OR_DEPARTMENT_OTHER): Payer: Self-pay | Admitting: Family

## 2022-01-09 VITALS — BP 104/50 | HR 86 | Ht 68.0 in | Wt 210.3 lb

## 2022-01-09 DIAGNOSIS — G4730 Sleep apnea, unspecified: Secondary | ICD-10-CM

## 2022-01-09 DIAGNOSIS — I5042 Chronic combined systolic (congestive) and diastolic (congestive) heart failure: Secondary | ICD-10-CM

## 2022-01-09 DIAGNOSIS — I739 Peripheral vascular disease, unspecified: Secondary | ICD-10-CM

## 2022-01-09 DIAGNOSIS — M25561 Pain in right knee: Secondary | ICD-10-CM | POA: Diagnosis not present

## 2022-01-09 DIAGNOSIS — R0683 Snoring: Secondary | ICD-10-CM | POA: Diagnosis not present

## 2022-01-09 DIAGNOSIS — M25562 Pain in left knee: Secondary | ICD-10-CM

## 2022-01-09 DIAGNOSIS — M25511 Pain in right shoulder: Secondary | ICD-10-CM

## 2022-01-09 MED ORDER — ENTRESTO 24-26 MG PO TABS: 0.5000 | ORAL_TABLET | Freq: Two times a day (BID) | ORAL | 5 refills | Status: DC

## 2022-01-09 MED ORDER — FUROSEMIDE 20 MG PO TABS: 20.0000 mg | ORAL_TABLET | ORAL | 1 refills | Status: DC | PRN

## 2022-01-09 MED ORDER — PANTOPRAZOLE SODIUM 40 MG PO TBEC: 40.0000 mg | DELAYED_RELEASE_TABLET | ORAL | 3 refills | Status: DC | PRN

## 2022-01-09 NOTE — Progress Notes (Signed)
Office Visit    Patient Name: [name] Date of Encounter: 01/11/2022  PCP:  [name], [facility]  Cardiologist:  Pixie Casino, MD  Advanced Practice Provider:  No care team member to display Electrophysiologist:  None    Chief Complaint    [name] is a 71 y.o. male with a hx of hypertension, DM2, autoimmune myositis, PTSD, PE, DVT, HFrEF, CKD 3  presents today for follow-up after echocardiogram Past Medical History    Past Medical History:  Diagnosis Date   Autoimmune disease ([place])    Bronchitis    Chronic hiccups    Diabetes mellitus without complication (HCC)    GERD (gastroesophageal reflux disease)    Hypothyroidism    Joint pain    Necrotizing myopathy    PTSD (post-traumatic stress disorder)    Past Surgical History:  Procedure Laterality Date   BIOPSY SHOULDER     Left   IR ANGIOGRAM PULMONARY BILATERAL SELECTIVE  07/22/2021   IR THROMBECT VENO MECH MOD SED  07/22/2021   IR US GUIDE VASC ACCESS LEFT  07/22/2021   RADIOLOGY WITH ANESTHESIA Right 07/22/2021   Procedure: IR WITH ANESTHESIA;  Surgeon: Radiologist, Medication, MD;  Location: [place];  Service: Radiology;  Laterality: Right;   UPPER LEG SOFT TISSUE BIOPSY Right     Allergies  Allergies  Allergen Reactions   Benazepril Cough   Gabapentin Swelling   Hydrocodone    Shellfish Allergy     History of Present Illness    [name] is a 71 y.o. male with a hx of hypertension, DM2, nonobstructive CAD, autoimmune myositis, PTSD, PE, DVT, HFrEF, CKD 3 last seen 10/27/21  He was admitted 07/21/2021 and diagnosed with acute pulmonary embolism as well as acute RLE DVT with acute hypoxic respiratory failure.  This was in the setting of testosterone injections were discontinued.  He was started on heparin and transitioned to Eliquis.  Echo during admission 07/23/2021 with LVEF 35-40%, wall motion abnormalities noted, grade 1 diastolic dysfunction, trivial MR, mild AI,  mild dilation ascending aorta 41 mm.  Due to AKI he was recommended for outpatient ischemic evaluation.  He was seen by PCP 08/09/2021 noting persistent headache and MRI was ordered revealing no evidence of acute intracranial abnormality, mild chronic small vessel ischemic changes within the white matter, mild generalized parenchymal atrophy.  Seen 08/14/2021. Cardiac CTA ordered for ischemic evaluation.  Imdur discontinued due to patient report of headache.  Cardiac CTA 08/28/2021 coronary calcium score of 129 placing him in the 64th percentile for age, gender. LM 1-24% stenosis and ostial/prox LAD mild 25-49% stenosis (FFR 0.88 prox and 0.82 mid - no significant stenosis).   Seen in follow-up 10/09/2021.  Lasix was increased to 40 mg daily for 3 days then return to 20 mg daily with additional tablet as needed due to edema.  Spironolactone 12.5 mg daily added  Seen 10/27/21 and transitioned from Hydralazine to Entresto. Repeat echo 12/20/21 LVEF 55-60%, stable wall motion abnormalities, gr1DD.   He presents today for follow-up with his wife. His 44 year old son is a Paramedic at [employer] with goal of being an MD. Weight up 5 pounds since last visit 2 months ago.  No lower extremity edema.  Occasional lightheadedness first thing in the morning.  Has not been monitoring blood pressure at home.  Blood sugar running higher which he attributes to snacking.  Notes bilateral knee pain over the last month which is worsening.  Also  with some right shoulder pain.  Some relief with Tylenol.  Has not been exercising on his bike recently and encouraged to resume.  Pain occurs both at rest or with activity.  Describes last night waking up feeling choking with sensation of burning in his throat and nose. He is compliant with his Protonix. But did eat late last night and we discussed this likely contributory to reflux symptoms.  EKGs/Labs/Other Studies Reviewed:   The following studies were reviewed today: Echo 11/2021   1.  Left ventricular ejection fraction, by estimation, is 55 to 60%. The  left ventricle has normal function. The left ventricle demonstrates  regional wall motion abnormalities (see scoring diagram/findings for  description). Left ventricular diastolic  parameters are consistent with Grade I diastolic dysfunction (impaired  relaxation). There is mild hypokinesis of the left ventricular, apical  septal wall.   2. Right ventricular systolic function is normal. The right ventricular  size is normal.   3. The mitral valve is normal in structure. No evidence of mitral valve  regurgitation. No evidence of mitral stenosis.   4. The aortic valve is tricuspid. There is mild calcification of the  aortic valve. Aortic valve regurgitation is not visualized. Aortic valve  sclerosis/calcification is present, without any evidence of aortic  stenosis.   5. There is mild dilatation of the ascending aorta, measuring 42 mm.   6. The inferior vena cava is normal in size with greater than 50%  respiratory variability, suggesting right atrial pressure of 3 mmHg.   Comparison(s): The left ventricular function has improved. The left  ventricular wall motion improved.   Cardiac CTA 08/28/21 FINDINGS: Non-cardiac: See separate report from [facility].   Pulmonary veins drain normally to the left atrium. No LA appendage thrombus noted.   Calcium Score: 129 Agatston units.   Coronary Arteries: Right dominant with no anomalies   LM: Mixed plaque distal left main, mild (1-24%) stenosis.   LAD system: Mixed plaque ostial/proximal LAD, mild (25-49%) stenosis.   Circumflex system: No plaque or stenosis.   RCA system: No plaque or stenosis.   IMPRESSION: 1. Coronary artery calcium score 129 Agatston units. This places the patient in the 64th percentile for age and gender, suggesting intermediate risk for future cardiac events.   2. Suspect mild (25-49%) stenosis in the ostial/proximal LAD.  Will send for FFR to confirm.  FFR 1. Left Main: No significant stenosis.   2. LAD: 0.88 proximal LAD, 0.82 mid LAD. 3. LCX: No significant stenosis. 4. RCA: No significant stenosis.   IMPRESSION: 1. CT FFR analysis didn't show any significant stenosis in the ostial/proximal LAD.  Echo 07/23/21:  1. Left ventricular ejection fraction, by estimation, is 35 to 40%. Left  ventricular ejection fraction by 2D MOD biplane is 35.4 %. The left  ventricle has moderately decreased function. The left ventricle  demonstrates regional wall motion abnormalities  (see scoring diagram/findings for description). Left ventricular diastolic  parameters are consistent with Grade I diastolic dysfunction (impaired  relaxation). There is severe hypokinesis of the left ventricular, entire  inferoseptal wall, inferior wall,  apical segment and inferolateral wall.   2. Poorly visualized RV, however, based on TAPSE and RsVel, the right  ventricular systolic function is normal.   3. The mitral valve is abnormal. Trivial mitral valve regurgitation.   4. The aortic valve is tricuspid. Aortic valve regurgitation is mild.  Aortic valve sclerosis is present, with no evidence of aortic valve  stenosis.   5. Aortic dilatation noted.  There is mild dilatation of the ascending  aorta, measuring 41 mm.   6. The inferior vena cava is normal in size with greater than 50%  respiratory variability, suggesting right atrial pressure of 3 mmHg.   EKG: EKG ordered today. EKG performed today demonstrates NSR 86 bpm with LVH and no acute ST/T wave changes.   Recent Labs: 07/26/2021: Magnesium 1.9 08/09/2021: ALT 24 10/09/2021: BNP 32.7 01/09/2022: BUN 26; Creatinine, Ser 1.35; Hemoglobin 10.6; Platelets 165; Potassium 4.9; Sodium 144  Recent Lipid Panel    Component Value Date/Time   CHOL 182 07/24/2021 0022   TRIG 132 07/24/2021 0022   HDL 41 07/24/2021 0022   CHOLHDL 4.4 07/24/2021 0022   VLDL 26 07/24/2021 0022    LDLCALC 115 (H) 07/24/2021 0022   LDLCALC 131 (H) 09/22/2019 0858   LDLDIRECT 72.0 04/01/2018 1035    Home Medications   Current Meds  Medication Sig   acarbose (PRECOSE) 100 MG tablet Take 100 mg by mouth 3 (three) times daily with meals.   apixaban (ELIQUIS) 5 MG TABS tablet Take 1 tablet (5 mg total) by mouth 2 (two) times daily.   atorvastatin (LIPITOR) 80 MG tablet Take 1 tablet (80 mg total) by mouth at bedtime.   baclofen (LIORESAL) 20 MG tablet Take 1 tablet (20 mg total) by mouth 2 (two) times daily.   calcium carbonate (OS-CAL) 600 MG TABS tablet Take 600 mg by mouth daily.   carvedilol (COREG) 12.5 MG tablet Take 1 tablet (12.5 mg total) by mouth 2 (two) times daily.   Continuous Blood Gluc Sensor (FREESTYLE LIBRE 2 SENSOR) MISC Use to check glucose as needed.  Change every 14 days.   folic acid (FOLVITE) 1 MG tablet Take 1 mg by mouth daily.   glipiZIDE (GLUCOTROL XL) 10 MG 24 hr tablet Take 10 mg by mouth 2 (two) times daily.    insulin glargine (LANTUS) 100 UNIT/ML injection Inject 0.46-0.56 mLs (46-56 Units total) into the skin at bedtime. For now take 30 units twice a day and then increase to your usual dose as your oral intake improves. Increase both doses by 2units every day. Check your blood sugars at home three times daily. Call your doctor if sugar level is more than 300 or less than 80. (Patient taking differently: Inject 42-44 Units into the skin 2 (two) times daily. Check your blood sugars at home three times daily. Call your doctor if sugar level is more than 300 or less than 80.)   Levothyroxine Sodium 88 MCG CAPS Take 88 mcg by mouth daily.   loratadine (CLARITIN) 10 MG tablet daily.   metFORMIN (GLUCOPHAGE-XR) 500 MG 24 hr tablet Take 1,500 mg by mouth daily.   methotrexate (RHEUMATREX) 5 MG tablet Take 2 tablets (10 mg total) by mouth once a week. Caution: Chemotherapy. Protect from light. Take on fridays per patient.  OKAY TO RESUME FROM 10/08/14. (Patient taking  differently: Take 30 mg by mouth once a week. Caution: Chemotherapy. Protect from light. Take on fridays per patient.  6 Tabs once a week)   nystatin (MYCOSTATIN/NYSTOP) powder SMARTSIG:1 Application Topical 2-3 Times Daily   polyvinyl alcohol (LIQUIFILM TEARS) 1.4 % ophthalmic solution 1 drop as needed for dry eyes.   predniSONE (DELTASONE) 5 MG tablet Take 15 mg by mouth daily.   QUEtiapine (SEROQUEL) 25 MG tablet Take 1 tablet (25 mg total) by mouth at bedtime. (Patient taking differently: Take 50 mg by mouth at bedtime.)   sertraline (ZOLOFT) 100 MG tablet  Take 100 mg by mouth daily.   spironolactone (ALDACTONE) 25 MG tablet Take 0.5 tablets (12.5 mg total) by mouth daily.   traMADol (ULTRAM) 50 MG tablet Take 100 mg by mouth 3 (three) times daily as needed for moderate pain.   triamcinolone cream (KENALOG) 0.1 % Use 1 application twice daily as needed to red itchy areas below the face   [DISCONTINUED] furosemide (LASIX) 20 MG tablet Take 1 tablet (20 mg total) by mouth daily. May take additional tablet ('20mg'$ ) as needed for weight gain of 2 pounds overnight or 5 pounds in 1 week.   [DISCONTINUED] pantoprazole (PROTONIX) 40 MG tablet TAKE 1 TABLET BY MOUTH EVERY DAY   [DISCONTINUED] sacubitril-valsartan (ENTRESTO) 24-26 MG Take 1 tablet by mouth 2 (two) times daily.     Review of Systems     All other systems reviewed and are otherwise negative except as noted above.  Physical Exam    VS:  BP (!) 104/50   Pulse 86   Ht '5\' 8"'$  (1.727 m)   Wt 210 lb 4.8 oz (95.4 kg)   SpO2 97%   BMI 31.98 kg/m  , BMI Body mass index is 31.98 kg/m.  Wt Readings from Last 3 Encounters:  01/09/22 210 lb 4.8 oz (95.4 kg)  11/14/21 210 lb (95.3 kg)  10/27/21 205 lb 9.6 oz (93.3 kg)    GEN: Well nourished, overweight, well developed, in no acute distress. HEENT: normal. Neck: Supple, no JVD, carotid bruits, or masses. Cardiac: RRR, no murmurs, rubs, or gallops. No clubbing, cyanosis. RLE 1+ pitting  edema. Radials/PT 2+ and equal bilaterally.  Respiratory:  Respirations regular and unlabored, clear to auscultation bilaterally. GI: Soft, nontender, nondistended. MS: No deformity or atrophy. Skin: Warm and dry, no rash. Neuro:  Strength and sensation are intact. Psych: Normal affect.  Assessment & Plan    Bilateral knee pain/shoulder pain-1 month history of bilateral knee pain right greater than left as well as right shoulder pain.  Has not exercised in the last month.  Symptoms occur at rest or with activity.  Some relief with Tylenol.  Request referral to orthopedics which was placed today.  We will plan for ABIs to rule out claudication as contributory to symptoms.    PE / Hx of DVT - In setting of testosterone injections, admission 06/2021 with acute saddle PE with right heart strain and right popliteal s/p thrombectomy 07/22/21.  Continue Eliquis 5 mg twice daily.  Will require lifelong anticoagulation.  Does not meet dose reduction criteria.  Denies bleeding complications.  Refill of Eliquis provided.  Future refills may come from our office or primary care.  HTN - Goal BP <130/80.  BP now with relative hypotension with occasional lightheadedness.  Reduce Lasix to as needed.  Reduce Entresto to half tablet twice daily.  Home monitoring encouraged.  Nonobstructive CAD - Cardiac CTA 08/28/21 moderate nonobstructive disease. No anginal symptoms. Continue Coreg, Atorvastatin. No ASA due to [facility]. Heart healthy diet and regular cardiovascular exercise encouraged.    CKD3a - 09/05/21 creatinine 1.14, GFR 68.  Improved since most recent admission to his baseline. Careful titration of diuretic and antihypertensive.  Update BMP today.  Combined heart failure - LVEF 35-40% in setting of PE. 11/2021 repeat echo with LVEF returned to normal 55-60%. GDMT includes Coreg, Imdur, Entresto, Furosemide.  As LVEF normalized we will reduce Lasix to only as needed.  Due to relative hypotension reduce Entresto to half  tablet twice daily.  Low sodium diet, fluid restriction <  2L, and daily weights encouraged. Educated to contact our office for weight gain of 2 lbs overnight or 5 lbs in one week.   GERD - Continue to follow with PCP.  Continue Protonix.  Education provided to not eat late at night and to sit upright at least 30 minutes after eating.  May use as needed Pepcid.  Hypothyroidism - Continue to follow with PCP.   DM2 - 07/25/21 A1c 7.3 which was improved compared to previous. Continue to follow with PCP.   Disposition: Follow up in 3 to 4 months with Pixie Casino, MD or APP.  Signed, Loel Dubonnet, NP 01/11/2022, 11:56 AM [facility]

## 2022-01-09 NOTE — Patient Instructions (Addendum)
Medication Instructions:  Your physician has recommended you make the following change in your medication:    CHANGE Furosemide (Lasix) to as needed  CHANGE Entresto half tablet twice per day  May take Pepcid as needed for breakthrough acid reflux.   *If you need a refill on your cardiac medications before your next appointment, please call your pharmacy*   Lab Work: Your physician recommends that you return for lab work today: CBC, BMP  If you have labs (blood work) drawn today and your tests are completely normal, you will receive your results only by: [place] (if you have [place]) OR A paper copy in the mail If you have any lab test that is abnormal or we need to change your treatment, we will call you to review the results.   Testing/Procedures: Your physician has recommended that you have a sleep study. This test records several body functions during sleep, including: brain activity, eye movement, oxygen and carbon dioxide blood levels, heart rate and rhythm, breathing rate and rhythm, the flow of air through your mouth and nose, snoring, body muscle movements, and chest and belly movement.   Your physician has requested that you have an ankle brachial index (ABI) and lower extremity duplex. During this test an ultrasound and blood pressure cuff are used to evaluate the arteries that supply the arms and legs with blood. Allow thirty minutes for this exam. There are no restrictions or special instructions.    Follow-Up: At [facility], you and your health needs are our priority.  As part of our continuing mission to provide you with exceptional heart care, we have created designated Provider Care Teams.  These Care Teams include your primary Cardiologist (physician) and Advanced Practice Providers (APPs -  Physician Assistants and Nurse Practitioners) who all work together to provide you with the care you need, when you need it.  We recommend signing up for the  patient portal called "MyChart".  Sign up information is provided on this After Visit Summary.  MyChart is used to connect with patients for Virtual Visits (Telemedicine).  Patients are able to view lab/test results, encounter notes, upcoming appointments, etc.  Non-urgent messages can be sent to your provider as well.   To learn more about what you can do with MyChart, go to [email or web address].    Your next appointment:   3-4 month(s)  The format for your next appointment:   In Person  Provider:   Laurann Montana, NP    Other Instructions  Food Choices for Gastroesophageal Reflux Disease, Adult When you have gastroesophageal reflux disease (GERD), the foods you eat and your eating habits are very important. Choosing the right foods can help ease your discomfort. Think about working with a food expert (dietitian) to help you make good choices. What are tips for following this plan? Reading food labels Look for foods that are low in saturated fat. Foods that may help with your symptoms include: Foods that have less than 5% of daily value (DV) of fat. Foods that have 0 grams of trans fat. Cooking Do not fry your food. Cook your food by baking, steaming, grilling, or broiling. These are all methods that do not need a lot of fat for cooking. To add flavor, try to use herbs that are low in spice and acidity. Meal planning  Choose healthy foods that are low in fat, such as: Fruits and vegetables. Whole grains. Low-fat dairy products. Lean meats, fish, and poultry. Eat small meals often instead of  eating 3 large meals each day. Eat your meals slowly in a place where you are relaxed. Avoid bending over or lying down until 2-3 hours after eating. Limit high-fat foods such as fatty meats or fried foods. Limit your intake of fatty foods, such as oils, butter, and shortening. Avoid the following as told by your doctor: Foods that cause symptoms. These may be different for different  people. Keep a food diary to keep track of foods that cause symptoms. Alcohol. Drinking a lot of liquid with meals. Eating meals during the 2-3 hours before bed. Lifestyle Stay at a healthy weight. Ask your doctor what weight is healthy for you. If you need to lose weight, work with your doctor to do so safely. Exercise for at least 30 minutes on 5 or more days each week, or as told by your doctor. Wear loose-fitting clothes. Do not smoke or use any products that contain nicotine or tobacco. If you need help quitting, ask your doctor. Sleep with the head of your bed higher than your feet. Use a wedge under the mattress or blocks under the bed frame to raise the head of the bed. Chew sugar-free gum after meals. What foods should eat?  Eat a healthy, well-balanced diet of fruits, vegetables, whole grains, low-fat dairy products, lean meats, fish, and poultry. Each person is different. Foods that may cause symptoms in one person may not cause any symptoms in another person. Work with your doctor to find foods that are safe for you. The items listed above may not be a complete list of what you can eat and drink. Contact a food expert for more options. What foods should I avoid? Limiting some of these foods may help in managing the symptoms of GERD. Everyone is different. Talk with a food expert or your doctor to help you find the exact foods to avoid, if any. Fruits Any fruits prepared with added fat. Any fruits that cause symptoms. For some people, this may include citrus fruits, such as oranges, grapefruit, pineapple, and lemons. Vegetables Deep-fried vegetables. [place] fries. Any vegetables prepared with added fat. Any vegetables that cause symptoms. For some people, this may include tomatoes and tomato products, chili peppers, onions and garlic, and horseradish. Grains Pastries or quick breads with added fat. Meats and other proteins High-fat meats, such as fatty beef or pork, hot dogs,  ribs, ham, sausage, salami, and bacon. Fried meat or protein, including fried fish and fried chicken. Nuts and nut butters, in large amounts. Dairy Whole milk and chocolate milk. Sour cream. Cream. Ice cream. Cream cheese. Milkshakes. Fats and oils Butter. Margarine. Shortening. Ghee. Beverages Coffee and tea, with or without caffeine. Carbonated beverages. Sodas. Energy drinks. Fruit juice made with acidic fruits, such as orange or grapefruit. Tomato juice. Alcoholic drinks. Sweets and desserts Chocolate and cocoa. Donuts. Seasonings and condiments Pepper. Peppermint and spearmint. Added salt. Any condiments, herbs, or seasonings that cause symptoms. For some people, this may include curry, hot sauce, or vinegar-based salad dressings. The items listed above may not be a complete list of what you should not eat and drink. Contact a food expert for more options. Questions to ask your doctor Diet and lifestyle changes are often the first steps that are taken to manage symptoms of GERD. If diet and lifestyle changes do not help, talk with your doctor about taking medicines. Where to find more information International Foundation for Gastrointestinal Disorders: aboutgerd.org Summary When you have GERD, food and lifestyle choices are very important  in easing your symptoms. Eat small meals often instead of 3 large meals a day. Eat your meals slowly and in a place where you are relaxed. Avoid bending over or lying down until 2-3 hours after eating. Limit high-fat foods such as fatty meats or fried foods. This information is not intended to replace advice given to you by your health care provider. Make sure you discuss any questions you have with your health care provider. Document Revised: 10/26/2019 Document Reviewed: 10/26/2019 Elsevier Patient Education  [place]

## 2022-01-10 ENCOUNTER — Telehealth (HOSPITAL_BASED_OUTPATIENT_CLINIC_OR_DEPARTMENT_OTHER): Payer: Self-pay

## 2022-01-10 DIAGNOSIS — I10 Essential (primary) hypertension: Secondary | ICD-10-CM

## 2022-01-10 LAB — BASIC METABOLIC PANEL
BUN/Creatinine Ratio: 19 (ref 10–24)
BUN: 26 mg/dL (ref 8–27)
CO2: 19 mmol/L — ABNORMAL LOW (ref 20–29)
Calcium: 10.4 mg/dL — ABNORMAL HIGH (ref 8.6–10.2)
Chloride: 104 mmol/L (ref 96–106)
Creatinine, Ser: 1.35 mg/dL — ABNORMAL HIGH (ref 0.76–1.27)
Glucose: 237 mg/dL — ABNORMAL HIGH (ref 70–99)
Potassium: 4.9 mmol/L (ref 3.5–5.2)
Sodium: 144 mmol/L (ref 134–144)
eGFR: 55 mL/min/{1.73_m2} — ABNORMAL LOW (ref >59)

## 2022-01-10 LAB — CBC
Hematocrit: 32 % — ABNORMAL LOW (ref 37.5–51.0)
Hemoglobin: 10.6 g/dL — ABNORMAL LOW (ref 13.0–17.7)
MCH: 32.1 pg (ref 26.6–33.0)
MCHC: 33.1 g/dL (ref 31.5–35.7)
MCV: 97 fL (ref 79–97)
NRBC: 1 % — ABNORMAL HIGH (ref 0–0)
Platelets: 165 10*3/uL (ref 150–450)
RBC: 3.3 x10E6/uL — ABNORMAL LOW (ref 4.14–5.80)
RDW: 13.1 % (ref 11.6–15.4)
WBC: 6.7 10*3/uL (ref 3.4–10.8)

## 2022-01-10 NOTE — Telephone Encounter (Addendum)
Seen by patient [name] on 01/10/2022  9:22 AM; follow up mychart message sent to patient   ----- Message from Loel Dubonnet, NP sent at 01/10/2022  7:24 AM EDT ----- Kidney function slightly decreased from previous.  Ensure staying well-hydrated.  Reducing Lasix to only as needed and recent clinic visit will help with this.  Repeat BMP in 3 weeks.  CBC reveals mild anemia.  Recommend follow-up with primary care regarding this.  Recommend increasing dietary intake of iron rich foods such as green leafy vegetables, legumes.

## 2022-01-11 ENCOUNTER — Encounter (HOSPITAL_BASED_OUTPATIENT_CLINIC_OR_DEPARTMENT_OTHER): Payer: Self-pay | Admitting: Family

## 2022-01-14 ENCOUNTER — Other Ambulatory Visit: Payer: Self-pay | Admitting: Family Medicine

## 2022-01-15 ENCOUNTER — Encounter (HOSPITAL_BASED_OUTPATIENT_CLINIC_OR_DEPARTMENT_OTHER): Payer: Self-pay

## 2022-01-15 ENCOUNTER — Telehealth (HOSPITAL_COMMUNITY): Payer: Self-pay

## 2022-01-15 NOTE — Telephone Encounter (Signed)
No response from pt regarding PR.   Closed referral. 

## 2022-01-17 NOTE — Telephone Encounter (Signed)
Symptoms consistent with orthostasis. Orthostatic precautions: Stay well hydrated, eat regular meals, wear compression socks, with position changes slowly.   As EF is returned to normal would be reasonable to stop Entresto.  He should continue spironolactone and carvedilol at current doses.  Best,  Loel Dubonnet, NP

## 2022-01-17 NOTE — Telephone Encounter (Signed)
Called patient at his request,   He believes the Delene Loll is making him dizzy and lightheaded. He states that it is mostly when he is up and moving but he can sometimes feel it when he is sitting. He states he has been standing up and then holding for a few minutes before moving. Yesterday 120/68 pulse 57, today 112/67 62.   Patient switched to taking a half tablet twice daily and didn't notice any change in his symptoms. He tells me that he didn't want to say anything because he really wants to get better and he knows this medicine is good for him!   Routing to Laurann Montana, NP for advisement.

## 2022-01-23 ENCOUNTER — Encounter (HOSPITAL_BASED_OUTPATIENT_CLINIC_OR_DEPARTMENT_OTHER): Payer: Medicare Other

## 2022-01-24 ENCOUNTER — Ambulatory Visit (INDEPENDENT_AMBULATORY_CARE_PROVIDER_SITE_OTHER): Payer: Medicare Other

## 2022-01-24 DIAGNOSIS — M25511 Pain in right shoulder: Secondary | ICD-10-CM | POA: Diagnosis not present

## 2022-01-24 DIAGNOSIS — M25561 Pain in right knee: Secondary | ICD-10-CM | POA: Diagnosis not present

## 2022-01-24 DIAGNOSIS — I739 Peripheral vascular disease, unspecified: Secondary | ICD-10-CM

## 2022-01-24 DIAGNOSIS — I5042 Chronic combined systolic (congestive) and diastolic (congestive) heart failure: Secondary | ICD-10-CM

## 2022-01-24 DIAGNOSIS — M25562 Pain in left knee: Secondary | ICD-10-CM | POA: Diagnosis not present

## 2022-01-30 ENCOUNTER — Other Ambulatory Visit: Payer: Self-pay | Admitting: Family Medicine

## 2022-01-30 ENCOUNTER — Ambulatory Visit (INDEPENDENT_AMBULATORY_CARE_PROVIDER_SITE_OTHER): Payer: Medicare Other | Admitting: Family Medicine

## 2022-01-30 ENCOUNTER — Encounter: Payer: Self-pay | Admitting: Family Medicine

## 2022-01-30 VITALS — BP 116/67 | HR 90 | Ht 68.0 in | Wt 185.0 lb

## 2022-01-30 DIAGNOSIS — M609 Myositis, unspecified: Secondary | ICD-10-CM

## 2022-01-30 DIAGNOSIS — E039 Hypothyroidism, unspecified: Secondary | ICD-10-CM | POA: Diagnosis not present

## 2022-01-30 DIAGNOSIS — E1165 Type 2 diabetes mellitus with hyperglycemia: Secondary | ICD-10-CM

## 2022-01-30 DIAGNOSIS — D649 Anemia, unspecified: Secondary | ICD-10-CM | POA: Diagnosis not present

## 2022-01-30 DIAGNOSIS — R5383 Other fatigue: Secondary | ICD-10-CM

## 2022-01-30 DIAGNOSIS — I429 Cardiomyopathy, unspecified: Secondary | ICD-10-CM | POA: Insufficient documentation

## 2022-01-30 DIAGNOSIS — F4310 Post-traumatic stress disorder, unspecified: Secondary | ICD-10-CM

## 2022-01-30 DIAGNOSIS — Z794 Long term (current) use of insulin: Secondary | ICD-10-CM | POA: Diagnosis not present

## 2022-01-30 DIAGNOSIS — M069 Rheumatoid arthritis, unspecified: Secondary | ICD-10-CM | POA: Diagnosis not present

## 2022-01-30 DIAGNOSIS — F32A Depression, unspecified: Secondary | ICD-10-CM | POA: Insufficient documentation

## 2022-01-30 DIAGNOSIS — I2510 Atherosclerotic heart disease of native coronary artery without angina pectoris: Secondary | ICD-10-CM | POA: Insufficient documentation

## 2022-01-30 DIAGNOSIS — I10 Essential (primary) hypertension: Secondary | ICD-10-CM

## 2022-01-30 HISTORY — DX: Post-traumatic stress disorder, unspecified: F43.10

## 2022-01-30 NOTE — Patient Instructions (Signed)
We'll be in touch with lab results and recommendations.   Take lasix '20mg'$  daily for the next week.  Keep and eye on weight and BP.

## 2022-01-30 NOTE — Progress Notes (Signed)
[name] - 71 y.o. male MRN [id number]  Date of birth: July 15, 1950  Subjective Chief Complaint  Patient presents with   Leg Pain   Leg Swelling    HPI [name] is a 71 year old male here today for follow-up of leg pain.  Continues to have pain in his upper thighs.  Pain is bilateral in nature.  Feels that he has some weakness in his extremities.  This is more proximal in nature.  He does have history of rheumatoid arthritis and myositis.  Did have recent vascular study lower extremities which was relatively normal.  He has had nerve conduction as well as imaging on the lumbar spine through the [place].  Continues on 15 mg of prednisone daily and methotrexate as prescribed by his rheumatologist.  He has not had any recent lab work completed through the [place].  He also reports fatigue.  Reports he feels tired all the time.  If he sits for any period of time he tends to doze off.  He does have upcoming sleep study.  ROS:  A comprehensive ROS was completed and negative except as noted per HPI  Allergies  Allergen Reactions   Benazepril Cough   Gabapentin Swelling   Hydrocodone    Shellfish Allergy     Past Medical History:  Diagnosis Date   Autoimmune disease ([place])    Bronchitis    Chronic hiccups    Diabetes mellitus without complication (HCC)    GERD (gastroesophageal reflux disease)    Hypothyroidism    Joint pain    Necrotizing myopathy    PTSD (post-traumatic stress disorder)     Past Surgical History:  Procedure Laterality Date   BIOPSY SHOULDER     Left   IR ANGIOGRAM PULMONARY BILATERAL SELECTIVE  07/22/2021   IR THROMBECT VENO MECH MOD SED  07/22/2021   IR US GUIDE VASC ACCESS LEFT  07/22/2021   RADIOLOGY WITH ANESTHESIA Right 07/22/2021   Procedure: IR WITH ANESTHESIA;  Surgeon: Radiologist, Medication, MD;  Location: [facility];  Service: Radiology;  Laterality: Right;   UPPER LEG SOFT TISSUE BIOPSY Right     Social History   Socioeconomic History   Marital status: Married     Spouse name: Not on file   Number of children: 4   Years of education: 16   Highest education level: Not on file  Occupational History   Occupation: Retired  Tobacco Use   Smoking status: Never   Smokeless tobacco: Never  Vaping Use   Vaping Use: Never used  Substance and Sexual Activity   Alcohol use: No   Drug use: No   Sexual activity: Not on file  Other Topics Concern   Not on file  Social History Narrative   Lives at home with his wife and son.   Right-handed.   2 cups caffeine per day.   Social Determinants of Health   Financial Resource Strain: Not on file  Food Insecurity: Not on file  Transportation Needs: Not on file  Physical Activity: Not on file  Stress: Not on file  Social Connections: Not on file    Family History  Problem Relation Age of Onset   Hypertension Mother    Diabetes Mother    Arthritis Mother    Depression Mother    Heart attack Father    Hypertension Father     Health Maintenance  Topic Date Due   HEMOGLOBIN A1C  01/25/2022   Diabetic kidney evaluation - Urine ACR  02/14/2022 (Originally 03/15/2019)  COVID-19 Vaccine (6 - Moderna risk series) 02/14/2022 (Originally 07/02/2020)   Zoster Vaccines- Shingrix (1 of 2) 02/14/2022 (Originally 10/29/1967)   COLONOSCOPY (Pts 45-2yr Insurance coverage will need to be confirmed)  08/10/2022 (Originally 10/28/1993)   TETANUS/TDAP  11/15/2022 (Originally 08/19/2021)   OPHTHALMOLOGY EXAM  03/09/2022   FOOT EXAM  11/15/2022   Diabetic kidney evaluation - GFR measurement  01/10/2023   Pneumonia Vaccine 71 Years old  Completed   INFLUENZA VACCINE  Completed   Hepatitis C Screening  Completed   HPV VACCINES  Aged Out     ----------------------------------------------------------------------------------------------------------------------------------------------------------------------------------------------------------------- Physical Exam BP 116/67 (BP Location: Left Arm, Patient Position:  Sitting, Cuff Size: Normal)   Pulse 90   Ht _0  (1.727 m)   Wt 185 lb (83.9 kg)   SpO2 98%   BMI 28.13 kg/m   Physical Exam Constitutional:      Appearance: Normal appearance.  Eyes:     General: No scleral icterus. Cardiovascular:     Rate and Rhythm: Normal rate and regular rhythm.  Pulmonary:     Effort: Pulmonary effort is normal.     Breath sounds: Normal breath sounds.  Musculoskeletal:     Cervical back: Neck supple.  Neurological:     Mental Status: He is alert.  Psychiatric:        Mood and Affect: Mood normal.        Behavior: Behavior normal.     ------------------------------------------------------------------------------------------------------------------------------------------------------------------------------------------------------------------- Assessment and Plan  Myositis Continues to have proximal muscle pain with mild weakness.  Recheck ESR, CRP and CK levels.  Fatigue He does have upcoming sleep study.  Mild anemia noted previously.  Checking iron, B12 and folate.  Update TSH.   No orders of the defined types were placed in this encounter.   No follow-ups on file.    This visit occurred during the SARS-CoV-2 public health emergency.  Safety protocols were in place, including screening questions prior to the visit, additional usage of staff PPE, and extensive cleaning of exam room while observing appropriate contact time as indicated for disinfecting solutions.

## 2022-01-30 NOTE — Assessment & Plan Note (Signed)
Continues to have proximal muscle pain with mild weakness.  Recheck ESR, CRP and CK levels.

## 2022-01-30 NOTE — Assessment & Plan Note (Signed)
He does have upcoming sleep study.  Mild anemia noted previously.  Checking iron, B12 and folate.  Update TSH.

## 2022-01-31 LAB — CBC WITH DIFFERENTIAL/PLATELET
Absolute Monocytes: 143 cells/uL — ABNORMAL LOW (ref 200–950)
Basophils Absolute: 10 cells/uL (ref 0–200)
Basophils Relative: 0.2 %
Eosinophils Absolute: 61 cells/uL (ref 15–500)
Eosinophils Relative: 1.2 %
HCT: 29.8 % — ABNORMAL LOW (ref 38.5–50.0)
Hemoglobin: 10 g/dL — ABNORMAL LOW (ref 13.2–17.1)
Lymphs Abs: 1561 cells/uL (ref 850–3900)
MCH: 32.6 pg (ref 27.0–33.0)
MCHC: 33.6 g/dL (ref 32.0–36.0)
MCV: 97.1 fL (ref 80.0–100.0)
MPV: 10.3 fL (ref 7.5–12.5)
Monocytes Relative: 2.8 %
Neutro Abs: 3325 cells/uL (ref 1500–7800)
Neutrophils Relative %: 65.2 %
Platelets: 161 10*3/uL (ref 140–400)
RBC: 3.07 10*6/uL — ABNORMAL LOW (ref 4.20–5.80)
RDW: 13.4 % (ref 11.0–15.0)
Total Lymphocyte: 30.6 %
WBC: 5.1 10*3/uL (ref 3.8–10.8)

## 2022-01-31 LAB — COMPLETE METABOLIC PANEL WITH GFR
AG Ratio: 2.7 (calc) — ABNORMAL HIGH (ref 1.0–2.5)
ALT: 17 U/L (ref 9–46)
AST: 12 U/L (ref 10–35)
Albumin: 4.1 g/dL (ref 3.6–5.1)
Alkaline phosphatase (APISO): 42 U/L (ref 35–144)
BUN: 13 mg/dL (ref 7–25)
CO2: 27 mmol/L (ref 20–32)
Calcium: 9.5 mg/dL (ref 8.6–10.3)
Chloride: 110 mmol/L (ref 98–110)
Creat: 1.2 mg/dL (ref 0.70–1.28)
Globulin: 1.5 g/dL (calc) — ABNORMAL LOW (ref 1.9–3.7)
Glucose, Bld: 104 mg/dL — ABNORMAL HIGH (ref 65–99)
Potassium: 3.9 mmol/L (ref 3.5–5.3)
Sodium: 145 mmol/L (ref 135–146)
Total Bilirubin: 0.5 mg/dL (ref 0.2–1.2)
Total Protein: 5.6 g/dL — ABNORMAL LOW (ref 6.1–8.1)
eGFR: 64 mL/min/{1.73_m2} (ref >=60)

## 2022-01-31 LAB — VITAMIN B12: Vitamin B-12: 207 pg/mL (ref 200–1100)

## 2022-01-31 LAB — IRON,TIBC AND FERRITIN PANEL
%SAT: 47 % (calc) (ref 20–48)
Ferritin: 316 ng/mL (ref 24–380)
Iron: 108 ug/dL (ref 50–180)
TIBC: 230 mcg/dL (calc) — ABNORMAL LOW (ref 250–425)

## 2022-01-31 LAB — CK: Total CK: 198 U/L — ABNORMAL HIGH (ref 44–196)

## 2022-01-31 LAB — FOLATE: Folate: >24 ng/mL

## 2022-01-31 LAB — C-REACTIVE PROTEIN: CRP: 4.9 mg/L (ref <8.0)

## 2022-01-31 LAB — SEDIMENTATION RATE: Sed Rate: 11 mm/h (ref 0–20)

## 2022-01-31 LAB — TSH: TSH: 0.32 mIU/L — ABNORMAL LOW (ref 0.40–4.50)

## 2022-02-08 DIAGNOSIS — E119 Type 2 diabetes mellitus without complications: Secondary | ICD-10-CM | POA: Diagnosis not present

## 2022-02-08 DIAGNOSIS — H40003 Preglaucoma, unspecified, bilateral: Secondary | ICD-10-CM | POA: Diagnosis not present

## 2022-02-08 DIAGNOSIS — H2513 Age-related nuclear cataract, bilateral: Secondary | ICD-10-CM | POA: Diagnosis not present

## 2022-02-08 DIAGNOSIS — H34832 Tributary (branch) retinal vein occlusion, left eye, with macular edema: Secondary | ICD-10-CM | POA: Diagnosis not present

## 2022-02-08 DIAGNOSIS — H35372 Puckering of macula, left eye: Secondary | ICD-10-CM | POA: Diagnosis not present

## 2022-02-12 DIAGNOSIS — Z794 Long term (current) use of insulin: Secondary | ICD-10-CM | POA: Diagnosis not present

## 2022-02-12 DIAGNOSIS — E039 Hypothyroidism, unspecified: Secondary | ICD-10-CM | POA: Diagnosis not present

## 2022-02-12 DIAGNOSIS — E1142 Type 2 diabetes mellitus with diabetic polyneuropathy: Secondary | ICD-10-CM | POA: Diagnosis not present

## 2022-02-12 DIAGNOSIS — E1165 Type 2 diabetes mellitus with hyperglycemia: Secondary | ICD-10-CM | POA: Diagnosis not present

## 2022-02-12 DIAGNOSIS — Z79890 Hormone replacement therapy (postmenopausal): Secondary | ICD-10-CM | POA: Diagnosis not present

## 2022-02-12 DIAGNOSIS — Z7984 Long term (current) use of oral hypoglycemic drugs: Secondary | ICD-10-CM | POA: Diagnosis not present

## 2022-02-13 ENCOUNTER — Ambulatory Visit: Payer: Medicare Other | Admitting: Family Medicine

## 2022-02-13 ENCOUNTER — Other Ambulatory Visit: Payer: Self-pay | Admitting: Family Medicine

## 2022-02-16 ENCOUNTER — Ambulatory Visit
Admission: EM | Admit: 2022-02-16 | Discharge: 2022-02-16 | Disposition: A | Discharge status: Home / Self Care | Payer: Medicare Other

## 2022-02-16 DIAGNOSIS — R059 Cough, unspecified: Secondary | ICD-10-CM | POA: Diagnosis not present

## 2022-02-16 MED ORDER — DOXYCYCLINE HYCLATE 100 MG PO CAPS: 100.0000 mg | ORAL_CAPSULE | Freq: Two times a day (BID) | ORAL | 0 refills | Status: AC

## 2022-02-16 MED ORDER — BENZONATATE 200 MG PO CAPS: 200.0000 mg | ORAL_CAPSULE | Freq: Three times a day (TID) | ORAL | 0 refills | Status: AC | PRN

## 2022-02-16 NOTE — ED Triage Notes (Addendum)
Pt here today with wife who's been c/o runny nose, cough and facial pain x 10 days. Cough is non productive. No OTC meds tried. Wife also states he's having a lot more confusion than normal.   Farxiga started on Monday. Also decreased on Levothyroxine same day. Wife not sure if is causing the confusion.

## 2022-02-16 NOTE — ED Provider Notes (Signed)
[name]    CSN: [id number] Arrival date & time: 02/16/22  1403      History   Chief Complaint Chief Complaint  Patient presents with   Cough   Nasal Congestion   Facial Pain    HPI [name] is a 71 y.o. male.   HPI 71 year old male presents with facial pain, nasal congestion and cough for 10 days.  PMH significant for obesity, CAD (S/P non-STEMI), HTN, and T2DM.  Past Medical History:  Diagnosis Date   Autoimmune disease ([place])    Bronchitis    Chronic hiccups    Diabetes mellitus without complication (HCC)    GERD (gastroesophageal reflux disease)    Hypothyroidism    Joint pain    Necrotizing myopathy    PTSD (post-traumatic stress disorder)     Patient Active Problem List   Diagnosis Date Noted   CAD (coronary artery disease) 01/30/2022   Depression 01/30/2022   Cardiomyopathy ([place]) 01/30/2022   Posttraumatic stress disorder 01/30/2022   Rheumatoid arthritis ([place]) 08/09/2021   New onset of headaches after age 45 08/09/2021   Non-ST elevation (NSTEMI) myocardial infarction [facility])    Acute combined systolic and diastolic heart failure ([place])    Acute pulmonary embolism ([place]) 07/22/2021   Acute DVT (deep venous thrombosis) ([place]) 07/22/2021   Acute kidney injury superimposed on CKD stage3a  07/22/2021   Lactic acidosis 07/22/2021   Elevated troponin 07/22/2021   Acute respiratory failure with hypoxia (HCC) 07/22/2021   Anxiety 07/22/2021   Ankle swelling 01/17/2020   Fatigue 12/12/2018   GERD (gastroesophageal reflux disease) 06/04/2018   Hypogonadism in male 04/01/2018   Anemia 09/30/2017   Itching 09/30/2017   Hypothyroidism 09/30/2017   Low testosterone 09/30/2017   Benign prostatic hyperplasia without lower urinary tract symptoms 09/30/2017   Erectile dysfunction 03/19/2016   Benign essential HTN 08/08/2015   Branch retinal vein occlusion with macular edema of left eye 03/25/2015   Nuclear sclerotic cataract of both eyes 03/25/2015    Myositis 09/28/2014   DM2 (diabetes mellitus, type 2) ([place]) 09/28/2014   Abnormal glucose 10/04/2012    Past Surgical History:  Procedure Laterality Date   BIOPSY SHOULDER     Left   IR ANGIOGRAM PULMONARY BILATERAL SELECTIVE  07/22/2021   IR THROMBECT VENO MECH MOD SED  07/22/2021   IR US GUIDE VASC ACCESS LEFT  07/22/2021   RADIOLOGY WITH ANESTHESIA Right 07/22/2021   Procedure: IR WITH ANESTHESIA;  Surgeon: Radiologist, Medication, MD;  Location: [facility];  Service: Radiology;  Laterality: Right;   UPPER LEG SOFT TISSUE BIOPSY Right        Home Medications    Prior to Admission medications   Medication Sig Start Date End Date Taking? Authorizing Provider  benzonatate (TESSALON) 200 MG capsule Take 1 capsule (200 mg total) by mouth 3 (three) times daily as needed for up to 7 days. 02/16/22 02/23/22 Yes Eliezer Lofts, FNP  dapagliflozin propanediol (FARXIGA) 5 MG TABS tablet Take 5 mg by mouth daily.   Yes [provider]  doxycycline (VIBRAMYCIN) 100 MG capsule Take 1 capsule (100 mg total) by mouth 2 (two) times daily for 7 days. 02/16/22 02/23/22 Yes Eliezer Lofts, FNP  acarbose (PRECOSE) 100 MG tablet Take 100 mg by mouth 3 (three) times daily with meals.    [provider]  apixaban (ELIQUIS) 5 MG TABS tablet Take 1 tablet (5 mg total) by mouth 2 (two) times daily. 08/14/21   Loel Dubonnet, NP  atorvastatin (  LIPITOR) 80 MG tablet Take 1 tablet (80 mg total) by mouth at bedtime. 08/14/21   Loel Dubonnet, NP  baclofen (LIORESAL) 20 MG tablet TAKE 1 TABLET BY MOUTH TWICE A DAY 01/30/22   [name], DO  calcium carbonate (OS-CAL) 600 MG TABS tablet Take 600 mg by mouth daily.    [provider]  carvedilol (COREG) 12.5 MG tablet Take 1 tablet (12.5 mg total) by mouth 2 (two) times daily. 10/27/21 10/22/22  Loel Dubonnet, NP  Continuous Blood Gluc Sensor (FREESTYLE LIBRE 2 SENSOR) MISC Use to check glucose as needed.  Change every 14  days. Patient not taking: Reported on 01/30/2022 11/14/21   [name], DO  folic acid (FOLVITE) 1 MG tablet Take 1 mg by mouth daily.    [provider]  furosemide (LASIX) 20 MG tablet Take 1 tablet (20 mg total) by mouth as needed for fluid or edema. May take additional tablet ('20mg'$ ) as needed for weight gain of 2 pounds overnight or 5 pounds in 1 week. 01/09/22   Loel Dubonnet, NP  glipiZIDE (GLUCOTROL XL) 10 MG 24 hr tablet Take 10 mg by mouth 2 (two) times daily.     [provider]  insulin glargine (LANTUS) 100 UNIT/ML injection Inject 0.46-0.56 mLs (46-56 Units total) into the skin at bedtime. For now take 30 units twice a day and then increase to your usual dose as your oral intake improves. Increase both doses by 2units every day. Check your blood sugars at home three times daily. Call your doctor if sugar level is more than 300 or less than 80. Patient taking differently: Inject 42-44 Units into the skin 2 (two) times daily. Check your blood sugars at home three times daily. Call your doctor if sugar level is more than 300 or less than 80. 10/01/14   Bonnielee Haff, MD  Levothyroxine Sodium 88 MCG CAPS Take 88 mcg by mouth daily.    [provider]  loratadine (CLARITIN) 10 MG tablet daily. 12/12/21   [provider]  metFORMIN (GLUCOPHAGE-XR) 500 MG 24 hr tablet Take 1,500 mg by mouth daily. 07/24/21   [provider]  methotrexate (RHEUMATREX) 5 MG tablet Take 2 tablets (10 mg total) by mouth once a week. Caution: Chemotherapy. Protect from light. Take on fridays per patient.  OKAY TO RESUME FROM 10/08/14. Patient taking differently: Take 30 mg by mouth once a week. Caution: Chemotherapy. Protect from light. Take on fridays per patient.  6 Tabs once a week 10/01/14   Bonnielee Haff, MD  nystatin (MYCOSTATIN/NYSTOP) powder SMARTSIG:1 Application Topical 2-3 Times Daily 05/08/21   [provider]  pantoprazole (PROTONIX) 40 MG tablet  Take 1 tablet (40 mg total) by mouth as needed. 01/09/22   Loel Dubonnet, NP  polyvinyl alcohol (LIQUIFILM TEARS) 1.4 % ophthalmic solution 1 drop as needed for dry eyes.    [provider]  predniSONE (DELTASONE) 5 MG tablet Take 15 mg by mouth daily. 11/30/21   [provider]  QUEtiapine (SEROQUEL) 25 MG tablet Take 1 tablet (25 mg total) by mouth at bedtime. 02/13/22   [name], DO  sacubitril-valsartan (ENTRESTO) 24-26 MG Take 0.5 tablets by mouth 2 (two) times daily. 01/09/22   Loel Dubonnet, NP  sertraline (ZOLOFT) 100 MG tablet Take 100 mg by mouth daily.    [provider]  spironolactone (ALDACTONE) 25 MG tablet Take 0.5 tablets (12.5 mg total) by mouth daily. 10/09/21 10/04/22  Loel Dubonnet,  NP  traMADol (ULTRAM) 50 MG tablet Take 100 mg by mouth 3 (three) times daily as needed for moderate pain. 08/04/19   [provider]  triamcinolone cream (KENALOG) 0.1 % Use 1 application twice daily as needed to red itchy areas below the face 09/30/18   Kennith Gain, MD    Family History Family History  Problem Relation Age of Onset   Hypertension Mother    Diabetes Mother    Arthritis Mother    Depression Mother    Heart attack Father    Hypertension Father     Social History Social History   Tobacco Use   Smoking status: Never   Smokeless tobacco: Never  Vaping Use   Vaping Use: Never used  Substance Use Topics   Alcohol use: No   Drug use: No     Allergies   Benazepril, Gabapentin, Hydrocodone, and Shellfish allergy   Review of Systems Review of Systems  HENT:  Positive for congestion, sinus pressure and sinus pain.   Respiratory:  Positive for cough.   All other systems reviewed and are negative.    Physical Exam Triage Vital Signs ED Triage Vitals  Enc Vitals Group     BP 02/16/22 1429 114/74     Pulse Rate 02/16/22 1429 81     Resp 02/16/22 1429 17     Temp 02/16/22 1429 99 F (37.2 C)     Temp  Source 02/16/22 1429 Oral     SpO2 02/16/22 1429 98 %     Weight --      Height --      Head Circumference --      Peak Flow --      Pain Score 02/16/22 1417 0     Pain Loc --      Pain Edu? --      Excl. in [place]? --    No data found.  Updated Vital Signs BP 114/74 (BP Location: Left Arm)   Pulse 81   Temp 99 F (37.2 C) (Oral)   Resp 17   SpO2 98%      Physical Exam Vitals and nursing note reviewed.  Constitutional:      General: He is not in acute distress.    Appearance: Normal appearance. He is obese. He is not ill-appearing.  HENT:     Head: Normocephalic and atraumatic.     Right Ear: Tympanic membrane, ear canal and external ear normal.     Left Ear: Tympanic membrane, ear canal and external ear normal.     Nose: Nose normal.     Mouth/Throat:     Mouth: Mucous membranes are moist.     Pharynx: Oropharynx is clear.  Eyes:     Extraocular Movements: Extraocular movements intact.     Conjunctiva/sclera: Conjunctivae normal.     Pupils: Pupils are equal, round, and reactive to light.  Cardiovascular:     Rate and Rhythm: Normal rate and regular rhythm.     Pulses: Normal pulses.     Heart sounds: Normal heart sounds. No murmur heard. Pulmonary:     Effort: Pulmonary effort is normal.     Breath sounds: Normal breath sounds. No wheezing, rhonchi or rales.     Comments: Infrequent nonproductive cough noted on exam Musculoskeletal:        General: Normal range of motion.     Cervical back: Normal range of motion and neck supple.  Skin:    General: Skin is warm and  dry.  Neurological:     General: No focal deficit present.     Mental Status: He is alert and oriented to person, place, and time. Mental status is at baseline.      UC Treatments / Results  Labs (all labs ordered are listed, but only abnormal results are displayed) Labs Reviewed - No data to display  EKG   Radiology No results found.  Procedures Procedures (including critical care  time)  Medications Ordered in UC Medications - No data to display  Initial Impression / Assessment and Plan / UC Course  I have reviewed the triage vital signs and the nursing notes.  Pertinent labs & imaging results that were available during my care of the patient were reviewed by me and considered in my medical decision making (see chart for details).     MDM: 1.  Cough-Rx'd Doxycycline, Tessalon. Advised patient to take medication as directed with food to completion.  Advised may take Tessalon Perles daily or as needed for cough.  Encouraged patient to increase daily water intake while taking these medications.  Advised patient if symptoms worsen and/or unresolved please follow-up with PCP or here for further evaluation. Final Clinical Impressions(s) / UC Diagnoses   Final diagnoses:  Cough, unspecified type     Discharge Instructions      Advised patient to take medication as directed with food to completion.  Advised may take Tessalon Perles daily or as needed for cough.  Encouraged patient to increase daily water intake while taking these medications.  Advised patient if symptoms worsen and/or unresolved please follow-up with PCP or here for further evaluation.     ED Prescriptions     Medication Sig Dispense Auth. Provider   doxycycline (VIBRAMYCIN) 100 MG capsule Take 1 capsule (100 mg total) by mouth 2 (two) times daily for 7 days. 20 capsule Eliezer Lofts, FNP   benzonatate (TESSALON) 200 MG capsule Take 1 capsule (200 mg total) by mouth 3 (three) times daily as needed for up to 7 days. 30 capsule Eliezer Lofts, FNP      PDMP not reviewed this encounter.   Eliezer Lofts, [place] 02/16/22 1457

## 2022-02-16 NOTE — Discharge Instructions (Addendum)
Advised patient to take medication as directed with food to completion.  Advised may take Tessalon Perles daily or as needed for cough.  Encouraged patient to increase daily water intake while taking these medications.  Advised patient if symptoms worsen and/or unresolved please follow-up with PCP or here for further evaluation.

## 2022-02-17 ENCOUNTER — Telehealth: Payer: Self-pay

## 2022-02-17 NOTE — Telephone Encounter (Signed)
TC to f/u after yesterday's visit to [facility]. No answer; left VM to call [phone] for problems or questions. 

## 2022-02-20 ENCOUNTER — Ambulatory Visit (HOSPITAL_BASED_OUTPATIENT_CLINIC_OR_DEPARTMENT_OTHER): Payer: Medicare Other | Attending: Family | Admitting: Cardiology

## 2022-02-20 DIAGNOSIS — I739 Peripheral vascular disease, unspecified: Secondary | ICD-10-CM | POA: Insufficient documentation

## 2022-02-20 DIAGNOSIS — M25511 Pain in right shoulder: Secondary | ICD-10-CM | POA: Diagnosis not present

## 2022-02-20 DIAGNOSIS — M25562 Pain in left knee: Secondary | ICD-10-CM | POA: Diagnosis not present

## 2022-02-20 DIAGNOSIS — M25661 Stiffness of right knee, not elsewhere classified: Secondary | ICD-10-CM | POA: Diagnosis not present

## 2022-02-20 DIAGNOSIS — I5042 Chronic combined systolic (congestive) and diastolic (congestive) heart failure: Secondary | ICD-10-CM | POA: Insufficient documentation

## 2022-02-20 DIAGNOSIS — M25561 Pain in right knee: Secondary | ICD-10-CM

## 2022-02-20 DIAGNOSIS — G4733 Obstructive sleep apnea (adult) (pediatric): Secondary | ICD-10-CM | POA: Diagnosis not present

## 2022-02-22 NOTE — Procedures (Signed)
   Patient Name: [name], [name] Date: 02/20/2022 Gender: Male D.O.B: Jun 02, 1950 Age (years): 58 Referring Provider: Loel Dubonnet NP Height (inches): 18 Interpreting Physician: Fransico Him MD, ABSM Weight (lbs): 210 RPSGT: Jacolyn Reedy BMI: 32 MRN: [id number] Neck Size: 17.00  CLINICAL INFORMATION Sleep Study Type: HST  Indication for sleep study: N/A  Epworth Sleepiness Score: 14  SLEEP STUDY TECHNIQUE A multi-channel overnight portable sleep study was performed. The channels recorded were: nasal airflow, thoracic respiratory movement, and oxygen saturation with a pulse oximetry. Snoring was also monitored.  MEDICATIONS Patient self administered medications include: N/A.  SLEEP ARCHITECTURE Patient was studied for 384 minutes. The sleep efficiency was 100.0 % and the patient was supine for 0%. The arousal index was 0.0 per hour.  RESPIRATORY PARAMETERS The overall AHI was 22.0 per hour, with a central apnea index of 0 per hour.  The oxygen nadir was 91% during sleep.  CARDIAC DATA Mean heart rate during sleep was 89.1 bpm.  IMPRESSIONS - Moderate obstructive sleep apnea occurred during this study (AHI = 22.0/h). - The patient had minimal or no oxygen desaturation during the study (Min O2 = 91%) - Patient snored 13.6% during the sleep.  DIAGNOSIS - Obstructive Sleep Apnea (G47.33)  RECOMMENDATIONS - Recommend CPAP titration.  - Positional therapy avoiding supine position during sleep. - Avoid alcohol, sedatives and other CNS depressants that may worsen sleep apnea and disrupt normal sleep architecture. - Sleep hygiene should be reviewed to assess factors that may improve sleep quality. - Weight management and regular exercise should be initiated or continued.  [Electronically signed] 02/22/2022 05:01 PM  Fransico Him MD, ABSM Diplomate, American Board of Sleep Medicine

## 2022-03-01 ENCOUNTER — Telehealth: Payer: Self-pay | Admitting: *Deleted

## 2022-03-01 NOTE — Telephone Encounter (Signed)
-----   Message from Sueanne Margarita, MD sent at 02/22/2022  5:02 PM EDT ----- Please let patient know that they have sleep apnea.  Recommend therapeutic CPAP titration for treatment of patient's sleep disordered breathing.  If unable to perform an in lab titration then initiate ResMed auto CPAP from 4 to 15cm H2O with heated humidity and mask of choice and overnight pulse ox on CPAP.

## 2022-03-01 NOTE — Telephone Encounter (Signed)
Left message to return a call to discuss sleep study results. 

## 2022-03-02 ENCOUNTER — Other Ambulatory Visit (HOSPITAL_BASED_OUTPATIENT_CLINIC_OR_DEPARTMENT_OTHER): Payer: Self-pay | Admitting: Family

## 2022-03-02 NOTE — Telephone Encounter (Signed)
Pt of Dr. Debara Pickett. Please review for refill. Thank you!

## 2022-03-05 ENCOUNTER — Other Ambulatory Visit: Payer: Self-pay | Admitting: Cardiology

## 2022-03-05 DIAGNOSIS — G4733 Obstructive sleep apnea (adult) (pediatric): Secondary | ICD-10-CM

## 2022-03-05 NOTE — Telephone Encounter (Signed)
-----   Message from Sueanne Margarita, MD sent at 02/22/2022  5:02 PM EDT ----- Please let patient know that they have sleep apnea.  Recommend therapeutic CPAP titration for treatment of patient's sleep disordered breathing.  If unable to perform an in lab titration then initiate ResMed auto CPAP from 4 to 15cm H2O with heated humidity and mask of choice and overnight pulse ox on CPAP.

## 2022-03-05 NOTE — Telephone Encounter (Signed)
Patient returned a call to me and was given sleep study results and recommendations. After much discussion and answering all of the questions he and his wife had he agrees to proceed with CPAP titration pending insurance approval.

## 2022-03-11 ENCOUNTER — Emergency Department (HOSPITAL_COMMUNITY): Payer: No Typology Code available for payment source

## 2022-03-11 ENCOUNTER — Other Ambulatory Visit: Payer: Self-pay

## 2022-03-11 ENCOUNTER — Inpatient Hospital Stay (HOSPITAL_COMMUNITY)
Admission: EM | Admit: 2022-03-11 | Discharge: 2022-03-15 | DRG: 193 | Disposition: A | Discharge status: Home / Self Care | Payer: No Typology Code available for payment source | Attending: Internal Medicine | Admitting: Internal Medicine

## 2022-03-11 DIAGNOSIS — E1165 Type 2 diabetes mellitus with hyperglycemia: Secondary | ICD-10-CM | POA: Diagnosis present

## 2022-03-11 DIAGNOSIS — D539 Nutritional anemia, unspecified: Secondary | ICD-10-CM | POA: Diagnosis present

## 2022-03-11 DIAGNOSIS — I5032 Chronic diastolic (congestive) heart failure: Secondary | ICD-10-CM | POA: Diagnosis present

## 2022-03-11 DIAGNOSIS — F4310 Post-traumatic stress disorder, unspecified: Secondary | ICD-10-CM | POA: Diagnosis present

## 2022-03-11 DIAGNOSIS — E669 Obesity, unspecified: Secondary | ICD-10-CM | POA: Diagnosis present

## 2022-03-11 DIAGNOSIS — I252 Old myocardial infarction: Secondary | ICD-10-CM | POA: Diagnosis not present

## 2022-03-11 DIAGNOSIS — I5031 Acute diastolic (congestive) heart failure: Secondary | ICD-10-CM | POA: Diagnosis not present

## 2022-03-11 DIAGNOSIS — I429 Cardiomyopathy, unspecified: Secondary | ICD-10-CM | POA: Diagnosis present

## 2022-03-11 DIAGNOSIS — Z833 Family history of diabetes mellitus: Secondary | ICD-10-CM

## 2022-03-11 DIAGNOSIS — I10 Essential (primary) hypertension: Secondary | ICD-10-CM | POA: Diagnosis present

## 2022-03-11 DIAGNOSIS — Z79899 Other long term (current) drug therapy: Secondary | ICD-10-CM

## 2022-03-11 DIAGNOSIS — Z7984 Long term (current) use of oral hypoglycemic drugs: Secondary | ICD-10-CM

## 2022-03-11 DIAGNOSIS — Z79890 Hormone replacement therapy (postmenopausal): Secondary | ICD-10-CM

## 2022-03-11 DIAGNOSIS — E1122 Type 2 diabetes mellitus with diabetic chronic kidney disease: Secondary | ICD-10-CM | POA: Diagnosis present

## 2022-03-11 DIAGNOSIS — I5042 Chronic combined systolic (congestive) and diastolic (congestive) heart failure: Secondary | ICD-10-CM | POA: Diagnosis present

## 2022-03-11 DIAGNOSIS — J121 Respiratory syncytial virus pneumonia: Principal | ICD-10-CM | POA: Diagnosis present

## 2022-03-11 DIAGNOSIS — I517 Cardiomegaly: Secondary | ICD-10-CM | POA: Diagnosis not present

## 2022-03-11 DIAGNOSIS — I5033 Acute on chronic diastolic (congestive) heart failure: Secondary | ICD-10-CM | POA: Diagnosis present

## 2022-03-11 DIAGNOSIS — Z885 Allergy status to narcotic agent status: Secondary | ICD-10-CM | POA: Diagnosis not present

## 2022-03-11 DIAGNOSIS — M069 Rheumatoid arthritis, unspecified: Secondary | ICD-10-CM | POA: Diagnosis present

## 2022-03-11 DIAGNOSIS — N183 Chronic kidney disease, stage 3 unspecified: Secondary | ICD-10-CM | POA: Diagnosis present

## 2022-03-11 DIAGNOSIS — I130 Hypertensive heart and chronic kidney disease with heart failure and stage 1 through stage 4 chronic kidney disease, or unspecified chronic kidney disease: Secondary | ICD-10-CM | POA: Diagnosis present

## 2022-03-11 DIAGNOSIS — K219 Gastro-esophageal reflux disease without esophagitis: Secondary | ICD-10-CM | POA: Diagnosis present

## 2022-03-11 DIAGNOSIS — R918 Other nonspecific abnormal finding of lung field: Secondary | ICD-10-CM | POA: Diagnosis not present

## 2022-03-11 DIAGNOSIS — Z794 Long term (current) use of insulin: Secondary | ICD-10-CM

## 2022-03-11 DIAGNOSIS — J0110 Acute frontal sinusitis, unspecified: Secondary | ICD-10-CM | POA: Diagnosis present

## 2022-03-11 DIAGNOSIS — R4182 Altered mental status, unspecified: Secondary | ICD-10-CM | POA: Diagnosis not present

## 2022-03-11 DIAGNOSIS — Z91013 Allergy to seafood: Secondary | ICD-10-CM

## 2022-03-11 DIAGNOSIS — E11649 Type 2 diabetes mellitus with hypoglycemia without coma: Secondary | ICD-10-CM | POA: Diagnosis not present

## 2022-03-11 DIAGNOSIS — Z8249 Family history of ischemic heart disease and other diseases of the circulatory system: Secondary | ICD-10-CM

## 2022-03-11 DIAGNOSIS — Z86718 Personal history of other venous thrombosis and embolism: Secondary | ICD-10-CM

## 2022-03-11 DIAGNOSIS — G9341 Metabolic encephalopathy: Secondary | ICD-10-CM | POA: Diagnosis not present

## 2022-03-11 DIAGNOSIS — E162 Hypoglycemia, unspecified: Secondary | ICD-10-CM

## 2022-03-11 DIAGNOSIS — I2489 Other forms of acute ischemic heart disease: Secondary | ICD-10-CM | POA: Diagnosis not present

## 2022-03-11 DIAGNOSIS — G4733 Obstructive sleep apnea (adult) (pediatric): Secondary | ICD-10-CM | POA: Diagnosis present

## 2022-03-11 DIAGNOSIS — Z7901 Long term (current) use of anticoagulants: Secondary | ICD-10-CM

## 2022-03-11 DIAGNOSIS — E119 Type 2 diabetes mellitus without complications: Secondary | ICD-10-CM

## 2022-03-11 DIAGNOSIS — E785 Hyperlipidemia, unspecified: Secondary | ICD-10-CM | POA: Diagnosis not present

## 2022-03-11 DIAGNOSIS — Z6834 Body mass index (BMI) 34.0-34.9, adult: Secondary | ICD-10-CM

## 2022-03-11 DIAGNOSIS — Z7952 Long term (current) use of systemic steroids: Secondary | ICD-10-CM

## 2022-03-11 DIAGNOSIS — N1831 Chronic kidney disease, stage 3a: Secondary | ICD-10-CM | POA: Diagnosis not present

## 2022-03-11 DIAGNOSIS — Z818 Family history of other mental and behavioral disorders: Secondary | ICD-10-CM

## 2022-03-11 DIAGNOSIS — I2510 Atherosclerotic heart disease of native coronary artery without angina pectoris: Secondary | ICD-10-CM | POA: Diagnosis not present

## 2022-03-11 DIAGNOSIS — E039 Hypothyroidism, unspecified: Secondary | ICD-10-CM | POA: Diagnosis present

## 2022-03-11 DIAGNOSIS — Z888 Allergy status to other drugs, medicaments and biological substances status: Secondary | ICD-10-CM | POA: Diagnosis not present

## 2022-03-11 DIAGNOSIS — Z8261 Family history of arthritis: Secondary | ICD-10-CM

## 2022-03-11 DIAGNOSIS — J189 Pneumonia, unspecified organism: Secondary | ICD-10-CM

## 2022-03-11 DIAGNOSIS — J0111 Acute recurrent frontal sinusitis: Secondary | ICD-10-CM | POA: Diagnosis not present

## 2022-03-11 DIAGNOSIS — Z86711 Personal history of pulmonary embolism: Secondary | ICD-10-CM | POA: Diagnosis not present

## 2022-03-11 DIAGNOSIS — I2583 Coronary atherosclerosis due to lipid rich plaque: Secondary | ICD-10-CM

## 2022-03-11 DIAGNOSIS — G7249 Other inflammatory and immune myopathies, not elsewhere classified: Secondary | ICD-10-CM | POA: Diagnosis present

## 2022-03-11 DIAGNOSIS — I5030 Unspecified diastolic (congestive) heart failure: Secondary | ICD-10-CM | POA: Diagnosis present

## 2022-03-11 HISTORY — DX: Respiratory syncytial virus pneumonia: J12.1

## 2022-03-11 LAB — RESPIRATORY PANEL BY PCR

## 2022-03-11 LAB — URINALYSIS, ROUTINE W REFLEX MICROSCOPIC
Bacteria, UA: NONE SEEN
Bilirubin Urine: NEGATIVE
Glucose, UA: >=500 mg/dL — AB
Ketones, ur: NEGATIVE mg/dL
Leukocytes,Ua: NEGATIVE
Nitrite: NEGATIVE
Protein, ur: NEGATIVE mg/dL
Specific Gravity, Urine: 1.011 (ref 1.005–1.030)
pH: 5 (ref 5.0–8.0)

## 2022-03-11 LAB — CBG MONITORING, ED
Glucose-Capillary: 134 mg/dL — ABNORMAL HIGH (ref 70–99)
Glucose-Capillary: 49 mg/dL — ABNORMAL LOW (ref 70–99)
Glucose-Capillary: 119 mg/dL — ABNORMAL HIGH (ref 70–99)
Glucose-Capillary: 48 mg/dL — ABNORMAL LOW (ref 70–99)
Glucose-Capillary: 139 mg/dL — ABNORMAL HIGH (ref 70–99)
Glucose-Capillary: 239 mg/dL — ABNORMAL HIGH (ref 70–99)

## 2022-03-11 LAB — CBC WITH DIFFERENTIAL/PLATELET
Abs Immature Granulocytes: 0.03 10*3/uL (ref 0.00–0.07)
Basophils Absolute: 0 10*3/uL (ref 0.0–0.1)
Basophils Relative: 0 %
Eosinophils Absolute: 0 10*3/uL (ref 0.0–0.5)
Eosinophils Relative: 1 %
HCT: 30.6 % — ABNORMAL LOW (ref 39.0–52.0)
Hemoglobin: 9.7 g/dL — ABNORMAL LOW (ref 13.0–17.0)
Immature Granulocytes: 1 %
Lymphocytes Relative: 26 %
Lymphs Abs: 1.4 10*3/uL (ref 0.7–4.0)
MCH: 31.8 pg (ref 26.0–34.0)
MCHC: 31.7 g/dL (ref 30.0–36.0)
MCV: 100.3 fL — ABNORMAL HIGH (ref 80.0–100.0)
Monocytes Absolute: 0.5 10*3/uL (ref 0.1–1.0)
Monocytes Relative: 10 %
Neutro Abs: 3.3 10*3/uL (ref 1.7–7.7)
Neutrophils Relative %: 62 %
Platelets: 168 10*3/uL (ref 150–400)
RBC: 3.05 MIL/uL — ABNORMAL LOW (ref 4.22–5.81)
RDW: 14.6 % (ref 11.5–15.5)
WBC: 5.2 10*3/uL (ref 4.0–10.5)
nRBC: 0.6 % — ABNORMAL HIGH (ref 0.0–0.2)

## 2022-03-11 LAB — COMPREHENSIVE METABOLIC PANEL
ALT: 27 U/L (ref 0–44)
AST: 27 U/L (ref 15–41)
Albumin: 3.7 g/dL (ref 3.5–5.0)
Alkaline Phosphatase: 44 U/L (ref 38–126)
Anion gap: 9 (ref 5–15)
BUN: 23 mg/dL (ref 8–23)
CO2: 23 mmol/L (ref 22–32)
Calcium: 9.5 mg/dL (ref 8.9–10.3)
Chloride: 110 mmol/L (ref 98–111)
Creatinine, Ser: 1.32 mg/dL — ABNORMAL HIGH (ref 0.61–1.24)
GFR, Estimated: 57 mL/min — ABNORMAL LOW (ref >60)
Glucose, Bld: 54 mg/dL — ABNORMAL LOW (ref 70–99)
Potassium: 4.5 mmol/L (ref 3.5–5.1)
Sodium: 142 mmol/L (ref 135–145)
Total Bilirubin: 1.1 mg/dL (ref 0.3–1.2)
Total Protein: 6.1 g/dL — ABNORMAL LOW (ref 6.5–8.1)

## 2022-03-11 LAB — RAPID URINE DRUG SCREEN, HOSP PERFORMED
Amphetamines: NOT DETECTED
Barbiturates: NOT DETECTED
Benzodiazepines: NOT DETECTED
Cocaine: NOT DETECTED
Opiates: NOT DETECTED
Tetrahydrocannabinol: NOT DETECTED

## 2022-03-11 LAB — T4, FREE: Free T4: 0.83 ng/dL (ref 0.61–1.12)

## 2022-03-11 LAB — TROPONIN I (HIGH SENSITIVITY): Troponin I (High Sensitivity): 35 ng/L — ABNORMAL HIGH (ref <18)

## 2022-03-11 LAB — BRAIN NATRIURETIC PEPTIDE: B Natriuretic Peptide: 102.8 pg/mL — ABNORMAL HIGH (ref 0.0–100.0)

## 2022-03-11 LAB — TSH: TSH: 0.869 u[IU]/mL (ref 0.350–4.500)

## 2022-03-11 LAB — AMMONIA: Ammonia: 48 umol/L — ABNORMAL HIGH (ref 9–35)

## 2022-03-11 LAB — PROCALCITONIN: Procalcitonin: 0.29 ng/mL

## 2022-03-11 LAB — HEMOGLOBIN A1C
Hgb A1c MFr Bld: 9 % — ABNORMAL HIGH (ref 4.8–5.6)
Mean Plasma Glucose: 211.6 mg/dL

## 2022-03-11 LAB — LACTIC ACID, PLASMA: Lactic Acid, Venous: 1.7 mmol/L (ref 0.5–1.9)

## 2022-03-11 LAB — CK: Total CK: 1504 U/L — ABNORMAL HIGH (ref 49–397)

## 2022-03-11 LAB — ETHANOL: Alcohol, Ethyl (B): <10 mg/dL (ref <10)

## 2022-03-11 MED ORDER — FUROSEMIDE 10 MG/ML IJ SOLN
40.0000 mg | Freq: Two times a day (BID) | INTRAMUSCULAR | Status: DC
  Administered 2022-03-11 – 2022-03-13 (×4): 40 mg via INTRAVENOUS
  Filled 2022-03-11 (×4): qty 4

## 2022-03-11 MED ORDER — TRAMADOL HCL 50 MG PO TABS
50.0000 mg | ORAL_TABLET | Freq: Four times a day (QID) | ORAL | Status: DC | PRN
  Administered 2022-03-12 – 2022-03-15 (×6): 50 mg via ORAL
  Filled 2022-03-11 (×6): qty 1

## 2022-03-11 MED ORDER — ONDANSETRON HCL 4 MG PO TABS: 4.0000 mg | ORAL_TABLET | Freq: Four times a day (QID) | ORAL | Status: DC | PRN

## 2022-03-11 MED ORDER — SODIUM CHLORIDE 0.9 % IV SOLN
500.0000 mg | INTRAVENOUS | Status: DC
  Administered 2022-03-12 – 2022-03-13 (×2): 500 mg via INTRAVENOUS
  Filled 2022-03-11 (×2): qty 5

## 2022-03-11 MED ORDER — APIXABAN 5 MG PO TABS
5.0000 mg | ORAL_TABLET | Freq: Two times a day (BID) | ORAL | Status: DC
  Administered 2022-03-11 – 2022-03-15 (×9): 5 mg via ORAL
  Filled 2022-03-11 (×9): qty 1

## 2022-03-11 MED ORDER — SODIUM CHLORIDE 0.9 % IV SOLN: 1.0000 g | Freq: Once | INTRAVENOUS | Status: DC

## 2022-03-11 MED ORDER — PREDNISONE 5 MG PO TABS
15.0000 mg | ORAL_TABLET | Freq: Every day | ORAL | Status: DC
  Administered 2022-03-11 – 2022-03-13 (×3): 15 mg via ORAL
  Filled 2022-03-11 (×4): qty 3

## 2022-03-11 MED ORDER — SODIUM CHLORIDE 0.9 % IV SOLN
2.0000 g | INTRAVENOUS | Status: DC
  Administered 2022-03-12 – 2022-03-14 (×3): 2 g via INTRAVENOUS
  Filled 2022-03-11 (×3): qty 20

## 2022-03-11 MED ORDER — MORPHINE SULFATE (PF) 2 MG/ML IV SOLN
2.0000 mg | INTRAVENOUS | Status: DC | PRN
  Administered 2022-03-11 – 2022-03-12 (×2): 2 mg via INTRAVENOUS
  Filled 2022-03-11 (×2): qty 1

## 2022-03-11 MED ORDER — LACTATED RINGERS IV BOLUS
1000.0000 mL | Freq: Once | INTRAVENOUS | Status: AC
  Administered 2022-03-11: 1000 mL via INTRAVENOUS

## 2022-03-11 MED ORDER — LIP MEDEX EX OINT
1.0000 | TOPICAL_OINTMENT | Freq: Once | CUTANEOUS | Status: AC
  Administered 2022-03-11: 1 via TOPICAL
  Filled 2022-03-11: qty 7

## 2022-03-11 MED ORDER — SODIUM CHLORIDE 0.9% FLUSH
3.0000 mL | Freq: Two times a day (BID) | INTRAVENOUS | Status: DC
  Administered 2022-03-11 – 2022-03-15 (×9): 3 mL via INTRAVENOUS

## 2022-03-11 MED ORDER — CYCLOSPORINE 0.05 % OP EMUL
1.0000 [drp] | Freq: Two times a day (BID) | OPHTHALMIC | Status: DC
  Administered 2022-03-12 – 2022-03-15 (×7): 1 [drp] via OPHTHALMIC
  Filled 2022-03-11 (×9): qty 30

## 2022-03-11 MED ORDER — SODIUM CHLORIDE 0.9 % IV SOLN
2.0000 g | Freq: Once | INTRAVENOUS | Status: AC
  Administered 2022-03-11: 2 g via INTRAVENOUS
  Filled 2022-03-11: qty 20

## 2022-03-11 MED ORDER — ACETAMINOPHEN 650 MG RE SUPP: 650.0000 mg | Freq: Four times a day (QID) | RECTAL | Status: DC | PRN

## 2022-03-11 MED ORDER — ONDANSETRON HCL 4 MG/2ML IJ SOLN: 4.0000 mg | Freq: Four times a day (QID) | INTRAMUSCULAR | Status: DC | PRN

## 2022-03-11 MED ORDER — ACETAMINOPHEN 325 MG PO TABS
650.0000 mg | ORAL_TABLET | Freq: Four times a day (QID) | ORAL | Status: DC | PRN
  Administered 2022-03-11 – 2022-03-14 (×4): 650 mg via ORAL
  Filled 2022-03-11 (×4): qty 2

## 2022-03-11 MED ORDER — DEXTROSE 50 % IV SOLN
1.0000 | INTRAVENOUS | Status: DC | PRN
  Filled 2022-03-11: qty 50

## 2022-03-11 MED ORDER — PANTOPRAZOLE SODIUM 40 MG PO TBEC: 40.0000 mg | DELAYED_RELEASE_TABLET | ORAL | Status: DC | PRN

## 2022-03-11 MED ORDER — QUETIAPINE FUMARATE 25 MG PO TABS
25.0000 mg | ORAL_TABLET | Freq: Every day | ORAL | Status: DC
  Administered 2022-03-12 – 2022-03-14 (×4): 25 mg via ORAL
  Filled 2022-03-11 (×4): qty 1

## 2022-03-11 MED ORDER — ALBUTEROL SULFATE (2.5 MG/3ML) 0.083% IN NEBU: 2.5000 mg | INHALATION_SOLUTION | RESPIRATORY_TRACT | Status: DC | PRN

## 2022-03-11 MED ORDER — TRIAMCINOLONE ACETONIDE 0.1 % EX CREA: TOPICAL_CREAM | Freq: Two times a day (BID) | CUTANEOUS | Status: DC | PRN

## 2022-03-11 MED ORDER — LORATADINE 10 MG PO TABS
10.0000 mg | ORAL_TABLET | Freq: Every day | ORAL | Status: DC
  Administered 2022-03-11 – 2022-03-15 (×5): 10 mg via ORAL
  Filled 2022-03-11 (×5): qty 1

## 2022-03-11 MED ORDER — CARVEDILOL 12.5 MG PO TABS
12.5000 mg | ORAL_TABLET | Freq: Two times a day (BID) | ORAL | Status: DC
  Administered 2022-03-12 – 2022-03-14 (×5): 12.5 mg via ORAL
  Filled 2022-03-11 (×5): qty 1

## 2022-03-11 MED ORDER — GUAIFENESIN ER 600 MG PO TB12
600.0000 mg | ORAL_TABLET | Freq: Two times a day (BID) | ORAL | Status: DC
  Administered 2022-03-11 – 2022-03-15 (×9): 600 mg via ORAL
  Filled 2022-03-11 (×9): qty 1

## 2022-03-11 MED ORDER — SODIUM CHLORIDE 0.9 % IV SOLN
500.0000 mg | Freq: Once | INTRAVENOUS | Status: AC
  Administered 2022-03-11: 500 mg via INTRAVENOUS
  Filled 2022-03-11: qty 5

## 2022-03-11 MED ORDER — POLYVINYL ALCOHOL 1.4 % OP SOLN
1.0000 [drp] | OPHTHALMIC | Status: DC | PRN
  Administered 2022-03-13 (×2): 1 [drp] via OPHTHALMIC
  Filled 2022-03-11: qty 15

## 2022-03-11 MED ORDER — SACUBITRIL-VALSARTAN 24-26 MG PO TABS
0.5000 | ORAL_TABLET | Freq: Two times a day (BID) | ORAL | Status: DC
  Administered 2022-03-12 (×2): 0.5 via ORAL
  Filled 2022-03-11 (×2): qty 1

## 2022-03-11 MED ORDER — LEVOTHYROXINE SODIUM 75 MCG PO TABS
75.0000 ug | ORAL_TABLET | Freq: Every day | ORAL | Status: DC
  Administered 2022-03-12 – 2022-03-15 (×4): 75 ug via ORAL
  Filled 2022-03-11 (×4): qty 1

## 2022-03-11 MED ORDER — ATORVASTATIN CALCIUM 80 MG PO TABS
80.0000 mg | ORAL_TABLET | Freq: Every evening | ORAL | Status: DC
  Administered 2022-03-12 – 2022-03-14 (×4): 80 mg via ORAL
  Filled 2022-03-11: qty 2
  Filled 2022-03-11 (×3): qty 1

## 2022-03-11 MED ORDER — DEXTROSE 50 % IV SOLN
INTRAVENOUS | Status: AC
  Administered 2022-03-11: 50 mL
  Filled 2022-03-11: qty 50

## 2022-03-11 NOTE — ED Notes (Addendum)
CBG now 139

## 2022-03-11 NOTE — H&P (Addendum)
History and Physical    Patient: [name] DZH:[id number] DOB: 22-Nov-1950 DOA: 03/11/2022 DOS: the patient was seen and examined on 03/11/2022 PCP: [name], DO  Patient coming from: Home via EMS   Chief Complaint:  Chief Complaint  Patient presents with   Altered Mental Status   HPI: [name] is a 71 y.o. male with medical history significant of HFpEF, DM type II, autoimmune myopathy/myositis, hypothyroidism, PE/DVT, and CKD stage III who presented to being noted to be acutely altered.  History is obtained mostly from the patient's wife as the patient appears to be in discomfort.  Yesterday, they had driven their son back to [employer] and on the way back she noticed that he started speaking in third person, said that the dog could talk through [place], and that his wife should be able to see the moon with binoculars.  His last dose of glipizide admitted yesterday morning and his wife had checked his blood sugars but noted they had been 1 80-300.  Prior to their son getting home patient had had a sinus infection which was treated with antibiotics and had resolved.  His wife thinks that patient's son may have brought some back from school as he is started coughing since he had been home.  Patient had been complaining of a headache and congestion.   In the emergency department patient was noted to be afebrile with heart rate elevated up to 107, respirations 26, and all signs otherwise maintained.  CT scan of brain did not note any acute abnormality, but did potential of a small air-fluid level in the right frontal sinus/that superimposed mild right frontal sinusitis cannot be excluded.  Labs noted WBC 5.7, hemoglobin 9.7, creatinine 1.32, glucose documented as low as 49, ammonia 48.  Chest x-ray gave mild diffuse interstitial prominence with peribronchial cuffing mostly on the left lung concerning for bronchitis and probable developing left basilar bronchopneumonia.  RSV testing was  noted to be positive.  Had received 1 L normal saline IV fluids, Rocephin, and azithromycin.  Review of Systems: As mentioned in the history of present illness. All other systems reviewed and are negative. Past Medical History:  Diagnosis Date   Autoimmune disease ([place])    Bronchitis    Chronic hiccups    Diabetes mellitus without complication (HCC)    GERD (gastroesophageal reflux disease)    Hypothyroidism    Joint pain    Necrotizing myopathy    PTSD (post-traumatic stress disorder)    Past Surgical History:  Procedure Laterality Date   BIOPSY SHOULDER     Left   IR ANGIOGRAM PULMONARY BILATERAL SELECTIVE  07/22/2021   IR THROMBECT VENO MECH MOD SED  07/22/2021   IR US GUIDE VASC ACCESS LEFT  07/22/2021   RADIOLOGY WITH ANESTHESIA Right 07/22/2021   Procedure: IR WITH ANESTHESIA;  Surgeon: Radiologist, Medication, MD;  Location: [facility];  Service: Radiology;  Laterality: Right;   UPPER LEG SOFT TISSUE BIOPSY Right    Social History:  reports that he has never smoked. He has never used smokeless tobacco. He reports that he does not drink alcohol and does not use drugs.  Allergies  Allergen Reactions   Benazepril Cough   Gabapentin Swelling   Hydrocodone    Shellfish Allergy     Family History  Problem Relation Age of Onset   Hypertension Mother    Diabetes Mother    Arthritis Mother    Depression Mother    Heart attack Father  Hypertension Father     Prior to Admission medications   Medication Sig Start Date End Date Taking? Authorizing Provider  acarbose (PRECOSE) 100 MG tablet Take 100 mg by mouth 3 (three) times daily with meals.    [provider]  apixaban (ELIQUIS) 5 MG TABS tablet Take 1 tablet (5 mg total) by mouth 2 (two) times daily. 08/14/21   Loel Dubonnet, NP  atorvastatin (LIPITOR) 80 MG tablet Take 1 tablet (80 mg total) by mouth daily. Please attend scheduled appointment for additional refills. 03/02/22   Hilty, Nadean Corwin, MD  baclofen  (LIORESAL) 20 MG tablet TAKE 1 TABLET BY MOUTH TWICE A DAY 01/30/22   [name], DO  calcium carbonate (OS-CAL) 600 MG TABS tablet Take 600 mg by mouth daily.    [provider]  carvedilol (COREG) 12.5 MG tablet Take 1 tablet (12.5 mg total) by mouth 2 (two) times daily. 10/27/21 10/22/22  Loel Dubonnet, NP  Continuous Blood Gluc Sensor (FREESTYLE LIBRE 2 SENSOR) MISC Use to check glucose as needed.  Change every 14 days. Patient not taking: Reported on 01/30/2022 11/14/21   [name], DO  dapagliflozin propanediol (FARXIGA) 5 MG TABS tablet Take 5 mg by mouth daily.    [provider]  folic acid (FOLVITE) 1 MG tablet Take 1 mg by mouth daily.    [provider]  furosemide (LASIX) 20 MG tablet Take 1 tablet (20 mg total) by mouth as needed for fluid or edema. May take additional tablet ('20mg'$ ) as needed for weight gain of 2 pounds overnight or 5 pounds in 1 week. 01/09/22   Loel Dubonnet, NP  glipiZIDE (GLUCOTROL XL) 10 MG 24 hr tablet Take 10 mg by mouth 2 (two) times daily.     [provider]  insulin glargine (LANTUS) 100 UNIT/ML injection Inject 0.46-0.56 mLs (46-56 Units total) into the skin at bedtime. For now take 30 units twice a day and then increase to your usual dose as your oral intake improves. Increase both doses by 2units every day. Check your blood sugars at home three times daily. Call your doctor if sugar level is more than 300 or less than 80. Patient taking differently: Inject 42-44 Units into the skin 2 (two) times daily. Check your blood sugars at home three times daily. Call your doctor if sugar level is more than 300 or less than 80. 10/01/14   Bonnielee Haff, MD  Levothyroxine Sodium 88 MCG CAPS Take 88 mcg by mouth daily.    [provider]  loratadine (CLARITIN) 10 MG tablet daily. 12/12/21   [provider]  metFORMIN (GLUCOPHAGE-XR) 500 MG 24 hr tablet Take 1,500 mg by mouth daily. 07/24/21   [provider]  methotrexate (RHEUMATREX) 5 MG tablet Take 2 tablets (10 mg total) by mouth once a week. Caution: Chemotherapy. Protect from light. Take on fridays per patient.  OKAY TO RESUME FROM 10/08/14. Patient taking differently: Take 30 mg by mouth once a week. Caution: Chemotherapy. Protect from light. Take on fridays per patient.  6 Tabs once a week 10/01/14   Bonnielee Haff, MD  nystatin (MYCOSTATIN/NYSTOP) powder SMARTSIG:1 Application Topical 2-3 Times Daily 05/08/21   [provider]  pantoprazole (PROTONIX) 40 MG tablet Take 1 tablet (40 mg total) by mouth as needed. 01/09/22   Loel Dubonnet, NP  polyvinyl alcohol (LIQUIFILM TEARS) 1.4 % ophthalmic solution 1 drop as needed for dry eyes.    [provider]  predniSONE (  DELTASONE) 5 MG tablet Take 15 mg by mouth daily. 11/30/21   [provider]  QUEtiapine (SEROQUEL) 25 MG tablet Take 1 tablet (25 mg total) by mouth at bedtime. 02/13/22   [name], DO  sacubitril-valsartan (ENTRESTO) 24-26 MG Take 0.5 tablets by mouth 2 (two) times daily. 01/09/22   Loel Dubonnet, NP  sertraline (ZOLOFT) 100 MG tablet Take 100 mg by mouth daily.    [provider]  spironolactone (ALDACTONE) 25 MG tablet Take 0.5 tablets (12.5 mg total) by mouth daily. 10/09/21 10/04/22  Loel Dubonnet, NP  traMADol (ULTRAM) 50 MG tablet Take 100 mg by mouth 3 (three) times daily as needed for moderate pain. 08/04/19   [provider]  triamcinolone cream (KENALOG) 0.1 % Use 1 application twice daily as needed to red itchy areas below the face 09/30/18   Kennith Gain, MD    Physical Exam: Vitals:   03/11/22 1245 03/11/22 1330 03/11/22 1415 03/11/22 1426  BP: (!) 115/59 138/72 122/67   Pulse: 88 (!) 107 99   Resp: '18 20 17   '$ Temp:    98.7 F (37.1 C)  TempSrc:    Oral  SpO2: 96% 97% 95%   Weight:      Height:        Constitutional: Elderly male who appears to be in discomfort Eyes: PERRL,  crusting present around both eyelids. ENMT: Mucous membranes are moist.  Frontal and axillary tenderness to palpation. Neck: normal, supple,   Respiratory: Patient appears to have trouble breathing, but denies this when asked.  On the left more so than the right.  O2 saturations 90 to 94% while in the room on room air. Cardiovascular: Regular rate and rhythm, no murmurs / rubs / gallops.  At least 2+ pitting bilateral lower extremity edema present.  Tenderness palpation of the chest wall. Abdomen: no tenderness, no masses palpated.   Bowel sounds positive.  Musculoskeletal: no clubbing / cyanosis.  Tenderness palpation of all extremities Skin: no rashes, lesions, ulcers.   Neurologic: CN 2-12 grossly intact.  Appears able to move all extremities. Psychiatric: Alert, but confused and seems anxious.  Data Reviewed:  Reviewed labs, imaging and pertinent records as noted above in HPI.  EKG revealed normal sinus rhythm at 87 bpm with some ST wave changes.  Assessment and Plan: Acute metabolic encephalopathy Patient presents after being noted to be acutely altered and not his normal self.  CT scan of the brain did not note any acute abnormality have a drop in glucose down to 49, but had reported blood sugars prior at home.  Respiratory virus panel come back positive for RSV. -Admit to a telemetry bed -Delirium precautions -Neurochecks  RSV pneumonia  Acute.  Patient had reportedly been coughing for the last 2 days.  Chest x-ray noted concern for possible developing pneumonia.  Patient has been given 1 L normal saline IV fluids, Rocephin, and azithromycin due to concern for possible bacterial infection.   -Droplet and contact precautions -Incentive spirometry and flutter valve -Check procalcitonin -Continue Rocephin and azithromycin  -Mucinex -Breathing treatments as needed  Suspected sinusitis Patient complains of tenderness to palpation of the frontal sinus.  CT scan of the brain noted  concern for acute  sinusitis -Continue symptomatic treatment  Diabetes mellitus type 2 with hypoglycemia Acute.  On admission glucose noted to be as low as 49.  Patient is on glipizide 20 mg daily, acarbose 100 mg 3 times daily with meals, Lantus 44 units  every morning/40 units nightly, and metformin 1000 mg every morning,/500 mg at bedtime, -Hypoglycemic protocols -Hold glipizide, metformin, and acardose -CBGs every 4 hours -Plan to start a reduced insulin regimen once blood sugars maintain greater than 419  Diastolic congestive heart failure Acute on chronic.  Patient wife notes that he had been having increased leg swelling with reports of 8 to 10 pound weight gain.  Does report that Lasix had not been helping.  Last EF noted to be 55-60% with grade 1 diastolic dysfunction by echocardiogram 11/2021. -Strict I&O's and daily weights -Follow-up BNP -Lasix 40 mg IV twice daily  Macrocytic anemia Acute.  Hemoglobin 9.7 which appears around patient's baseline. -Recheck CBC tomorrow morning  Chronic kidney disease stage IIIa Creatinine 1.32 which appears around patient's baseline. -Continue to monitor kidney function with diuresis  Autoimmune myopathy  rheumatoid arthritis Patient is on methotrexate and chronic prednisone.  He is followed by rheumatology in the outpatient setting. -Check CK level -Continue prednisone and resume methotrexate in outpatient setting -Continue tramadol as needed for pain  Essential hypertension Home blood pressure regimen includes Coreg 12.5 mg twice daily, Entresto 24-26 mg half tablet twice daily, spironolactone 12.5 mg daily, and furosemide 20 mg daily as needed for fluid retention.   -Continue Coreg and Entresto -Held p.o. spironolactone and furosemide  History of DVT/PE on chronic anticoagulation Patient found to have acute pulmonary embolism with right lower extremity DVT and hypoxic respiratory failure.  Symptoms were thought possibly provoked due  to patient being on testosterone injections and/or his history of myositis and being less mobile. -Continue Eliquis  CAD Patient with prior history of NSTEMI.  Coronary CT in May noted no signs of significant stenosis in the proximal/proximal LAD. -Continue statin  Hypothyroidism TSH 0.869 -Continue levothyroxine   Hyperlipidemia -Continue atorvastatin  Obesity BMI 34.97 kg/m -Recommend dietary and lifestyle modifications in the outpatient setting   DVT prophylaxis: Eliquis Advance Care Planning:   Code Status: Full Code    Consults: None  Family Communication: Wife updated at bedside  Severity of Illness: The appropriate patient status for this patient is INPATIENT. Inpatient status is judged to be reasonable and necessary in order to provide the required intensity of service to ensure the patient's safety. The patient's presenting symptoms, physical exam findings, and initial radiographic and laboratory data in the context of their chronic comorbidities is felt to place them at high risk for further clinical deterioration. Furthermore, it is not anticipated that the patient will be medically stable for discharge from the hospital within 2 midnights of admission.    * I certify that at the point of admission it is my clinical judgment that the patient will require inpatient hospital care spanning beyond 2 midnights from the point of admission due to high intensity of service, high risk for further deterioration and high frequency of surveillance required.*  Author: Norval Morton, MD 03/11/2022 2:35 PM  For on call review [email or web address].

## 2022-03-11 NOTE — ED Notes (Signed)
Patient given 1 amp of Dextrose 50% (50 mL = 25 g)

## 2022-03-11 NOTE — ED Provider Notes (Signed)
[facility]ENC[facility] DEPARTMENT Provider Note   CSN: [id number] Arrival date & time: 03/11/22  1039     History  Chief Complaint  Patient presents with   Altered Mental Status    [name] is a 71 y.o. male.  HPI   71 year old male with medical history significant for DM 2, GERD, PTSD, hypothyroidism, OSA, CHF, PVD, HTN who presents to the emergency department with altered mental status.  The patient history was initially provided by EMS as the patient was unable provide in HPI.  Further history of present illness was obtained by the patient's wife who ultimately presented bedside.  She had an upper respiratory infection after their son came home from college and had been diagnosed with "walking pneumonia."  These symptoms improved after the patient was diagnosed with a concha mitten sinus infection and was prescribed doxycycline.  He completed a full course of the medication and initially had improvement in his symptoms.  Over the past 3 days, his cough has returned as his son recently came back from college with URI symptoms again.  He has had a productive cough and has progressively become more confused.  He does have a history of confusion associated with prior UTIs.  Per the patient's wife, he was speaking abnormally and talking nonsensically, some concern for possible hallucinations.  No recent falls or trauma.  His last normal was around Friday night and by Saturday he had become more progressively confused.  The patient's wife states that she had held his diabetic medications this morning and that he was notably hyperglycemic on blood glucose checks this morning.  He had not had much to eat all throughout the day yesterday and his diabetic medications were last administered last night.  No reported fevers or chills, no reported neurologic deficits.  He had had some symptoms of sinus congestion in addition to his cough but denied any headaches.  He had had no  difficulty with neck stiffness.  Additionally, the patient's wife states that he has had chronic lower extremity swelling and has been on Lasix but has not been helping.  He has not been taking the medication recently.  He is also been on spironolactone.  Home Medications Prior to Admission medications   Medication Sig Start Date End Date Taking? Authorizing Provider  acarbose (PRECOSE) 100 MG tablet Take 100 mg by mouth 3 (three) times daily with meals.   Yes [provider]  acetaminophen (TYLENOL) 500 MG tablet See admin instructions. Take (1,000 mg) by mouth in the morning and (500 mg) by mouth at bedtime   Yes [provider]  apixaban (ELIQUIS) 5 MG TABS tablet Take 1 tablet (5 mg total) by mouth 2 (two) times daily. 08/14/21  Yes Loel Dubonnet, NP  atorvastatin (LIPITOR) 80 MG tablet Take 1 tablet (80 mg total) by mouth daily. Please attend scheduled appointment for additional refills. Patient taking differently: Take 80 mg by mouth every evening. Please attend scheduled appointment for additional refills. 03/02/22  Yes Hilty, Nadean Corwin, MD  baclofen (LIORESAL) 20 MG tablet TAKE 1 TABLET BY MOUTH TWICE A DAY 01/30/22  Yes [name], DO  calcium carbonate (OS-CAL) 600 MG TABS tablet Take 600 mg by mouth daily.   Yes [provider]  carvedilol (COREG) 12.5 MG tablet Take 1 tablet (12.5 mg total) by mouth 2 (two) times daily. 10/27/21 10/22/22 Yes Loel Dubonnet, NP  folic acid (FOLVITE) 1 MG tablet Take 2 mg by mouth daily.  Yes [provider]  glipiZIDE (GLUCOTROL XL) 10 MG 24 hr tablet Take 20 mg by mouth daily.   Yes [provider]  insulin glargine (LANTUS) 100 UNIT/ML injection Inject 0.46-0.56 mLs (46-56 Units total) into the skin at bedtime. For now take 30 units twice a day and then increase to your usual dose as your oral intake improves. Increase both doses by 2units every day. Check your blood sugars at home three times daily.  Call your doctor if sugar level is more than 300 or less than 80. Patient taking differently: Inject 42-44 Units into the skin 2 (two) times daily. Check your blood sugars at home three times daily. Call your doctor if sugar level is more than 300 or less than 80.  Take 44 units subcutaneously in the morning and 42 units subcutaneously at night. 10/01/14  Yes Bonnielee Haff, MD  Levothyroxine Sodium 75 MCG CAPS Take 75 mcg by mouth daily.   Yes [provider]  loratadine (CLARITIN) 10 MG tablet Take 10 mg by mouth daily. 12/12/21  Yes [provider]  metFORMIN (GLUCOPHAGE-XR) 500 MG 24 hr tablet Take by mouth See admin instructions. Take (1,000 mg) by mouth in the morning and (500 mg) by mouth at night. 07/24/21  Yes [provider]  methotrexate (RHEUMATREX) 5 MG tablet Take 2 tablets (10 mg total) by mouth once a week. Caution: Chemotherapy. Protect from light. Take on fridays per patient.  OKAY TO RESUME FROM 10/08/14. Patient taking differently: Take 30 mg by mouth once a week. Caution: Chemotherapy. Protect from light. Take on fridays per patient.  6 Tabs once a week 10/01/14  Yes Bonnielee Haff, MD  Methylphenidate HCl Charl[facility] ER PO) Take by mouth. Dosing Unknown   Yes [provider]  nystatin (MYCOSTATIN/NYSTOP) powder Apply 1 Application topically 3 (three) times daily. 05/08/21  Yes [provider]  ONETOUCH VERIO test strip SMARTSIG:Via Meter 02/06/22  Yes [provider]  pantoprazole (PROTONIX) 40 MG tablet Take 1 tablet (40 mg total) by mouth as needed. 01/09/22  Yes Loel Dubonnet, NP  polyvinyl alcohol (LIQUIFILM TEARS) 1.4 % ophthalmic solution 1 drop as needed for dry eyes.   Yes [provider]  predniSONE (DELTASONE) 5 MG tablet Take 15 mg by mouth daily. 11/30/21  Yes [provider]  [place] -- Unknown Name -- Unknown Dosing   Yes [provider]  QUEtiapine  (SEROQUEL) 25 MG tablet Take 1 tablet (25 mg total) by mouth at bedtime. 02/13/22  Yes [name], DO  sacubitril-valsartan (ENTRESTO) 24-26 MG Take 0.5 tablets by mouth 2 (two) times daily. 01/09/22  Yes Loel Dubonnet, NP  spironolactone (ALDACTONE) 25 MG tablet Take 0.5 tablets (12.5 mg total) by mouth daily. 10/09/21 10/04/22 Yes Loel Dubonnet, NP  traMADol (ULTRAM) 50 MG tablet See admin instructions. Take one tablet (50 mg) by mouth in the morning and half tablet (25 mg) by mouth at night. 08/04/19  Yes [provider]  triamcinolone cream (KENALOG) 0.1 % Use 1 application twice daily as needed to red itchy areas below the face 09/30/18  Yes Padgett, Rae Halsted, MD  vitamin B-12 (CYANOCOBALAMIN) 100 MCG tablet Take 100 mcg by mouth daily.   Yes [provider]  Continuous Blood Gluc Sensor (FREESTYLE LIBRE 2 SENSOR) MISC Use to check glucose as needed.  Change every 14 days. Patient not taking: Reported on 01/30/2022 11/14/21   [name], DO  furosemide (LASIX) 20 MG  tablet Take 1 tablet (20 mg total) by mouth as needed for fluid or edema. May take additional tablet ('20mg'$ ) as needed for weight gain of 2 pounds overnight or 5 pounds in 1 week. Patient not taking: Reported on 03/11/2022 01/09/22   Loel Dubonnet, NP      Allergies    Benazepril, Gabapentin, Hydrocodone, and Shellfish allergy    Review of Systems   Review of Systems  Unable to perform ROS: Mental status change    Physical Exam Updated Vital Signs BP 122/67   Pulse 99   Temp 98.7 F (37.1 C) (Oral)   Resp 17   Ht '5\' 8"'$  (1.727 m)   Wt 104.3 kg   SpO2 95%   BMI 34.97 kg/m  Physical Exam Vitals and nursing note reviewed.  Constitutional:      Comments: GCS 13, somnolent but arousable to painful stimuli  HENT:     Head: Normocephalic and atraumatic.  Eyes:     Conjunctiva/sclera: Conjunctivae normal.     Pupils: Pupils are equal, round, and reactive to light.  Neck:      Comments: No nuchal rigidity Cardiovascular:     Rate and Rhythm: Normal rate and regular rhythm.     Pulses: Normal pulses.  Pulmonary:     Effort: Pulmonary effort is normal. No respiratory distress.  Abdominal:     General: There is no distension.     Tenderness: There is no guarding.  Musculoskeletal:        General: No deformity or signs of injury.     Cervical back: Neck supple.     Right lower leg: Edema present.     Left lower leg: Edema present.  Skin:    Findings: No lesion or rash.  Neurological:     General: No focal deficit present.     Mental Status: He is alert. He is disoriented.     Comments: Confused, GCS 13, no clear cranial nerve deficit, moving all 4 extremities in response to painful stimuli     ED Results / Procedures / Treatments   Labs (all labs ordered are listed, but only abnormal results are displayed) Labs Reviewed  RESPIRATORY PANEL BY PCR - Abnormal; Notable for the following components:      Result Value   Respiratory Syncytial Virus DETECTED (*)    All other components within normal limits  COMPREHENSIVE METABOLIC PANEL - Abnormal; Notable for the following components:   Glucose, Bld 54 (*)    Creatinine, Ser 1.32 (*)    Total Protein 6.1 (*)    GFR, Estimated 57 (*)    All other components within normal limits  CBC WITH DIFFERENTIAL/PLATELET - Abnormal; Notable for the following components:   RBC 3.05 (*)    Hemoglobin 9.7 (*)    HCT 30.6 (*)    MCV 100.3 (*)    nRBC 0.6 (*)    All other components within normal limits  URINALYSIS, ROUTINE W REFLEX MICROSCOPIC - Abnormal; Notable for the following components:   Glucose, UA >=500 (*)    Hgb urine dipstick MODERATE (*)    All other components within normal limits  AMMONIA - Abnormal; Notable for the following components:   Ammonia 48 (*)    All other components within normal limits  CBG MONITORING, ED - Abnormal; Notable for the following components:   Glucose-Capillary 49 (*)     All other components within normal limits  CBG MONITORING, ED - Abnormal; Notable for the following components:  Glucose-Capillary 134 (*)    All other components within normal limits  CBG MONITORING, ED - Abnormal; Notable for the following components:   Glucose-Capillary 119 (*)    All other components within normal limits  CULTURE, BLOOD (ROUTINE X 2)  CULTURE, BLOOD (ROUTINE X 2)  URINE CULTURE  LACTIC ACID, PLASMA  RAPID URINE DRUG SCREEN, HOSP PERFORMED  ETHANOL  T4, FREE  TSH  BRAIN NATRIURETIC PEPTIDE  PROCALCITONIN  HEMOGLOBIN A1C  I-STAT VENOUS BLOOD GAS, ED  TROPONIN I (HIGH SENSITIVITY)  TROPONIN I (HIGH SENSITIVITY)    EKG EKG Interpretation  Date/Time:  Sunday March 11 2022 11:08:05 EST Ventricular Rate:  87 PR Interval:  143 QRS Duration: 86 QT Interval:  360 QTC Calculation: 433 R Axis:   -3 Text Interpretation: Sinus rhythm Abnormal R-wave progression, early transition Nonspecific T abnormalities, lateral leads ST elevation, consider anterior injury Confirmed by Regan Lemming ([id number]) on 03/11/2022 11:13:38 AM  Radiology CT HEAD WO CONTRAST  Result Date: 03/11/2022 CLINICAL DATA:  Altered mental status. EXAM: CT HEAD WITHOUT CONTRAST TECHNIQUE: Contiguous axial images were obtained from the base of the skull through the vertex without intravenous contrast. RADIATION DOSE REDUCTION: This exam was performed according to the departmental dose-optimization program which includes automated exposure control, adjustment of the mA and/or kV according to patient size and/or use of iterative reconstruction technique. COMPARISON:  07/25/2021 and MRI from 08/13/2021 FINDINGS: Brain: The brainstem, cerebellum, cerebral peduncles, thalami, basal ganglia, basilar cisterns, and ventricular system appear within normal limits. No intracranial hemorrhage, mass lesion, or acute CVA. Periventricular white matter and corona radiata hypodensities favor chronic ischemic  microvascular white matter disease. Vascular: There is atherosclerotic calcification of the cavernous carotid arteries bilaterally. Skull: Unremarkable Sinuses/Orbits: Chronic bilateral maxillary, ethmoid, and frontal sinusitis. There is potentially a small air-fluid level in the right frontal sinus such that superimposed mild acute right frontal sinusitis is not excluded. Other: No supplemental non-categorized findings. IMPRESSION: 1. No acute intracranial findings. 2. Periventricular white matter and corona radiata hypodensities favor chronic ischemic microvascular white matter disease. 3. Chronic bilateral maxillary, ethmoid, and frontal sinusitis. There is potentially a small air-fluid level in the right frontal sinus such that superimposed mild acute right frontal sinusitis is not excluded. 4. Atherosclerosis. Electronically Signed   By: Van Clines M.D.   On: 03/11/2022 12:14   DG Chest Port 1 View  Result Date: 03/11/2022 CLINICAL DATA:  71 year old male with history of altered mental status. EXAM: PORTABLE CHEST 1 VIEW COMPARISON:  Chest x-ray 07/24/2021. FINDINGS: Lung volumes are low. Diffuse interstitial prominence and peribronchial cuffing, most evident throughout the left lung, particularly in the left lung base. Right lung appears relatively clear. No pleural effusions. No pneumothorax. No evidence of pulmonary edema. Heart size is borderline enlarged. Upper mediastinal contours are within normal limits. IMPRESSION: 1. Mild diffuse interstitial prominence and peribronchial cuffing, most severe in the left lung, particularly at the left lung base. Findings are concerning for bronchitis and probable developing left basilar bronchopneumonia. Electronically Signed   By: [name] M.D.   On: 03/11/2022 11:26    Procedures Procedures    Medications Ordered in ED Medications  sodium chloride flush (NS) 0.9 % injection 3 mL (has no administration in time range)  acetaminophen  (TYLENOL) tablet 650 mg (has no administration in time range)    Or  acetaminophen (TYLENOL) suppository 650 mg (has no administration in time range)  albuterol (PROVENTIL) (2.5 MG/3ML) 0.083% nebulizer solution 2.5 mg (has no administration in  time range)  ondansetron (ZOFRAN) tablet 4 mg (has no administration in time range)    Or  ondansetron (ZOFRAN) injection 4 mg (has no administration in time range)  guaiFENesin (MUCINEX) 12 hr tablet 600 mg (has no administration in time range)  dextrose 50 % solution 50 mL (has no administration in time range)  cefTRIAXone (ROCEPHIN) 2 g in sodium chloride 0.9 % 100 mL IVPB (has no administration in time range)  azithromycin (ZITHROMAX) 500 mg in sodium chloride 0.9 % 250 mL IVPB (has no administration in time range)  dextrose 50 % solution (50 mLs  Given 03/11/22 1138)  azithromycin (ZITHROMAX) 500 mg in sodium chloride 0.9 % 250 mL IVPB (0 mg Intravenous Stopped 03/11/22 1426)  cefTRIAXone (ROCEPHIN) 2 g in sodium chloride 0.9 % 100 mL IVPB (0 g Intravenous Stopped 03/11/22 1341)  lactated ringers bolus 1,000 mL (0 mLs Intravenous Stopped 03/11/22 1426)    ED Course/ Medical Decision Making/ A&P                           Medical Decision Making Amount and/or Complexity of Data Reviewed Labs: ordered. Radiology: ordered.  Risk Decision regarding hospitalization.    71 year old male with medical history significant for DM 2, GERD, PTSD, hypothyroidism, OSA, CHF, PVD, HTN who presents to the emergency department with altered mental status.  The patient history was initially provided by EMS as the patient was unable provide in HPI.  Further history of present illness was obtained by the patient's wife who ultimately presented bedside.  She had an upper respiratory infection after their son came home from college and had been diagnosed with "walking pneumonia."  These symptoms improved after the patient was diagnosed with a concha mitten sinus  infection and was prescribed doxycycline.  He completed a full course of the medication and initially had improvement in his symptoms.  Over the past 3 days, his cough has returned as his son recently came back from college with URI symptoms again.  He has had a productive cough and has progressively become more confused.  He does have a history of confusion associated with prior UTIs.  Per the patient's wife, he was speaking abnormally and talking nonsensically, some concern for possible hallucinations.  No recent falls or trauma.  His last normal was around Friday night and by Saturday he had become more progressively confused.  The patient's wife states that she had held his diabetic medications this morning and that he was notably hyperglycemic on blood glucose checks this morning.  He had not had much to eat all throughout the day yesterday and his diabetic medications were last administered last night.  No reported fevers or chills, no reported neurologic deficits.  He had had some symptoms of sinus congestion in addition to his cough but denied any headaches.  He had had no difficulty with neck stiffness.  Additionally, the patient's wife states that he has had chronic lower extremity swelling and has been on Lasix but has not been helping.  He has not been taking the medication recently.  He is also been on spironolactone.  On arrival, the patient was vitally stable, afebrile, borderline elevated heart rate pulse 93, not initially tachypneic, subsequently developed mild tachypnea, BP 103/69, saturating 97% on room air.  Sinus rhythm noted on cardiac telemetry.  Physical exam significant for 2+ pitting edema bilaterally in the lower extremities, no focal neurologic deficits, no nuchal rigidity.  Differential diagnosis includes  toxic metabolic encephalopathy, bacterial pneumonia, viral URI, pneumothorax, PE, electrolyte abnormality, less likely meningitis or encephalitis, UTI, thyroid dysfunction.  No  evidence for seizures.  No focal neurologic deficits to suggest CVA.  Work-up significant for initial CBG normal, repeat collected and found to be hypoglycemic to 49.  The patient has not had any thing to eat or drink for the last day but has not also not taken his diabetic medicines.  He was administered D50 with subsequent improvement in mental status but he remains mildly encephalopathic.  Urinalysis was performed which was negative for UTI.  An RVP revealed evidence of RSV, CMP with hyperglycemia as noted previously, evidence of elevated creatinine to 1.32, ammonia mildly elevated at 48, no asterixis on exam, lactic acid normal at 1.7, CBC without leukocytosis.  Chest x-ray was performed which revealed the following: IMPRESSION:  1. Mild diffuse interstitial prominence and peribronchial cuffing,  most severe in the left lung, particularly at the left lung base.  Findings are concerning for bronchitis and probable developing left  basilar bronchopneumonia.   CT Head: IMPRESSION:  1. No acute intracranial findings.  2. Periventricular white matter and corona radiata hypodensities  favor chronic ischemic microvascular white matter disease.  3. Chronic bilateral maxillary, ethmoid, and frontal sinusitis.  There is potentially a small air-fluid level in the right frontal  sinus such that superimposed mild acute right frontal sinusitis is  not excluded.  4. Atherosclerosis.    The patient's acute encephalopathy in the setting of a viral illness with attentional concern for developing bacterial pneumonia, plan will be for medical admission for observation.  Given the chest x-ray read of concern for potential developing bacterial pneumonia, the patient was covered with IV Rocephin and azithromycin.  He was administered a 1 L IV fluid bolus.  Patient was intermittently borderline tachycardic, intermittently borderline tachypneic but not fully meeting SIRS criteria.  Patient hemodynamically stable at  time of admission.  Dr. Tamala Julian of hospitalist medicine subsequently excepted the patient at admission.   Final Clinical Impression(s) / ED Diagnoses Final diagnoses:  Altered mental status, unspecified altered mental status type  Community acquired pneumonia of left lower lobe of lung  Hypoglycemia    Rx / DC Orders ED Discharge Orders     None         Regan Lemming, MD 03/11/22 1535

## 2022-03-11 NOTE — ED Notes (Signed)
Pt's condom catheter leaked. Pt changed, new condom cath applied.

## 2022-03-11 NOTE — ED Notes (Signed)
Pt incontinent of urine, wet clothes removed, clean pads placed, bed linens changed, condom catheter in place.

## 2022-03-11 NOTE — ED Triage Notes (Signed)
Pt BIB EMS from home for AMS. LKW Friday night, when he woke up at 7am on Saturday he was altered. Patient has a history of DVT and MI but no CVA history. History of prior UTI. Per family, pt was hallucinating. No falls or recent head injuries, usually A&O x4.   EMS Vitals 100/68 HR 90s CBG 129 RR 18

## 2022-03-12 ENCOUNTER — Telehealth (HOSPITAL_COMMUNITY): Payer: Self-pay | Admitting: Pharmacy Technician

## 2022-03-12 ENCOUNTER — Other Ambulatory Visit (HOSPITAL_COMMUNITY): Payer: Self-pay

## 2022-03-12 DIAGNOSIS — G9341 Metabolic encephalopathy: Secondary | ICD-10-CM | POA: Diagnosis not present

## 2022-03-12 LAB — BASIC METABOLIC PANEL
Anion gap: 12 (ref 5–15)
BUN: 23 mg/dL (ref 8–23)
CO2: 24 mmol/L (ref 22–32)
Calcium: 9.2 mg/dL (ref 8.9–10.3)
Chloride: 105 mmol/L (ref 98–111)
Creatinine, Ser: 1.28 mg/dL — ABNORMAL HIGH (ref 0.61–1.24)
GFR, Estimated: 59 mL/min — ABNORMAL LOW (ref >60)
Glucose, Bld: 246 mg/dL — ABNORMAL HIGH (ref 70–99)
Potassium: 4.7 mmol/L (ref 3.5–5.1)
Sodium: 141 mmol/L (ref 135–145)

## 2022-03-12 LAB — CBC
HCT: 25.9 % — ABNORMAL LOW (ref 39.0–52.0)
Hemoglobin: 8.6 g/dL — ABNORMAL LOW (ref 13.0–17.0)
MCH: 32.5 pg (ref 26.0–34.0)
MCHC: 33.2 g/dL (ref 30.0–36.0)
MCV: 97.7 fL (ref 80.0–100.0)
Platelets: 146 10*3/uL — ABNORMAL LOW (ref 150–400)
RBC: 2.65 MIL/uL — ABNORMAL LOW (ref 4.22–5.81)
RDW: 14.5 % (ref 11.5–15.5)
WBC: 4.4 10*3/uL (ref 4.0–10.5)
nRBC: 0 % (ref 0.0–0.2)

## 2022-03-12 LAB — GLUCOSE, CAPILLARY
Glucose-Capillary: 304 mg/dL — ABNORMAL HIGH (ref 70–99)
Glucose-Capillary: 303 mg/dL — ABNORMAL HIGH (ref 70–99)

## 2022-03-12 LAB — AMMONIA: Ammonia: 29 umol/L (ref 9–35)

## 2022-03-12 LAB — CBG MONITORING, ED
Glucose-Capillary: 256 mg/dL — ABNORMAL HIGH (ref 70–99)
Glucose-Capillary: 233 mg/dL — ABNORMAL HIGH (ref 70–99)
Glucose-Capillary: 294 mg/dL — ABNORMAL HIGH (ref 70–99)

## 2022-03-12 LAB — URINE CULTURE: Culture: NO GROWTH

## 2022-03-12 MED ORDER — INSULIN GLARGINE-YFGN 100 UNIT/ML ~~LOC~~ SOLN
20.0000 [IU] | Freq: Every day | SUBCUTANEOUS | Status: DC
  Administered 2022-03-12: 20 [IU] via SUBCUTANEOUS
  Filled 2022-03-12 (×2): qty 0.2

## 2022-03-12 MED ORDER — GUAIFENESIN-DM 100-10 MG/5ML PO SYRP
5.0000 mL | ORAL_SOLUTION | ORAL | Status: DC | PRN
  Administered 2022-03-13 – 2022-03-15 (×7): 5 mL via ORAL
  Filled 2022-03-12 (×7): qty 5

## 2022-03-12 MED ORDER — MORPHINE SULFATE (PF) 2 MG/ML IV SOLN
1.0000 mg | INTRAVENOUS | Status: AC | PRN
  Administered 2022-03-14: 1 mg via INTRAVENOUS
  Filled 2022-03-12: qty 1

## 2022-03-12 MED ORDER — LIVING WELL WITH DIABETES BOOK
Freq: Once | Status: AC
  Filled 2022-03-12: qty 1

## 2022-03-12 MED ORDER — INSULIN ASPART 100 UNIT/ML IJ SOLN
0.0000 [IU] | Freq: Three times a day (TID) | INTRAMUSCULAR | Status: DC
  Administered 2022-03-12: 7 [IU] via SUBCUTANEOUS
  Administered 2022-03-12: 5 [IU] via SUBCUTANEOUS
  Administered 2022-03-13: 2 [IU] via SUBCUTANEOUS
  Administered 2022-03-13: 5 [IU] via SUBCUTANEOUS
  Administered 2022-03-13: 15 [IU] via SUBCUTANEOUS

## 2022-03-12 MED ORDER — LOSARTAN POTASSIUM 50 MG PO TABS
25.0000 mg | ORAL_TABLET | Freq: Every day | ORAL | Status: DC
  Administered 2022-03-13 – 2022-03-14 (×2): 25 mg via ORAL
  Filled 2022-03-12 (×3): qty 1

## 2022-03-12 MED ORDER — INSULIN ASPART 100 UNIT/ML IJ SOLN
0.0000 [IU] | Freq: Every day | INTRAMUSCULAR | Status: DC
  Administered 2022-03-12: 4 [IU] via SUBCUTANEOUS

## 2022-03-12 NOTE — Progress Notes (Signed)
PROGRESS NOTE  [name]  DOB: Nov 09, 1950  PCP: [name], DO DEY:[id number]  DOA: 03/11/2022  LOS: 1 day  Hospital Day: 2  Brief narrative: [name] is a 71 y.o. male with PMH significant for DM2, HTN, diastolic CHF, autoimmune myopathy/myositis, hypothyroidism, PE/DVT, and CKD stage III . 11/12, patient was brought to the ED from home for altered mental status.  The day prior, they had driven their son back to [employer] and on the way back she noticed that he started speaking in third person. Prior to their son getting home patient had had a sinus infection which was treated with antibiotics and had resolved.  His wife thinks that patient's son may have brought some back from school as he is started coughing since he had been home.  Patient had been complaining of a headache and congestion.  In the ED, patient was afebrile, heart rate 90s, blood pressure 100s Labs with WC count normal, hemoglobin low at 9.7, creatinine elevated to 1.32, ammonia slightly elevated to 48, CK total elevated 1500, troponin elevated 35. CT head did not show any acute intracranial findings.  Showed periventricular white matter coronary due to hypodensities Favor chronic ischemic microvascular white matter disease.  It also showed chronic bilateral maxillary ethmoid and frontal sinusitis, potentially a small air-fluid level of the right frontal sinus suggestive of mild acute right frontal sinusitis. Chest x-ray showed mild diffuse interstitial prominence and peribronchial cuffing, most severe at the left lung base.  Findings concerning for bronchitis and probable developing left basilar bronchopneumonia. RVP positive for RSV. Patient was started on IV Rocephin, azithromycin Admitted to hospitalist service  Subjective: Patient was seen and examined this morning.  Pleasant elderly African-American male. Propped up in bed.  Not in distress.  Wife at bedside. Chart reviewed No fever, hemodynamically stable.   Breathing on room air Blood sugar level has been brittle resume as low as 49 to as high as 256 in a span of 24 hours.  Assessment and plan: Acute metabolic encephalopathy Presented with altered mental status  CT head did not show any acute intracranial abnormality.  Altered mentation likely due to pneumonia.  See management pneumonia below.  Mental status gradually improving.  RSV pneumonia Left basilar bronchopneumonia After his son returned home from school, patient started to have cough, headache and congestion. RVP positive for RSV Chest x-ray as above suggestive of left basilar bronchopneumonia. WBC count, lactic acid normal.  Procalcitonin slightly elevated. Currently on IV Rocephin, IV azithromycin. Continue Mucinex, bronchodilators Recent Labs  Lab 03/11/22 1048 03/11/22 1900 03/12/22 0845  WBC 5.2  --  4.4  LATICACIDVEN 1.7  --   --   PROCALCITON  --  0.29  --     Recent sinusitis Per history, few days prior patient had had a sinus infection which was treated with antibiotics and had resolved.   Patient complains of tenderness to palpation of the frontal sinus.   CT scan of the brain noted concern for acute sinusitis.  Already on antibiotics Continue symptomatic treatment as well.  Type 2 diabetes mellitus with hypoglycemia A1c 9 on 03/11/2022 On admission, glucose level was noted to be low at 49.  Blood glucose level fluctuating as high as 256 in the last 24 hours. PTA on Lantus 44 units a.m./44 units p.m., glipizide 20 mg daily, metformin 1000 mg a.m/500 mg p.m., acarbose 100 mg 3 times daily For this morning, will give Lantus 20 units.  Hold other meds. Start sliding scale insulin with Accu-Cheks  Recent Labs  Lab 03/11/22 1859 03/11/22 2210 03/12/22 0302 03/12/22 0850 03/12/22 1217  GLUCAP 139* 239* 256* 233* 294*   Acute exacerbation of diastolic CHF Essential hypertension Wife reported grossly worsening lip swelling and 8 to 10 pound weight gain  recently.   PTA on Coreg 12.5 mg twice daily, Entresto 24-26 mg half tablet twice daily, spironolactone 12.5 mg daily, and furosemide 20 mg daily as needed for fluid retention.   Last EF noted to be 55-60% with grade 1 diastolic dysfunction by echocardiogram 11/2021. Currently on Lasix 40 mg IV twice daily.  Continue Coreg and Entresto.  Others have been held. Obtain cardiology consultation. Net IO Since Admission: 1,350.77 mL [03/12/22 1256] Continue to monitor for daily intake output, weight, blood pressure, BNP, renal function and electrolytes. Recent Labs  Lab 03/11/22 1048 03/11/22 1900 03/12/22 0845  BNP  --  102.8*  --   BUN 23  --  23  CREATININE 1.32*  --  1.28*  K 4.5  --  4.7   Chronic kidney disease stage IIIa Creatinine 1.32 which appears around patient's baseline. Continue to monitor kidney function with diuresis Recent Labs    07/25/21 0243 07/26/21 0344 07/27/21 0310 08/09/21 0000 09/05/21 1037 10/09/21 1123 01/09/22 1304 01/30/22 0000 03/11/22 1048 03/12/22 0845  BUN '19 17 14 24 24 20 26 13 23 23  '$ CREATININE 1.37* 1.46* 1.39* 1.18 1.14 1.11 1.35* 1.20 1.32* 1.28*   CAD H/o NSTEMI HLD Coronary CT in May noted no signs of significant stenosis in the proximal/proximal LAD. Continue Eliquis and statin.   Autoimmune myopathy  rheumatoid arthritis Patient is on methotrexate and chronic prednisone.  He is followed by rheumatology in the outpatient setting. CK level elevated to 1500.  Unclear baseline. Continue prednisone and resume methotrexate in outpatient setting Continue tramadol as needed for pain Recent Labs  Lab 03/11/22 1900  CKTOTAL 1,504*   Elevated ammonia level Unclear etiology.  No finding of liver cirrhosis and the most recent imaging from March 2023 Not on Depakote.  Repeat ammonia level this morning showed an improvement to 29. Recent Labs  Lab 03/11/22 1048 03/12/22 0845  AMMONIA 48* 29    History of DVT/PE on chronic  anticoagulation In March 2023, patient had saddle pulm embolism, right heart strain and right lower EXTR DVT for which she underwent thrombectomy by IR.  VTE was thought possibly provoked due to patient being on testosterone injections and impaired mobility due to myositis. Chronically anticoagulated with Eliquis.  Continue the same.  Macrocytic anemia Hemoglobin 9.7 which appears around patient's baseline. -Recheck CBC tomorrow morning Recent Labs    08/09/21 0000 01/09/22 1304 01/30/22 0000 03/11/22 1048 03/12/22 0845  HGB 13.3 10.6* 10.0* 9.7* 8.6*  MCV 96.5 97 97.1 100.3* 97.7  VITAMINB12  --   --  207  --   --   FOLATE  --   --  >24.0  --   --   FERRITIN  --   --  316  --   --   TIBC  --   --  230*  --   --   IRON  --   --  108  --   --     Hypothyroidism TSH 0.869 Continue levothyroxine   Morbid obesity  -Body mass index is 34.97 kg/m. Patient has been advised to make an attempt to improve diet and exercise patterns to aid in weight loss.  Goals of care   Code Status: Full Code  Mobility: Encourage ambulation.  PT eval ordered  Skin assessment:     Nutritional status:  Body mass index is 34.97 kg/m.          Diet:  Diet Order             Diet heart healthy/carb modified Room service appropriate? Yes; Fluid consistency: Thin  Diet effective now                   DVT prophylaxis:   apixaban (ELIQUIS) tablet 5 mg   Antimicrobials: Rocephin, azithromycin Fluid: None Consultants: Cardiology called Family Communication: Wife at bedside  Status is: Inpatient  Continue in-hospital care because: She is grossly volume overloaded, needs IV diuresis Level of care: Telemetry Medical   Dispo: The patient is from: Home              Anticipated d/c is to: Pending clinical course              Patient currently is not medically stable to d/c.   Difficult to place patient No     Infusions:   azithromycin     cefTRIAXone (ROCEPHIN)  IV 2 g  (03/12/22 1224)    Scheduled Meds:  apixaban  5 mg Oral BID   atorvastatin  80 mg Oral QPM   carvedilol  12.5 mg Oral BID   cycloSPORINE  1 drop Both Eyes BID   furosemide  40 mg Intravenous BID   guaiFENesin  600 mg Oral BID   insulin aspart  0-5 Units Subcutaneous QHS   insulin aspart  0-9 Units Subcutaneous TID WC   insulin glargine-yfgn  20 Units Subcutaneous Daily   levothyroxine  75 mcg Oral Daily   living well with diabetes book   Does not apply Once   loratadine  10 mg Oral Daily   predniSONE  15 mg Oral Daily   QUEtiapine  25 mg Oral QHS   sacubitril-valsartan  0.5 tablet Oral BID   sodium chloride flush  3 mL Intravenous Q12H    PRN meds: acetaminophen **OR** acetaminophen, albuterol, dextrose, morphine injection, ondansetron **OR** ondansetron (ZOFRAN) IV, pantoprazole, polyvinyl alcohol, traMADol, triamcinolone cream   Antimicrobials: Anti-infectives (From admission, onward)    Start     Dose/Rate Route Frequency Ordered Stop   03/12/22 1300  cefTRIAXone (ROCEPHIN) 2 g in sodium chloride 0.9 % 100 mL IVPB        2 g 200 mL/hr over 30 Minutes Intravenous Every 24 hours 03/11/22 1501 03/16/22 1259   03/12/22 1300  azithromycin (ZITHROMAX) 500 mg in sodium chloride 0.9 % 250 mL IVPB        500 mg 250 mL/hr over 60 Minutes Intravenous Every 24 hours 03/11/22 1501     03/11/22 1300  cefTRIAXone (ROCEPHIN) 1 g in sodium chloride 0.9 % 100 mL IVPB  Status:  Discontinued        1 g 200 mL/hr over 30 Minutes Intravenous  Once 03/11/22 1257 03/11/22 1257   03/11/22 1300  azithromycin (ZITHROMAX) 500 mg in sodium chloride 0.9 % 250 mL IVPB        500 mg 250 mL/hr over 60 Minutes Intravenous  Once 03/11/22 1257 03/11/22 1426   03/11/22 1300  cefTRIAXone (ROCEPHIN) 2 g in sodium chloride 0.9 % 100 mL IVPB        2 g 200 mL/hr over 30 Minutes Intravenous  Once 03/11/22 1257 03/11/22 1341       Objective: Vitals:   03/12/22 1206 03/12/22  1225  BP: (!) 162/104   Pulse:  66   Resp: 15   Temp: 98.2 F (36.8 C) 97.8 F (36.6 C)  SpO2: 97%     Intake/Output Summary (Last 24 hours) at 03/12/2022 1256 Last data filed at 03/11/2022 1426 Gross per 24 hour  Intake 1350.77 ml  Output --  Net 1350.77 ml   Filed Weights   03/11/22 1100  Weight: 104.3 kg   Weight change:  Body mass index is 34.97 kg/m.   Physical Exam: General exam: Pleasant, elderly African-American Skin: No rashes, lesions or ulcers. HEENT: Atraumatic, normocephalic, no obvious bleeding Lungs: Diminished air entry in both bases.  No crackles CVS: Regular rate and rhythm, no murmur GI/Abd soft, nontender, nondistended, bowel sound present CNS: Alert, awake, slow to but able to answer orientation questions.  Not yet back to baseline Psychiatry: Mood appropriate Extremities: 1+ to 2+ bilateral pedal edema.  Tenderness all over because of myositis  Data Review: I have personally reviewed the laboratory data and studies available.  F/u labs ordered Unresulted Labs (From admission, onward)     Start     Ordered   03/13/22 4158  Basic metabolic panel  Daily at 5am,   R      03/12/22 1254   03/13/22 0500  CBC with Differential/Platelet  Daily at 5am,   R      03/12/22 1254   03/13/22 0500  CK  Tomorrow morning,   R        03/12/22 1254            Signed, Terrilee Croak, MD [facility] 03/12/2022

## 2022-03-12 NOTE — Telephone Encounter (Signed)
Patient Advocate Encounter   Received notification that prior authorization for Dexcom G6 Transmitter is required.   PA submitted on 03/12/2022 Key [id number] Status is pending       Lyndel Safe, [place] Patient Advocate Specialist [place] Patient Advocate Team Direct Number: [phone]  Fax: [phone]

## 2022-03-12 NOTE — Inpatient Diabetes Management (Signed)
Inpatient Diabetes Program Recommendations  AACE/ADA: New Consensus Statement on Inpatient Glycemic Control (2015)  Target Ranges:  Prepandial:   less than 140 mg/dL      Peak postprandial:   less than 180 mg/dL (1-2 hours)      Critically ill patients:  140 - 180 mg/dL   Lab Results  Component Value Date   GLUCAP 294 (H) 03/12/2022   HGBA1C 9.0 (H) 03/11/2022    Review of Glycemic Control  Diabetes history: DM2 Outpatient Diabetes medications: Lantus 44 units am & 42 units pm, Glucotrol 20 mg qd, Metformin 1 gm am & 500 mg pm Current orders for Inpatient glycemic control: Semglee 20 units qd, Novolog 0-9 units tid, 0-5 units hs, Prednisone 15 q am  Inpatient Diabetes Program Recommendations:   Met with wife and patient @ bedside in ED and discussed diabetes management This patient has [employer] insurance and needs a CGM.  He states one sensor Libre cost > $70. He takes insulin bid.  Pharmacy is comparing prices of CGM between [facility] and [facility] for pt. Will plan to place a sensor prior to discharge if ok with MD. Patient has had poor appetite for the past several  months after starting on Metformin. Wife tried to decrease to 500 mg qd but patient continues to not tolerate by poor appetite.  Reviewed hypoglycemia protocol with patient and wife and patient verbalized understanding. Will follow during hospitalization.  Thank you, Nani Gasser. Cleatis Fandrich, RN, MSN, CDE  Diabetes Coordinator Inpatient Glycemic Control Team Team Pager [phone] (8am-5pm) 03/12/2022 12:51 PM

## 2022-03-12 NOTE — Consult Note (Addendum)
Cardiology Consultation   Patient ID: [name] MRN: [id number]; DOB: 1950-07-09  Admit date: 03/11/2022 Date of Consult: 03/12/2022  PCP:  [name], [place] Providers Cardiologist:  Pixie Casino, MD   {  Patient Profile:   [name] is a 71 y.o. male with a history of non-obstructive CAD on coronary CTA in 08/2021,  chronic HFrEF with EF as low as 35-40% in 06/2021 but normalized to 55-60% on last Echo in 11/2021, hypertension, type 2 diabetes mellitus, hypothyroidism, prior DVT/PE, CKD stage III, autoimmune myositis, necrotizing myopathy, and PTSD who is being seen 03/12/2022 for the evaluation of acute on chronic CHF at the request of Dr. Pietro Cassis.  History of Present Illness:   [name] is a 71 year old male with the above history who is followed by Dr. Debara Pickett.  Patient was admitted in 06/2021 for acute saddle PE with right heart strain on CT and popliteal vein DVT.  He ultimately required thrombectomy for his PE.  He was also found to have new acute combined CHF.  Showed LVEF of 35-40% with severe hypokinesis of the entire inferior septal wall, inferior wall, apical segment, and inferior lateral wall as well as grade 1 diastolic dysfunction.  RV was poorly visualized but systolic function appeared normal.  Started on GDMT with Coreg, hydralazine, and Imdur.  He was not started on ACE/ARB/ARNI due to renal function.  Plan was for outpatient ischemic evaluation once he had recovered from his acute thromboembolic event.  He ultimately underwent a coronary CTA in 08/2021 which showed coronary calcium score of 129 Agatston units (64th percentile for age and sex) and mild stenosis (25-49% of the ostial to proximal LAD.  FFR was negative.  Repeat Echo in 11/2021 showed LVEF of 55-60% with mild hypokinesis of the apical and septal walls as well as grade 1 diastolic dysfunction.  RV was normal.  Also showed mild dilatation of the ascending aorta measuring 42 mm.  Was last seen  by Laurann Montana, NP, in 12/2021 at which time he reported occasional lightheadedness first thing in the morning but was otherwise doing well from a cardiac standpoint.  Lasix was reduced to as needed dosing and he was advised to reduce Entresto to 1/2 tablet (of the 24-'26mg'$  tablet) twice daily due to soft BP of 104/50. He also reported bilateral knee pain as well as right shoulder pain and requested referral to orthopedics which was was placed. ABIs were ordered to make sure this was not PAD and showed non-compressible ABIs on the right but normal ABIs on the left.  Patient presented to the ED on 03/11/2022 with altered mental status and was ultimately found to have hypoglycemia with glucose of 49 and RSV pneumonia. EKG showed normal sinus rhythm with no acute ischemic changes. High-sensitivity troponin negative. BNP minimally elevated at 102.8. Chest x-ray showed mild diffuse interstitial prominence and peribronchial cuffing (most severe in the left lung) particularly at the left lung base. Findings concerning for bronchitis and probable developing left basilar bronchopneumonia. WBC 5.2, Hgb 9.7, Plts 168. Na 142, K 4.5, Glucose 54, BUN 23, Cr 1.32. LFTs normal. Ammonia elevated at 48. Lactic acid 1.7. Patient was started on IV antibiotics. Cardiology was consulted due to concerns for acute on chronic CHF.   At the time of this evaluation, patient is resting comfortably in no acute distress. His mental status is back to normal after initiation of antibiotics - he is no longer confused. He first developed URI  symptoms (non-productive cough, nasal congestion/drainage) about 2 weeks ago after son came home from college with walking pneumonia. He was diagnosed with a sinus infection and treated with antibiotics. He initially got better but then his son came back home a week ago with URI symptoms and patient again got sick with similar symptoms. He started having a productive cough and nasal drainage on 03/07/2022  and then started having confusion on the evening of 03/10/2022. However, he denies any shortness of breath with this. He reports waking up at night due to post-nasal drip but no orthopnea or PND. He does have chronic lower extremity edema which his wife states he has had for about 2 years but it has been getting worse lately. He denies any real improvement with his Lasix or Spironolactone. He also reports some lip swelling and feeling that his tongue was "thick" recently and was concerned this may be due to his Entresto. He reports very atypical intermittent chest pain that he describes as a burning sensation on the left side of his chest. He states he has had this since the time of his PE. However, this is clearly reproducible with palpation of this area. He also has reproducible chest wall pain on the right side of his chest. He denies any significant palpitations. He does have some intermittent dizziness. There was initial concern that this was secondary to soft BP which is why his Delene Loll was decreased. He does not report significant improvement with this. He has had a couple of mechanical falls recently due to significant weakness (he has a history of autoimmune myopathy/ myositis).   Past Surgical History:  Procedure Laterality Date   BIOPSY SHOULDER     Left   IR ANGIOGRAM PULMONARY BILATERAL SELECTIVE  07/22/2021   IR THROMBECT VENO MECH MOD SED  07/22/2021   IR US GUIDE VASC ACCESS LEFT  07/22/2021   RADIOLOGY WITH ANESTHESIA Right 07/22/2021   Procedure: IR WITH ANESTHESIA;  Surgeon: Radiologist, Medication, MD;  Location: [facility];  Service: Radiology;  Laterality: Right;   UPPER LEG SOFT TISSUE BIOPSY Right      Home Medications:  Prior to Admission medications   Medication Sig Start Date End Date Taking? Authorizing Provider  acarbose (PRECOSE) 100 MG tablet Take 100 mg by mouth 3 (three) times daily with meals.   Yes [provider]  acetaminophen (TYLENOL) 500 MG tablet See admin  instructions. Take (1,000 mg) by mouth in the morning and (500 mg) by mouth at bedtime   Yes [provider]  apixaban (ELIQUIS) 5 MG TABS tablet Take 1 tablet (5 mg total) by mouth 2 (two) times daily. 08/14/21  Yes Loel Dubonnet, NP  atorvastatin (LIPITOR) 80 MG tablet Take 1 tablet (80 mg total) by mouth daily. Please attend scheduled appointment for additional refills. Patient taking differently: Take 80 mg by mouth every evening. Please attend scheduled appointment for additional refills. 03/02/22  Yes Hilty, Nadean Corwin, MD  baclofen (LIORESAL) 20 MG tablet TAKE 1 TABLET BY MOUTH TWICE A DAY 01/30/22  Yes [name], DO  calcium carbonate (OS-CAL) 600 MG TABS tablet Take 600 mg by mouth daily.   Yes [provider]  carvedilol (COREG) 12.5 MG tablet Take 1 tablet (12.5 mg total) by mouth 2 (two) times daily. 10/27/21 10/22/22 Yes Loel Dubonnet, NP  folic acid (FOLVITE) 1 MG tablet Take 2 mg by mouth daily.   Yes [provider]  glipiZIDE (GLUCOTROL XL) 10 MG 24 hr tablet Take  20 mg by mouth daily.   Yes [provider]  insulin glargine (LANTUS) 100 UNIT/ML injection Inject 0.46-0.56 mLs (46-56 Units total) into the skin at bedtime. For now take 30 units twice a day and then increase to your usual dose as your oral intake improves. Increase both doses by 2units every day. Check your blood sugars at home three times daily. Call your doctor if sugar level is more than 300 or less than 80. Patient taking differently: Inject 42-44 Units into the skin 2 (two) times daily. Check your blood sugars at home three times daily. Call your doctor if sugar level is more than 300 or less than 80.  Take 44 units subcutaneously in the morning and 42 units subcutaneously at night. 10/01/14  Yes Bonnielee Haff, MD  Levothyroxine Sodium 75 MCG CAPS Take 75 mcg by mouth daily.   Yes [provider]  loratadine (CLARITIN) 10 MG tablet Take 10 mg by mouth daily. 12/12/21   Yes [provider]  metFORMIN (GLUCOPHAGE-XR) 500 MG 24 hr tablet Take by mouth See admin instructions. Take (1,000 mg) by mouth in the morning and (500 mg) by mouth at night. 07/24/21  Yes [provider]  methotrexate (RHEUMATREX) 5 MG tablet Take 2 tablets (10 mg total) by mouth once a week. Caution: Chemotherapy. Protect from light. Take on fridays per patient.  OKAY TO RESUME FROM 10/08/14. Patient taking differently: Take 30 mg by mouth once a week. Caution: Chemotherapy. Protect from light. Take on fridays per patient.  6 Tabs once a week 10/01/14  Yes Bonnielee Haff, MD  Methylphenidate HCl Charlaine Dalton ER PO) Take by mouth. Dosing Unknown   Yes [provider]  nystatin (MYCOSTATIN/NYSTOP) powder Apply 1 Application topically 3 (three) times daily. 05/08/21  Yes [provider]  ONETOUCH VERIO test strip SMARTSIG:Via Meter 02/06/22  Yes [provider]  pantoprazole (PROTONIX) 40 MG tablet Take 1 tablet (40 mg total) by mouth as needed. 01/09/22  Yes Loel Dubonnet, NP  polyvinyl alcohol (LIQUIFILM TEARS) 1.4 % ophthalmic solution 1 drop as needed for dry eyes.   Yes [provider]  predniSONE (DELTASONE) 5 MG tablet Take 15 mg by mouth daily. 11/30/21  Yes [provider]  Abanda -- Unknown Name -- Unknown Dosing   Yes [provider]  QUEtiapine (SEROQUEL) 25 MG tablet Take 1 tablet (25 mg total) by mouth at bedtime. 02/13/22  Yes [name], DO  sacubitril-valsartan (ENTRESTO) 24-26 MG Take 0.5 tablets by mouth 2 (two) times daily. 01/09/22  Yes Loel Dubonnet, NP  spironolactone (ALDACTONE) 25 MG tablet Take 0.5 tablets (12.5 mg total) by mouth daily. 10/09/21 10/04/22 Yes Loel Dubonnet, NP  traMADol (ULTRAM) 50 MG tablet See admin instructions. Take one tablet (50 mg) by mouth in the morning and half tablet (25 mg) by mouth at night. 08/04/19  Yes [provider]   triamcinolone cream (KENALOG) 0.1 % Use 1 application twice daily as needed to red itchy areas below the face 09/30/18  Yes Padgett, Rae Halsted, MD  vitamin B-12 (CYANOCOBALAMIN) 100 MCG tablet Take 100 mcg by mouth daily.   Yes [provider]  Continuous Blood Gluc Sensor (FREESTYLE LIBRE 2 SENSOR) MISC Use to check glucose as needed.  Change every 14 days. Patient not taking: Reported on 01/30/2022 11/14/21   [name], DO  furosemide (LASIX) 20 MG tablet Take 1 tablet (20 mg total) by mouth as needed for fluid or  edema. May take additional tablet ('20mg'$ ) as needed for weight gain of 2 pounds overnight or 5 pounds in 1 week. Patient not taking: Reported on 03/11/2022 01/09/22   Loel Dubonnet, NP    Inpatient Medications: Scheduled Meds:  apixaban  5 mg Oral BID   atorvastatin  80 mg Oral QPM   carvedilol  12.5 mg Oral BID   cycloSPORINE  1 drop Both Eyes BID   furosemide  40 mg Intravenous BID   guaiFENesin  600 mg Oral BID   insulin aspart  0-5 Units Subcutaneous QHS   insulin aspart  0-9 Units Subcutaneous TID WC   insulin glargine-yfgn  20 Units Subcutaneous Daily   levothyroxine  75 mcg Oral Daily   living well with diabetes book   Does not apply Once   loratadine  10 mg Oral Daily   losartan  25 mg Oral Daily   predniSONE  15 mg Oral Daily   QUEtiapine  25 mg Oral QHS   sodium chloride flush  3 mL Intravenous Q12H   Continuous Infusions:  azithromycin Stopped (03/12/22 1604)   cefTRIAXone (ROCEPHIN)  IV Stopped (03/12/22 1303)   PRN Meds: acetaminophen **OR** acetaminophen, albuterol, dextrose, morphine injection, ondansetron **OR** ondansetron (ZOFRAN) IV, pantoprazole, polyvinyl alcohol, traMADol, triamcinolone cream  Allergies:    Allergies  Allergen Reactions   Benazepril Cough   Gabapentin Swelling   Hydrocodone    Shellfish Allergy     Social History:   Social History   Socioeconomic History   Marital status: Married    Spouse name:  Not on file   Number of children: 4   Years of education: 16   Highest education level: Not on file  Occupational History   Occupation: Retired  Tobacco Use   Smoking status: Never   Smokeless tobacco: Never  Vaping Use   Vaping Use: Never used  Substance and Sexual Activity   Alcohol use: No   Drug use: No   Sexual activity: Not on file  Other Topics Concern   Not on file  Social History Narrative   Lives at home with his wife and son.   Right-handed.   2 cups caffeine per day.   Social Determinants of Health   Financial Resource Strain: Not on file  Food Insecurity: Not on file  Transportation Needs: Not on file  Physical Activity: Not on file  Stress: Not on file  Social Connections: Not on file  Intimate Partner Violence: Not on file    Family History:   Family History  Problem Relation Age of Onset   Hypertension Mother    Diabetes Mother    Arthritis Mother    Depression Mother    Heart attack Father    Hypertension Father      ROS:  Please see the history of present illness.  Review of Systems  Constitutional:  Positive for malaise/fatigue. Negative for fever.  HENT:  Positive for congestion.   Respiratory:  Positive for cough and sputum production. Negative for shortness of breath.   Cardiovascular:  Positive for chest pain and leg swelling. Negative for palpitations, orthopnea and PND.  Gastrointestinal:  Negative for blood in stool and melena.  Genitourinary:  Negative for hematuria.  Musculoskeletal:  Positive for back pain and falls.  Neurological:  Positive for dizziness and weakness. Negative for loss of consciousness.  Endo/Heme/Allergies:  Does not bruise/bleed easily.  Psychiatric/Behavioral:  Negative for substance abuse.      Physical Exam/Data:   Vitals:  03/12/22 1330 03/12/22 1430 03/12/22 1508 03/12/22 1700  BP: 105/66 104/64  108/78  Pulse: 80 84  85  Resp: '16 16  15  '$ Temp:   98.1 F (36.7 C) 98 F (36.7 C)  TempSrc:   Oral  Oral  SpO2: 95% 98%  99%  Weight:      Height:        Intake/Output Summary (Last 24 hours) at 03/12/2022 1731 Last data filed at 03/12/2022 1604 Gross per 24 hour  Intake 350 ml  Output --  Net 350 ml      03/11/2022   11:00 AM 02/20/2022    1:00 PM 01/30/2022    8:52 AM  Last 3 Weights  Weight (lbs) 230 lb 210 lb 185 lb  Weight (kg) 104.327 kg 95.255 kg 83.915 kg     Body mass index is 34.97 kg/m.  General: 71 y.o. African-American male resting comfortably in no acute distress. HEENT: Normocephalic and atraumatic. Sclera clear. Neck: Supple. No carotid bruits. No JVD. Heart: RRR. Distinct S1 and S2. No murmurs, gallops, or rubs. Radial and distal pedal pulses 2+ and equal bilaterally. Lungs: No increased work of breathing. Clear to ausculation bilaterally. No wheezes, rhonchi, or rales.  Abdomen: Soft, non-distended, and non-tender to palpation.  MSK: Normal strength and tone for age. Extremities: 2-3+ pitting edema of bilateral lower extremities.    Skin: Warm and dry. Neuro: Alert and oriented x3. No focal deficits. Psych: Normal affect. Responds appropriately.   EKG:  The EKG was personally reviewed and demonstrates:  Normal sinus rhythm, rate 87 bpm,  Telemetry:  Telemetry was personally reviewed and demonstrates:  Normal sinus rhythm with rates in the 80s to 120s.   Relevant CV Studies:  Echocardiogram 07/23/2021: Impressions:  1. Left ventricular ejection fraction, by estimation, is 35 to 40%. Left  ventricular ejection fraction by 2D MOD biplane is 35.4 %. The left  ventricle has moderately decreased function. The left ventricle  demonstrates regional wall motion abnormalities  (see scoring diagram/findings for description). Left ventricular diastolic  parameters are consistent with Grade I diastolic dysfunction (impaired  relaxation). There is severe hypokinesis of the left ventricular, entire  inferoseptal wall, inferior wall,  apical segment and  inferolateral wall.   2. Poorly visualized RV, however, based on TAPSE and RsVel, the right  ventricular systolic function is normal.   3. The mitral valve is abnormal. Trivial mitral valve regurgitation.   4. The aortic valve is tricuspid. Aortic valve regurgitation is mild.  Aortic valve sclerosis is present, with no evidence of aortic valve  stenosis.   5. Aortic dilatation noted. There is mild dilatation of the ascending  aorta, measuring 41 mm.   6. The inferior vena cava is normal in size with greater than 50%  respiratory variability, suggesting right atrial pressure of 3 mmHg.   Comparison(s): No prior Echocardiogram.   _______________   Coronary CTA 08/28/2021: Impressions: 1. Coronary artery calcium score 129 Agatston units. This places the patient in the 64th percentile for age and gender, suggesting intermediate risk for future cardiac events.   2. Suspect mild (25-49%) stenosis in the ostial/proximal LAD. FFR negative.  _______________  Echocardiogram 12/20/2021: Impressions:  1. Left ventricular ejection fraction, by estimation, is 55 to 60%. The  left ventricle has normal function. The left ventricle demonstrates  regional wall motion abnormalities (see scoring diagram/findings for  description). Left ventricular diastolic  parameters are consistent with Grade I diastolic dysfunction (impaired  relaxation). There is mild hypokinesis of  the left ventricular, apical  septal wall.   2. Right ventricular systolic function is normal. The right ventricular  size is normal.   3. The mitral valve is normal in structure. No evidence of mitral valve  regurgitation. No evidence of mitral stenosis.   4. The aortic valve is tricuspid. There is mild calcification of the  aortic valve. Aortic valve regurgitation is not visualized. Aortic valve  sclerosis/calcification is present, without any evidence of aortic  stenosis.   5. There is mild dilatation of the ascending aorta,  measuring 42 mm.   6. The inferior vena cava is normal in size with greater than 50%  respiratory variability, suggesting right atrial pressure of 3 mmHg.   Comparison(s): The left ventricular function has improved. The left  ventricular wall motion improved.    Laboratory Data:  High Sensitivity Troponin:   Recent Labs  Lab 03/11/22 1900  TROPONINIHS 35*     Chemistry Recent Labs  Lab 03/11/22 1048 03/12/22 0845  NA 142 141  K 4.5 4.7  CL 110 105  CO2 23 24  GLUCOSE 54* 246*  BUN 23 23  CREATININE 1.32* 1.28*  CALCIUM 9.5 9.2  GFRNONAA 57* 59*  ANIONGAP 9 12    Recent Labs  Lab 03/11/22 1048  PROT 6.1*  ALBUMIN 3.7  AST 27  ALT 27  ALKPHOS 44  BILITOT 1.1   Lipids No results for input(s): "CHOL", "TRIG", "HDL", "LABVLDL", "LDLCALC", "CHOLHDL" in the last 168 hours.  Hematology Recent Labs  Lab 03/11/22 1048 03/12/22 0845  WBC 5.2 4.4  RBC 3.05* 2.65*  HGB 9.7* 8.6*  HCT 30.6* 25.9*  MCV 100.3* 97.7  MCH 31.8 32.5  MCHC 31.7 33.2  RDW 14.6 14.5  PLT 168 146*   Thyroid  Recent Labs  Lab 03/11/22 1048  TSH 0.869  FREET4 0.83    BNP Recent Labs  Lab 03/11/22 1900  BNP 102.8*    DDimer No results for input(s): "DDIMER" in the last 168 hours.   Radiology/Studies:  CT HEAD WO CONTRAST  Result Date: 03/11/2022 CLINICAL DATA:  Altered mental status. EXAM: CT HEAD WITHOUT CONTRAST TECHNIQUE: Contiguous axial images were obtained from the base of the skull through the vertex without intravenous contrast. RADIATION DOSE REDUCTION: This exam was performed according to the departmental dose-optimization program which includes automated exposure control, adjustment of the mA and/or kV according to patient size and/or use of iterative reconstruction technique. COMPARISON:  07/25/2021 and MRI from 08/13/2021 FINDINGS: Brain: The brainstem, cerebellum, cerebral peduncles, thalami, basal ganglia, basilar cisterns, and ventricular system appear within normal  limits. No intracranial hemorrhage, mass lesion, or acute CVA. Periventricular white matter and corona radiata hypodensities favor chronic ischemic microvascular white matter disease. Vascular: There is atherosclerotic calcification of the cavernous carotid arteries bilaterally. Skull: Unremarkable Sinuses/Orbits: Chronic bilateral maxillary, ethmoid, and frontal sinusitis. There is potentially a small air-fluid level in the right frontal sinus such that superimposed mild acute right frontal sinusitis is not excluded. Other: No supplemental non-categorized findings. IMPRESSION: 1. No acute intracranial findings. 2. Periventricular white matter and corona radiata hypodensities favor chronic ischemic microvascular white matter disease. 3. Chronic bilateral maxillary, ethmoid, and frontal sinusitis. There is potentially a small air-fluid level in the right frontal sinus such that superimposed mild acute right frontal sinusitis is not excluded. 4. Atherosclerosis. Electronically Signed   By: Van Clines M.D.   On: 03/11/2022 12:14   DG Chest Port 1 View  Result Date: 03/11/2022 CLINICAL DATA:  71 year old  male with history of altered mental status. EXAM: PORTABLE CHEST 1 VIEW COMPARISON:  Chest x-ray 07/24/2021. FINDINGS: Lung volumes are low. Diffuse interstitial prominence and peribronchial cuffing, most evident throughout the left lung, particularly in the left lung base. Right lung appears relatively clear. No pleural effusions. No pneumothorax. No evidence of pulmonary edema. Heart size is borderline enlarged. Upper mediastinal contours are within normal limits. IMPRESSION: 1. Mild diffuse interstitial prominence and peribronchial cuffing, most severe in the left lung, particularly at the left lung base. Findings are concerning for bronchitis and probable developing left basilar bronchopneumonia. Electronically Signed   By: [name] M.D.   On: 03/11/2022 11:26     Assessment and Plan:    Chronic Lower Extremity Edema Possible Acute on Chronic Diastolic CHF LVEF as low as 35-40% in 06/2021 in setting of acute saddle PE but EF has since normalized. Echo in 11/2021 showed LVEF of 55-60% with mild hypokinesis of apical septal wall. Patient presented with altered mental status and found to have RSV pneumonia. He was noted to have significant lower extremity edema which is a chronic issue. He also reports lip swelling and was concerned this may be due to one of his cardiac medications (presumably Entresto). BNP minimally elevated at 102.8. Chest x-ray showed mild diffuse interstitial prominence and peribronchial cuffing (most severe in the left lung) particularly at the left lung base. Findings concerning for bronchitis and probable developing left basilar bronchopneumonia.  - He does have significant lower extremity edema on exam but otherwise appears euvolemic. It does not look like he has any significant swelling of his lips or tongues - I don't think this is angioedema from [facility]. - Continue IV Lasix '40mg'$  twice daily. - Will stop Entresto and start Losartan '25mg'$  daily given EF has normalized and he has soft BP with reports of intermittent dizziness.  - Continue Coreg 12.'5mg'$  twice daily for now. However, may need to stop this and switch to Toprol-XL given soft BP. - Continue to hold home Spironolactone for now given soft BP. - Continue to monitor daily weights, strict I/O's, and renal function.  Atypical Chest Pain CAD Coronary CTA in 08/2021 showed mild non obstructive CAD. He reports very atypical chest pain since he was diagnosed with PE in 06/2021. EKG shows no acute ischemic changes. High-sensitivity troponin negative. - He has very atypical chest pain that is reproducible with palpation of chest wall consistent with musculoskeletal pain. - No aspirin due to need for DOAC. - Continue high-intensity statin.  Hypertension History of hypertension but BP has been soft lately. -  Will stop Entresto and switch to Losartan '25mg'$  daily as above. - Continue Coreg 12.'5mg'$  twice daily. However, may need to stop this and switch to Toprol-XL given soft BP.  Hyperlipidemia - Continue Lipitor '80mg'$  daily.  CKD Stage III Creatinine 1.32 on admission and 1.28 today. Baseline creatinine around 1.1 to 1.3. - Continue to monitor closely with diuresis.  History of PE/DVT Diagnosed with acute saddle PE and right lower extremity DVT in 06/2021. - Maintained on Eliquis '5mg'$  twice daily.  Otherwise, per primary team: - Acute metabolic encephalopathy - RSV pneumonia - Recent sinusitis  - Autoimmune myopathy - Rheumatoid arthritis  - Type 2 diabetes mellitus with hypoglycemia - Elevated ammonia level - Macrocytic anemia  Risk Assessment/Risk Scores:    New York Heart Association (NYHA) Functional Class NYHA Class II   For questions or updates, please contact [place] Please consult [email or web address] for contact info under  Signed, Darreld Mclean, PA-C  03/12/2022 5:31 PM As above, patient seen and examined.  Briefly he is a 71 year old male with past medical history of diabetes mellitus, hypertension, chronic diastolic congestive heart failure, autoimmune myopathy/myositis, hypothyroidism, prior PE/DVT, chronic stage III kidney disease admitted with altered mental status/hypoglycemia who I am asked to evaluate for possible congestive heart failure.  Patient has had a recent bout of bronchitis.  He recovered and 1 day later developed productive cough.  He also has chest pain but only when he coughs.  He has been admitted and diagnosed with pneumonia/RSV.  He has also been noted to have pedal edema.  Cardiology now asked to evaluate.  He denies orthopnea or PND. Chest x-ray with possible bilateral basilar bronchopneumonia.  Creatinine 1.28.  BNP 102.8, CK15 104, troponin 35, hemoglobin 8.6, platelet count 146, hemoglobin A1c 9.  Respiratory panel positive for RSV.   Electrocardiogram shows normal sinus rhythm with nonspecific ST changes.  1 question acute on chronic diastolic congestive heart failure-BNP minimally elevated.  He does have 2+ edema in his ankles but his wife states that has been chronic.  We will treat with IV Lasix and follow renal function.  Repeat echocardiogram.  Most recent study showed normalization of LV function.  2 history of cardiomyopathy-LV function has improved on most recent echocardiogram.  Patient's blood pressure has been low at home.  We will discontinue Entresto and treat with losartan 25 mg daily.  Continue carvedilol.  Repeat echocardiogram.  3 hypertension-as outlined above blood pressure borderline.  Discontinue Entresto and treat with losartan.  Follow blood pressure and adjust as needed.  4 history of DVT-he is on Eliquis.  5 hyperlipidemia-continue statin.  6 upper respiratory infection-antibiotics per primary service.  7 acute metabolic encephalopathy-felt likely secondary to hypoglycemia.  Management per primary service.  Kirk Ruths, MD

## 2022-03-12 NOTE — ED Notes (Signed)
Ambulated using a walker with the assistance of this RN. Pt was able to take 3-5 steps from the bed but pt was observed to be very unsteady this time. Reports, "I feel very weak." O2 level maintained at 94-95% on RA while ambulating. Pt was assisted back to bed. Pt needed assistance lifting his legs back to bed as pt was unable to do so. Will continue to monitor.

## 2022-03-13 ENCOUNTER — Other Ambulatory Visit (HOSPITAL_COMMUNITY): Payer: Self-pay

## 2022-03-13 DIAGNOSIS — G9341 Metabolic encephalopathy: Secondary | ICD-10-CM | POA: Diagnosis not present

## 2022-03-13 DIAGNOSIS — I5032 Chronic diastolic (congestive) heart failure: Secondary | ICD-10-CM

## 2022-03-13 LAB — BASIC METABOLIC PANEL
Anion gap: 10 (ref 5–15)
BUN: 21 mg/dL (ref 8–23)
CO2: 27 mmol/L (ref 22–32)
Calcium: 9.2 mg/dL (ref 8.9–10.3)
Chloride: 102 mmol/L (ref 98–111)
Creatinine, Ser: 1.26 mg/dL — ABNORMAL HIGH (ref 0.61–1.24)
GFR, Estimated: >60 mL/min (ref >60)
Glucose, Bld: 227 mg/dL — ABNORMAL HIGH (ref 70–99)
Potassium: 4.2 mmol/L (ref 3.5–5.1)
Sodium: 139 mmol/L (ref 135–145)

## 2022-03-13 LAB — GLUCOSE, CAPILLARY
Glucose-Capillary: 188 mg/dL — ABNORMAL HIGH (ref 70–99)
Glucose-Capillary: 285 mg/dL — ABNORMAL HIGH (ref 70–99)
Glucose-Capillary: 417 mg/dL — ABNORMAL HIGH (ref 70–99)
Glucose-Capillary: 283 mg/dL — ABNORMAL HIGH (ref 70–99)

## 2022-03-13 LAB — CBC WITH DIFFERENTIAL/PLATELET
Abs Immature Granulocytes: 0.02 10*3/uL (ref 0.00–0.07)
Basophils Absolute: 0 10*3/uL (ref 0.0–0.1)
Basophils Relative: 0 %
Eosinophils Absolute: 0 10*3/uL (ref 0.0–0.5)
Eosinophils Relative: 1 %
HCT: 25.8 % — ABNORMAL LOW (ref 39.0–52.0)
Hemoglobin: 8.5 g/dL — ABNORMAL LOW (ref 13.0–17.0)
Immature Granulocytes: 1 %
Lymphocytes Relative: 17 %
Lymphs Abs: 0.7 10*3/uL (ref 0.7–4.0)
MCH: 31.7 pg (ref 26.0–34.0)
MCHC: 32.9 g/dL (ref 30.0–36.0)
MCV: 96.3 fL (ref 80.0–100.0)
Monocytes Absolute: 0.2 10*3/uL (ref 0.1–1.0)
Monocytes Relative: 3 %
Neutro Abs: 3.5 10*3/uL (ref 1.7–7.7)
Neutrophils Relative %: 78 %
Platelets: 153 10*3/uL (ref 150–400)
RBC: 2.68 MIL/uL — ABNORMAL LOW (ref 4.22–5.81)
RDW: 14.3 % (ref 11.5–15.5)
WBC: 4.4 10*3/uL (ref 4.0–10.5)
nRBC: 0 % (ref 0.0–0.2)

## 2022-03-13 LAB — CK: Total CK: 606 U/L — ABNORMAL HIGH (ref 49–397)

## 2022-03-13 MED ORDER — INSULIN GLARGINE-YFGN 100 UNIT/ML ~~LOC~~ SOLN
25.0000 [IU] | Freq: Every day | SUBCUTANEOUS | Status: DC
  Administered 2022-03-13: 25 [IU] via SUBCUTANEOUS
  Filled 2022-03-13 (×2): qty 0.25

## 2022-03-13 MED ORDER — BENZONATATE 100 MG PO CAPS: 100.0000 mg | ORAL_CAPSULE | Freq: Three times a day (TID) | ORAL | Status: DC

## 2022-03-13 MED ORDER — BENZONATATE 100 MG PO CAPS
100.0000 mg | ORAL_CAPSULE | Freq: Three times a day (TID) | ORAL | Status: DC
  Administered 2022-03-13 – 2022-03-15 (×7): 100 mg via ORAL
  Filled 2022-03-13 (×7): qty 1

## 2022-03-13 MED ORDER — INSULIN GLARGINE-YFGN 100 UNIT/ML ~~LOC~~ SOLN
15.0000 [IU] | Freq: Every day | SUBCUTANEOUS | Status: DC
  Filled 2022-03-13: qty 0.15

## 2022-03-13 MED ORDER — INSULIN ASPART 100 UNIT/ML IJ SOLN
0.0000 [IU] | Freq: Every day | INTRAMUSCULAR | Status: DC
  Administered 2022-03-13: 3 [IU] via SUBCUTANEOUS
  Administered 2022-03-14: 5 [IU] via SUBCUTANEOUS

## 2022-03-13 MED ORDER — INSULIN ASPART 100 UNIT/ML IJ SOLN
0.0000 [IU] | Freq: Three times a day (TID) | INTRAMUSCULAR | Status: DC
  Administered 2022-03-14: 11 [IU] via SUBCUTANEOUS
  Administered 2022-03-14 – 2022-03-15 (×3): 8 [IU] via SUBCUTANEOUS

## 2022-03-13 MED ORDER — INSULIN GLARGINE-YFGN 100 UNIT/ML ~~LOC~~ SOLN: 30.0000 [IU] | Freq: Two times a day (BID) | SUBCUTANEOUS | Status: DC

## 2022-03-13 MED ORDER — INSULIN GLARGINE-YFGN 100 UNIT/ML ~~LOC~~ SOLN
30.0000 [IU] | Freq: Every day | SUBCUTANEOUS | Status: DC
  Administered 2022-03-13 – 2022-03-14 (×2): 30 [IU] via SUBCUTANEOUS
  Filled 2022-03-13 (×2): qty 0.3

## 2022-03-13 MED ORDER — POLYETHYLENE GLYCOL 3350 17 G PO PACK
17.0000 g | PACK | Freq: Every day | ORAL | Status: DC
  Administered 2022-03-13 – 2022-03-15 (×3): 17 g via ORAL
  Filled 2022-03-13 (×3): qty 1

## 2022-03-13 NOTE — Inpatient Diabetes Management (Addendum)
Inpatient Diabetes Program Recommendations  AACE/ADA: New Consensus Statement on Inpatient Glycemic Control  Target Ranges:  Prepandial:   less than 140 mg/dL      Peak postprandial:   less than 180 mg/dL (1-2 hours)      Critically ill patients:  140 - 180 mg/dL    Latest Reference Range & Units 03/12/22 08:50 03/12/22 12:17 03/12/22 18:01 03/12/22 22:26 03/13/22 08:22  Glucose-Capillary 70 - 99 mg/dL 233 (H) 294 (H) 304 (H) 303 (H) 188 (H)   Review of Glycemic Control  Diabetes history: DM2 Outpatient Diabetes medications: Lantus 44 units QAM, Lantus 42 units QPM, Glucotrol 20 mg daily, Metformin 1000 mg QAM, Metformin 500 mg QPM Current orders for Inpatient glycemic control: Semglee 30 units QAM, Semglee 15 units QHS, Novolog 0-9 units TID with meals, Novolog 0-5 units QHS; Prednisone 15 mg daily  Inpatient Diabetes Program Recommendations:    Insulin: Noted insulin changes made today.   Outpatient DM: Patient will need Rx for Dexcom from Endocrinology Peri Jefferson, PA-C) as he will need to be educated on how to apply and use the Dexcom CGM.  NOTE: Patient sees [facility] Endocrinology for DM management and last seen Peri Jefferson, PA-C on 02/12/22. Per note by Lyndel Safe, CPhT on 03/13/22, Prior Authorization for [employer], Clinical biochemist) has been approved.    PA# [id number] Effective dates: 03/13/2022 through 03/14/2023.   Addendum 03/13/22'@13'$ :50-Talked with patient at bedside regarding CGM. Informed patient that per a benefit check, the Dexcom CGM is preferred with insurance and pharmacy has submitted prior authorization and Dexcom has been approved through insurance. Patient confirms that he sees Peri Jefferson, PA-C (Endocrinology). Informed patient that he would need to get educated on the [facility] by Endocrinology office educators; asked that he call them to see if Endocrinology will provide Rx for Dexcom and set up an appointment for education. Explained that our  team will not provide him with the Elenor Legato since the Dexcom is preferred with insurance. Patient states that he will let his wife know and that I could call his wife tomorrow about it just to make sure she is aware. Informed Debbie, RN about conversation with patient and request to call his wife tomorrow.  Thanks, Barnie Alderman, RN, MSN, [place] Diabetes Coordinator Inpatient Diabetes Program [phone] (Team Pager from 8am to [place])

## 2022-03-13 NOTE — Progress Notes (Signed)
PROGRESS NOTE  [name]  DOB: 1951-04-23  PCP: [name], DO NTZ:[id number]  DOA: 03/11/2022  LOS: 2 days  Hospital Day: 3  Brief narrative: [name] is a 71 y.o. male with PMH significant for DM2, HTN, diastolic CHF, autoimmune myopathy/myositis, hypothyroidism, PE/DVT, and CKD stage III . 11/12, patient was brought to the ED from home for altered mental status.  The day prior, they had driven their son back to [employer] and on the way back she noticed that he started speaking in third person. Prior to their son getting home patient had had a sinus infection which was treated with antibiotics and had resolved.  His wife thinks that patient's son may have brought some back from school as he is started coughing since he had been home.  Patient had been complaining of a headache and congestion.  In the ED, patient was afebrile, heart rate 90s, blood pressure 100s Labs with WC count normal, hemoglobin low at 9.7, creatinine elevated to 1.32, ammonia slightly elevated to 48, CK total elevated 1500, troponin elevated 35. CT head did not show any acute intracranial findings.  Showed periventricular white matter coronary due to hypodensities Favor chronic ischemic microvascular white matter disease.  It also showed chronic bilateral maxillary ethmoid and frontal sinusitis, potentially a small air-fluid level of the right frontal sinus suggestive of mild acute right frontal sinusitis. Chest x-ray showed mild diffuse interstitial prominence and peribronchial cuffing, most severe at the left lung base.  Findings concerning for bronchitis and probable developing left basilar bronchopneumonia. RVP positive for RSV. Patient was started on IV Rocephin, azithromycin Admitted to hospitalist service  Subjective: Patient was seen and examined this morning. Pleasant elderly African-American male.  Propped up in bed.  Not in distress.  Wife at bedside helping with breakfast. Not on supplemental  exam. Bilateral pedal edema improving. Patient has intermittent instances of cough due to postnasal drip  Assessment and plan: RSV pneumonia Left basilar bronchopneumonia After his son returned home from school, patient started to have cough, headache and congestion. RVP positive for RSV Chest x-ray as above suggestive of left basilar bronchopneumonia. WBC count, lactic acid normal.  Procalcitonin slightly elevated. Currently on IV Rocephin, IV azithromycin.  Continue the same. Continue Mucinex, bronchodilators Recent Labs  Lab 03/11/22 1048 03/11/22 1900 03/12/22 0845 03/13/22 0308  WBC 5.2  --  4.4 4.4  LATICACIDVEN 1.7  --   --   --   PROCALCITON  --  0.29  --   --     Recent sinusitis Per history, few days prior patient had had a sinus infection which was treated with antibiotics and had resolved.   On exam, patient has sinus tenderness over frontal area. CT scan of the brain noted concern for acute sinusitis.  Already on antibiotics Add Tessalon Perles   Acute exacerbation of diastolic CHF Essential hypertension Wife reported grossly worsening lip swelling and 8 to 10 pound weight gain recently.   Last EF noted to be 55-60% with grade 1 diastolic dysfunction by echocardiogram 11/2021. PTA on Coreg 12.5 mg twice daily, Entresto 24-26 mg half tablet twice daily, spironolactone 12.5 mg daily, and furosemide 20 mg daily as needed for fluid retention.   Currently on Lasix 40 mg IV twice daily.  Cardiology following.  CHF meds per cardiology. Net IO Since Admission: 803.77 mL [03/13/22 1130] Continue to monitor for daily intake output, weight, blood pressure, BNP, renal function and electrolytes. Recent Labs  Lab 03/11/22 1048 03/11/22 1900 03/12/22 0845  03/13/22 0308  BNP  --  102.8*  --   --   BUN 23  --  23 21  CREATININE 1.32*  --  1.28* 1.26*  K 4.5  --  4.7 4.2   Type 2 diabetes mellitus with hypoglycemia A1c 9 on 03/11/2022 On admission, glucose level was  noted to be low at 49.  Blood glucose level fluctuating as high as 256 in the last 24 hours. PTA on Lantus 44 units a.m./44 units p.m., glipizide 20 mg daily, metformin 1000 mg a.m/500 mg p.m., acarbose 100 mg 3 times daily. Currently on Lantus 20 units daily only. Blood sugar trend noted.  Increase a.m. Lantus to 30 units and is started on Lantus 15 units at bedtime.  Oral meds on hold. Continue sliding scale insulin with Accu-Cheks Recent Labs  Lab 03/12/22 0850 03/12/22 1217 03/12/22 1801 03/12/22 2226 03/13/22 0822  GLUCAP 233* 294* 304* 303* 188*   Chronic kidney disease stage IIIa Creatinine 1.32 which appears around patient's baseline. Continue to monitor kidney function with diuresis Recent Labs    07/26/21 0344 07/27/21 0310 08/09/21 0000 09/05/21 1037 10/09/21 1123 01/09/22 1304 01/30/22 0000 03/11/22 1048 03/12/22 0845 03/13/22 0308  BUN '17 14 24 24 20 26 13 23 23 21  '$ CREATININE 1.46* 1.39* 1.18 1.14 1.11 1.35* 1.20 1.32* 1.28* 1.79*   Acute metabolic encephalopathy Presented with altered mental status  CT head did not show any acute intracranial abnormality.  Altered mentation likely due to pneumonia.  See management pneumonia below.  Mental status gradually improving.  CAD H/o NSTEMI HLD Coronary CT in May noted no signs of significant stenosis in the proximal/proximal LAD. Continue Eliquis and statin.   Autoimmune myopathy  rheumatoid arthritis Patient is on methotrexate and chronic prednisone.  He is followed by rheumatology in the outpatient setting. CK level was initially elevated to 1500.  Down to 606 today. Continue prednisone and resume methotrexate in outpatient setting Continue tramadol as needed for pain Recent Labs  Lab 03/11/22 1900 03/13/22 0308  CKTOTAL 1,504* 606*   Elevated ammonia level Unclear etiology.  No finding of liver cirrhosis and the most recent imaging from March 2023 Not on Depakote.  Repeat ammonia level showed an  improvement to 29. Recent Labs  Lab 03/11/22 1048 03/12/22 0845  AMMONIA 48* 29    History of DVT/PE on chronic anticoagulation In March 2023, patient had saddle pulm embolism, right heart strain and right lower EXTR DVT for which she underwent thrombectomy by IR.  VTE was thought possibly provoked due to patient being on testosterone injections and impaired mobility due to myositis. Chronically anticoagulated with Eliquis.  Continue the same.  Macrocytic anemia Hemoglobin at baseline close to 9.  No active bleeding. Recent Labs    01/09/22 1304 01/30/22 0000 03/11/22 1048 03/12/22 0845 03/13/22 0308  HGB 10.6* 10.0* 9.7* 8.6* 8.5*  MCV 97 97.1 100.3* 97.7 96.3  VITAMINB12  --  207  --   --   --   FOLATE  --  >24.0  --   --   --   FERRITIN  --  316  --   --   --   TIBC  --  230*  --   --   --   IRON  --  108  --   --   --     Hypothyroidism TSH 0.869 Continue levothyroxine   Morbid obesity  -Body mass index is 34.97 kg/m. Patient has been advised to make an attempt  to improve diet and exercise patterns to aid in weight loss.  Impaired mobility Limited mobility because of bilateral lower extremity edema as well as myositis. Continue as needed tramadol.  PT eval ordered.  Goals of care   Code Status: Full Code   Skin assessment:     Nutritional status:  Body mass index is 34.97 kg/m.          Diet:  Diet Order             Diet heart healthy/carb modified Room service appropriate? Yes; Fluid consistency: Thin  Diet effective now                   DVT prophylaxis:   apixaban (ELIQUIS) tablet 5 mg   Antimicrobials: Rocephin, azithromycin Fluid: None Consultants: Cardiology  Family Communication: Wife at bedside  Status is: Inpatient  Continue in-hospital care because: On IV antibiotics, IV diuresis, pending PT eval Level of care: Telemetry Medical   Dispo: The patient is from: Home              Anticipated d/c is to: Pending clinical  course              Patient currently is not medically stable to d/c.   Difficult to place patient No     Infusions:   azithromycin Stopped (03/12/22 1604)   cefTRIAXone (ROCEPHIN)  IV Stopped (03/12/22 1303)    Scheduled Meds:  apixaban  5 mg Oral BID   atorvastatin  80 mg Oral QPM   benzonatate  100 mg Oral TID   carvedilol  12.5 mg Oral BID   cycloSPORINE  1 drop Both Eyes BID   furosemide  40 mg Intravenous BID   guaiFENesin  600 mg Oral BID   insulin aspart  0-5 Units Subcutaneous QHS   insulin aspart  0-9 Units Subcutaneous TID WC   insulin glargine-yfgn  15 Units Subcutaneous QHS   insulin glargine-yfgn  30 Units Subcutaneous Daily   levothyroxine  75 mcg Oral Daily   loratadine  10 mg Oral Daily   losartan  25 mg Oral Daily   predniSONE  15 mg Oral Daily   QUEtiapine  25 mg Oral QHS   sodium chloride flush  3 mL Intravenous Q12H    PRN meds: acetaminophen **OR** acetaminophen, albuterol, dextrose, guaiFENesin-dextromethorphan, morphine injection, ondansetron **OR** ondansetron (ZOFRAN) IV, pantoprazole, polyvinyl alcohol, traMADol, triamcinolone cream   Antimicrobials: Anti-infectives (From admission, onward)    Start     Dose/Rate Route Frequency Ordered Stop   03/12/22 1300  cefTRIAXone (ROCEPHIN) 2 g in sodium chloride 0.9 % 100 mL IVPB        2 g 200 mL/hr over 30 Minutes Intravenous Every 24 hours 03/11/22 1501 03/16/22 1259   03/12/22 1300  azithromycin (ZITHROMAX) 500 mg in sodium chloride 0.9 % 250 mL IVPB        500 mg 250 mL/hr over 60 Minutes Intravenous Every 24 hours 03/11/22 1501     03/11/22 1300  cefTRIAXone (ROCEPHIN) 1 g in sodium chloride 0.9 % 100 mL IVPB  Status:  Discontinued        1 g 200 mL/hr over 30 Minutes Intravenous  Once 03/11/22 1257 03/11/22 1257   03/11/22 1300  azithromycin (ZITHROMAX) 500 mg in sodium chloride 0.9 % 250 mL IVPB        500 mg 250 mL/hr over 60 Minutes Intravenous  Once 03/11/22 1257 03/11/22 1426   03/11/22  1300  cefTRIAXone (ROCEPHIN)  2 g in sodium chloride 0.9 % 100 mL IVPB        2 g 200 mL/hr over 30 Minutes Intravenous  Once 03/11/22 1257 03/11/22 1341       Objective: Vitals:   03/13/22 0830 03/13/22 0926  BP: (!) 115/59 (!) 113/56  Pulse: 86 82  Resp: 20   Temp: 98.8 F (37.1 C)   SpO2: 95%     Intake/Output Summary (Last 24 hours) at 03/13/2022 1130 Last data filed at 03/13/2022 0939 Gross per 24 hour  Intake 353 ml  Output 900 ml  Net -547 ml   Filed Weights   03/11/22 1100  Weight: 104.3 kg   Weight change:  Body mass index is 34.97 kg/m.   Physical Exam: General exam: Pleasant, elderly African-American male.  Not in pain Skin: No rashes, lesions or ulcers. HEENT: Atraumatic, normocephalic, no obvious bleeding Lungs: Clear to auscultation bilaterally CVS: Regular rate and rhythm, no murmur GI/Abd soft, nontender, nondistended, bowel sound present CNS: Alert, awake, slow to but able to answer orientation questions.  Not yet back to baseline Psychiatry: Mood appropriate Extremities: Improving bilateral pedal edema.  Tenderness all over because of myositis  Data Review: I have personally reviewed the laboratory data and studies available.  F/u labs ordered Unresulted Labs (From admission, onward)     Start     Ordered   03/13/22 2549  Basic metabolic panel  Daily at 5am,   R      03/12/22 1254   03/13/22 0500  CBC with Differential/Platelet  Daily at 5am,   R      03/12/22 1254            Signed, Terrilee Croak, MD [facility] 03/13/2022

## 2022-03-13 NOTE — Evaluation (Signed)
Physical Therapy Evaluation Patient Details Name: [name] MRN: [id number] DOB: [birth year] Today's Date: 03/13/2022  History of Present Illness  Pt is 71 year old [facility]  [facility]/22 with AMS and cough. Pt found to have RSV PNA and acute on chronic CHF.  PMH: CHF, DVT, PE, diabetes, hypertension, anemia, hypothyroidism, BPH, GERD, PTSD, autoimmune myositis, ckd  Clinical Impression  Pt admitted with above diagnosis and presents to PT with functional limitations due to deficits listed below (See PT problem list). Pt needs skilled PT to maximize independence and safety to allow discharge to home with wife's assist. Pt's mobility has been limited for some time and pt wife feel he can return home and declined HHPT. Will continue to follow acutely.        Recommendations for follow up therapy are one component of a multi-disciplinary discharge planning process, led by the attending physician.  Recommendations may be updated based on patient status, additional functional criteria and insurance authorization.  Follow Up Recommendations No PT follow up (Pt/wife feel they can manage at home)      Assistance Recommended at Discharge Frequent or constant Supervision/Assistance  Patient can return home with the following  A lot of help with walking and/or transfers;Help with stairs or ramp for entrance;Assist for transportation;A lot of help with bathing/dressing/bathroom    Equipment Recommendations None recommended by PT  Recommendations for Other Services       Functional Status Assessment Patient has had a recent decline in their functional status and demonstrates the ability to make significant improvements in function in a reasonable and predictable amount of time.     Precautions / Restrictions Precautions Precautions: Fall Restrictions Weight Bearing Restrictions: No      Mobility  Bed Mobility Overal bed mobility: Needs Assistance Bed Mobility: Supine to Sit      Supine to sit: Mod assist     General bed mobility comments: Assist to bring legs off of bed, elevate trunk into sitting and bring hips to EOB    Transfers Overall transfer level: Needs assistance Equipment used: Rollator (4 wheels) Transfers: Sit to/from Stand Sit to Stand: Mod assist           General transfer comment: Assist to bring hips up and verbal cues for hand placement    Ambulation/Gait Ambulation/Gait assistance: Min assist Gait Distance (Feet): 75 Feet Assistive device: Rollator (4 wheels) Gait Pattern/deviations: Decreased step length - right, Decreased step length - left, Step-through pattern, Shuffle, Knees buckling Gait velocity: decr Gait velocity interpretation: <1.8 ft/sec, indicate of risk for recurrent falls   General Gait Details: Assist for balance and support with left knee beginning to buckle as pt fatigued. Stopped and sat on rollator for return to room. Verbal/tactile cues to stay closer to the rollator.  Stairs            Wheelchair Mobility    Modified Rankin (Stroke Patients Only)       Balance Overall balance assessment: Needs assistance Sitting-balance support: No upper extremity supported, Feet supported Sitting balance-Leahy Scale: Good     Standing balance support: Bilateral upper extremity supported, During functional activity, Reliant on assistive device for balance Standing balance-Leahy Scale: Poor Standing balance comment: rollator and min assist for static standing                             Pertinent Vitals/Pain Pain Assessment Pain Assessment: Faces Faces Pain Scale: Hurts little more  Pain Location: rt lower back Pain Descriptors / Indicators: Grimacing, Guarding Pain Intervention(s): Limited activity within patient's tolerance    Home Living Family/patient expects to be discharged to:: Private residence Living Arrangements: Spouse/significant other Available Help at Discharge: Family;Available  24 hours/day Type of Home: House Home Access: Stairs to enter Entrance Stairs-Rails: None Entrance Stairs-Number of Steps: 3 Alternate Level Stairs-Number of Steps: 18 Home Layout: Two level;Bed/bath upstairs Home Equipment: Rollator (4 wheels);Cane - single point;Transport chair;Hand held shower head      Prior Function Prior Level of Function : Needs assist       Physical Assist : Mobility (physical);ADLs (physical) Mobility (physical): Bed mobility;Transfers;Gait;Stairs ADLs (physical): Bathing;Dressing;Toileting Mobility Comments: Pt uses rollator for mobility. If LE's are very swollen then pt with very limited household mobility. When legs less swollen mobilizes more. Wife assist PRN. ADLs Comments: Wife can assist PRN.     Hand Dominance   Dominant Hand: Right    Extremity/Trunk Assessment   Upper Extremity Assessment Upper Extremity Assessment: Generalized weakness    Lower Extremity Assessment Lower Extremity Assessment: Generalized weakness;RLE deficits/detail;LLE deficits/detail RLE Deficits / Details: hips <3/5 LLE Deficits / Details: hips <3/5       Communication   Communication: No difficulties  Cognition Arousal/Alertness: Awake/alert Behavior During Therapy: WFL for tasks assessed/performed Overall Cognitive Status: Within Functional Limits for tasks assessed                                          General Comments General comments (skin integrity, edema, etc.): VSS on RA    Exercises     Assessment/Plan    PT Assessment Patient needs continued PT services  PT Problem List Decreased strength;Decreased activity tolerance;Decreased balance;Decreased mobility       PT Treatment Interventions DME instruction;Gait training;Functional mobility training;Therapeutic activities;Therapeutic exercise;Balance training;Patient/family education;Stair training    PT Goals (Current goals can be found in the Care Plan section)  Acute Rehab  PT Goals Patient Stated Goal: return home PT Goal Formulation: With patient/family Time For Goal Achievement: 03/27/22 Potential to Achieve Goals: Good    Frequency Min 3X/week     Co-evaluation               AM-PAC PT "6 Clicks" Mobility  Outcome Measure Help needed turning from your back to your side while in a flat bed without using bedrails?: A Lot Help needed moving from lying on your back to sitting on the side of a flat bed without using bedrails?: A Lot Help needed moving to and from a bed to a chair (including a wheelchair)?: A Lot Help needed standing up from a chair using your arms (e.g., wheelchair or bedside chair)?: A Lot Help needed to walk in hospital room?: A Little Help needed climbing 3-5 steps with a railing? : Total 6 Click Score: 12    End of Session Equipment Utilized During Treatment: Gait belt Activity Tolerance: Patient tolerated treatment well Patient left: in chair;with call bell/phone within reach;with chair alarm set;with family/visitor present Nurse Communication: Mobility status PT Visit Diagnosis: Unsteadiness on feet (R26.81);Other abnormalities of gait and mobility (R26.89);History of falling (Z91.81);Muscle weakness (generalized) (M62.81)    Time: 1022-1050 PT Time Calculation (min) (ACUTE ONLY): 28 min   Charges:   PT Evaluation $PT Eval Moderate Complexity: 1 Mod PT Treatments $Gait Training: 8-22 mins        [facility] PT  Acute Rehabilitation Services Office [place] 03/13/2022, 1:59 PM

## 2022-03-13 NOTE — Telephone Encounter (Signed)
Patient Advocate Encounter  Prior Authorization for [employer], Clinical biochemist) has been approved.    PA# [id number] Effective dates: 03/13/2022 through 03/14/2023      Lyndel Safe, [place] Patient Advocate Specialist [place] Patient Advocate Team Direct Number: [phone]  Fax: [phone]

## 2022-03-13 NOTE — Progress Notes (Signed)
Rounding Note    Patient Name: [name] Date of Encounter: 03/13/2022  [place] Cardiologist: Pixie Casino, MD   Subjective   No CP or dyspnea; continues with cough  Inpatient Medications    Scheduled Meds:  apixaban  5 mg Oral BID   atorvastatin  80 mg Oral QPM   carvedilol  12.5 mg Oral BID   cycloSPORINE  1 drop Both Eyes BID   furosemide  40 mg Intravenous BID   guaiFENesin  600 mg Oral BID   insulin aspart  0-5 Units Subcutaneous QHS   insulin aspart  0-9 Units Subcutaneous TID WC   insulin glargine-yfgn  15 Units Subcutaneous QHS   insulin glargine-yfgn  30 Units Subcutaneous Daily   levothyroxine  75 mcg Oral Daily   loratadine  10 mg Oral Daily   losartan  25 mg Oral Daily   predniSONE  15 mg Oral Daily   QUEtiapine  25 mg Oral QHS   sodium chloride flush  3 mL Intravenous Q12H   Continuous Infusions:  azithromycin Stopped (03/12/22 1604)   cefTRIAXone (ROCEPHIN)  IV Stopped (03/12/22 1303)   PRN Meds: acetaminophen **OR** acetaminophen, albuterol, dextrose, guaiFENesin-dextromethorphan, morphine injection, ondansetron **OR** ondansetron (ZOFRAN) IV, pantoprazole, polyvinyl alcohol, traMADol, triamcinolone cream   Vital Signs    Vitals:   03/13/22 0400 03/13/22 0500 03/13/22 0830 03/13/22 0926  BP: 104/65  (!) 115/59 (!) 113/56  Pulse: 83 78 86 82  Resp: '19 16 20   '$ Temp: 98.4 F (36.9 C)  98.8 F (37.1 C)   TempSrc: Oral  Oral   SpO2: 94% 94% 95%   Weight:      Height:        Intake/Output Summary (Last 24 hours) at 03/13/2022 1043 Last data filed at 03/13/2022 0939 Gross per 24 hour  Intake 353 ml  Output 900 ml  Net -547 ml      03/11/2022   11:00 AM 02/20/2022    1:00 PM 01/30/2022    8:52 AM  Last 3 Weights  Weight (lbs) 230 lb 210 lb 185 lb  Weight (kg) 104.327 kg 95.255 kg 83.915 kg      Telemetry    Sinus - Personally Reviewed   Physical Exam   GEN: No acute distress.   Neck: No JVD Cardiac: RRR, no  murmurs, rubs, or gallops.  Respiratory: Clear to auscultation bilaterally. GI: Soft, nontender, non-distended  MS: 1+ edema Neuro:  Nonfocal  Psych: Normal affect   Labs    High Sensitivity Troponin:   Recent Labs  Lab 03/11/22 1900  TROPONINIHS 35*     Chemistry Recent Labs  Lab 03/11/22 1048 03/12/22 0845 03/13/22 0308  NA 142 141 139  K 4.5 4.7 4.2  CL 110 105 102  CO2 '23 24 27  '$ GLUCOSE 54* 246* 227*  BUN '23 23 21  '$ CREATININE 1.32* 1.28* 1.26*  CALCIUM 9.5 9.2 9.2  PROT 6.1*  --   --   ALBUMIN 3.7  --   --   AST 27  --   --   ALT 27  --   --   ALKPHOS 44  --   --   BILITOT 1.1  --   --   GFRNONAA 57* 59* >60  ANIONGAP '9 12 10    '$ Hematology Recent Labs  Lab 03/11/22 1048 03/12/22 0845 03/13/22 0308  WBC 5.2 4.4 4.4  RBC 3.05* 2.65* 2.68*  HGB 9.7* 8.6* 8.5*  HCT 30.6* 25.9* 25.8*  MCV 100.3* 97.7 96.3  MCH 31.8 32.5 31.7  MCHC 31.7 33.2 32.9  RDW 14.6 14.5 14.3  PLT 168 146* 153   Thyroid  Recent Labs  Lab 03/11/22 1048  TSH 0.869  FREET4 0.83    BNP Recent Labs  Lab 03/11/22 1900  BNP 102.8*     Radiology    CT HEAD WO CONTRAST  Result Date: 03/11/2022 CLINICAL DATA:  Altered mental status. EXAM: CT HEAD WITHOUT CONTRAST TECHNIQUE: Contiguous axial images were obtained from the base of the skull through the vertex without intravenous contrast. RADIATION DOSE REDUCTION: This exam was performed according to the departmental dose-optimization program which includes automated exposure control, adjustment of the mA and/or kV according to patient size and/or use of iterative reconstruction technique. COMPARISON:  07/25/2021 and MRI from 08/13/2021 FINDINGS: Brain: The brainstem, cerebellum, cerebral peduncles, thalami, basal ganglia, basilar cisterns, and ventricular system appear within normal limits. No intracranial hemorrhage, mass lesion, or acute CVA. Periventricular white matter and corona radiata hypodensities favor chronic ischemic  microvascular white matter disease. Vascular: There is atherosclerotic calcification of the cavernous carotid arteries bilaterally. Skull: Unremarkable Sinuses/Orbits: Chronic bilateral maxillary, ethmoid, and frontal sinusitis. There is potentially a small air-fluid level in the right frontal sinus such that superimposed mild acute right frontal sinusitis is not excluded. Other: No supplemental non-categorized findings. IMPRESSION: 1. No acute intracranial findings. 2. Periventricular white matter and corona radiata hypodensities favor chronic ischemic microvascular white matter disease. 3. Chronic bilateral maxillary, ethmoid, and frontal sinusitis. There is potentially a small air-fluid level in the right frontal sinus such that superimposed mild acute right frontal sinusitis is not excluded. 4. Atherosclerosis. Electronically Signed   By: Van Clines M.D.   On: 03/11/2022 12:14   DG Chest Port 1 View  Result Date: 03/11/2022 CLINICAL DATA:  71 year old male with history of altered mental status. EXAM: PORTABLE CHEST 1 VIEW COMPARISON:  Chest x-ray 07/24/2021. FINDINGS: Lung volumes are low. Diffuse interstitial prominence and peribronchial cuffing, most evident throughout the left lung, particularly in the left lung base. Right lung appears relatively clear. No pleural effusions. No pneumothorax. No evidence of pulmonary edema. Heart size is borderline enlarged. Upper mediastinal contours are within normal limits. IMPRESSION: 1. Mild diffuse interstitial prominence and peribronchial cuffing, most severe in the left lung, particularly at the left lung base. Findings are concerning for bronchitis and probable developing left basilar bronchopneumonia. Electronically Signed   By: [name] M.D.   On: 03/11/2022 11:26      Patient Profile     71 year old male with past medical history of diabetes mellitus, hypertension, chronic diastolic congestive heart failure, autoimmune myopathy/myositis,  hypothyroidism, prior PE/DVT, chronic stage III kidney disease admitted with altered mental status/hypoglycemia who I am asked to evaluate for possible congestive heart failure.   Assessment & Plan    1 question acute on chronic diastolic congestive heart failure-lower extremity edema has improved.  We will continue Lasix IV today and transition to oral Lasix tomorrow.  Await follow-up echocardiogram.     2 history of cardiomyopathy-LV function has improved on most recent echocardiogram.  Continue losartan and carvedilol.  Await follow-up echocardiogram.   3 hypertension-continue present blood pressure medications and follow.  4 history of DVT-he is on Eliquis.   5 hyperlipidemia-continue statin.   6 upper respiratory infection-antibiotics per primary service.   7 acute metabolic encephalopathy-felt likely secondary to hypoglycemia.  Management per primary service.  For questions or updates, please contact [place] Please consult  [email or web address] for contact info under        Signed, Kirk Ruths, MD  03/13/2022, 10:43 AM

## 2022-03-13 NOTE — TOC Initial Note (Addendum)
Transition of Care [facility]) - Initial/Assessment Note    Patient Details  Name: [name] MRN: [id number] Date of Birth: [birth year]  Transition of Care [facility]) CM/SW Contact:    Carles Collet, RN Phone Number: 03/13/2022, 11:58 AM  Clinical Narrative:                  Damaris Schooner w patient's wife in hallway. She requested DM to speak w her about CBG monitor, CM relayed message and DM coordinator will be by later today to follow up. Patient is seen at [facility], per wife PCP is Dr Vevelyn Royals. Estella Husk CSW [phone] ext [phone] [facility] [place] [facility] [place] [address].  [facility], LVM to notify of admission.   Expected Discharge Plan: Home/Self Care Barriers to Discharge: Continued Medical Work up   Patient Goals and CMS Choice Patient states their goals for this hospitalization and ongoing recovery are:: to go home      Expected Discharge Plan and Services Expected Discharge Plan: Home/Self Care   Discharge Planning Services: CM Consult                                          Prior Living Arrangements/Services   Lives with:: Spouse                   Activities of Daily Living Home Assistive Devices/Equipment: None ADL Screening (condition at time of admission) Patient's cognitive ability adequate to safely complete daily activities?: Yes Is the patient deaf or have difficulty hearing?: No Does the patient have difficulty seeing, even when wearing glasses/contacts?: No Does the patient have difficulty concentrating, remembering, or making decisions?: No Patient able to express need for assistance with ADLs?: Yes Does the patient have difficulty dressing or bathing?: No Independently performs ADLs?: Yes (appropriate for developmental age) Does the patient have difficulty walking or climbing stairs?: Yes Weakness of Legs: Both Weakness of Arms/Hands: None  Permission Sought/Granted                   Emotional Assessment              Admission diagnosis:  Hypoglycemia [E16.2] Altered mental status, unspecified altered mental status type [W62.03] Acute metabolic encephalopathy [T59.74] Community acquired pneumonia of left lower lobe of lung [J18.9] Patient Active Problem List   Diagnosis Date Noted   Acute metabolic encephalopathy 73/38/4536   RSV (respiratory syncytial virus pneumonia) 03/11/2022   Sinusitis, acute frontal 73/80/3212   Diastolic congestive heart failure ([place]) 03/11/2022   CKD (chronic kidney disease), stage III ([place]) 03/11/2022   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   History of DVT (deep vein thrombosis) 03/11/2022   History of pulmonary embolism 03/11/2022   Hyperlipidemia 03/11/2022   Obesity (BMI 30-39.9) 03/11/2022   CAD (coronary artery disease) 01/30/2022   Depression 01/30/2022   Cardiomyopathy ([place]) 01/30/2022   Posttraumatic stress disorder 01/30/2022   Rheumatoid arthritis ([place]) 08/09/2021   New onset of headaches after age 71 08/09/2021   Non-ST elevation (NSTEMI) myocardial infarction [facility])    Acute combined systolic and diastolic heart failure ([place])    Acute pulmonary embolism ([place]) 07/22/2021   Acute DVT (deep venous thrombosis) ([place]) 07/22/2021   Acute kidney injury superimposed on CKD stage3a  07/22/2021   Lactic acidosis 07/22/2021   Elevated troponin 07/22/2021  Acute respiratory failure with hypoxia ([facility]) 07/22/2021   Anxiety 07/22/2021   Ankle swelling 01/17/2020   Fatigue 12/12/2018   GERD (gastroesophageal reflux disease) 06/04/2018   Hypogonadism in male 04/01/2018   Macrocytic anemia 09/30/2017   Itching 09/30/2017   Hypothyroidism 09/30/2017   Low testosterone 09/30/2017   Benign prostatic hyperplasia without lower urinary tract symptoms 09/30/2017   Erectile dysfunction 03/19/2016   Benign essential HTN 08/08/2015   Branch retinal vein occlusion with macular edema of left eye 03/25/2015   Nuclear sclerotic cataract  of both eyes 03/25/2015   Myositis 09/28/2014   DM2 (diabetes mellitus, type 2) ([place]) 09/28/2014   Abnormal glucose 10/04/2012   PCP:  [name], DO Pharmacy[facility]   [id number] [place] [place] [address]Phone: [phone]: [phone] [id number] [place] [address] [place]hone: [phone]: [phone]   Social Determinants of Health (SDOH) Interventions    Readmission Risk Interventions    07/27/2021   10:06 AM 07/26/2021    1:16 PM  Readmission Risk Prevention Plan  Transportation Screening Complete Complete  PCP or Specialist Appt within 5-7 Days  Complete  PCP or Specialist Appt within 3-5 Days Complete   Home Care Screening  Complete  Medication Review (RN CM)  Complete  HRI or Home Care Consult Complete   Social Work Consult for [place]/Counseling Complete   Palliative Care Screening Not Applicable   Medication Review (RPress photographerComplete

## 2022-03-14 ENCOUNTER — Inpatient Hospital Stay (HOSPITAL_COMMUNITY): Payer: No Typology Code available for payment source

## 2022-03-14 DIAGNOSIS — G9341 Metabolic encephalopathy: Secondary | ICD-10-CM | POA: Diagnosis not present

## 2022-03-14 DIAGNOSIS — E1165 Type 2 diabetes mellitus with hyperglycemia: Secondary | ICD-10-CM | POA: Diagnosis not present

## 2022-03-14 DIAGNOSIS — J0111 Acute recurrent frontal sinusitis: Secondary | ICD-10-CM | POA: Diagnosis not present

## 2022-03-14 DIAGNOSIS — J121 Respiratory syncytial virus pneumonia: Secondary | ICD-10-CM | POA: Diagnosis not present

## 2022-03-14 DIAGNOSIS — I5031 Acute diastolic (congestive) heart failure: Secondary | ICD-10-CM

## 2022-03-14 LAB — GLUCOSE, CAPILLARY
Glucose-Capillary: 203 mg/dL — ABNORMAL HIGH (ref 70–99)
Glucose-Capillary: 241 mg/dL — ABNORMAL HIGH (ref 70–99)
Glucose-Capillary: 241 mg/dL — ABNORMAL HIGH (ref 70–99)
Glucose-Capillary: 253 mg/dL — ABNORMAL HIGH (ref 70–99)
Glucose-Capillary: 291 mg/dL — ABNORMAL HIGH (ref 70–99)
Glucose-Capillary: 341 mg/dL — ABNORMAL HIGH (ref 70–99)
Glucose-Capillary: 373 mg/dL — ABNORMAL HIGH (ref 70–99)
Glucose-Capillary: 401 mg/dL — ABNORMAL HIGH (ref 70–99)

## 2022-03-14 LAB — CBC WITH DIFFERENTIAL/PLATELET
Abs Immature Granulocytes: 0.03 10*3/uL (ref 0.00–0.07)
Basophils Absolute: 0 10*3/uL (ref 0.0–0.1)
Basophils Relative: 0 %
Eosinophils Absolute: 0.1 10*3/uL (ref 0.0–0.5)
Eosinophils Relative: 2 %
HCT: 26.2 % — ABNORMAL LOW (ref 39.0–52.0)
Hemoglobin: 8.4 g/dL — ABNORMAL LOW (ref 13.0–17.0)
Immature Granulocytes: 1 %
Lymphocytes Relative: 21 %
Lymphs Abs: 0.8 10*3/uL (ref 0.7–4.0)
MCH: 31.5 pg (ref 26.0–34.0)
MCHC: 32.1 g/dL (ref 30.0–36.0)
MCV: 98.1 fL (ref 80.0–100.0)
Monocytes Absolute: 0.2 10*3/uL (ref 0.1–1.0)
Monocytes Relative: 5 %
Neutro Abs: 2.7 10*3/uL (ref 1.7–7.7)
Neutrophils Relative %: 71 %
Platelets: 176 10*3/uL (ref 150–400)
RBC: 2.67 MIL/uL — ABNORMAL LOW (ref 4.22–5.81)
RDW: 14.5 % (ref 11.5–15.5)
WBC: 3.7 10*3/uL — ABNORMAL LOW (ref 4.0–10.5)
nRBC: 0 % (ref 0.0–0.2)

## 2022-03-14 LAB — BASIC METABOLIC PANEL
Anion gap: 11 (ref 5–15)
BUN: 23 mg/dL (ref 8–23)
CO2: 25 mmol/L (ref 22–32)
Calcium: 8.9 mg/dL (ref 8.9–10.3)
Chloride: 104 mmol/L (ref 98–111)
Creatinine, Ser: 1.32 mg/dL — ABNORMAL HIGH (ref 0.61–1.24)
GFR, Estimated: 57 mL/min — ABNORMAL LOW (ref >60)
Glucose, Bld: 267 mg/dL — ABNORMAL HIGH (ref 70–99)
Potassium: 4.2 mmol/L (ref 3.5–5.1)
Sodium: 140 mmol/L (ref 135–145)

## 2022-03-14 MED ORDER — AZITHROMYCIN 500 MG PO TABS
500.0000 mg | ORAL_TABLET | Freq: Every day | ORAL | Status: DC
  Administered 2022-03-14 – 2022-03-15 (×2): 500 mg via ORAL
  Filled 2022-03-14 (×2): qty 1

## 2022-03-14 MED ORDER — TORSEMIDE 20 MG PO TABS
20.0000 mg | ORAL_TABLET | Freq: Every day | ORAL | Status: DC
  Administered 2022-03-14: 20 mg via ORAL
  Filled 2022-03-14: qty 1

## 2022-03-14 MED ORDER — FOLIC ACID 1 MG PO TABS
2.0000 mg | ORAL_TABLET | Freq: Every day | ORAL | Status: DC
  Administered 2022-03-14 – 2022-03-15 (×2): 2 mg via ORAL
  Filled 2022-03-14 (×2): qty 2

## 2022-03-14 MED ORDER — CARVEDILOL 6.25 MG PO TABS
6.2500 mg | ORAL_TABLET | Freq: Two times a day (BID) | ORAL | Status: DC
  Administered 2022-03-15: 6.25 mg via ORAL
  Filled 2022-03-14 (×2): qty 1

## 2022-03-14 MED ORDER — PREDNISONE 5 MG PO TABS
15.0000 mg | ORAL_TABLET | Freq: Every day | ORAL | Status: DC
  Administered 2022-03-15: 15 mg via ORAL
  Filled 2022-03-14: qty 3

## 2022-03-14 MED ORDER — IPRATROPIUM-ALBUTEROL 0.5-2.5 (3) MG/3ML IN SOLN
3.0000 mL | Freq: Four times a day (QID) | RESPIRATORY_TRACT | Status: DC
  Administered 2022-03-14 – 2022-03-15 (×5): 3 mL via RESPIRATORY_TRACT
  Filled 2022-03-14 (×5): qty 3

## 2022-03-14 MED ORDER — INSULIN GLARGINE-YFGN 100 UNIT/ML ~~LOC~~ SOLN
28.0000 [IU] | Freq: Every day | SUBCUTANEOUS | Status: DC
  Administered 2022-03-14: 28 [IU] via SUBCUTANEOUS
  Filled 2022-03-14 (×2): qty 0.28

## 2022-03-14 MED ORDER — INSULIN GLARGINE-YFGN 100 UNIT/ML ~~LOC~~ SOLN
6.0000 [IU] | Freq: Once | SUBCUTANEOUS | Status: AC
  Administered 2022-03-14: 6 [IU] via SUBCUTANEOUS
  Filled 2022-03-14: qty 0.06

## 2022-03-14 MED ORDER — METHYLPREDNISOLONE SODIUM SUCC 125 MG IJ SOLR
80.0000 mg | Freq: Once | INTRAMUSCULAR | Status: AC
  Administered 2022-03-14: 80 mg via INTRAVENOUS
  Filled 2022-03-14: qty 2

## 2022-03-14 MED ORDER — ROPINIROLE HCL 1 MG PO TABS
0.5000 mg | ORAL_TABLET | Freq: Three times a day (TID) | ORAL | Status: DC
  Administered 2022-03-14 – 2022-03-15 (×2): 0.5 mg via ORAL
  Filled 2022-03-14 (×2): qty 1

## 2022-03-14 MED ORDER — INSULIN GLARGINE-YFGN 100 UNIT/ML ~~LOC~~ SOLN
36.0000 [IU] | Freq: Every day | SUBCUTANEOUS | Status: DC
  Administered 2022-03-15: 36 [IU] via SUBCUTANEOUS
  Filled 2022-03-14: qty 0.36

## 2022-03-14 NOTE — Progress Notes (Signed)
PROGRESS NOTE  [name]  DOB: 09/24/1950  PCP: [name], DO WJX:[id number]  DOA: 03/11/2022  LOS: 3 days  Hospital Day: 4  Brief narrative: [name] is a 71 y.o. male with PMH significant for DM2, HTN, diastolic CHF, autoimmune myopathy/myositis, hypothyroidism, PE/DVT, and CKD stage III . 11/12, patient was brought to the ED from home for altered mental status.  The day prior, they had driven their son back to [employer] and on the way back she noticed that he started speaking in third person. Prior to their son getting home patient had had a sinus infection which was treated with antibiotics and had resolved.  His wife thinks that patient's son may have brought some back from school as he is started coughing since he had been home.  Patient had been complaining of a headache and congestion.  In the ED, patient was afebrile, heart rate 90s, blood pressure 100s Labs with WC count normal, hemoglobin low at 9.7, creatinine elevated to 1.32, ammonia slightly elevated to 48, CK total elevated 1500, troponin elevated 35. CT head did not show any acute intracranial findings.  Showed periventricular white matter coronary due to hypodensities Favor chronic ischemic microvascular white matter disease.  It also showed chronic bilateral maxillary ethmoid and frontal sinusitis, potentially a small air-fluid level of the right frontal sinus suggestive of mild acute right frontal sinusitis. Chest x-ray showed mild diffuse interstitial prominence and peribronchial cuffing, most severe at the left lung base.  Findings concerning for bronchitis and probable developing left basilar bronchopneumonia. RVP positive for RSV. Patient was started on IV Rocephin, azithromycin Admitted to hospitalist service  Subjective:  - Still having hoarse voice, had an episode of low blood pressure yesterday, still complaining of cough, productive phlegm.  Assessment and plan:  RSV pneumonia Left basilar  bronchopneumonia After his son returned home from school, patient started to have cough, headache and congestion. RVP positive for RSV Chest x-ray as above suggestive of left basilar bronchopneumonia. WBC count, lactic acid normal.  Procalcitonin slightly elevated. Currently on IV Rocephin, IV azithromycin.  Continue the same. Continue Mucinex, bronchodilators Will encourage use incentive spirometry and flutter valve -Significant wheezing and diminished air entry today, will give 1 dose of IV Solu-Medrol  Recent Labs  Lab 03/11/22 1048 03/11/22 1900 03/12/22 0845 03/13/22 0308 03/14/22 0344  WBC 5.2  --  4.4 4.4 3.7*  LATICACIDVEN 1.7  --   --   --   --   PROCALCITON  --  0.29  --   --   --     Recent sinusitis Per history, few days prior patient had had a sinus infection which was treated with antibiotics and had resolved.   On exam, patient has sinus tenderness over frontal area. CT scan of the brain noted concern for acute sinusitis.  Already on antibiotics Add Tessalon Perles   Acute exacerbation of diastolic CHF Essential hypertension Wife reported grossly worsening lip swelling and 8 to 10 pound weight gain recently.   Last EF noted to be 55-60% with grade 1 diastolic dysfunction by echocardiogram 11/2021. PTA on Coreg 12.5 mg twice daily, Entresto 24-26 mg half tablet twice daily, spironolactone 12.5 mg daily, and furosemide 20 mg daily as needed for fluid retention.   Currently on Lasix 40 mg IV twice daily.  Cardiology following.  CHF meds per cardiology. Net IO Since Admission: -743.23 mL [03/14/22 1411] Continue to monitor for daily intake output, weight, blood pressure, BNP, renal function and electrolytes. -Diuresis per  cardiology, changed from IV lasix to torsemide. Recent Labs  Lab 03/11/22 1048 03/11/22 1900 03/12/22 0845 03/13/22 0308 03/14/22 0344  BNP  --  102.8*  --   --   --   BUN 23  --  '23 21 23  '$ CREATININE 1.32*  --  1.28* 1.26* 1.32*  K 4.5  --   4.7 4.2 4.2   Type 2 diabetes mellitus with hypoglycemia A1c 9 on 03/11/2022 On admission, glucose level was noted to be low at 49.  Blood glucose level fluctuating as high as 256 in the last 24 hours. PTA on Lantus 44 units a.m./44 units p.m., glipizide 20 mg daily, metformin 1000 mg a.m/500 mg p.m., acarbose 100 mg 3 times daily. BG are elevated, and anticipate it will increase given he will need higher dose of steroids, so I will increase his Semglee to 36 daily and 28. -  Oral meds on hold. Continue sliding scale insulin with Accu-Cheks Recent Labs  Lab 03/13/22 2351 03/14/22 0437 03/14/22 0753 03/14/22 0921 03/14/22 1334  GLUCAP 291* 241* 203* 253* 241*   Chronic kidney disease stage IIIa Creatinine 1.32 which appears around patient's baseline. Continue to monitor kidney function with diuresis Recent Labs    07/27/21 0310 08/09/21 0000 09/05/21 1037 10/09/21 1123 01/09/22 1304 01/30/22 0000 03/11/22 1048 03/12/22 0845 03/13/22 0308 03/14/22 0344  BUN '14 24 24 20 26 13 23 23 21 23  '$ CREATININE 1.39* 1.18 1.14 1.11 1.35* 1.20 1.32* 1.28* 1.26* 9.20*   Acute metabolic encephalopathy Presented with altered mental status  CT head did not show any acute intracranial abnormality.  Altered mentation likely due to pneumonia.  See management pneumonia below.  Mental status gradually improving.  CAD H/o NSTEMI HLD Coronary CT in May noted no signs of significant stenosis in the proximal/proximal LAD. Continue Eliquis and statin.   Autoimmune myopathy  rheumatoid arthritis Patient is on methotrexate and chronic prednisone.  He is followed by rheumatology in the outpatient setting. CK level was initially elevated to 1500.  Down to 606 today. Continue prednisone and resume methotrexate in outpatient setting Continue tramadol as needed for pain  Steroid-dependent -Patient been chronically on large dose prednisone for many years, will give 1 dose of IV Solu-Medrol today,  and likely will increase his prednisone over next few days as well. Recent Labs  Lab 03/11/22 1900 03/13/22 0308  CKTOTAL 1,504* 606*   Elevated ammonia level Unclear etiology.  No finding of liver cirrhosis and the most recent imaging from March 2023 Not on Depakote.  Repeat ammonia level showed an improvement to 29. Recent Labs  Lab 03/11/22 1048 03/12/22 0845  AMMONIA 48* 29    History of DVT/PE on chronic anticoagulation In March 2023, patient had saddle pulm embolism, right heart strain and right lower EXTR DVT for which she underwent thrombectomy by IR.  VTE was thought possibly provoked due to patient being on testosterone injections and impaired mobility due to myositis. Chronically anticoagulated with Eliquis.  Continue the same.  Macrocytic anemia Hemoglobin at baseline close to 9.  No active bleeding. Recent Labs    01/30/22 0000 03/11/22 1048 03/12/22 0845 03/13/22 0308 03/14/22 0344  HGB 10.0* 9.7* 8.6* 8.5* 8.4*  MCV 97.1 100.3* 97.7 96.3 98.1  VITAMINB12 207  --   --   --   --   FOLATE >24.0  --   --   --   --   FERRITIN 316  --   --   --   --  TIBC 230*  --   --   --   --   IRON 108  --   --   --   --     Hypothyroidism TSH 0.869 Continue levothyroxine   Morbid obesity  -Body mass index is 31.98 kg/m. Patient has been advised to make an attempt to improve diet and exercise patterns to aid in weight loss.  Impaired mobility Limited mobility because of bilateral lower extremity edema as well as myositis. Continue as needed tramadol.  PT eval ordered.  Goals of care   Code Status: Full Code   Skin assessment:     Nutritional status:  Body mass index is 31.98 kg/m.          Diet:  Diet Order             Diet heart healthy/carb modified Room service appropriate? Yes; Fluid consistency: Thin  Diet effective now                   DVT prophylaxis:   apixaban (ELIQUIS) tablet 5 mg   Antimicrobials: Rocephin, azithromycin Fluid:  None Consultants: Cardiology  Family Communication: Wife at bedside  Status is: Inpatient  Continue in-hospital care because: On IV antibiotics, IV diuresis, pending PT eval Level of care: Telemetry Medical   Dispo: The patient is from: Home              Anticipated d/c is to: Pending clinical course              Patient currently is not medically stable to d/c.   Difficult to place patient No     Infusions:   cefTRIAXone (ROCEPHIN)  IV 2 g (03/14/22 1329)    Scheduled Meds:  apixaban  5 mg Oral BID   atorvastatin  80 mg Oral QPM   azithromycin  500 mg Oral Daily   benzonatate  100 mg Oral TID   carvedilol  6.25 mg Oral BID   cycloSPORINE  1 drop Both Eyes BID   folic acid  2 mg Oral Daily   guaiFENesin  600 mg Oral BID   insulin aspart  0-15 Units Subcutaneous TID WC   insulin aspart  0-5 Units Subcutaneous QHS   insulin glargine-yfgn  25 Units Subcutaneous QHS   insulin glargine-yfgn  30 Units Subcutaneous Daily   ipratropium-albuterol  3 mL Nebulization QID   levothyroxine  75 mcg Oral Daily   loratadine  10 mg Oral Daily   losartan  25 mg Oral Daily   polyethylene glycol  17 g Oral Daily   [START ON 03/15/2022] predniSONE  15 mg Oral Q breakfast   QUEtiapine  25 mg Oral QHS   sodium chloride flush  3 mL Intravenous Q12H   torsemide  20 mg Oral Daily    PRN meds: acetaminophen **OR** acetaminophen, albuterol, dextrose, guaiFENesin-dextromethorphan, ondansetron **OR** ondansetron (ZOFRAN) IV, pantoprazole, polyvinyl alcohol, traMADol, triamcinolone cream   Antimicrobials: Anti-infectives (From admission, onward)    Start     Dose/Rate Route Frequency Ordered Stop   03/14/22 1100  azithromycin (ZITHROMAX) tablet 500 mg        500 mg Oral Daily 03/14/22 1002     03/12/22 1300  cefTRIAXone (ROCEPHIN) 2 g in sodium chloride 0.9 % 100 mL IVPB        2 g 200 mL/hr over 30 Minutes Intravenous Every 24 hours 03/11/22 1501 03/16/22 1259   03/12/22 1300  azithromycin  (ZITHROMAX) 500 mg in sodium chloride 0.9 %  250 mL IVPB  Status:  Discontinued        500 mg 250 mL/hr over 60 Minutes Intravenous Every 24 hours 03/11/22 1501 03/14/22 1002   03/11/22 1300  cefTRIAXone (ROCEPHIN) 1 g in sodium chloride 0.9 % 100 mL IVPB  Status:  Discontinued        1 g 200 mL/hr over 30 Minutes Intravenous  Once 03/11/22 1257 03/11/22 1257   03/11/22 1300  azithromycin (ZITHROMAX) 500 mg in sodium chloride 0.9 % 250 mL IVPB        500 mg 250 mL/hr over 60 Minutes Intravenous  Once 03/11/22 1257 03/11/22 1426   03/11/22 1300  cefTRIAXone (ROCEPHIN) 2 g in sodium chloride 0.9 % 100 mL IVPB        2 g 200 mL/hr over 30 Minutes Intravenous  Once 03/11/22 1257 03/11/22 1341       Objective: Vitals:   03/14/22 0908 03/14/22 1200  BP: 120/64 97/78  Pulse: 91 97  Resp:  19  Temp:  98.3 F (36.8 C)  SpO2:  100%    Intake/Output Summary (Last 24 hours) at 03/14/2022 1411 Last data filed at 03/14/2022 1003 Gross per 24 hour  Intake 3 ml  Output 700 ml  Net -697 ml   Filed Weights   03/11/22 1100 03/14/22 0434  Weight: 104.3 kg 95.4 kg   Weight change:  Body mass index is 31.98 kg/m.   Physical Exam:   Awake Alert, Oriented X 3, No new F.N deficits, Normal affect Symmetrical Chest wall movement, diminished air entry with wheezing RRR,No Gallops,Rubs or new Murmurs, No Parasternal Heave +ve B.Sounds, Abd Soft, No tenderness, No rebound - guarding or rigidity. No Cyanosis, Clubbing, improving lower extremity edema, tenderness due to chronic myositis   Data Review: I have personally reviewed the laboratory data and studies available.  F/u labs ordered Unresulted Labs (From admission, onward)     Start     Ordered   03/13/22 6720  Basic metabolic panel  Daily at 5am,   R      03/12/22 1254   03/13/22 0500  CBC with Differential/Platelet  Daily at 5am,   R      03/12/22 1254            Signed, Phillips Climes, MD [facility] 03/14/2022

## 2022-03-14 NOTE — Inpatient Diabetes Management (Signed)
Inpatient Diabetes Program Recommendations  AACE/ADA: New Consensus Statement on Inpatient Glycemic Control   Target Ranges:  Prepandial:   less than 140 mg/dL      Peak postprandial:   less than 180 mg/dL (1-2 hours)      Critically ill patients:  140 - 180 mg/dL    Latest Reference Range & Units 03/14/22 04:37 03/14/22 07:53 03/14/22 09:21  Glucose-Capillary 70 - 99 mg/dL 241 (H) 203 (H) 253 (H)    Latest Reference Range & Units 03/13/22 08:22 03/13/22 12:03 03/13/22 16:33 03/13/22 20:37 03/13/22 23:51  Glucose-Capillary 70 - 99 mg/dL 188 (H) 285 (H) 417 (H) 283 (H) 291 (H)   Review of Glycemic Control  Diabetes history: DM2 Outpatient Diabetes medications: Lantus 44 units QAM, Lantus 42 units QPM, Glucotrol 20 mg daily, Metformin 1000 mg QAM, Metformin 500 mg QPM Current orders for Inpatient glycemic control: Semglee 30 units QAM, Semglee 25 units QHS, Novolog 0-15 units TID with meals, Novolog 0-5 units QHS; Prednisone 15 mg daily   Inpatient Diabetes Program Recommendations:    Insulin: If steroids are continued, please consider increasing Semglee to 35 units QAM, Semglee to 28 units QHS, and ordering Novolog 4 units TID with meals for meal coverage if patient eats at least 50% of meals.  Outpatient DM: Patient will need Rx for Dexcom from Endocrinology Peri Jefferson, PA-C) as he will need to be educated on how to apply and use the Dexcom CGM.   NOTE: Patient sees [facility] Endocrinology for DM management and last seen Peri Jefferson, PA-C on 02/12/22. Per note by Lyndel Safe, CPhT on 03/13/22, Prior Authorization for [employer], Clinical biochemist) has been approved.    PA# [id number] Effective dates: 03/13/2022 through 03/14/2023.   Thanks, Barnie Alderman, RN, MSN, [place] Diabetes Coordinator Inpatient Diabetes Program [phone] (Team Pager from 8am to [place])

## 2022-03-14 NOTE — Progress Notes (Signed)
  Echocardiogram 2D Echocardiogram has been performed.  [name] 03/14/2022, 5:08 PM

## 2022-03-14 NOTE — Progress Notes (Addendum)
Rounding Note    Patient Name: [name] Date of Encounter: 03/14/2022  [place] Cardiologist: Pixie Casino, MD   Subjective   Voice is still coarse. Denies any CP or significant dyspnea. BP dropped to 78 yesterday. Want to shower  Inpatient Medications    Scheduled Meds:  apixaban  5 mg Oral BID   atorvastatin  80 mg Oral QPM   benzonatate  100 mg Oral TID   carvedilol  12.5 mg Oral BID   cycloSPORINE  1 drop Both Eyes BID   guaiFENesin  600 mg Oral BID   insulin aspart  0-15 Units Subcutaneous TID WC   insulin aspart  0-5 Units Subcutaneous QHS   insulin glargine-yfgn  25 Units Subcutaneous QHS   insulin glargine-yfgn  30 Units Subcutaneous Daily   levothyroxine  75 mcg Oral Daily   loratadine  10 mg Oral Daily   losartan  25 mg Oral Daily   polyethylene glycol  17 g Oral Daily   predniSONE  15 mg Oral Daily   QUEtiapine  25 mg Oral QHS   sodium chloride flush  3 mL Intravenous Q12H   Continuous Infusions:  azithromycin 500 mg (03/13/22 1445)   cefTRIAXone (ROCEPHIN)  IV 2 g (03/13/22 1259)   PRN Meds: acetaminophen **OR** acetaminophen, albuterol, dextrose, guaiFENesin-dextromethorphan, ondansetron **OR** ondansetron (ZOFRAN) IV, pantoprazole, polyvinyl alcohol, traMADol, triamcinolone cream   Vital Signs    Vitals:   03/13/22 1900 03/13/22 2000 03/13/22 2349 03/14/22 0434  BP:  (!) 89/58 (!) 108/59 115/60  Pulse: 94 88 92 89  Resp: '17 18 18 18  '$ Temp:  98.1 F (36.7 C) (!) 97.3 F (36.3 C) 98.3 F (36.8 C)  TempSrc:  Oral Oral Oral  SpO2: 96% 95% 93% 96%  Weight:    95.4 kg  Height:        Intake/Output Summary (Last 24 hours) at 03/14/2022 0757 Last data filed at 03/13/2022 0939 Gross per 24 hour  Intake 3 ml  Output 1750 ml  Net -1747 ml      03/14/2022    4:34 AM 03/11/2022   11:00 AM 02/20/2022    1:00 PM  Last 3 Weights  Weight (lbs) 210 lb 5.1 oz 230 lb 210 lb  Weight (kg) 95.4 kg 104.327 kg 95.255 kg       Telemetry    NSR without significant ventricular ectopy - Personally Reviewed  ECG    NSR with PACs - Personally Reviewed  Physical Exam   GEN: No acute distress.   Neck: No JVD Cardiac: RRR, no murmurs, rubs, or gallops.  Respiratory: diffusely diminished breath sound with minimal expiratory wheezing in the left base, no crackles or rhonchi.  GI: Soft, nontender, non-distended  MS: No edema; No deformity. Neuro:  Nonfocal  Psych: Normal affect   Labs    High Sensitivity Troponin:   Recent Labs  Lab 03/11/22 1900  TROPONINIHS 35*     Chemistry Recent Labs  Lab 03/11/22 1048 03/12/22 0845 03/13/22 0308 03/14/22 0344  NA 142 141 139 140  K 4.5 4.7 4.2 4.2  CL 110 105 102 104  CO2 '23 24 27 25  '$ GLUCOSE 54* 246* 227* 267*  BUN '23 23 21 23  '$ CREATININE 1.32* 1.28* 1.26* 1.32*  CALCIUM 9.5 9.2 9.2 8.9  PROT 6.1*  --   --   --   ALBUMIN 3.7  --   --   --   AST 27  --   --   --  ALT 27  --   --   --   ALKPHOS 44  --   --   --   BILITOT 1.1  --   --   --   GFRNONAA 57* 59* >60 57*  ANIONGAP '9 12 10 11    '$ Lipids No results for input(s): "CHOL", "TRIG", "HDL", "LABVLDL", "LDLCALC", "CHOLHDL" in the last 168 hours.  Hematology Recent Labs  Lab 03/12/22 0845 03/13/22 0308 03/14/22 0344  WBC 4.4 4.4 3.7*  RBC 2.65* 2.68* 2.67*  HGB 8.6* 8.5* 8.4*  HCT 25.9* 25.8* 26.2*  MCV 97.7 96.3 98.1  MCH 32.5 31.7 31.5  MCHC 33.2 32.9 32.1  RDW 14.5 14.3 14.5  PLT 146* 153 176   Thyroid  Recent Labs  Lab 03/11/22 1048  TSH 0.869  FREET4 0.83    BNP Recent Labs  Lab 03/11/22 1900  BNP 102.8*    DDimer No results for input(s): "DDIMER" in the last 168 hours.   Radiology    No results found.  Cardiac Studies   Echo 12/20/2021  1. Left ventricular ejection fraction, by estimation, is 55 to 60%. The  left ventricle has normal function. The left ventricle demonstrates  regional wall motion abnormalities (see scoring diagram/findings for  description).  Left ventricular diastolic  parameters are consistent with Grade I diastolic dysfunction (impaired  relaxation). There is mild hypokinesis of the left ventricular, apical  septal wall.   2. Right ventricular systolic function is normal. The right ventricular  size is normal.   3. The mitral valve is normal in structure. No evidence of mitral valve  regurgitation. No evidence of mitral stenosis.   4. The aortic valve is tricuspid. There is mild calcification of the  aortic valve. Aortic valve regurgitation is not visualized. Aortic valve  sclerosis/calcification is present, without any evidence of aortic  stenosis.   5. There is mild dilatation of the ascending aorta, measuring 42 mm.   6. The inferior vena cava is normal in size with greater than 50%  respiratory variability, suggesting right atrial pressure of 3 mmHg.   Comparison(s): The left ventricular function has improved. The left  ventricular wall motion improved.   Patient Profile     71 y.o. male with PMH of HTN, HLD, DM II, chronic diastolic CHF, autoimmune myopathy/myositis, hypothyroidism, prior PE/DVT and CKD admitted with AMS/hypoglycemia. Cardiology consulted for possible CHF  Assessment & Plan    Acute on chronic diastolic CHF  - EF 45-80% on echo 07/23/2021  - Echo 12/20/2021 EF 55-60%, grade 1 DD, mildly dilated aorta measuring 42 mm  - Pending echocardiogram. Appears to be euvolemic on exam. Still has diffusely diminished breath sound in the lung, however suspect it is more pulmonary rather than cardiac etiology. Wife mentioned his lasix was changed to PRN as scheduled lasix did not work for his leg edema (although IV lasix clearly reduced leg edema), may try a trial of 10 or '20mg'$  daily torsemide with BMET in 5 days. Home spironolactone also held.   Elevated troponin: likely demand ischemia due to underlying URI/infection. Recent coronary CT obtained in May 2023 showed 25-49% ost/prox LAD, 1-24% LM lesion.   H/o  cardiomyopathy: EF normalized on echo in August, pending repeat echo.   HTN: coreg, losartan  HLD  H/o DVT: on Eliquis  URI: positive for RSV. CXR concerning for developing L basilar bronchopenumonia  Acute metabolic encephalopathy: felt secondary to hypoglycemia   For questions or updates, please contact [place] Please consult  [email or web address] for contact info under   Signed, Almyra Deforest, [place]  03/14/2022, 7:57 AM   As above, patient seen and examined.  He complains of general myalgias and continued cough.  He denies chest pain.  Blood pressure was low last evening.  We will decrease carvedilol to 6.25 mg twice daily.  Discontinue IV Lasix and instead treat with Demadex 20 mg daily.  Follow renal function.  Await echocardiogram.  If LV function normal do not anticipate additional cardiac evaluation. Kirk Ruths, MD

## 2022-03-14 NOTE — Progress Notes (Signed)
This patient is receiving the antibiotic azithromycin by the intravenous route.   Based on criteria approved by the Pharmacy and Therapeutics Committee, and the  Infectious Disease Division, the antibiotic(s) is / are being converted to equivalent oral dose form(s). These criteria include:  Patient being treated for a respiratory tract infection, urinary tract infection, cellulitis, or Clostridium Difficile associated diarrhea  The patient is not neutropenic and does not exhibit a GI malabsorption state  The patient is eating (either orally or per tube) and/or has been taking other orally administered medications for at least 24 hours.  The patient is improving clinically (physician assessment and a 24-hour Tmax of 100.5 F).   If you have questions about this conversion, please contact the pharmacy department.   Thank you for allowing pharmacy to be a part of this patient's care.  Ardyth Harps, PharmD Clinical Pharmacist

## 2022-03-15 ENCOUNTER — Other Ambulatory Visit (HOSPITAL_COMMUNITY): Payer: Self-pay

## 2022-03-15 DIAGNOSIS — G9341 Metabolic encephalopathy: Secondary | ICD-10-CM | POA: Diagnosis not present

## 2022-03-15 DIAGNOSIS — E1165 Type 2 diabetes mellitus with hyperglycemia: Secondary | ICD-10-CM | POA: Diagnosis not present

## 2022-03-15 DIAGNOSIS — J0111 Acute recurrent frontal sinusitis: Secondary | ICD-10-CM | POA: Diagnosis not present

## 2022-03-15 DIAGNOSIS — J121 Respiratory syncytial virus pneumonia: Secondary | ICD-10-CM | POA: Diagnosis not present

## 2022-03-15 LAB — BASIC METABOLIC PANEL
Anion gap: 13 (ref 5–15)
BUN: 27 mg/dL — ABNORMAL HIGH (ref 8–23)
CO2: 26 mmol/L (ref 22–32)
Calcium: 9.1 mg/dL (ref 8.9–10.3)
Chloride: 99 mmol/L (ref 98–111)
Creatinine, Ser: 1.45 mg/dL — ABNORMAL HIGH (ref 0.61–1.24)
GFR, Estimated: 51 mL/min — ABNORMAL LOW (ref >60)
Glucose, Bld: 321 mg/dL — ABNORMAL HIGH (ref 70–99)
Potassium: 4.5 mmol/L (ref 3.5–5.1)
Sodium: 138 mmol/L (ref 135–145)

## 2022-03-15 LAB — GLUCOSE, CAPILLARY
Glucose-Capillary: 291 mg/dL — ABNORMAL HIGH (ref 70–99)
Glucose-Capillary: 286 mg/dL — ABNORMAL HIGH (ref 70–99)

## 2022-03-15 LAB — CBC WITH DIFFERENTIAL/PLATELET
Abs Immature Granulocytes: 0.09 10*3/uL — ABNORMAL HIGH (ref 0.00–0.07)
Basophils Absolute: 0 10*3/uL (ref 0.0–0.1)
Basophils Relative: 0 %
Eosinophils Absolute: 0 10*3/uL (ref 0.0–0.5)
Eosinophils Relative: 0 %
HCT: 26.5 % — ABNORMAL LOW (ref 39.0–52.0)
Hemoglobin: 8.9 g/dL — ABNORMAL LOW (ref 13.0–17.0)
Immature Granulocytes: 2 %
Lymphocytes Relative: 11 %
Lymphs Abs: 0.6 10*3/uL — ABNORMAL LOW (ref 0.7–4.0)
MCH: 32 pg (ref 26.0–34.0)
MCHC: 33.6 g/dL (ref 30.0–36.0)
MCV: 95.3 fL (ref 80.0–100.0)
Monocytes Absolute: 0.3 10*3/uL (ref 0.1–1.0)
Monocytes Relative: 6 %
Neutro Abs: 5 10*3/uL (ref 1.7–7.7)
Neutrophils Relative %: 81 %
Platelets: 171 10*3/uL (ref 150–400)
RBC: 2.78 MIL/uL — ABNORMAL LOW (ref 4.22–5.81)
RDW: 14.1 % (ref 11.5–15.5)
WBC: 6 10*3/uL (ref 4.0–10.5)
nRBC: 0.7 % — ABNORMAL HIGH (ref 0.0–0.2)

## 2022-03-15 LAB — ECHOCARDIOGRAM COMPLETE
Area-P 1/2: 3.31 cm2
Calc EF: 42.6 %
Height: 68 in
Single Plane A2C EF: 46.6 %
Single Plane A4C EF: 42.3 %
Weight: 3365.1 oz

## 2022-03-15 MED ORDER — CARVEDILOL 6.25 MG PO TABS
6.2500 mg | ORAL_TABLET | Freq: Two times a day (BID) | ORAL | 0 refills | Status: DC
  Filled 2022-03-15: qty 60, 30d supply, fill #0

## 2022-03-15 MED ORDER — TORSEMIDE 10 MG PO TABS
10.0000 mg | ORAL_TABLET | Freq: Every day | ORAL | 0 refills | Status: DC
  Filled 2022-03-15: qty 30, 30d supply, fill #0

## 2022-03-15 MED ORDER — SODIUM CHLORIDE 0.9 % IV SOLN
2.0000 g | INTRAVENOUS | Status: AC
  Administered 2022-03-15: 2 g via INTRAVENOUS
  Filled 2022-03-15: qty 20

## 2022-03-15 MED ORDER — CEFDINIR 300 MG PO CAPS
300.0000 mg | ORAL_CAPSULE | Freq: Two times a day (BID) | ORAL | 0 refills | Status: DC
  Filled 2022-03-15: qty 2, 1d supply, fill #0

## 2022-03-15 MED ORDER — TORSEMIDE 20 MG PO TABS
10.0000 mg | ORAL_TABLET | Freq: Every day | ORAL | Status: DC
  Administered 2022-03-15: 10 mg via ORAL
  Filled 2022-03-15: qty 1

## 2022-03-15 MED ORDER — PREDNISONE 5 MG PO TABS
ORAL_TABLET | ORAL | 0 refills | Status: DC
  Filled 2022-03-15: qty 30, 4d supply, fill #0

## 2022-03-15 MED ORDER — AZITHROMYCIN 500 MG PO TABS
500.0000 mg | ORAL_TABLET | Freq: Every day | ORAL | 0 refills | Status: DC
  Filled 2022-03-15: qty 1, 1d supply, fill #0

## 2022-03-15 MED ORDER — IPRATROPIUM-ALBUTEROL 0.5-2.5 (3) MG/3ML IN SOLN
3.0000 mL | Freq: Four times a day (QID) | RESPIRATORY_TRACT | 0 refills | Status: DC
  Filled 2022-03-15: qty 90, 8d supply, fill #0

## 2022-03-15 MED ORDER — ROPINIROLE HCL 1 MG PO TABS
2.0000 mg | ORAL_TABLET | Freq: Every day | ORAL | 0 refills | Status: DC
  Filled 2022-03-15: qty 60, 30d supply, fill #0

## 2022-03-15 NOTE — Progress Notes (Signed)
Rounding Note    Patient Name: [name] Date of Encounter: 03/15/2022  [place] Cardiologist: Pixie Casino, MD   Subjective   Continue with cough; mild dyspnea; no CP  Inpatient Medications    Scheduled Meds:  apixaban  5 mg Oral BID   atorvastatin  80 mg Oral QPM   azithromycin  500 mg Oral Daily   benzonatate  100 mg Oral TID   carvedilol  6.25 mg Oral BID   cycloSPORINE  1 drop Both Eyes BID   folic acid  2 mg Oral Daily   guaiFENesin  600 mg Oral BID   insulin aspart  0-15 Units Subcutaneous TID WC   insulin aspart  0-5 Units Subcutaneous QHS   insulin glargine-yfgn  28 Units Subcutaneous QHS   insulin glargine-yfgn  36 Units Subcutaneous Daily   ipratropium-albuterol  3 mL Nebulization QID   levothyroxine  75 mcg Oral Daily   loratadine  10 mg Oral Daily   losartan  25 mg Oral Daily   polyethylene glycol  17 g Oral Daily   predniSONE  15 mg Oral Q breakfast   QUEtiapine  25 mg Oral QHS   rOPINIRole  0.5 mg Oral TID   sodium chloride flush  3 mL Intravenous Q12H   torsemide  20 mg Oral Daily   Continuous Infusions:  cefTRIAXone (ROCEPHIN)  IV 2 g (03/14/22 1329)   PRN Meds: acetaminophen **OR** acetaminophen, albuterol, dextrose, guaiFENesin-dextromethorphan, ondansetron **OR** ondansetron (ZOFRAN) IV, pantoprazole, polyvinyl alcohol, traMADol, triamcinolone cream   Vital Signs    Vitals:   03/14/22 1900 03/14/22 2015 03/15/22 0211 03/15/22 0500  BP: 104/74  115/71   Pulse: 81 76 81   Resp: '18 17 13   '$ Temp: 98.3 F (36.8 C)     TempSrc: Oral     SpO2: 94%  99%   Weight:    94 kg  Height:        Intake/Output Summary (Last 24 hours) at 03/15/2022 0807 Last data filed at 03/15/2022 0530 Gross per 24 hour  Intake 483 ml  Output 2350 ml  Net -1867 ml      03/15/2022    5:00 AM 03/14/2022    4:34 AM 03/11/2022   11:00 AM  Last 3 Weights  Weight (lbs) 207 lb 3.7 oz 210 lb 5.1 oz 230 lb  Weight (kg) 94 kg 95.4 kg 104.327 kg       Telemetry    Sinus - Personally Reviewed    GEN: No acute distress.   Neck: No JVD Cardiac: RRR, no murmurs, rubs, or gallops.  Respiratory: Clear to auscultation bilaterally. GI: Soft, nontender, non-distended  MS: No edema Neuro:  Nonfocal  Psych: Normal affect   Labs    High Sensitivity Troponin:   Recent Labs  Lab 03/11/22 1900  TROPONINIHS 35*     Chemistry Recent Labs  Lab 03/11/22 1048 03/12/22 0845 03/13/22 0308 03/14/22 0344 03/15/22 0519  NA 142   < > 139 140 138  K 4.5   < > 4.2 4.2 4.5  CL 110   < > 102 104 99  CO2 23   < > '27 25 26  '$ GLUCOSE 54*   < > 227* 267* 321*  BUN 23   < > 21 23 27*  CREATININE 1.32*   < > 1.26* 1.32* 1.45*  CALCIUM 9.5   < > 9.2 8.9 9.1  PROT 6.1*  --   --   --   --  ALBUMIN 3.7  --   --   --   --   AST 27  --   --   --   --   ALT 27  --   --   --   --   ALKPHOS 44  --   --   --   --   BILITOT 1.1  --   --   --   --   GFRNONAA 57*   < > >60 57* 51*  ANIONGAP 9   < > '10 11 13   '$ < > = values in this interval not displayed.   Hematology Recent Labs  Lab 03/13/22 0308 03/14/22 0344 03/15/22 0519  WBC 4.4 3.7* 6.0  RBC 2.68* 2.67* 2.78*  HGB 8.5* 8.4* 8.9*  HCT 25.8* 26.2* 26.5*  MCV 96.3 98.1 95.3  MCH 31.7 31.5 32.0  MCHC 32.9 32.1 33.6  RDW 14.3 14.5 14.1  PLT 153 176 171   Thyroid  Recent Labs  Lab 03/11/22 1048  TSH 0.869  FREET4 0.83    BNP Recent Labs  Lab 03/11/22 1900  BNP 102.8*    Radiology    ECHOCARDIOGRAM COMPLETE  Result Date: 03/15/2022    ECHOCARDIOGRAM REPORT   Patient Name:   [name] Date of Exam: 03/14/2022 Medical Rec #:  [id number]   Height:       68.0 in Accession #:    [id number]  Weight:       210.3 lb Date of Birth:  [birth year]    BSA:          2.088 m Patient Age:    71 years    BP:           114/65 mmHg Patient Gender: M           HR:           79 bpm. Exam Location:  Inpatient Procedure: 2D Echo, Cardiac Doppler and Color Doppler Indications:    CHF-Acute  Diastolic W29.56  History:        Patient has prior history of Echocardiogram examinations, most                 recent 12/20/2021. CHF, CAD and NSTEMI, Signs/Symptoms:Fatigue;                 Risk Factors:Diabetes, Hypertension and Dyslipidemia.  Sonographer:    Greer Pickerel Referring Phys: [place]  Sonographer Comments: Suboptimal parasternal window and suboptimal subcostal window. Image acquisition challenging due to patient body habitus and Image acquisition challenging due to respiratory motion. IMPRESSIONS  1. Left ventricular ejection fraction, by estimation, is 55 to 60%. The left ventricle has normal function. The left ventricle demonstrates regional wall motion abnormalities (see scoring diagram/findings for description). Left ventricular diastolic parameters are consistent with Grade I diastolic dysfunction (impaired relaxation).  2. Right ventricular systolic function is normal. The right ventricular size is normal.  3. The mitral valve is normal in structure. No evidence of mitral valve regurgitation. No evidence of mitral stenosis.  4. The aortic valve is tricuspid. There is mild calcification of the aortic valve. There is mild thickening of the aortic valve. Aortic valve regurgitation is not visualized. Aortic valve sclerosis is present, with no evidence of aortic valve stenosis.  5. Aortic dilatation noted. There is mild dilatation of the aortic root, measuring 41 mm.  6. The inferior vena cava is normal in size with greater than 50% respiratory variability, suggesting right atrial pressure of  3 mmHg. Comparison(s): Prior images unable to be directly viewed, comparison made by report only. The left ventricular function has improved. FINDINGS  Left Ventricle: Left ventricular ejection fraction, by estimation, is 55 to 60%. The left ventricle has normal function. The left ventricle demonstrates regional wall motion abnormalities. The left ventricular internal cavity size was normal in  size. There is no left ventricular hypertrophy. Left ventricular diastolic parameters are consistent with Grade I diastolic dysfunction (impaired relaxation).  LV Wall Scoring: The basal inferior segment is akinetic. Right Ventricle: The right ventricular size is normal. No increase in right ventricular wall thickness. Right ventricular systolic function is normal. Left Atrium: Left atrial size was normal in size. Right Atrium: Right atrial size was normal in size. Pericardium: There is no evidence of pericardial effusion. Mitral Valve: The mitral valve is normal in structure. No evidence of mitral valve regurgitation. No evidence of mitral valve stenosis. Tricuspid Valve: The tricuspid valve is normal in structure. Tricuspid valve regurgitation is not demonstrated. No evidence of tricuspid stenosis. Aortic Valve: The aortic valve is tricuspid. There is mild calcification of the aortic valve. There is mild thickening of the aortic valve. Aortic valve regurgitation is not visualized. Aortic valve sclerosis is present, with no evidence of aortic valve stenosis. Pulmonic Valve: The pulmonic valve was normal in structure. Pulmonic valve regurgitation is trivial. No evidence of pulmonic stenosis. Aorta: Aortic dilatation noted. There is mild dilatation of the aortic root, measuring 41 mm. Venous: The inferior vena cava is normal in size with greater than 50% respiratory variability, suggesting right atrial pressure of 3 mmHg. IAS/Shunts: No atrial level shunt detected by color flow Doppler.  LEFT VENTRICLE PLAX 2D LVOT diam:     2.00 cm     Diastology LV SV:         63          LV e' medial:    6.20 cm/s LV SV Index:   30          LV E/e' medial:  10.4 LVOT Area:     3.14 cm    LV e' lateral:   5.66 cm/s                            LV E/e' lateral: 11.4  LV Volumes (MOD) LV vol d, MOD A2C: 89.0 ml LV vol d, MOD A4C: 82.5 ml LV vol s, MOD A2C: 47.5 ml LV vol s, MOD A4C: 47.6 ml LV SV MOD A2C:     41.5 ml LV SV MOD A4C:      82.5 ml LV SV MOD BP:      37.2 ml RIGHT VENTRICLE RV S prime:     9.90 cm/s TAPSE (M-mode): 2.1 cm LEFT ATRIUM             Index        RIGHT ATRIUM          Index LA Vol (A2C):   52.5 ml 25.14 ml/m  RA Area:     7.88 cm LA Vol (A4C):   56.9 ml 27.25 ml/m  RA Volume:   15.30 ml 7.33 ml/m LA Biplane Vol: 60.7 ml 29.07 ml/m  AORTIC VALVE             PULMONIC VALVE LVOT Vmax:   76.60 cm/s  PR End Diast Vel: 3.70 msec LVOT Vmean:  52.900 cm/s LVOT VTI:    0.201 m  AORTA Ao  Root diam: 4.10 cm Ao Asc diam:  3.90 cm MITRAL VALVE MV Area (PHT): 3.31 cm    SHUNTS MV Decel Time: 229 msec    Systemic VTI:  0.20 m MV E velocity: 64.50 cm/s  Systemic Diam: 2.00 cm Candee Furbish MD Electronically signed by Candee Furbish MD Signature Date/Time: 03/15/2022/6:45:29 AM    Final      Patient Profile     71 y.o. male with past medical history of hypertension, hyperlipidemia, diabetes mellitus, chronic diastolic congestive heart failure, autoimmune myopathy/myositis, prior PE/DVT, chronic stage IIIa kidney disease admitted with RSV/pneumonia being evaluated for possible acute on chronic diastolic congestive heart failure.  Echocardiogram this admission shows normal LV function and grade 1 diastolic dysfunction; mildly dilated aortic root.  Assessment & Plan    1 acute on chronic diastolic congestive heart failure-patient previously had a cardiomyopathy which improved on most recent echocardiogram.  Follow-up echocardiogram this admission shows continued normal LV function.  Patient has mild ankle edema but otherwise appears euvolemic.  Blood pressure has been borderline.  Discontinue losartan.  Decrease Demadex to 10 mg daily.  2 history of cardiomyopathy-LV function remains normal.  Blood pressure has been low.  We will discontinue losartan and continue carvedilol for now.  Carvedilol can be discontinued in the future if necessary.  3 hypertension-plan as outlined above.  4 upper respiratory infection-antibiotics per  primary care.  5 hyperlipidemia-continue statin.  Patient can be discharged from a cardiac standpoint.  Would check potassium and renal function 1 week following discharge.  Patient can follow-up in our office 8 to 12 weeks following discharge with Dr. Debara Pickett.  Continue present medications at discharge.  Cardiology will sign off.  Please call with questions.  For questions or updates, please contact [place] Please consult [email or web address] for contact info under        Signed, Kirk Ruths, MD  03/15/2022, 8:07 AM

## 2022-03-15 NOTE — Discharge Summary (Signed)
Physician Discharge Summary  [name] DEY:[id number] DOB: 12-18-1950 DOA: 03/11/2022  PCP: [name], DO  Admit date: 03/11/2022 Discharge date: 03/15/2022  Admitted From: (Home) Disposition:  Home , Patient did not want [facility] at Time of discharge  Recommendations for Outpatient Follow-up:  Follow up with PCP in 1-2 weeks Please obtain BMP/CBC in one week Was encouraged to use incentive spirometry and flutter valve at time of discharge   Discharge Condition: (Stable) CODE STATUS: (FULL) Diet recommendation: Heart Healthy / Carb Modified   Brief/Interim Summary: [name] is a 71 y.o. male with PMH significant for DM2, HTN, diastolic CHF, autoimmune myopathy/myositis, hypothyroidism, PE/DVT, and CKD stage III . 11/12, patient was brought to the ED from home for altered mental status.   In the ED, patient was afebrile, heart rate 90s, blood pressure 100s Labs with WC count normal, hemoglobin low at 9.7, creatinine elevated to 1.32, ammonia slightly elevated to 48, CK total elevated 1500, troponin elevated 35. CT head did not show any acute intracranial findings. Chest x-ray showed mild diffuse interstitial prominence and peribronchial cuffing, most severe at the left lung base.  Findings concerning for bronchitis and probable developing left basilar bronchopneumonia. RVP positive for RSV. Patient was started on IV Rocephin, azithromycin, was admitted to the hospital for further management.  RSV pneumonia Left basilar bronchopneumonia After his son returned home from school, patient started to have cough, headache and congestion. RVP positive for RSV Chest x-ray as above suggestive of left basilar bronchopneumonia. WBC count, lactic acid normal.  Procalcitonin slightly elevated. Patient was treated with IV Rocephin and azithromycin, he will be discharged on 1 day on oral cefdinir and azithromycin to finish total of 5 days treatment He was encouraged to use incentive spirometry  and flutter valve   Last Labs         Recent Labs  Lab 03/11/22 1048 03/11/22 1900 03/12/22 0845 03/13/22 0308 03/14/22 0344  WBC 5.2  --  4.4 4.4 3.7*  LATICACIDVEN 1.7  --   --   --   --   PROCALCITON  --  0.29  --   --   --       Recent sinusitis Per history, few days prior patient had had a sinus infection which was treated with antibiotics and had resolved.       Acute exacerbation of diastolic CHF Essential hypertension Wife reported grossly worsening lip swelling and 8 to 10 pound weight gain recently.   Last EF noted to be 55-60% with grade 1 diastolic dysfunction by echocardiogram 11/2021. PTA on Coreg 12.5 mg twice daily, Entresto 24-26 mg half tablet twice daily, spironolactone 12.5 mg daily, and furosemide 20 mg daily as needed for fluid retention.   -Cardiology were consulted, patient was on IV diuresis, but blood pressure was soft, so medication has been adjusted where he will be discharged on torsemide, Entresto has been discontinued, and his Coreg dose has been decreased in half, he is to follow up with cardiology as an outpatient.   Last Labs         Recent Labs  Lab 03/11/22 1048 03/11/22 1900 03/12/22 0845 03/13/22 0308 03/14/22 0344  BNP  --  102.8*  --   --   --   BUN 23  --  '23 21 23  '$ CREATININE 1.32*  --  1.28* 1.26* 1.32*  K 4.5  --  4.7 4.2 4.2      Type 2 diabetes mellitus with hypoglycemia A1c 9 on 03/11/2022 Resume  home regimen on discharge   Chronic kidney disease stage IIIa Creatinine 1.32 which appears around patient's baseline. Continue to monitor kidney function with diuresis Recent Labs (within last 365 days)              Recent Labs    07/27/21 0310 08/09/21 0000 09/05/21 1037 10/09/21 1123 01/09/22 1304 01/30/22 0000 03/11/22 1048 03/12/22 0845 03/13/22 0308 03/14/22 0344  BUN '14 24 24 20 26 13 23 23 21 23  '$ CREATININE 1.39* 1.18 1.14 1.11 1.35* 1.20 1.32* 1.28* 1.26* 1.32*      Acute metabolic  encephalopathy Presented with altered mental status  CT head did not show any acute intracranial abnormality.  Altered mentation likely due to pneumonia.  See management pneumonia below.  Resolved, mentation back to baseline.   CAD H/o NSTEMI HLD Coronary CT in May noted no signs of significant stenosis in the proximal/proximal LAD. Continue Eliquis and statin.   Autoimmune myopathy  rheumatoid arthritis Patient is on methotrexate and chronic prednisone.  He is followed by rheumatology in the outpatient setting. CK level was initially elevated to 1500.  Down to 606 today. Continue prednisone and resume methotrexate in outpatient setting Continue tramadol as needed for pain   Steroid-dependent -Patient been chronically on significant dose steroids due to his autoimmune myopathy/rheumatoid arthritis.  After receiving 1 dose of IV Solu-Medrol yesterday, so he will be discharged on increased dose of prednisone with rapid taper over the next 4 days to go back to home dose of 15 mg of prednisone daily  Last Labs      Recent Labs  Lab 03/11/22 1900 03/13/22 0308  CKTOTAL 1,504* 606*      Elevated ammonia level Unclear etiology.  No finding of liver cirrhosis and the most recent imaging from March 2023 Not on Depakote.  Repeat ammonia level showed an improvement to 29. Last Labs      Recent Labs  Lab 03/11/22 1048 03/12/22 0845  AMMONIA 48* 29      History of DVT/PE on chronic anticoagulation In March 2023, patient had saddle pulm embolism, right heart strain and right lower EXTR DVT for which she underwent thrombectomy by IR.  VTE was thought possibly provoked due to patient being on testosterone injections and impaired mobility due to myositis. Chronically anticoagulated with Eliquis.  Continue the same.   Macrocytic anemia Hemoglobin at baseline close to 9.  No active bleeding. Recent Labs (within last 365 days)         Recent Labs    01/30/22 0000 03/11/22 1048  03/12/22 0845 03/13/22 0308 03/14/22 0344  HGB 10.0* 9.7* 8.6* 8.5* 8.4*  MCV 97.1 100.3* 97.7 96.3 98.1  VITAMINB12 207  --   --   --   --   FOLATE >24.0  --   --   --   --   FERRITIN 316  --   --   --   --   TIBC 230*  --   --   --   --   IRON 108  --   --   --   --       Hypothyroidism TSH 0.869 Continue levothyroxine    Morbid obesity  -Body mass index is 31.98 kg/m. Patient has been advised to make an attempt to improve diet and exercise patterns to aid in weight loss.   Impaired mobility Limited mobility because of bilateral lower extremity edema as well as myositis.       Discharge Diagnoses:  Principal  Problem:   Acute metabolic encephalopathy Active Problems:   RSV (respiratory syncytial virus pneumonia)   Sinusitis, acute frontal   DM2 (diabetes mellitus, type 2) (HCC)   Diastolic congestive heart failure (HCC)   Macrocytic anemia   CKD (chronic kidney disease), stage III (HCC)   Rheumatoid arthritis (HCC)   Autoimmune myopathy   Benign essential HTN   History of DVT (deep vein thrombosis)   History of pulmonary embolism   CAD (coronary artery disease)   Hypothyroidism   Hyperlipidemia   Obesity (BMI 30-39.9)    Discharge Instructions  Discharge Instructions     Diet - low sodium heart healthy   Complete by: As directed    Discharge instructions   Complete by: As directed    Follow with Primary MD [name], DO in 7 days   Get CBC, CMP, 2 view Chest X ray checked  by Primary MD next visit.    Activity: As tolerated with Full fall precautions use walker/cane & assistance as needed   Disposition Home    Diet: Heart Healthy/Carb modiifed  For Heart failure patients - Check your Weight same time everyday, if you gain over 2 pounds, or you develop in leg swelling, experience more shortness of breath or chest pain, call your Primary MD immediately. Follow Cardiac Low Salt Diet and 1.5 lit/day fluid restriction.   On your next visit  with your primary care physician please Get Medicines reviewed and adjusted.   Please request your Prim.MD to go over all Hospital Tests and Procedure/Radiological results at the follow up, please get all Hospital records sent to your Prim MD by signing hospital release before you go home.   If you experience worsening of your admission symptoms, develop shortness of breath, life threatening emergency, suicidal or homicidal thoughts you must seek medical attention immediately by calling 911 or calling your MD immediately  if symptoms less severe.  You Must read complete instructions/literature along with all the possible adverse reactions/side effects for all the Medicines you take and that have been prescribed to you. Take any new Medicines after you have completely understood and accpet all the possible adverse reactions/side effects.   Do not drive, operating heavy machinery, perform activities at heights, swimming or participation in water activities or provide baby sitting services if your were admitted for syncope or siezures until you have seen by Primary MD or a Neurologist and advised to do so again.  Do not drive when taking Pain medications.    Do not take more than prescribed Pain, Sleep and Anxiety Medications  Special Instructions: If you have smoked or chewed Tobacco  in the last 2 yrs please stop smoking, stop any regular Alcohol  and or any Recreational drug use.  Wear Seat belts while driving.   Please note  You were cared for by a hospitalist during your hospital stay. If you have any questions about your discharge medications or the care you received while you were in the hospital after you are discharged, you can call the unit and asked to speak with the hospitalist on call if the hospitalist that took care of you is not available. Once you are discharged, your primary care physician will handle any further medical issues. Please note that NO REFILLS for any discharge  medications will be authorized once you are discharged, as it is imperative that you return to your primary care physician (or establish a relationship with a primary care physician if you do not have one) for  your aftercare needs so that they can reassess your need for medications and monitor your lab values.   Increase activity slowly   Complete by: As directed       Allergies as of 03/15/2022       Reactions   Benazepril Cough   Gabapentin Swelling   Hydrocodone    Shellfish Allergy         Medication List     STOP taking these medications    Entresto 24-26 MG Generic drug: sacubitril-valsartan   furosemide 20 MG tablet Commonly known as: LASIX       TAKE these medications    acarbose 100 MG tablet Commonly known as: PRECOSE Take 100 mg by mouth 3 (three) times daily with meals.   acetaminophen 500 MG tablet Commonly known as: TYLENOL See admin instructions. Take (1,000 mg) by mouth in the morning and (500 mg) by mouth at bedtime   apixaban 5 MG Tabs tablet Commonly known as: ELIQUIS Take 1 tablet (5 mg total) by mouth 2 (two) times daily.   atorvastatin 80 MG tablet Commonly known as: LIPITOR Take 1 tablet (80 mg total) by mouth daily. Please attend scheduled appointment for additional refills. What changed: when to take this   azithromycin 500 MG tablet Commonly known as: ZITHROMAX Take 1 tablet (500 mg total) by mouth daily. Start taking on: March 16, 2022   baclofen 20 MG tablet Commonly known as: LIORESAL TAKE 1 TABLET BY MOUTH TWICE A DAY   calcium carbonate 600 MG Tabs tablet Commonly known as: OS-CAL Take 600 mg by mouth daily.   carvedilol 6.25 MG tablet Commonly known as: COREG Take 1 tablet (6.25 mg total) by mouth 2 (two) times daily. What changed:  medication strength how much to take   cefdinir 300 MG capsule Commonly known as: OMNICEF Take 1 capsule (300 mg total) by mouth 2 (two) times daily. Start taking on: March 16, 6733   folic acid 1 MG tablet Commonly known as: FOLVITE Take 2 mg by mouth daily.   FreeStyle Libre 2 Sensor Misc Use to check glucose as needed.  Change every 14 days.   glipiZIDE 10 MG 24 hr tablet Commonly known as: GLUCOTROL XL Take 20 mg by mouth daily.   insulin glargine 100 UNIT/ML injection Commonly known as: LANTUS Inject 0.46-0.56 mLs (46-56 Units total) into the skin at bedtime. For now take 30 units twice a day and then increase to your usual dose as your oral intake improves. Increase both doses by 2units every day. Check your blood sugars at home three times daily. Call your doctor if sugar level is more than 300 or less than 80. What changed:  how much to take when to take this additional instructions   ipratropium-albuterol 0.5-2.5 (3) MG/3ML Soln Commonly known as: DUONEB Use 1 vial (38m) by nebulization 4 (four) times daily.   Levothyroxine Sodium 75 MCG Caps Take 75 mcg by mouth daily.   loratadine 10 MG tablet Commonly known as: CLARITIN Take 10 mg by mouth daily.   metFORMIN 500 MG 24 hr tablet Commonly known as: GLUCOPHAGE-XR Take by mouth See admin instructions. Take (1,000 mg) by mouth in the morning and (500 mg) by mouth at night.   methotrexate 5 MG tablet Commonly known as: RHEUMATREX Take 2 tablets (10 mg total) by mouth once a week. Caution: Chemotherapy. Protect from light. Take on fridays per patient.  OKAY TO RESUME FROM 10/08/14. What changed:  how much to take additional  instructions   nystatin powder Commonly known as: MYCOSTATIN/NYSTOP Apply 1 Application topically 3 (three) times daily.   OneTouch Verio test strip Generic drug: glucose blood SMARTSIG:Via Meter   pantoprazole 40 MG tablet Commonly known as: PROTONIX Take 1 tablet (40 mg total) by mouth as needed.   polyvinyl alcohol 1.4 % ophthalmic solution Commonly known as: LIQUIFILM TEARS 1 drop as needed for dry eyes.   predniSONE 5 MG tablet Commonly known as:  DELTASONE Please take 9 tablets ('45mg'$ ) by mouth once daily for 2 days, then 6 tablets ('30mg'$ ) once daily for 2 days, then back to 15 mg oral daily. What changed:  how much to take how to take this when to take this additional instructions   [place] -- Unknown Name -- Unknown Dosing   QUEtiapine 25 MG tablet Commonly known as: SEROQUEL Take 1 tablet (25 mg total) by mouth at bedtime.   QUILLICHEW ER PO Take by mouth. Dosing Unknown   rOPINIRole 1 MG tablet Commonly known as: REQUIP Take 2 tablets (2 mg total) by mouth at bedtime.   spironolactone 25 MG tablet Commonly known as: ALDACTONE Take 0.5 tablets (12.5 mg total) by mouth daily.   torsemide 10 MG tablet Commonly known as: DEMADEX Take 1 tablet (10 mg total) by mouth daily. Start taking on: March 16, 2022   traMADol 50 MG tablet Commonly known as: ULTRAM See admin instructions. Take one tablet (50 mg) by mouth in the morning and half tablet (25 mg) by mouth at night.   triamcinolone cream 0.1 % Commonly known as: KENALOG Use 1 application twice daily as needed to red itchy areas below the face   vitamin B-12 100 MCG tablet Commonly known as: CYANOCOBALAMIN Take 100 mcg by mouth daily.               Durable Medical Equipment  (From admission, onward)           Start     Ordered   03/15/22 0934  For home use only DME Nebulizer machine  Once       Question Answer Comment  Patient needs a nebulizer to treat with the following condition Pneumonia   Length of Need 6 Months      03/15/22 0933   03/15/22 0906  For home use only DME Other see comment  Once       Comments: Needs shower chair  Question:  Length of Need  Answer:  Lifetime   03/15/22 0906            Allergies  Allergen Reactions   Benazepril Cough   Gabapentin Swelling   Hydrocodone    Shellfish Allergy     Consultations: cardiology   Procedures/Studies: ECHOCARDIOGRAM  COMPLETE  Result Date: 03/15/2022    ECHOCARDIOGRAM REPORT   Patient Name:   YUSUF YU Date of Exam: 03/14/2022 Medical Rec #:  [id number]   Height:       68.0 in Accession #:    [id number]  Weight:       210.3 lb Date of Birth:  09/04/1950    BSA:          2.088 m Patient Age:    23 years    BP:           114/65 mmHg Patient Gender: M           HR:           79 bpm. Exam Location:  Inpatient Procedure:  2D Echo, Cardiac Doppler and Color Doppler Indications:    CHF-Acute Diastolic R91.63  History:        Patient has prior history of Echocardiogram examinations, most                 recent 12/20/2021. CHF, CAD and NSTEMI, Signs/Symptoms:Fatigue;                 Risk Factors:Diabetes, Hypertension and Dyslipidemia.  Sonographer:    Greer Pickerel Referring Phys: [place] Neighbors  Sonographer Comments: Suboptimal parasternal window and suboptimal subcostal window. Image acquisition challenging due to patient body habitus and Image acquisition challenging due to respiratory motion. IMPRESSIONS  1. Left ventricular ejection fraction, by estimation, is 55 to 60%. The left ventricle has normal function. The left ventricle demonstrates regional wall motion abnormalities (see scoring diagram/findings for description). Left ventricular diastolic parameters are consistent with Grade I diastolic dysfunction (impaired relaxation).  2. Right ventricular systolic function is normal. The right ventricular size is normal.  3. The mitral valve is normal in structure. No evidence of mitral valve regurgitation. No evidence of mitral stenosis.  4. The aortic valve is tricuspid. There is mild calcification of the aortic valve. There is mild thickening of the aortic valve. Aortic valve regurgitation is not visualized. Aortic valve sclerosis is present, with no evidence of aortic valve stenosis.  5. Aortic dilatation noted. There is mild dilatation of the aortic root, measuring 41 mm.  6. The inferior vena cava is normal in size with  greater than 50% respiratory variability, suggesting right atrial pressure of 3 mmHg. Comparison(s): Prior images unable to be directly viewed, comparison made by report only. The left ventricular function has improved. FINDINGS  Left Ventricle: Left ventricular ejection fraction, by estimation, is 55 to 60%. The left ventricle has normal function. The left ventricle demonstrates regional wall motion abnormalities. The left ventricular internal cavity size was normal in size. There is no left ventricular hypertrophy. Left ventricular diastolic parameters are consistent with Grade I diastolic dysfunction (impaired relaxation).  LV Wall Scoring: The basal inferior segment is akinetic. Right Ventricle: The right ventricular size is normal. No increase in right ventricular wall thickness. Right ventricular systolic function is normal. Left Atrium: Left atrial size was normal in size. Right Atrium: Right atrial size was normal in size. Pericardium: There is no evidence of pericardial effusion. Mitral Valve: The mitral valve is normal in structure. No evidence of mitral valve regurgitation. No evidence of mitral valve stenosis. Tricuspid Valve: The tricuspid valve is normal in structure. Tricuspid valve regurgitation is not demonstrated. No evidence of tricuspid stenosis. Aortic Valve: The aortic valve is tricuspid. There is mild calcification of the aortic valve. There is mild thickening of the aortic valve. Aortic valve regurgitation is not visualized. Aortic valve sclerosis is present, with no evidence of aortic valve stenosis. Pulmonic Valve: The pulmonic valve was normal in structure. Pulmonic valve regurgitation is trivial. No evidence of pulmonic stenosis. Aorta: Aortic dilatation noted. There is mild dilatation of the aortic root, measuring 41 mm. Venous: The inferior vena cava is normal in size with greater than 50% respiratory variability, suggesting right atrial pressure of 3 mmHg. IAS/Shunts: No atrial level  shunt detected by color flow Doppler.  LEFT VENTRICLE PLAX 2D LVOT diam:     2.00 cm     Diastology LV SV:         63          LV e' medial:  6.20 cm/s LV SV Index:   30          LV E/e' medial:  10.4 LVOT Area:     3.14 cm    LV e' lateral:   5.66 cm/s                            LV E/e' lateral: 11.4  LV Volumes (MOD) LV vol d, MOD A2C: 89.0 ml LV vol d, MOD A4C: 82.5 ml LV vol s, MOD A2C: 47.5 ml LV vol s, MOD A4C: 47.6 ml LV SV MOD A2C:     41.5 ml LV SV MOD A4C:     82.5 ml LV SV MOD BP:      37.2 ml RIGHT VENTRICLE RV S prime:     9.90 cm/s TAPSE (M-mode): 2.1 cm LEFT ATRIUM             Index        RIGHT ATRIUM          Index LA Vol (A2C):   52.5 ml 25.14 ml/m  RA Area:     7.88 cm LA Vol (A4C):   56.9 ml 27.25 ml/m  RA Volume:   15.30 ml 7.33 ml/m LA Biplane Vol: 60.7 ml 29.07 ml/m  AORTIC VALVE             PULMONIC VALVE LVOT Vmax:   76.60 cm/s  PR End Diast Vel: 3.70 msec LVOT Vmean:  52.900 cm/s LVOT VTI:    0.201 m  AORTA Ao Root diam: 4.10 cm Ao Asc diam:  3.90 cm MITRAL VALVE MV Area (PHT): 3.31 cm    SHUNTS MV Decel Time: 229 msec    Systemic VTI:  0.20 m MV E velocity: 64.50 cm/s  Systemic Diam: 2.00 cm Candee Furbish MD Electronically signed by Candee Furbish MD Signature Date/Time: 03/15/2022/6:45:29 AM    Final    CT HEAD WO CONTRAST  Result Date: 03/11/2022 CLINICAL DATA:  Altered mental status. EXAM: CT HEAD WITHOUT CONTRAST TECHNIQUE: Contiguous axial images were obtained from the base of the skull through the vertex without intravenous contrast. RADIATION DOSE REDUCTION: This exam was performed according to the departmental dose-optimization program which includes automated exposure control, adjustment of the mA and/or kV according to patient size and/or use of iterative reconstruction technique. COMPARISON:  07/25/2021 and MRI from 08/13/2021 FINDINGS: Brain: The brainstem, cerebellum, cerebral peduncles, thalami, basal ganglia, basilar cisterns, and ventricular system appear within  normal limits. No intracranial hemorrhage, mass lesion, or acute CVA. Periventricular white matter and corona radiata hypodensities favor chronic ischemic microvascular white matter disease. Vascular: There is atherosclerotic calcification of the cavernous carotid arteries bilaterally. Skull: Unremarkable Sinuses/Orbits: Chronic bilateral maxillary, ethmoid, and frontal sinusitis. There is potentially a small air-fluid level in the right frontal sinus such that superimposed mild acute right frontal sinusitis is not excluded. Other: No supplemental non-categorized findings. IMPRESSION: 1. No acute intracranial findings. 2. Periventricular white matter and corona radiata hypodensities favor chronic ischemic microvascular white matter disease. 3. Chronic bilateral maxillary, ethmoid, and frontal sinusitis. There is potentially a small air-fluid level in the right frontal sinus such that superimposed mild acute right frontal sinusitis is not excluded. 4. Atherosclerosis. Electronically Signed   By: Van Clines M.D.   On: 03/11/2022 12:14   DG Chest Port 1 View  Result Date: 03/11/2022 CLINICAL DATA:  71 year old male with history of altered mental status. EXAM: PORTABLE CHEST 1 VIEW COMPARISON:  Chest x-ray 07/24/2021. FINDINGS: Lung volumes are low. Diffuse interstitial prominence and peribronchial cuffing, most evident throughout the left lung, particularly in the left lung base. Right lung appears relatively clear. No pleural effusions. No pneumothorax. No evidence of pulmonary edema. Heart size is borderline enlarged. Upper mediastinal contours are within normal limits. IMPRESSION: 1. Mild diffuse interstitial prominence and peribronchial cuffing, most severe in the left lung, particularly at the left lung base. Findings are concerning for bronchitis and probable developing left basilar bronchopneumonia. Electronically Signed   By: [name] M.D.   On: 03/11/2022 11:26   SLEEP STUDY  DOCUMENTS  Result Date: 02/22/2022 Ordered by an unspecified provider.     Subjective: Fever, no chills, dyspnea has improved, no chest pain, still reports some cough.  Discharge Exam: Vitals:   03/15/22 0820 03/15/22 0926  BP:  127/68  Pulse:  70  Resp:    Temp:    SpO2: 94%    Vitals:   03/15/22 0500 03/15/22 0807 03/15/22 0820 03/15/22 0926  BP:  127/68  127/68  Pulse:  71  70  Resp:  18    Temp:  97.6 F (36.4 C)    TempSrc:  Oral    SpO2:  100% 94%   Weight: 94 kg     Height:        General: Pt is alert, awake, not in acute distress Cardiovascular: RRR, S1/S2 +, no rubs, no gallops Respiratory: CTA bilaterally, no wheezing, no rhonchi Abdominal: Soft, NT, ND, bowel sounds + Extremities:trace  edema, no cyanosis    The results of significant diagnostics from this hospitalization (including imaging, microbiology, ancillary and laboratory) are listed below for reference.     Microbiology: Recent Results (from the past 240 hour(s))  Respiratory (~20 pathogens) panel by PCR     Status: Abnormal   Collection Time: 03/11/22 10:48 AM   Specimen: Nasopharyngeal Swab; Respiratory  Result Value Ref Range Status   Adenovirus NOT DETECTED NOT DETECTED Final   Coronavirus 229E NOT DETECTED NOT DETECTED Final    Comment: (NOTE) The Coronavirus on the Respiratory Panel, DOES NOT test for the novel  Coronavirus (2019 nCoV)    Coronavirus HKU1 NOT DETECTED NOT DETECTED Final   Coronavirus NL63 NOT DETECTED NOT DETECTED Final   Coronavirus OC43 NOT DETECTED NOT DETECTED Final   Metapneumovirus NOT DETECTED NOT DETECTED Final   Rhinovirus / Enterovirus NOT DETECTED NOT DETECTED Final   Influenza A NOT DETECTED NOT DETECTED Final   Influenza B NOT DETECTED NOT DETECTED Final   Parainfluenza Virus 1 NOT DETECTED NOT DETECTED Final   Parainfluenza Virus 2 NOT DETECTED NOT DETECTED Final   Parainfluenza Virus 3 NOT DETECTED NOT DETECTED Final   Parainfluenza Virus 4 NOT  DETECTED NOT DETECTED Final   Respiratory Syncytial Virus DETECTED (A) NOT DETECTED Final   Bordetella pertussis NOT DETECTED NOT DETECTED Final   Bordetella Parapertussis NOT DETECTED NOT DETECTED Final   Chlamydophila pneumoniae NOT DETECTED NOT DETECTED Final   Mycoplasma pneumoniae NOT DETECTED NOT DETECTED Final    Comment: Performed at [facility], [address], [place], [place] [address]  Blood culture (routine x 2)     Status: None (Preliminary result)   Collection Time: 03/11/22 10:48 AM   Specimen: BLOOD  Result Value Ref Range Status   Specimen Description BLOOD SITE NOT SPECIFIED  Final   Special Requests   Final    BOTTLES DRAWN AEROBIC AND ANAEROBIC Blood Culture results may not be  optimal due to an inadequate volume of blood received in culture bottles   Culture   Final    NO GROWTH 4 DAYS Performed at [facility], [place] [address] [place], [place] [address]    Report Status PENDING  Incomplete  Blood culture (routine x 2)     Status: None (Preliminary result)   Collection Time: 03/11/22 11:06 AM   Specimen: BLOOD RIGHT HAND  Result Value Ref Range Status   Specimen Description BLOOD RIGHT HAND  Final   Special Requests   Final    BOTTLES DRAWN AEROBIC ONLY Blood Culture results may not be optimal due to an inadequate volume of blood received in culture bottles   Culture   Final    NO GROWTH 4 DAYS Performed at [facility], [place] [address] [place], [place] [address]    Report Status PENDING  Incomplete  Urine Culture     Status: None   Collection Time: 03/11/22  1:43 PM   Specimen: In/Out Cath Urine  Result Value Ref Range Status   Specimen Description IN/OUT CATH URINE  Final   Special Requests NONE  Final   Culture   Final    NO GROWTH Performed at [facility], [place] [address] [place], [place] [address]    Report Status 03/12/2022 FINAL  Final     Labs: BNP (last 3 results) Recent Labs    07/26/21 0344 10/09/21 1123  03/11/22 1900  BNP 51.2 32.7 256.3*   Basic Metabolic Panel: Recent Labs  Lab 03/11/22 1048 03/12/22 0845 03/13/22 0308 03/14/22 0344 03/15/22 0519  NA 142 141 139 140 138  K 4.5 4.7 4.2 4.2 4.5  CL 110 105 102 104 99  CO2 '23 24 27 25 26  '$ GLUCOSE 54* 246* 227* 267* 321*  BUN '23 23 21 23 '$ 27*  CREATININE 1.32* 1.28* 1.26* 1.32* 1.45*  CALCIUM 9.5 9.2 9.2 8.9 9.1   Liver Function Tests: Recent Labs  Lab 03/11/22 1048  AST 27  ALT 27  ALKPHOS 44  BILITOT 1.1  PROT 6.1*  ALBUMIN 3.7   No results for input(s): "LIPASE", "AMYLASE" in the last 168 hours. Recent Labs  Lab 03/11/22 1048 03/12/22 0845  AMMONIA 48* 29   CBC: Recent Labs  Lab 03/11/22 1048 03/12/22 0845 03/13/22 0308 03/14/22 0344 03/15/22 0519  WBC 5.2 4.4 4.4 3.7* 6.0  NEUTROABS 3.3  --  3.5 2.7 5.0  HGB 9.7* 8.6* 8.5* 8.4* 8.9*  HCT 30.6* 25.9* 25.8* 26.2* 26.5*  MCV 100.3* 97.7 96.3 98.1 95.3  PLT 168 146* 153 176 171   Cardiac Enzymes: Recent Labs  Lab 03/11/22 1900 03/13/22 0308  CKTOTAL 1,504* 606*   BNP: Invalid input(s): "POCBNP" CBG: Recent Labs  Lab 03/14/22 1334 03/14/22 1707 03/14/22 2028 03/14/22 2341 03/15/22 0809  GLUCAP 241* 341* 373* 401* 291*   D-Dimer No results for input(s): "DDIMER" in the last 72 hours. Hgb A1c No results for input(s): "HGBA1C" in the last 72 hours. Lipid Profile No results for input(s): "CHOL", "HDL", "LDLCALC", "TRIG", "CHOLHDL", "LDLDIRECT" in the last 72 hours. Thyroid function studies No results for input(s): "TSH", "T4TOTAL", "T3FREE", "THYROIDAB" in the last 72 hours.  Invalid input(s): "FREET3" Anemia work up No results for input(s): "VITAMINB12", "FOLATE", "FERRITIN", "TIBC", "IRON", "RETICCTPCT" in the last 72 hours. Urinalysis    Component Value Date/Time   COLORURINE YELLOW 03/11/2022 1107   APPEARANCEUR CLEAR 03/11/2022 1107   LABSPEC 1.011 03/11/2022 1107   PHURINE  5.0 03/11/2022 1107   GLUCOSEU >=500 (A) 03/11/2022  1107   HGBUR MODERATE (A) 03/11/2022 1107   BILIRUBINUR NEGATIVE 03/11/2022 1107   KETONESUR NEGATIVE 03/11/2022 1107   PROTEINUR NEGATIVE 03/11/2022 1107   UROBILINOGEN 0.2 09/28/2014 1555   NITRITE NEGATIVE 03/11/2022 1107   LEUKOCYTESUR NEGATIVE 03/11/2022 1107   Sepsis Labs Recent Labs  Lab 03/12/22 0845 03/13/22 0308 03/14/22 0344 03/15/22 0519  WBC 4.4 4.4 3.7* 6.0   Microbiology Recent Results (from the past 240 hour(s))  Respiratory (~20 pathogens) panel by PCR     Status: Abnormal   Collection Time: 03/11/22 10:48 AM   Specimen: Nasopharyngeal Swab; Respiratory  Result Value Ref Range Status   Adenovirus NOT DETECTED NOT DETECTED Final   Coronavirus 229E NOT DETECTED NOT DETECTED Final    Comment: (NOTE) The Coronavirus on the Respiratory Panel, DOES NOT test for the novel  Coronavirus (2019 nCoV)    Coronavirus HKU1 NOT DETECTED NOT DETECTED Final   Coronavirus NL63 NOT DETECTED NOT DETECTED Final   Coronavirus OC43 NOT DETECTED NOT DETECTED Final   Metapneumovirus NOT DETECTED NOT DETECTED Final   Rhinovirus / Enterovirus NOT DETECTED NOT DETECTED Final   Influenza A NOT DETECTED NOT DETECTED Final   Influenza B NOT DETECTED NOT DETECTED Final   Parainfluenza Virus 1 NOT DETECTED NOT DETECTED Final   Parainfluenza Virus 2 NOT DETECTED NOT DETECTED Final   Parainfluenza Virus 3 NOT DETECTED NOT DETECTED Final   Parainfluenza Virus 4 NOT DETECTED NOT DETECTED Final   Respiratory Syncytial Virus DETECTED (A) NOT DETECTED Final   Bordetella pertussis NOT DETECTED NOT DETECTED Final   Bordetella Parapertussis NOT DETECTED NOT DETECTED Final   Chlamydophila pneumoniae NOT DETECTED NOT DETECTED Final   Mycoplasma pneumoniae NOT DETECTED NOT DETECTED Final    Comment: Performed at [facility], [place]. [address], [place], [place] [address]  Blood culture (routine x 2)     Status: None (Preliminary result)   Collection Time: 03/11/22 10:48 AM   Specimen: BLOOD   Result Value Ref Range Status   Specimen Description BLOOD SITE NOT SPECIFIED  Final   Special Requests   Final    BOTTLES DRAWN AEROBIC AND ANAEROBIC Blood Culture results may not be optimal due to an inadequate volume of blood received in culture bottles   Culture   Final    NO GROWTH 4 DAYS Performed at [facility], [place] [address], [place], [place] [address]    Report Status PENDING  Incomplete  Blood culture (routine x 2)     Status: None (Preliminary result)   Collection Time: 03/11/22 11:06 AM   Specimen: BLOOD RIGHT HAND  Result Value Ref Range Status   Specimen Description BLOOD RIGHT HAND  Final   Special Requests   Final    BOTTLES DRAWN AEROBIC ONLY Blood Culture results may not be optimal due to an inadequate volume of blood received in culture bottles   Culture   Final    NO GROWTH 4 DAYS Performed at [facility] [place] [facility], [place] [address], [place], [place] [address]    Report Status PENDING  Incomplete  Urine Culture     Status: None   Collection Time: 03/11/22  1:43 PM   Specimen: In/Out Cath Urine  Result Value Ref Range Status   Specimen Description IN/OUT CATH URINE  Final   Special Requests NONE  Final   Culture   Final    NO GROWTH Performed at [facility], [place] [address] [place], [place] [address]    Report Status 03/12/2022 FINAL  Final     Time coordinating discharge: Over 30 minutes  SIGNED:   Phillips Climes, MD  [facility] 03/15/2022, 11:33 AM Pager   If 7PM-7AM, please contact night-coverage [email or web address]

## 2022-03-15 NOTE — TOC Progression Note (Signed)
Transition of Care [facility]) - Progression Note    Patient Details  Name: [name] MRN: [id number] Date of Birth: February 24, 1951  Transition of Care [facility]) CM/SW Contact  Loletha Grayer Beverely Pace, RN Phone Number: 03/15/2022, 9:53 AM  Clinical Narrative:    Case manager has requested Nebulizer machine and shower chair from [employer], will be delivered to patient's room.    Expected Discharge Plan: Home/Self Care Barriers to Discharge: Continued Medical Work up  Expected Discharge Plan and Services Expected Discharge Plan: Home/Self Care   Discharge Planning Services: CM Consult Post Acute Care Choice: Durable Medical Equipment   Expected Discharge Date: 03/15/22               DME Arranged: Nebulizer machine, Other see comment (shower chair) DME Agency: [employer] Date DME Agency Contacted: 03/15/22 Time DME Agency Contacted: [phone] Representative spoke with at DME Agency: Kristen/LaKretia             Social Determinants of Health ([place]) Interventions    Readmission Risk Interventions    07/27/2021   10:06 AM 07/26/2021    1:16 PM  Readmission Risk Prevention Plan  Transportation Screening Complete Complete  PCP or Specialist Appt within 5-7 Days  Complete  PCP or Specialist Appt within 3-5 Days Complete   Home Care Screening  Complete  Medication Review (RN CM)  Complete  HRI or Home Care Consult Complete   Social Work Consult for [place] Planning/Counseling Complete   Palliative Care Screening Not Applicable   Medication Review Press photographer) Complete

## 2022-03-15 NOTE — Discharge Instructions (Signed)
Follow with Primary MD [name], DO in 7 days   Get CBC, CMP, 2 view Chest X ray checked  by Primary MD next visit.    Activity: As tolerated with Full fall precautions use walker/cane & assistance as needed   Disposition Home    Diet: Heart Healthy/Carb modiifed  For Heart failure patients - Check your Weight same time everyday, if you gain over 2 pounds, or you develop in leg swelling, experience more shortness of breath or chest pain, call your Primary MD immediately. Follow Cardiac Low Salt Diet and 1.5 lit/day fluid restriction.   On your next visit with your primary care physician please Get Medicines reviewed and adjusted.   Please request your Prim.MD to go over all Hospital Tests and Procedure/Radiological results at the follow up, please get all Hospital records sent to your Prim MD by signing hospital release before you go home.   If you experience worsening of your admission symptoms, develop shortness of breath, life threatening emergency, suicidal or homicidal thoughts you must seek medical attention immediately by calling 911 or calling your MD immediately  if symptoms less severe.  You Must read complete instructions/literature along with all the possible adverse reactions/side effects for all the Medicines you take and that have been prescribed to you. Take any new Medicines after you have completely understood and accpet all the possible adverse reactions/side effects.   Do not drive, operating heavy machinery, perform activities at heights, swimming or participation in water activities or provide baby sitting services if your were admitted for syncope or siezures until you have seen by Primary MD or a Neurologist and advised to do so again.  Do not drive when taking Pain medications.    Do not take more than prescribed Pain, Sleep and Anxiety Medications  Special Instructions: If you have smoked or chewed Tobacco  in the last 2 yrs please stop smoking, stop  any regular Alcohol  and or any Recreational drug use.  Wear Seat belts while driving.   Please note  You were cared for by a hospitalist during your hospital stay. If you have any questions about your discharge medications or the care you received while you were in the hospital after you are discharged, you can call the unit and asked to speak with the hospitalist on call if the hospitalist that took care of you is not available. Once you are discharged, your primary care physician will handle any further medical issues. Please note that NO REFILLS for any discharge medications will be authorized once you are discharged, as it is imperative that you return to your primary care physician (or establish a relationship with a primary care physician if you do not have one) for your aftercare needs so that they can reassess your need for medications and monitor your lab values.

## 2022-03-15 NOTE — Inpatient Diabetes Management (Signed)
Inpatient Diabetes Program Recommendations  AACE/ADA: New Consensus Statement on Inpatient Glycemic Control   Target Ranges:  Prepandial:   less than 140 mg/dL      Peak postprandial:   less than 180 mg/dL (1-2 hours)      Critically ill patients:  140 - 180 mg/dL    Latest Reference Range & Units 03/14/22 07:53 03/14/22 09:21 03/14/22 13:34 03/14/22 17:07 03/14/22 20:28 03/14/22 23:41 03/15/22 08:09  Glucose-Capillary 70 - 99 mg/dL 203 (H) 253 (H) 241 (H) 341 (H) 373 (H) 401 (H) 291 (H)   Review of Glycemic Control  Diabetes history: DM2 Outpatient Diabetes medications: Lantus 44 units QAM, Lantus 42 units QPM, Glucotrol 20 mg daily, Metformin 1000 mg QAM, Metformin 500 mg QPM Current orders for Inpatient glycemic control: Semglee 36 units QAM, Semglee 28 units QHS, Novolog 0-15 units TID with meals, Novolog 0-5 units QHS; Prednisone 15 mg daily     Inpatient Diabetes Program Recommendations:     Insulin: Patient received Solumedrol 80 mg at 9:47 am on 03/14/22 which is contributing to hyperglycemia.  If steroids are continued, please consider increasing Semglee to 38 units QAM, Semglee to 30 units QHS, and ordering Novolog 4 units TID with meals for meal coverage if patient eats at least 50% of meals.   Outpatient DM: Patient will need Rx for Dexcom from Endocrinology Peri [place], PA-C) as he will need to be educated on how to apply and use the Dexcom CGM.   NOTE: Patient sees [facility] for DM management and last seen Peri [place], PA-C on 02/12/22. Per note by Lyndel Safe, CPhT on 03/13/22, Prior Authorization for [employer], Clinical biochemist) has been approved.    PA# [id number] Effective dates: 03/13/2022 through 03/14/2023.    Thanks, [name] Alderman, RN, MSN, [place] Diabetes Coordinator Inpatient Diabetes Program [phone] (Team Pager from 8am to [place])

## 2022-03-15 NOTE — Plan of Care (Signed)
[name] woke up appeared confused. At the beginning of shift, his wife shared that his CBG sometimes get low during the night. VSS, CBG check. Patient able to reoriented back to baseline.   NSG PCO: Continue with frequent rounds.   Problem: Coping: Goal: Ability to adjust to condition or change in health will improve Outcome: Progressing   Problem: Health Behavior/Discharge Planning: Goal: Ability to identify and utilize available resources and services will improve Outcome: Progressing Goal: Ability to manage health-related needs will improve Outcome: Progressing   Problem: Education: Goal: Knowledge of General Education information will improve Description: Including pain rating scale, medication(s)/side effects and non-pharmacologic comfort measures Outcome: Progressing   Problem: Metabolic: Goal: Ability to maintain appropriate glucose levels will improve Outcome: Progressing

## 2022-03-16 ENCOUNTER — Encounter (HOSPITAL_COMMUNITY): Payer: Self-pay

## 2022-03-16 ENCOUNTER — Other Ambulatory Visit: Payer: Self-pay

## 2022-03-16 ENCOUNTER — Telehealth: Payer: Self-pay | Admitting: General Practice

## 2022-03-16 ENCOUNTER — Emergency Department (HOSPITAL_COMMUNITY): Payer: Medicare Other

## 2022-03-16 ENCOUNTER — Emergency Department (HOSPITAL_COMMUNITY)
Admission: EM | Admit: 2022-03-16 | Discharge: 2022-03-16 | Disposition: A | Discharge status: Home / Self Care | Payer: Medicare Other | Attending: Emergency Medicine | Admitting: Emergency Medicine

## 2022-03-16 ENCOUNTER — Telehealth: Payer: Self-pay | Admitting: Family

## 2022-03-16 DIAGNOSIS — N1831 Chronic kidney disease, stage 3a: Secondary | ICD-10-CM | POA: Diagnosis not present

## 2022-03-16 DIAGNOSIS — R531 Weakness: Secondary | ICD-10-CM

## 2022-03-16 DIAGNOSIS — W06XXXA Fall from bed, initial encounter: Secondary | ICD-10-CM | POA: Insufficient documentation

## 2022-03-16 DIAGNOSIS — G319 Degenerative disease of nervous system, unspecified: Secondary | ICD-10-CM | POA: Insufficient documentation

## 2022-03-16 DIAGNOSIS — S199XXA Unspecified injury of neck, initial encounter: Secondary | ICD-10-CM | POA: Diagnosis not present

## 2022-03-16 DIAGNOSIS — R600 Localized edema: Secondary | ICD-10-CM

## 2022-03-16 DIAGNOSIS — I5030 Unspecified diastolic (congestive) heart failure: Secondary | ICD-10-CM | POA: Diagnosis not present

## 2022-03-16 DIAGNOSIS — M25551 Pain in right hip: Secondary | ICD-10-CM | POA: Diagnosis not present

## 2022-03-16 DIAGNOSIS — E039 Hypothyroidism, unspecified: Secondary | ICD-10-CM | POA: Insufficient documentation

## 2022-03-16 DIAGNOSIS — E119 Type 2 diabetes mellitus without complications: Secondary | ICD-10-CM

## 2022-03-16 DIAGNOSIS — S0990XA Unspecified injury of head, initial encounter: Secondary | ICD-10-CM | POA: Diagnosis not present

## 2022-03-16 DIAGNOSIS — Z043 Encounter for examination and observation following other accident: Secondary | ICD-10-CM | POA: Diagnosis not present

## 2022-03-16 DIAGNOSIS — I130 Hypertensive heart and chronic kidney disease with heart failure and stage 1 through stage 4 chronic kidney disease, or unspecified chronic kidney disease: Secondary | ICD-10-CM | POA: Diagnosis not present

## 2022-03-16 DIAGNOSIS — E1122 Type 2 diabetes mellitus with diabetic chronic kidney disease: Secondary | ICD-10-CM | POA: Insufficient documentation

## 2022-03-16 DIAGNOSIS — Z7901 Long term (current) use of anticoagulants: Secondary | ICD-10-CM | POA: Diagnosis not present

## 2022-03-16 DIAGNOSIS — M1611 Unilateral primary osteoarthritis, right hip: Secondary | ICD-10-CM | POA: Diagnosis not present

## 2022-03-16 DIAGNOSIS — I129 Hypertensive chronic kidney disease with stage 1 through stage 4 chronic kidney disease, or unspecified chronic kidney disease: Secondary | ICD-10-CM | POA: Diagnosis not present

## 2022-03-16 LAB — CBC WITH DIFFERENTIAL/PLATELET
Abs Immature Granulocytes: 0.28 10*3/uL — ABNORMAL HIGH (ref 0.00–0.07)
Basophils Absolute: 0 10*3/uL (ref 0.0–0.1)
Basophils Relative: 0 %
Eosinophils Absolute: 0.1 10*3/uL (ref 0.0–0.5)
Eosinophils Relative: 1 %
HCT: 28.4 % — ABNORMAL LOW (ref 39.0–52.0)
Hemoglobin: 9.1 g/dL — ABNORMAL LOW (ref 13.0–17.0)
Immature Granulocytes: 5 %
Lymphocytes Relative: 18 %
Lymphs Abs: 1.1 10*3/uL (ref 0.7–4.0)
MCH: 32.4 pg (ref 26.0–34.0)
MCHC: 32 g/dL (ref 30.0–36.0)
MCV: 101.1 fL — ABNORMAL HIGH (ref 80.0–100.0)
Monocytes Absolute: 0.5 10*3/uL (ref 0.1–1.0)
Monocytes Relative: 8 %
Neutro Abs: 4.1 10*3/uL (ref 1.7–7.7)
Neutrophils Relative %: 68 %
Platelets: 158 10*3/uL (ref 150–400)
RBC: 2.81 MIL/uL — ABNORMAL LOW (ref 4.22–5.81)
RDW: 14.3 % (ref 11.5–15.5)
WBC: 6.1 10*3/uL (ref 4.0–10.5)
nRBC: 1.3 % — ABNORMAL HIGH (ref 0.0–0.2)

## 2022-03-16 LAB — BASIC METABOLIC PANEL WITH GFR
Anion gap: 12 (ref 5–15)
BUN: 29 mg/dL — ABNORMAL HIGH (ref 8–23)
CO2: 23 mmol/L (ref 22–32)
Calcium: 9.2 mg/dL (ref 8.9–10.3)
Chloride: 103 mmol/L (ref 98–111)
Creatinine, Ser: 1.57 mg/dL — ABNORMAL HIGH (ref 0.61–1.24)
GFR, Estimated: 46 mL/min — ABNORMAL LOW
Glucose, Bld: 145 mg/dL — ABNORMAL HIGH (ref 70–99)
Potassium: 3.8 mmol/L (ref 3.5–5.1)
Sodium: 138 mmol/L (ref 135–145)

## 2022-03-16 LAB — I-STAT CHEM 8, ED
BUN: 30 mg/dL — ABNORMAL HIGH (ref 8–23)
Calcium, Ion: 1.1 mmol/L — ABNORMAL LOW (ref 1.15–1.40)
Chloride: 102 mmol/L (ref 98–111)
Creatinine, Ser: 1.7 mg/dL — ABNORMAL HIGH (ref 0.61–1.24)
Glucose, Bld: 137 mg/dL — ABNORMAL HIGH (ref 70–99)
HCT: 28 % — ABNORMAL LOW (ref 39.0–52.0)
Hemoglobin: 9.5 g/dL — ABNORMAL LOW (ref 13.0–17.0)
Potassium: 3.7 mmol/L (ref 3.5–5.1)
Sodium: 137 mmol/L (ref 135–145)
TCO2: 25 mmol/L (ref 22–32)

## 2022-03-16 LAB — CULTURE, BLOOD (ROUTINE X 2)
Culture: NO GROWTH
Culture: NO GROWTH

## 2022-03-16 MED ORDER — LACTATED RINGERS IV BOLUS
500.0000 mL | Freq: Once | INTRAVENOUS | Status: AC
  Administered 2022-03-16: 500 mL via INTRAVENOUS

## 2022-03-16 NOTE — Discharge Instructions (Addendum)
It was our pleasure to provide your ER care today - we hope that you feel better.  Make sure to drink plenty of fluids/stay well hydrated.   For leg swelling, see attached info - elevated legs as need to help with swelling, consider use of compression hose.   As relates chronic kidney disease - see attached info.  Make sure to avoid nsaid type medications such as ibuprofen/motrin or naprosyn/aleve.   Follow up closely with your primary care doctor and cardiologist in the next 1-2 weeks - make sure to bring all your current meds with you to those appointments, and discuss possible med adjustment then.  Fall precautions - use great care/caution to help minimize risk of falling when up and about.   Return to ER right away if worse, new symptoms, fevers, chest pain, trouble breathing, new/severe pain, weak/fainting, or other concern.

## 2022-03-16 NOTE — ED Provider Notes (Signed)
Pt signed out at 0730 to d/c to home if/when imaging studies negative for acute process.   Imaging neg for fracture.   Po fluids/food. Ambulate in hall.  Pt currently appears stable for d/c per earlier plan.    Cathren Laine, MD 03/16/22 [phone]

## 2022-03-16 NOTE — ED Triage Notes (Signed)
Arrives EMS from home after rolling off bed and hitting head on night stand.   Compliant on Eliquis. Denies LOC.   Initial bp 80/40. Dizziness when standing.   Downgraded from Level 1 to Level 2.

## 2022-03-16 NOTE — ED Notes (Signed)
Patient transported to X-ray 

## 2022-03-16 NOTE — ED Provider Notes (Signed)
[facility] EMERGENCY DEPARTMENT Provider Note   CSN: [id number] Arrival date & time: 03/16/22  [id number]     History  No chief complaint on file.   [name] is a 71 y.o. male.  HPI     This is a 71 year old male who presents as a level 1 trauma.  EMS initially called out as he had a fall on Eliquis.  Initial blood pressure was 80 systolic.  Repeat blood pressure in route much improved and blood pressure here 142/64.  Patient was downgraded to a level 2 trauma.  Per report, patient rolled out of bed and hit his head on the nightstand.  No loss of consciousness.  He complained of some dizziness with standing.  At baseline has some chronic pain and lower extremity swelling for which she has been worked up for.  He states he has right hip pain but that is not necessarily unchanged from prior.  Wife is now at the bedside.  States that patient was just discharged yesterday from the hospital after being admitted for pneumonia and RSV positive.  Reports that he has been dizzy when standing at home.  I have reviewed his other chart.  This is the summary of his recent discharge.  Discharge Summary 03/16/22: "[name] is a 71 y.o. male with PMH significant for DM2, HTN, diastolic CHF, autoimmune myopathy/myositis, hypothyroidism, PE/DVT, and CKD stage III . 11/12, patient was brought to the ED from home for altered mental status.   In the ED, patient was afebrile, heart rate 90s, blood pressure 100s Labs with WC count normal, hemoglobin low at 9.7, creatinine elevated to 1.32, ammonia slightly elevated to 48, CK total elevated 1500, troponin elevated 35. CT head did not show any acute intracranial findings. Chest x-ray showed mild diffuse interstitial prominence and peribronchial cuffing, most severe at the left lung base.  Findings concerning for bronchitis and probable developing left basilar bronchopneumonia. RVP positive for RSV. Patient was started on IV Rocephin,  azithromycin, was admitted to the hospital for further management."  Home Medications Prior to Admission medications   Not on File      Allergies    Patient has no known allergies.    Review of Systems   Review of Systems  Constitutional:  Negative for fever.  Respiratory:  Negative for shortness of breath.   Cardiovascular:  Negative for chest pain.  Neurological:  Positive for dizziness.  All other systems reviewed and are negative.   Physical Exam Updated Vital Signs BP 129/75   Pulse 85   Temp (!) 97.5 F (36.4 C) (Temporal)   Resp 18   Ht 1.727 m (5\' 8" )   Wt 95.3 kg   SpO2 94%   BMI 31.93 kg/m  Physical Exam Vitals and nursing note reviewed.  Constitutional:      Appearance: He is well-developed. He is not ill-appearing.     Comments: ABCs intact  HENT:     Head: Normocephalic and atraumatic.     Mouth/Throat:     Mouth: Mucous membranes are dry.  Eyes:     Pupils: Pupils are equal, round, and reactive to light.  Cardiovascular:     Rate and Rhythm: Normal rate and regular rhythm.     Heart sounds: Normal heart sounds. No murmur heard. Pulmonary:     Effort: Pulmonary effort is normal. No respiratory distress.     Breath sounds: Normal breath sounds.  Abdominal:     Palpations: Abdomen is soft.  Tenderness: There is no abdominal tenderness. There is no rebound.  Musculoskeletal:        General: No deformity.     Cervical back: Neck supple.     Comments: Normal range of motion bilateral hips and knees, no obvious deformities  Lymphadenopathy:     Cervical: No cervical adenopathy.  Skin:    General: Skin is warm and dry.  Neurological:     Mental Status: He is alert and oriented to person, place, and time.  Psychiatric:        Mood and Affect: Mood normal.     ED Results / Procedures / Treatments   Labs (all labs ordered are listed, but only abnormal results are displayed) Labs Reviewed  CBC WITH DIFFERENTIAL/PLATELET - Abnormal; Notable  for the following components:      Result Value   RBC 2.81 (*)    Hemoglobin 9.1 (*)    HCT 28.4 (*)    MCV 101.1 (*)    nRBC 1.3 (*)    Abs Immature Granulocytes 0.28 (*)    All other components within normal limits  I-STAT CHEM 8, ED - Abnormal; Notable for the following components:   BUN 30 (*)    Creatinine, Ser 1.70 (*)    Glucose, Bld 137 (*)    Calcium, Ion 1.10 (*)    Hemoglobin 9.5 (*)    HCT 28.0 (*)    All other components within normal limits  BASIC METABOLIC PANEL    EKG EKG Interpretation  Date/Time:  Friday March 16 2022 06:45:06 EST Ventricular Rate:  81 PR Interval:  150 QRS Duration: 90 QT Interval:  387 QTC Calculation: 450 R Axis:   10 Text Interpretation: Sinus rhythm Atrial premature complex Abnormal R-wave progression, early transition Minimal ST elevation, anterior leads Confirmed by Ross Marcus ([id number]) on 03/16/2022 6:58:59 AM  Radiology No results found.  Procedures Procedures    Medications Ordered in ED Medications - No data to display  ED Course/ Medical Decision Making/ A&P                           Medical Decision Making Amount and/or Complexity of Data Reviewed Labs: ordered. Radiology: ordered.   This patient presents to the ED for concern of fall on Eliquis, this involves an extensive number of treatment options, and is a complaint that carries with it a high risk of complications and morbidity.  I considered the following differential and admission for this acute, potentially life threatening condition.  The differential diagnosis includes life-threatening traumatic injury such as subarachnoid hemorrhage, subdural, long bone injury, hip fracture  MDM:    This is a 71 year old male who presents initially as a level 1 trauma.  He was downgraded to a level 2 after his blood pressure was noted to be normal.  Recent hospitalization for RSV and bronchopneumonia.  He is overall nontoxic.  Clinically he is dry appearing.  No  external signs of trauma.  CT head neck obtained.  These are pending.  Chest and pelvic x-rays were also obtained.  I have sent some basic lab work as he does appear significantly volume overloaded.  Hemoglobin is 9.  Creatinine 1.7 which appears slightly elevated when compared to labs from yesterday morning.    (Labs, imaging, consults)  Labs: I Ordered, and personally interpreted labs.  The pertinent results include: CBC, BMP  Imaging Studies ordered: I ordered imaging studies including CT head, cervical spine, x-ray chest  and pelvis I independently visualized and interpreted imaging. I agree with the radiologist interpretation  Additional history obtained from wife, EMS.  External records from outside source obtained and reviewed including recent discharge summary  Cardiac Monitoring: The patient was maintained on a cardiac monitor.  I personally viewed and interpreted the cardiac monitored which showed an underlying rhythm of: Sinus rhythm  Reevaluation: After the interventions noted above, I reevaluated the patient and found that they have :stayed the same  Social Determinants of Health:  lives independently  Disposition: pending  Co morbidities that complicate the patient evaluation History reviewed. No pertinent past medical history.   Medicines No orders of the defined types were placed in this encounter.   I have reviewed the patients home medicines and have made adjustments as needed  Problem List / ED Course: Problem List Items Addressed This Visit   None               Final Clinical Impression(s) / ED Diagnoses Final diagnoses:  None    Rx / DC Orders ED Discharge Orders     None         Merryl Hacker, MD 03/16/22 [id number]

## 2022-03-16 NOTE — Telephone Encounter (Signed)
Transition Care Management Unsuccessful Follow-up Telephone Call  Date of discharge and from where:  03/16/22 from [facility]  Attempts:  1st Attempt  Reason for unsuccessful TCM follow-up call:  Left voice message

## 2022-03-16 NOTE — Telephone Encounter (Signed)
Forwarding as FYI for upcoming appointment

## 2022-03-16 NOTE — Telephone Encounter (Signed)
  Pt c/o BP issue: STAT if pt c/o blurred vision, one-sided weakness or slurred speech  1. What are your last 5 BP readings?  74/40 - EMS 78/50 61 - ED 109/64 94 - at the house   2. Are you having any other symptoms (ex. Dizziness, headache, blurred vision, passed out)?   3. What is your BP issue? Pt's wife said, pt been in the hospital due to low BP. They r/s an appt with Caitlin on 03/20/22 at 8 am

## 2022-03-16 NOTE — ED Notes (Signed)
Trauma Response Nurse Documentation   Michoel Timmons is a 71 y.o. male arriving to [facility] ED via [facility]  On Eliquis (apixaban) daily. Trauma was activated as a Level 1 by Rande Brunt based on the following trauma criteria Anytime Systolic Blood Pressure < 90. Trauma team at the bedside on patient arrival. Downgraded to Level 2 upon arrival.  Patient cleared for CT by Dr. Wilkie Aye. Pt transported to CT with trauma response nurse present to monitor. RN remained with the patient throughout their absence from the department for clinical observation.   GCS 15.  History           Initial Focused Assessment (If applicable, or please see trauma documentation): Airway-- intact, no visible obstruction Breathing-- spontaneous, unlabored Circulation-- no visible bleeding noted  CT's Completed:   CT Head and CT C-Spine   Interventions:   Plan for disposition:  Unknown at this time  Consults completed:  none at 0650.  Event Summary: Patient brought in by [facility] from home, patient rolled out of bed and struck his head on night stand. Patient initial BP in 80s. Upon arrival, patient manual BP 142/64. Downgraded to Level 2. No obvious injuries noted. CT head, c-spine completed. Basic labs obtained.    Bedside handoff with ED RN Riki Rusk.    Leota Sauers  Trauma Response RN  Please call TRN at [phone] for further assistance.

## 2022-03-16 NOTE — Discharge Planning (Signed)
Brittne Kawasaki J. Lucretia Roers, RN, BSN, [place] [phone] RNCM consulted regarding discharge planning for Home Health Services. Offered pt medicare.gov list of home health agencies to choose from.  Pt chose [facility] to render PT and OT services. Gwenlyn Fudge of [facility] notified. Patient made aware that [facility] will be in contact in 24-48 hours.  No DME needs identified at this time.

## 2022-03-18 NOTE — Telephone Encounter (Signed)
Pt recent admitted. Per discharge summary "Delene Loll has been discontinued, and his Coreg dose has been decreased in half, he is to follow up with cardiology as an outpatient. " Was discharged <1 week ago.   Can we please inquire if BP are since discharge? He did have ED visit for fall.   If BP still low, decrease Coreg to 3.'125mg'$  BID. Ensure not taking Entresto. Make position changes slowly, wear knee high compression stockings, eat regular meals, stay hydrated to prevent hypotension.   Loel Dubonnet, NP

## 2022-03-19 ENCOUNTER — Emergency Department (HOSPITAL_COMMUNITY): Payer: No Typology Code available for payment source

## 2022-03-19 ENCOUNTER — Other Ambulatory Visit: Payer: Self-pay

## 2022-03-19 ENCOUNTER — Inpatient Hospital Stay (HOSPITAL_COMMUNITY)
Admission: EM | Admit: 2022-03-19 | Discharge: 2022-03-24 | DRG: 637 | Disposition: A | Discharge status: Home Health | Payer: No Typology Code available for payment source | Attending: Family Medicine | Admitting: Family Medicine

## 2022-03-19 ENCOUNTER — Ambulatory Visit
Admission: EM | Admit: 2022-03-19 | Discharge: 2022-03-19 | Disposition: A | Discharge status: Home / Self Care | Payer: Federal, State, Local not specified - PPO | Attending: Internal Medicine | Admitting: Internal Medicine

## 2022-03-19 DIAGNOSIS — I5042 Chronic combined systolic (congestive) and diastolic (congestive) heart failure: Secondary | ICD-10-CM | POA: Diagnosis not present

## 2022-03-19 DIAGNOSIS — I2510 Atherosclerotic heart disease of native coronary artery without angina pectoris: Secondary | ICD-10-CM | POA: Diagnosis present

## 2022-03-19 DIAGNOSIS — N1831 Chronic kidney disease, stage 3a: Secondary | ICD-10-CM | POA: Diagnosis not present

## 2022-03-19 DIAGNOSIS — R6520 Severe sepsis without septic shock: Secondary | ICD-10-CM

## 2022-03-19 DIAGNOSIS — W19XXXA Unspecified fall, initial encounter: Secondary | ICD-10-CM

## 2022-03-19 DIAGNOSIS — R066 Hiccough: Secondary | ICD-10-CM | POA: Diagnosis present

## 2022-03-19 DIAGNOSIS — Z6830 Body mass index (BMI) 30.0-30.9, adult: Secondary | ICD-10-CM

## 2022-03-19 DIAGNOSIS — E039 Hypothyroidism, unspecified: Secondary | ICD-10-CM | POA: Diagnosis present

## 2022-03-19 DIAGNOSIS — N400 Enlarged prostate without lower urinary tract symptoms: Secondary | ICD-10-CM | POA: Diagnosis present

## 2022-03-19 DIAGNOSIS — Z794 Long term (current) use of insulin: Secondary | ICD-10-CM

## 2022-03-19 DIAGNOSIS — Z888 Allergy status to other drugs, medicaments and biological substances status: Secondary | ICD-10-CM

## 2022-03-19 DIAGNOSIS — Z86718 Personal history of other venous thrombosis and embolism: Secondary | ICD-10-CM | POA: Diagnosis not present

## 2022-03-19 DIAGNOSIS — Z1152 Encounter for screening for COVID-19: Secondary | ICD-10-CM

## 2022-03-19 DIAGNOSIS — Z79899 Other long term (current) drug therapy: Secondary | ICD-10-CM

## 2022-03-19 DIAGNOSIS — E872 Acidosis, unspecified: Secondary | ICD-10-CM | POA: Diagnosis present

## 2022-03-19 DIAGNOSIS — E1122 Type 2 diabetes mellitus with diabetic chronic kidney disease: Secondary | ICD-10-CM | POA: Diagnosis present

## 2022-03-19 DIAGNOSIS — I10 Essential (primary) hypertension: Secondary | ICD-10-CM | POA: Diagnosis present

## 2022-03-19 DIAGNOSIS — M069 Rheumatoid arthritis, unspecified: Secondary | ICD-10-CM | POA: Diagnosis present

## 2022-03-19 DIAGNOSIS — Z91013 Allergy to seafood: Secondary | ICD-10-CM

## 2022-03-19 DIAGNOSIS — G9341 Metabolic encephalopathy: Secondary | ICD-10-CM | POA: Diagnosis present

## 2022-03-19 DIAGNOSIS — I5032 Chronic diastolic (congestive) heart failure: Secondary | ICD-10-CM | POA: Diagnosis present

## 2022-03-19 DIAGNOSIS — Z7901 Long term (current) use of anticoagulants: Secondary | ICD-10-CM

## 2022-03-19 DIAGNOSIS — E785 Hyperlipidemia, unspecified: Secondary | ICD-10-CM | POA: Diagnosis present

## 2022-03-19 DIAGNOSIS — E860 Dehydration: Secondary | ICD-10-CM | POA: Diagnosis present

## 2022-03-19 DIAGNOSIS — E1165 Type 2 diabetes mellitus with hyperglycemia: Secondary | ICD-10-CM | POA: Diagnosis present

## 2022-03-19 DIAGNOSIS — R600 Localized edema: Secondary | ICD-10-CM | POA: Diagnosis not present

## 2022-03-19 DIAGNOSIS — R296 Repeated falls: Secondary | ICD-10-CM

## 2022-03-19 DIAGNOSIS — Z833 Family history of diabetes mellitus: Secondary | ICD-10-CM

## 2022-03-19 DIAGNOSIS — Z86711 Personal history of pulmonary embolism: Secondary | ICD-10-CM | POA: Diagnosis present

## 2022-03-19 DIAGNOSIS — Z8249 Family history of ischemic heart disease and other diseases of the circulatory system: Secondary | ICD-10-CM | POA: Diagnosis not present

## 2022-03-19 DIAGNOSIS — K219 Gastro-esophageal reflux disease without esophagitis: Secondary | ICD-10-CM | POA: Diagnosis present

## 2022-03-19 DIAGNOSIS — I2583 Coronary atherosclerosis due to lipid rich plaque: Secondary | ICD-10-CM | POA: Diagnosis not present

## 2022-03-19 DIAGNOSIS — E162 Hypoglycemia, unspecified: Secondary | ICD-10-CM | POA: Diagnosis present

## 2022-03-19 DIAGNOSIS — E11649 Type 2 diabetes mellitus with hypoglycemia without coma: Secondary | ICD-10-CM | POA: Diagnosis present

## 2022-03-19 DIAGNOSIS — F4310 Post-traumatic stress disorder, unspecified: Secondary | ICD-10-CM | POA: Diagnosis present

## 2022-03-19 DIAGNOSIS — M25511 Pain in right shoulder: Secondary | ICD-10-CM | POA: Diagnosis present

## 2022-03-19 DIAGNOSIS — Z8261 Family history of arthritis: Secondary | ICD-10-CM

## 2022-03-19 DIAGNOSIS — N179 Acute kidney failure, unspecified: Secondary | ICD-10-CM | POA: Diagnosis present

## 2022-03-19 DIAGNOSIS — I252 Old myocardial infarction: Secondary | ICD-10-CM | POA: Diagnosis not present

## 2022-03-19 DIAGNOSIS — I130 Hypertensive heart and chronic kidney disease with heart failure and stage 1 through stage 4 chronic kidney disease, or unspecified chronic kidney disease: Secondary | ICD-10-CM | POA: Diagnosis present

## 2022-03-19 DIAGNOSIS — M25519 Pain in unspecified shoulder: Secondary | ICD-10-CM | POA: Insufficient documentation

## 2022-03-19 DIAGNOSIS — Y92009 Unspecified place in unspecified non-institutional (private) residence as the place of occurrence of the external cause: Secondary | ICD-10-CM

## 2022-03-19 DIAGNOSIS — Z885 Allergy status to narcotic agent status: Secondary | ICD-10-CM

## 2022-03-19 DIAGNOSIS — A419 Sepsis, unspecified organism: Secondary | ICD-10-CM

## 2022-03-19 DIAGNOSIS — Z7952 Long term (current) use of systemic steroids: Secondary | ICD-10-CM

## 2022-03-19 DIAGNOSIS — D539 Nutritional anemia, unspecified: Secondary | ICD-10-CM | POA: Diagnosis present

## 2022-03-19 DIAGNOSIS — E119 Type 2 diabetes mellitus without complications: Secondary | ICD-10-CM

## 2022-03-19 DIAGNOSIS — N183 Chronic kidney disease, stage 3 unspecified: Secondary | ICD-10-CM | POA: Diagnosis present

## 2022-03-19 DIAGNOSIS — E669 Obesity, unspecified: Secondary | ICD-10-CM | POA: Diagnosis present

## 2022-03-19 DIAGNOSIS — Z7984 Long term (current) use of oral hypoglycemic drugs: Secondary | ICD-10-CM

## 2022-03-19 DIAGNOSIS — G7249 Other inflammatory and immune myopathies, not elsewhere classified: Secondary | ICD-10-CM | POA: Diagnosis not present

## 2022-03-19 LAB — COMPREHENSIVE METABOLIC PANEL
ALT: 32 U/L (ref 0–44)
AST: 26 U/L (ref 15–41)
Albumin: 3.9 g/dL (ref 3.5–5.0)
Alkaline Phosphatase: 54 U/L (ref 38–126)
Anion gap: 11 (ref 5–15)
BUN: 39 mg/dL — ABNORMAL HIGH (ref 8–23)
CO2: 25 mmol/L (ref 22–32)
Calcium: 9.8 mg/dL (ref 8.9–10.3)
Chloride: 108 mmol/L (ref 98–111)
Creatinine, Ser: 1.64 mg/dL — ABNORMAL HIGH (ref 0.61–1.24)
GFR, Estimated: 44 mL/min — ABNORMAL LOW (ref >60)
Glucose, Bld: 81 mg/dL (ref 70–99)
Potassium: 4.2 mmol/L (ref 3.5–5.1)
Sodium: 144 mmol/L (ref 135–145)
Total Bilirubin: 0.8 mg/dL (ref 0.3–1.2)
Total Protein: 7 g/dL (ref 6.5–8.1)

## 2022-03-19 LAB — CBG MONITORING, ED
Glucose-Capillary: 69 mg/dL — ABNORMAL LOW (ref 70–99)
Glucose-Capillary: 127 mg/dL — ABNORMAL HIGH (ref 70–99)
Glucose-Capillary: 150 mg/dL — ABNORMAL HIGH (ref 70–99)
Glucose-Capillary: 150 mg/dL — ABNORMAL HIGH (ref 70–99)

## 2022-03-19 LAB — LACTIC ACID, PLASMA: Lactic Acid, Venous: 3.5 mmol/L (ref 0.5–1.9)

## 2022-03-19 LAB — CBC WITH DIFFERENTIAL/PLATELET
Abs Immature Granulocytes: 0.09 10*3/uL — ABNORMAL HIGH (ref 0.00–0.07)
Basophils Absolute: 0 10*3/uL (ref 0.0–0.1)
Basophils Relative: 0 %
Eosinophils Absolute: 0.1 10*3/uL (ref 0.0–0.5)
Eosinophils Relative: 1 %
HCT: 32.4 % — ABNORMAL LOW (ref 39.0–52.0)
Hemoglobin: 10.4 g/dL — ABNORMAL LOW (ref 13.0–17.0)
Immature Granulocytes: 1 %
Lymphocytes Relative: 21 %
Lymphs Abs: 2 10*3/uL (ref 0.7–4.0)
MCH: 31.7 pg (ref 26.0–34.0)
MCHC: 32.1 g/dL (ref 30.0–36.0)
MCV: 98.8 fL (ref 80.0–100.0)
Monocytes Absolute: 0.2 10*3/uL (ref 0.1–1.0)
Monocytes Relative: 2 %
Neutro Abs: 7.2 10*3/uL (ref 1.7–7.7)
Neutrophils Relative %: 75 %
Platelets: 264 10*3/uL (ref 150–400)
RBC: 3.28 MIL/uL — ABNORMAL LOW (ref 4.22–5.81)
RDW: 14.5 % (ref 11.5–15.5)
WBC: 9.6 10*3/uL (ref 4.0–10.5)
nRBC: 1.2 % — ABNORMAL HIGH (ref 0.0–0.2)

## 2022-03-19 LAB — CK: Total CK: 364 U/L (ref 49–397)

## 2022-03-19 LAB — BRAIN NATRIURETIC PEPTIDE: B Natriuretic Peptide: 54.3 pg/mL (ref 0.0–100.0)

## 2022-03-19 LAB — TROPONIN I (HIGH SENSITIVITY): Troponin I (High Sensitivity): 27 ng/L — ABNORMAL HIGH (ref <18)

## 2022-03-19 MED ORDER — ACETAMINOPHEN 500 MG PO TABS
1000.0000 mg | ORAL_TABLET | Freq: Once | ORAL | Status: AC
  Administered 2022-03-19: 1000 mg via ORAL
  Filled 2022-03-19: qty 2

## 2022-03-19 MED ORDER — VANCOMYCIN HCL IN DEXTROSE 1-5 GM/200ML-% IV SOLN
1000.0000 mg | Freq: Once | INTRAVENOUS | Status: DC
  Filled 2022-03-19: qty 200

## 2022-03-19 MED ORDER — VANCOMYCIN HCL 2000 MG/400ML IV SOLN
2000.0000 mg | Freq: Once | INTRAVENOUS | Status: AC
  Administered 2022-03-19: 2000 mg via INTRAVENOUS
  Filled 2022-03-19: qty 400

## 2022-03-19 MED ORDER — HYDROMORPHONE HCL 1 MG/ML IJ SOLN
0.5000 mg | Freq: Once | INTRAMUSCULAR | Status: AC
  Administered 2022-03-19: 0.5 mg via INTRAVENOUS
  Filled 2022-03-19: qty 1

## 2022-03-19 MED ORDER — MORPHINE SULFATE (PF) 4 MG/ML IV SOLN
4.0000 mg | Freq: Once | INTRAVENOUS | Status: AC
  Administered 2022-03-19: 4 mg via INTRAVENOUS
  Filled 2022-03-19: qty 1

## 2022-03-19 MED ORDER — ACETAMINOPHEN 650 MG RE SUPP: 650.0000 mg | Freq: Four times a day (QID) | RECTAL | Status: DC | PRN

## 2022-03-19 MED ORDER — ACETAMINOPHEN 325 MG PO TABS
650.0000 mg | ORAL_TABLET | Freq: Four times a day (QID) | ORAL | Status: DC | PRN
  Administered 2022-03-21 – 2022-03-23 (×4): 650 mg via ORAL
  Filled 2022-03-19 (×4): qty 2

## 2022-03-19 MED ORDER — LACTATED RINGERS IV SOLN: INTRAVENOUS | Status: DC

## 2022-03-19 MED ORDER — ONDANSETRON HCL 4 MG/2ML IJ SOLN
4.0000 mg | Freq: Once | INTRAMUSCULAR | Status: AC
  Administered 2022-03-19: 4 mg via INTRAVENOUS
  Filled 2022-03-19: qty 2

## 2022-03-19 MED ORDER — LACTATED RINGERS IV BOLUS (SEPSIS)
500.0000 mL | Freq: Once | INTRAVENOUS | Status: AC
  Administered 2022-03-19: 500 mL via INTRAVENOUS

## 2022-03-19 MED ORDER — SODIUM CHLORIDE 0.9 % IV SOLN
2.0000 g | Freq: Once | INTRAVENOUS | Status: AC
  Administered 2022-03-19: 2 g via INTRAVENOUS
  Filled 2022-03-19: qty 12.5

## 2022-03-19 NOTE — Progress Notes (Signed)
A consult was received from an ED physician for Vancomycin + Cefepime per pharmacy dosing.  The patient's profile has been reviewed for ht/wt/allergies/indication/available labs.   A one time order has been placed for Vancomycin 2gm + Cefepime 2gm IV.   -Check MRSA PCR.  Further antibiotics/pharmacy consults should be ordered by admitting physician if indicated.                       Thank you, Netta Cedars PharmD 03/19/2022  11:02 PM

## 2022-03-19 NOTE — ED Provider Notes (Signed)
[facility] DEPT Provider Note   CSN: [id number] Arrival date & time: 03/19/22  1406     History  Chief Complaint  Patient presents with   Fall    [name] is a 71 y.o. male.  Pt is a complex pt with hx of DM2, HTN, diastolic CHF with preserved EF but recent hospitalization with soft pressures and AKI with discontinuation of entresto and halved carvedilol and held torsemide, autoimmune myopathy/myositis, hypothyroidism, PE/DVT s/p thrombectomy of RLE DVT in march 2023, and CKD stage III, who was most recently discharged from the hospital on 03/15/2022 after having encephalopathy, RSV positive with concern for bronchopneumonia, CHF exacerbation as well as exacerbation of his myositis with elevated CK levels and increase in his chronic prednisone who has been seen in the emergency room now 2 times since discharge on the 16th.  Both due to falls.  He has had multiple falls since going home per his wife.  His legs are becoming more swollen and he is not lifting them up or they are just giving out on him.  Today this happened and he fell hitting his head on the sink and his back on the table.  He denies any loss of consciousness but does take Eliquis.  He complains of generalized pain all over and his wife reports he has been feeling hot.  He continues to have an ongoing cough which was present at discharge but it is getting worse.  Patient is complaining of pain all over including chest pain is not specifically complaining of shortness of breath at this time.  Denies any urinary symptoms.  No nausea or vomiting.  They have been dealing with repeated episodes of low blood pressure at home intermittently but were going to start the torsemide today but had not.  Also patient takes tramadol twice daily and has not had any in the last 48 hours.  The history is provided by the spouse, the patient and medical records.  Fall       Home Medications Prior to Admission  medications   Medication Sig Start Date End Date Taking? Authorizing Provider  acarbose (PRECOSE) 100 MG tablet Take 100 mg by mouth 3 (three) times daily with meals.    [provider]  acetaminophen (TYLENOL) 500 MG tablet See admin instructions. Take (1,000 mg) by mouth in the morning and (500 mg) by mouth at bedtime    [provider]  apixaban (ELIQUIS) 5 MG TABS tablet Take 1 tablet (5 mg total) by mouth 2 (two) times daily. 08/14/21   Loel Dubonnet, NP  atorvastatin (LIPITOR) 80 MG tablet Take 1 tablet (80 mg total) by mouth daily. Please attend scheduled appointment for additional refills. Patient taking differently: Take 80 mg by mouth every evening. Please attend scheduled appointment for additional refills. 03/02/22   Hilty, Nadean Corwin, MD  azithromycin (ZITHROMAX) 500 MG tablet Take 1 tablet (500 mg total) by mouth daily. 03/16/22   Elgergawy, Silver Huguenin, MD  baclofen (LIORESAL) 20 MG tablet TAKE 1 TABLET BY MOUTH TWICE A DAY 01/30/22   [name], DO  calcium carbonate (OS-CAL) 600 MG TABS tablet Take 600 mg by mouth daily.    [provider]  carvedilol (COREG) 6.25 MG tablet Take 1 tablet (6.25 mg total) by mouth 2 (two) times daily. 03/15/22   Elgergawy, Silver Huguenin, MD  cefdinir (OMNICEF) 300 MG capsule Take 1 capsule (300 mg total) by mouth 2 (two) times daily. 03/16/22   Elgergawy, Emeline Gins  S, MD  Continuous Blood Gluc Sensor (FREESTYLE LIBRE 2 SENSOR) MISC Use to check glucose as needed.  Change every 14 days. Patient not taking: Reported on 01/30/2022 11/14/21   [name], DO  folic acid (FOLVITE) 1 MG tablet Take 2 mg by mouth daily.    [provider]  glipiZIDE (GLUCOTROL XL) 10 MG 24 hr tablet Take 20 mg by mouth daily.    [provider]  insulin glargine (LANTUS) 100 UNIT/ML injection Inject 0.46-0.56 mLs (46-56 Units total) into the skin at bedtime. For now take 30 units twice a day and then increase to your usual dose as your  oral intake improves. Increase both doses by 2units every day. Check your blood sugars at home three times daily. Call your doctor if sugar level is more than 300 or less than 80. Patient taking differently: Inject 42-44 Units into the skin 2 (two) times daily. Check your blood sugars at home three times daily. Call your doctor if sugar level is more than 300 or less than 80.  Take 44 units subcutaneously in the morning and 42 units subcutaneously at night. 10/01/14   Bonnielee Haff, MD  ipratropium-albuterol (DUONEB) 0.5-2.5 (3) MG/3ML SOLN Use 1 vial (54m) by nebulization 4 (four) times daily. 03/15/22   Elgergawy, DSilver Huguenin MD  Levothyroxine Sodium 75 MCG CAPS Take 75 mcg by mouth daily.    [provider]  loratadine (CLARITIN) 10 MG tablet Take 10 mg by mouth daily. 12/12/21   [provider]  metFORMIN (GLUCOPHAGE-XR) 500 MG 24 hr tablet Take by mouth See admin instructions. Take (1,000 mg) by mouth in the morning and (500 mg) by mouth at night. 07/24/21   [provider]  methotrexate (RHEUMATREX) 5 MG tablet Take 2 tablets (10 mg total) by mouth once a week. Caution: Chemotherapy. Protect from light. Take on fridays per patient.  OKAY TO RESUME FROM 10/08/14. Patient taking differently: Take 30 mg by mouth once a week. Caution: Chemotherapy. Protect from light. Take on fridays per patient.  6 Tabs once a week 10/01/14   KBonnielee Haff MD  Methylphenidate HCl (Charlaine DaltonER PO) Take by mouth. Dosing Unknown    [provider]  nystatin (MYCOSTATIN/NYSTOP) powder Apply 1 Application topically 3 (three) times daily. 05/08/21   [provider]  ORoma Schanztest strip SMARTSIG:Via Meter 02/06/22   [provider]  pantoprazole (PROTONIX) 40 MG tablet Take 1 tablet (40 mg total) by mouth as needed. 01/09/22   WLoel Dubonnet NP  polyvinyl alcohol (LIQUIFILM TEARS) 1.4 % ophthalmic solution 1 drop as needed for dry eyes.    [provider]  predniSONE (DELTASONE) 5 MG tablet Please take 9 tablets ('45mg'$ ) by mouth once daily for 2 days, then 6 tablets ('30mg'$ ) once daily for 2 days, then back to 15 mg oral daily. 03/15/22   Elgergawy, DSilver Huguenin MD  PRESCRIPTION MEDICATION [facility] -- Unknown Name -- Unknown Dosing    [provider]  QUEtiapine (SEROQUEL) 25 MG tablet Take 1 tablet (25 mg total) by mouth at bedtime. 02/13/22   MLuetta Nutting DO  rOPINIRole (REQUIP) 1 MG tablet Take 2 tablets (2 mg total) by mouth at bedtime. 03/15/22   Elgergawy, DSilver Huguenin MD  spironolactone (ALDACTONE) 25 MG tablet Take 0.5 tablets (12.5 mg total) by mouth daily. 10/09/21 10/04/22  WLoel Dubonnet NP  torsemide (DEMADEX) 10 MG tablet Take 1 tablet (10 mg total) by mouth daily. 03/16/22   Elgergawy,  Silver Huguenin, MD  traMADol (ULTRAM) 50 MG tablet See admin instructions. Take one tablet (50 mg) by mouth in the morning and half tablet (25 mg) by mouth at night. 08/04/19   [provider]  triamcinolone cream (KENALOG) 0.1 % Use 1 application twice daily as needed to red itchy areas below the face 09/30/18   Kennith Gain, MD  vitamin B-12 (CYANOCOBALAMIN) 100 MCG tablet Take 100 mcg by mouth daily.    [provider]      Allergies    Benazepril, Gabapentin, Hydrocodone, and Shellfish allergy    Review of Systems   Review of Systems  Physical Exam Updated Vital Signs BP (!) 159/86   Pulse (!) 103   Temp 100.2 F (37.9 C) (Rectal)   Resp 20   SpO2 100%  Physical Exam Vitals and nursing note reviewed.  Constitutional:      General: He is in acute distress.     Appearance: He is well-developed. He is ill-appearing.  HENT:     Head: Normocephalic and atraumatic.  Eyes:     Conjunctiva/sclera: Conjunctivae normal.     Pupils: Pupils are equal, round, and reactive to light.  Cardiovascular:     Rate and Rhythm: Regular rhythm. Tachycardia present.     Heart sounds: No murmur heard. Pulmonary:      Effort: Pulmonary effort is normal. No respiratory distress.     Breath sounds: Decreased air movement present. Examination of the right-lower field reveals decreased breath sounds. Examination of the left-lower field reveals decreased breath sounds. Decreased breath sounds present. No wheezing or rales.  Abdominal:     General: There is no distension.     Palpations: Abdomen is soft.     Tenderness: There is no abdominal tenderness. There is no guarding or rebound.  Musculoskeletal:        General: No tenderness. Normal range of motion.     Cervical back: Normal range of motion and neck supple.     Right lower leg: Edema present.     Left lower leg: Edema present.     Comments: 4+ pitting edema in the right lower extremity and 2+ pitting edema in the left lower extremity.  No skin changes, erythema or induration.  Ecchymosis in various stages of healing over the upper and lower extremities.  Tenderness to the touch over all parts of his extremities.  Skin:    General: Skin is warm and dry.     Findings: No erythema or rash.     Comments: Patient is hot to the touch and face is flushed  Neurological:     Mental Status: He is alert and oriented to person, place, and time. Mental status is at baseline.  Psychiatric:        Behavior: Behavior normal.     ED Results / Procedures / Treatments   Labs (all labs ordered are listed, but only abnormal results are displayed) Labs Reviewed  CBC WITH DIFFERENTIAL/PLATELET - Abnormal; Notable for the following components:      Result Value   RBC 3.28 (*)    Hemoglobin 10.4 (*)    HCT 32.4 (*)    nRBC 1.2 (*)    Abs Immature Granulocytes 0.09 (*)    All other components within normal limits  COMPREHENSIVE METABOLIC PANEL - Abnormal; Notable for the following components:   BUN 39 (*)    Creatinine, Ser 1.64 (*)    GFR, Estimated 44 (*)    All other components  within normal limits  LACTIC ACID, PLASMA - Abnormal; Notable for the following  components:   Lactic Acid, Venous 3.5 (*)    All other components within normal limits  CBG MONITORING, ED - Abnormal; Notable for the following components:   Glucose-Capillary 69 (*)    All other components within normal limits  CBG MONITORING, ED - Abnormal; Notable for the following components:   Glucose-Capillary 127 (*)    All other components within normal limits  CBG MONITORING, ED - Abnormal; Notable for the following components:   Glucose-Capillary 150 (*)    All other components within normal limits  CBG MONITORING, ED - Abnormal; Notable for the following components:   Glucose-Capillary 150 (*)    All other components within normal limits  TROPONIN I (HIGH SENSITIVITY) - Abnormal; Notable for the following components:   Troponin I (High Sensitivity) 27 (*)    All other components within normal limits  CULTURE, BLOOD (ROUTINE X 2)  CULTURE, BLOOD (ROUTINE X 2)  CK  LACTIC ACID, PLASMA  BRAIN NATRIURETIC PEPTIDE    EKG EKG Interpretation  Date/Time:  Monday March 19 2022 21:14:41 EST Ventricular Rate:  109 PR Interval:  141 QRS Duration: 86 QT Interval:  324 QTC Calculation: 437 R Axis:   10 Text Interpretation: Sinus tachycardia Abnormal R-wave progression, early transition No significant change since last tracing Confirmed by Blanchie Dessert [phone]) on 03/19/2022 9:37:28 PM  Radiology CT Lumbar Spine Wo Contrast  Result Date: 03/19/2022 CLINICAL DATA:  Back trauma, no prior imaging (Age >= 16y) EXAM: CT LUMBAR SPINE WITHOUT CONTRAST TECHNIQUE: Multidetector CT imaging of the lumbar spine was performed without intravenous contrast administration. Multiplanar CT image reconstructions were also generated. RADIATION DOSE REDUCTION: This exam was performed according to the departmental dose-optimization program which includes automated exposure control, adjustment of the mA and/or kV according to patient size and/or use of iterative reconstruction technique.  COMPARISON:  MRI lumbar spine 12/05/2007 FINDINGS: Segmentation: 5 lumbar type vertebrae. Alignment: Normal. Vertebrae: Lower lumbar spine at least moderate degenerative changes. Mild multilevel osteophyte formation of the lumbar spine. L5-S1 mild posterior disc osteophyte complex formation. No severe osseous neural foraminal or central canal stenosis. No acute fracture or focal pathologic process. Paraspinal and other soft tissues: Negative. Disc levels: Intervertebral disc space vacuum phenomenon at the L5-S1 level. Other: Atherosclerotic plaque. IMPRESSION: 1. No acute displaced fracture or traumatic listhesis of the lumbar spine. 2.  Aortic Atherosclerosis (ICD10-I70.0). Electronically Signed   By: Iven Finn M.D.   On: 03/19/2022 16:25   CT Head Wo Contrast  Result Date: 03/19/2022 CLINICAL DATA:  Head trauma, minor (Age >= 65y); Neck trauma (Age >= 65y) EXAM: CT HEAD WITHOUT CONTRAST CT CERVICAL SPINE WITHOUT CONTRAST TECHNIQUE: Multidetector CT imaging of the head and cervical spine was performed following the standard protocol without intravenous contrast. Multiplanar CT image reconstructions of the cervical spine were also generated. RADIATION DOSE REDUCTION: This exam was performed according to the departmental dose-optimization program which includes automated exposure control, adjustment of the mA and/or kV according to patient size and/or use of iterative reconstruction technique. COMPARISON:  CT head 03/11/2022 FINDINGS: CT HEAD FINDINGS BRAIN: BRAIN prominence of the lateral ventricles may be related to central predominant atrophy, although a component of normal pressure/communicating hydrocephalus cannot be excluded. Patchy and confluent areas of decreased attenuation are noted throughout the deep and periventricular white matter of the cerebral hemispheres bilaterally, compatible with chronic microvascular ischemic disease. No evidence of large-territorial acute infarction. No parenchymal  hemorrhage. No mass lesion. No extra-axial collection. No mass effect or midline shift. No hydrocephalus. Basilar cisterns are patent. Vascular: No hyperdense vessel. Skull: No acute fracture or focal lesion. Sinuses/Orbits: Bilateral maxillary, bilateral ethmoid, bilateral frontal mucosal thickening. Remaining visualized paranasal sinuses and mastoid air cells are clear. The orbits are unremarkable. Other: None. CT CERVICAL SPINE FINDINGS Alignment: Normal. Skull base and vertebrae: Multilevel moderate degenerative changes of the spine with associated moderate severe osseous neural foraminal stenosis bilaterally at the C5-C6 level. No severe osseous central canal stenosis. No acute fracture. No aggressive appearing focal osseous lesion or focal pathologic process. Soft tissues and spinal canal: No prevertebral fluid or swelling. No visible canal hematoma. Upper chest: Unremarkable. Other: None. IMPRESSION: 1. No acute intracranial abnormality. 2. No acute displaced fracture or traumatic listhesis of the cervical spine. 3. Sinus disease. Electronically Signed   By: Iven Finn M.D.   On: 03/19/2022 16:23   CT Cervical Spine Wo Contrast  Result Date: 03/19/2022 CLINICAL DATA:  Head trauma, minor (Age >= 65y); Neck trauma (Age >= 65y) EXAM: CT HEAD WITHOUT CONTRAST CT CERVICAL SPINE WITHOUT CONTRAST TECHNIQUE: Multidetector CT imaging of the head and cervical spine was performed following the standard protocol without intravenous contrast. Multiplanar CT image reconstructions of the cervical spine were also generated. RADIATION DOSE REDUCTION: This exam was performed according to the departmental dose-optimization program which includes automated exposure control, adjustment of the mA and/or kV according to patient size and/or use of iterative reconstruction technique. COMPARISON:  CT head 03/11/2022 FINDINGS: CT HEAD FINDINGS BRAIN: BRAIN prominence of the lateral ventricles may be related to central  predominant atrophy, although a component of normal pressure/communicating hydrocephalus cannot be excluded. Patchy and confluent areas of decreased attenuation are noted throughout the deep and periventricular white matter of the cerebral hemispheres bilaterally, compatible with chronic microvascular ischemic disease. No evidence of large-territorial acute infarction. No parenchymal hemorrhage. No mass lesion. No extra-axial collection. No mass effect or midline shift. No hydrocephalus. Basilar cisterns are patent. Vascular: No hyperdense vessel. Skull: No acute fracture or focal lesion. Sinuses/Orbits: Bilateral maxillary, bilateral ethmoid, bilateral frontal mucosal thickening. Remaining visualized paranasal sinuses and mastoid air cells are clear. The orbits are unremarkable. Other: None. CT CERVICAL SPINE FINDINGS Alignment: Normal. Skull base and vertebrae: Multilevel moderate degenerative changes of the spine with associated moderate severe osseous neural foraminal stenosis bilaterally at the C5-C6 level. No severe osseous central canal stenosis. No acute fracture. No aggressive appearing focal osseous lesion or focal pathologic process. Soft tissues and spinal canal: No prevertebral fluid or swelling. No visible canal hematoma. Upper chest: Unremarkable. Other: None. IMPRESSION: 1. No acute intracranial abnormality. 2. No acute displaced fracture or traumatic listhesis of the cervical spine. 3. Sinus disease. Electronically Signed   By: Iven Finn M.D.   On: 03/19/2022 16:23   DG Chest 2 View  Result Date: 03/19/2022 CLINICAL DATA:  Evaluate for pneumonia, recent fall EXAM: CHEST - 2 VIEW COMPARISON:  03/16/2022 FINDINGS: Cardiac size is within normal limits. There is poor inspiration. There are no signs of pulmonary edema or new focal infiltrates. There is no pleural effusion or pneumothorax. IMPRESSION: No active cardiopulmonary disease. Electronically Signed   By: Elmer Picker M.D.   On:  03/19/2022 16:13    Procedures Procedures    Medications Ordered in ED Medications  HYDROmorphone (DILAUDID) injection 0.5 mg (has no administration in time range)  lactated ringers infusion (has no administration in time range)  lactated ringers bolus 500 mL (  has no administration in time range)  vancomycin (VANCOCIN) IVPB 1000 mg/200 mL premix (has no administration in time range)  ceFEPIme (MAXIPIME) 2 g in sodium chloride 0.9 % 100 mL IVPB (has no administration in time range)  morphine (PF) 4 MG/ML injection 4 mg (4 mg Intravenous Given 03/19/22 2219)  ondansetron (ZOFRAN) injection 4 mg (4 mg Intravenous Given 03/19/22 2220)  acetaminophen (TYLENOL) tablet 1,000 mg (1,000 mg Oral Given 03/19/22 2226)    ED Course/ Medical Decision Making/ A&P                           Medical Decision Making Amount and/or Complexity of Data Reviewed Independent Historian: spouse External Data Reviewed: notes.    Details: Recent hospitalization Labs: ordered. Decision-making details documented in ED Course. Radiology: ordered and independent interpretation performed. Decision-making details documented in ED Course. ECG/medicine tests: ordered and independent interpretation performed. Decision-making details documented in ED Course.  Risk OTC drugs. Prescription drug management. Decision regarding hospitalization.   Pt with multiple medical problems and comorbidities and presenting today with a complaint that caries a high risk for morbidity and mortality.  Here today with multiple complaints.  Patient had he had another fall after going home from the hospital on 1116.  It seems that he is falling because of the significant swelling in his legs which has been gradually getting worse.  Also patient is having a persistent cough on exam but oxygen saturation is within normal limits on room air.  Patient has significant findings for fluid overload with significant swelling in his lower extremities  today.  Patient on rectal temperature here has a fever of 100.2.  He does have prior history of PE and DVT and is currently on Eliquis for this and has not missed any doses.  He has had to have a thrombectomy in the past.  Patient is also immunocompromised because he is on chronic steroids and is at an increased dose recently due to a flare of his myositis.  I independently interpreted patient's labs and EKG.  EKG was sinus tachycardia but no acute ST changes, CBC with stable hemoglobin normal white count, CMP with creatinine of 1.64 which is mildly elevated from his baseline at 1.3, normal anion gap and electrolytes, CK to day is normal at 364.  Initially blood sugar was low at 69 but improved with eating something and has been in the 100s since that time.  I have independently visualized and interpreted pt's images today.  Head CT without acute findings today no evidence of intracranial hemorrhage, cervical spine and lumbar spine CT is negative for acute fracture, chest x-ray without new findings. With patient's worsening fluid overload, low-grade fever today and recurrent falls did not feel that he is stable for discharge home.  Possibly patient's fever is related to ongoing RSV but may have developing pneumonia.  Also feel that patient needs to be diuresed.  Patient was given pain medication here and Tylenol.  10:44 PM Lactic acid is elevated at 3.5 troponin is not significantly changed at 27.  Feel that patient is fluid overloaded but intravascularly depleted and with fever concern for sepsis.  Code sepsis was initiated.  Patient covered with cefepime and vancomycin given recent hospitalization and he completed a course of azithromycin and cephalosporin at home after discharge.  Will admit to the hospital for further care.  Hospitalist consulted for admission.           Final Clinical  Impression(s) / ED Diagnoses Final diagnoses:  Fall, initial encounter  Sepsis with acute renal failure without  septic shock, due to unspecified organism, unspecified acute renal failure type ([place])  Bilateral lower extremity edema    Rx / DC Orders ED Discharge Orders     None         Blanchie Dessert, MD 03/19/22 2245

## 2022-03-19 NOTE — Sepsis Progress Note (Signed)
Following per sepsis protocol   

## 2022-03-19 NOTE — ED Triage Notes (Addendum)
Pt here today with wife who says he's been c/o bodyaches x 2 days. Hx of myopathy and has not had tramadol. States also had fall in bathroom this am around 430am. Wife states she think he may have hit his head on the sink. Is also c/o back pain. Was recently in hospital for [place]. low BP readings. All pt has had today was a banana and some orange juice.

## 2022-03-19 NOTE — ED Provider Notes (Signed)
[name]    CSN: [id number] Arrival date & time: 03/19/22  1236      History   Chief Complaint Chief Complaint  Patient presents with   Generalized Body Aches    HPI [name] is a 71 y.o. male with a history of myopathy and currently anticoagulated is brought to the urgent care by family on account of a fall which occurred earlier today.  Patient apparently hit his head against a sink when he fell.  Fall was unwitnessed and from patient's account it appears that he did not lose consciousness.  In the urgent care patient is complaining of generalized body aches particularly left-sided lower back pain.  He denies any dizziness.  No account of nausea or vomiting.  And is currently complaining of generalized body aches.  He seems to be coherent.  Patient was recently discharged from the hospital.  He was on admission for RSV pneumonia with altered mentation.  Patient had to go back to the emergency room after discharge because of a fall at home.  During the hospital stay patient had hypotension resulting in adjustments of antihypertensive agents.  HPI  Past Medical History:  Diagnosis Date   Autoimmune disease ([place])    Bronchitis    Chronic hiccups    Diabetes mellitus without complication (HCC)    GERD (gastroesophageal reflux disease)    Hypothyroidism    Joint pain    Necrotizing myopathy    PTSD (post-traumatic stress disorder)     Patient Active Problem List   Diagnosis Date Noted   Acute metabolic encephalopathy 17/61/6073   RSV (respiratory syncytial virus pneumonia) 03/11/2022   Sinusitis, acute frontal 71/09/2692   Diastolic congestive heart failure ([place]) 03/11/2022   CKD (chronic kidney disease), stage III ([place]) 03/11/2022   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   History of DVT (deep vein thrombosis) 03/11/2022   History of pulmonary embolism 03/11/2022   Hyperlipidemia 03/11/2022   Obesity (BMI 30-39.9) 03/11/2022   CAD (coronary artery disease)  01/30/2022   Depression 01/30/2022   Cardiomyopathy ([place]) 01/30/2022   Posttraumatic stress disorder 01/30/2022   Rheumatoid arthritis ([place]) 08/09/2021   New onset of headaches after age 56 08/09/2021   Non-ST elevation (NSTEMI) myocardial infarction [facility])    Acute combined systolic and diastolic heart failure ([place])    Acute pulmonary embolism ([place]) 07/22/2021   Acute DVT (deep venous thrombosis) ([place]) 07/22/2021   Acute kidney injury superimposed on CKD stage3a  07/22/2021   Lactic acidosis 07/22/2021   Elevated troponin 07/22/2021   Acute respiratory failure with hypoxia (HCC) 07/22/2021   Anxiety 07/22/2021   Ankle swelling 01/17/2020   Fatigue 12/12/2018   GERD (gastroesophageal reflux disease) 06/04/2018   Hypogonadism in male 04/01/2018   Macrocytic anemia 09/30/2017   Itching 09/30/2017   Hypothyroidism 09/30/2017   Low testosterone 09/30/2017   Benign prostatic hyperplasia without lower urinary tract symptoms 09/30/2017   Erectile dysfunction 03/19/2016   Benign essential HTN 08/08/2015   Branch retinal vein occlusion with macular edema of left eye 03/25/2015   Nuclear sclerotic cataract of both eyes 03/25/2015   Myositis 09/28/2014   DM2 (diabetes mellitus, type 2) ([place]) 09/28/2014   Abnormal glucose 10/04/2012    Past Surgical History:  Procedure Laterality Date   BIOPSY SHOULDER     Left   IR ANGIOGRAM PULMONARY BILATERAL SELECTIVE  07/22/2021   IR THROMBECT VENO MECH MOD SED  07/22/2021   IR US GUIDE VASC ACCESS LEFT  07/22/2021   RADIOLOGY WITH ANESTHESIA  Right 07/22/2021   Procedure: IR WITH ANESTHESIA;  Surgeon: Radiologist, Medication, MD;  Location: [place];  Service: Radiology;  Laterality: Right;   UPPER LEG SOFT TISSUE BIOPSY Right        Home Medications    Prior to Admission medications   Medication Sig Start Date End Date Taking? Authorizing Provider  acarbose (PRECOSE) 100 MG tablet Take 100 mg by mouth 3 (three) times daily with meals.     [provider]  acetaminophen (TYLENOL) 500 MG tablet See admin instructions. Take (1,000 mg) by mouth in the morning and (500 mg) by mouth at bedtime    [provider]  apixaban (ELIQUIS) 5 MG TABS tablet Take 1 tablet (5 mg total) by mouth 2 (two) times daily. 08/14/21   Loel Dubonnet, NP  atorvastatin (LIPITOR) 80 MG tablet Take 1 tablet (80 mg total) by mouth daily. Please attend scheduled appointment for additional refills. Patient taking differently: Take 80 mg by mouth every evening. Please attend scheduled appointment for additional refills. 03/02/22   Hilty, Nadean Corwin, MD  azithromycin (ZITHROMAX) 500 MG tablet Take 1 tablet (500 mg total) by mouth daily. 03/16/22   Elgergawy, Silver Huguenin, MD  baclofen (LIORESAL) 20 MG tablet TAKE 1 TABLET BY MOUTH TWICE A DAY 01/30/22   [name], DO  calcium carbonate (OS-CAL) 600 MG TABS tablet Take 600 mg by mouth daily.    [provider]  carvedilol (COREG) 6.25 MG tablet Take 1 tablet (6.25 mg total) by mouth 2 (two) times daily. 03/15/22   Elgergawy, Silver Huguenin, MD  cefdinir (OMNICEF) 300 MG capsule Take 1 capsule (300 mg total) by mouth 2 (two) times daily. 03/16/22   Elgergawy, Silver Huguenin, MD  Continuous Blood Gluc Sensor (FREESTYLE LIBRE 2 SENSOR) MISC Use to check glucose as needed.  Change every 14 days. Patient not taking: Reported on 01/30/2022 11/14/21   [name], DO  folic acid (FOLVITE) 1 MG tablet Take 2 mg by mouth daily.    [provider]  glipiZIDE (GLUCOTROL XL) 10 MG 24 hr tablet Take 20 mg by mouth daily.    [provider]  insulin glargine (LANTUS) 100 UNIT/ML injection Inject 0.46-0.56 mLs (46-56 Units total) into the skin at bedtime. For now take 30 units twice a day and then increase to your usual dose as your oral intake improves. Increase both doses by 2units every day. Check your blood sugars at home three times daily. Call your doctor if sugar level is more than 300 or less  than 80. Patient taking differently: Inject 42-44 Units into the skin 2 (two) times daily. Check your blood sugars at home three times daily. Call your doctor if sugar level is more than 300 or less than 80.  Take 44 units subcutaneously in the morning and 42 units subcutaneously at night. 10/01/14   Bonnielee Haff, MD  ipratropium-albuterol (DUONEB) 0.5-2.5 (3) MG/3ML SOLN Use 1 vial (61m) by nebulization 4 (four) times daily. 03/15/22   Elgergawy, DSilver Huguenin MD  Levothyroxine Sodium 75 MCG CAPS Take 75 mcg by mouth daily.    [provider]  loratadine (CLARITIN) 10 MG tablet Take 10 mg by mouth daily. 12/12/21   [provider]  metFORMIN (GLUCOPHAGE-XR) 500 MG 24 hr tablet Take by mouth See admin instructions. Take (1,000 mg) by mouth in the morning and (500 mg) by mouth at night. 07/24/21   [provider]  methotrexate (RHEUMATREX) 5 MG tablet Take 2 tablets (10 mg  total) by mouth once a week. Caution: Chemotherapy. Protect from light. Take on fridays per patient.  OKAY TO RESUME FROM 10/08/14. Patient taking differently: Take 30 mg by mouth once a week. Caution: Chemotherapy. Protect from light. Take on fridays per patient.  6 Tabs once a week 10/01/14   Bonnielee Haff, MD  Methylphenidate HCl [facility] ER PO) Take by mouth. Dosing Unknown    [provider]  nystatin (MYCOSTATIN/NYSTOP) powder Apply 1 Application topically 3 (three) times daily. 05/08/21   [provider]  Roma Schanz test strip SMARTSIG:Via Meter 02/06/22   [provider]  pantoprazole (PROTONIX) 40 MG tablet Take 1 tablet (40 mg total) by mouth as needed. 01/09/22   Loel Dubonnet, NP  polyvinyl alcohol (LIQUIFILM TEARS) 1.4 % ophthalmic solution 1 drop as needed for dry eyes.    [provider]  predniSONE (DELTASONE) 5 MG tablet Please take 9 tablets ('45mg'$ ) by mouth once daily for 2 days, then 6 tablets ('30mg'$ ) once daily for 2 days, then back to 15 mg oral  daily. 03/15/22   Elgergawy, Silver Huguenin, MD  PRESCRIPTION MEDICATION [facility] -- Unknown Name -- Unknown Dosing    [provider]  QUEtiapine (SEROQUEL) 25 MG tablet Take 1 tablet (25 mg total) by mouth at bedtime. 02/13/22   [name], DO  rOPINIRole (REQUIP) 1 MG tablet Take 2 tablets (2 mg total) by mouth at bedtime. 03/15/22   Elgergawy, Silver Huguenin, MD  spironolactone (ALDACTONE) 25 MG tablet Take 0.5 tablets (12.5 mg total) by mouth daily. 10/09/21 10/04/22  Loel Dubonnet, NP  torsemide (DEMADEX) 10 MG tablet Take 1 tablet (10 mg total) by mouth daily. 03/16/22   Elgergawy, Silver Huguenin, MD  traMADol (ULTRAM) 50 MG tablet See admin instructions. Take one tablet (50 mg) by mouth in the morning and half tablet (25 mg) by mouth at night. 08/04/19   [provider]  triamcinolone cream (KENALOG) 0.1 % Use 1 application twice daily as needed to red itchy areas below the face 09/30/18   Kennith Gain, MD  vitamin B-12 (CYANOCOBALAMIN) 100 MCG tablet Take 100 mcg by mouth daily.    [provider]    Family History Family History  Problem Relation Age of Onset   Hypertension Mother    Diabetes Mother    Arthritis Mother    Depression Mother    Heart attack Father    Hypertension Father     Social History Social History   Tobacco Use   Smoking status: Never   Smokeless tobacco: Never  Vaping Use   Vaping Use: Never used  Substance Use Topics   Alcohol use: No   Drug use: No     Allergies   Benazepril, Gabapentin, Hydrocodone, and Shellfish allergy   Review of Systems Review of Systems  Unable to perform ROS: Acuity of condition     Physical Exam Triage Vital Signs ED Triage Vitals [03/19/22 1255]  Enc Vitals Group     BP (!) 146/78     Pulse Rate (!) 109     Resp 19     Temp 98.8 F (37.1 C)     Temp Source Oral     SpO2 99 %     Weight      Height      Head Circumference      Peak Flow      Pain Score      Pain  Loc  Pain Edu?      Excl. in [place]?    No data found.  Updated Vital Signs BP (!) 146/78 (BP Location: Right Arm)   Pulse (!) 109   Temp 98.8 F (37.1 C) (Oral)   Resp 19   SpO2 99%   Visual Acuity Right Eye Distance:   Left Eye Distance:   Bilateral Distance:    Right Eye Near:   Left Eye Near:    Bilateral Near:     Physical Exam Vitals and nursing note reviewed.  Constitutional:      General: He is in acute distress.  HENT:     Head: Normocephalic and atraumatic.     Nose: Nose normal.  Musculoskeletal:        General: Normal range of motion.  Skin:    General: Skin is warm.  Neurological:     Mental Status: He is alert and oriented to person, place, and time.      UC Treatments / Results  Labs (all labs ordered are listed, but only abnormal results are displayed) Labs Reviewed - No data to display  EKG   Radiology No results found.  Procedures Procedures (including critical care time)  Medications Ordered in UC Medications - No data to display  Initial Impression / Assessment and Plan / UC Course  I have reviewed the triage vital signs and the nursing notes.  Pertinent labs & imaging results that were available during my care of the patient were reviewed by me and considered in my medical decision making (see chart for details).     1.  Fall at home in anticoagulated patient: Patient was accompanied by family.  They were advised to go to the emergency department given the complexity of this patient's medical history.  Patient is currently restless and will need imaging to evaluate for any musculoskeletal injury.  He will also need pain control.  I advised patient family to take him to the emergency department.  They agreed to take him to the emergency department. Final Clinical Impressions(s) / UC Diagnoses   Final diagnoses:  Fall at home, initial encounter     Discharge Instructions      Please go to the emergency department for  further evaluation.  I would like you to be evaluated in the emergency department because you are on blood thinners and you had a fall at home today.  You remain very uncomfortable/restless here in the urgent care.     ED Prescriptions   None    PDMP not reviewed this encounter.   Chase Picket, MD 03/19/22 1401

## 2022-03-19 NOTE — Telephone Encounter (Signed)
Transition Care Management Unsuccessful Follow-up Telephone Call  Date of discharge and from where:  03/16/22 from [facility]  Attempts:  2nd Attempt  Reason for unsuccessful TCM follow-up call:  Left voice message

## 2022-03-19 NOTE — Discharge Instructions (Signed)
Please go to the emergency department for further evaluation.  I would like you to be evaluated in the emergency department because you are on blood thinners and you had a fall at home today.  You remain very uncomfortable/restless here in the urgent care.

## 2022-03-19 NOTE — ED Notes (Signed)
Patient is being discharged from the Urgent Care and sent to the Emergency Department via POV driven by wife. Per Dr Lanny Cramp, patient is in need of higher level of care due to fall and possibly hitting head. Patient is aware and verbalizes understanding of plan of care.  Vitals:   03/19/22 1255  BP: (!) 146/78  Pulse: (!) 109  Resp: 19  Temp: 98.8 F (37.1 C)  SpO2: 99%

## 2022-03-19 NOTE — ED Notes (Signed)
ED Provider at bedside to discuss plan of care with pt spouse

## 2022-03-19 NOTE — ED Triage Notes (Incomplete)
Patient c/o fall in the bathroom this morning. Family report hitting his head in the vanity. Family found patient in the floor. Pt on blood thinners. Pt c/o head and back ache.

## 2022-03-19 NOTE — ED Provider Triage Note (Signed)
Emergency Medicine Provider Triage Evaluation Note  [name] , a 71 y.o. male  was evaluated in triage.  Pt complains of headache and low back pain secondary to a fall.  Patient is supposed to ambulate with a walker and walk to the bathroom without utilizing the walker, falling while turning on the lights.  Family states they heard a "tink" sound which sounded like the patient hit his head on the sink. Patient also endorses low back pain. Patient has autoimmune myositis and is being weaned on his Tramadol dose. Patient currently endorses pain mostly from muscle pain unrelated to the fall. Patient also recently diagnosed with pneumonia and admitted to the hospital in early November. Denies shortness of breath at this time  Review of Systems  Positive: As above Negative: As above  Physical Exam  BP 116/79   Pulse (!) 103   Temp 98.9 F (37.2 C) (Oral)   Resp 20   SpO2 100%  Gen:   Awake, no distress   Resp:  Normal effort  MSK:   Moves extremities without difficulty  Other:  Mid-line lumbar spine tenderness  Medical Decision Making  Medically screening exam initiated at 3:29 PM.  Appropriate orders placed.  [name] was informed that the remainder of the evaluation will be completed by another provider, this initial triage assessment does not replace that evaluation, and the importance of remaining in the ED until their evaluation is complete.     Dorothyann Peng, PA-C 03/19/22 1531

## 2022-03-20 ENCOUNTER — Encounter (HOSPITAL_COMMUNITY): Payer: Self-pay | Admitting: Internal Medicine

## 2022-03-20 ENCOUNTER — Ambulatory Visit (HOSPITAL_BASED_OUTPATIENT_CLINIC_OR_DEPARTMENT_OTHER): Payer: Medicare Other | Admitting: Family

## 2022-03-20 ENCOUNTER — Inpatient Hospital Stay (HOSPITAL_COMMUNITY): Payer: No Typology Code available for payment source

## 2022-03-20 DIAGNOSIS — A419 Sepsis, unspecified organism: Secondary | ICD-10-CM | POA: Insufficient documentation

## 2022-03-20 DIAGNOSIS — R296 Repeated falls: Secondary | ICD-10-CM

## 2022-03-20 DIAGNOSIS — E162 Hypoglycemia, unspecified: Secondary | ICD-10-CM | POA: Diagnosis present

## 2022-03-20 LAB — CBC WITH DIFFERENTIAL/PLATELET
Abs Immature Granulocytes: 0.08 10*3/uL — ABNORMAL HIGH (ref 0.00–0.07)
Basophils Absolute: 0 10*3/uL (ref 0.0–0.1)
Basophils Relative: 0 %
Eosinophils Absolute: 0.1 10*3/uL (ref 0.0–0.5)
Eosinophils Relative: 1 %
HCT: 28.4 % — ABNORMAL LOW (ref 39.0–52.0)
Hemoglobin: 9 g/dL — ABNORMAL LOW (ref 13.0–17.0)
Immature Granulocytes: 1 %
Lymphocytes Relative: 24 %
Lymphs Abs: 2.1 10*3/uL (ref 0.7–4.0)
MCH: 31.4 pg (ref 26.0–34.0)
MCHC: 31.7 g/dL (ref 30.0–36.0)
MCV: 99 fL (ref 80.0–100.0)
Monocytes Absolute: 0.2 10*3/uL (ref 0.1–1.0)
Monocytes Relative: 2 %
Neutro Abs: 6.3 10*3/uL (ref 1.7–7.7)
Neutrophils Relative %: 72 %
Platelets: 230 10*3/uL (ref 150–400)
RBC: 2.87 MIL/uL — ABNORMAL LOW (ref 4.22–5.81)
RDW: 14.5 % (ref 11.5–15.5)
WBC: 8.7 10*3/uL (ref 4.0–10.5)
nRBC: 0.6 % — ABNORMAL HIGH (ref 0.0–0.2)

## 2022-03-20 LAB — BASIC METABOLIC PANEL
Anion gap: 5 (ref 5–15)
BUN: 32 mg/dL — ABNORMAL HIGH (ref 8–23)
CO2: 27 mmol/L (ref 22–32)
Calcium: 9 mg/dL (ref 8.9–10.3)
Chloride: 111 mmol/L (ref 98–111)
Creatinine, Ser: 1.23 mg/dL (ref 0.61–1.24)
GFR, Estimated: >60 mL/min (ref >60)
Glucose, Bld: 53 mg/dL — ABNORMAL LOW (ref 70–99)
Potassium: 5.3 mmol/L — ABNORMAL HIGH (ref 3.5–5.1)
Sodium: 143 mmol/L (ref 135–145)

## 2022-03-20 LAB — HEMOGLOBIN AND HEMATOCRIT, BLOOD
HCT: 27.5 % — ABNORMAL LOW (ref 39.0–52.0)
Hemoglobin: 8.7 g/dL — ABNORMAL LOW (ref 13.0–17.0)

## 2022-03-20 LAB — LACTIC ACID, PLASMA
Lactic Acid, Venous: 2.7 mmol/L (ref 0.5–1.9)
Lactic Acid, Venous: 2.6 mmol/L (ref 0.5–1.9)
Lactic Acid, Venous: 1.3 mmol/L (ref 0.5–1.9)

## 2022-03-20 LAB — PHOSPHORUS: Phosphorus: 4 mg/dL (ref 2.5–4.6)

## 2022-03-20 LAB — MRSA NEXT GEN BY PCR, NASAL: MRSA by PCR Next Gen: NOT DETECTED

## 2022-03-20 LAB — CBG MONITORING, ED
Glucose-Capillary: 72 mg/dL (ref 70–99)
Glucose-Capillary: 41 mg/dL — CL (ref 70–99)
Glucose-Capillary: 49 mg/dL — ABNORMAL LOW (ref 70–99)
Glucose-Capillary: 55 mg/dL — ABNORMAL LOW (ref 70–99)

## 2022-03-20 LAB — MAGNESIUM: Magnesium: 2.5 mg/dL — ABNORMAL HIGH (ref 1.7–2.4)

## 2022-03-20 LAB — GLUCOSE, CAPILLARY
Glucose-Capillary: 200 mg/dL — ABNORMAL HIGH (ref 70–99)
Glucose-Capillary: 345 mg/dL — ABNORMAL HIGH (ref 70–99)
Glucose-Capillary: 348 mg/dL — ABNORMAL HIGH (ref 70–99)

## 2022-03-20 LAB — TROPONIN I (HIGH SENSITIVITY): Troponin I (High Sensitivity): 27 ng/L — ABNORMAL HIGH (ref <18)

## 2022-03-20 LAB — PROCALCITONIN: Procalcitonin: 0.13 ng/mL

## 2022-03-20 LAB — SARS CORONAVIRUS 2 BY RT PCR: SARS Coronavirus 2 by RT PCR: NEGATIVE

## 2022-03-20 MED ORDER — PREDNISONE 5 MG PO TABS
15.0000 mg | ORAL_TABLET | Freq: Every day | ORAL | Status: DC
  Administered 2022-03-21 – 2022-03-24 (×4): 15 mg via ORAL
  Filled 2022-03-20 (×4): qty 1

## 2022-03-20 MED ORDER — SODIUM CHLORIDE 0.9 % IV SOLN
2.0000 g | Freq: Two times a day (BID) | INTRAVENOUS | Status: DC
  Administered 2022-03-20 – 2022-03-21 (×3): 2 g via INTRAVENOUS
  Filled 2022-03-20 (×3): qty 12.5

## 2022-03-20 MED ORDER — LORAZEPAM 2 MG/ML IJ SOLN
1.0000 mg | Freq: Once | INTRAMUSCULAR | Status: AC | PRN
  Administered 2022-03-20: 1 mg via INTRAVENOUS
  Filled 2022-03-20: qty 1

## 2022-03-20 MED ORDER — DEXTROSE 10 % IV SOLN: INTRAVENOUS | Status: DC

## 2022-03-20 MED ORDER — LEVOTHYROXINE SODIUM 75 MCG PO CAPS: 75.0000 ug | ORAL_CAPSULE | Freq: Every day | ORAL | Status: DC

## 2022-03-20 MED ORDER — HALOPERIDOL LACTATE 5 MG/ML IJ SOLN
5.0000 mg | Freq: Once | INTRAMUSCULAR | Status: AC | PRN
  Administered 2022-03-20: 5 mg via INTRAVENOUS
  Filled 2022-03-20: qty 1

## 2022-03-20 MED ORDER — CARVEDILOL 6.25 MG PO TABS
6.2500 mg | ORAL_TABLET | Freq: Two times a day (BID) | ORAL | Status: DC
  Administered 2022-03-20 – 2022-03-24 (×8): 6.25 mg via ORAL
  Filled 2022-03-20 (×8): qty 1

## 2022-03-20 MED ORDER — IPRATROPIUM-ALBUTEROL 0.5-2.5 (3) MG/3ML IN SOLN
3.0000 mL | Freq: Four times a day (QID) | RESPIRATORY_TRACT | Status: DC
  Administered 2022-03-20 – 2022-03-22 (×7): 3 mL via RESPIRATORY_TRACT
  Filled 2022-03-20 (×8): qty 3

## 2022-03-20 MED ORDER — VANCOMYCIN HCL 1750 MG/350ML IV SOLN
1750.0000 mg | INTRAVENOUS | Status: DC
  Administered 2022-03-21: 1750 mg via INTRAVENOUS
  Filled 2022-03-20: qty 350

## 2022-03-20 MED ORDER — HYDROMORPHONE HCL 1 MG/ML IJ SOLN
0.5000 mg | INTRAMUSCULAR | Status: DC | PRN
  Administered 2022-03-20 (×3): 0.5 mg via INTRAVENOUS
  Filled 2022-03-20: qty 0.5
  Filled 2022-03-20: qty 1
  Filled 2022-03-20: qty 0.5

## 2022-03-20 MED ORDER — ROPINIROLE HCL 1 MG PO TABS
2.0000 mg | ORAL_TABLET | Freq: Every day | ORAL | Status: DC
  Administered 2022-03-20 – 2022-03-23 (×4): 2 mg via ORAL
  Filled 2022-03-20 (×4): qty 2

## 2022-03-20 MED ORDER — PANTOPRAZOLE SODIUM 40 MG PO TBEC
40.0000 mg | DELAYED_RELEASE_TABLET | Freq: Every day | ORAL | Status: DC
  Administered 2022-03-20 – 2022-03-24 (×5): 40 mg via ORAL
  Filled 2022-03-20 (×5): qty 1

## 2022-03-20 MED ORDER — LEVOTHYROXINE SODIUM 50 MCG PO TABS
75.0000 ug | ORAL_TABLET | Freq: Every day | ORAL | Status: DC
  Administered 2022-03-21 – 2022-03-24 (×4): 75 ug via ORAL
  Filled 2022-03-20 (×4): qty 1

## 2022-03-20 MED ORDER — APIXABAN 5 MG PO TABS
5.0000 mg | ORAL_TABLET | Freq: Two times a day (BID) | ORAL | Status: DC
  Administered 2022-03-20 – 2022-03-24 (×9): 5 mg via ORAL
  Filled 2022-03-20 (×9): qty 1

## 2022-03-20 MED ORDER — DEXTROSE 50 % IV SOLN: 25.0000 g | INTRAVENOUS | Status: DC | PRN

## 2022-03-20 MED ORDER — FOLIC ACID 1 MG PO TABS
2.0000 mg | ORAL_TABLET | Freq: Every day | ORAL | Status: DC
  Administered 2022-03-20 – 2022-03-24 (×5): 2 mg via ORAL
  Filled 2022-03-20 (×5): qty 2

## 2022-03-20 MED ORDER — QUETIAPINE FUMARATE 25 MG PO TABS
25.0000 mg | ORAL_TABLET | Freq: Every day | ORAL | Status: DC
  Administered 2022-03-20 – 2022-03-23 (×4): 25 mg via ORAL
  Filled 2022-03-20 (×4): qty 1

## 2022-03-20 MED ORDER — HYDROMORPHONE HCL 1 MG/ML IJ SOLN
0.5000 mg | INTRAMUSCULAR | Status: DC | PRN
  Administered 2022-03-22: 1 mg via INTRAVENOUS
  Filled 2022-03-20: qty 1

## 2022-03-20 MED ORDER — NALOXONE HCL 0.4 MG/ML IJ SOLN: 0.4000 mg | INTRAMUSCULAR | Status: DC | PRN

## 2022-03-20 MED ORDER — SODIUM CHLORIDE 0.9 % IV SOLN: INTRAVENOUS | Status: AC

## 2022-03-20 MED ORDER — METHYLPREDNISOLONE SODIUM SUCC 40 MG IJ SOLR
40.0000 mg | Freq: Once | INTRAMUSCULAR | Status: AC
  Administered 2022-03-20: 40 mg via INTRAVENOUS
  Filled 2022-03-20: qty 1

## 2022-03-20 MED ORDER — BACLOFEN 20 MG PO TABS
20.0000 mg | ORAL_TABLET | Freq: Two times a day (BID) | ORAL | Status: DC
  Administered 2022-03-20 – 2022-03-21 (×3): 20 mg via ORAL
  Filled 2022-03-20 (×2): qty 1
  Filled 2022-03-20: qty 2

## 2022-03-20 NOTE — ED Notes (Signed)
Please call patients wife with updates/questions.

## 2022-03-20 NOTE — Progress Notes (Addendum)
Pt found asleep and resting comfortably.  Per RN, pt given Ativan recently to rest. This writer was unable to initiate flutter therapy at this time.

## 2022-03-20 NOTE — Inpatient Diabetes Management (Addendum)
Inpatient Diabetes Program Recommendations  AACE/ADA: New Consensus Statement on Inpatient Glycemic Control (2015)  Target Ranges:  Prepandial:   less than 140 mg/dL      Peak postprandial:   less than 180 mg/dL (1-2 hours)      Critically ill patients:  140 - 180 mg/dL   Lab Results  Component Value Date   GLUCAP 49 (L) 03/20/2022   HGBA1C 9.0 (H) 03/11/2022    Review of Glycemic Control  Latest Reference Range & Units 03/20/22 10:38  Glucose 70 - 99 mg/dL 53 (L)  (L): Data is abnormally low Diabetes history: DM2 Outpatient Diabetes medications: Lantus 44 units QAM, Lantus 42 units QPM, Glucotrol 20 mg daily, Metformin 1000 mg QAM, Metformin 500 mg QPM Current orders for Inpatient glycemic control: Prednisone 15 mg QD Solumedrol 40 mg x 1 D10% @ 50 ml/hr Inpatient Diabetes Program Recommendations:    Noted hypoglycemia of 49 mg/dL and associated interventions.  Secure chat sent to RN to verify repeat CBG?  Anticipate need to decrease Lantus at discharge.  Consider increasing rate of D10% - 75 ml/hr and CBGs Q2H?  Secure chat sent to MD.  Follow  Spoke with patient and wife regarding outpatient diabetes management. Patient was last seen by DM coordinator on 03/15/22 (see note below). Has not had time yet to set up training, however is requesting a CGM. Admits to recent hypoglycemia at home; reporting atleast 4-5 times per week with current regimen. Since discharge, has only taken Lantus 36 units QD and wife feels that is still too much right now. Concern that hypoglycemia could also be responsible for fall.  Reviewed patient's current A1c of 9.0%. Explained what a A1c is and what it measures. Also reviewed goal A1c with patient, importance of good glucose control @ home, and blood sugar goals. Discussed Freestyle libre vs Dexcom. Patient's wife is interested. Need to assist.  Reviewed hypoglycemia, interventions and when to call MD.   Per prior hospitalization:  03/15/2022: Outpatient DM: Patient will need Rx for Dexcom from Endocrinology Peri Jefferson, PA-C) as he will need to be educated on how to apply and use the Dexcom CGM.   NOTE: Patient sees [facility] for DM management and last seen Peri Jefferson, PA-C on 02/12/22. Per note by Lyndel Safe, CPhT on 03/13/22, Prior Authorization for [employer], Clinical biochemist) has been approved.    PA# [id number] Effective dates: 03/13/2022 through 03/14/2023.  Thanks, Bronson Curb, MSN, RNC-OB Diabetes Coordinator [phone] (8a-5p)

## 2022-03-20 NOTE — H&P (Signed)
History and Physical    Patient: [name] BJY:[id number] DOB: [birth year] DOA: 03/19/2022 DOS: the patient was seen and examined on 03/20/2022 PCP: [name], DO  Patient coming from: Home  Chief Complaint:  Chief Complaint  Patient presents with   Fall   HPI: [name] is a 71 y.o. male with medical history significant of autoimmune myopathy, necrotizing myopathy, rheumatoid arthritis, history of bronchitis, chronic hiccups, type 2 diabetes, hyperlipidemia, male hypogonadism, hypothyroidism, CAD, history of NSTEMI, chronic diastolic heart failure, history of DVT, history of PE, GERD, CKD is stage IIIa, headaches, BPH who was recently admitted from 03/11/2022 until 03/15/2022 due to RSV pneumonia/left basilar bronchopneumonia, acute exacerbation of diastolic CHF who is coming to the emergency brought in by his wife with complaints of frequent falls including one earlier.  In the day where he hit his head and worsening body aches from his myopathy.  The patient was hypoglycemic on arrival to the ED.  He apparently only ate a banana and orange juice yesterday.  He was given lorazepam 1 mg IVP earlier and is currently sleeping.  He is unable to provide further information at the moment.  ED course:.  Initial vital signs were temperature 98.9 F, pulse 73, respiration 20, BP 116/79 and O2 sat 100% on room air.  CBG on arrival was 69%.  The patient received 500 mL LR bolus followed by LR at 150 mL/h, cefepime 2 g IVPB, 2000 mg of vancomycin, morphine 4 mg IVP, hydromorphone 0.5 mg IVP x2 followed by hydromorphone 0.5 mg every 2 hours as needed, ondansetron 4 mg IVP and lorazepam 1 mg IVP this morning.  Lab work: His CBC showed white count 9.6, Mono 10.4 g/dL platelets 264.  Total CK was normal.  CMP with a BUN of 39 and creatinine 1.64 mg deciliter, but otherwise normal.  MRSA and coronavirus PCR were negative.  BNP 54.3 pg/mL.  Lactic acid 3.5 and 2.7 then 2.6, then 1.3 mmol/L.  Troponin x227  ng/L.  Magnesium is 2.5 and phosphorus 4.0 g/dL.  Imaging: A 2 view chest radiograph with no active cardiopulmonary disease.  CT head without contrast no acute intracranial normality.  Sinus disease.  CT lumbar spine with no acute displaced fracture or traumatic listhesis of the lumbar spine.  There was aortic atherosclerosis.  CT cervical spine with no acute displaced fracture or traumatic listhesis of the cervical spine.   Review of Systems: As mentioned in the history of present illness. All other systems reviewed and are negative. Past Medical History:  Diagnosis Date   Autoimmune disease ([place])    Bronchitis    Chronic hiccups    Diabetes mellitus without complication (HCC)    DM2 (diabetes mellitus, type 2) ([place]) 09/28/2014   GERD (gastroesophageal reflux disease)    Hypogonadism in male 04/01/2018   Hypothyroidism    Joint pain    Necrotizing myopathy    Posttraumatic stress disorder 01/30/2022   PTSD (post-traumatic stress disorder)    Rheumatoid arthritis ([place]) 08/09/2021   RSV (respiratory syncytial virus pneumonia) 03/11/2022   Past Surgical History:  Procedure Laterality Date   BIOPSY SHOULDER     Left   IR ANGIOGRAM PULMONARY BILATERAL SELECTIVE  07/22/2021   IR THROMBECT VENO MECH MOD SED  07/22/2021   IR US GUIDE VASC ACCESS LEFT  07/22/2021   RADIOLOGY WITH ANESTHESIA Right 07/22/2021   Procedure: IR WITH ANESTHESIA;  Surgeon: Radiologist, Medication, MD;  Location: [facility];  Service: Radiology;  Laterality: Right;   UPPER  LEG SOFT TISSUE BIOPSY Right    Social History:  reports that he has never smoked. He has never used smokeless tobacco. He reports that he does not drink alcohol and does not use drugs.  Allergies  Allergen Reactions   Benazepril Cough   Gabapentin Swelling   Hydrocodone    Shellfish Allergy     Family History  Problem Relation Age of Onset   Hypertension Mother    Diabetes Mother    Arthritis Mother    Depression Mother    Heart attack  Father    Hypertension Father     Prior to Admission medications   Medication Sig Start Date End Date Taking? Authorizing Provider  acarbose (PRECOSE) 100 MG tablet Take 100 mg by mouth 3 (three) times daily with meals.    [provider]  acetaminophen (TYLENOL) 500 MG tablet See admin instructions. Take (1,000 mg) by mouth in the morning and (500 mg) by mouth at bedtime    [provider]  apixaban (ELIQUIS) 5 MG TABS tablet Take 1 tablet (5 mg total) by mouth 2 (two) times daily. 08/14/21   Loel Dubonnet, NP  atorvastatin (LIPITOR) 80 MG tablet Take 1 tablet (80 mg total) by mouth daily. Please attend scheduled appointment for additional refills. Patient taking differently: Take 80 mg by mouth every evening. Please attend scheduled appointment for additional refills. 03/02/22   Hilty, Nadean Corwin, MD  azithromycin (ZITHROMAX) 500 MG tablet Take 1 tablet (500 mg total) by mouth daily. 03/16/22   Elgergawy, Silver Huguenin, MD  baclofen (LIORESAL) 20 MG tablet TAKE 1 TABLET BY MOUTH TWICE A DAY 01/30/22   [name], DO  calcium carbonate (OS-CAL) 600 MG TABS tablet Take 600 mg by mouth daily.    [provider]  carvedilol (COREG) 6.25 MG tablet Take 1 tablet (6.25 mg total) by mouth 2 (two) times daily. 03/15/22   Elgergawy, Silver Huguenin, MD  cefdinir (OMNICEF) 300 MG capsule Take 1 capsule (300 mg total) by mouth 2 (two) times daily. 03/16/22   Elgergawy, Silver Huguenin, MD  Continuous Blood Gluc Sensor (FREESTYLE LIBRE 2 SENSOR) MISC Use to check glucose as needed.  Change every 14 days. Patient not taking: Reported on 01/30/2022 11/14/21   [name], DO  folic acid (FOLVITE) 1 MG tablet Take 2 mg by mouth daily.    [provider]  glipiZIDE (GLUCOTROL XL) 10 MG 24 hr tablet Take 20 mg by mouth daily.    [provider]  insulin glargine (LANTUS) 100 UNIT/ML injection Inject 0.46-0.56 mLs (46-56 Units total) into the skin at bedtime. For now take 30 units  twice a day and then increase to your usual dose as your oral intake improves. Increase both doses by 2units every day. Check your blood sugars at home three times daily. Call your doctor if sugar level is more than 300 or less than 80. Patient taking differently: Inject 42-44 Units into the skin 2 (two) times daily. Check your blood sugars at home three times daily. Call your doctor if sugar level is more than 300 or less than 80.  Take 44 units subcutaneously in the morning and 42 units subcutaneously at night. 10/01/14   Bonnielee Haff, MD  ipratropium-albuterol (DUONEB) 0.5-2.5 (3) MG/3ML SOLN Use 1 vial (31m) by nebulization 4 (four) times daily. 03/15/22   Elgergawy, DSilver Huguenin MD  Levothyroxine Sodium 75 MCG CAPS Take 75 mcg by mouth daily.    [provider]  loratadine (CLARITIN) 10  MG tablet Take 10 mg by mouth daily. 12/12/21   [provider]  metFORMIN (GLUCOPHAGE-XR) 500 MG 24 hr tablet Take by mouth See admin instructions. Take (1,000 mg) by mouth in the morning and (500 mg) by mouth at night. 07/24/21   [provider]  methotrexate (RHEUMATREX) 5 MG tablet Take 2 tablets (10 mg total) by mouth once a week. Caution: Chemotherapy. Protect from light. Take on fridays per patient.  OKAY TO RESUME FROM 10/08/14. Patient taking differently: Take 30 mg by mouth once a week. Caution: Chemotherapy. Protect from light. Take on fridays per patient.  6 Tabs once a week 10/01/14   Bonnielee Haff, MD  Methylphenidate HCl [facility] ER PO) Take by mouth. Dosing Unknown    [provider]  nystatin (MYCOSTATIN/NYSTOP) powder Apply 1 Application topically 3 (three) times daily. 05/08/21   [provider]  Roma Schanz test strip SMARTSIG:Via Meter 02/06/22   [provider]  pantoprazole (PROTONIX) 40 MG tablet Take 1 tablet (40 mg total) by mouth as needed. 01/09/22   Loel Dubonnet, NP  polyvinyl alcohol (LIQUIFILM TEARS) 1.4 % ophthalmic solution  1 drop as needed for dry eyes.    [provider]  predniSONE (DELTASONE) 5 MG tablet Please take 9 tablets ('45mg'$ ) by mouth once daily for 2 days, then 6 tablets ('30mg'$ ) once daily for 2 days, then back to 15 mg oral daily. 03/15/22   Elgergawy, Silver Huguenin, MD  PRESCRIPTION MEDICATION [facility] -- Unknown Name -- Unknown Dosing    [provider]  QUEtiapine (SEROQUEL) 25 MG tablet Take 1 tablet (25 mg total) by mouth at bedtime. 02/13/22   [name], DO  rOPINIRole (REQUIP) 1 MG tablet Take 2 tablets (2 mg total) by mouth at bedtime. 03/15/22   Elgergawy, Silver Huguenin, MD  spironolactone (ALDACTONE) 25 MG tablet Take 0.5 tablets (12.5 mg total) by mouth daily. 10/09/21 10/04/22  Loel Dubonnet, NP  torsemide (DEMADEX) 10 MG tablet Take 1 tablet (10 mg total) by mouth daily. 03/16/22   Elgergawy, Silver Huguenin, MD  traMADol (ULTRAM) 50 MG tablet See admin instructions. Take one tablet (50 mg) by mouth in the morning and half tablet (25 mg) by mouth at night. 08/04/19   [provider]  triamcinolone cream (KENALOG) 0.1 % Use 1 application twice daily as needed to red itchy areas below the face 09/30/18   Kennith Gain, MD  vitamin B-12 (CYANOCOBALAMIN) 100 MCG tablet Take 100 mcg by mouth daily.    [provider]    Physical Exam: Vitals:   03/20/22 0600 03/20/22 0700 03/20/22 0800 03/20/22 0833  BP: (!) 117/58 (!) 110/53 110/66   Pulse: 88 87 84   Resp: '11 12 13   '$ Temp:    (!) 96.6 F (35.9 C)  TempSrc:    Axillary  SpO2: 99% 100% 100%    Physical Exam Vitals and nursing note reviewed.  Constitutional:      General: He is sleeping. He is not in acute distress.    Appearance: Normal appearance. He is obese. He is ill-appearing. He is not toxic-appearing or diaphoretic.     Interventions: Nasal cannula in place.     Comments: Wakes up briefly. Answer simple questions. Chronically ill-appearing.  HENT:     Head: Normocephalic.     Nose:  No rhinorrhea.     Mouth/Throat:     Mouth: Mucous membranes are dry.  Eyes:     General: No scleral  icterus.    Pupils: Pupils are equal, round, and reactive to light.  Neck:     Vascular: No JVD.  Cardiovascular:     Rate and Rhythm: Normal rate and regular rhythm.     Heart sounds: S1 normal and S2 normal.  Pulmonary:     Effort: Pulmonary effort is normal.     Breath sounds: Normal breath sounds. No wheezing, rhonchi or rales.  Abdominal:     General: Bowel sounds are normal.     Palpations: Abdomen is soft.     Tenderness: There is no abdominal tenderness. There is no guarding.  Musculoskeletal:     Cervical back: Neck supple.     Right lower leg: No edema.     Left lower leg: No edema.     Comments: Unable to evaluate strength due to sedation.  Neurological:     General: No focal deficit present.     Mental Status: He is easily aroused.  Psychiatric:        Mood and Affect: Mood normal.   Data Reviewed:  There are no new results to review at this time.  03/15/2022 echocardiogram IMPRESSIONS    1. Left ventricular ejection fraction, by estimation, is 55 to 60%. The  left ventricle has normal function. The left ventricle demonstrates  regional wall motion abnormalities (see scoring diagram/findings for  description). Left ventricular diastolic  parameters are consistent with Grade I diastolic dysfunction (impaired  relaxation).   2. Right ventricular systolic function is normal. The right ventricular  size is normal.   3. The mitral valve is normal in structure. No evidence of mitral valve  regurgitation. No evidence of mitral stenosis.   4. The aortic valve is tricuspid. There is mild calcification of the  aortic valve. There is mild thickening of the aortic valve. Aortic valve  regurgitation is not visualized. Aortic valve sclerosis is present, with  no evidence of aortic valve stenosis.   5. Aortic dilatation noted. There is mild dilatation of the aortic root,   measuring 41 mm.   6. The inferior vena cava is normal in size with greater than 50%  respiratory variability, suggesting right atrial pressure of 3 mmHg.   Assessment and Plan: Principal Problem:   Autoimmune myopathy Presenting with   Frequent falls Inpatient/PCU. Consult physical therapy. Treat hypoglycemia. Solu-Medrol 40 mg x 1. Slower prednisone taper afterwards. Continue analgesics as needed. Supportive care. May need temporary rehab.  Active Problems:   Lactic acidosis Likely due to volume depletion. Hold metformin for now. Resolved now with low suspicion for active infection..    Hypoglycemia Hold glipizide and metformin. Hold Lantus.   Encourage p.o. intake. May have not taken prednisone. Solu-Medrol 40 mg IVP x1 dose now. Will likely need slower prednisone taper.    Acute kidney injury superimposed on CKD stage3a  Continue IV fluids.. Hold diuretics. Avoid hypotension. Avoid nephrotoxins. Monitor intake and output. Monitor renal function electrolytes.    GERD (gastroesophageal reflux disease) Continue pantoprazole 40 mg p.o. daily.    Chronic diastolic heart failure (HCC) No signs of decompensation. Continue beta-blocker. Holding diuretics.    Benign essential HTN Holding diuretics. Continue carvedilol 6.25 mg p.o. twice daily.    History of pulmonary embolism   History of DVT (deep vein thrombosis) Continue apixaban twice daily.    CAD (coronary artery disease) Continue beta-blocker and apixaban. Holding atorvastatin due to myopathy.    Hypothyroidism Recent TSH was normal. Continue levothyroxine 75 mcg p.o. daily.  Hyperlipidemia Hold atorvastatin while myopathy pain exacerbated.    Advance Care Planning:   Code Status: Full Code   Consults:   Family Communication:   Severity of Illness: The appropriate patient status for this patient is INPATIENT. Inpatient status is judged to be reasonable and necessary in order to provide  the required intensity of service to ensure the patient's safety. The patient's presenting symptoms, physical exam findings, and initial radiographic and laboratory data in the context of their chronic comorbidities is felt to place them at high risk for further clinical deterioration. Furthermore, it is not anticipated that the patient will be medically stable for discharge from the hospital within 2 midnights of admission.   * I certify that at the point of admission it is my clinical judgment that the patient will require inpatient hospital care spanning beyond 2 midnights from the point of admission due to high intensity of service, high risk for further deterioration and high frequency of surveillance required.*  Author: Reubin Milan, MD 03/20/2022 9:03 AM  For on call review [email or web address].   This document was prepared using Dragon voice recognition software and may contain some unintended transcription errors.

## 2022-03-20 NOTE — Telephone Encounter (Signed)
Patient currently admitted, will reach back out once discharged

## 2022-03-20 NOTE — Progress Notes (Signed)
  Carryover admission to the Day Admitter.  I discussed this case with the EDP, Dr.  Maryan Rued.  Per these discussions:   This is a 71 year old history of chronic systolic heart failure 1610 for which she remains on Eliquis, who is being admitted for frequent falls and concern regarding possibility of HCAP pneumonia.   Most recently, system, 03/15/2022 to home following hospitalization for acute encephalopathy during which she was viral lower respiratory tract infection for which she tested positive for RSV this previous association was also associated with reported exacerbation of his chronic myositis.  His previous visualization was associated with recurrent hypotension cardiology was consulted for assistance with reconciliation of his heart failure medications discontinued his Entresto and torsemide and home dose of Coreg resume his torsemide on 03/19/2022 but did not have the opportunity to do this before presenting twice to the emergency department the last few days with ground level mechanical falls.   The fourth which the patient had originally presented during previous hospitalization reportedly it improved, but for significantly worsening over the last few days, it is productive in nature.  In the absence of his torsemide, he is noted worsening of edema in the bilateral lower extremities.  Today in the ED he is found to have mildly elevated temperature of 100.2 with a significantly productive cough.  He does not have an elevation of his white blood cell count though there is a noted neutrophilic units.  Actiq acid elevated at 3.5.  As he is on Eliquis for anticoagulation in the setting of embolism as well as DVT diagnosed in March 2023 and had CT head in the ED today as component of evaluation of his presenting fall today in which he did hit his head, but in the absence of any loss of consciousness, CT head negative.  Chest x-ray showed no evidence of acute process, including or edema.  He is  initially being admitted for frequency falls.  There is also concern for false negative pneumonia, with some concern for HCAP given that it sent to the patient's cough had initially improved RSV lower respiratory tract infection, but reports significantly worsening over the last few days inferential.  In this context, EDP started IV vancomycin and cefepime, which I will continue for now procalcitonin level ordered we will also ordered COVID-19 his temperature does not meet threshold for SIRS criteria but in the absence of leukocytosis, SIRS criteria are not met for sepsis at this time.  He does have an elevated lactate of, is receiving some gentle IV fluids in the absence of bolus given concomitant presence of edema in the bilateral lower extremities.  Repeat lactate is pending.   I have placed an order for inpatient admission for further evaluation management of the above.  I have placed some additional preliminary admit orders via the adult multi-morbid admission order set. I have also ordered continuation of IV vancomycin, cefepime procalcitonin this is [place], flutter valve, COVID-19 PCR in setting of his recurrent falls over the last few days ago for fall precautions and physical therapy consult for the morning.  Also ordered morning labs including CMP, CBC, serum magnesium will and also checked phosphorus level.  I have continued his home Eliquis for treatment of DVT/PE from March '23.     Babs Bertin, DO Hospitalist

## 2022-03-20 NOTE — ED Notes (Signed)
Pt saturations 87-88% on RA, placed on 2 liters, improved saturations.  Pt still hollering intermittently, but seems to have periods of rest. Pt repositioned. Pt spouse remains at the bedside.

## 2022-03-20 NOTE — Evaluation (Signed)
Physical Therapy Evaluation Patient Details Name: [name] MRN: [id number] DOB: Aug 19, 1950 Today's Date: 03/20/2022  History of Present Illness  [name] is a 71 y.o. male with medical history significant of autoimmune myopathy, necrotizing myopathy, rheumatoid arthritis, history of bronchitis, chronic hiccups, type 2 diabetes, hyperlipidemia, male hypogonadism, hypothyroidism, CAD, history of NSTEMI, chronic diastolic heart failure, history of DVT, history of PE, GERD, CKD is stage IIIa, headaches, BPH who was recently admitted from 03/11/2022 until 03/15/2022 due to RSV pneumonia/left basilar bronchopneumonia, acute exacerbation of diastolic CHF and DC home.Patient  is coming to the emergency 03/19/22 with  frequent falls,  fell and hit his head, complains  of worsening body aches from his myopathy.  The patient was hypoglycemic on arrival to the ED  Clinical Impression  Pt admitted with above diagnosis.  Pt currently with functional limitations due to the deficits listed below (see PT Problem List). Pt will benefit from skilled PT to increase their independence and safety with mobility to allow discharge to the venue listed below.    Patient's evaluation is very limited with patient  moaning and resisting mobility, reporting "Pain all over." Patient was ambulatory  with rollator on 11/14. No family present.          Recommendations for follow up therapy are one component of a multi-disciplinary discharge planning process, led by the attending physician.  Recommendations may be updated based on patient status, additional functional criteria and insurance authorization.  Follow Up Recommendations Skilled nursing-short term rehab (<3 hours/day) Can patient physically be transported by private vehicle: No    Assistance Recommended at Discharge Frequent or constant Supervision/Assistance  Patient can return home with the following  A lot of help with walking and/or transfers;Help with  stairs or ramp for entrance;Assist for transportation;A lot of help with bathing/dressing/bathroom    Equipment Recommendations None recommended by PT  Recommendations for Other Services       Functional Status Assessment Patient has had a recent decline in their functional status and demonstrates the ability to make significant improvements in function in a reasonable and predictable amount of time.     Precautions / Restrictions Precautions Precautions: Fall      Mobility  Bed Mobility Overal bed mobility: Needs Assistance Bed Mobility: Rolling Rolling: +2 for safety/equipment, +2 for physical assistance, Total assist         General bed mobility comments: patient resistive to rolling to place draw sheet, moaning and  tenses up, statiing he is in pain.    Transfers                   General transfer comment: unable to assess    Ambulation/Gait                  Stairs            Wheelchair Mobility    Modified Rankin (Stroke Patients Only)       Balance               Standing balance comment: unable to assess                             Pertinent Vitals/Pain Pain Assessment Pain Assessment: Faces Faces Pain Scale: Hurts worst Pain Location: "all over" anywhere therapist touched or moved. Pain Descriptors / Indicators: Grimacing, Guarding, Moaning, Restless Pain Intervention(s): Limited activity within patient's tolerance, Monitored during session    Home Living Family/patient  expects to be discharged to:: Private residence Living Arrangements: Spouse/significant other Available Help at Discharge: Family;Available 24 hours/day   Home Access: Stairs to enter Entrance Stairs-Rails: None Entrance Stairs-Number of Steps: 3 Alternate Level Stairs-Number of Steps: 18 Home Layout: Two level;Bed/bath upstairs Home Equipment: Rollator (4 wheels);Cane - single point;Transport chair;Hand held shower head      Prior  Function Prior Level of Function : Needs assist       Physical Assist : Mobility (physical);ADLs (physical) Mobility (physical): Bed mobility;Transfers;Gait;Stairs ADLs (physical): Bathing;Dressing;Toileting Mobility Comments: Pt uses rollator for mobility. If LE's are very swollen then pt with very limited household mobility. When legs less swollen mobilizes more. Wife assist PRN., on 11/14 at [facility], amb x 58 ' with rollator. ADLs Comments: Wife can assist PRN.     Hand Dominance   Dominant Hand: Right    Extremity/Trunk Assessment   Upper Extremity Assessment Upper Extremity Assessment: Difficult to assess due to impaired cognition (difficult to asses due to pain)    Lower Extremity Assessment Lower Extremity Assessment: Difficult to assess due to impaired cognition (and pain) RLE Deficits / Details: patient  barely lifts the leg from bed LLE Deficits / Details: barely lifts leg from bed.       Communication   Communication:  (minimally verbal except stating" Leave me alone, my arm hurts.")  Cognition Arousal/Alertness: Awake/alert   Overall Cognitive Status: Difficult to assess                                 General Comments: ptient minimally responded, did not anser any questions, no family present.        General Comments      Exercises     Assessment/Plan    PT Assessment Patient needs continued PT services  PT Problem List Decreased strength;Decreased activity tolerance;Decreased balance;Decreased mobility;Pain       PT Treatment Interventions DME instruction;Gait training;Functional mobility training;Therapeutic activities;Therapeutic exercise;Balance training;Patient/family education;Stair training    PT Goals (Current goals can be found in the Care Plan section)  Acute Rehab PT Goals Patient Stated Goal: "Leave me alone" PT Goal Formulation: Patient unable to participate in goal setting Time For Goal Achievement: 04/03/22 Potential to  Achieve Goals: Fair    Frequency Min 2X/week     Co-evaluation               AM-PAC PT "6 Clicks" Mobility  Outcome Measure Help needed turning from your back to your side while in a flat bed without using bedrails?: Total Help needed moving from lying on your back to sitting on the side of a flat bed without using bedrails?: Total Help needed moving to and from a bed to a chair (including a wheelchair)?: Total Help needed standing up from a chair using your arms (e.g., wheelchair or bedside chair)?: Total Help needed to walk in hospital room?: Total Help needed climbing 3-5 steps with a railing? : Total 6 Click Score: 6    End of Session   Activity Tolerance: Patient limited by pain Patient left: in bed;with call bell/phone within reach Nurse Communication: Mobility status PT Visit Diagnosis: Unsteadiness on feet (R26.81);Other abnormalities of gait and mobility (R26.89);History of falling (Z91.81);Muscle weakness (generalized) (M62.81);Pain    Time: 1130-1145 PT Time Calculation (min) (ACUTE ONLY): 15 min   Charges:   PT Evaluation $PT Eval Low Complexity: 1 Low          Santiago Glad  [place] [phone] Weekend [phone]   Claretha Cooper 03/20/2022, 1:40 PM

## 2022-03-20 NOTE — Progress Notes (Signed)
Pharmacy Antibiotic Note  [name] is a 71 y.o. male admitted on 03/19/2022 with pneumonia.  Pharmacy has been consulted for Cefepime + Vancomycin dosing.  Plan: Cefepime 2gm IV q12h Vancomycin '1750mg'$  IV q24h to target AUC 400-550 Estimated AUC = 517 F/U MRSA PCR & consider d/c Vanc if negative Check Vancomycin levels at steady state Monitor renal function and cx data      Temp (24hrs), Avg:99.3 F (37.4 C), Min:98.8 F (37.1 C), Max:100.2 F (37.9 C)  Recent Labs  Lab 03/13/22 0308 03/14/22 0344 03/15/22 0519 03/16/22 0639 03/16/22 0642 03/19/22 1548 03/19/22 2200  WBC 4.4 3.7* 6.0 6.1  --  9.6  --   CREATININE 1.26* 1.32* 1.45* 1.57* 1.70* 1.64*  --   LATICACIDVEN  --   --   --   --   --   --  3.5*    Estimated Creatinine Clearance: 44.9 mL/min (A) (by C-G formula based on SCr of 1.64 mg/dL (H)).    Allergies  Allergen Reactions   Benazepril Cough   Gabapentin Swelling   Hydrocodone    Shellfish Allergy     Antimicrobials this admission: 11/20 Vancomycin >>  11/20 Cefepime >>   Dose adjustments this admission:  Microbiology results: 11/20 BCx:  11/20 MRSA PCR:   Thank you for allowing pharmacy to be a part of this patient's care.  Netta Cedars PharmD 03/20/2022 12:27 AM

## 2022-03-21 ENCOUNTER — Inpatient Hospital Stay (HOSPITAL_COMMUNITY): Payer: No Typology Code available for payment source

## 2022-03-21 ENCOUNTER — Other Ambulatory Visit (HOSPITAL_COMMUNITY): Payer: Self-pay

## 2022-03-21 DIAGNOSIS — G7249 Other inflammatory and immune myopathies, not elsewhere classified: Secondary | ICD-10-CM | POA: Diagnosis not present

## 2022-03-21 DIAGNOSIS — G9341 Metabolic encephalopathy: Secondary | ICD-10-CM | POA: Diagnosis not present

## 2022-03-21 DIAGNOSIS — I2510 Atherosclerotic heart disease of native coronary artery without angina pectoris: Secondary | ICD-10-CM | POA: Diagnosis not present

## 2022-03-21 DIAGNOSIS — I10 Essential (primary) hypertension: Secondary | ICD-10-CM

## 2022-03-21 DIAGNOSIS — I2583 Coronary atherosclerosis due to lipid rich plaque: Secondary | ICD-10-CM

## 2022-03-21 LAB — COMPREHENSIVE METABOLIC PANEL
ALT: 29 U/L (ref 0–44)
AST: 29 U/L (ref 15–41)
Albumin: 3.4 g/dL — ABNORMAL LOW (ref 3.5–5.0)
Alkaline Phosphatase: 50 U/L (ref 38–126)
Anion gap: 8 (ref 5–15)
BUN: 26 mg/dL — ABNORMAL HIGH (ref 8–23)
CO2: 23 mmol/L (ref 22–32)
Calcium: 9.2 mg/dL (ref 8.9–10.3)
Chloride: 113 mmol/L — ABNORMAL HIGH (ref 98–111)
Creatinine, Ser: 1.29 mg/dL — ABNORMAL HIGH (ref 0.61–1.24)
GFR, Estimated: 59 mL/min — ABNORMAL LOW (ref >60)
Glucose, Bld: 251 mg/dL — ABNORMAL HIGH (ref 70–99)
Potassium: 4.4 mmol/L (ref 3.5–5.1)
Sodium: 144 mmol/L (ref 135–145)
Total Bilirubin: 1.5 mg/dL — ABNORMAL HIGH (ref 0.3–1.2)
Total Protein: 6.2 g/dL — ABNORMAL LOW (ref 6.5–8.1)

## 2022-03-21 LAB — CBC
HCT: 27.3 % — ABNORMAL LOW (ref 39.0–52.0)
Hemoglobin: 8.8 g/dL — ABNORMAL LOW (ref 13.0–17.0)
MCH: 31.9 pg (ref 26.0–34.0)
MCHC: 32.2 g/dL (ref 30.0–36.0)
MCV: 98.9 fL (ref 80.0–100.0)
Platelets: 208 10*3/uL (ref 150–400)
RBC: 2.76 MIL/uL — ABNORMAL LOW (ref 4.22–5.81)
RDW: 14.6 % (ref 11.5–15.5)
WBC: 9.5 10*3/uL (ref 4.0–10.5)
nRBC: 0.5 % — ABNORMAL HIGH (ref 0.0–0.2)

## 2022-03-21 LAB — GLUCOSE, CAPILLARY
Glucose-Capillary: 159 mg/dL — ABNORMAL HIGH (ref 70–99)
Glucose-Capillary: 211 mg/dL — ABNORMAL HIGH (ref 70–99)
Glucose-Capillary: 250 mg/dL — ABNORMAL HIGH (ref 70–99)
Glucose-Capillary: 254 mg/dL — ABNORMAL HIGH (ref 70–99)
Glucose-Capillary: 301 mg/dL — ABNORMAL HIGH (ref 70–99)
Glucose-Capillary: 304 mg/dL — ABNORMAL HIGH (ref 70–99)
Glucose-Capillary: 324 mg/dL — ABNORMAL HIGH (ref 70–99)
Glucose-Capillary: 333 mg/dL — ABNORMAL HIGH (ref 70–99)
Glucose-Capillary: 96 mg/dL (ref 70–99)

## 2022-03-21 LAB — VITAMIN B12: Vitamin B-12: 400 pg/mL (ref 180–914)

## 2022-03-21 LAB — TSH: TSH: 0.721 u[IU]/mL (ref 0.350–4.500)

## 2022-03-21 LAB — CK: Total CK: 482 U/L — ABNORMAL HIGH (ref 49–397)

## 2022-03-21 LAB — AMMONIA: Ammonia: 19 umol/L (ref 9–35)

## 2022-03-21 MED ORDER — CYCLOBENZAPRINE HCL 5 MG PO TABS
5.0000 mg | ORAL_TABLET | Freq: Three times a day (TID) | ORAL | Status: DC | PRN
  Administered 2022-03-21 – 2022-03-23 (×2): 5 mg via ORAL
  Filled 2022-03-21 (×2): qty 1

## 2022-03-21 MED ORDER — INSULIN ASPART 100 UNIT/ML IJ SOLN
0.0000 [IU] | Freq: Three times a day (TID) | INTRAMUSCULAR | Status: DC
  Administered 2022-03-21: 11 [IU] via SUBCUTANEOUS
  Administered 2022-03-22: 8 [IU] via SUBCUTANEOUS
  Administered 2022-03-22 (×2): 3 [IU] via SUBCUTANEOUS
  Administered 2022-03-23: 8 [IU] via SUBCUTANEOUS
  Administered 2022-03-23: 15 [IU] via SUBCUTANEOUS
  Administered 2022-03-23: 11 [IU] via SUBCUTANEOUS
  Administered 2022-03-24: 5 [IU] via SUBCUTANEOUS
  Administered 2022-03-24: 8 [IU] via SUBCUTANEOUS

## 2022-03-21 MED ORDER — PIPERACILLIN-TAZOBACTAM 3.375 G IVPB
3.3750 g | Freq: Three times a day (TID) | INTRAVENOUS | Status: DC
  Administered 2022-03-21 – 2022-03-23 (×6): 3.375 g via INTRAVENOUS
  Filled 2022-03-21 (×6): qty 50

## 2022-03-21 MED ORDER — CYCLOBENZAPRINE HCL 5 MG PO TABS
5.0000 mg | ORAL_TABLET | Freq: Once | ORAL | Status: AC
  Administered 2022-03-21: 5 mg via ORAL
  Filled 2022-03-21: qty 1

## 2022-03-21 MED ORDER — INSULIN ASPART 100 UNIT/ML IJ SOLN
0.0000 [IU] | Freq: Every day | INTRAMUSCULAR | Status: DC
  Administered 2022-03-22 – 2022-03-23 (×2): 4 [IU] via SUBCUTANEOUS

## 2022-03-21 MED ORDER — SODIUM CHLORIDE 0.9 % IV SOLN: INTRAVENOUS | Status: DC

## 2022-03-21 NOTE — Inpatient Diabetes Management (Signed)
Inpatient Diabetes Program Recommendations  AACE/ADA: New Consensus Statement on Inpatient Glycemic Control (2015)  Target Ranges:  Prepandial:   less than 140 mg/dL      Peak postprandial:   less than 180 mg/dL (1-2 hours)      Critically ill patients:  140 - 180 mg/dL   Lab Results  Component Value Date   GLUCAP 254 (H) 03/21/2022   HGBA1C 9.0 (H) 03/11/2022    Review of Glycemic Control  Latest Reference Range & Units 03/21/22 06:33 03/21/22 07:47 03/21/22 09:54  Glucose-Capillary 70 - 99 mg/dL 250 (H) 211 (H) 254 (H)  (H): Data is abnormally high  Diabetes history: DM2 Outpatient Diabetes medications: Lantus 36 units QD, Precose 100 mg TID, Glipizide 10 mg QD, Metformin 1000 mg with BF and 1000 mg qhs Current orders for Inpatient glycemic control: Prednisone 15 units QAM  Inpatient Diabetes Program Recommendations:    Please consider:  Semglee 18 units QD (0.2 units x 91.5 kg) Novolog 0-9 units TID and 0-5 QHS  Please discontinue Glipizide at discharge given frequent hypoglycemia episodes at home.    Spoke with wife at bedside.  She confirms above home medications.  Per pharmacy the Dexcom will be $177/month through his [employer] operational [employer].  Wife states he will not pay that amount.  Explained if they go through the [place] it may be covered at 100%.  She states they will do that; he does have a doctor at the [place].    Current IVF are NS.  D10 was started yesterday for a severe low and discontinued when glucose was > 300 mg/dL.    Asked pharmacist to modify med rec to show Lantus 36 QD as wife states this is what he' been taking.    Provided her with a sample Dexcom as he will likely DC over the holiday weekend and out team will be working remotely.  Explained how to apply, manage and remove.  Provided her you tube videos on how to apply and use as well.  Asked her to set low alarm to 80 mg/dl.    She states Metformin makes him feel bad and not want to eat.  Might  consider discontinuing if he cannot tolerate.    Will continue to follow while inpatient.  Thank you, Reche Dixon, MSN, [place] Diabetes Coordinator Inpatient Diabetes Program [phone] (team pager from 8a-5p)

## 2022-03-21 NOTE — Progress Notes (Signed)
PHARMACY NOTE -  [facility] has been assisting with dosing of Zosyn for PNA. Dosage remains stable at 3.375 g IV q8 hr and further renal adjustments per institutional Pharmacy antibiotic protocol  Pharmacy will sign off, following peripherally for culture results or dose adjustments. Please reconsult if a change in clinical status warrants re-evaluation of dosage.  Reuel Boom, PharmD, BCPS [phone] 03/21/2022, 2:53 PM

## 2022-03-21 NOTE — Assessment & Plan Note (Signed)
Blood pressure normal - Hold torsemide and spironolactone - Give IV fluids - Continue carvedilol

## 2022-03-21 NOTE — Assessment & Plan Note (Signed)
Hemoglobin stable relative to baseline, no clinical bleeding

## 2022-03-21 NOTE — Progress Notes (Signed)
Progress Note   Patient: [name] EXN:[id number] DOB: 07/22/1950 DOA: 03/19/2022     2 DOS: the patient was seen and examined on 03/21/2022 at 11:45 AM and 2:15 PM      Brief hospital course: [name] is a 71 y.o. M with hx autoimmune myopathy on prednisone and methotrexate, chronic hiccups on baclofen, DM, HLD, hypothyroidism, hypogonadism formerly on testosterone, off since submassive PE Mar 2023 requiring thrombectomy, now on Eliquis, dCHF, and CKD IIIa baseline 1.2 as well as recent admission for RSV complicated by delirium who presented with a fall.  In the ER, hypoglycemic, lactic acid 3.5.  COVID negative.  CXR without active disease.  CT head NAICP.  CT lumbar and cervical spine normal.        Assessment and Plan: * Acute metabolic encephalopathy At baseline, patient is interactive, appropriate.  Here, he is disoriented, confused.  Possibly hypoglycemia, dehydration.  No clear signs of infection (CXR clear, UA without cells, COVID negative) and CTH normal.  No localizing signs or symptoms on my exam or recognized by nursing/family.  Possibly toxic from baclofen (for hiccups) or cefepime.   - Continue IV fliuds - Continue empiric antibiotics MRSA negative so stop vancomyicn and will transition to Zosyn to rule out cefepime associated encephalopathy  - Check B12, RPR, ammonia, TSH - Check EEG    Hypoglycemia Glucose is now elevated - Hold dextrose  Frequent falls - PT eval  Obesity (BMI 30-39.9) BMI 30.6  Hyperlipidemia - Hold atorvastatin  History of pulmonary embolism - Continue Eliquis  Autoimmune myopathy - Hold methotrexate - Continue prednisone - Check CK  CKD (chronic kidney disease), stage III (HCC) CKD stage IIIa, creatinine stable at 1.3  Chronic diastolic heart failure (HCC) Appears dehydrated - Hold torsemide, spironolactone  CAD (coronary artery disease) - Hold home atorvastatin - Continue carvedilol, Eliquis  Benign essential  HTN Blood pressure normal - Hold torsemide and spironolactone - Give IV fluids - Continue carvedilol  Hypothyroidism - Check TSH - Continue levothyroxine  Macrocytic anemia Hemoglobin stable relative to baseline, no clinical bleeding  DM2 (diabetes mellitus, type 2) (HCC) Hypoglycemic on arrival.  Now hyperglycemic. - Hold acarbose, Lantus, metformin, glipizide - Start sliding scale corrections          Subjective: Patient is disoriented, does not respond to questions intelligibly.  He has had no fever, family and nursing have noted no respiratory symptoms, no vomiting, no consistent pain complaints.     Physical Exam: BP (!) 116/93 (BP Location: Right Arm)   Pulse 100   Temp 98.2 F (36.8 C) (Oral)   Resp 20   Wt 91.5 kg   SpO2 96%   BMI 30.67 kg/m   Elderly adult male, lying in bed, restless and agitated, eyes mostly closed, does not respond appropriately to questions Tachycardic, regular, no murmurs, no peripheral edema Respiratory rate normal, lungs clear without rales or wheezes Abdomen soft without tenderness palpation or guarding, no ascites or distention, no rigidity, rebound, grimace Attention diminished, affect blunted, face symmetric, speech fluent but nonsensical, does not respond appropriately to questions, appears to be able to move all 4 extremities with generalized decrease in tone    Data Reviewed: Basic metabolic panel shows creatinine down to 1.3 Lactic acid normalized Glucose elevated  Family Communication: Wife and son at the bedside    Disposition: Status is: Inpatient Patient was admitted with acute metabolic encephalopathy of unknown cause  He remains densely encephalopathic, will require further work-up for IV fluids and  IV antibiotics  Likely discharge to SNF at the time of discharge        Author: Edwin Dada, MD 03/21/2022 2:46 PM  For on call review [email or web address].

## 2022-03-21 NOTE — Assessment & Plan Note (Signed)
-   Hold atorvastatin 

## 2022-03-21 NOTE — Assessment & Plan Note (Addendum)
Hypoglycemic on arrival.  Treated with D5W and became hyperglycemic.  Glucoses labile over the last 24 hours, mostly elevated.  Hemoglobin A1c 9%. - Hold acarbose, Lantus, metformin, glipizide - Continue sliding scale corrections - Resume Semglee, lower than home dose - Start mealtime insulin if  eat full meal

## 2022-03-21 NOTE — Assessment & Plan Note (Signed)
-   Continue Eliquis 

## 2022-03-21 NOTE — Assessment & Plan Note (Signed)
-   Hold home atorvastatin - Continue carvedilol, Eliquis

## 2022-03-21 NOTE — Assessment & Plan Note (Signed)
BMI 30.6 

## 2022-03-21 NOTE — Telephone Encounter (Signed)
Transition Care Management Unsuccessful Follow-up Telephone Call  Date of discharge and from where:  03/16/22 from [facility]  Attempts:  3rd Attempt  Reason for unsuccessful TCM follow-up call:  No answer/busy

## 2022-03-21 NOTE — Assessment & Plan Note (Signed)
PT eval.

## 2022-03-21 NOTE — Progress Notes (Signed)
Patient yelling out but denies pain, trying to get out of bed, agitated, will not let staff or family touch or calm him down or redirect him. Haldol given, not really effective, J. Danies-NP notified order written and given for ativan, patient agitation decreased. Will continue to assess patient.

## 2022-03-21 NOTE — Assessment & Plan Note (Signed)
No fluid overload - Hold torsemide, spironolactone

## 2022-03-21 NOTE — Assessment & Plan Note (Addendum)
At baseline, patient is interactive, appropriate.  Here, he was disoriented, confused initially.  Given fluids and resolved to baseline.  Suspect it is from hypoglycemia, dehydration.  No clear signs of infection (CXR clear, UA without cells, COVID negative) and CTH normal.  No localizing signs or symptoms on my exam or recognized by nursing/family.  Ammonia, TSH, RPR, B12 normal.  Given spontaneous resolution, do not suspect seizures. Possibly toxic from baclofen (for hiccups) or cefepime.  Baclofen held on admission.  Cefepime stopped 11/22.   - Continue oral fluids - Hold IV antibiotics  - PT eval

## 2022-03-21 NOTE — Hospital Course (Addendum)
Mr. [name] is a 71 y.o. M with hx autoimmune myopathy on prednisone and methotrexate, sdCHF EF 35-40%, chronic hiccups on baclofen, DM, HLD, hypothyroidism, hypogonadism formerly on testosterone, off since submassive PE Mar 2023 requiring thrombectomy, now on Eliquis, and CKD IIIa baseline 1.2 as well as recent admission for RSV complicated by delirium who presented with a fall.  In the ER, hypoglycemic, lactic acid 3.5.  COVID negative.  CXR without active disease.  CT head NAICP.  CT lumbar and cervical spine normal.

## 2022-03-21 NOTE — Assessment & Plan Note (Addendum)
TSH normal ?- Continue levothyroxine ?

## 2022-03-21 NOTE — Assessment & Plan Note (Signed)
CKD stage IIIa, creatinine stable at 1.3

## 2022-03-21 NOTE — Assessment & Plan Note (Addendum)
Resolved

## 2022-03-21 NOTE — TOC Initial Note (Signed)
Transition of Care [facility]) - Initial/Assessment Note    Patient Details  Name: [name] MRN: [id number] Date of Birth: 09/27/1950  [facility] CM/SW Contact:    Leeroy Cha, RN Phone Number: 03/21/2022, 7:25 AM  Clinical Narrative:                 Patient will need snf placement when stable.  Expected Discharge Plan: Skilled Nursing Facility Barriers to Discharge: Continued Medical Work up   Patient Goals and CMS Choice Patient states their goals for this hospitalization and ongoing recovery are:: not stated CMS Medicare.gov Compare Post Acute Care list provided to:: Patient    Expected Discharge Plan and Services Expected Discharge Plan: [place]   Discharge Planning Services: CM Consult Post Acute Care Choice: [place] Living arrangements for the past 2 months: Single Family Home                                      Prior Living Arrangements/Services Living arrangements for the past 2 months: Single Family Home Lives with:: Spouse Patient language and need for interpreter reviewed:: Yes Do you feel safe going back to the place where you live?: Yes            Criminal Activity/Legal Involvement Pertinent to Current Situation/Hospitalization: No - Comment as needed  Activities of Daily Living      Permission Sought/Granted                  Emotional Assessment Appearance:: Appears stated age Attitude/Demeanor/Rapport: Gracious Affect (typically observed): Calm Orientation: : Oriented to Self, Oriented to Place, Oriented to  Time, Oriented to Situation Alcohol / Substance Use: Never Used Psych Involvement: No (comment)  Admission diagnosis:  Frequent falls [R29.6] Bilateral lower extremity edema [R60.0] Fall, initial encounter [W19.XXXA] Sepsis with acute renal failure without septic shock, due to unspecified organism, unspecified acute renal failure type ([place]) [A41.9, R65.20, N17.9] Patient  Active Problem List   Diagnosis Date Noted   Sepsis due to undetermined organism ([place]) 03/20/2022   Hypoglycemia 03/20/2022   Frequent falls 45/80/9983   Acute metabolic encephalopathy 38/25/0539   RSV (respiratory syncytial virus pneumonia) 03/11/2022   Sinusitis, acute frontal 03/11/2022   Chronic diastolic heart failure ([place]) 03/11/2022   CKD (chronic kidney disease), stage III ([place]) 03/11/2022   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   History of DVT (deep vein thrombosis) 03/11/2022   History of pulmonary embolism 03/11/2022   Hyperlipidemia 03/11/2022   Obesity (BMI 30-39.9) 03/11/2022   CAD (coronary artery disease) 01/30/2022   Depression 01/30/2022   Cardiomyopathy ([place]) 01/30/2022   Posttraumatic stress disorder 01/30/2022   Rheumatoid arthritis ([place]) 08/09/2021   New onset of headaches after age 60 08/09/2021   Non-ST elevation (NSTEMI) myocardial infarction [facility])    Acute combined systolic and diastolic heart failure ([place])    Acute pulmonary embolism ([place]) 07/22/2021   Acute DVT (deep venous thrombosis) ([place]) 07/22/2021   Acute kidney injury superimposed on CKD stage3a  07/22/2021   Lactic acidosis 07/22/2021   Elevated troponin 07/22/2021   Acute respiratory failure with hypoxia (HCC) 07/22/2021   Anxiety 07/22/2021   Ankle swelling 01/17/2020   Fatigue 12/12/2018   GERD (gastroesophageal reflux disease) 06/04/2018   Hypogonadism in male 04/01/2018   Macrocytic anemia 09/30/2017   Itching 09/30/2017   Hypothyroidism 09/30/2017   Low testosterone 09/30/2017   Benign prostatic hyperplasia without  lower urinary tract symptoms 09/30/2017   Erectile dysfunction 03/19/2016   Benign essential HTN 08/08/2015   Branch retinal vein occlusion with macular edema of left eye 03/25/2015   Nuclear sclerotic cataract of both eyes 03/25/2015   Myositis 09/28/2014   DM2 (diabetes mellitus, type 2) ([place]) 09/28/2014   Abnormal glucose 10/04/2012   PCP:  [name], DO Pharmacy:    [id number] [place] [id number]: [phone]: [phone] [id number] [id number] [id number]: [phone]: [phone]   Social Determinants of Health (SDOH) Interventions    Readmission Risk Interventions   Row Labels 07/27/2021   10:06 AM 07/26/2021    1:16 PM  Readmission Risk Prevention Plan   Section Header. No data exists in this row.    Transportation Screening   Complete Complete  PCP or Specialist Appt within 5-7 Days    Complete  PCP or Specialist Appt within 3-5 Days   Complete   Home Care Screening    Complete  Medication Review (RN CM)    Complete  HRI or Home Care Consult   Complete   Social Work Consult for ReOneontalanning/Counseling   Complete   Palliative Care Screening   Not Applicable   Medication Review (RPress photographer  Complete

## 2022-03-21 NOTE — Assessment & Plan Note (Signed)
CK close to baseline - Hold methotrexate - Continue prednisone

## 2022-03-22 ENCOUNTER — Inpatient Hospital Stay (HOSPITAL_COMMUNITY): Payer: No Typology Code available for payment source

## 2022-03-22 DIAGNOSIS — M25519 Pain in unspecified shoulder: Secondary | ICD-10-CM | POA: Insufficient documentation

## 2022-03-22 DIAGNOSIS — N1831 Chronic kidney disease, stage 3a: Secondary | ICD-10-CM

## 2022-03-22 DIAGNOSIS — G9341 Metabolic encephalopathy: Secondary | ICD-10-CM | POA: Diagnosis not present

## 2022-03-22 DIAGNOSIS — I2510 Atherosclerotic heart disease of native coronary artery without angina pectoris: Secondary | ICD-10-CM | POA: Diagnosis not present

## 2022-03-22 DIAGNOSIS — I5042 Chronic combined systolic (congestive) and diastolic (congestive) heart failure: Secondary | ICD-10-CM

## 2022-03-22 DIAGNOSIS — G7249 Other inflammatory and immune myopathies, not elsewhere classified: Secondary | ICD-10-CM | POA: Diagnosis not present

## 2022-03-22 LAB — GLUCOSE, CAPILLARY
Glucose-Capillary: 187 mg/dL — ABNORMAL HIGH (ref 70–99)
Glucose-Capillary: 191 mg/dL — ABNORMAL HIGH (ref 70–99)
Glucose-Capillary: 292 mg/dL — ABNORMAL HIGH (ref 70–99)
Glucose-Capillary: 327 mg/dL — ABNORMAL HIGH (ref 70–99)

## 2022-03-22 LAB — CBC
HCT: 28.6 % — ABNORMAL LOW (ref 39.0–52.0)
Hemoglobin: 9.1 g/dL — ABNORMAL LOW (ref 13.0–17.0)
MCH: 31.3 pg (ref 26.0–34.0)
MCHC: 31.8 g/dL (ref 30.0–36.0)
MCV: 98.3 fL (ref 80.0–100.0)
Platelets: 220 10*3/uL (ref 150–400)
RBC: 2.91 MIL/uL — ABNORMAL LOW (ref 4.22–5.81)
RDW: 14.3 % (ref 11.5–15.5)
WBC: 7.3 10*3/uL (ref 4.0–10.5)
nRBC: 0.5 % — ABNORMAL HIGH (ref 0.0–0.2)

## 2022-03-22 LAB — RPR: RPR Ser Ql: NONREACTIVE

## 2022-03-22 LAB — COMPREHENSIVE METABOLIC PANEL
ALT: 35 U/L (ref 0–44)
AST: 45 U/L — ABNORMAL HIGH (ref 15–41)
Albumin: 3.3 g/dL — ABNORMAL LOW (ref 3.5–5.0)
Alkaline Phosphatase: 52 U/L (ref 38–126)
Anion gap: 10 (ref 5–15)
BUN: 20 mg/dL (ref 8–23)
CO2: 23 mmol/L (ref 22–32)
Calcium: 9.7 mg/dL (ref 8.9–10.3)
Chloride: 112 mmol/L — ABNORMAL HIGH (ref 98–111)
Creatinine, Ser: 1.17 mg/dL (ref 0.61–1.24)
GFR, Estimated: >60 mL/min (ref >60)
Glucose, Bld: 173 mg/dL — ABNORMAL HIGH (ref 70–99)
Potassium: 3.9 mmol/L (ref 3.5–5.1)
Sodium: 145 mmol/L (ref 135–145)
Total Bilirubin: 1.6 mg/dL — ABNORMAL HIGH (ref 0.3–1.2)
Total Protein: 6 g/dL — ABNORMAL LOW (ref 6.5–8.1)

## 2022-03-22 MED ORDER — IPRATROPIUM-ALBUTEROL 0.5-2.5 (3) MG/3ML IN SOLN
3.0000 mL | Freq: Three times a day (TID) | RESPIRATORY_TRACT | Status: DC
  Administered 2022-03-22 – 2022-03-24 (×6): 3 mL via RESPIRATORY_TRACT
  Filled 2022-03-22 (×6): qty 3

## 2022-03-22 MED ORDER — POLYVINYL ALCOHOL 1.4 % OP SOLN
1.0000 [drp] | OPHTHALMIC | Status: DC | PRN
  Administered 2022-03-22 – 2022-03-24 (×3): 1 [drp] via OPHTHALMIC
  Filled 2022-03-22: qty 15

## 2022-03-22 NOTE — Progress Notes (Signed)
Orthopedic Tech Progress Note Patient Details:  Tywan Siever 14-Jun-1950 [id number]  Patient ID: Tong Pieczynski, male   DOB: Sep 24, 1950, 71 y.o.   MRN: [id number]  Kennis Carina 03/22/2022, 1:03 PM Right sling immobilizer applied

## 2022-03-22 NOTE — Assessment & Plan Note (Signed)
Due to fall.  Initial 1V x-ray suggested fracture of scapula, but follow up CT of both scapulae ruled this out.

## 2022-03-22 NOTE — Progress Notes (Signed)
Progress Note   Patient: [name] HUD:[id number] DOB: 1950/09/09 DOA: 03/19/2022     3 DOS: the patient was seen and examined on 03/22/2022 at 9:34AM      Brief hospital course: [name] is a 72 y.o. M with hx autoimmune myopathy on prednisone and methotrexate, sdCHF EF 35-40%, chronic hiccups on baclofen, DM, HLD, hypothyroidism, hypogonadism formerly on testosterone, off since submassive PE Mar 2023 requiring thrombectomy, now on Eliquis, and CKD IIIa baseline 1.2 as well as recent admission for RSV complicated by delirium who presented with a fall.  In the ER, hypoglycemic, lactic acid 3.5.  COVID negative.  CXR without active disease.  CT head NAICP.  CT lumbar and cervical spine normal.        Assessment and Plan: * Acute metabolic encephalopathy At baseline, patient is interactive, appropriate.  Here, he was disoriented, confused initially.  Today this is improved, but not back to baseline.  Suspect it is from hypoglycemia, dehydration.  No clear signs of infection (CXR clear, UA without cells, COVID negative) and CTH normal, although infection still hard to rule out.  No localizing signs or symptoms on my exam or recognized by nursing/family.  Ammonia, TSH, B12 normal.   Possibly toxic from baclofen (for hiccups) or cefepime.  Cefepime stopped 11/22.   - Push oral fluids - Continue empiric Zosyn for now  - Check RPR  - Check EEG - PT eval    Shoulder pain Due to fall.  Initial 1V x-ray suggested fracture of scapula, but follow up CT of both scapulae ruled this out.  Hypoglycemia Resolved  Frequent falls - PT eval  Obesity (BMI 30-39.9) BMI 30.6  Hyperlipidemia - Hold atorvastatin  History of pulmonary embolism - Continue Eliquis  Autoimmune myopathy CK close to baseline - Hold methotrexate - Continue prednisone   CKD (chronic kidney disease), stage III (HCC) CKD stage IIIa, creatinine stable at 1.3  Chronic combined systolic and diastolic CHF  (congestive heart failure) (HCC) No fluid overload - Hold torsemide, spironolactone  CAD (coronary artery disease) - Hold home atorvastatin - Continue carvedilol, Eliquis  Benign essential HTN Blood pressure normal - Hold torsemide and spironolactone - Continue carvedilol  Hypothyroidism TSH normal - Continue levothyroxine  Macrocytic anemia Hemoglobin stable relative to baseline, no clinical bleeding  DM2 (diabetes mellitus, type 2) ([place]) Hypoglycemic on arrival.  Treated with D5W and became hyperglycemic.  Now euglycemic. - Hold acarbose, Lantus, metformin, glipizide - Continue sliding scale corrections          Subjective: Patient more alert today, complaining of right shoulder pain and neck pain.  No fever, cough, urinary symptoms.  No abdominal pain.     Physical Exam: BP 109/62 (BP Location: Right Arm)   Pulse 90   Temp 98.8 F (37.1 C) (Oral)   Resp 15   Wt 86.8 kg   SpO2 96%   BMI 29.10 kg/m   Elderly adult male, lying in bed, weak and tired, but opens eyes and is alert and interactive today Heart rate slightly elevated but regular, no murmurs that I can appreciate, no peripheral edema Respiratory rate normal, lungs clear without rales or wheezes Abdomen soft without tenderness palpation or guarding in all quadrants, no ascites or distention, no rigidity Attentive and responsive to my questions, affect blunted appears tired but face symmetric, speech fluent, responds appropriate to questions, has generalized weakness and upper extremity strength is limited by pain  Data Reviewed: Basic metabolic panel shows creatinine sodium and potassium normal  Glucose mostly controlled AST slightly elevated 45, total bilirubin slightly elevated at 1.6 CK4 82 Vitamin B-12 normal Hemoglobin 9.1, no change TSH normal    Family Communication: Wife at the bedside, son at the bedside    Disposition: Status is: Inpatient Patient presented with altered mental  status, he remains altered  We will continue current cares, IV antibiotics and monitor for resolution of encephalopathy        Author: Edwin Dada, MD 03/22/2022 2:44 PM  For on call review [email or web address].

## 2022-03-22 NOTE — Plan of Care (Signed)

## 2022-03-23 DIAGNOSIS — G9341 Metabolic encephalopathy: Secondary | ICD-10-CM | POA: Diagnosis not present

## 2022-03-23 DIAGNOSIS — I2510 Atherosclerotic heart disease of native coronary artery without angina pectoris: Secondary | ICD-10-CM | POA: Diagnosis not present

## 2022-03-23 DIAGNOSIS — I10 Essential (primary) hypertension: Secondary | ICD-10-CM | POA: Diagnosis not present

## 2022-03-23 DIAGNOSIS — G7249 Other inflammatory and immune myopathies, not elsewhere classified: Secondary | ICD-10-CM | POA: Diagnosis not present

## 2022-03-23 LAB — CBC WITH DIFFERENTIAL/PLATELET
Abs Immature Granulocytes: 0.16 10*3/uL — ABNORMAL HIGH (ref 0.00–0.07)
Basophils Absolute: 0 10*3/uL (ref 0.0–0.1)
Basophils Relative: 0 %
Eosinophils Absolute: 0 10*3/uL (ref 0.0–0.5)
Eosinophils Relative: 1 %
HCT: 27.5 % — ABNORMAL LOW (ref 39.0–52.0)
Hemoglobin: 8.7 g/dL — ABNORMAL LOW (ref 13.0–17.0)
Immature Granulocytes: 2 %
Lymphocytes Relative: 14 %
Lymphs Abs: 1 10*3/uL (ref 0.7–4.0)
MCH: 31.5 pg (ref 26.0–34.0)
MCHC: 31.6 g/dL (ref 30.0–36.0)
MCV: 99.6 fL (ref 80.0–100.0)
Monocytes Absolute: 0.4 10*3/uL (ref 0.1–1.0)
Monocytes Relative: 5 %
Neutro Abs: 5.5 10*3/uL (ref 1.7–7.7)
Neutrophils Relative %: 78 %
Platelets: 232 10*3/uL (ref 150–400)
RBC: 2.76 MIL/uL — ABNORMAL LOW (ref 4.22–5.81)
RDW: 14.8 % (ref 11.5–15.5)
WBC: 7.1 10*3/uL (ref 4.0–10.5)
nRBC: 1.3 % — ABNORMAL HIGH (ref 0.0–0.2)

## 2022-03-23 LAB — GLUCOSE, RANDOM: Glucose, Bld: 541 mg/dL (ref 70–99)

## 2022-03-23 LAB — COMPREHENSIVE METABOLIC PANEL
ALT: 28 U/L (ref 0–44)
AST: 25 U/L (ref 15–41)
Albumin: 2.9 g/dL — ABNORMAL LOW (ref 3.5–5.0)
Alkaline Phosphatase: 46 U/L (ref 38–126)
Anion gap: 8 (ref 5–15)
BUN: 18 mg/dL (ref 8–23)
CO2: 24 mmol/L (ref 22–32)
Calcium: 9.1 mg/dL (ref 8.9–10.3)
Chloride: 107 mmol/L (ref 98–111)
Creatinine, Ser: 1.41 mg/dL — ABNORMAL HIGH (ref 0.61–1.24)
GFR, Estimated: 53 mL/min — ABNORMAL LOW (ref >60)
Glucose, Bld: 279 mg/dL — ABNORMAL HIGH (ref 70–99)
Potassium: 3.8 mmol/L (ref 3.5–5.1)
Sodium: 139 mmol/L (ref 135–145)
Total Bilirubin: 1.4 mg/dL — ABNORMAL HIGH (ref 0.3–1.2)
Total Protein: 5.8 g/dL — ABNORMAL LOW (ref 6.5–8.1)

## 2022-03-23 LAB — GLUCOSE, CAPILLARY
Glucose-Capillary: 268 mg/dL — ABNORMAL HIGH (ref 70–99)
Glucose-Capillary: 316 mg/dL — ABNORMAL HIGH (ref 70–99)
Glucose-Capillary: 508 mg/dL (ref 70–99)
Glucose-Capillary: 520 mg/dL (ref 70–99)
Glucose-Capillary: 557 mg/dL (ref 70–99)
Glucose-Capillary: 434 mg/dL — ABNORMAL HIGH (ref 70–99)
Glucose-Capillary: 350 mg/dL — ABNORMAL HIGH (ref 70–99)

## 2022-03-23 LAB — BETA-HYDROXYBUTYRIC ACID: Beta-Hydroxybutyric Acid: 0.11 mmol/L (ref 0.05–0.27)

## 2022-03-23 LAB — BASIC METABOLIC PANEL
Anion gap: 8 (ref 5–15)
BUN: 20 mg/dL (ref 8–23)
CO2: 22 mmol/L (ref 22–32)
Calcium: 8.7 mg/dL — ABNORMAL LOW (ref 8.9–10.3)
Chloride: 104 mmol/L (ref 98–111)
Creatinine, Ser: 1.53 mg/dL — ABNORMAL HIGH (ref 0.61–1.24)
GFR, Estimated: 48 mL/min — ABNORMAL LOW (ref >60)
Glucose, Bld: 413 mg/dL — ABNORMAL HIGH (ref 70–99)
Potassium: 4.2 mmol/L (ref 3.5–5.1)
Sodium: 134 mmol/L — ABNORMAL LOW (ref 135–145)

## 2022-03-23 LAB — CBC
HCT: 27.9 % — ABNORMAL LOW (ref 39.0–52.0)
Hemoglobin: 8.9 g/dL — ABNORMAL LOW (ref 13.0–17.0)
MCH: 31.3 pg (ref 26.0–34.0)
MCHC: 31.9 g/dL (ref 30.0–36.0)
MCV: 98.2 fL (ref 80.0–100.0)
Platelets: 225 10*3/uL (ref 150–400)
RBC: 2.84 MIL/uL — ABNORMAL LOW (ref 4.22–5.81)
RDW: 14.7 % (ref 11.5–15.5)
WBC: 5.8 10*3/uL (ref 4.0–10.5)
nRBC: 1 % — ABNORMAL HIGH (ref 0.0–0.2)

## 2022-03-23 LAB — MAGNESIUM: Magnesium: 2.1 mg/dL (ref 1.7–2.4)

## 2022-03-23 MED ORDER — INSULIN ASPART 100 UNIT/ML IJ SOLN
3.0000 [IU] | Freq: Three times a day (TID) | INTRAMUSCULAR | Status: DC
  Administered 2022-03-23 – 2022-03-24 (×3): 3 [IU] via SUBCUTANEOUS

## 2022-03-23 MED ORDER — INSULIN GLARGINE-YFGN 100 UNIT/ML ~~LOC~~ SOLN
12.0000 [IU] | Freq: Every day | SUBCUTANEOUS | Status: DC
  Administered 2022-03-23: 12 [IU] via SUBCUTANEOUS
  Filled 2022-03-23 (×2): qty 0.12

## 2022-03-23 MED ORDER — LORATADINE 10 MG PO TABS
10.0000 mg | ORAL_TABLET | Freq: Every day | ORAL | Status: DC
  Administered 2022-03-23 – 2022-03-24 (×2): 10 mg via ORAL
  Filled 2022-03-23 (×2): qty 1

## 2022-03-23 NOTE — TOC Progression Note (Addendum)
Transition of Care [facility]) - Progression Note    Patient Details  Name: [name] MRN: [id number] Date of Birth: [birth year]  [facility] of [facility]) CM/SW Contact  Leeroy Cha, RN Phone Number: 03/23/2022, 9:58 AM  Clinical Narrative:    Information for inpatient rehab faxed to the community living center at [phone]. Tct-csw-message left.to return call.   Expected Discharge Plan: [place] Barriers to Discharge: Continued Medical Work up  Expected Discharge Plan and Services Expected Discharge Plan: [place]   Discharge Planning Services: CM Consult Post Acute Care Choice: [place] Living arrangements for the past 2 months: Single Family Home                                       Social Determinants of Health (SDOH) Interventions    Readmission Risk Interventions   Row Labels 03/21/2022    9:18 AM 07/27/2021   10:06 AM 07/26/2021    1:16 PM  Readmission Risk Prevention Plan   Section Header. No data exists in this row.     Transportation Screening   Complete Complete Complete  PCP or Specialist Appt within 5-7 Days     Complete  PCP or Specialist Appt within 3-5 Days    Complete   Home Care Screening     Complete  Medication Review (RN CM)     Complete  HRI or Home Care Consult    Complete   Social Work Consult for [place] Planning/Counseling    Complete   Palliative Care Screening    Not Applicable   Medication Review Press photographer)   Complete Complete   PCP or Specialist appointment within 3-5 days of discharge   Complete    HRI or Home Care Consult   Complete    SW Recovery Care/Counseling Consult   Complete    Palliative Care Screening   Not [place]   Complete

## 2022-03-23 NOTE — Progress Notes (Signed)
Physical Therapy Treatment Patient Details Name: [name] MRN: [id number] DOB: 03/05/1951 Today's Date: 03/23/2022   History of Present Illness [name] is a 71 y.o. male with medical history significant of autoimmune myopathy, necrotizing myopathy, rheumatoid arthritis, history of bronchitis, chronic hiccups, type 2 diabetes, hyperlipidemia, male hypogonadism, hypothyroidism, CAD, history of NSTEMI, chronic diastolic heart failure, history of DVT, history of PE, GERD, CKD is stage IIIa, headaches, BPH who was recently admitted from 03/11/2022 until 03/15/2022 due to RSV pneumonia/left basilar bronchopneumonia, acute exacerbation of diastolic CHF and DC home.Patient  is coming to the emergency 03/19/22 with  frequent falls,  fell and hit his head, complains  of worsening body aches from his myopathy.  The patient was hypoglycemic on arrival to the ED    PT Comments    Pt able to ambulate short distance in hallway with RW.  Pt's family present and report they can assist pt at home.  Pt typically uses rollator at home.  Updated d/c recommendations for HHPT since family able to provide assist and pt now able to ambulate.    Recommendations for follow up therapy are one component of a multi-disciplinary discharge planning process, led by the attending physician.  Recommendations may be updated based on patient status, additional functional criteria and insurance authorization.  Follow Up Recommendations  Home health PT Can patient physically be transported by private vehicle: Yes   Assistance Recommended at Discharge Intermittent Supervision/Assistance  Patient can return home with the following Help with stairs or ramp for entrance;Assist for transportation;A little help with bathing/dressing/bathroom;A little help with walking and/or transfers   Equipment Recommendations  None recommended by PT    Recommendations for Other Services       Precautions / Restrictions  Precautions Precautions: Fall     Mobility  Bed Mobility               General bed mobility comments: pt in recliner on arrival    Transfers Overall transfer level: Needs assistance Equipment used: Rolling walker (2 wheels) Transfers: Sit to/from Stand Sit to Stand: Min guard           General transfer comment: verbal cues for UE positioning    Ambulation/Gait Ambulation/Gait assistance: Min assist, Min guard Gait Distance (Feet): 50 Feet Assistive device: Rolling walker (2 wheels) Gait Pattern/deviations: Step-through pattern, Decreased stride length, Trunk flexed Gait velocity: decr     General Gait Details: verbal cues for RW positioning and posture; min assist near end of ambulation with fatigue, recliner following and utilized  (pt's first time walking)   Marine scientist [place] (Stroke Patients Only)       Balance                                            Cognition Arousal/Alertness: Awake/alert Behavior During Therapy: WFL for tasks assessed/performed Overall Cognitive Status: Within Functional Limits for tasks assessed                                          Exercises      General Comments        Pertinent Vitals/Pain Pain Assessment Pain Assessment: No/denies pain  Home Living                          Prior Function            PT Goals (current goals can now be found in the care plan section) Progress towards PT goals: Progressing toward goals    Frequency    Min 3X/week      PT Plan Discharge plan needs to be updated    Co-evaluation              AM-PAC PT "6 Clicks" Mobility   Outcome Measure  Help needed turning from your back to your side while in a flat bed without using bedrails?: A Little Help needed moving from lying on your back to sitting on the side of a flat bed without using bedrails?: A Little Help  needed moving to and from a bed to a chair (including a wheelchair)?: A Little Help needed standing up from a chair using your arms (e.g., wheelchair or bedside chair)?: A Little Help needed to walk in hospital room?: A Lot Help needed climbing 3-5 steps with a railing? : A Lot 6 Click Score: 16    End of Session Equipment Utilized During Treatment: Gait belt Activity Tolerance: Patient tolerated treatment well Patient left: in chair;with call bell/phone within reach;with chair alarm set;with family/visitor present   PT Visit Diagnosis: Difficulty in walking, not elsewhere classified (R26.2);Muscle weakness (generalized) (M62.81)     Time: 5284-1324 PT Time Calculation (min) (ACUTE ONLY): 12 min  Charges:  $Gait Training: 8-22 mins                    Jannette Spanner PT, DPT Physical Therapist Acute Rehabilitation Services Preferred contact method: Secure Chat Weekend Pager Only: [phone] Office: [place] 03/23/2022, 4:45 PM

## 2022-03-23 NOTE — Plan of Care (Signed)
  Problem: Education: Goal: Knowledge of General Education information will improve Description: Including pain rating scale, medication(s)/side effects and non-pharmacologic comfort measures Outcome: Progressing   Problem: Activity: Goal: Risk for activity intolerance will decrease Outcome: Progressing   Problem: Nutrition: Goal: Adequate nutrition will be maintained Outcome: Progressing   Problem: Coping: Goal: Level of anxiety will decrease Outcome: Progressing   Problem: Health Behavior/Discharge Planning: Goal: Ability to manage health-related needs will improve Outcome: Progressing

## 2022-03-23 NOTE — Progress Notes (Signed)
Progress Note   Patient: [name] QAS:[id number] DOB: [birth year] DOA: 03/19/2022     4 DOS: the patient was seen and examined on 03/23/2022 at 9:39 AM      Brief hospital course: Mr. Weida is a 71 y.o. M with hx autoimmune myopathy on prednisone and methotrexate, sdCHF EF 35-40%, chronic hiccups on baclofen, DM, HLD, hypothyroidism, hypogonadism formerly on testosterone, off since submassive PE Mar 2023 requiring thrombectomy, now on Eliquis, and CKD IIIa baseline 1.2 as well as recent admission for RSV complicated by delirium who presented with a fall.  In the ER, hypoglycemic, lactic acid 3.5.  COVID negative.  CXR without active disease.  CT head NAICP.  CT lumbar and cervical spine normal.        Assessment and Plan: * Acute metabolic encephalopathy At baseline, patient is interactive, appropriate.  Here, he was disoriented, confused initially.  Given fluids and resolved to baseline.  Suspect it is from hypoglycemia, dehydration.  No clear signs of infection (CXR clear, UA without cells, COVID negative) and CTH normal.  No localizing signs or symptoms on my exam or recognized by nursing/family.  Ammonia, TSH, RPR, B12 normal.  Given spontaneous resolution, do not suspect seizures. Possibly toxic from baclofen (for hiccups) or cefepime.  Baclofen held on admission.  Cefepime stopped 11/22.   - Continue oral fluids - Hold IV antibiotics  - PT eval    Shoulder pain Due to fall.  Initial 1V x-ray suggested fracture of scapula, but follow up CT of both scapulae ruled this out.  Hypoglycemia Resolved  Frequent falls - PT eval  Obesity (BMI 30-39.9) BMI 30.6  Hyperlipidemia - Hold atorvastatin  History of pulmonary embolism - Continue Eliquis  Autoimmune myopathy CK close to baseline - Hold methotrexate - Continue prednisone   CKD (chronic kidney disease), stage III (HCC) CKD stage IIIa, creatinine stable at 1.3  Chronic combined systolic and diastolic CHF  (congestive heart failure) (HCC) No fluid overload - Hold torsemide, spironolactone  CAD (coronary artery disease) - Hold home atorvastatin - Continue carvedilol, Eliquis  Benign essential HTN Blood pressure normal - Hold torsemide and spironolactone - Continue carvedilol  Hypothyroidism TSH normal - Continue levothyroxine  Macrocytic anemia Hemoglobin stable relative to baseline, no clinical bleeding  DM2 (diabetes mellitus, type 2) ([place]) Hypoglycemic on arrival.  Treated with D5W and became hyperglycemic.  Glucoses labile over the last 24 hours, mostly elevated.  Hemoglobin A1c 9%. - Hold acarbose, Lantus, metformin, glipizide - Continue sliding scale corrections - Resume Semglee, lower than home dose - Start mealtime insulin if  eat full meal          Subjective: Patient is improving, he has no fever, respiratory symptoms, abdominal symptoms, vomiting, confusion, or new pains.     Physical Exam: BP 108/61 (BP Location: Right Arm)   Pulse 98   Temp 98.6 F (37 C) (Oral)   Resp 16   Wt 86.8 kg   SpO2 99%   BMI 29.10 kg/m   Adult male, sitting up in bed, interactive and appropriate, makes eye contact, hygiene normal RRR, no murmurs, no peripheral edema Respiratory rate normal, lungs clear without rales or wheezes Abdomen soft without tenderness palpation or guarding Generalized global weakness, speech fluent, face symmetric    Data Reviewed: Glucose elevated Creatinine 1.4 stable LFTs normal Hemoglobin 8.9, no change  Family Communication: Wife and son at the bedside    Disposition: Status is: Inpatient The patient was admitted with acute metabolic encephalopathy  He  is improving but will need substantial rehabilitation return to his prior level of function.  We will have PT reevaluate him, I suspect he will need SNF, if not we may be able to discharge tomorrow with home health follow-up, monitor off antibiotics for 24  hours        Author: Edwin Dada, MD 03/23/2022 2:26 PM  For on call review [email or web address].

## 2022-03-23 NOTE — Plan of Care (Signed)
Patient lying in bed with son at bedside.  Denies pain, discuss high bgl and possible need for icu and insulin drip.  Nurse directed by np to redraw bgl at 2200.

## 2022-03-23 NOTE — Inpatient Diabetes Management (Signed)
Inpatient Diabetes Program Recommendations  AACE/ADA: New Consensus Statement on Inpatient Glycemic Control (2015)  Target Ranges:  Prepandial:   less than 140 mg/dL      Peak postprandial:   less than 180 mg/dL (1-2 hours)      Critically ill patients:  140 - 180 mg/dL    Latest Reference Range & Units 03/22/22 08:10 03/22/22 11:25 03/22/22 16:04 03/22/22 22:01  Glucose-Capillary 70 - 99 mg/dL 187 (H)  3 units Novolog  191 (H)  3 units Novolog  292 (H)  8 units Novolog  327 (H)  4 units Novolog   (H): Data is abnormally high  Latest Reference Range & Units 03/23/22 07:51 03/23/22 11:40  Glucose-Capillary 70 - 99 mg/dL 268 (H)  8 units Novolog  316 (H)  (H): Data is abnormally high     Home DM Meds: Lantus 36 units Daily Precose 100 mg TID Glipizide 10 mg Daily Metformin 1000 mg with BF and 1000 mg qhs    Current Orders: Novolog Moderate Correction Scale/ SSI (0-15 units) TID AC + HS      Prednisone 15 mg daily     MD- Note issues with Hypoglycemia on 11/21.  CBGs now >250.  Please consider adding back portion of pt's home dose basal insulin:  Recommend Semglee 12 units QHS (1/3 total home dose)  If eating well, may also consider adding Novolog Meal Coverage: Novolog 3 units TID with meals HOLD if pt NPO HOLD if pt eats <50% meals   --Will follow patient during hospitalization--  Wyn Quaker RN, MSN, [place] Diabetes Coordinator Inpatient Glycemic Control Team Team Pager: [phone] (8a-5p)

## 2022-03-24 DIAGNOSIS — G9341 Metabolic encephalopathy: Secondary | ICD-10-CM | POA: Diagnosis not present

## 2022-03-24 LAB — BASIC METABOLIC PANEL
Anion gap: 12 (ref 5–15)
BUN: 17 mg/dL (ref 8–23)
CO2: 21 mmol/L — ABNORMAL LOW (ref 22–32)
Calcium: 8.9 mg/dL (ref 8.9–10.3)
Chloride: 106 mmol/L (ref 98–111)
Creatinine, Ser: 1.36 mg/dL — ABNORMAL HIGH (ref 0.61–1.24)
GFR, Estimated: 55 mL/min — ABNORMAL LOW (ref >60)
Glucose, Bld: 335 mg/dL — ABNORMAL HIGH (ref 70–99)
Potassium: 3.8 mmol/L (ref 3.5–5.1)
Sodium: 139 mmol/L (ref 135–145)

## 2022-03-24 LAB — CBC
HCT: 32.3 % — ABNORMAL LOW (ref 39.0–52.0)
Hemoglobin: 10 g/dL — ABNORMAL LOW (ref 13.0–17.0)
MCH: 31.4 pg (ref 26.0–34.0)
MCHC: 31 g/dL (ref 30.0–36.0)
MCV: 101.6 fL — ABNORMAL HIGH (ref 80.0–100.0)
Platelets: 255 10*3/uL (ref 150–400)
RBC: 3.18 MIL/uL — ABNORMAL LOW (ref 4.22–5.81)
RDW: 14.8 % (ref 11.5–15.5)
WBC: 7.5 10*3/uL (ref 4.0–10.5)
nRBC: 1.3 % — ABNORMAL HIGH (ref 0.0–0.2)

## 2022-03-24 LAB — URINALYSIS, ROUTINE W REFLEX MICROSCOPIC
Bacteria, UA: NONE SEEN
Bilirubin Urine: NEGATIVE
Glucose, UA: >=500 mg/dL — AB
Ketones, ur: NEGATIVE mg/dL
Leukocytes,Ua: NEGATIVE
Nitrite: NEGATIVE
Protein, ur: NEGATIVE mg/dL
Specific Gravity, Urine: 1.016 (ref 1.005–1.030)
pH: 5 (ref 5.0–8.0)

## 2022-03-24 LAB — OSMOLALITY: Osmolality: 308 mOsm/kg — ABNORMAL HIGH (ref 275–295)

## 2022-03-24 LAB — GLUCOSE, CAPILLARY
Glucose-Capillary: 272 mg/dL — ABNORMAL HIGH (ref 70–99)
Glucose-Capillary: 212 mg/dL — ABNORMAL HIGH (ref 70–99)
Glucose-Capillary: 233 mg/dL — ABNORMAL HIGH (ref 70–99)
Glucose-Capillary: 299 mg/dL — ABNORMAL HIGH (ref 70–99)

## 2022-03-24 MED ORDER — INSULIN GLARGINE 100 UNIT/ML ~~LOC~~ SOLN: 24.0000 [IU] | Freq: Every day | SUBCUTANEOUS | 11 refills | Status: DC

## 2022-03-24 MED ORDER — INSULIN ASPART 100 UNIT/ML IJ SOLN
3.0000 [IU] | Freq: Once | INTRAMUSCULAR | Status: AC
  Administered 2022-03-24: 3 [IU] via SUBCUTANEOUS

## 2022-03-24 NOTE — Discharge Summary (Signed)
Physician Discharge Summary   Patient: [name] MRN: [id number] DOB: [birth year]  Admit date:     03/19/2022  Discharge date: 03/24/22  Discharge Physician: Edwin Dada   PCP: [name], DO     Recommendations at discharge:  Follow up with PCP [name] in 1 week Dr. Zigmund Daniel: Patient reports decreased appetite he believes to metformin Patient's wife inquired about mirtazapine for appetite stimulation Please review glipizide for hypoglycemia risk     Discharge Diagnoses: Principal Problem:   Acute metabolic encephalopathy Active Problems:   DM2 (diabetes mellitus, type 2) (HCC)   Macrocytic anemia   Hypothyroidism   Benign essential HTN   CAD (coronary artery disease)   Chronic combined systolic and diastolic CHF (congestive heart failure) (HCC)   CKD (chronic kidney disease), stage III ([place])   Autoimmune myopathy   History of pulmonary embolism   Hyperlipidemia   Obesity (BMI 30-39.9)   Frequent falls   Hypoglycemia   Shoulder pain      Hospital Course: [name] is a 71 y.o. M with hx autoimmune myopathy on prednisone and methotrexate, sdCHF EF 35-40%, chronic hiccups on baclofen, DM, HLD, hypothyroidism, hypogonadism formerly on testosterone, off since submassive PE Mar 2023 requiring thrombectomy, now on Eliquis, and CKD IIIa baseline 1.2 as well as recent admission for RSV complicated by delirium who presented with a fall.  In the ER, hypoglycemic, lactic acid 3.5.  COVID negative.  CXR without active disease.  CT head NAICP.  CT lumbar and cervical spine normal.         * Acute metabolic encephalopathy At baseline, patient is interactive, appropriate.  Here, he was disoriented, confused initially.  Notably had hypoglycemia, clinical dehydration and elevated lactic acid at admission.  Suspect it was from hypoglycemia, dehydration.  No clear signs of infection (CXR clear, UA without cells, COVID negative) and CTH normal.  No localizing  signs or symptoms of infection on my exam or recognized by nursing/family.  Treated empirically with antibiotics, but cultures negative, antibiotics stopped and no fever or new localizing symptoms.   Ammonia, TSH, RPR, B12 normal.  Given spontaneous resolution, do not suspect seizures.  Possibly toxic from baclofen (for hiccups) or cefepime.     Given fluids and resolved to baseline.   History of pulmonary embolism and DVT Continued on Eliquis  Autoimmune myopathy Methotrexate held temporarily in hospital given initial concern infection, restarted at discharge.  Prednisone continued.  Shoulder pain Due to fall.  Initial 1V x-ray suggested fracture of scapula, but follow up CT of both scapulae ruled this out.  Obesity (BMI 30-39.9) BMI 30.6  CKD (chronic kidney disease), stage III (HCC) CKD stage IIIa, creatinine stable at 1.3  Chronic combined systolic and diastolic CHF (congestive heart failure) (HCC) No fluid overload  Hypothyroidism TSH normal  Macrocytic anemia Hemoglobin stable relative to baseline, no clinical bleeding  DM2 (diabetes mellitus, type 2) (HCC) Hypoglycemic on arrival.  Treated with D5W and became hyperglycemic.  Glucoses labile over the last 24 hours, mostly elevated.  Hemoglobin A1c 9%.  Would be reasonable to avoid glipizide in this elderly patient, although A1c is elevated at 9%, which is probably above even a liberal goal.         The [place] was reviewed for this patient prior to discharge.  Consultants: None Procedures performed: None  Disposition: Home health Diet recommendation:  Discharge Diet Orders (From admission, onward)     Start     Ordered  03/24/22 0000  Diet - low sodium heart healthy        03/24/22 1056             DISCHARGE MEDICATION: Allergies as of 03/24/2022       Reactions   Benazepril Hives, Cough   Gabapentin Swelling   Hydrocodone Hives, Itching   Shellfish  Allergy Diarrhea, Nausea And Vomiting        Medication List     STOP taking these medications    azithromycin 500 MG tablet Commonly known as: ZITHROMAX   cefdinir 300 MG capsule Commonly known as: OMNICEF   FreeStyle Libre 2 Sensor Misc   rOPINIRole 1 MG tablet Commonly known as: REQUIP   spironolactone 25 MG tablet Commonly known as: ALDACTONE   torsemide 10 MG tablet Commonly known as: DEMADEX       TAKE these medications    acarbose 100 MG tablet Commonly known as: PRECOSE Take 100 mg by mouth 3 (three) times daily with meals.   acetaminophen 500 MG tablet Commonly known as: TYLENOL Take 500-1,000 mg by mouth See admin instructions. Take 1000 mg by mouth in the morning and 500 mg at bedtime   apixaban 5 MG Tabs tablet Commonly known as: ELIQUIS Take 1 tablet (5 mg total) by mouth 2 (two) times daily.   atorvastatin 80 MG tablet Commonly known as: LIPITOR Take 1 tablet (80 mg total) by mouth daily. Please attend scheduled appointment for additional refills. What changed:  when to take this additional instructions   baclofen 20 MG tablet Commonly known as: LIORESAL TAKE 1 TABLET BY MOUTH TWICE A DAY   calcium carbonate 600 MG Tabs tablet Commonly known as: OS-CAL Take 600 mg by mouth daily.   carvedilol 6.25 MG tablet Commonly known as: COREG Take 1 tablet (6.25 mg total) by mouth 2 (two) times daily.   folic acid 1 MG tablet Commonly known as: FOLVITE Take 2 mg by mouth daily.   glipiZIDE 10 MG 24 hr tablet Commonly known as: GLUCOTROL XL Take 20 mg by mouth daily.   insulin glargine 100 UNIT/ML injection Commonly known as: LANTUS Inject 0.24 mLs (24 Units total) into the skin daily. What changed:  how much to take Another medication with the same name was removed. Continue taking this medication, and follow the directions you see here.   ipratropium-albuterol 0.5-2.5 (3) MG/3ML Soln Commonly known as: DUONEB Use 1 vial (12m) by  nebulization 4 (four) times daily. What changed:  when to take this reasons to take this   Levothyroxine Sodium 75 MCG Caps Take 75 mcg by mouth daily.   loratadine 10 MG tablet Commonly known as: CLARITIN Take 10 mg by mouth daily.   metFORMIN 500 MG 24 hr tablet Commonly known as: GLUCOPHAGE-XR Take 500-1,000 mg by mouth See admin instructions. Take (1,000 mg) by mouth in the morning and (500 mg) by mouth at night.   methotrexate 5 MG tablet Commonly known as: RHEUMATREX Take 2 tablets (10 mg total) by mouth once a week. Caution: Chemotherapy. Protect from light. Take on fridays per patient.  OKAY TO RESUME FROM 10/08/14. What changed:  how much to take when to take this additional instructions   pantoprazole 40 MG tablet Commonly known as: PROTONIX Take 1 tablet (40 mg total) by mouth as needed. What changed: when to take this   polyvinyl alcohol 1.4 % ophthalmic solution Commonly known as: LIQUIFILM TEARS Place 1 drop into both eyes in the morning and at bedtime.  predniSONE 10 MG tablet Commonly known as: DELTASONE Take 10 mg by mouth daily with breakfast. Take with 5 mg tablet to equal 15 mg What changed: Another medication with the same name was changed. Make sure you understand how and when to take each.   predniSONE 5 MG tablet Commonly known as: DELTASONE Please take 9 tablets ('45mg'$ ) by mouth once daily for 2 days, then 6 tablets ('30mg'$ ) once daily for 2 days, then back to 15 mg oral daily. What changed:  how much to take how to take this when to take this additional instructions   PRESCRIPTION MEDICATION Place 1 drop into both eyes in the morning and at bedtime. [facility] -- Unknown Name -- Unknown Dosing   QUEtiapine 25 MG tablet Commonly known as: SEROQUEL Take 1 tablet (25 mg total) by mouth at bedtime.   traMADol 50 MG tablet Commonly known as: ULTRAM Take 25-50 mg by mouth See admin instructions. Take one tablet (50 mg) by mouth in  the morning and half tablet (25 mg) by mouth at night as needed for pain   TUMS PO Take 3 tablets by mouth 2 (two) times daily as needed (heartburn).   vitamin B-12 100 MCG tablet Commonly known as: CYANOCOBALAMIN Take 100 mcg by mouth daily.        Follow-up Information     [name], DO. Schedule an appointment as soon as possible for a visit in 1 week(s).   Specialty: Family Medicine Contact information: [address] [place] [address] [phone]                 Discharge Instructions     Diet - low sodium heart healthy   Complete by: As directed    Discharge instructions   Complete by: As directed    **IMPORTANT DISCHARGE INSTRUCTIONS**   From Dr. Loleta Books: You were admitted for confusion The cause of this was likely low blood sugar and dehydration, combined with the pyschotropic effects of some of your medications  Thankfully, here, this resolved with fluids, correcting the blood sugar and holding baclofen until you were back to normal   Resume your home medicines as before with one exception (long acting insulin) REDUCE your long-acting insulin from 36 units to 24 units for now Check your sugars daily If your sugars are OVER 200, consistently, go back to 36 units and take your sugar log to your primary care doctor  Go see Dr. Rodena Piety your PCP in 1 week   Increase activity slowly   Complete by: As directed        Discharge Exam: Filed Weights   03/20/22 1302 03/21/22 0500 03/22/22 0500  Weight: 95.3 kg 91.5 kg 86.8 kg    General: [name] is alert, awake, not in acute distress, sitting up in recliner Cardiovascular: RRR, nl S1-S2, no murmurs appreciated.   No LE edema.   Respiratory: Normal respiratory rate and rhythm.  CTAB without rales or wheezes. Abdominal: Abdomen soft and non-tender.  No distension or HSM.   Neuro/Psych: Strength symmetric in upper and lower extremities, overall generally weak.  Judgment and  insight appear normal.   Condition at discharge: stable  The results of significant diagnostics from this hospitalization (including imaging, microbiology, ancillary and laboratory) are listed below for reference.   Imaging Studies: CT SHOULDER LEFT WO CONTRAST  Result Date: 03/22/2022 CLINICAL DATA:  Left shoulder trauma. EXAM: CT OF THE UPPER LEFT EXTREMITY WITHOUT CONTRAST TECHNIQUE: Multidetector CT imaging  of the upper left extremity was performed according to the standard protocol. RADIATION DOSE REDUCTION: This exam was performed according to the departmental dose-optimization program which includes automated exposure control, adjustment of the mA and/or kV according to patient size and/or use of iterative reconstruction technique. COMPARISON:  None Available. FINDINGS: Moderate glenohumeral and AC joint degenerative changes. No acute fracture involving the left shoulder, scapula or clavicle. No left rib fractures. Grossly by CT the rotator cuff tendons are intact. IMPRESSION: 1. Moderate glenohumeral and AC joint degenerative changes. 2. No acute fracture. 3. Grossly by CT the rotator cuff tendons are intact. Electronically Signed   By: Marijo Sanes M.D.   On: 03/22/2022 13:36   CT SHOULDER RIGHT WO CONTRAST  Result Date: 03/22/2022 CLINICAL DATA:  Right shoulder trauma. EXAM: CT OF THE UPPER RIGHT EXTREMITY WITHOUT CONTRAST TECHNIQUE: Multidetector CT imaging of the upper right extremity was performed according to the standard protocol. RADIATION DOSE REDUCTION: This exam was performed according to the departmental dose-optimization program which includes automated exposure control, adjustment of the mA and/or kV according to patient size and/or use of iterative reconstruction technique. COMPARISON:  Chest CT 07/21/2021 FINDINGS: No acute fracture is identified. There are moderate glenohumeral and AC joint degenerative changes and I suspect remote posttraumatic changes involving the  glenoid. The scapula and clavicle are intact. The right ribs are intact. The visualized right lung is grossly clear. Grossly by CT the rotator cuff tendons are intact. IMPRESSION: 1. Moderate glenohumeral and AC joint degenerative changes. Suspect remote posttraumatic changes involving the glenoid. 2. No acute fracture is identified. 3. Grossly by CT the rotator cuff tendons are intact. Electronically Signed   By: Marijo Sanes M.D.   On: 03/22/2022 13:32   DG Shoulder Right Port  Result Date: 03/22/2022 CLINICAL DATA:  Post fall EXAM: RIGHT SHOULDER - 1 VIEW COMPARISON:  None available. FINDINGS: Irregularity of the body of the scapula best seen on scapular Y-view. Shoulder is located. No additional signs of fracture. Acromioclavicular and glenohumeral degenerative changes. IMPRESSION: Irregularity of the body of the scapula best seen on scapular Y-view. Findings are suspicious for mildly displaced mid scapular fracture in the mid body of the scapula. Electronically Signed   By: Zetta Bills M.D.   On: 03/22/2022 11:08   DG ELBOW COMPLETE RIGHT (3+VIEW)  Result Date: 03/21/2022 CLINICAL DATA:  Fall.  Right elbow pain. EXAM: RIGHT ELBOW - COMPLETE 3+ VIEW COMPARISON:  None Available. FINDINGS: Normal bone mineralization. Small well corticated mineralization at the superior aspect of the olecranon, likely chronic. 2 mm tiny ossicle overlying the far medial aspect of the trochlea-coronoid articulation at the medial aspect of the elbow. Mild posterior elbow soft tissue swelling posterior to the olecranon, overlying the triceps tendon insertion. No definitive joint effusion is seen. No acute fracture is seen.  No dislocation. IMPRESSION: Mild posterior elbow soft tissue swelling, overlying the triceps tendon insertion. No acute fracture is seen. Electronically Signed   By: Yvonne Kendall M.D.   On: 03/21/2022 08:34   CT Lumbar Spine Wo Contrast  Result Date: 03/19/2022 CLINICAL DATA:  Back trauma, no  prior imaging (Age >= 16y) EXAM: CT LUMBAR SPINE WITHOUT CONTRAST TECHNIQUE: Multidetector CT imaging of the lumbar spine was performed without intravenous contrast administration. Multiplanar CT image reconstructions were also generated. RADIATION DOSE REDUCTION: This exam was performed according to the departmental dose-optimization program which includes automated exposure control, adjustment of the mA and/or kV according to patient size and/or use of iterative  reconstruction technique. COMPARISON:  MRI lumbar spine 12/05/2007 FINDINGS: Segmentation: 5 lumbar type vertebrae. Alignment: Normal. Vertebrae: Lower lumbar spine at least moderate degenerative changes. Mild multilevel osteophyte formation of the lumbar spine. L5-S1 mild posterior disc osteophyte complex formation. No severe osseous neural foraminal or central canal stenosis. No acute fracture or focal pathologic process. Paraspinal and other soft tissues: Negative. Disc levels: Intervertebral disc space vacuum phenomenon at the L5-S1 level. Other: Atherosclerotic plaque. IMPRESSION: 1. No acute displaced fracture or traumatic listhesis of the lumbar spine. 2.  Aortic Atherosclerosis (ICD10-I70.0). Electronically Signed   By: Iven Finn M.D.   On: 03/19/2022 16:25   CT Head Wo Contrast  Result Date: 03/19/2022 CLINICAL DATA:  Head trauma, minor (Age >= 65y); Neck trauma (Age >= 65y) EXAM: CT HEAD WITHOUT CONTRAST CT CERVICAL SPINE WITHOUT CONTRAST TECHNIQUE: Multidetector CT imaging of the head and cervical spine was performed following the standard protocol without intravenous contrast. Multiplanar CT image reconstructions of the cervical spine were also generated. RADIATION DOSE REDUCTION: This exam was performed according to the departmental dose-optimization program which includes automated exposure control, adjustment of the mA and/or kV according to patient size and/or use of iterative reconstruction technique. COMPARISON:  CT head  03/11/2022 FINDINGS: CT HEAD FINDINGS BRAIN: BRAIN prominence of the lateral ventricles may be related to central predominant atrophy, although a component of normal pressure/communicating hydrocephalus cannot be excluded. Patchy and confluent areas of decreased attenuation are noted throughout the deep and periventricular white matter of the cerebral hemispheres bilaterally, compatible with chronic microvascular ischemic disease. No evidence of large-territorial acute infarction. No parenchymal hemorrhage. No mass lesion. No extra-axial collection. No mass effect or midline shift. No hydrocephalus. Basilar cisterns are patent. Vascular: No hyperdense vessel. Skull: No acute fracture or focal lesion. Sinuses/Orbits: Bilateral maxillary, bilateral ethmoid, bilateral frontal mucosal thickening. Remaining visualized paranasal sinuses and mastoid air cells are clear. The orbits are unremarkable. Other: None. CT CERVICAL SPINE FINDINGS Alignment: Normal. Skull base and vertebrae: Multilevel moderate degenerative changes of the spine with associated moderate severe osseous neural foraminal stenosis bilaterally at the C5-C6 level. No severe osseous central canal stenosis. No acute fracture. No aggressive appearing focal osseous lesion or focal pathologic process. Soft tissues and spinal canal: No prevertebral fluid or swelling. No visible canal hematoma. Upper chest: Unremarkable. Other: None. IMPRESSION: 1. No acute intracranial abnormality. 2. No acute displaced fracture or traumatic listhesis of the cervical spine. 3. Sinus disease. Electronically Signed   By: Iven Finn M.D.   On: 03/19/2022 16:23   CT Cervical Spine Wo Contrast  Result Date: 03/19/2022 CLINICAL DATA:  Head trauma, minor (Age >= 65y); Neck trauma (Age >= 65y) EXAM: CT HEAD WITHOUT CONTRAST CT CERVICAL SPINE WITHOUT CONTRAST TECHNIQUE: Multidetector CT imaging of the head and cervical spine was performed following the standard protocol without  intravenous contrast. Multiplanar CT image reconstructions of the cervical spine were also generated. RADIATION DOSE REDUCTION: This exam was performed according to the departmental dose-optimization program which includes automated exposure control, adjustment of the mA and/or kV according to patient size and/or use of iterative reconstruction technique. COMPARISON:  CT head 03/11/2022 FINDINGS: CT HEAD FINDINGS BRAIN: BRAIN prominence of the lateral ventricles may be related to central predominant atrophy, although a component of normal pressure/communicating hydrocephalus cannot be excluded. Patchy and confluent areas of decreased attenuation are noted throughout the deep and periventricular white matter of the cerebral hemispheres bilaterally, compatible with chronic microvascular ischemic disease. No evidence of large-territorial acute infarction. No  parenchymal hemorrhage. No mass lesion. No extra-axial collection. No mass effect or midline shift. No hydrocephalus. Basilar cisterns are patent. Vascular: No hyperdense vessel. Skull: No acute fracture or focal lesion. Sinuses/Orbits: Bilateral maxillary, bilateral ethmoid, bilateral frontal mucosal thickening. Remaining visualized paranasal sinuses and mastoid air cells are clear. The orbits are unremarkable. Other: None. CT CERVICAL SPINE FINDINGS Alignment: Normal. Skull base and vertebrae: Multilevel moderate degenerative changes of the spine with associated moderate severe osseous neural foraminal stenosis bilaterally at the C5-C6 level. No severe osseous central canal stenosis. No acute fracture. No aggressive appearing focal osseous lesion or focal pathologic process. Soft tissues and spinal canal: No prevertebral fluid or swelling. No visible canal hematoma. Upper chest: Unremarkable. Other: None. IMPRESSION: 1. No acute intracranial abnormality. 2. No acute displaced fracture or traumatic listhesis of the cervical spine. 3. Sinus disease. Electronically  Signed   By: Iven Finn M.D.   On: 03/19/2022 16:23   DG Chest 2 View  Result Date: 03/19/2022 CLINICAL DATA:  Evaluate for pneumonia, recent fall EXAM: CHEST - 2 VIEW COMPARISON:  03/16/2022 FINDINGS: Cardiac size is within normal limits. There is poor inspiration. There are no signs of pulmonary edema or new focal infiltrates. There is no pleural effusion or pneumothorax. IMPRESSION: No active cardiopulmonary disease. Electronically Signed   By: Elmer Picker M.D.   On: 03/19/2022 16:13   DG HIP UNILAT WITH PELVIS 2-3 VIEWS RIGHT  Result Date: 03/16/2022 CLINICAL DATA:  Fall EXAM: DG HIP (WITH OR WITHOUT PELVIS) 2-3V RIGHT COMPARISON:  None Available. FINDINGS: There is no evidence of acute fracture. Alignment is normal. There is mild to moderate osteoarthritis of the hips. Degenerative changes of the lower lumbar spine, SI joints, and pubic symphysis. IMPRESSION: No evidence of acute fracture. Mild-to-moderate osteoarthritis of the hips. Electronically Signed   By: Maurine Simmering M.D.   On: 03/16/2022 08:01   DG Chest 1 View  Result Date: 03/16/2022 CLINICAL DATA:  Fall EXAM: CHEST  1 VIEW COMPARISON:  Radiograph 03/11/2022 FINDINGS: Unchanged cardiomediastinal silhouette. There is no focal airspace consolidation. There is no pleural effusion or pneumothorax. There is no acute osseous abnormality. Thoracic spondylosis. Bilateral shoulder degenerative changes. IMPRESSION: No evidence of acute cardiopulmonary disease. Electronically Signed   By: Maurine Simmering M.D.   On: 03/16/2022 07:58   CT Head Wo Contrast  Result Date: 03/16/2022 CLINICAL DATA:  Fall on blood thinners.  Neck trauma EXAM: CT HEAD WITHOUT CONTRAST CT CERVICAL SPINE WITHOUT CONTRAST TECHNIQUE: Multidetector CT imaging of the head and cervical spine was performed following the standard protocol without intravenous contrast. Multiplanar CT image reconstructions of the cervical spine were also generated. RADIATION DOSE  REDUCTION: This exam was performed according to the departmental dose-optimization program which includes automated exposure control, adjustment of the mA and/or kV according to patient size and/or use of iterative reconstruction technique. COMPARISON:  03/11/2022 FINDINGS: CT HEAD FINDINGS Brain: No evidence of acute infarction, hemorrhage, hydrocephalus, extra-axial collection or mass lesion/mass effect. Notable, generalized atrophy. Vascular: No hyperdense vessel or unexpected calcification. Skull: Normal. Negative for fracture or focal lesion. Sinuses/Orbits: No evidence of injury CT CERVICAL SPINE FINDINGS Alignment: No traumatic malalignment Skull base and vertebrae: No acute fracture Soft tissues and spinal canal: No prevertebral fluid or swelling. No visible canal hematoma. Disc levels: Generalized disc space narrowing and degenerative ridging. Upper chest: No visible injury IMPRESSION: 1. No evidence of acute intracranial or cervical spine injury. 2. Generalized brain atrophy. Electronically Signed   By: Jorje Guild  M.D.   On: 03/16/2022 07:22   CT Cervical Spine Wo Contrast  Result Date: 03/16/2022 CLINICAL DATA:  Fall on blood thinners.  Neck trauma EXAM: CT HEAD WITHOUT CONTRAST CT CERVICAL SPINE WITHOUT CONTRAST TECHNIQUE: Multidetector CT imaging of the head and cervical spine was performed following the standard protocol without intravenous contrast. Multiplanar CT image reconstructions of the cervical spine were also generated. RADIATION DOSE REDUCTION: This exam was performed according to the departmental dose-optimization program which includes automated exposure control, adjustment of the mA and/or kV according to patient size and/or use of iterative reconstruction technique. COMPARISON:  03/11/2022 FINDINGS: CT HEAD FINDINGS Brain: No evidence of acute infarction, hemorrhage, hydrocephalus, extra-axial collection or mass lesion/mass effect. Notable, generalized atrophy. Vascular: No  hyperdense vessel or unexpected calcification. Skull: Normal. Negative for fracture or focal lesion. Sinuses/Orbits: No evidence of injury CT CERVICAL SPINE FINDINGS Alignment: No traumatic malalignment Skull base and vertebrae: No acute fracture Soft tissues and spinal canal: No prevertebral fluid or swelling. No visible canal hematoma. Disc levels: Generalized disc space narrowing and degenerative ridging. Upper chest: No visible injury IMPRESSION: 1. No evidence of acute intracranial or cervical spine injury. 2. Generalized brain atrophy. Electronically Signed   By: Jorje Guild M.D.   On: 03/16/2022 07:22   ECHOCARDIOGRAM COMPLETE  Result Date: 03/15/2022    ECHOCARDIOGRAM REPORT   Patient Name:   DAKING WESTERVELT Date of Exam: 03/14/2022 Medical Rec #:  [id number]   Height:       68.0 in Accession #:    [id number]  Weight:       210.3 lb Date of Birth:  1950-08-07    BSA:          2.088 m Patient Age:    47 years    BP:           114/65 mmHg Patient Gender: M           HR:           79 bpm. Exam Location:  Inpatient Procedure: 2D Echo, Cardiac Doppler and Color Doppler Indications:    CHF-Acute Diastolic X21.19  History:        Patient has prior history of Echocardiogram examinations, most                 recent 12/20/2021. CHF, CAD and NSTEMI, Signs/Symptoms:Fatigue;                 Risk Factors:Diabetes, Hypertension and Dyslipidemia.  Sonographer:    Greer Pickerel Referring Phys: [place]  Sonographer Comments: Suboptimal parasternal window and suboptimal subcostal window. Image acquisition challenging due to patient body habitus and Image acquisition challenging due to respiratory motion. IMPRESSIONS  1. Left ventricular ejection fraction, by estimation, is 55 to 60%. The left ventricle has normal function. The left ventricle demonstrates regional wall motion abnormalities (see scoring diagram/findings for description). Left ventricular diastolic parameters are consistent with Grade I diastolic  dysfunction (impaired relaxation).  2. Right ventricular systolic function is normal. The right ventricular size is normal.  3. The mitral valve is normal in structure. No evidence of mitral valve regurgitation. No evidence of mitral stenosis.  4. The aortic valve is tricuspid. There is mild calcification of the aortic valve. There is mild thickening of the aortic valve. Aortic valve regurgitation is not visualized. Aortic valve sclerosis is present, with no evidence of aortic valve stenosis.  5. Aortic dilatation noted. There is mild dilatation of the aortic root, measuring 41 mm.  6. The inferior vena cava is normal in size with greater than 50% respiratory variability, suggesting right atrial pressure of 3 mmHg. Comparison(s): Prior images unable to be directly viewed, comparison made by report only. The left ventricular function has improved. FINDINGS  Left Ventricle: Left ventricular ejection fraction, by estimation, is 55 to 60%. The left ventricle has normal function. The left ventricle demonstrates regional wall motion abnormalities. The left ventricular internal cavity size was normal in size. There is no left ventricular hypertrophy. Left ventricular diastolic parameters are consistent with Grade I diastolic dysfunction (impaired relaxation).  LV Wall Scoring: The basal inferior segment is akinetic. Right Ventricle: The right ventricular size is normal. No increase in right ventricular wall thickness. Right ventricular systolic function is normal. Left Atrium: Left atrial size was normal in size. Right Atrium: Right atrial size was normal in size. Pericardium: There is no evidence of pericardial effusion. Mitral Valve: The mitral valve is normal in structure. No evidence of mitral valve regurgitation. No evidence of mitral valve stenosis. Tricuspid Valve: The tricuspid valve is normal in structure. Tricuspid valve regurgitation is not demonstrated. No evidence of tricuspid stenosis. Aortic Valve: The  aortic valve is tricuspid. There is mild calcification of the aortic valve. There is mild thickening of the aortic valve. Aortic valve regurgitation is not visualized. Aortic valve sclerosis is present, with no evidence of aortic valve stenosis. Pulmonic Valve: The pulmonic valve was normal in structure. Pulmonic valve regurgitation is trivial. No evidence of pulmonic stenosis. Aorta: Aortic dilatation noted. There is mild dilatation of the aortic root, measuring 41 mm. Venous: The inferior vena cava is normal in size with greater than 50% respiratory variability, suggesting right atrial pressure of 3 mmHg. IAS/Shunts: No atrial level shunt detected by color flow Doppler.  LEFT VENTRICLE PLAX 2D LVOT diam:     2.00 cm     Diastology LV SV:         63          LV e' medial:    6.20 cm/s LV SV Index:   30          LV E/e' medial:  10.4 LVOT Area:     3.14 cm    LV e' lateral:   5.66 cm/s                            LV E/e' lateral: 11.4  LV Volumes (MOD) LV vol d, MOD A2C: 89.0 ml LV vol d, MOD A4C: 82.5 ml LV vol s, MOD A2C: 47.5 ml LV vol s, MOD A4C: 47.6 ml LV SV MOD A2C:     41.5 ml LV SV MOD A4C:     82.5 ml LV SV MOD BP:      37.2 ml RIGHT VENTRICLE RV S prime:     9.90 cm/s TAPSE (M-mode): 2.1 cm LEFT ATRIUM             Index        RIGHT ATRIUM          Index LA Vol (A2C):   52.5 ml 25.14 ml/m  RA Area:     7.88 cm LA Vol (A4C):   56.9 ml 27.25 ml/m  RA Volume:   15.30 ml 7.33 ml/m LA Biplane Vol: 60.7 ml 29.07 ml/m  AORTIC VALVE             PULMONIC VALVE LVOT Vmax:   76.60 cm/s  PR End  Diast Vel: 3.70 msec LVOT Vmean:  52.900 cm/s LVOT VTI:    0.201 m  AORTA Ao Root diam: 4.10 cm Ao Asc diam:  3.90 cm MITRAL VALVE MV Area (PHT): 3.31 cm    SHUNTS MV Decel Time: 229 msec    Systemic VTI:  0.20 m MV E velocity: 64.50 cm/s  Systemic Diam: 2.00 cm Candee Furbish MD Electronically signed by Candee Furbish MD Signature Date/Time: 03/15/2022/6:45:29 AM    Final    CT HEAD WO CONTRAST  Result Date:  03/11/2022 CLINICAL DATA:  Altered mental status. EXAM: CT HEAD WITHOUT CONTRAST TECHNIQUE: Contiguous axial images were obtained from the base of the skull through the vertex without intravenous contrast. RADIATION DOSE REDUCTION: This exam was performed according to the departmental dose-optimization program which includes automated exposure control, adjustment of the mA and/or kV according to patient size and/or use of iterative reconstruction technique. COMPARISON:  07/25/2021 and MRI from 08/13/2021 FINDINGS: Brain: The brainstem, cerebellum, cerebral peduncles, thalami, basal ganglia, basilar cisterns, and ventricular system appear within normal limits. No intracranial hemorrhage, mass lesion, or acute CVA. Periventricular white matter and corona radiata hypodensities favor chronic ischemic microvascular white matter disease. Vascular: There is atherosclerotic calcification of the cavernous carotid arteries bilaterally. Skull: Unremarkable Sinuses/Orbits: Chronic bilateral maxillary, ethmoid, and frontal sinusitis. There is potentially a small air-fluid level in the right frontal sinus such that superimposed mild acute right frontal sinusitis is not excluded. Other: No supplemental non-categorized findings. IMPRESSION: 1. No acute intracranial findings. 2. Periventricular white matter and corona radiata hypodensities favor chronic ischemic microvascular white matter disease. 3. Chronic bilateral maxillary, ethmoid, and frontal sinusitis. There is potentially a small air-fluid level in the right frontal sinus such that superimposed mild acute right frontal sinusitis is not excluded. 4. Atherosclerosis. Electronically Signed   By: Van Clines M.D.   On: 03/11/2022 12:14   DG Chest Port 1 View  Result Date: 03/11/2022 CLINICAL DATA:  71 year old male with history of altered mental status. EXAM: PORTABLE CHEST 1 VIEW COMPARISON:  Chest x-ray 07/24/2021. FINDINGS: Lung volumes are low. Diffuse  interstitial prominence and peribronchial cuffing, most evident throughout the left lung, particularly in the left lung base. Right lung appears relatively clear. No pleural effusions. No pneumothorax. No evidence of pulmonary edema. Heart size is borderline enlarged. Upper mediastinal contours are within normal limits. IMPRESSION: 1. Mild diffuse interstitial prominence and peribronchial cuffing, most severe in the left lung, particularly at the left lung base. Findings are concerning for bronchitis and probable developing left basilar bronchopneumonia. Electronically Signed   By: [name] M.D.   On: 03/11/2022 11:26    Microbiology: Results for orders placed or performed during the hospital encounter of 03/19/22  Blood culture (routine x 2)     Status: None (Preliminary result)   Collection Time: 03/19/22 10:11 PM   Specimen: BLOOD  Result Value Ref Range Status   Specimen Description   Final    BLOOD BLOOD RIGHT HAND Performed at [facility] [address] [place], [place] [address]    Special Requests   Final    BOTTLES DRAWN AEROBIC AND ANAEROBIC Blood Culture adequate volume Performed at [facility] [address] [place], [place] [address]    Culture   Final    NO GROWTH 4 DAYS Performed at [facility], [place] [address], [place], [place] [address]    Report Status PENDING  Incomplete  Blood culture (routine x 2)  Status: None (Preliminary result)   Collection Time: 03/19/22 10:44 PM   Specimen: BLOOD  Result Value Ref Range Status   Specimen Description   Final    BLOOD LEFT ANTECUBITAL Performed at [facility] [address] [place], [place] [address]    Special Requests   Final    BOTTLES DRAWN AEROBIC AND ANAEROBIC Blood Culture adequate volume Performed at [facility] [address], [place], [place] [address]    Culture   Final    NO GROWTH 4 DAYS Performed at [facility], [place] [address], [place], [place] [address]    Report Status PENDING  Incomplete  MRSA Next Gen by PCR, Nasal     Status: None   Collection Time: 03/19/22 11:41 PM   Specimen: Nasal Mucosa; Nasal Swab  Result Value Ref Range Status   MRSA by PCR Next Gen NOT DETECTED NOT DETECTED Final    Comment: (NOTE) The GeneXpert MRSA Assay (FDA approved for NASAL specimens only), is one component of a comprehensive MRSA colonization surveillance program. It is not intended to diagnose MRSA infection nor to guide or monitor treatment for MRSA infections. Test performance is not FDA approved in patients less than 39 years old. Performed at [facility], [place] [address], [place], [place] [address]   SARS Coronavirus 2 by RT PCR (hospital order, performed in [facility] [place] [facility] lab) *cepheid single result test* Anterior Nasal Swab     Status: None   Collection Time: 03/20/22  1:14 AM   Specimen: Anterior Nasal Swab  Result Value Ref Range Status   SARS Coronavirus 2 by RT PCR NEGATIVE NEGATIVE Final    Comment: (NOTE) SARS-CoV-2 target nucleic acids are NOT DETECTED.  The SARS-CoV-2 RNA is generally detectable in upper and lower respiratory specimens during the acute phase of infection. The lowest concentration of SARS-CoV-2 viral copies this assay can detect is 250 copies / mL. A negative result does not preclude SARS-CoV-2 infection and should not be used as the sole basis for treatment or other patient management decisions.  A negative result may occur with improper specimen collection / handling, submission of specimen other than nasopharyngeal swab, presence of viral mutation(s) within the areas targeted by this assay, and inadequate number of viral copies (<250 copies / mL). A negative result must be combined with clinical observations, patient history, and epidemiological information.  Fact Sheet for Patients:    https://www.patel.info/  Fact Sheet for Healthcare Providers: https://hall.com/  This test is not yet approved or  cleared by the [place] FDA and has been authorized for detection and/or diagnosis of SARS-CoV-2 by FDA under an Emergency Use Authorization (EUA).  This EUA will remain in effect (meaning this test can be used) for the duration of the COVID-19 declaration under Section 564(b)(1) of the Act, 21 U.S.C. section 360bbb-3(b)(1), unless the authorization is terminated or revoked sooner.  Performed at [facility], [place] [address] [place], [place] [address]     Labs: CBC: Recent Labs  Lab 03/19/22 1548 03/20/22 0457 03/20/22 1038 03/21/22 0828 03/22/22 0401 03/23/22 0818 03/23/22 2117 03/24/22 1028  WBC 9.6 8.7  --  9.5 7.3 5.8 7.1 7.5  NEUTROABS 7.2 6.3  --   --   --   --  5.5  --   HGB 10.4* 9.0*   < > 8.8* 9.1* 8.9* 8.7* 10.0*  HCT 32.4* 28.4*   < > 27.3* 28.6* 27.9* 27.5* 32.3*  MCV 98.8 99.0  --  98.9 98.3 98.2 99.6 101.6*  PLT 264 230  --  208 220 225 232 255   < > = values in this interval not displayed.   Basic Metabolic Panel: Recent Labs  Lab 03/20/22 0114 03/20/22 1038 03/21/22 0828 03/22/22 0401 03/23/22 0818 03/23/22 1842 03/23/22 2117 03/24/22 1028  NA  --  143 144 145 139  --  134* 139  K  --  5.3* 4.4 3.9 3.8  --  4.2 3.8  CL  --  111 113* 112* 107  --  104 106  CO2  --  '27 23 23 24  '$ --  22 21*  GLUCOSE  --  53* 251* 173* 279* 541* 413* 335*  BUN  --  32* 26* 20 18  --  20 17  CREATININE  --  1.23 1.29* 1.17 1.41*  --  1.53* 1.36*  CALCIUM  --  9.0 9.2 9.7 9.1  --  8.7* 8.9  MG 2.5*  --   --   --   --   --  2.1  --   PHOS  --  4.0  --   --   --   --   --   --    Liver Function Tests: Recent Labs  Lab 03/19/22 1548 03/21/22 0828 03/22/22 0401 03/23/22 0818  AST 26 29 45* 25  ALT 32 29 35 28  ALKPHOS 54 50 52 46  BILITOT 0.8 1.5* 1.6* 1.4*  PROT 7.0 6.2* 6.0*  5.8*  ALBUMIN 3.9 3.4* 3.3* 2.9*   CBG: Recent Labs  Lab 03/23/22 2122 03/24/22 0159 03/24/22 0614 03/24/22 0806 03/24/22 1240  GLUCAP 350* 272* 212* 233* 299*    Discharge time spent: approximately 35 minutes spent on discharge counseling, evaluation of patient on day of discharge, and coordination of discharge planning with nursing, social work, pharmacy and case management  Signed: Edwin Dada, MD [facility] 03/24/2022

## 2022-03-24 NOTE — Progress Notes (Signed)
Mobility Specialist - Progress Note   03/24/22 1100  Mobility  Activity Ambulated with assistance in room  Level of Assistance Standby assist, set-up cues, supervision of patient - no hands on  Assistive Device Front wheel walker  Distance Ambulated (ft) 10 ft  Range of Motion/Exercises Active  Activity Response Tolerated well  Mobility Referral Yes  $Mobility charge 1 Mobility   Pt was found in bathroom and was aided in ambulating to recliner chair. Had no complaints and was left sitting on recliner chair with family in room.  Ferd Hibbs Mobility Specialist

## 2022-03-25 LAB — CULTURE, BLOOD (ROUTINE X 2)
Culture: NO GROWTH
Culture: NO GROWTH
Special Requests: ADEQUATE
Special Requests: ADEQUATE

## 2022-03-26 ENCOUNTER — Telehealth: Payer: Self-pay | Admitting: General Practice

## 2022-03-26 DIAGNOSIS — I130 Hypertensive heart and chronic kidney disease with heart failure and stage 1 through stage 4 chronic kidney disease, or unspecified chronic kidney disease: Secondary | ICD-10-CM | POA: Diagnosis not present

## 2022-03-26 DIAGNOSIS — F32A Depression, unspecified: Secondary | ICD-10-CM | POA: Diagnosis not present

## 2022-03-26 DIAGNOSIS — Z792 Long term (current) use of antibiotics: Secondary | ICD-10-CM | POA: Diagnosis not present

## 2022-03-26 DIAGNOSIS — E039 Hypothyroidism, unspecified: Secondary | ICD-10-CM | POA: Diagnosis not present

## 2022-03-26 DIAGNOSIS — Z86718 Personal history of other venous thrombosis and embolism: Secondary | ICD-10-CM | POA: Diagnosis not present

## 2022-03-26 DIAGNOSIS — Z86711 Personal history of pulmonary embolism: Secondary | ICD-10-CM | POA: Diagnosis not present

## 2022-03-26 DIAGNOSIS — M21371 Foot drop, right foot: Secondary | ICD-10-CM | POA: Diagnosis not present

## 2022-03-26 DIAGNOSIS — Z7984 Long term (current) use of oral hypoglycemic drugs: Secondary | ICD-10-CM | POA: Diagnosis not present

## 2022-03-26 DIAGNOSIS — Z7952 Long term (current) use of systemic steroids: Secondary | ICD-10-CM | POA: Diagnosis not present

## 2022-03-26 DIAGNOSIS — J121 Respiratory syncytial virus pneumonia: Secondary | ICD-10-CM | POA: Diagnosis not present

## 2022-03-26 DIAGNOSIS — H409 Unspecified glaucoma: Secondary | ICD-10-CM | POA: Diagnosis not present

## 2022-03-26 DIAGNOSIS — F419 Anxiety disorder, unspecified: Secondary | ICD-10-CM | POA: Diagnosis not present

## 2022-03-26 DIAGNOSIS — E1136 Type 2 diabetes mellitus with diabetic cataract: Secondary | ICD-10-CM | POA: Diagnosis not present

## 2022-03-26 DIAGNOSIS — I252 Old myocardial infarction: Secondary | ICD-10-CM | POA: Diagnosis not present

## 2022-03-26 DIAGNOSIS — I2510 Atherosclerotic heart disease of native coronary artery without angina pectoris: Secondary | ICD-10-CM | POA: Diagnosis not present

## 2022-03-26 DIAGNOSIS — Z79891 Long term (current) use of opiate analgesic: Secondary | ICD-10-CM | POA: Diagnosis not present

## 2022-03-26 DIAGNOSIS — G729 Myopathy, unspecified: Secondary | ICD-10-CM | POA: Diagnosis not present

## 2022-03-26 DIAGNOSIS — D631 Anemia in chronic kidney disease: Secondary | ICD-10-CM | POA: Diagnosis not present

## 2022-03-26 DIAGNOSIS — K219 Gastro-esophageal reflux disease without esophagitis: Secondary | ICD-10-CM | POA: Diagnosis not present

## 2022-03-26 DIAGNOSIS — K589 Irritable bowel syndrome without diarrhea: Secondary | ICD-10-CM | POA: Diagnosis not present

## 2022-03-26 DIAGNOSIS — I5030 Unspecified diastolic (congestive) heart failure: Secondary | ICD-10-CM | POA: Diagnosis not present

## 2022-03-26 DIAGNOSIS — M545 Low back pain, unspecified: Secondary | ICD-10-CM | POA: Diagnosis not present

## 2022-03-26 DIAGNOSIS — E1122 Type 2 diabetes mellitus with diabetic chronic kidney disease: Secondary | ICD-10-CM | POA: Diagnosis not present

## 2022-03-26 DIAGNOSIS — N1831 Chronic kidney disease, stage 3a: Secondary | ICD-10-CM | POA: Diagnosis not present

## 2022-03-26 DIAGNOSIS — G8929 Other chronic pain: Secondary | ICD-10-CM | POA: Diagnosis not present

## 2022-03-26 NOTE — Telephone Encounter (Signed)
Transition Care Management Unsuccessful Follow-up Telephone Call  Date of discharge and from where:  03/24/22 from [facility]   Attempts:  1st Attempt  Reason for unsuccessful TCM follow-up call:  Left voice message

## 2022-03-28 NOTE — Telephone Encounter (Signed)
Transition Care Management Unsuccessful Follow-up Telephone Call  Date of discharge and from where:  03/24/22 from [facility]  Attempts:  2nd Attempt  Reason for unsuccessful TCM follow-up call:  Left voice message

## 2022-03-29 ENCOUNTER — Ambulatory Visit (INDEPENDENT_AMBULATORY_CARE_PROVIDER_SITE_OTHER): Payer: Medicare Other

## 2022-03-29 ENCOUNTER — Encounter: Payer: Self-pay | Admitting: Family Medicine

## 2022-03-29 ENCOUNTER — Ambulatory Visit (INDEPENDENT_AMBULATORY_CARE_PROVIDER_SITE_OTHER): Payer: Medicare Other | Admitting: Family Medicine

## 2022-03-29 VITALS — BP 143/80 | HR 88 | Ht 68.0 in | Wt 206.0 lb

## 2022-03-29 DIAGNOSIS — R296 Repeated falls: Secondary | ICD-10-CM | POA: Diagnosis not present

## 2022-03-29 DIAGNOSIS — F32A Depression, unspecified: Secondary | ICD-10-CM

## 2022-03-29 DIAGNOSIS — I10 Essential (primary) hypertension: Secondary | ICD-10-CM | POA: Diagnosis not present

## 2022-03-29 DIAGNOSIS — Z794 Long term (current) use of insulin: Secondary | ICD-10-CM | POA: Diagnosis not present

## 2022-03-29 DIAGNOSIS — R5383 Other fatigue: Secondary | ICD-10-CM

## 2022-03-29 DIAGNOSIS — Z8701 Personal history of pneumonia (recurrent): Secondary | ICD-10-CM

## 2022-03-29 DIAGNOSIS — R410 Disorientation, unspecified: Secondary | ICD-10-CM

## 2022-03-29 DIAGNOSIS — J121 Respiratory syncytial virus pneumonia: Secondary | ICD-10-CM | POA: Diagnosis not present

## 2022-03-29 DIAGNOSIS — G7249 Other inflammatory and immune myopathies, not elsewhere classified: Secondary | ICD-10-CM | POA: Diagnosis not present

## 2022-03-29 DIAGNOSIS — I5042 Chronic combined systolic (congestive) and diastolic (congestive) heart failure: Secondary | ICD-10-CM

## 2022-03-29 DIAGNOSIS — E1165 Type 2 diabetes mellitus with hyperglycemia: Secondary | ICD-10-CM | POA: Diagnosis not present

## 2022-03-29 MED ORDER — TRAMADOL-ACETAMINOPHEN 37.5-325 MG PO TABS: 1.0000 | ORAL_TABLET | Freq: Three times a day (TID) | ORAL | 1 refills | Status: DC | PRN

## 2022-03-29 MED ORDER — ALPRAZOLAM 0.25 MG PO TABS: 0.1250 mg | ORAL_TABLET | Freq: Two times a day (BID) | ORAL | 0 refills | Status: DC | PRN

## 2022-03-29 NOTE — Patient Instructions (Addendum)
Take torsemide '10mg'$   and spironolactone daily for swelling Start sertraline '25mg'$  (1/2 tab) x2 weeks then increase to '50mg'$ .   Alprazolam 1/2 tab as needed for increased anxiety.  Let's try ultracet to replace tramadol for pain.  Let me know if home health doesn't come out soon.   Follow up with cardiology and endocrinology.

## 2022-03-30 LAB — CBC WITH DIFFERENTIAL/PLATELET
Absolute Monocytes: 371 cells/uL (ref 200–950)
Basophils Absolute: 12 cells/uL (ref 0–200)
Basophils Relative: 0.2 %
Eosinophils Absolute: 17 cells/uL (ref 15–500)
Eosinophils Relative: 0.3 %
HCT: 29.1 % — ABNORMAL LOW (ref 38.5–50.0)
Hemoglobin: 9.8 g/dL — ABNORMAL LOW (ref 13.2–17.1)
Lymphs Abs: 806 cells/uL — ABNORMAL LOW (ref 850–3900)
MCH: 31.6 pg (ref 27.0–33.0)
MCHC: 33.7 g/dL (ref 32.0–36.0)
MCV: 93.9 fL (ref 80.0–100.0)
MPV: 11 fL (ref 7.5–12.5)
Monocytes Relative: 6.4 %
Neutro Abs: 4594 cells/uL (ref 1500–7800)
Neutrophils Relative %: 79.2 %
Platelets: 309 10*3/uL (ref 140–400)
RBC: 3.1 10*6/uL — ABNORMAL LOW (ref 4.20–5.80)
RDW: 14.4 % (ref 11.0–15.0)
Total Lymphocyte: 13.9 %
WBC: 5.8 10*3/uL (ref 3.8–10.8)

## 2022-03-30 LAB — URINE CULTURE
MICRO NUMBER:: 14254011
Result:: NO GROWTH
SPECIMEN QUALITY:: ADEQUATE

## 2022-03-30 LAB — URINALYSIS, ROUTINE W REFLEX MICROSCOPIC
Bacteria, UA: NONE SEEN /HPF
Bilirubin Urine: NEGATIVE
Hgb urine dipstick: NEGATIVE
Ketones, ur: NEGATIVE
Leukocytes,Ua: NEGATIVE
Nitrite: NEGATIVE
RBC / HPF: NONE SEEN /HPF (ref 0–2)
Specific Gravity, Urine: 1.017 (ref 1.001–1.035)
Squamous Epithelial / HPF: NONE SEEN /HPF (ref <=5)
WBC, UA: NONE SEEN /HPF (ref 0–5)
pH: <=5 (ref 5.0–8.0)

## 2022-03-30 LAB — COMPLETE METABOLIC PANEL WITH GFR
AG Ratio: 1.7 (calc) (ref 1.0–2.5)
ALT: 37 U/L (ref 9–46)
AST: 23 U/L (ref 10–35)
Albumin: 4 g/dL (ref 3.6–5.1)
Alkaline phosphatase (APISO): 62 U/L (ref 35–144)
BUN/Creatinine Ratio: 16 (calc) (ref 6–22)
BUN: 22 mg/dL (ref 7–25)
CO2: 20 mmol/L (ref 20–32)
Calcium: 10 mg/dL (ref 8.6–10.3)
Chloride: 106 mmol/L (ref 98–110)
Creat: 1.34 mg/dL — ABNORMAL HIGH (ref 0.70–1.28)
Globulin: 2.3 g/dL (calc) (ref 1.9–3.7)
Glucose, Bld: 280 mg/dL — ABNORMAL HIGH (ref 65–99)
Potassium: 5.4 mmol/L — ABNORMAL HIGH (ref 3.5–5.3)
Sodium: 138 mmol/L (ref 135–146)
Total Bilirubin: 0.6 mg/dL (ref 0.2–1.2)
Total Protein: 6.3 g/dL (ref 6.1–8.1)
eGFR: 56 mL/min/{1.73_m2} — ABNORMAL LOW (ref >=60)

## 2022-03-30 LAB — MICROSCOPIC MESSAGE

## 2022-03-30 LAB — B12 AND FOLATE PANEL
Folate: >24 ng/mL
Vitamin B-12: 579 pg/mL (ref 200–1100)

## 2022-03-30 LAB — MAGNESIUM: Magnesium: 2.2 mg/dL (ref 1.5–2.5)

## 2022-04-01 NOTE — Assessment & Plan Note (Signed)
Multiple falls related to combination of dehydration, hypoglycemia as well as deconditioning from initial hospitalization.  Updating labs today.  Instructed to follow-up with specialist including cardiology for continued adjustment of his cardiac medications.  He does have home health ordered, however they will let me know if they are not contacted over the next few days.

## 2022-04-01 NOTE — Assessment & Plan Note (Signed)
His torsemide and spironolactone will be held at last hospitalization due to dehydration and falls.  He is now having increased swelling in his lower extremities.  Adding torsemide back on.  Return in 2 weeks.

## 2022-04-01 NOTE — Progress Notes (Signed)
[name] - 71 y.o. male MRN [id number]  Date of birth: [birth year]  Subjective Chief Complaint  Patient presents with   Hospitalization Follow-up    HPI [name] is a 71 y.o. male here today for hospital follow up.    He was initially admitted on 03/11/2022 for altered mental status.  Found to you.  Respiratory virus panel returned positive for RSV.  Treated with IV antibiotics with course of cefdinir and azithromycin to complete this at discharge.  Additionally he was treated for CHF.  Received IV diuresis initially.  His Entresto was held due to low blood pressure.  Aldactone and furosemide were continued at discharge.  Instructed to follow-up with cardiology.  He was supposed to have follow-up however he suffered a fall at home prior to seeing cardiology.  Seen in the ED on 03/16/2022 for fall.  Once again had fall on 03/19/2022 and was readmitted.  He was noted to be hypoglycemic with slightly elevated lactic acid.  Chest x-ray negative.  Also appeared to be clinically dehydrated.  Glipizide was held due to his hypoglycemia.  Instructed to hold torsemide and Aldactone.  Since discharge from the hospital his appetite has not been very good.  Additionally he has felt more anxious and emotional.  He finds himself crying randomly at times for any reason.  Does have prior history of PTSD and depression.  He is still weak and unsteady on his feet.  Home health has been ordered but has not come out yet.  He has noted increased swelling in his lower extremities.  Denies increased dyspnea.  Reports increased pain as well due to holding tramadol as this seemed to be worsening his mental status previously.  ROS:  A comprehensive ROS was completed and negative except as noted per HPI    Allergies  Allergen Reactions   Benazepril Hives and Cough   Gabapentin Swelling   Hydrocodone Hives and Itching   Shellfish Allergy Diarrhea and Nausea And Vomiting    Past Medical History:  Diagnosis Date    Autoimmune disease ([place])    Bronchitis    Chronic hiccups    Diabetes mellitus without complication (HCC)    DM2 (diabetes mellitus, type 2) ([place]) 09/28/2014   GERD (gastroesophageal reflux disease)    Hypogonadism in male 04/01/2018   Hypothyroidism    Joint pain    Necrotizing myopathy    Posttraumatic stress disorder 01/30/2022   PTSD (post-traumatic stress disorder)    Rheumatoid arthritis ([place]) 08/09/2021   RSV (respiratory syncytial virus pneumonia) 03/11/2022    Past Surgical History:  Procedure Laterality Date   BIOPSY SHOULDER     Left   IR ANGIOGRAM PULMONARY BILATERAL SELECTIVE  07/22/2021   IR THROMBECT VENO MECH MOD SED  07/22/2021   IR US GUIDE VASC ACCESS LEFT  07/22/2021   RADIOLOGY WITH ANESTHESIA Right 07/22/2021   Procedure: IR WITH ANESTHESIA;  Surgeon: Radiologist, Medication, MD;  Location: [facility];  Service: Radiology;  Laterality: Right;   UPPER LEG SOFT TISSUE BIOPSY Right     Social History   Socioeconomic History   Marital status: Married    Spouse name: Not on file   Number of children: 4   Years of education: 16   Highest education level: Not on file  Occupational History   Occupation: Retired  Tobacco Use   Smoking status: Never   Smokeless tobacco: Never  Vaping Use   Vaping Use: Never used  Substance and Sexual Activity   Alcohol use:  No   Drug use: No   Sexual activity: Not on file  Other Topics Concern   Not on file  Social History Narrative   ** Merged History Encounter **       Lives at home with his wife and son. Right-handed. 2 cups caffeine per day.   Social Determinants of Health   Financial Resource Strain: Not on file  Food Insecurity: Not on file  Transportation Needs: Not on file  Physical Activity: Not on file  Stress: Not on file  Social Connections: Not on file    Family History  Problem Relation Age of Onset   Hypertension Mother    Diabetes Mother    Arthritis Mother    Depression Mother    Heart attack  Father    Hypertension Father     Health Maintenance  Topic Date Due   DTaP/Tdap/Td (2 - Td or Tdap) 08/19/2021   OPHTHALMOLOGY EXAM  03/09/2022   Diabetic kidney evaluation - Urine ACR  05/31/2022 (Originally 03/15/2019)   COVID-19 Vaccine (6 - 2023-24 season) 05/31/2022 (Originally 12/29/2021)   Medicare Annual Wellness (AWV)  05/31/2022 (Originally 24-Jan-1951)   Zoster Vaccines- Shingrix (1 of 2) 06/28/2022 (Originally 10/29/1967)   COLONOSCOPY (Pts 45-36yr Insurance coverage will need to be confirmed)  08/10/2022 (Originally 10/28/1993)   HEMOGLOBIN A1C  09/09/2022   FOOT EXAM  11/15/2022   Diabetic kidney evaluation - GFR measurement  03/30/2023   Pneumonia Vaccine 71 Years old  Completed   INFLUENZA VACCINE  Completed   Hepatitis C Screening  Completed   HPV VACCINES  Aged Out     ----------------------------------------------------------------------------------------------------------------------------------------------------------------------------------------------------------------- Physical Exam BP (!) 143/80 (BP Location: Left Arm, Patient Position: Sitting, Cuff Size: Normal)   Pulse 88   Ht '5\' 8"'$  (1.727 m)   Wt 206 lb (93.4 kg)   SpO2 99%   BMI 31.32 kg/m   Physical Exam Constitutional:      Appearance: Normal appearance.  HENT:     Head: Normocephalic and atraumatic.  Eyes:     General: No scleral icterus. Cardiovascular:     Rate and Rhythm: Normal rate and regular rhythm.     Comments: Bilateral lower extremity edema 1+ bilaterally. Pulmonary:     Effort: Pulmonary effort is normal.     Breath sounds: Normal breath sounds.  Musculoskeletal:     Cervical back: Neck supple.  Neurological:     Mental Status: He is alert.  Psychiatric:     Comments: Anxious and tearful.      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  Tramadol at current strength feels to be too strong for him at this time.  Switching to Ultracet which will give him a reduced amount of tramadol.  They will let me know if this is not tolerated well.  Chronic combined systolic and diastolic CHF (congestive heart failure) (HCC) His torsemide and spironolactone will be held at last hospitalization due to dehydration and falls.  He is now having increased swelling in his lower extremities.  Adding torsemide back on.  Return in 2 weeks.  RSV (respiratory syncytial virus pneumonia) Complete course of antibiotics.  Still some fatigue.  Updating chest x-ray today.  DM2 (diabetes mellitus, type 2) ([place] He did have hypoglycemia upon last admission.  Glipizide was held.  Continue Lantus at current strength and monitor glucose closely at home.  Frequent falls Multiple falls related to combination of dehydration, hypoglycemia as well as  deconditioning from initial hospitalization.  Updating labs today.  Instructed to follow-up with specialist including cardiology for continued adjustment of his cardiac medications.  He does have home health ordered, however they will let me know if they are not contacted over the next few days.  Depression Increasing depression symptoms.  Sertraline was held previously.  Starting back at 25 mg x 2 weeks then increase to 50 mg.  Very low-dose alprazolam as needed for severe breakthrough anxiety added.  He understands this is not for long-term use.   Meds ordered this encounter  Medications   traMADol-acetaminophen (ULTRACET) 37.5-325 MG tablet    Sig: Take 1 tablet by mouth every 8 (eight) hours as needed.     Dispense:  30 tablet    Refill:  1   ALPRAZolam (XANAX) 0.25 MG tablet    Sig: Take 0.5-1 tablets (0.125-0.25 mg total) by mouth 2 (two) times daily as needed for anxiety.    Dispense:  20 tablet    Refill:  0    Return in about 2 weeks (around 04/12/2022) for F/u from 03/29/22 visit.    This visit occurred during the SARS-CoV-2 public health emergency.  Safety protocols were in place, including screening questions prior to the visit, additional usage of staff PPE, and extensive cleaning of exam room while observing appropriate contact time as indicated for disinfecting solutions.

## 2022-04-01 NOTE — Assessment & Plan Note (Signed)
He did have hypoglycemia upon last admission.  Glipizide was held.  Continue Lantus at current strength and monitor glucose closely at home.

## 2022-04-01 NOTE — Assessment & Plan Note (Signed)
Increasing depression symptoms.  Sertraline was held previously.  Starting back at 25 mg x 2 weeks then increase to 50 mg.  Very low-dose alprazolam as needed for severe breakthrough anxiety added.  He understands this is not for long-term use.

## 2022-04-01 NOTE — Assessment & Plan Note (Signed)
He has chronic pain related to chronic myopathy and RA.  Tramadol at current strength feels to be too strong for him at this time.  Switching to Ultracet which will give him a reduced amount of tramadol.  They will let me know if this is not tolerated well.

## 2022-04-01 NOTE — Assessment & Plan Note (Signed)
Complete course of antibiotics.  Still some fatigue.  Updating chest x-ray today.

## 2022-04-02 NOTE — Telephone Encounter (Signed)
Transition Care Management Unsuccessful Follow-up Telephone Call  Date of discharge and from where:  03/24/22 from [facility]  Attempts:  3rd Attempt  Reason for unsuccessful TCM follow-up call:  Left voice message

## 2022-04-04 DIAGNOSIS — D631 Anemia in chronic kidney disease: Secondary | ICD-10-CM | POA: Diagnosis not present

## 2022-04-04 DIAGNOSIS — I5030 Unspecified diastolic (congestive) heart failure: Secondary | ICD-10-CM | POA: Diagnosis not present

## 2022-04-04 DIAGNOSIS — I130 Hypertensive heart and chronic kidney disease with heart failure and stage 1 through stage 4 chronic kidney disease, or unspecified chronic kidney disease: Secondary | ICD-10-CM | POA: Diagnosis not present

## 2022-04-04 DIAGNOSIS — J121 Respiratory syncytial virus pneumonia: Secondary | ICD-10-CM | POA: Diagnosis not present

## 2022-04-04 DIAGNOSIS — N1831 Chronic kidney disease, stage 3a: Secondary | ICD-10-CM | POA: Diagnosis not present

## 2022-04-04 DIAGNOSIS — E1122 Type 2 diabetes mellitus with diabetic chronic kidney disease: Secondary | ICD-10-CM | POA: Diagnosis not present

## 2022-04-06 ENCOUNTER — Encounter (HOSPITAL_BASED_OUTPATIENT_CLINIC_OR_DEPARTMENT_OTHER): Payer: Self-pay | Admitting: Family

## 2022-04-06 ENCOUNTER — Ambulatory Visit (INDEPENDENT_AMBULATORY_CARE_PROVIDER_SITE_OTHER): Payer: Medicare Other | Admitting: Family

## 2022-04-06 ENCOUNTER — Telehealth: Payer: Self-pay

## 2022-04-06 VITALS — BP 136/79 | HR 93 | Ht 68.0 in | Wt 210.2 lb

## 2022-04-06 DIAGNOSIS — R600 Localized edema: Secondary | ICD-10-CM | POA: Diagnosis not present

## 2022-04-06 DIAGNOSIS — D6859 Other primary thrombophilia: Secondary | ICD-10-CM | POA: Diagnosis not present

## 2022-04-06 DIAGNOSIS — I5032 Chronic diastolic (congestive) heart failure: Secondary | ICD-10-CM | POA: Diagnosis not present

## 2022-04-06 DIAGNOSIS — F32A Depression, unspecified: Secondary | ICD-10-CM

## 2022-04-06 DIAGNOSIS — E875 Hyperkalemia: Secondary | ICD-10-CM | POA: Diagnosis not present

## 2022-04-06 MED ORDER — SERTRALINE HCL 25 MG PO TABS: ORAL_TABLET | ORAL | 0 refills | Status: DC

## 2022-04-06 MED ORDER — TORSEMIDE 20 MG PO TABS: 20.0000 mg | ORAL_TABLET | Freq: Every day | ORAL | 1 refills | Status: DC

## 2022-04-06 NOTE — Telephone Encounter (Signed)
Certification for Home Health completed by Dr. Zigmund Daniel.   Faxed with confirmation.   Front Desk: Documents given for scan and for billing.

## 2022-04-06 NOTE — Patient Instructions (Addendum)
Medication Instructions:  Your physician has recommended you make the following change in your medication:   INCREASE Torsemide to '20mg'$  daily  Continue Setraline '25mg'$  daily for one more week then increase to '50mg'$ . We have sent a one week supply of '25mg'$  tablets to your pharmacy. After that, you may take half of a '100mg'$  tablet.   *If you need a refill on your cardiac medications before your next appointment, please call your pharmacy*   Lab Work: Your physician recommends that you return for lab work today: BMP, BNP  If you have labs (blood work) drawn today and your tests are completely normal, you will receive your results only by: MyChart Message (if you have MyChart) OR A paper copy in the mail If you have any lab test that is abnormal or we need to change your treatment, we will call you to review the results.   Testing/Procedures: None ordered today.  Loel Dubonnet, NP will reach out to Dr. Radford Pax to see if we can utilize auto-CPAP instead of in lab sleep study given recent admissions.    Follow-Up: At [facility], you and your health needs are our priority.  As part of our continuing mission to provide you with exceptional heart care, we have created designated Provider Care Teams.  These Care Teams include your primary Cardiologist (physician) and Advanced Practice Providers (APPs -  Physician Assistants and Nurse Practitioners) who all work together to provide you with the care you need, when you need it.  We recommend signing up for the patient portal called "MyChart".  Sign up information is provided on this After Visit Summary.  MyChart is used to connect with patients for Virtual Visits (Telemedicine).  Patients are able to view lab/test results, encounter notes, upcoming appointments, etc.  Non-urgent messages can be sent to your provider as well.   To learn more about what you can do with MyChart, go to [email or web address].    Your next appointment:   6-8  week(s)  The format for your next appointment:   In Person  Provider:   K. Mali Hilty, MD or Laurann Montana, NP    Other Instructions   To prevent or reduce lower extremity swelling: Eat a low salt diet. Salt makes the body hold onto extra fluid which causes swelling. Sit with legs elevated. For example, in the recliner or on an [place].  Wear knee-high compression stockings during the daytime. Ones labeled 15-20 mmHg provide good compression.   Recommend weighing daily and keeping a log. Please call our office if you have weight gain of 2 pounds overnight or 5 pounds in 1 week.   Date  Time Weight

## 2022-04-06 NOTE — Progress Notes (Signed)
Office Visit    Patient Name: [name] Date of Encounter: 04/06/2022  PCP:  [name], [facility]  Cardiologist:  Pixie Casino, MD  Advanced Practice Provider:  No care team member to display Electrophysiologist:  None    Chief Complaint    [name] is a 71 y.o. male with a hx of hypertension, DM2, autoimmune myositis, PTSD, PE, DVT, HFrEF, CKD 3  presents today for follow-up after hospitalization Past Medical History    Past Medical History:  Diagnosis Date   Autoimmune disease ([place])    Bronchitis    Chronic hiccups    Diabetes mellitus without complication ([place])    DM2 (diabetes mellitus, type 2) ([place]) 09/28/2014   GERD (gastroesophageal reflux disease)    Hypogonadism in male 04/01/2018   Hypothyroidism    Joint pain    Necrotizing myopathy    Posttraumatic stress disorder 01/30/2022   PTSD (post-traumatic stress disorder)    Rheumatoid arthritis ([place]) 08/09/2021   RSV (respiratory syncytial virus pneumonia) 03/11/2022   Past Surgical History:  Procedure Laterality Date   BIOPSY SHOULDER     Left   IR ANGIOGRAM PULMONARY BILATERAL SELECTIVE  07/22/2021   IR THROMBECT VENO MECH MOD SED  07/22/2021   IR US GUIDE VASC ACCESS LEFT  07/22/2021   RADIOLOGY WITH ANESTHESIA Right 07/22/2021   Procedure: IR WITH ANESTHESIA;  Surgeon: Radiologist, Medication, MD;  Location: [place];  Service: Radiology;  Laterality: Right;   UPPER LEG SOFT TISSUE BIOPSY Right     Allergies  Allergies  Allergen Reactions   Benazepril Hives and Cough   Gabapentin Swelling   Hydrocodone Hives and Itching   Shellfish Allergy Diarrhea and Nausea And Vomiting    History of Present Illness    [name] is a 71 y.o. male with a hx of hypertension, DM2, nonobstructive CAD, autoimmune myositis, PTSD, PE, DVT, HFrEF, CKD 3 last seen 10/27/21  He was admitted 07/21/2021 and acute PE as well as acute RLE DVT with acute hypoxic respiratory failure in the  setting of testosterone injections were discontinued.  Started on heparin and transitioned to Eliquis.  Echo  07/23/2021 with LVEF 35-40%, wall motion abnormalities noted, grade 1 diastolic dysfunction, trivial MR, mild AI, mild dilation ascending aorta 41 mm.  Due to AKI he was recommended for outpatient ischemic evaluation.  Seen 08/14/2021 Imdur discontinued due to patient report of headache.  Cardiac CTA 08/28/2021 coronary calcium score of 129 placing him in the 64th percentile for age, gender. LM 1-24% stenosis and ostial/prox LAD mild 25-49% stenosis (FFR 0.88 prox and 0.82 mid - no significant stenosis).   Seen in follow-up 10/09/2021.  Lasix was increased to 40 mg daily for 3 days then return to 20 mg daily .  Spironolactone 12.5 mg daily added. Seen 10/27/21 and transitioned from Hydralazine to [facility]. Repeat echo 12/20/21 LVEF 55-60%, stable wall motion abnormalities, gr1DD.  Last seen in 01/09/2022 and Lasix reduced to only as needed due to recovered LVEF and Entresto reduced to half tablet twice daily due to hypotension.  Underwent sleep study 02/20/2022 with moderate OSA recommended for CPAP titration.  Admitted 11/12 - 03/15/2022 with AMS found to have pneumonia as well as failure exacerbation requiring diuresis.  He was discharged on torsemide.  Entresto discontinued due to hypotension.  Carvedilol dose reduced.  Echo during that admission with LVEF 55 to 92%, grade 1 diastolic dysfunction, aortic valve sclerosis without stenosis, mild dilation aortic root  41 mm.  He had repeat ED visit after fall 03/16/2022.  Readmitted 11/20 - 03/24/2022 with acute metabolic encephalopathy with hypoglycemia, dehydration.  No evidence of infection.  Spironolactone, torsemide were discontinued.  He saw PCP 03/29/2022 with blood pressure of 143/80.  He noted increased lower extremity edema.  His tramadol was adjusted and reduced as he felt it was too strong for him.  Torsemide was resumed.  He presents today for  follow-up with his wife. His 70 year old son is a Paramedic at [employer] with goal of being an MD. Had his first day of physical therapy at home yesterday. Notes he feels a bit better since starting the fluid pill back. Edema has been improving on Torsemide '10mg'$  daily though not resolved.  Feels his increased edema is impeding his mobility.  BP at home in the 110s-120s/70-80s. Weight today in our clinic is similar compared to weight in October.  Notes labile weight at home though they are comparing home scale versus PT scale versus PCP scale.  EKGs/Labs/Other Studies Reviewed:   The following studies were reviewed today: Echo 03/14/22  1. Left ventricular ejection fraction, by estimation, is 55 to 60%. The  left ventricle has normal function. The left ventricle demonstrates  regional wall motion abnormalities (see scoring diagram/findings for  description). Left ventricular diastolic  parameters are consistent with Grade I diastolic dysfunction (impaired  relaxation).   2. Right ventricular systolic function is normal. The right ventricular  size is normal.   3. The mitral valve is normal in structure. No evidence of mitral valve  regurgitation. No evidence of mitral stenosis.   4. The aortic valve is tricuspid. There is mild calcification of the  aortic valve. There is mild thickening of the aortic valve. Aortic valve  regurgitation is not visualized. Aortic valve sclerosis is present, with  no evidence of aortic valve stenosis.   5. Aortic dilatation noted. There is mild dilatation of the aortic root,  measuring 41 mm.   6. The inferior vena cava is normal in size with greater than 50%  respiratory variability, suggesting right atrial pressure of 3 mmHg.   Comparison(s): Prior images unable to be directly viewed, comparison made  by report only. The left ventricular function has improved.   Echo 11/2021   1. Left ventricular ejection fraction, by estimation, is 55 to 60%. The  left ventricle  has normal function. The left ventricle demonstrates  regional wall motion abnormalities (see scoring diagram/findings for  description). Left ventricular diastolic  parameters are consistent with Grade I diastolic dysfunction (impaired  relaxation). There is mild hypokinesis of the left ventricular, apical  septal wall.   2. Right ventricular systolic function is normal. The right ventricular  size is normal.   3. The mitral valve is normal in structure. No evidence of mitral valve  regurgitation. No evidence of mitral stenosis.   4. The aortic valve is tricuspid. There is mild calcification of the  aortic valve. Aortic valve regurgitation is not visualized. Aortic valve  sclerosis/calcification is present, without any evidence of aortic  stenosis.   5. There is mild dilatation of the ascending aorta, measuring 42 mm.   6. The inferior vena cava is normal in size with greater than 50%  respiratory variability, suggesting right atrial pressure of 3 mmHg.   Comparison(s): The left ventricular function has improved. The left  ventricular wall motion improved.   Cardiac CTA 08/28/21 FINDINGS: Non-cardiac: See separate report from [facility].   Pulmonary veins drain normally to  the left atrium. No LA appendage thrombus noted.   Calcium Score: 129 Agatston units.   Coronary Arteries: Right dominant with no anomalies   LM: Mixed plaque distal left main, mild (1-24%) stenosis.   LAD system: Mixed plaque ostial/proximal LAD, mild (25-49%) stenosis.   Circumflex system: No plaque or stenosis.   RCA system: No plaque or stenosis.   IMPRESSION: 1. Coronary artery calcium score 129 Agatston units. This places the patient in the 64th percentile for age and gender, suggesting intermediate risk for future cardiac events.   2. Suspect mild (25-49%) stenosis in the ostial/proximal LAD. Will send for FFR to confirm.  FFR 1. Left Main: No significant stenosis.   2. LAD: 0.88  proximal LAD, 0.82 mid LAD. 3. LCX: No significant stenosis. 4. RCA: No significant stenosis.   IMPRESSION: 1. CT FFR analysis didn't show any significant stenosis in the ostial/proximal LAD.  Echo 07/23/21:  1. Left ventricular ejection fraction, by estimation, is 35 to 40%. Left  ventricular ejection fraction by 2D MOD biplane is 35.4 %. The left  ventricle has moderately decreased function. The left ventricle  demonstrates regional wall motion abnormalities  (see scoring diagram/findings for description). Left ventricular diastolic  parameters are consistent with Grade I diastolic dysfunction (impaired  relaxation). There is severe hypokinesis of the left ventricular, entire  inferoseptal wall, inferior wall,  apical segment and inferolateral wall.   2. Poorly visualized RV, however, based on TAPSE and RsVel, the right  ventricular systolic function is normal.   3. The mitral valve is abnormal. Trivial mitral valve regurgitation.   4. The aortic valve is tricuspid. Aortic valve regurgitation is mild.  Aortic valve sclerosis is present, with no evidence of aortic valve  stenosis.   5. Aortic dilatation noted. There is mild dilatation of the ascending  aorta, measuring 41 mm.   6. The inferior vena cava is normal in size with greater than 50%  respiratory variability, suggesting right atrial pressure of 3 mmHg.   EKG: No EKG today  Recent Labs: 03/19/2022: B Natriuretic Peptide 54.3 03/21/2022: TSH 0.721 03/29/2022: ALT 37; BUN 22; Creat 1.34; Hemoglobin 9.8; Magnesium 2.2; Platelets 309; Potassium 5.4; Sodium 138  Recent Lipid Panel    Component Value Date/Time   CHOL 182 07/24/2021 0022   TRIG 132 07/24/2021 0022   HDL 41 07/24/2021 0022   CHOLHDL 4.4 07/24/2021 0022   VLDL 26 07/24/2021 0022   LDLCALC 115 (H) 07/24/2021 0022   LDLCALC 131 (H) 09/22/2019 0858   LDLDIRECT 72.0 04/01/2018 1035    Home Medications   Current Meds  Medication Sig   acarbose (PRECOSE)  100 MG tablet Take 100 mg by mouth 3 (three) times daily with meals.   acetaminophen (TYLENOL) 500 MG tablet Take 500-1,000 mg by mouth See admin instructions. Take 1000 mg by mouth in the morning and 500 mg at bedtime   ALPRAZolam (XANAX) 0.25 MG tablet Take 0.5-1 tablets (0.125-0.25 mg total) by mouth 2 (two) times daily as needed for anxiety.   apixaban (ELIQUIS) 5 MG TABS tablet Take 1 tablet (5 mg total) by mouth 2 (two) times daily.   baclofen (LIORESAL) 20 MG tablet TAKE 1 TABLET BY MOUTH TWICE A DAY   calcium carbonate (OS-CAL) 600 MG TABS tablet Take 600 mg by mouth daily.   Calcium Carbonate Antacid (TUMS PO) Take 3 tablets by mouth 2 (two) times daily as needed (heartburn).   carvedilol (COREG) 6.25 MG tablet Take 1 tablet (6.25 mg total) by  mouth 2 (two) times daily.   folic acid (FOLVITE) 1 MG tablet Take 2 mg by mouth daily.   insulin glargine (LANTUS) 100 UNIT/ML injection Inject 0.24 mLs (24 Units total) into the skin daily.   ipratropium-albuterol (DUONEB) 0.5-2.5 (3) MG/3ML SOLN Use 1 vial (76m) by nebulization 4 (four) times daily. (Patient taking differently: Take 3 mLs by nebulization as needed (wheezing/SOB).)   Levothyroxine Sodium 75 MCG CAPS Take 75 mcg by mouth daily.   loratadine (CLARITIN) 10 MG tablet Take 10 mg by mouth daily.   methotrexate (RHEUMATREX) 5 MG tablet Take 6 tablets by mouth once a week. Caution: Chemotherapy. Protect from light.   pantoprazole (PROTONIX) 40 MG tablet Take 1 tablet (40 mg total) by mouth as needed. (Patient taking differently: Take 40 mg by mouth daily.)   polyvinyl alcohol (LIQUIFILM TEARS) 1.4 % ophthalmic solution Place 1 drop into both eyes in the morning and at bedtime.   predniSONE (DELTASONE) 10 MG tablet Take 10 mg by mouth daily with breakfast. Take with 5 mg tablet to equal 15 mg   predniSONE (DELTASONE) 5 MG tablet Take 5 mg by mouth daily with breakfast. Take with 10 mg to make a total of 15 mg   PRESCRIPTION MEDICATION Place  1 drop into both eyes in the morning and at bedtime. Th[facility] Eye Medicine -- Unknown Name -- Unknown Dosing   QUEtiapine (SEROQUEL) 25 MG tablet Take 1 tablet (25 mg total) by mouth at bedtime.   torsemide (DEMADEX) 20 MG tablet Take 1 tablet (20 mg total) by mouth daily.   traMADol-acetaminophen (ULTRACET) 37.5-325 MG tablet Take 1 tablet by mouth every 8 (eight) hours as needed.   vitamin B-12 (CYANOCOBALAMIN) 100 MCG tablet Take 100 mcg by mouth daily.   [DISCONTINUED] sertraline (ZOLOFT) 25 MG tablet Take 25 mg by mouth daily.     Review of Systems     All other systems reviewed and are otherwise negative except as noted above.  Physical Exam    VS:  BP 136/79 (BP Location: Left Arm, Patient Position: Sitting, Cuff Size: Normal)   Pulse 93   Ht '5\' 8"'$  (1.727 m)   Wt 210 lb 3.2 oz (95.3 kg)   SpO2 99%   BMI 31.96 kg/m  , BMI Body mass index is 31.96 kg/m.  Wt Readings from Last 3 Encounters:  04/06/22 210 lb 3.2 oz (95.3 kg)  03/29/22 206 lb (93.4 kg)  03/22/22 191 lb 5.8 oz (86.8 kg)    GEN: Well nourished, overweight, well developed, in no acute distress. HEENT: normal. Neck: Supple, no JVD, carotid bruits, or masses. Cardiac: RRR, no murmurs, rubs, or gallops. No clubbing, cyanosis.  Bilateral lower extremity 2+ pitting edema. Radials/PT 2+ and equal bilaterally.  Respiratory:  Respirations regular and unlabored, clear to auscultation bilaterally. GI: Soft, nontender, nondistended. MS: No deformity or atrophy. Skin: Warm and dry, no rash. Neuro:  Strength and sensation are intact. Psych: Normal affect.  Assessment & Plan     PE / Hx of DVT - In setting of testosterone injections, admission 06/2021 with acute saddle PE with right heart strain and right popliteal s/p thrombectomy 07/22/21.  Continue Eliquis 5 mg twice daily.  Will require lifelong anticoagulation.  Does not meet dose reduction criteria.  Denies bleeding complications.  Refill of Eliquis provided.  Future  refills may come from our office or primary care.  HTN - Goal BP <130/80.  Previous hypotension is resolved since reduced dose of carvedilol and discontinuation of  Entresto.  Continue present antihypertensive regimen.    Nonobstructive CAD - Cardiac CTA 08/28/21 moderate nonobstructive disease. No anginal symptoms. Continue Coreg, Atorvastatin. No ASA due to [facility]. Heart healthy diet and regular cardiovascular exercise encouraged.    UUE2C - Careful titration of diuretic and antihypertensive.  BMP today.  Hyperkalemia -03/29/2022 potassium 5.4.  Update BMP today.  OSA - Pending CPAP titration. However, given recent admissions prefers to proceed with autoPAP. Will route to Dr. Radford Pax for her review.   Combined heart failure with recovered LVEF - LVEF 35-40% in setting of PE. 11/2021 and 02/2022 repeat echo with LVEF returned to normal 55-60%. GDMT includes Coreg, Torsemide.  Bilateral lower extremity with 2+ pitting edema-increase torsemide to 20 mg daily.  Careful monitoring of BP to ensure no recurrent hypotension.  BMP, BNP today Low sodium diet, fluid restriction <2L, and daily weights encouraged. Educated to contact our office for weight gain of 2 lbs overnight or 5 lbs in one week.   GERD - Continue to follow with PCP.  Continue Protonix.    Hypothyroidism - Continue to follow with PCP.   DM2 -  Continue to follow with PCP.   Disposition: Follow up in 6 to 8 weeks with Pixie Casino, MD or APP.  Signed, Loel Dubonnet, NP 04/06/2022, 4:54 PM [facility]

## 2022-04-07 LAB — BASIC METABOLIC PANEL
BUN/Creatinine Ratio: 26 — ABNORMAL HIGH (ref 10–24)
BUN: 32 mg/dL — ABNORMAL HIGH (ref 8–27)
CO2: 18 mmol/L — ABNORMAL LOW (ref 20–29)
Calcium: 10.5 mg/dL — ABNORMAL HIGH (ref 8.6–10.2)
Chloride: 101 mmol/L (ref 96–106)
Creatinine, Ser: 1.25 mg/dL (ref 0.76–1.27)
Glucose: 214 mg/dL — ABNORMAL HIGH (ref 70–99)
Potassium: 4.4 mmol/L (ref 3.5–5.2)
Sodium: 142 mmol/L (ref 134–144)
eGFR: 61 mL/min/{1.73_m2} (ref >59)

## 2022-04-07 LAB — BRAIN NATRIURETIC PEPTIDE: BNP: 60.2 pg/mL (ref 0.0–100.0)

## 2022-04-10 ENCOUNTER — Ambulatory Visit (HOSPITAL_BASED_OUTPATIENT_CLINIC_OR_DEPARTMENT_OTHER): Payer: Medicare Other | Admitting: Family

## 2022-04-11 ENCOUNTER — Telehealth: Payer: Self-pay | Admitting: *Deleted

## 2022-04-11 DIAGNOSIS — I25118 Atherosclerotic heart disease of native coronary artery with other forms of angina pectoris: Secondary | ICD-10-CM

## 2022-04-11 DIAGNOSIS — I10 Essential (primary) hypertension: Secondary | ICD-10-CM

## 2022-04-11 DIAGNOSIS — G4733 Obstructive sleep apnea (adult) (pediatric): Secondary | ICD-10-CM

## 2022-04-11 DIAGNOSIS — R0683 Snoring: Secondary | ICD-10-CM

## 2022-04-11 NOTE — Telephone Encounter (Signed)
DME selection is [facility]. Patient understands he will be contacted by [facility] to set up his cpap. Patient understands to call if [facility] does not contact him with new setup in a timely manner. Patient understands they will be called once confirmation has been received from [facility]/ that they have received their new machine to schedule 10 week follow up appointment.   [facility] notified of new cpap order  Please add to airview Patient was grateful for the call and thanked me. 

## 2022-04-11 NOTE — Telephone Encounter (Signed)
-----   Message from Sueanne Margarita, MD sent at 04/07/2022 12:41 PM EST ----- Start ResMEd auto CPAP from 4 to 20cm H2O with heated humidity and mask of choice with followup with me in 6 weeks ----- Message ----- From: Loel Dubonnet, NP Sent: 04/06/2022   4:53 PM EST To: Sueanne Margarita, MD  2 hospitalizations since recommended for CPAP titration and he is requesting autoPAP instead if appropriate. Thanks!

## 2022-04-11 NOTE — Telephone Encounter (Signed)
Order placed to [facility] via community message. Left detailed message on voicemail and informed patient to call back.Marolyn Hammock, CMA .

## 2022-04-12 ENCOUNTER — Ambulatory Visit: Payer: No Typology Code available for payment source | Admitting: Family Medicine

## 2022-04-13 ENCOUNTER — Encounter (HOSPITAL_BASED_OUTPATIENT_CLINIC_OR_DEPARTMENT_OTHER): Payer: Self-pay

## 2022-04-16 ENCOUNTER — Encounter: Payer: Self-pay | Admitting: Family Medicine

## 2022-04-16 ENCOUNTER — Ambulatory Visit (INDEPENDENT_AMBULATORY_CARE_PROVIDER_SITE_OTHER): Payer: Medicare Other | Admitting: Family Medicine

## 2022-04-16 ENCOUNTER — Ambulatory Visit (INDEPENDENT_AMBULATORY_CARE_PROVIDER_SITE_OTHER): Payer: Medicare Other

## 2022-04-16 VITALS — BP 117/63 | HR 95 | Ht 68.0 in | Wt 213.0 lb

## 2022-04-16 DIAGNOSIS — F4310 Post-traumatic stress disorder, unspecified: Secondary | ICD-10-CM

## 2022-04-16 DIAGNOSIS — R052 Subacute cough: Secondary | ICD-10-CM | POA: Diagnosis not present

## 2022-04-16 DIAGNOSIS — R499 Unspecified voice and resonance disorder: Secondary | ICD-10-CM | POA: Diagnosis not present

## 2022-04-16 DIAGNOSIS — E1139 Type 2 diabetes mellitus with other diabetic ophthalmic complication: Secondary | ICD-10-CM | POA: Insufficient documentation

## 2022-04-16 DIAGNOSIS — R1312 Dysphagia, oropharyngeal phase: Secondary | ICD-10-CM

## 2022-04-16 DIAGNOSIS — W19XXXA Unspecified fall, initial encounter: Secondary | ICD-10-CM

## 2022-04-16 DIAGNOSIS — R0600 Dyspnea, unspecified: Secondary | ICD-10-CM

## 2022-04-16 DIAGNOSIS — R296 Repeated falls: Secondary | ICD-10-CM | POA: Diagnosis not present

## 2022-04-16 DIAGNOSIS — I5042 Chronic combined systolic (congestive) and diastolic (congestive) heart failure: Secondary | ICD-10-CM | POA: Diagnosis not present

## 2022-04-16 DIAGNOSIS — M7989 Other specified soft tissue disorders: Secondary | ICD-10-CM | POA: Diagnosis not present

## 2022-04-16 DIAGNOSIS — R410 Disorientation, unspecified: Secondary | ICD-10-CM

## 2022-04-16 DIAGNOSIS — R519 Headache, unspecified: Secondary | ICD-10-CM | POA: Diagnosis not present

## 2022-04-16 MED ORDER — AMBULATORY NON FORMULARY MEDICATION: 0 refills | Status: AC

## 2022-04-16 NOTE — Patient Instructions (Addendum)
Stop downstairs and have ultrasound and chest xray.   MRI ordered.  Will be scheduled once approved by insurance.  If having worsening symptoms please go to the hospital.   We'll be in touch with lab results.   We'll plan follow up based on lab/imaging results.    You should be contacted with new home health group soon.

## 2022-04-18 LAB — URINE CULTURE
MICRO NUMBER:: 14331517
Result:: NO GROWTH
SPECIMEN QUALITY:: ADEQUATE

## 2022-04-18 LAB — CBC WITH DIFFERENTIAL/PLATELET
Absolute Monocytes: 190 cells/uL — ABNORMAL LOW (ref 200–950)
Basophils Absolute: 8 cells/uL (ref 0–200)
Basophils Relative: 0.1 %
Eosinophils Absolute: 23 cells/uL (ref 15–500)
Eosinophils Relative: 0.3 %
HCT: 29.2 % — ABNORMAL LOW (ref 38.5–50.0)
Hemoglobin: 9.7 g/dL — ABNORMAL LOW (ref 13.2–17.1)
Lymphs Abs: 737 cells/uL — ABNORMAL LOW (ref 850–3900)
MCH: 31.7 pg (ref 27.0–33.0)
MCHC: 33.2 g/dL (ref 32.0–36.0)
MCV: 95.4 fL (ref 80.0–100.0)
MPV: 11 fL (ref 7.5–12.5)
Monocytes Relative: 2.5 %
Neutro Abs: 6642 cells/uL (ref 1500–7800)
Neutrophils Relative %: 87.4 %
Platelets: 152 10*3/uL (ref 140–400)
RBC: 3.06 10*6/uL — ABNORMAL LOW (ref 4.20–5.80)
RDW: 14.8 % (ref 11.0–15.0)
Total Lymphocyte: 9.7 %
WBC: 7.6 10*3/uL (ref 3.8–10.8)

## 2022-04-18 LAB — COMPLETE METABOLIC PANEL WITH GFR
AG Ratio: 2.3 (calc) (ref 1.0–2.5)
ALT: 19 U/L (ref 9–46)
AST: 15 U/L (ref 10–35)
Albumin: 4.3 g/dL (ref 3.6–5.1)
Alkaline phosphatase (APISO): 50 U/L (ref 35–144)
BUN/Creatinine Ratio: 19 (calc) (ref 6–22)
BUN: 28 mg/dL — ABNORMAL HIGH (ref 7–25)
CO2: 25 mmol/L (ref 20–32)
Calcium: 10 mg/dL (ref 8.6–10.3)
Chloride: 98 mmol/L (ref 98–110)
Creat: 1.45 mg/dL — ABNORMAL HIGH (ref 0.70–1.28)
Globulin: 1.9 g/dL (calc) (ref 1.9–3.7)
Glucose, Bld: 236 mg/dL — ABNORMAL HIGH (ref 65–99)
Potassium: 5 mmol/L (ref 3.5–5.3)
Sodium: 137 mmol/L (ref 135–146)
Total Bilirubin: 0.6 mg/dL (ref 0.2–1.2)
Total Protein: 6.2 g/dL (ref 6.1–8.1)
eGFR: 51 mL/min/{1.73_m2} — ABNORMAL LOW (ref >=60)

## 2022-04-18 LAB — URINALYSIS, ROUTINE W REFLEX MICROSCOPIC
Bilirubin Urine: NEGATIVE
Glucose, UA: NEGATIVE
Hgb urine dipstick: NEGATIVE
Ketones, ur: NEGATIVE
Leukocytes,Ua: NEGATIVE
Nitrite: NEGATIVE
Protein, ur: NEGATIVE
Specific Gravity, Urine: 1.009 (ref 1.001–1.035)
pH: <=5 (ref 5.0–8.0)

## 2022-04-19 ENCOUNTER — Telehealth (HOSPITAL_COMMUNITY): Payer: Self-pay | Admitting: *Deleted

## 2022-04-19 NOTE — Telephone Encounter (Signed)
Attempted to contact patient to schedule OP MBS. Left VM. RKEEL 

## 2022-04-20 NOTE — Progress Notes (Signed)
Increase to '40mg'$  x1 week then reduce back to '20mg'$ .    CM

## 2022-04-23 DIAGNOSIS — R410 Disorientation, unspecified: Secondary | ICD-10-CM | POA: Insufficient documentation

## 2022-04-23 DIAGNOSIS — R1312 Dysphagia, oropharyngeal phase: Secondary | ICD-10-CM | POA: Insufficient documentation

## 2022-04-23 DIAGNOSIS — M7989 Other specified soft tissue disorders: Secondary | ICD-10-CM | POA: Insufficient documentation

## 2022-04-23 DIAGNOSIS — R499 Unspecified voice and resonance disorder: Secondary | ICD-10-CM | POA: Insufficient documentation

## 2022-04-23 NOTE — Assessment & Plan Note (Signed)
Continues to have episodes of confusion.  Checking labs, urinalysis and updated chest x-ray.  Also noted some dysphagia, stat MRI of the brain ordered.

## 2022-04-23 NOTE — Progress Notes (Signed)
[name] - 71 y.o. male MRN [id number]  Date of birth: June 03, 1950  Subjective Chief Complaint  Patient presents with   Altered Mental Status    HPI [name] is a 71 y.o. male here today for follow up visit.   Since his last visit with me he did have follow-up with cardiology.  Noted to Continue swelling.  Torsemide increased to 20 mg daily.  Encouraged low-sodium diet.  Unable to add Entresto back on due to continued hypotension.  OSA pending CPAP titration however he prefers AutoPap.  He continues to have bilateral lower extremity swelling.  Does have some pain associated with this.  Prior history of DVT.  He does report that he is taking Eliquis regularly.  He denies any chest pain or shortness of breath.  He does continue to have intermittent episodes of confusion.  His wife and son noticed that his voice is a little more raspy with wet quality to it.  He has not noticed any difficulty swallowing but he does cough after trying to swallow.  He is having some increased falls as well related to gait unsteadiness.  Previous treatment for pneumonia.  They do need updated order for 3 and 1 bedside commode.  He does have home health coming out for physical therapy.  ROS:  A comprehensive ROS was completed and negative except as noted per HPI  Allergies  Allergen Reactions   Benazepril Hives and Cough   Gabapentin Swelling   Hydrocodone Hives and Itching   Shellfish Allergy Diarrhea and Nausea And Vomiting    Past Medical History:  Diagnosis Date   Autoimmune disease ([place])    Bronchitis    Chronic hiccups    Diabetes mellitus without complication (HCC)    DM2 (diabetes mellitus, type 2) ([place]) 09/28/2014   GERD (gastroesophageal reflux disease)    Hypogonadism in male 04/01/2018   Hypothyroidism    Joint pain    Necrotizing myopathy    Posttraumatic stress disorder 01/30/2022   PTSD (post-traumatic stress disorder)    Rheumatoid arthritis ([place]) 08/09/2021   RSV (respiratory  syncytial virus pneumonia) 03/11/2022    Past Surgical History:  Procedure Laterality Date   BIOPSY SHOULDER     Left   IR ANGIOGRAM PULMONARY BILATERAL SELECTIVE  07/22/2021   IR THROMBECT VENO MECH MOD SED  07/22/2021   IR US GUIDE VASC ACCESS LEFT  07/22/2021   RADIOLOGY WITH ANESTHESIA Right 07/22/2021   Procedure: IR WITH ANESTHESIA;  Surgeon: Radiologist, Medication, MD;  Location: [facility];  Service: Radiology;  Laterality: Right;   UPPER LEG SOFT TISSUE BIOPSY Right     Social History   Socioeconomic History   Marital status: Married    Spouse name: Not on file   Number of children: 4   Years of education: 16   Highest education level: Not on file  Occupational History   Occupation: Retired  Tobacco Use   Smoking status: Never   Smokeless tobacco: Never  Vaping Use   Vaping Use: Never used  Substance and Sexual Activity   Alcohol use: No   Drug use: No   Sexual activity: Not on file  Other Topics Concern   Not on file  Social History Narrative   ** Merged History Encounter **       Lives at home with his wife and son. Right-handed. 2 cups caffeine per day.   Social Determinants of Health   Financial Resource Strain: Not on file  Food Insecurity: Not on file  Transportation Needs: Not on file  Physical Activity: Not on file  Stress: Not on file  Social Connections: Not on file    Family History  Problem Relation Age of Onset   Hypertension Mother    Diabetes Mother    Arthritis Mother    Depression Mother    Heart attack Father    Hypertension Father     Health Maintenance  Topic Date Due   DTaP/Tdap/Td (2 - Td or Tdap) 08/19/2021   OPHTHALMOLOGY EXAM  03/09/2022   Diabetic kidney evaluation - Urine ACR  05/31/2022 (Originally 10/29/1968)   COVID-19 Vaccine (6 - 2023-24 season) 05/31/2022 (Originally 12/29/2021)   Medicare Annual Wellness (AWV)  05/31/2022 (Originally [birth year])   Zoster Vaccines- Shingrix (1 of 2) 06/28/2022 (Originally 10/29/1967)    COLONOSCOPY (Pts 45-61yr Insurance coverage will need to be confirmed)  08/10/2022 (Originally 10/28/1993)   HEMOGLOBIN A1C  09/09/2022   FOOT EXAM  11/15/2022   Diabetic kidney evaluation - eGFR measurement  04/17/2023   Pneumonia Vaccine 71 Years old  Completed   INFLUENZA VACCINE  Completed   Hepatitis C Screening  Completed   HPV VACCINES  Aged Out     ----------------------------------------------------------------------------------------------------------------------------------------------------------------------------------------------------------------- Physical Exam BP 117/63 (BP Location: Left Arm, Patient Position: Sitting, Cuff Size: Normal)   Pulse 95   Ht _0  (1.727 m)   Wt 213 lb (96.6 kg)   SpO2 98%   BMI 32.39 kg/m   Physical Exam Constitutional:      Appearance: Normal appearance.  HENT:     Head: Normocephalic and atraumatic.  Eyes:     General: No scleral icterus. Cardiovascular:     Rate and Rhythm: Normal rate and regular rhythm.  Pulmonary:     Effort: Pulmonary effort is normal.     Breath sounds: Normal breath sounds.  Musculoskeletal:     Cervical back: Neck supple.  Neurological:     General: No focal deficit present.     Mental Status: He is alert and oriented to person, place, and time.     Comments: Raspy/wet quality to voice when speaking.  Psychiatric:        Mood and Affect: Mood normal.        Behavior: Behavior normal.     ------------------------------------------------------------------------------------------------------------------------------------------------------------------------------------------------------------------- Assessment and Plan  Chronic combined systolic and diastolic CHF (congestive heart failure) (HCC) Continues to have increased leg swelling.  Discussed strategies to help with this including elevation and/or compression stockings.  Checking labs today.  Maybe would increase his torsemide  some.  Confusion Continues to have episodes of confusion.  Checking labs, urinalysis and updated chest x-ray.  Also noted some dysphagia, stat MRI of the brain ordered.  Frequent falls Home PT has been ordered.  Encouraged him to participate with this.  Leg swelling  Likely related to excess fluid however given his history of DVT and increased pain ultrasound of lower extremities was ordered today.  He does endorse taking Eliquis regularly.  Encouraged compression stockings as well as elevation when sitting.  Posttraumatic stress disorder The lability has improved.  He is taking sertraline regularly.   Meds ordered this encounter  Medications   AMBULATORY NON FORMULARY MEDICATION    Sig: Please provide 3 in 1 bedside commode.  Dx:R29.6, M62.81    Dispense:  1 Device    Refill:  0    No follow-ups on file.    This visit occurred during the SARS-CoV-2 public health emergency.  Safety protocols were in place, including screening questions  prior to the visit, additional usage of staff PPE, and extensive cleaning of exam room while observing appropriate contact time as indicated for disinfecting solutions.

## 2022-04-23 NOTE — Assessment & Plan Note (Signed)
Continues to have increased leg swelling.  Discussed strategies to help with this including elevation and/or compression stockings.  Checking labs today.  Maybe would increase his torsemide some.

## 2022-04-23 NOTE — Assessment & Plan Note (Addendum)
  Likely related to excess fluid however given his history of DVT and increased pain ultrasound of lower extremities was ordered today.  He does endorse taking Eliquis regularly.  Encouraged compression stockings as well as elevation when sitting.

## 2022-04-23 NOTE — Assessment & Plan Note (Signed)
Home PT has been ordered.  Encouraged him to participate with this.

## 2022-04-23 NOTE — Assessment & Plan Note (Signed)
The lability has improved.  He is taking sertraline regularly.

## 2022-04-26 ENCOUNTER — Telehealth (HOSPITAL_COMMUNITY): Payer: Self-pay

## 2022-04-26 NOTE — Telephone Encounter (Signed)
2nd attempt to contact patient to schedule Modified Barium Swallow - left voicemail. 

## 2022-05-03 ENCOUNTER — Telehealth (HOSPITAL_COMMUNITY): Payer: Self-pay

## 2022-05-03 NOTE — Telephone Encounter (Signed)
3rd attempt to contact patient to schedule Modified Barium Swallow - left voicemail. If no return call by 1/11 - order will be cancelled.

## 2022-05-17 ENCOUNTER — Ambulatory Visit: Payer: Medicare Other | Admitting: Family Medicine

## 2022-05-18 ENCOUNTER — Ambulatory Visit (HOSPITAL_BASED_OUTPATIENT_CLINIC_OR_DEPARTMENT_OTHER): Payer: No Typology Code available for payment source | Admitting: Family

## 2022-06-04 NOTE — Progress Notes (Unsigned)
  Labs from [place]: 05/25/22 Hb 10.1, creatinine 1.77, ALT 20, AST 11,

## 2022-06-05 ENCOUNTER — Encounter (HOSPITAL_BASED_OUTPATIENT_CLINIC_OR_DEPARTMENT_OTHER): Payer: Self-pay | Admitting: Family

## 2022-06-05 ENCOUNTER — Encounter (HOSPITAL_BASED_OUTPATIENT_CLINIC_OR_DEPARTMENT_OTHER): Payer: Self-pay

## 2022-06-05 ENCOUNTER — Ambulatory Visit (INDEPENDENT_AMBULATORY_CARE_PROVIDER_SITE_OTHER): Payer: Medicare Other | Admitting: Family

## 2022-06-05 VITALS — BP 114/60 | HR 82 | Ht 68.0 in | Wt 218.0 lb

## 2022-06-05 DIAGNOSIS — I5032 Chronic diastolic (congestive) heart failure: Secondary | ICD-10-CM

## 2022-06-05 DIAGNOSIS — R600 Localized edema: Secondary | ICD-10-CM | POA: Diagnosis not present

## 2022-06-05 DIAGNOSIS — I10 Essential (primary) hypertension: Secondary | ICD-10-CM

## 2022-06-05 DIAGNOSIS — G4733 Obstructive sleep apnea (adult) (pediatric): Secondary | ICD-10-CM

## 2022-06-05 MED ORDER — SPIRONOLACTONE 25 MG PO TABS: 12.5000 mg | ORAL_TABLET | Freq: Every day | ORAL | 3 refills | Status: DC

## 2022-06-05 NOTE — Patient Instructions (Addendum)
Medication Instructions:  Your physician has recommended you make the following change in your medication:   CHANGE Torsemide to '40mg'$  (2 tabs) daily for 3 days then return to '20mg'$  (1 tab) daily  START Spironolactone half tablet (12.'5mg'$  ) daily  *If you need a refill on your cardiac medications before your next appointment, please call your pharmacy*   Lab Work: Your physician recommends that you return for lab work in 1 week: BMP, BNP   Please return for Lab work. You may come to the...   [facility] (3rd floor) [address] [place], [place] [address]  Open: 8am-Noon and 1pm-4:30pm  Please ring the doorbell on the small table when you exit the elevator and the Lab Tech will come get you  [place] [facility] [address] [place], [place], [place] [address] Open: 8am-1pm, then 2pm-4:30pm   [place]- Please see attached locations sheet stapled to your lab work with address and hours.   If you have labs (blood work) drawn today and your tests are completely normal, you will receive your results only by: [place] (if you have MyChart) OR A paper copy in the mail If you have any lab test that is abnormal or we need to change your treatment, we will call you to review the results.   Follow-Up: At [facility], you and your health needs are our priority.  As part of our continuing mission to provide you with exceptional heart care, we have created designated Provider Care Teams.  These Care Teams include your primary Cardiologist (physician) and Advanced Practice Providers (APPs -  Physician Assistants and Nurse Practitioners) who all work together to provide you with the care you need, when you need it.  We recommend signing up for the patient portal called "MyChart".  Sign up information is provided on this After Visit Summary.  MyChart is used to connect with patients for Virtual Visits (Telemedicine).  Patients are able  to view lab/test results, encounter notes, upcoming appointments, etc.  Non-urgent messages can be sent to your provider as well.   To learn more about what you can do with MyChart, go to [email or web address].    Your next appointment:   4-6 week(s)  Provider:   K. Mali Hilty, MD or Laurann Montana, NP    Other Instructions  Recommend weighing daily and keeping a log. Please call our office if you have weight gain of 2 pounds overnight or 5 pounds in 1 week.   Date  Time Weight

## 2022-06-08 ENCOUNTER — Ambulatory Visit: Payer: Medicare Other | Admitting: Family Medicine

## 2022-06-19 ENCOUNTER — Ambulatory Visit (INDEPENDENT_AMBULATORY_CARE_PROVIDER_SITE_OTHER): Payer: Medicare Other | Admitting: Family Medicine

## 2022-06-19 ENCOUNTER — Encounter: Payer: Self-pay | Admitting: Family Medicine

## 2022-06-19 VITALS — BP 106/64 | HR 82 | Ht 68.0 in | Wt 212.0 lb

## 2022-06-19 DIAGNOSIS — I5042 Chronic combined systolic (congestive) and diastolic (congestive) heart failure: Secondary | ICD-10-CM

## 2022-06-19 DIAGNOSIS — E039 Hypothyroidism, unspecified: Secondary | ICD-10-CM | POA: Diagnosis not present

## 2022-06-19 DIAGNOSIS — F419 Anxiety disorder, unspecified: Secondary | ICD-10-CM | POA: Diagnosis not present

## 2022-06-19 DIAGNOSIS — I2602 Saddle embolus of pulmonary artery with acute cor pulmonale: Secondary | ICD-10-CM

## 2022-06-19 DIAGNOSIS — I890 Lymphedema, not elsewhere classified: Secondary | ICD-10-CM

## 2022-06-19 DIAGNOSIS — N1831 Chronic kidney disease, stage 3a: Secondary | ICD-10-CM

## 2022-06-19 DIAGNOSIS — E1165 Type 2 diabetes mellitus with hyperglycemia: Secondary | ICD-10-CM

## 2022-06-19 DIAGNOSIS — I10 Essential (primary) hypertension: Secondary | ICD-10-CM | POA: Diagnosis not present

## 2022-06-19 DIAGNOSIS — Z794 Long term (current) use of insulin: Secondary | ICD-10-CM | POA: Diagnosis not present

## 2022-06-19 DIAGNOSIS — M7989 Other specified soft tissue disorders: Secondary | ICD-10-CM

## 2022-06-19 DIAGNOSIS — G7249 Other inflammatory and immune myopathies, not elsewhere classified: Secondary | ICD-10-CM | POA: Diagnosis not present

## 2022-06-19 LAB — CBC WITH DIFFERENTIAL/PLATELET
Absolute Monocytes: 151 cells/uL — ABNORMAL LOW (ref 200–950)
Basophils Absolute: 11 cells/uL (ref 0–200)
Basophils Relative: 0.2 %
Eosinophils Absolute: 11 cells/uL — ABNORMAL LOW (ref 15–500)
Eosinophils Relative: 0.2 %
HCT: 34 % — ABNORMAL LOW (ref 38.5–50.0)
Hemoglobin: 11.5 g/dL — ABNORMAL LOW (ref 13.2–17.1)
Lymphs Abs: 864 cells/uL (ref 850–3900)
MCH: 32.4 pg (ref 27.0–33.0)
MCHC: 33.8 g/dL (ref 32.0–36.0)
MCV: 95.8 fL (ref 80.0–100.0)
MPV: 10.8 fL (ref 7.5–12.5)
Monocytes Relative: 2.8 %
Neutro Abs: 4363 cells/uL (ref 1500–7800)
Neutrophils Relative %: 80.8 %
Platelets: 213 10*3/uL (ref 140–400)
RBC: 3.55 10*6/uL — ABNORMAL LOW (ref 4.20–5.80)
RDW: 13.8 % (ref 11.0–15.0)
Total Lymphocyte: 16 %
WBC: 5.4 10*3/uL (ref 3.8–10.8)

## 2022-06-19 LAB — BASIC METABOLIC PANEL
BUN/Creatinine Ratio: 23 (calc) — ABNORMAL HIGH (ref 6–22)
BUN: 36 mg/dL — ABNORMAL HIGH (ref 7–25)
CO2: 28 mmol/L (ref 20–32)
Calcium: 10.4 mg/dL — ABNORMAL HIGH (ref 8.6–10.3)
Chloride: 97 mmol/L — ABNORMAL LOW (ref 98–110)
Creat: 1.55 mg/dL — ABNORMAL HIGH (ref 0.70–1.28)
Glucose, Bld: 244 mg/dL — ABNORMAL HIGH (ref 65–99)
Potassium: 4.2 mmol/L (ref 3.5–5.3)
Sodium: 139 mmol/L (ref 135–146)

## 2022-06-19 NOTE — Assessment & Plan Note (Signed)
He does have continued lower extremity swelling.  No weight gain no increased dyspnea.  Lungs are clear today.  Continue Coreg with daily torsemide.  He will continue to see cardiology as well.

## 2022-06-19 NOTE — Assessment & Plan Note (Signed)
He is doing quite well with sertraline at current strength.  Will plan to continue this.

## 2022-06-19 NOTE — Assessment & Plan Note (Signed)
Update renal function today.

## 2022-06-19 NOTE — Assessment & Plan Note (Signed)
Blood pressure is well-controlled.  Will plan to continue current medications for management of his hypertension

## 2022-06-19 NOTE — Assessment & Plan Note (Signed)
Continues to have significant leg swelling.  He will continue torsemide.  Encouraged elevation of legs.  Compression stockings recommended.  Referral placed to lymphedema clinic.

## 2022-06-19 NOTE — Assessment & Plan Note (Signed)
Management per endocrinology.  Blood sugars have been stable recently.  Will continue acarbose and Lantus.

## 2022-06-19 NOTE — Progress Notes (Signed)
[name] - 72 y.o. male MRN [id number]  Date of birth: 06-30-1950  Subjective Chief Complaint  Patient presents with   Follow-up    HPI [name] is a 72 y.o. male here today for follow-up visit.  He continues to have lower extremity edema.  Remains on torsemide 20 mg daily.  Aldactone was added by cardiology as well however he reports that his nephrologist wanted him to remain off of this.  He has not had any weight gain, increased shortness of breath, scrotal edema, chest pain or palpitations.  He is not using compression stockings.  He does try to keep his legs elevated at rest.  Continues to see endocrinology through the [facility] for management of his diabetes and hypothyroidism.  These remain controlled with current medications.  Seeing rheumatology for myositis and RA.  Remains on low-dose prednisone as well as methotrexate.  Anxiety is stable with Zoloft.  ROS:  A comprehensive ROS was completed and negative except as noted per HPI  Allergies  Allergen Reactions   Benazepril Hives and Cough   Gabapentin Swelling   Hydrocodone Hives and Itching   Shellfish Allergy Diarrhea and Nausea And Vomiting    Past Medical History:  Diagnosis Date   Autoimmune disease ([place])    Bronchitis    Chronic hiccups    Diabetes mellitus without complication (HCC)    DM2 (diabetes mellitus, type 2) ([place]) 09/28/2014   GERD (gastroesophageal reflux disease)    Hypogonadism in male 04/01/2018   Hypothyroidism    Joint pain    Necrotizing myopathy    Posttraumatic stress disorder 01/30/2022   PTSD (post-traumatic stress disorder)    Rheumatoid arthritis ([place]) 08/09/2021   RSV (respiratory syncytial virus pneumonia) 03/11/2022    Past Surgical History:  Procedure Laterality Date   BIOPSY SHOULDER     Left   IR ANGIOGRAM PULMONARY BILATERAL SELECTIVE  07/22/2021   IR THROMBECT VENO MECH MOD SED  07/22/2021   IR US GUIDE VASC ACCESS LEFT  07/22/2021   RADIOLOGY WITH ANESTHESIA Right  07/22/2021   Procedure: IR WITH ANESTHESIA;  Surgeon: Radiologist, Medication, MD;  Location: [facility];  Service: Radiology;  Laterality: Right;   UPPER LEG SOFT TISSUE BIOPSY Right     Social History   Socioeconomic History   Marital status: Married    Spouse name: Not on file   Number of children: 4   Years of education: 16   Highest education level: Not on file  Occupational History   Occupation: Retired  Tobacco Use   Smoking status: Never   Smokeless tobacco: Never  Vaping Use   Vaping Use: Never used  Substance and Sexual Activity   Alcohol use: No   Drug use: No   Sexual activity: Not on file  Other Topics Concern   Not on file  Social History Narrative   ** Merged History Encounter **       Lives at home with his wife and son. Right-handed. 2 cups caffeine per day.   Social Determinants of Health   Financial Resource Strain: Not on file  Food Insecurity: Not on file  Transportation Needs: Not on file  Physical Activity: Not on file  Stress: Not on file  Social Connections: Not on file    Family History  Problem Relation Age of Onset   Hypertension Mother    Diabetes Mother    Arthritis Mother    Depression Mother    Heart attack Father    Hypertension Father  Health Maintenance  Topic Date Due   Medicare Annual Wellness (AWV)  Never done   Diabetic kidney evaluation - Urine ACR  Never done   DTaP/Tdap/Td (2 - Td or Tdap) 08/19/2021   COVID-19 Vaccine (6 - 2023-24 season) 12/29/2021   OPHTHALMOLOGY EXAM  03/09/2022   Zoster Vaccines- Shingrix (1 of 2) 06/28/2022 (Originally 10/29/1967)   COLONOSCOPY (Pts 45-30yr Insurance coverage will need to be confirmed)  08/10/2022 (Originally 10/28/1993)   HEMOGLOBIN A1C  09/09/2022   FOOT EXAM  11/15/2022   Diabetic kidney evaluation - eGFR measurement  04/17/2023   Pneumonia Vaccine 72 Years old  Completed   INFLUENZA VACCINE  Completed   Hepatitis C Screening  Completed   HPV VACCINES  Aged Out      ----------------------------------------------------------------------------------------------------------------------------------------------------------------------------------------------------------------- Physical Exam BP 106/64 (BP Location: Left Arm, Patient Position: Sitting, Cuff Size: Normal)   Pulse 82   Ht 5' 8"$  (1.727 m)   Wt 212 lb (96.2 kg)   SpO2 100%   BMI 32.23 kg/m   Physical Exam Constitutional:      Appearance: Normal appearance.  HENT:     Head: Normocephalic and atraumatic.  Eyes:     General: No scleral icterus. Cardiovascular:     Rate and Rhythm: Normal rate and regular rhythm.     Comments: 2-3+ pitting edema bilaterally in the lower extremities Pulmonary:     Effort: Pulmonary effort is normal.     Breath sounds: Normal breath sounds.  Musculoskeletal:     Cervical back: Neck supple.  Neurological:     Mental Status: He is alert.  Psychiatric:        Mood and Affect: Mood normal.        Behavior: Behavior normal.     ------------------------------------------------------------------------------------------------------------------------------------------------------------------------------------------------------------------- Assessment and Plan  Acute pulmonary embolism ([place] He remains on Eliquis.  Will need this indefinitely.  Continue at current strength.  Benign essential HTN Blood pressure is well-controlled.  Will plan to continue current medications for management of his hypertension  Chronic combined systolic and diastolic CHF (congestive heart failure) ([place] He does have continued lower extremity swelling.  No weight gain no increased dyspnea.  Lungs are clear today.  Continue Coreg with daily torsemide.  He will continue to see cardiology as well.  DM2 (diabetes mellitus, type 2) ([place] Management per endocrinology.  Blood sugars have been stable recently.  Will continue acarbose and Lantus.  Hypothyroidism Doing well with  current strength of levothyroxine. Lab Results  Component Value Date   TSH 0.721 03/21/2022     Autoimmune myopathy Management per rheumatology.  Remains on low-dose prednisone and methotrexate.  CKD (chronic kidney disease), stage III ([place] Update renal function today.  Anxiety He is doing quite well with sertraline at current strength.  Will plan to continue this.  Leg swelling Continues to have significant leg swelling.  He will continue torsemide.  Encouraged elevation of legs.  Compression stockings recommended.  Referral placed to lymphedema clinic.   No orders of the defined types were placed in this encounter.   Return in about 4 months (around 10/18/2022) for HTN/Edema.    This visit occurred during the SARS-CoV-2 public health emergency.  Safety protocols were in place, including screening questions prior to the visit, additional usage of staff PPE, and extensive cleaning of exam room while observing appropriate contact time as indicated for disinfecting solutions.

## 2022-06-19 NOTE — Assessment & Plan Note (Signed)
Doing well with current strength of levothyroxine. Lab Results  Component Value Date   TSH 0.721 03/21/2022

## 2022-06-19 NOTE — Assessment & Plan Note (Signed)
Management per rheumatology.  Remains on low-dose prednisone and methotrexate.

## 2022-06-19 NOTE — Assessment & Plan Note (Signed)
He remains on Eliquis.  Will need this indefinitely.  Continue at current strength.

## 2022-06-26 DIAGNOSIS — E1142 Type 2 diabetes mellitus with diabetic polyneuropathy: Secondary | ICD-10-CM | POA: Diagnosis not present

## 2022-06-26 DIAGNOSIS — R531 Weakness: Secondary | ICD-10-CM | POA: Diagnosis not present

## 2022-06-26 DIAGNOSIS — Z79631 Long term (current) use of antimetabolite agent: Secondary | ICD-10-CM | POA: Diagnosis not present

## 2022-06-26 DIAGNOSIS — G729 Myopathy, unspecified: Secondary | ICD-10-CM | POA: Diagnosis not present

## 2022-06-26 DIAGNOSIS — R748 Abnormal levels of other serum enzymes: Secondary | ICD-10-CM | POA: Diagnosis not present

## 2022-06-26 DIAGNOSIS — R609 Edema, unspecified: Secondary | ICD-10-CM | POA: Diagnosis not present

## 2022-06-26 DIAGNOSIS — I10 Essential (primary) hypertension: Secondary | ICD-10-CM | POA: Diagnosis not present

## 2022-06-26 DIAGNOSIS — L89619 Pressure ulcer of right heel, unspecified stage: Secondary | ICD-10-CM | POA: Diagnosis not present

## 2022-06-26 DIAGNOSIS — G7241 Inclusion body myositis [IBM]: Secondary | ICD-10-CM | POA: Diagnosis not present

## 2022-06-26 DIAGNOSIS — L988 Other specified disorders of the skin and subcutaneous tissue: Secondary | ICD-10-CM | POA: Diagnosis not present

## 2022-06-26 DIAGNOSIS — Z7952 Long term (current) use of systemic steroids: Secondary | ICD-10-CM | POA: Diagnosis not present

## 2022-06-29 ENCOUNTER — Telehealth (HOSPITAL_BASED_OUTPATIENT_CLINIC_OR_DEPARTMENT_OTHER): Payer: Self-pay | Admitting: Family

## 2022-06-29 NOTE — Telephone Encounter (Signed)
Left message for patient to call and discuss rescheduling the 07/05/22 appointmet wit CaitlnWalker, NP---provider no I the office

## 2022-07-02 ENCOUNTER — Encounter (HOSPITAL_BASED_OUTPATIENT_CLINIC_OR_DEPARTMENT_OTHER): Payer: Self-pay

## 2022-07-02 ENCOUNTER — Ambulatory Visit (HOSPITAL_BASED_OUTPATIENT_CLINIC_OR_DEPARTMENT_OTHER): Payer: Medicare Other | Admitting: Family

## 2022-07-02 NOTE — Telephone Encounter (Signed)
Left message for patient to call and reschedule 07/05/22 appt with Laurann Montana, NP---provider is not in the office kf

## 2022-07-05 ENCOUNTER — Ambulatory Visit (HOSPITAL_BASED_OUTPATIENT_CLINIC_OR_DEPARTMENT_OTHER): Payer: Medicare Other | Admitting: Family

## 2022-07-10 IMAGING — CT CT ANGIO CHEST
2 of 6 series · 17 of 36 positions shown · IV contrast (Omnipaque)
Comparison: None.

CLINICAL DATA: Pulmonary embolism suspected



[Series 4: pe coronal mpr · coronal · 0.60mm/px · 1 of 151 slices shown]
[im 76/151  mediastinal]
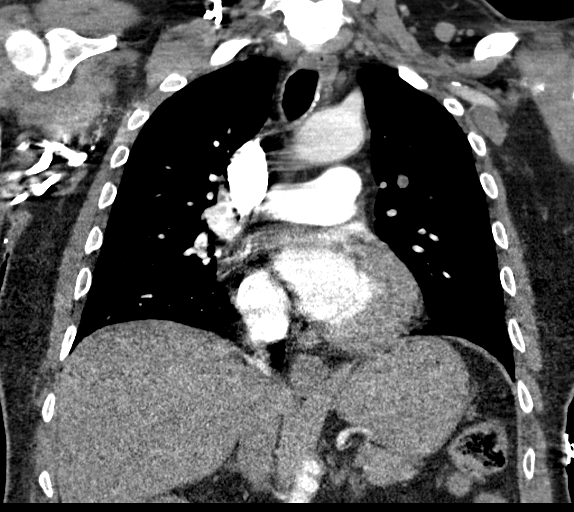

[Series 8: pe thins · axial · 0.98mm/px · z∈[-296,-26]mm · 16 of 301 slices shown]
[im 16/301  lung]
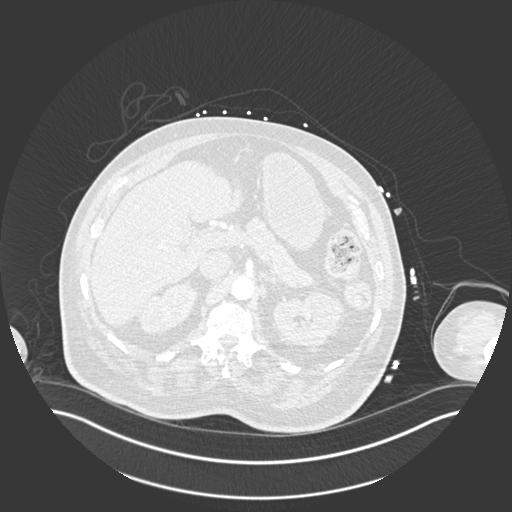
[im 31/301  mediastinal]
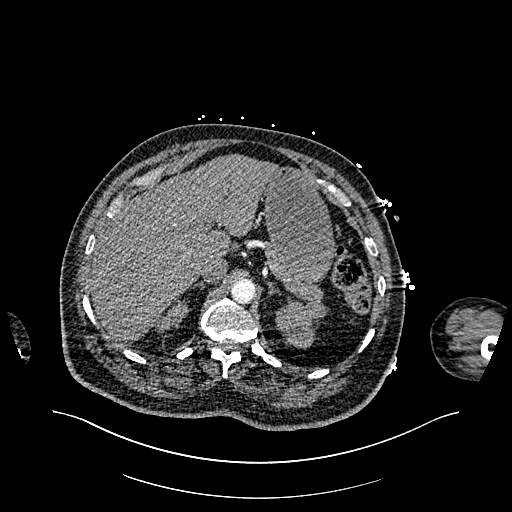
[im 46/301  lung]
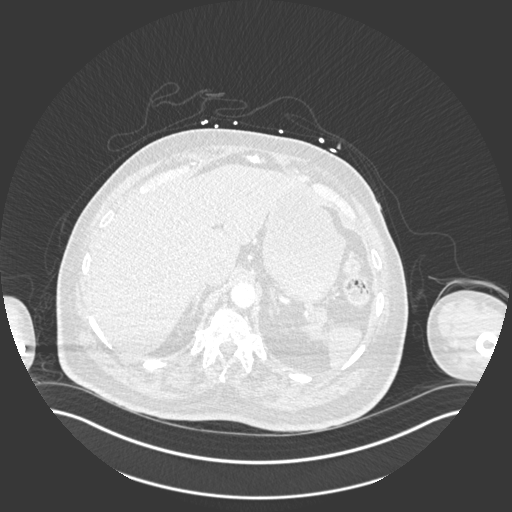
[im 76/301  mediastinal]
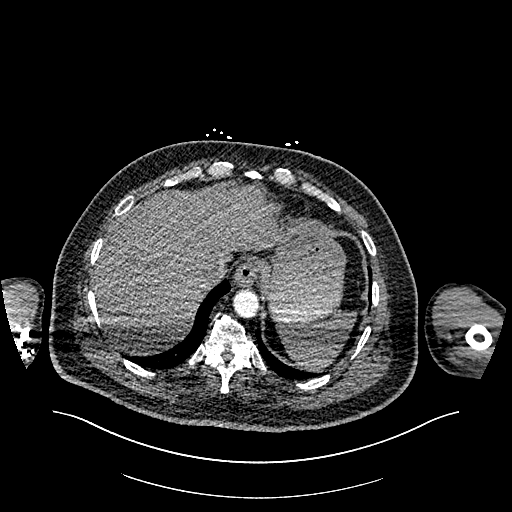
[im 91/301  lung]
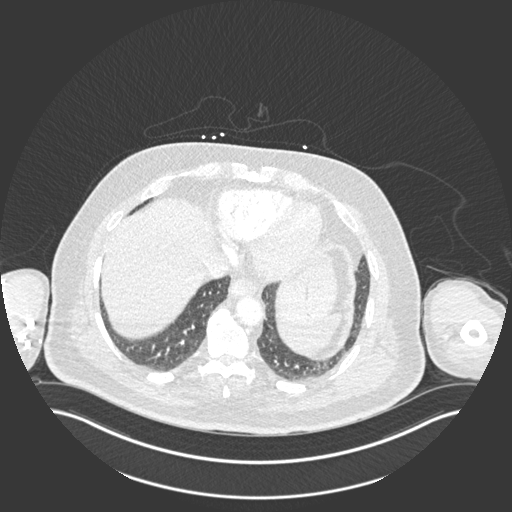
[im 106/301  mediastinal]
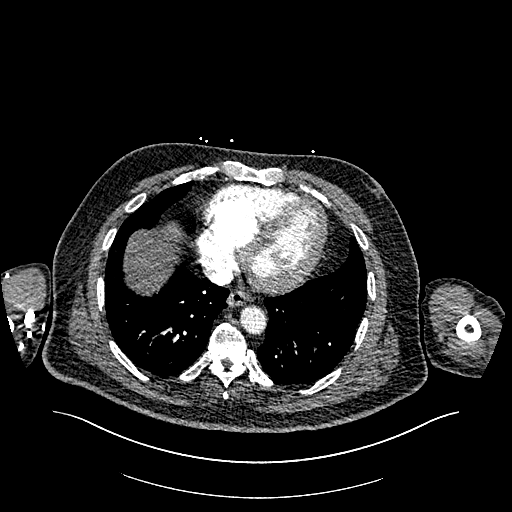
[im 121/301  lung]
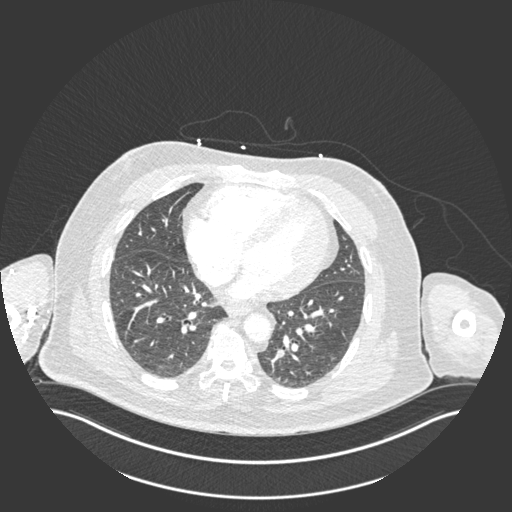
[im 136/301  mediastinal]
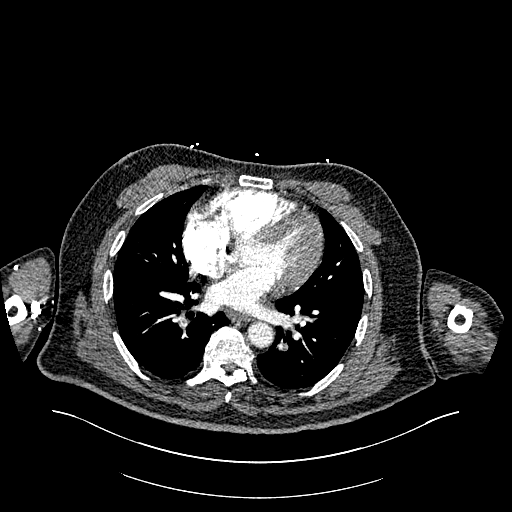
[im 166/301  lung]
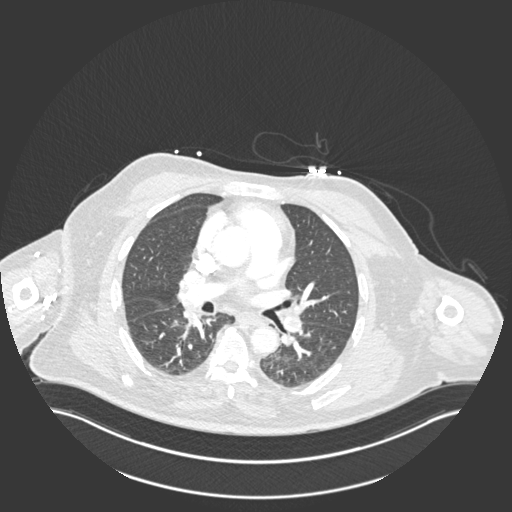
[im 181/301  mediastinal]
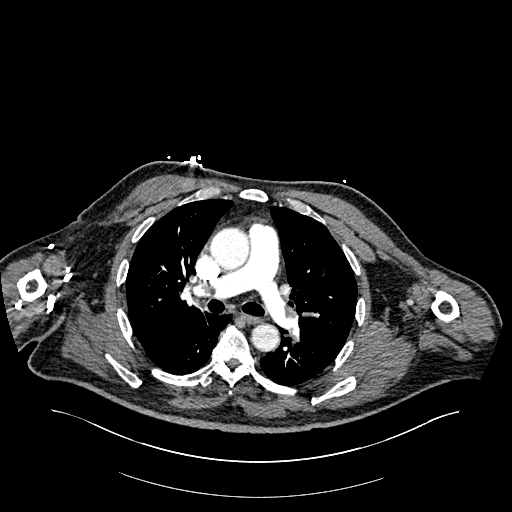
[im 196/301  lung]
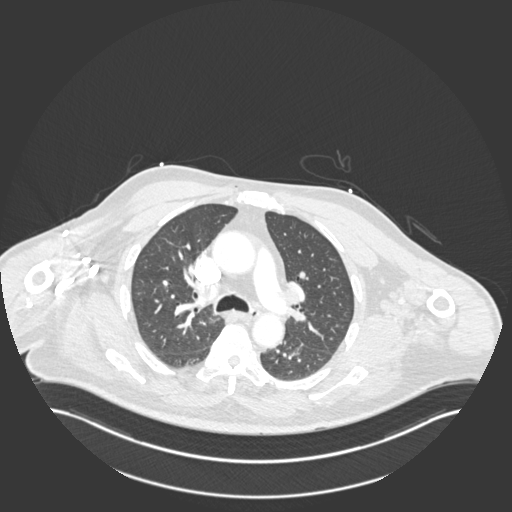
[im 211/301  mediastinal]
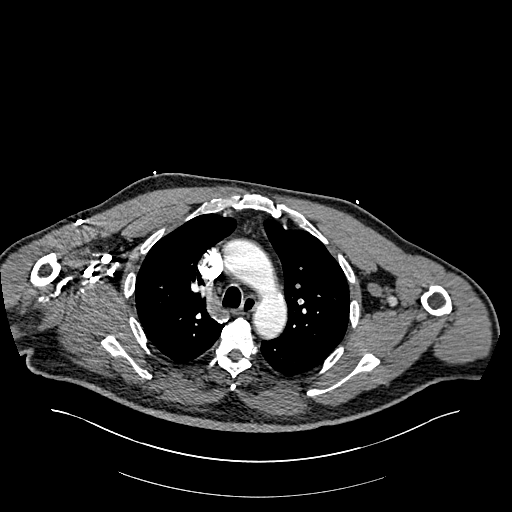
[im 226/301  lung]
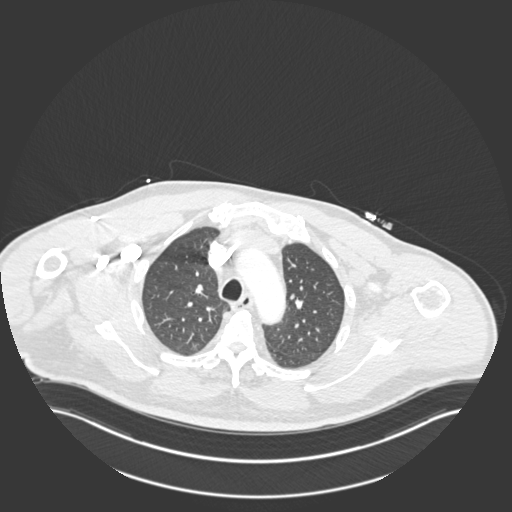
[im 256/301  mediastinal]
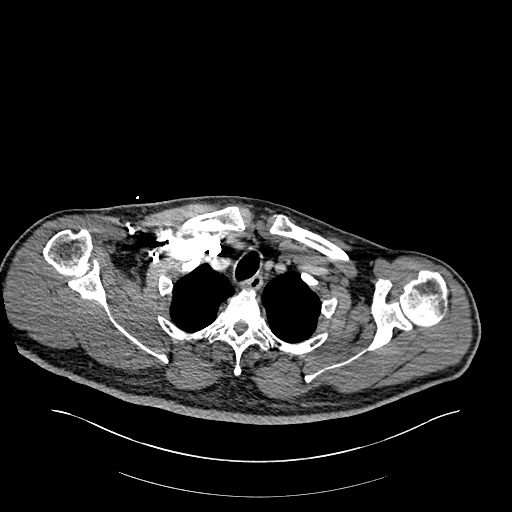
[im 271/301  lung]
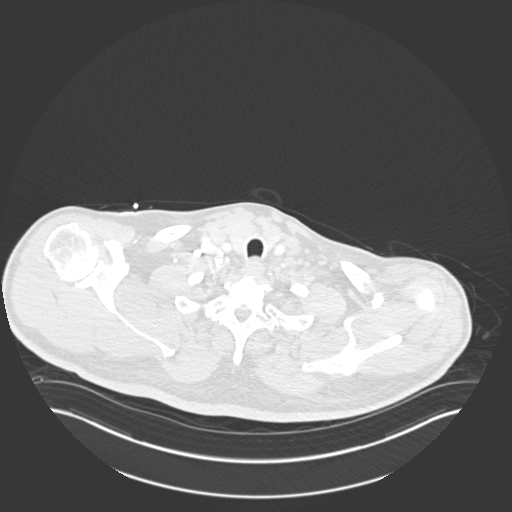
[im 286/301  mediastinal]
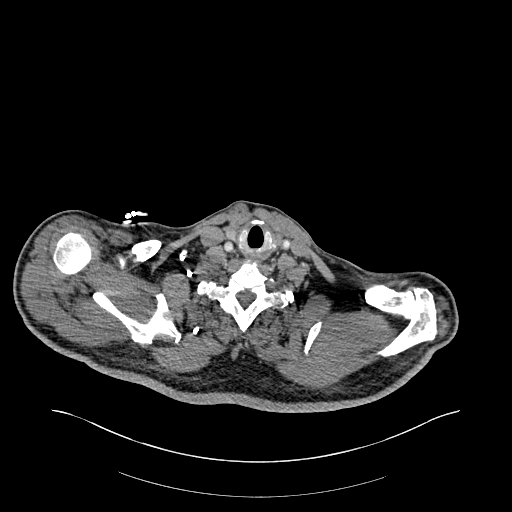

[17 of 36 positions shown; findings below may reference images not displayed]

RADIATION DOSE REDUCTION: This exam was performed according to the
departmental dose-optimization program which includes automated
exposure control, adjustment of the mA and/or kV according to
patient size and/or use of iterative reconstruction technique.

CONTRAST:  100mL OMNIPAQUE IOHEXOL 350 MG/ML SOLN
FINDINGS: CTA CHEST FINDINGS

Cardiovascular: There is a saddlepulmonary embolus that extends into
segmental branches bilaterally. There is evidence of right heart
strain within RV/LV ratio of 1.35. The aorta is normal aside from
mild atherosclerotic calcification.

Mediastinum/Nodes: No mediastinal, hilar or axillary
lymphadenopathy. The visualized thyroid and thoracic esophageal
course are unremarkable.

Lungs/Pleura: No pulmonary nodules or masses. No pleural effusion or
pneumothorax. No focal airspace consolidation. No focal pleural
abnormality.

Musculoskeletal: No chest wall abnormality. No acute or significant
osseous findings.

Review of the MIP images confirms the above findings.

CT ABDOMEN and PELVIS FINDINGS

Hepatobiliary: Normal hepatic contours and density. No visible
biliary dilatation. Normal gallbladder.

Pancreas: Normal contours without ductal dilatation. No
peripancreatic fluid collection.

Spleen: Normal.

Adrenals/Urinary Tract:

--Adrenal glands: Normal.

--Right kidney/ureter: No hydronephrosis or perinephric stranding.
No nephrolithiasis. No obstructing ureteral stones.

--Left kidney/ureter: No hydronephrosis or perinephric stranding. No
nephrolithiasis. No obstructing ureteral stones.

--Urinary bladder: Unremarkable.

Stomach/Bowel:

--Stomach/Duodenum: No hiatal hernia or other gastric abnormality.
Normal duodenal course and caliber.

--Small bowel: No dilatation or inflammation.

--Colon: No focal abnormality.

--Appendix: Normal.

Vascular/Lymphatic: Normal course and caliber of the major abdominal
vessels. No abdominal or pelvic lymphadenopathy.

Reproductive: No free fluid in the pelvis.

Musculoskeletal. No bony spinal canal stenosis or focal osseous
abnormality.

Other: None.
IMPRESSION: 1. Saddle pulmonary embolus with CT evidence of right heart strain
(RV/LV Ratio = 1.35) consistent with at least submassive
(intermediate risk) PE. The presence of right heart strain has been
associated with an increased risk of morbidity and mortality. Please
refer to the "PE Focused" order set in [REDACTED].
2. No acute abnormality of the abdomen or pelvis.

Critical Value/emergent results were called by telephone at the time
of interpretation on 07/21/2021 at [DATE] to provider JUMPER
JOSE TRIPLETA , who verbally acknowledged these results.

Aortic Atherosclerosis ([id number]).

## 2022-07-10 IMAGING — CT CT ABD-PELV W/ CM
2 of 7 series · 15 of 46 positions shown, 17 images · IV contrast (Omnipaque)
Comparison: None.

CLINICAL DATA: Pulmonary embolism suspected



[Series 4: axial st · axial · 0.90mm/px · z∈[-627,-202]mm · 12 of 97 slices shown, 14 images]
[im 6/97  soft-tissue]
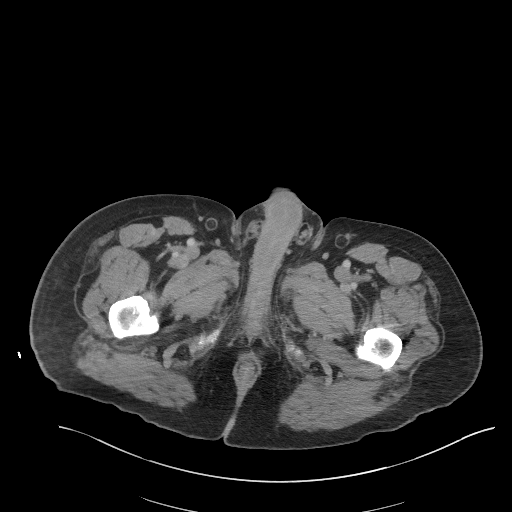
[im 6/97  bone]
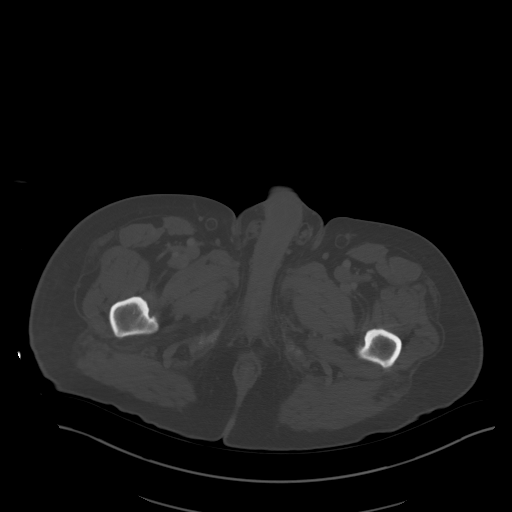
[im 17/97  soft-tissue]
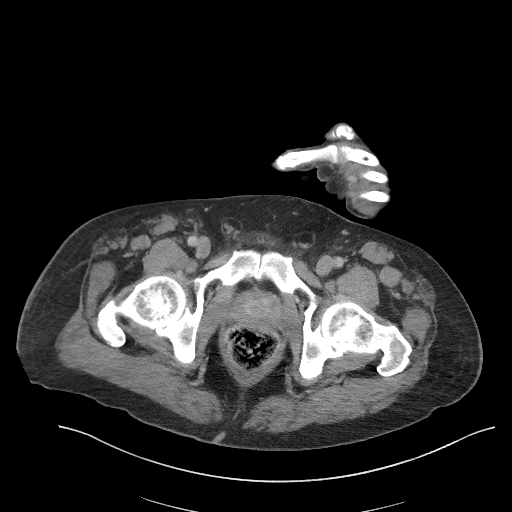
[im 23/97  soft-tissue]
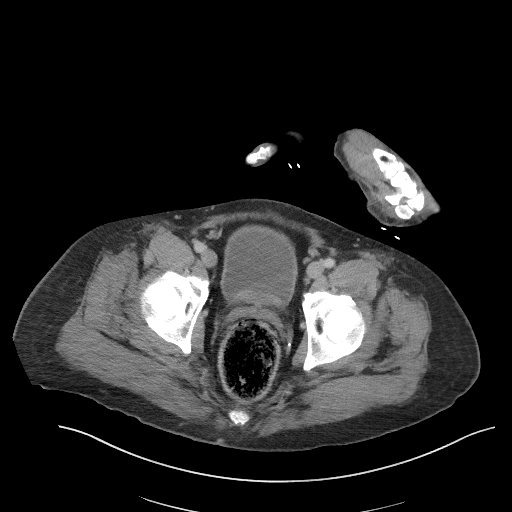
[im 29/97  soft-tissue]
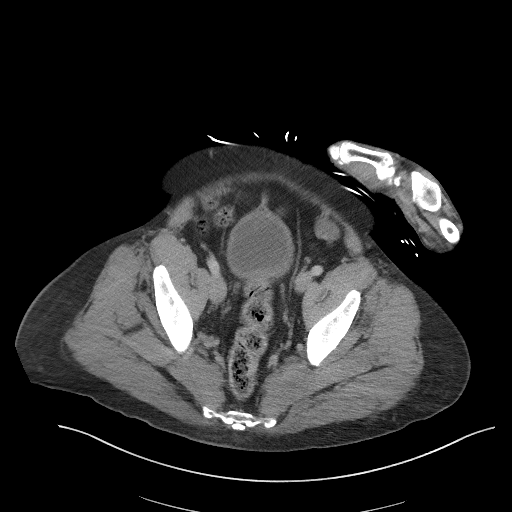
[im 40/97  soft-tissue]
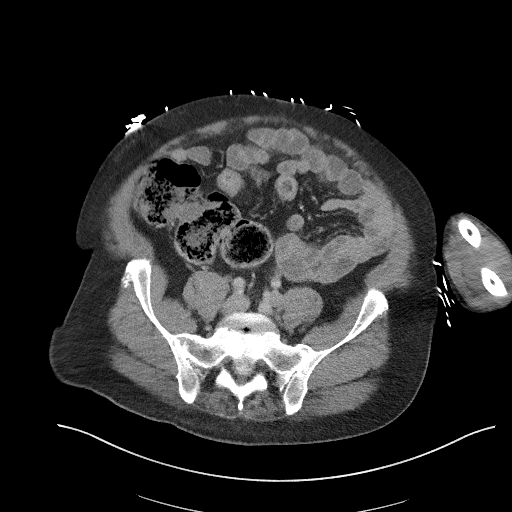
[im 46/97  soft-tissue]
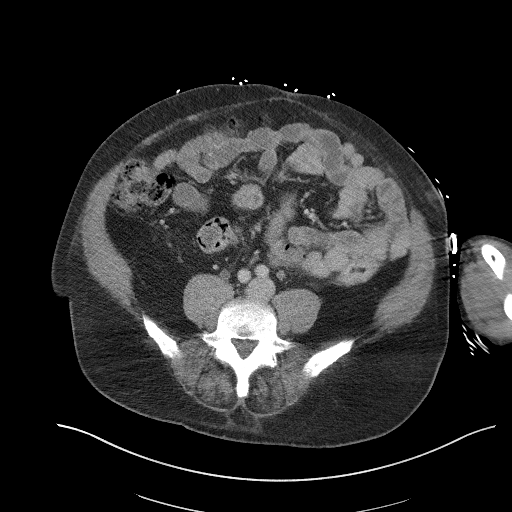
[im 51/97  soft-tissue]
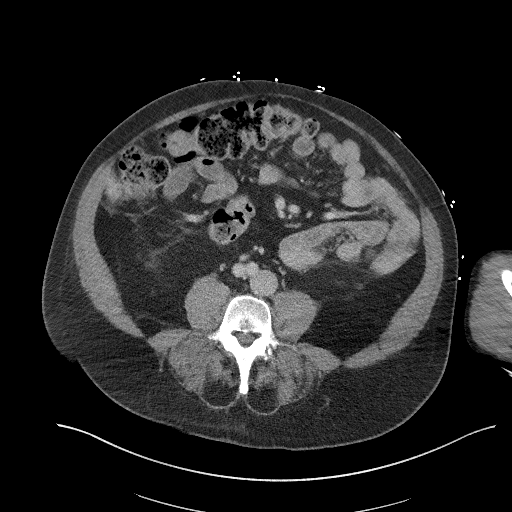
[im 63/97  soft-tissue]
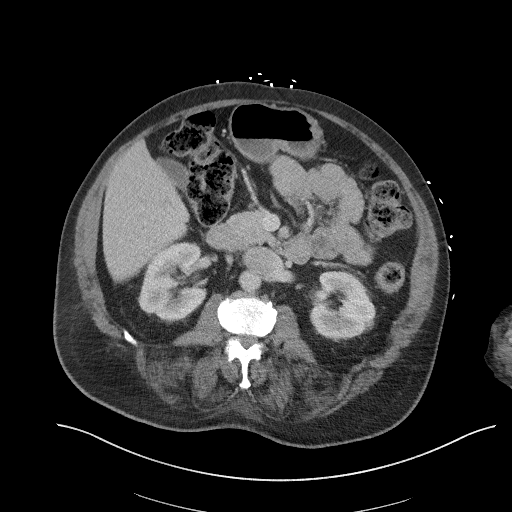
[im 68/97  soft-tissue]
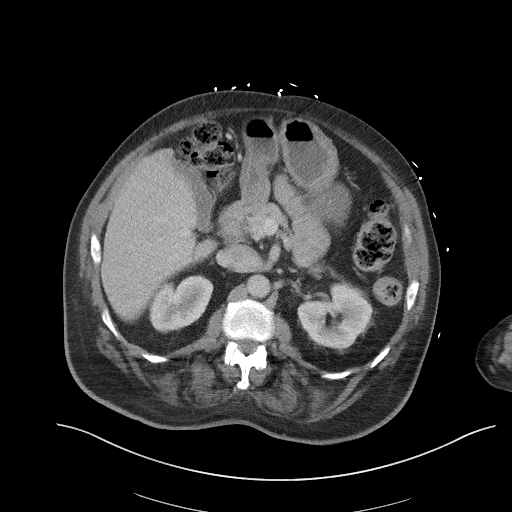
[im 68/97  bone]
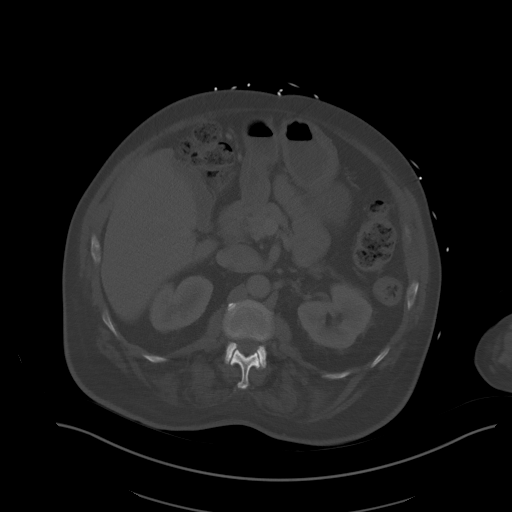
[im 74/97  soft-tissue]
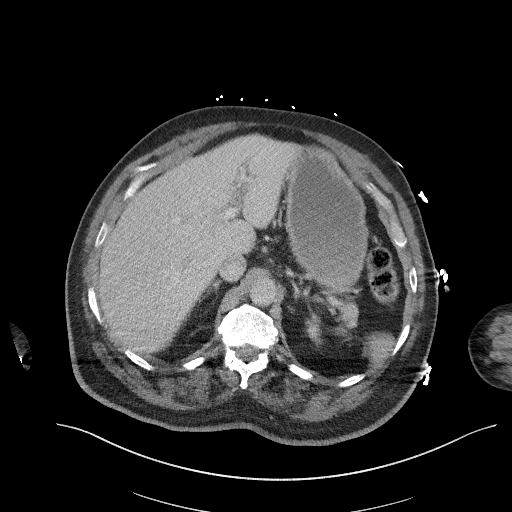
[im 85/97  soft-tissue]
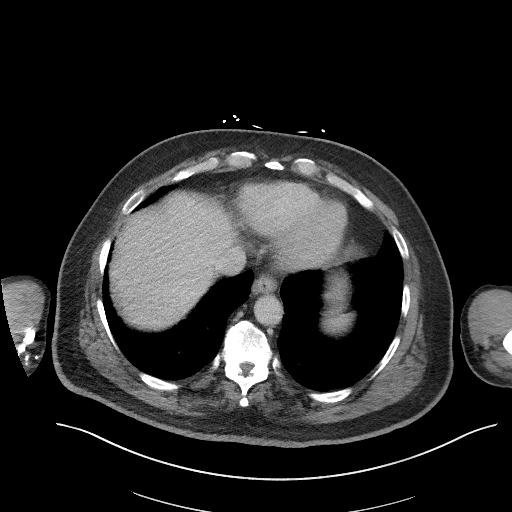
[im 91/97  soft-tissue]
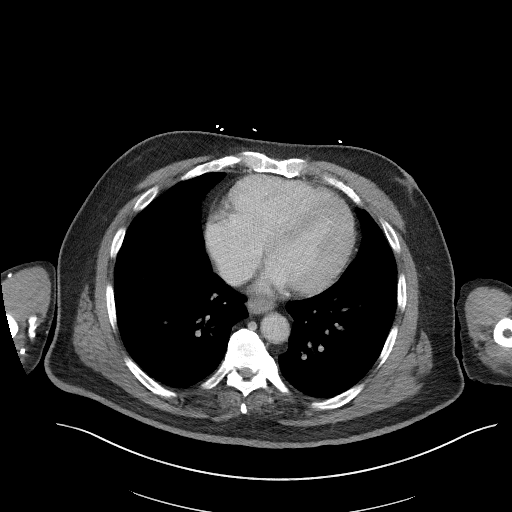

[Series 7: abd/pel coronal st · coronal · 0.82mm/px · 3 of 119 slices shown]
[im 40/119  soft-tissue]
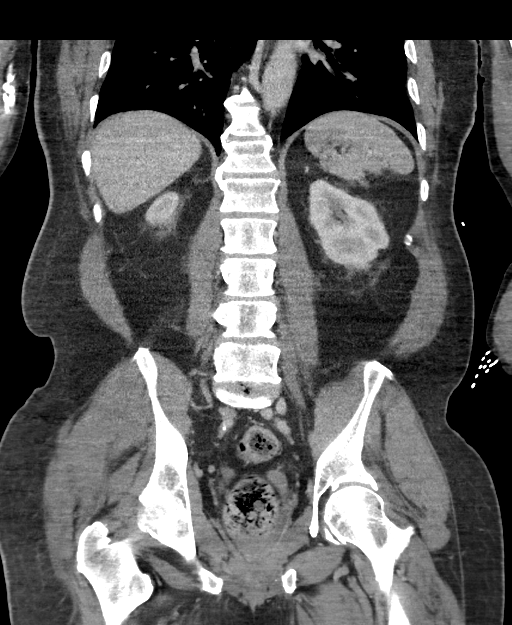
[im 53/119  soft-tissue]
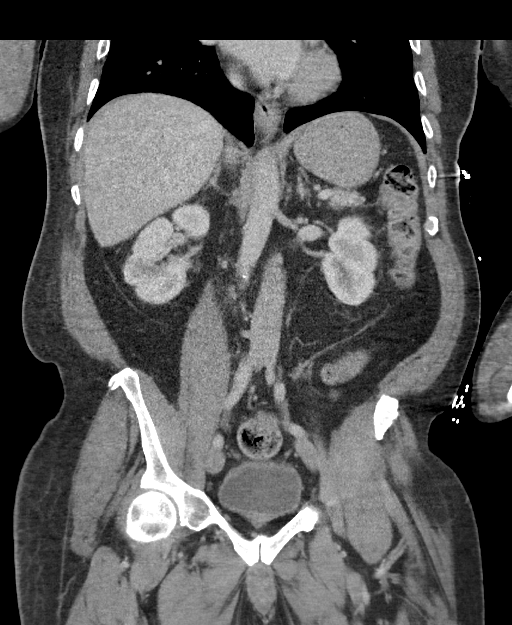
[im 66/119  soft-tissue]
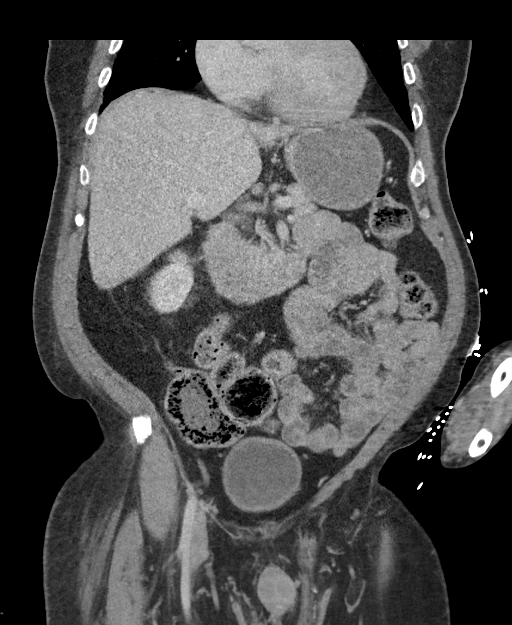

[15 of 46 positions shown; findings below may reference images not displayed]

RADIATION DOSE REDUCTION: This exam was performed according to the
departmental dose-optimization program which includes automated
exposure control, adjustment of the mA and/or kV according to
patient size and/or use of iterative reconstruction technique.

CONTRAST:  100mL OMNIPAQUE IOHEXOL 350 MG/ML SOLN
FINDINGS: CTA CHEST FINDINGS

Cardiovascular: There is a saddlepulmonary embolus that extends into
segmental branches bilaterally. There is evidence of right heart
strain within RV/LV ratio of 1.35. The aorta is normal aside from
mild atherosclerotic calcification.

Mediastinum/Nodes: No mediastinal, hilar or axillary
lymphadenopathy. The visualized thyroid and thoracic esophageal
course are unremarkable.

Lungs/Pleura: No pulmonary nodules or masses. No pleural effusion or
pneumothorax. No focal airspace consolidation. No focal pleural
abnormality.

Musculoskeletal: No chest wall abnormality. No acute or significant
osseous findings.

Review of the MIP images confirms the above findings.

CT ABDOMEN and PELVIS FINDINGS

Hepatobiliary: Normal hepatic contours and density. No visible
biliary dilatation. Normal gallbladder.

Pancreas: Normal contours without ductal dilatation. No
peripancreatic fluid collection.

Spleen: Normal.

Adrenals/Urinary Tract:

--Adrenal glands: Normal.

--Right kidney/ureter: No hydronephrosis or perinephric stranding.
No nephrolithiasis. No obstructing ureteral stones.

--Left kidney/ureter: No hydronephrosis or perinephric stranding. No
nephrolithiasis. No obstructing ureteral stones.

--Urinary bladder: Unremarkable.

Stomach/Bowel:

--Stomach/Duodenum: No hiatal hernia or other gastric abnormality.
Normal duodenal course and caliber.

--Small bowel: No dilatation or inflammation.

--Colon: No focal abnormality.

--Appendix: Normal.

Vascular/Lymphatic: Normal course and caliber of the major abdominal
vessels. No abdominal or pelvic lymphadenopathy.

Reproductive: No free fluid in the pelvis.

Musculoskeletal. No bony spinal canal stenosis or focal osseous
abnormality.

Other: None.
IMPRESSION: 1. Saddle pulmonary embolus with CT evidence of right heart strain
(RV/LV Ratio = 1.35) consistent with at least submassive
(intermediate risk) PE. The presence of right heart strain has been
associated with an increased risk of morbidity and mortality. Please
refer to the "PE Focused" order set in [REDACTED].
2. No acute abnormality of the abdomen or pelvis.

Critical Value/emergent results were called by telephone at the time
of interpretation on 07/21/2021 at [DATE] to provider JUMPER
JOSE TRIPLETA , who verbally acknowledged these results.

Aortic Atherosclerosis ([id number]-WN5.5).

## 2022-07-10 IMAGING — CT CT HEAD W/O CM
3 series · 15 of 47 positions shown, 18 images · non-contrast
Comparison: 02/22/2020

CLINICAL DATA: Altered mental status



[Series 2: head wo · axial · 0.44mm/px · z∈[-129,+11]mm · 9 of 34 slices shown, 12 images]
[im 3/34  brain]
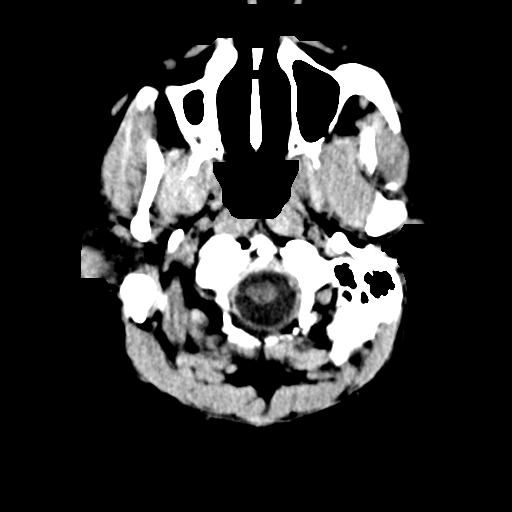
[im 3/34  bone]
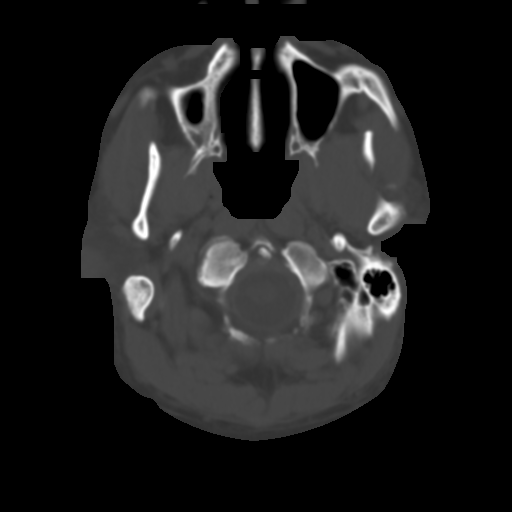
[im 6/34  brain]
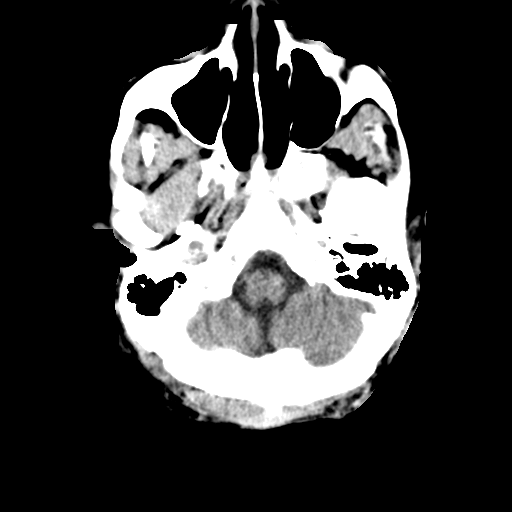
[im 10/34  brain]
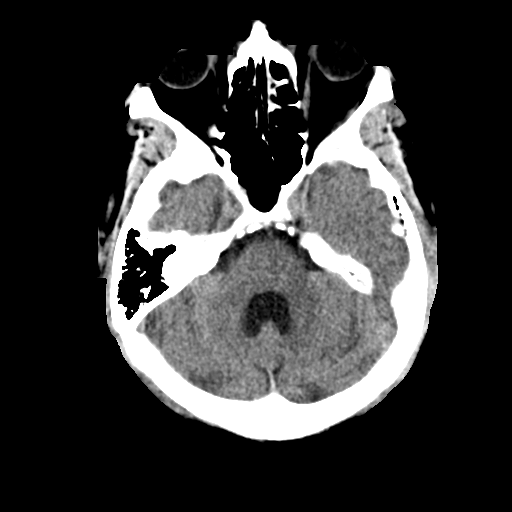
[im 13/34  brain]
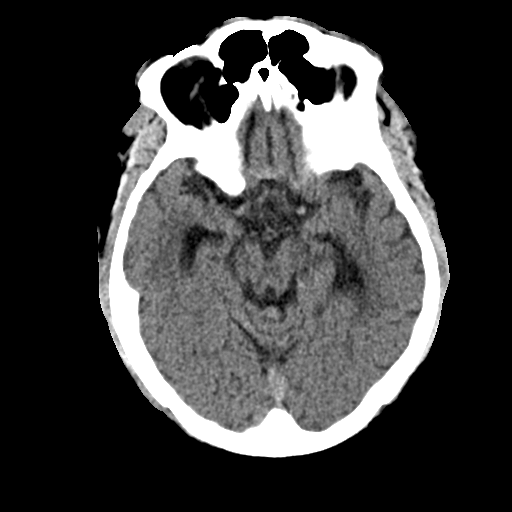
[im 18/34  brain]
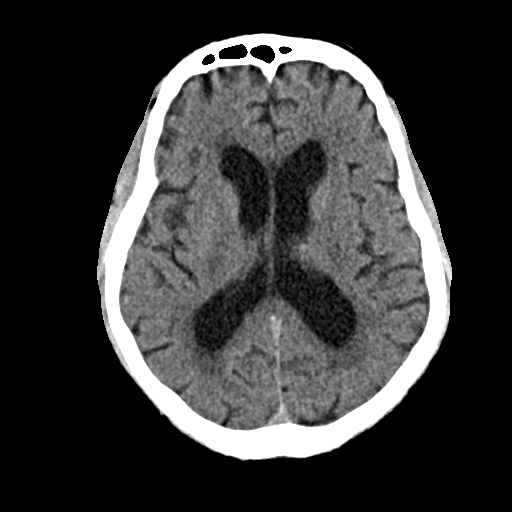
[im 18/34  bone]
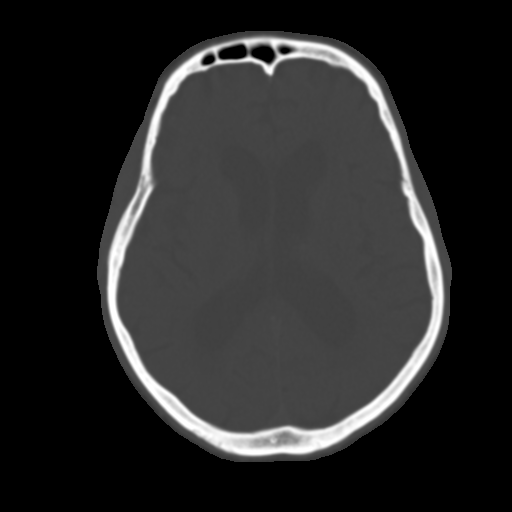
[im 21/34  brain]
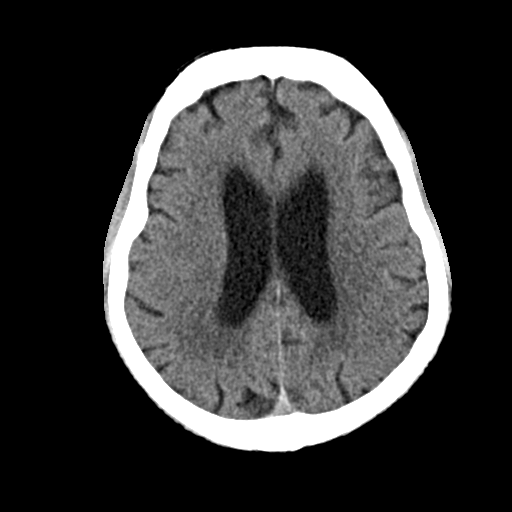
[im 24/34  brain]
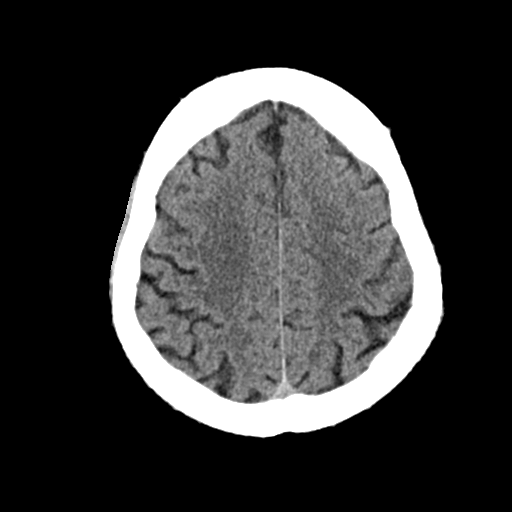
[im 28/34  brain]
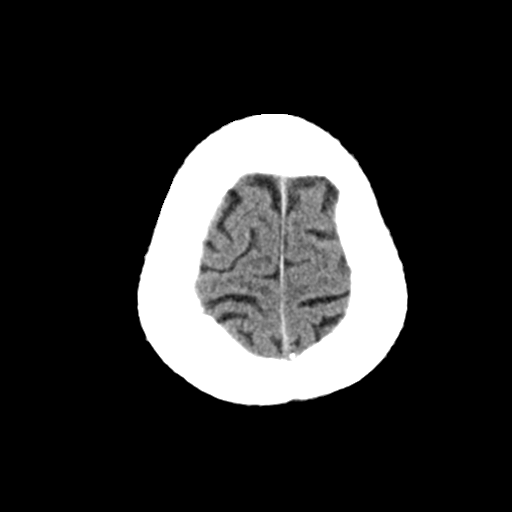
[im 31/34  brain]
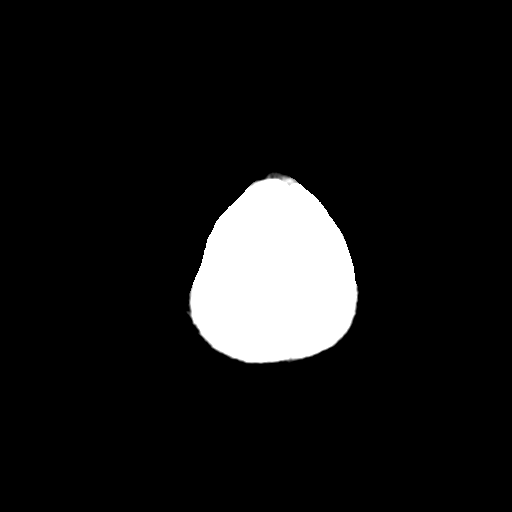
[im 31/34  bone]
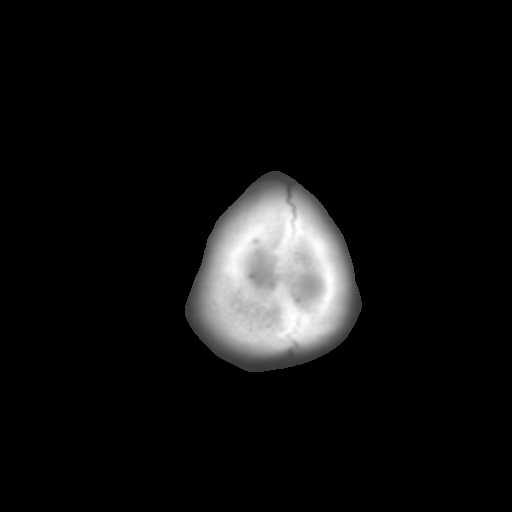

[Series 4: coronal soft · coronal · 0.33mm/px · 3 of 71 slices shown]
[im 24/71  brain]
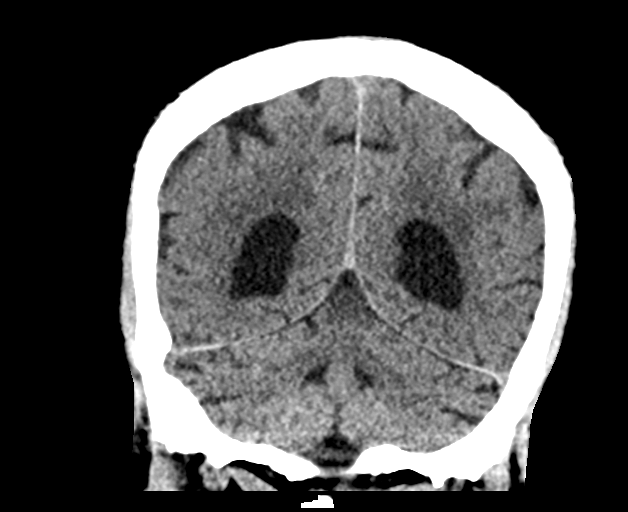
[im 32/71  brain]
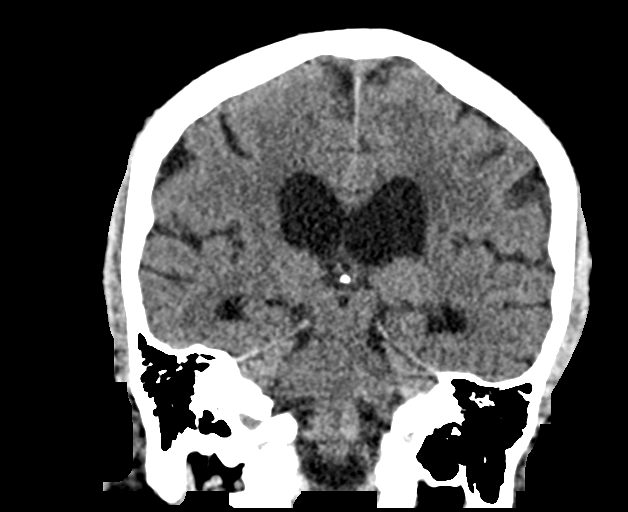
[im 39/71  brain]
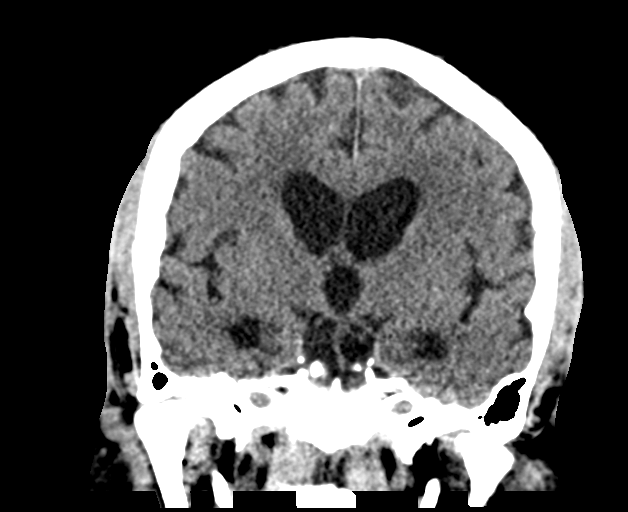

[Series 5: sag soft · sagittal · 0.32mm/px · 3 of 67 slices shown]
[im 23/67  brain]
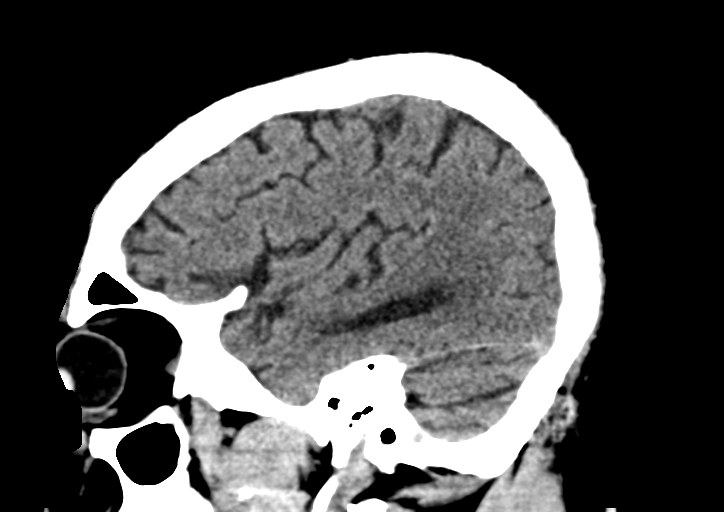
[im 34/67  brain]
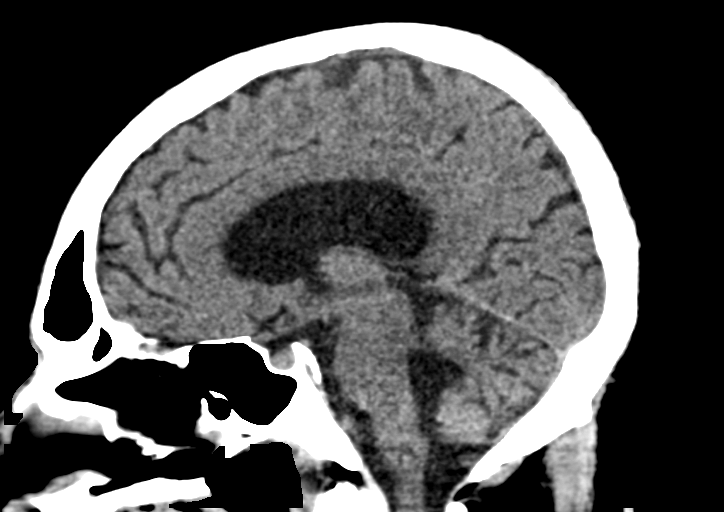
[im 45/67  brain]
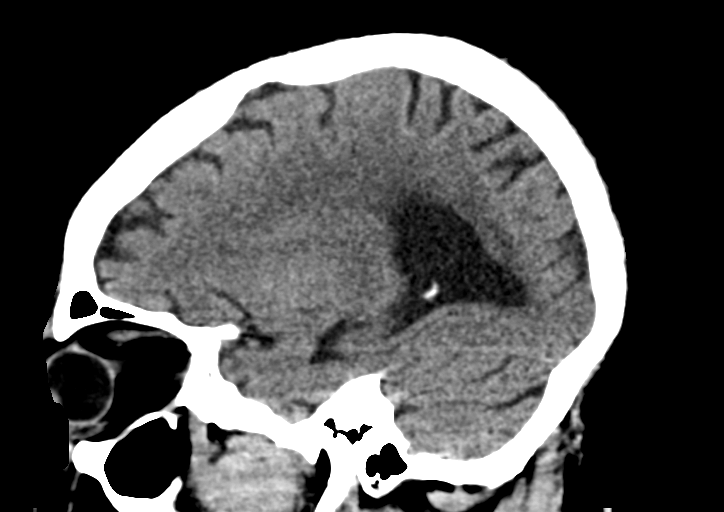

[15 of 47 positions shown; findings below may reference images not displayed]

FINDINGS: Brain: No acute intracranial findings are seen. There is prominence
of third and both lateral ventricles with no significant interval
change. Cortical sulci are prominent.

Vascular: Unremarkable.

Skull: No fracture is seen in the calvarium.

Sinuses/Orbits: There is mucosal thickening in the ethmoid sinus.

Other: None
IMPRESSION: No acute intracranial findings are seen in noncontrast CT brain.

Chronic ethmoid sinusitis.

## 2022-07-26 DIAGNOSIS — H401134 Primary open-angle glaucoma, bilateral, indeterminate stage: Secondary | ICD-10-CM | POA: Diagnosis not present

## 2022-08-07 DIAGNOSIS — G7241 Inclusion body myositis [IBM]: Secondary | ICD-10-CM | POA: Diagnosis not present

## 2022-08-07 DIAGNOSIS — I890 Lymphedema, not elsewhere classified: Secondary | ICD-10-CM | POA: Diagnosis not present

## 2022-08-07 DIAGNOSIS — L89619 Pressure ulcer of right heel, unspecified stage: Secondary | ICD-10-CM | POA: Diagnosis not present

## 2022-08-07 DIAGNOSIS — R609 Edema, unspecified: Secondary | ICD-10-CM | POA: Diagnosis not present

## 2022-08-09 ENCOUNTER — Encounter (HOSPITAL_BASED_OUTPATIENT_CLINIC_OR_DEPARTMENT_OTHER): Payer: Self-pay | Admitting: Family

## 2022-08-09 ENCOUNTER — Ambulatory Visit (INDEPENDENT_AMBULATORY_CARE_PROVIDER_SITE_OTHER): Payer: Medicare Other | Admitting: Family

## 2022-08-09 VITALS — BP 109/70 | HR 95 | Ht 68.0 in | Wt 222.5 lb

## 2022-08-09 DIAGNOSIS — R600 Localized edema: Secondary | ICD-10-CM | POA: Diagnosis not present

## 2022-08-09 DIAGNOSIS — I5032 Chronic diastolic (congestive) heart failure: Secondary | ICD-10-CM | POA: Diagnosis not present

## 2022-08-09 DIAGNOSIS — Z86711 Personal history of pulmonary embolism: Secondary | ICD-10-CM | POA: Diagnosis not present

## 2022-08-09 DIAGNOSIS — I890 Lymphedema, not elsewhere classified: Secondary | ICD-10-CM

## 2022-08-09 DIAGNOSIS — I25118 Atherosclerotic heart disease of native coronary artery with other forms of angina pectoris: Secondary | ICD-10-CM

## 2022-08-09 DIAGNOSIS — E785 Hyperlipidemia, unspecified: Secondary | ICD-10-CM

## 2022-08-09 DIAGNOSIS — Z7901 Long term (current) use of anticoagulants: Secondary | ICD-10-CM | POA: Diagnosis not present

## 2022-08-09 MED ORDER — TORSEMIDE 20 MG PO TABS: 20.0000 mg | ORAL_TABLET | Freq: Every day | ORAL | 1 refills | Status: DC

## 2022-08-09 MED ORDER — APIXABAN 5 MG PO TABS: 5.0000 mg | ORAL_TABLET | Freq: Two times a day (BID) | ORAL | 11 refills | Status: DC

## 2022-08-09 MED ORDER — APIXABAN 5 MG PO TABS: 5.0000 mg | ORAL_TABLET | Freq: Two times a day (BID) | ORAL | 11 refills | Status: AC

## 2022-08-09 NOTE — Patient Instructions (Addendum)
Medication Instructions:  Your physician recommends that you continue on your current medications as directed. Please refer to the Current Medication list given to you today.  *If you need a refill on your cardiac medications before your next appointment, please call your pharmacy*   Lab Work: Your physician recommends that you return for lab work today- Direct LDL  If you have labs (blood work) drawn today and your tests are completely normal, you will receive your results only by: MyChart Message (if you have MyChart) OR A paper copy in the mail If you have any lab test that is abnormal or we need to change your treatment, we will call you to review the results.  Follow-Up: At [facility], you and your health needs are our priority.  As part of our continuing mission to provide you with exceptional heart care, we have created designated Provider Care Teams.  These Care Teams include your primary Cardiologist (physician) and Advanced Practice Providers (APPs -  Physician Assistants and Nurse Practitioners) who all work together to provide you with the care you need, when you need it.  We recommend signing up for the patient portal called "MyChart".  Sign up information is provided on this After Visit Summary.  MyChart is used to connect with patients for Virtual Visits (Telemedicine).  Patients are able to view lab/test results, encounter notes, upcoming appointments, etc.  Non-urgent messages can be sent to your provider as well.   To learn more about what you can do with MyChart, go to [email or web address].    Your next appointment:   4-6 month(s)  Provider:   Gillian Shields, NP    Other Instructions Recommend continuing to wear knee high or thigh high zip up compression stockings 15-71mmHg or as directed by lyphedema clinic Recommend inquiring with PCP or [facility] or lymphedema provider about lymphedema pumps  To prevent or reduce lower extremity swelling: Eat a low salt  diet. Salt makes the body hold onto extra fluid which causes swelling. Sit with legs elevated. For example, in the recliner or on an ottoman.  Wear knee-high compression stockings during the daytime. Ones labeled 15-20 mmHg provide good compression.

## 2022-08-09 NOTE — Progress Notes (Signed)
Office Visit    Patient Name: [name] Date of Encounter: 08/09/2022  PCP:  Everrett CoombeMatthews, Cody, DO   [facility]  Cardiologist:  Chrystie NoseKenneth C Hilty, MD  Advanced Practice Provider:  No care team member to display Electrophysiologist:  None    Chief Complaint    [name] is a 72 y.o. male  presents today for LE edema  Past Medical History    Past Medical History:  Diagnosis Date   Autoimmune disease    Bronchitis    Chronic hiccups    Diabetes mellitus without complication    DM2 (diabetes mellitus, type 2) 09/28/2014   GERD (gastroesophageal reflux disease)    Hypogonadism in male 04/01/2018   Hypothyroidism    Joint pain    Necrotizing myopathy    Posttraumatic stress disorder 01/30/2022   PTSD (post-traumatic stress disorder)    Rheumatoid arthritis 08/09/2021   RSV (respiratory syncytial virus pneumonia) 03/11/2022   Past Surgical History:  Procedure Laterality Date   BIOPSY SHOULDER     Left   IR ANGIOGRAM PULMONARY BILATERAL SELECTIVE  07/22/2021   IR THROMBECT VENO MECH MOD SED  07/22/2021   IR US GUIDE VASC ACCESS LEFT  07/22/2021   RADIOLOGY WITH ANESTHESIA Right 07/22/2021   Procedure: IR WITH ANESTHESIA;  Surgeon: Radiologist, Medication, MD;  Location: [facility] OR;  Service: Radiology;  Laterality: Right;   UPPER LEG SOFT TISSUE BIOPSY Right     Allergies  Allergies  Allergen Reactions   Benazepril Hives and Cough   Gabapentin Swelling   Hydrocodone Hives and Itching   Shellfish Allergy Diarrhea and Nausea And Vomiting    History of Present Illness    [name] is a 72 y.o. male with a hx of hypertension, DM2, nonobstructive CAD, autoimmune myositis, PTSD, PE, DVT, HFrEF, CKD 3 last seen 06/05/2022  He was admitted 07/21/2021 and acute PE as well as acute RLE DVT with acute hypoxic respiratory failure in the setting of testosterone injections were discontinued.  Started on heparin and transitioned to Eliquis.  Recommended for  indefinite OAC is not triggered.  Echo  07/23/2021 with LVEF 35-40%, wall motion abnormalities noted, grade 1 diastolic dysfunction, trivial MR, mild AI, mild dilation ascending aorta 41 mm.  Due to AKI he was recommended for outpatient ischemic evaluation.  Seen 08/14/2021 Imdur discontinued due to patient report of headache.  Cardiac CTA 08/28/2021 coronary calcium score of 129 placing him in the 64th percentile for age, gender. LM 1-24% stenosis and ostial/prox LAD mild 25-49% stenosis (FFR 0.88 prox and 0.82 mid - no significant stenosis). For optimization of therapy, 10/09/21 Spironolactone added and 10/27/21 Hydralazine transitioned to [place] BendEntresto. Repeat echo 12/20/21 LVEF 55-60%, stable wall motion abnormalities, gr1DD.   01/09/2022 and Lasix reduced to only as needed due to recovered LVEF and Entresto reduced to half tablet twice daily due to hypotension.  Underwent sleep study 02/20/2022 with moderate OSA recommended for CPAP titration.  Admitted 11/12 - 03/15/2022 with AMS found to have pneumonia as well as heart failure exacerbation requiring diuresis.  He was discharged on torsemide.  Entresto discontinued due to hypotension.  Carvedilol dose reduced.  Echo during that admission with LVEF 55 to 60%, grade 1 diastolic dysfunction, aortic valve sclerosis without stenosis, mild dilation aortic root 41 mm.  He had repeat ED visit after fall 03/16/2022.  Readmitted 11/20 - 03/24/2022 with acute metabolic encephalopathy with hypoglycemia, dehydration.  No evidence of infection.  Spironolactone, torsemide were discontinued.  He saw  PCP 03/29/2022 with Torsemide was resumed. At follow up 04/06/22 Torsemide increased to 20mg  QD due to bilateral LE 2+ pitting edema. He was set up for autoPAP.   He saw primary care 04/16/22 noting confusion, increased edema, gait unsteadiness, hoarseness. MR brain, CXR, LE duplex nonacute. He was recommended to see neurology.   Last seen 06/05/2022 and due to bilateral 2+ pitting  edema was recommended to transition to torsemide 40 mg daily for 3 days then return to 20 mg daily.  Spironolactone 12.5 mg was added.  However he did not stay on long-term.  He presents today for follow-up with his wife. His 7 year old son is a Holiday representative at [employer] with goal of being an MD. Wife notes his fluid status has been better. He has lyphedema physical therapist. He has zip up knee high compression stockings from the [place].  Shares with me that urology stopped statin due to concern for interaction with other medications. Discussed importance of cholesterol control given known CAD.  EKGs/Labs/Other Studies Reviewed:   The following studies were reviewed today:  Echo 03/14/22  1. Left ventricular ejection fraction, by estimation, is 55 to 60%. The  left ventricle has normal function. The left ventricle demonstrates  regional wall motion abnormalities (see scoring diagram/findings for  description). Left ventricular diastolic  parameters are consistent with Grade I diastolic dysfunction (impaired  relaxation).   2. Right ventricular systolic function is normal. The right ventricular  size is normal.   3. The mitral valve is normal in structure. No evidence of mitral valve  regurgitation. No evidence of mitral stenosis.   4. The aortic valve is tricuspid. There is mild calcification of the  aortic valve. There is mild thickening of the aortic valve. Aortic valve  regurgitation is not visualized. Aortic valve sclerosis is present, with  no evidence of aortic valve stenosis.   5. Aortic dilatation noted. There is mild dilatation of the aortic root,  measuring 41 mm.   6. The inferior vena cava is normal in size with greater than 50%  respiratory variability, suggesting right atrial pressure of 3 mmHg.   Comparison(s): Prior images unable to be directly viewed, comparison made  by report only. The left ventricular function has improved.   Echo 11/2021   1. Left ventricular ejection fraction,  by estimation, is 55 to 60%. The  left ventricle has normal function. The left ventricle demonstrates  regional wall motion abnormalities (see scoring diagram/findings for  description). Left ventricular diastolic  parameters are consistent with Grade I diastolic dysfunction (impaired  relaxation). There is mild hypokinesis of the left ventricular, apical  septal wall.   2. Right ventricular systolic function is normal. The right ventricular  size is normal.   3. The mitral valve is normal in structure. No evidence of mitral valve  regurgitation. No evidence of mitral stenosis.   4. The aortic valve is tricuspid. There is mild calcification of the  aortic valve. Aortic valve regurgitation is not visualized. Aortic valve  sclerosis/calcification is present, without any evidence of aortic  stenosis.   5. There is mild dilatation of the ascending aorta, measuring 42 mm.   6. The inferior vena cava is normal in size with greater than 50%  respiratory variability, suggesting right atrial pressure of 3 mmHg.   Comparison(s): The left ventricular function has improved. The left  ventricular wall motion improved.   Cardiac CTA 08/28/21 FINDINGS: Non-cardiac: See separate report from [facility].   Pulmonary veins drain normally to the left  atrium. No LA appendage thrombus noted.   Calcium Score: 129 Agatston units.   Coronary Arteries: Right dominant with no anomalies   LM: Mixed plaque distal left main, mild (1-24%) stenosis.   LAD system: Mixed plaque ostial/proximal LAD, mild (25-49%) stenosis.   Circumflex system: No plaque or stenosis.   RCA system: No plaque or stenosis.   IMPRESSION: 1. Coronary artery calcium score 129 Agatston units. This places the patient in the 64th percentile for age and gender, suggesting intermediate risk for future cardiac events.   2. Suspect mild (25-49%) stenosis in the ostial/proximal LAD. Will send for FFR to confirm.  FFR 1.  Left Main: No significant stenosis.   2. LAD: 0.88 proximal LAD, 0.82 mid LAD. 3. LCX: No significant stenosis. 4. RCA: No significant stenosis.   IMPRESSION: 1. CT FFR analysis didn't show any significant stenosis in the ostial/proximal LAD.  Echo 07/23/21:  1. Left ventricular ejection fraction, by estimation, is 35 to 40%. Left  ventricular ejection fraction by 2D MOD biplane is 35.4 %. The left  ventricle has moderately decreased function. The left ventricle  demonstrates regional wall motion abnormalities  (see scoring diagram/findings for description). Left ventricular diastolic  parameters are consistent with Grade I diastolic dysfunction (impaired  relaxation). There is severe hypokinesis of the left ventricular, entire  inferoseptal wall, inferior wall,  apical segment and inferolateral wall.   2. Poorly visualized RV, however, based on TAPSE and RsVel, the right  ventricular systolic function is normal.   3. The mitral valve is abnormal. Trivial mitral valve regurgitation.   4. The aortic valve is tricuspid. Aortic valve regurgitation is mild.  Aortic valve sclerosis is present, with no evidence of aortic valve  stenosis.   5. Aortic dilatation noted. There is mild dilatation of the ascending  aorta, measuring 41 mm.   6. The inferior vena cava is normal in size with greater than 50%  respiratory variability, suggesting right atrial pressure of 3 mmHg.   EKG: No EKG today  Recent Labs: 03/21/2022: TSH 0.721 03/29/2022: Magnesium 2.2 04/06/2022: BNP 60.2 04/16/2022: ALT 19 06/19/2022: BUN 36; Creat 1.55; Hemoglobin 11.5; Platelets 213; Potassium 4.2; Sodium 139  Recent Lipid Panel    Component Value Date/Time   CHOL 182 07/24/2021 0022   TRIG 132 07/24/2021 0022   HDL 41 07/24/2021 0022   CHOLHDL 4.4 07/24/2021 0022   VLDL 26 07/24/2021 0022   LDLCALC 115 (H) 07/24/2021 0022   LDLCALC 131 (H) 09/22/2019 0858   LDLDIRECT 72.0 04/01/2018 1035    Home  Medications   Current Meds  Medication Sig   acarbose (PRECOSE) 100 MG tablet Take 100 mg by mouth 3 (three) times daily with meals.   acetaminophen (TYLENOL) 500 MG tablet Take 500-1,000 mg by mouth See admin instructions. Take 1000 mg by mouth in the morning and 500 mg at bedtime   AMBULATORY NON FORMULARY MEDICATION Please provide 3 in 1 bedside commode.  Dx:R29.6, M62.81   apixaban (ELIQUIS) 5 MG TABS tablet Take 1 tablet (5 mg total) by mouth 2 (two) times daily.   baclofen (LIORESAL) 20 MG tablet TAKE 1 TABLET BY MOUTH TWICE A DAY   calcium carbonate (OS-CAL) 600 MG TABS tablet Take 600 mg by mouth daily.   Calcium Carbonate Antacid (TUMS PO) Take 3 tablets by mouth 2 (two) times daily as needed (heartburn).   carvedilol (COREG) 6.25 MG tablet Take 1 tablet (6.25 mg total) by mouth 2 (two) times daily.   folic acid (  FOLVITE) 1 MG tablet Take 2 mg by mouth daily.   insulin glargine (LANTUS) 100 UNIT/ML injection Inject 0.24 mLs (24 Units total) into the skin daily.   ipratropium-albuterol (DUONEB) 0.5-2.5 (3) MG/3ML SOLN Use 1 vial (3mL) by nebulization 4 (four) times daily. (Patient taking differently: Take 3 mLs by nebulization as needed (wheezing/SOB).)   Levothyroxine Sodium 75 MCG CAPS Take 75 mcg by mouth daily.   loratadine (CLARITIN) 10 MG tablet Take 10 mg by mouth daily.   methotrexate (RHEUMATREX) 5 MG tablet Take 3 tablets by mouth once a week. Caution: Chemotherapy. Protect from light.   pantoprazole (PROTONIX) 40 MG tablet Take 1 tablet (40 mg total) by mouth as needed. (Patient taking differently: Take 40 mg by mouth daily.)   polyvinyl alcohol (LIQUIFILM TEARS) 1.4 % ophthalmic solution Place 1 drop into both eyes in the morning and at bedtime.   predniSONE (DELTASONE) 10 MG tablet Take 10 mg by mouth daily with breakfast. Take with 5 mg tablet to equal 15 mg   predniSONE (DELTASONE) 5 MG tablet Take 5 mg by mouth daily with breakfast. Take with 10 mg to make a total of 15  mg   PRESCRIPTION MEDICATION Place 1 drop into both eyes in the morning and at bedtime. Thicker Eye Medicine -- Unknown Name -- Unknown Dosing   sertraline (ZOLOFT) 25 MG tablet Take  (one tablet) daily for one week then increase to  daily. (Patient taking differently: Take 50 mg by mouth daily. Take  daily.)   torsemide (DEMADEX) 20 MG tablet Take 1 tablet (20 mg total) by mouth daily.     Review of Systems     All other systems reviewed and are otherwise negative except as noted above.  Physical Exam    VS:  BP 109/70 (BP Location: Left Arm, Patient Position: Sitting, Cuff Size: Normal)   Pulse 95   Ht  (1.727 m)   Wt 222 lb 8 oz (100.9 kg)   SpO2 97%   BMI 33.83 kg/m  , BMI Body mass index is 33.83 kg/m.  Wt Readings from Last 3 Encounters:  08/09/22 222 lb 8 oz (100.9 kg)  06/19/22 212 lb (96.2 kg)  06/05/22 218 lb (98.9 kg)    GEN: Well nourished, overweight, well developed, in no acute distress. HEENT: normal. Neck: Supple, no JVD, carotid bruits, or masses. Cardiac: RRR, no murmurs, rubs, or gallops. No clubbing, cyanosis.  Bilateral lower extremity 2+ pitting edema. Radials/PT 2+ and equal bilaterally.  Respiratory:  Respirations regular and unlabored, clear to auscultation bilaterally. GI: Soft, nontender, nondistended. MS: No deformity or atrophy. Skin: Warm and dry, no rash. Neuro:  Strength and sensation are intact. Psych: Normal affect.  Assessment & Plan    Lymphedema-following with lymphedema clinic.  Would benefit from lymphedema pump.  Will provide DME Rx.  PE / Hx of DVT - In setting of testosterone injections, admission 06/2021 with acute saddle PE with right heart strain and right popliteal s/p thrombectomy 07/22/21.  Continue Eliquis 5 mg twice daily.  Will require lifelong anticoagulation.  Does not meet dose reduction criteria.  Denies bleeding complications.    HTN - BP well controlled. Continue current antihypertensive regimen.   Entresto previously discontinued due to hypotension. Discussed to monitor BP at home at least 2 hours after medications and sitting for 5-10 minutes.   Nonobstructive CAD - Cardiac CTA 08/28/21 moderate nonobstructive disease. No anginal symptoms. Continue Coreg. No ASA due to [facility]. Heart healthy diet and regular  cardiovascular exercise encouraged.  Per his report statin stopped by urology, will request records. Update direct ldl today. I do not see contraindication for statin but could utilize PCSK9i if needed  CKD3a - Careful titration of diuretic and antihypertensive.   OSA - CPAP compliance encouraged.   Combined heart failure with recovered LVEF - LVEF 35-40% in setting of PE. 11/2021 and 02/2022 repeat echo with LVEF returned to normal 55-60%. GDMT includes Coreg, Torsemide. Entresto previously stopped due to hypotension. Patient has been fitted for compression hoses at the [place]. Encouraged to continue elevating legs at rest. Recommended to weigh daily and keep log for future follow-up. Encouraged to call office if you have weight gain of 2 pounds overnight of 5 pounds in 1 week.  Could consider Farxiga at follow up for further optimization of medical therapy if needed.  GERD - Continue to follow with PCP.  Continue Protonix.    Hypothyroidism - Continue to follow with PCP.   DM2 -  Continue to follow with PCP.   Disposition: Follow up in 4-6 months with Chrystie Nose, MD or APP.  Signed, Alver Sorrow, NP 08/09/2022, 3:31 PM [facility]

## 2022-08-10 ENCOUNTER — Encounter (HOSPITAL_BASED_OUTPATIENT_CLINIC_OR_DEPARTMENT_OTHER): Payer: Self-pay | Admitting: Family

## 2022-08-10 LAB — LDL CHOLESTEROL, DIRECT: LDL Direct: 159 mg/dL — ABNORMAL HIGH (ref 0–99)

## 2022-08-14 ENCOUNTER — Ambulatory Visit (INDEPENDENT_AMBULATORY_CARE_PROVIDER_SITE_OTHER): Payer: Medicare Other | Admitting: Family Medicine

## 2022-08-14 DIAGNOSIS — Z0000 Encounter for general adult medical examination without abnormal findings: Secondary | ICD-10-CM | POA: Diagnosis not present

## 2022-08-14 NOTE — Progress Notes (Signed)
MEDICARE ANNUAL WELLNESS VISIT  08/14/2022  Telephone Visit Disclaimer This Medicare AWV was conducted by telephone due to national recommendations for restrictions regarding the COVID-19 Pandemic (e.g. social distancing).  I verified, using two identifiers, that I am speaking with [name] or their authorized healthcare agent. I discussed the limitations, risks, security, and privacy concerns of performing an evaluation and management service by telephone and the potential availability of an in-person appointment in the future. The patient expressed understanding and agreed to proceed.  Location of Patient: home Location of Provider (nurse):  In the office  Subjective:    [name] is a 72 y.o. male patient of Everrett Coombe, DO who had a Medicare Annual Wellness Visit today via telephone. [name] is Retired and lives with their spouse. he has 4 children. he reports that he is socially active and does interact with friends/family regularly. he is minimally physically active and enjoys playing sodoku, playing cards and reading.  Patient Care Team: Everrett Coombe, DO as PCP - General (Family Medicine) Rennis Golden Lisette Abu, MD as PCP - Cardiology (Cardiology) Everrett Coombe, DO (Family Medicine)     08/14/2022   11:16 AM 03/19/2022    3:30 PM 03/16/2022    6:33 AM 03/12/2022    6:00 PM 03/12/2022    1:07 AM 07/26/2021   11:21 AM 07/21/2021    5:21 PM  Advanced Directives  Does Patient Have a Medical Advance Directive? No No No No No  No  Would patient like information on creating a medical advance directive? No - Patient declined  No - Patient declined No - Patient declined  No - Patient declined     Hospital Utilization Over the Past 12 Months: # of hospitalizations or ER visits: 3 # of surgeries: 0  Review of Systems    Patient reports that his overall health is unchanged compared to last year.  History obtained from chart review and the patient  Patient Reported Readings  (BP, Pulse, CBG, Weight, etc) none  Pain Assessment Pain : 0-10 Pain Score: 5  Pain Type: Chronic pain Pain Location: Leg Pain Orientation: Left Pain Descriptors / Indicators: Aching Pain Onset: More than a month ago Pain Frequency: Intermittent Pain Relieving Factors: tylenol  Pain Relieving Factors: tylenol  Current Medications & Allergies (verified) Allergies as of 08/14/2022       Reactions   Benazepril Hives, Cough   Gabapentin Swelling   Hydrocodone Hives, Itching   Shellfish Allergy Diarrhea, Nausea And Vomiting        Medication List        Accurate as of August 14, 2022 11:24 AM. If you have any questions, ask your nurse or doctor.          acarbose 100 MG tablet Commonly known as: PRECOSE Take 100 mg by mouth 3 (three) times daily with meals.   acetaminophen 500 MG tablet Commonly known as: TYLENOL Take 500-1,000 mg by mouth See admin instructions. Take 1000 mg by mouth in the morning and 500 mg at bedtime   AMBULATORY NON FORMULARY MEDICATION Please provide 3 in 1 bedside commode.  Dx:R29.6, M62.81   apixaban 5 MG Tabs tablet Commonly known as: ELIQUIS Take 1 tablet (5 mg total) by mouth 2 (two) times daily.   baclofen 20 MG tablet Commonly known as: LIORESAL TAKE 1 TABLET BY MOUTH TWICE A DAY   calcium carbonate 600 MG Tabs tablet Commonly known as: OS-CAL Take 600 mg by mouth daily.   carvedilol 6.25 MG  tablet Commonly known as: COREG Take 1 tablet (6.25 mg total) by mouth 2 (two) times daily.   folic acid 1 MG tablet Commonly known as: FOLVITE Take 2 mg by mouth daily.   insulin glargine 100 UNIT/ML injection Commonly known as: LANTUS Inject 0.24 mLs (24 Units total) into the skin daily.   ipratropium-albuterol 0.5-2.5 (3) MG/3ML Soln Commonly known as: DUONEB Use 1 vial (3mL) by nebulization 4 (four) times daily. What changed:  when to take this reasons to take this   Levothyroxine Sodium 75 MCG Caps Take 75 mcg by mouth  daily.   loratadine 10 MG tablet Commonly known as: CLARITIN Take 10 mg by mouth daily.   methotrexate 5 MG tablet Commonly known as: RHEUMATREX Take 3 tablets by mouth once a week. Caution: Chemotherapy. Protect from light.   pantoprazole 40 MG tablet Commonly known as: PROTONIX Take 1 tablet (40 mg total) by mouth as needed. What changed: when to take this   polyvinyl alcohol 1.4 % ophthalmic solution Commonly known as: LIQUIFILM TEARS Place 1 drop into both eyes in the morning and at bedtime.   predniSONE 10 MG tablet Commonly known as: DELTASONE Take 10 mg by mouth daily with breakfast. Take with 5 mg tablet to equal 15 mg   predniSONE 5 MG tablet Commonly known as: DELTASONE Take 5 mg by mouth daily with breakfast. Take with 10 mg to make a total of 15 mg   PRESCRIPTION MEDICATION Place 1 drop into both eyes in the morning and at bedtime. Th[facility] Eye Medicine -- Unknown Name -- Unknown Dosing   sertraline 25 MG tablet Commonly known as: ZOLOFT Take  (one tablet) daily for one week then increase to  daily. What changed:  how much to take how to take this when to take this additional instructions   torsemide 20 MG tablet Commonly known as: DEMADEX Take 1 tablet (20 mg total) by mouth daily. May take additional tablet as directed by lymphedema clinic or cardiologist.   TUMS PO Take 3 tablets by mouth 2 (two) times daily as needed (heartburn).        History (reviewed): Past Medical History:  Diagnosis Date   Autoimmune disease    Bronchitis    Chronic hiccups    Diabetes mellitus without complication    DM2 (diabetes mellitus, type 2) 09/28/2014   GERD (gastroesophageal reflux disease)    Hypogonadism in male 04/01/2018   Hypothyroidism    Joint pain    Necrotizing myopathy    Posttraumatic stress disorder 01/30/2022   PTSD (post-traumatic stress disorder)    Rheumatoid arthritis 08/09/2021   RSV (respiratory syncytial virus pneumonia)  03/11/2022   Past Surgical History:  Procedure Laterality Date   BIOPSY SHOULDER     Left   IR ANGIOGRAM PULMONARY BILATERAL SELECTIVE  07/22/2021   IR THROMBECT VENO MECH MOD SED  07/22/2021   IR US GUIDE VASC ACCESS LEFT  07/22/2021   RADIOLOGY WITH ANESTHESIA Right 07/22/2021   Procedure: IR WITH ANESTHESIA;  Surgeon: Radiologist, Medication, MD;  Location: [facility] OR;  Service: Radiology;  Laterality: Right;   UPPER LEG SOFT TISSUE BIOPSY Right    Family History  Problem Relation Age of Onset   Hypertension Mother    Diabetes Mother    Arthritis Mother    Depression Mother    Heart attack Father    Hypertension Father    Social History   Socioeconomic History   Marital status: Married    Spouse name: Selena Batten  Number of children: 4   Years of education: 16   Highest education level: Master's degree (e.g., MA, MS, MEng, MEd, MSW, MBA)  Occupational History   Occupation: Retired  Tobacco Use   Smoking status: Never   Smokeless tobacco: Never  Vaping Use   Vaping Use: Never used  Substance and Sexual Activity   Alcohol use: No   Drug use: No   Sexual activity: Not on file  Other Topics Concern   Not on file  Social History Narrative   Lives at home with his wife. He enjoys playing sodoku, playing cards and reading.   Social Determinants of Health   Financial Resource Strain: Low Risk  (08/14/2022)   Overall Financial Resource Strain (CARDIA)    Difficulty of Paying Living Expenses: Not hard at all  Food Insecurity: No Food Insecurity (08/14/2022)   Hunger Vital Sign    Worried About Running Out of Food in the Last Year: Never true    Ran Out of Food in the Last Year: Never true  Transportation Needs: No Transportation Needs (08/14/2022)   PRAPARE - Administrator, Civil Service (Medical): No    Lack of Transportation (Non-Medical): No  Physical Activity: Insufficiently Active (08/14/2022)   Exercise Vital Sign    Days of Exercise per Week: 3 days    Minutes of  Exercise per Session: 20 min  Stress: No Stress Concern Present (08/14/2022)   [employer] of Occupational Health - Occupational Stress Questionnaire    Feeling of Stress : Not at all  Social Connections: Socially Isolated (08/14/2022)   Social Connection and Isolation Panel [NHANES]    Frequency of Communication with Friends and Family: Once a week    Frequency of Social Gatherings with Friends and Family: Never    Attends Religious Services: Never    Database administrator or Organizations: No    Attends Banker Meetings: Never    Marital Status: Married    Activities of Daily Living    08/14/2022   11:16 AM 03/12/2022    6:00 PM  In your present state of health, do you have any difficulty performing the following activities:  Hearing? 1 0  Comment some hearing loss   Vision? 1 0  Comment glaucoma and cataract   Difficulty concentrating or making decisions? 1 0  Comment has some short term memory loss   Walking or climbing stairs? 1 1  Comment his wife assists   Dressing or bathing? 1 0  Comment his wife assists   Doing errands, shopping? 0 0  Preparing Food and eating ? Y   Comment his wife assists   Using the Toilet? N   In the past six months, have you accidently leaked urine? N   Do you have problems with loss of bowel control? N   Managing your Medications? Y   Comment his wife assists   Managing your Finances? Y   Comment his wife assists   Housekeeping or managing your Housekeeping? Y   Comment his wife assists     Patient Education/ Literacy How often do you need to have someone help you when you read instructions, pamphlets, or other written materials from your doctor or pharmacy?: 1 - Never What is the last grade level you completed in school?: Masters degree  Exercise Current Exercise Habits: Home exercise routine, Type of exercise: Other - see comments (stationary bike), Time (Minutes): 20, Frequency (Times/Week): 3, Weekly Exercise  (Minutes/Week): 60, Intensity: Moderate  Diet Patient reports consuming 3 meals a day and 1 snack(s) a day Patient reports that his primary diet is: Regular Patient reports that she does have regular access to food.   Depression Screen    08/14/2022   11:13 AM 08/09/2021   11:46 AM 09/21/2019   11:36 AM 10/01/2018    9:46 AM 09/30/2017    8:18 AM  PHQ 2/9 Scores  PHQ - 2 Score 0 0 0 0 0  PHQ- 9 Score   6       Fall Risk    08/14/2022   11:13 AM 06/19/2022    2:51 PM 08/09/2021   11:46 AM 12/12/2018    8:21 AM  Fall Risk   Falls in the past year? 1 1 1  0  Number falls in past yr: 1 1 1    Injury with Fall? 1 0 0   Risk for fall due to : History of fall(s);Impaired balance/gait Impaired balance/gait Impaired mobility   Follow up Falls evaluation completed;Education provided;Falls prevention discussed Falls evaluation completed Falls evaluation completed Falls evaluation completed     Objective:  [name] seemed alert and oriented and he participated appropriately during our telephone visit.  Blood Pressure Weight BMI  BP Readings from Last 3 Encounters:  08/09/22 109/70  06/19/22 106/64  06/05/22 114/60   Wt Readings from Last 3 Encounters:  08/09/22 222 lb 8 oz (100.9 kg)  06/19/22 212 lb (96.2 kg)  06/05/22 218 lb (98.9 kg)   BMI Readings from Last 1 Encounters:  08/09/22 33.83 kg/m    *Unable to obtain current vital signs, weight, and BMI due to telephone visit type  Hearing/Vision  [name] did not seem to have difficulty with hearing/understanding during the telephone conversation Reports that he has had a formal eye exam by an eye care professional within the past year Reports that he has not had a formal hearing evaluation within the past year *Unable to fully assess hearing and vision during telephone visit type  Cognitive Function:    08/14/2022   11:19 AM  6CIT Screen  What Year? 0 points  What month? 0 points  What time? 0 points  Count back from 20 0  points  Months in reverse 0 points  Repeat phrase 0 points  Total Score 0 points   (Normal:0-7, Significant for Dysfunction: >8)  Normal Cognitive Function Screening: Yes   Immunization & Health Maintenance Record Immunization History  Administered Date(s) Administered   Fluad Quad(high Dose 65+) 01/30/2019, 04/07/2020   Influenza, High Dose Seasonal PF 12/29/2016, 02/26/2017, 01/23/2022, 01/28/2022   Influenza,inj,Quad PF,6+ Mos 03/05/2016   Influenza-Unspecified 02/15/1998, 01/29/2000, 01/28/2001, 04/21/2001, 02/05/2002, 01/29/2003, 03/02/2004, 03/16/2005, 02/07/2006, 02/06/2007, 01/07/2008, 01/29/2008, 01/28/2009, 02/13/2010, 01/23/2011, 01/23/2012, 01/21/2013, 01/28/2014, 02/15/2014, 02/08/2015, 02/18/2017, 01/30/2019, 02/12/2019, 03/09/2021   Moderna Sars-Covid-2 Vaccination 06/12/2018, 07/11/2018, 06/13/2019, 07/11/2019, 05/07/2020   Pneumococcal Conjugate-13 10/15/2014, 01/18/2015   Pneumococcal Polysaccharide-23 10/07/2010, 11/22/2015   Pneumococcal-Unspecified 04/30/1997, 03/02/2004   Tdap 08/20/2011    Health Maintenance  Topic Date Due   DTaP/Tdap/Td (2 - Td or Tdap) 08/19/2021   Diabetic kidney evaluation - Urine ACR  08/15/2022 (Originally 10/29/1968)   COVID-19 Vaccine (6 - 2023-24 season) 08/30/2022 (Originally 12/29/2021)   Zoster Vaccines- Shingrix (1 of 2) 11/13/2022 (Originally 10/29/1969)   COLONOSCOPY (Pts 45-46yrs Insurance coverage will need to be confirmed)  08/14/2023 (Originally 10/28/1993)   HEMOGLOBIN A1C  09/09/2022   FOOT EXAM  11/15/2022   INFLUENZA VACCINE  11/29/2022   OPHTHALMOLOGY EXAM  06/01/2023   Diabetic kidney  evaluation - eGFR measurement  06/20/2023   Medicare Annual Wellness (AWV)  08/14/2023   Pneumonia Vaccine 60+ Years old  Completed   Hepatitis C Screening  Completed   HPV VACCINES  Aged Out       Assessment  This is a routine wellness examination for [name].  Health Maintenance: Due or Overdue Health Maintenance Due  Topic  Date Due   DTaP/Tdap/Td (2 - Td or Tdap) 08/19/2021    [name] does not need a referral for Community Assistance: Care Management:   no Social Work:    no Prescription Assistance:  no Nutrition/Diabetes Education:  no   Plan:  Personalized Goals  Goals Addressed               This Visit's Progress     Patient Stated (pt-stated)        Patient stated that he would like to be able to walk without falling.       Personalized Health Maintenance & Screening Recommendations  Urine ACR Shingles vaccine Colonoscopy Tetanus vaccine Eye exam  Lung Cancer Screening Recommended: no (Low Dose CT Chest recommended if Age 38-80 years, 30 pack-year currently smoking OR have quit w/in past 15 years) Hepatitis C Screening recommended: no HIV Screening recommended: no  Advanced Directives: Written information was not prepared per patient's request.  Referrals & Orders No orders of the defined types were placed in this encounter.   Follow-up Plan Follow-up with Everrett Coombe, DO as planned Please have the records from the [facility] faxed.  Medicare wellness visit in one year. Patient will access AVS my chart.   I have personally reviewed and noted the following in the patient's chart:   Medical and social history Use of alcohol, tobacco or illicit drugs  Current medications and supplements Functional ability and status Nutritional status Physical activity Advanced directives List of other physicians Hospitalizations, surgeries, and ER visits in previous 12 months Vitals Screenings to include cognitive, depression, and falls Referrals and appointments  In addition, I have reviewed and discussed with [name] certain preventive protocols, quality metrics, and best practice recommendations. A written personalized care plan for preventive services as well as general preventive health recommendations is available and can be mailed to the patient at his request.       Modesto Charon, RN BSN  08/14/2022

## 2022-08-14 NOTE — Patient Instructions (Addendum)
MEDICARE ANNUAL WELLNESS VISIT Health Maintenance Summary and Written Plan of Care  [name] ,  Thank you for allowing me to perform your Medicare Annual Wellness Visit and for your ongoing commitment to your health.   Health Maintenance & Immunization History Health Maintenance  Topic Date Due   DTaP/Tdap/Td (2 - Td or Tdap) 08/19/2021   Diabetic kidney evaluation - Urine ACR  08/15/2022 (Originally 10/29/1968)   COVID-19 Vaccine (6 - 2023-24 season) 08/30/2022 (Originally 12/29/2021)   Zoster Vaccines- Shingrix (1 of 2) 11/13/2022 (Originally 10/29/1969)   COLONOSCOPY (Pts 45-50yrs Insurance coverage will need to be confirmed)  08/14/2023 (Originally [birth year]28/1995)   HEMOGLOBIN A1C  09/09/2022   FOOT EXAM  11/15/2022   INFLUENZA VACCINE  11/29/2022   OPHTHALMOLOGY EXAM  06/01/2023   Diabetic kidney evaluation - eGFR measurement  06/20/2023   Medicare Annual Wellness (AWV)  08/14/2023   Pneumonia Vaccine 91+ Years old  Completed   Hepatitis C Screening  Completed   HPV VACCINES  Aged Out   Immunization History  Administered Date(s) Administered   Fluad Quad(high Dose 65+) 01/30/2019, 04/07/2020   Influenza, High Dose Seasonal PF 12/29/2016, 02/26/2017, 01/23/2022, 01/28/2022   Influenza,inj,Quad PF,6+ Mos 03/05/2016   Influenza-Unspecified 02/15/1998, 01/29/2000, 01/28/2001, 04/21/2001, 02/05/2002, 01/29/2003, 03/02/2004, 03/16/2005, 02/07/2006, 02/06/2007, 01/07/2008, 01/29/2008, 01/28/2009, 02/13/2010, 01/23/2011, 01/23/2012, 01/21/2013, 01/28/2014, 02/15/2014, 02/08/2015, 02/18/2017, 01/30/2019, 02/12/2019, 03/09/2021   Moderna Sars-Covid-2 Vaccination 06/12/2018, 07/11/2018, 06/13/2019, 07/11/2019, 05/07/2020   Pneumococcal Conjugate-13 10/15/2014, 01/18/2015   Pneumococcal Polysaccharide-23 10/07/2010, 11/22/2015   Pneumococcal-Unspecified 04/30/1997, 03/02/2004   Tdap 08/20/2011    These are the patient goals that we discussed:  Goals Addressed               This Visit's  Progress     Patient Stated (pt-stated)        Patient stated that he would like to be able to walk without falling.         This is a list of Health Maintenance Items that are overdue or due now: Health Maintenance Due  Topic Date Due   DTaP/Tdap/Td (2 - Td or Tdap) 08/19/2021   Urine ACR Shingles vaccine Colonoscopy Tetanus vaccine Eye exam  Orders/Referrals Placed Today: No orders of the defined types were placed in this encounter.  (Contact our referral department at [phone] if you have not spoken with someone about your referral appointment within the next 5 days)    Follow-up Plan Follow-up with Everrett Coombe, DO as planned Please have the records from the [facility] faxed.  Medicare wellness visit in one year. Patient will access AVS my chart.      Health Maintenance, Male Adopting a healthy lifestyle and getting preventive care are important in promoting health and wellness. Ask your health care provider about: The right schedule for you to have regular tests and exams. Things you can do on your own to prevent diseases and keep yourself healthy. What should I know about diet, weight, and exercise? Eat a healthy diet  Eat a diet that includes plenty of vegetables, fruits, low-fat dairy products, and lean protein. Do not eat a lot of foods that are high in solid fats, added sugars, or sodium. Maintain a healthy weight Body mass index (BMI) is a measurement that can be used to identify possible weight problems. It estimates body fat based on height and weight. Your health care provider can help determine your BMI and help you achieve or maintain a healthy weight. Get regular exercise Get regular exercise. This is one  of the most important things you can do for your health. Most adults should: Exercise for at least 150 minutes each week. The exercise should increase your heart rate and make you sweat (moderate-intensity exercise). Do strengthening exercises at least  twice a week. This is in addition to the moderate-intensity exercise. Spend less time sitting. Even light physical activity can be beneficial. Watch cholesterol and blood lipids Have your blood tested for lipids and cholesterol at 74 years of age, then have this test every 5 years. You may need to have your cholesterol levels checked more often if: Your lipid or cholesterol levels are high. You are older than 74 years of age. You are at high risk for heart disease. What should I know about cancer screening? Many types of cancers can be detected early and may often be prevented. Depending on your health history and family history, you may need to have cancer screening at various ages. This may include screening for: Colorectal cancer. Prostate cancer. Skin cancer. Lung cancer. What should I know about heart disease, diabetes, and high blood pressure? Blood pressure and heart disease High blood pressure causes heart disease and increases the risk of stroke. This is more likely to develop in people who have high blood pressure readings or are overweight. Talk with your health care provider about your target blood pressure readings. Have your blood pressure checked: Every 3-5 years if you are 57-82 years of age. Every year if you are 18 years old or older. If you are between the ages of 22 and 62 and are a current or former smoker, ask your health care provider if you should have a one-time screening for abdominal aortic aneurysm (AAA). Diabetes Have regular diabetes screenings. This checks your fasting blood sugar level. Have the screening done: Once every three years after age 16 if you are at a normal weight and have a low risk for diabetes. More often and at a younger age if you are overweight or have a high risk for diabetes. What should I know about preventing infection? Hepatitis B If you have a higher risk for hepatitis B, you should be screened for this virus. Talk with your health  care provider to find out if you are at risk for hepatitis B infection. Hepatitis C Blood testing is recommended for: Everyone born from [birth year] through 1967. Anyone with known risk factors for hepatitis C. Sexually transmitted infections (STIs) You should be screened each year for STIs, including gonorrhea and chlamydia, if: You are sexually active and are younger than 74 years of age. You are older than 74 years of age and your health care provider tells you that you are at risk for this type of infection. Your sexual activity has changed since you were last screened, and you are at increased risk for chlamydia or gonorrhea. Ask your health care provider if you are at risk. Ask your health care provider about whether you are at high risk for HIV. Your health care provider may recommend a prescription medicine to help prevent HIV infection. If you choose to take medicine to prevent HIV, you should first get tested for HIV. You should then be tested every 3 months for as long as you are taking the medicine. Follow these instructions at home: Alcohol use Do not drink alcohol if your health care provider tells you not to drink. If you drink alcohol: Limit how much you have to 0-2 drinks a day. Know how much alcohol is in your drink. In  the [place] one drink equals one 12 oz bottle of beer (355 mL), one 5 oz glass of wine (148 mL), or one 1 oz glass of hard liquor (44 mL). Lifestyle Do not use any products that contain nicotine or tobacco. These products include cigarettes, chewing tobacco, and vaping devices, such as e-cigarettes. If you need help quitting, ask your health care provider. Do not use street drugs. Do not share needles. Ask your health care provider for help if you need support or information about quitting drugs. General instructions Schedule regular health, dental, and eye exams. Stay current with your vaccines. Tell your health care provider if: You often feel depressed. You  have ever been abused or do not feel safe at home. Summary Adopting a healthy lifestyle and getting preventive care are important in promoting health and wellness. Follow your health care provider's instructions about healthy diet, exercising, and getting tested or screened for diseases. Follow your health care provider's instructions on monitoring your cholesterol and blood pressure. This information is not intended to replace advice given to you by your health care provider. Make sure you discuss any questions you have with your health care provider. Document Revised: 09/05/2020 Document Reviewed: 09/05/2020 Elsevier Patient Education  2023 [employer].

## 2022-08-26 ENCOUNTER — Encounter (HOSPITAL_BASED_OUTPATIENT_CLINIC_OR_DEPARTMENT_OTHER): Payer: Self-pay | Admitting: Emergency Medicine

## 2022-08-26 ENCOUNTER — Emergency Department (HOSPITAL_BASED_OUTPATIENT_CLINIC_OR_DEPARTMENT_OTHER)
Admission: EM | Admit: 2022-08-26 | Discharge: 2022-08-27 | Disposition: A | Discharge status: Home / Self Care | Payer: Medicare Other | Attending: Emergency Medicine | Admitting: Emergency Medicine

## 2022-08-26 ENCOUNTER — Emergency Department (HOSPITAL_BASED_OUTPATIENT_CLINIC_OR_DEPARTMENT_OTHER): Payer: Medicare Other

## 2022-08-26 ENCOUNTER — Other Ambulatory Visit: Payer: Self-pay

## 2022-08-26 DIAGNOSIS — E860 Dehydration: Secondary | ICD-10-CM | POA: Insufficient documentation

## 2022-08-26 DIAGNOSIS — R251 Tremor, unspecified: Secondary | ICD-10-CM | POA: Insufficient documentation

## 2022-08-26 DIAGNOSIS — E119 Type 2 diabetes mellitus without complications: Secondary | ICD-10-CM | POA: Insufficient documentation

## 2022-08-26 DIAGNOSIS — R410 Disorientation, unspecified: Secondary | ICD-10-CM | POA: Insufficient documentation

## 2022-08-26 DIAGNOSIS — R519 Headache, unspecified: Secondary | ICD-10-CM | POA: Insufficient documentation

## 2022-08-26 DIAGNOSIS — I509 Heart failure, unspecified: Secondary | ICD-10-CM | POA: Insufficient documentation

## 2022-08-26 DIAGNOSIS — R0602 Shortness of breath: Secondary | ICD-10-CM | POA: Diagnosis not present

## 2022-08-26 DIAGNOSIS — R531 Weakness: Secondary | ICD-10-CM

## 2022-08-26 DIAGNOSIS — I110 Hypertensive heart disease with heart failure: Secondary | ICD-10-CM | POA: Diagnosis not present

## 2022-08-26 DIAGNOSIS — R29898 Other symptoms and signs involving the musculoskeletal system: Secondary | ICD-10-CM | POA: Diagnosis not present

## 2022-08-26 DIAGNOSIS — S199XXA Unspecified injury of neck, initial encounter: Secondary | ICD-10-CM | POA: Diagnosis not present

## 2022-08-26 HISTORY — DX: Acute embolism and thrombosis of unspecified deep veins of unspecified lower extremity: I82.409

## 2022-08-26 HISTORY — DX: Other pulmonary embolism without acute cor pulmonale: I26.99

## 2022-08-26 HISTORY — DX: Non-ST elevation (NSTEMI) myocardial infarction: I21.4

## 2022-08-26 LAB — BASIC METABOLIC PANEL
Anion gap: 12 (ref 5–15)
BUN: 34 mg/dL — ABNORMAL HIGH (ref 8–23)
CO2: 26 mmol/L (ref 22–32)
Calcium: 9.1 mg/dL (ref 8.9–10.3)
Chloride: 100 mmol/L (ref 98–111)
Creatinine, Ser: 1.51 mg/dL — ABNORMAL HIGH (ref 0.61–1.24)
GFR, Estimated: 48 mL/min — ABNORMAL LOW (ref >60)
Glucose, Bld: 322 mg/dL — ABNORMAL HIGH (ref 70–99)
Potassium: 3.8 mmol/L (ref 3.5–5.1)
Sodium: 138 mmol/L (ref 135–145)

## 2022-08-26 LAB — CBC WITH DIFFERENTIAL/PLATELET
Abs Immature Granulocytes: 0.04 10*3/uL (ref 0.00–0.07)
Basophils Absolute: 0 10*3/uL (ref 0.0–0.1)
Basophils Relative: 0 %
Eosinophils Absolute: 0 10*3/uL (ref 0.0–0.5)
Eosinophils Relative: 1 %
HCT: 27 % — ABNORMAL LOW (ref 39.0–52.0)
Hemoglobin: 8.8 g/dL — ABNORMAL LOW (ref 13.0–17.0)
Immature Granulocytes: 1 %
Lymphocytes Relative: 15 %
Lymphs Abs: 0.9 10*3/uL (ref 0.7–4.0)
MCH: 31.9 pg (ref 26.0–34.0)
MCHC: 32.6 g/dL (ref 30.0–36.0)
MCV: 97.8 fL (ref 80.0–100.0)
Monocytes Absolute: 0.3 10*3/uL (ref 0.1–1.0)
Monocytes Relative: 6 %
Neutro Abs: 4.6 10*3/uL (ref 1.7–7.7)
Neutrophils Relative %: 77 %
Platelets: 174 10*3/uL (ref 150–400)
RBC: 2.76 MIL/uL — ABNORMAL LOW (ref 4.22–5.81)
RDW: 14.8 % (ref 11.5–15.5)
WBC: 5.9 10*3/uL (ref 4.0–10.5)
nRBC: 0 % (ref 0.0–0.2)

## 2022-08-26 LAB — TROPONIN I (HIGH SENSITIVITY): Troponin I (High Sensitivity): 16 ng/L (ref <18)

## 2022-08-26 MED ORDER — LACTATED RINGERS IV BOLUS
1000.0000 mL | Freq: Once | INTRAVENOUS | Status: AC
  Administered 2022-08-26: 1000 mL via INTRAVENOUS

## 2022-08-26 NOTE — ED Provider Notes (Signed)
[facility] Provider Note   CSN: [id number] Arrival date & time: 08/26/22  2129     History Chief Complaint  Patient presents with   Weakness    HPI [name] is a 72 y.o. male presenting for multiple complaints.  He is a 72 year old male with a very extensive medical history including diabetes high blood pressure, CVA, heart failure on torsemide. Wife provides the history.  She states that the torsemide dose was increased recently and he has had a GI illness this week and as a result he has been feeling dehydrated with malaise fatigue confusion shaking episodes.  They also endorse some headache today and multiple falls. Poor p.o. intake throughout the day as part of the GI illness.  Patient's recorded medical, surgical, social, medication list and allergies were reviewed in the Snapshot window as part of the initial history.   Review of Systems   Review of Systems  Constitutional:  Negative for chills and fever.  HENT:  Negative for ear pain and sore throat.   Eyes:  Negative for pain and visual disturbance.  Respiratory:  Negative for cough and shortness of breath.   Cardiovascular:  Negative for chest pain and palpitations.  Gastrointestinal:  Negative for abdominal pain and vomiting.  Genitourinary:  Negative for dysuria and hematuria.  Musculoskeletal:  Negative for arthralgias and back pain.  Skin:  Negative for color change and rash.  Neurological:  Negative for seizures and syncope.  Psychiatric/Behavioral:  Positive for confusion.   All other systems reviewed and are negative.   Physical Exam Updated Vital Signs BP 130/83 (BP Location: Right Arm)   Pulse 86   Temp 97.8 F (36.6 C)   Resp 18   Ht 5\' 8"  (1.727 m)   Wt 94.3 kg   SpO2 98%   BMI 31.63 kg/m  Physical Exam Vitals and nursing note reviewed.  Constitutional:      General: He is not in acute distress.    Appearance: He is well-developed.  HENT:      Head: Normocephalic and atraumatic.  Eyes:     Conjunctiva/sclera: Conjunctivae normal.  Cardiovascular:     Rate and Rhythm: Normal rate and regular rhythm.     Heart sounds: No murmur heard. Pulmonary:     Effort: Pulmonary effort is normal. No respiratory distress.     Breath sounds: Normal breath sounds.  Abdominal:     Palpations: Abdomen is soft.     Tenderness: There is no abdominal tenderness.  Musculoskeletal:        General: No swelling.     Cervical back: Neck supple.  Skin:    General: Skin is warm and dry.     Capillary Refill: Capillary refill takes less than 2 seconds.  Neurological:     Mental Status: He is alert.  Psychiatric:        Mood and Affect: Mood normal.      ED Course/ Medical Decision Making/ A&P    Procedures Procedures   Medications Ordered in ED Medications  lactated ringers bolus 1,000 mL (1,000 mLs Intravenous New Bag/Given 08/26/22 2226)    Medical Decision Making:    [name] is a 72 y.o. male who presented to the ED today with multiple complaints detailed above.     Additional history discussed with patient's family/caregivers.  Patient's presentation is complicated by their history of multiple comorbid medical problems.  Patient placed on continuous vitals and telemetry monitoring while in ED  which was reviewed periodically.   Complete initial physical exam performed, notably the patient  was hemodynamically stable in no acute distress.      Reviewed and confirmed nursing documentation for past medical history, family history, social history.    Initial Assessment:   With the patient's presentation of confusion episodes, shaking episodes, GI illness, recent increase in diuretic, most likely diagnosis is metabolic abnormality including dehydration. Other diagnoses were considered including (but not limited to) intracranial hemorrhage, intracranial mass, cervical neck injury given recent falls, infection including UTI or pulmonary  infection. These are considered less likely due to history of present illness and physical exam findings.   This is most consistent with an acute life/limb threatening illness complicated by underlying chronic conditions.  Initial Plan:  Screening labs including CBC and Metabolic panel to evaluate for infectious or metabolic etiology of disease.  Urinalysis with reflex culture ordered to evaluate for UTI or relevant urologic/nephrologic pathology.  CXR to evaluate for structural/infectious intrathoracic pathology.  CT head/CT C-spine to evaluate for traumatic intracranial or cervical injuries Objective evaluation as below reviewed with plan for close reassessment  Initial Study Results:   Laboratory  All laboratory results reviewed without evidence of clinically relevant pathology.   Exceptions include: Likely prerenal AKI with elevation of BUN over creatinine.  Downtrending hemoglobin from 11-8 though baseline appears to be in the low nines  EKG EKG was reviewed independently. Rate, rhythm, axis, intervals all examined and without medically relevant abnormality. ST segments without concerns for elevations.    Radiology  All images reviewed independently. Agree with radiology report at this time.   CT HEAD WO CONTRAST ( )  Result Date: 08/26/2022 CLINICAL DATA:  Left-sided weakness for 2 days. Polytrauma blunt, head trauma. EXAM: CT HEAD WITHOUT CONTRAST CT CERVICAL SPINE WITHOUT CONTRAST TECHNIQUE: Multidetector CT imaging of the head and cervical spine was performed following the standard protocol without intravenous contrast. Multiplanar CT image reconstructions of the cervical spine were also generated. RADIATION DOSE REDUCTION: This exam was performed according to the departmental dose-optimization program which includes automated exposure control, adjustment of the mA and/or kV according to patient size and/or use of iterative reconstruction technique. COMPARISON:  03/19/2022. FINDINGS:  CT HEAD FINDINGS Brain: No acute intracranial hemorrhage, midline shift or mass effect. No extra-axial fluid collection. Diffuse atrophy is noted. Periventricular white matter hypodensities are present bilaterally. No hydrocephalus. Vascular: No hyperdense vessel or unexpected calcification. Skull: Normal. Negative for fracture or focal lesion. Sinuses/Orbits: There is partial opacification of the frontal sinus and ethmoid air cells on the right. No acute orbital abnormality. Other: None. CT CERVICAL SPINE FINDINGS Alignment: There is mild retrolisthesis at C3-C4 and C5-C6. Skull base and vertebrae: No acute fracture. There is stable bony deformity of the posterior arch at C1 on the left. Soft tissues and spinal canal: No prevertebral fluid or swelling. No visible canal hematoma. Disc levels: Multilevel intervertebral disc space narrowing, disc osteophyte complexes, and facet arthropathy resulting in mild-to-moderate spinal canal stenosis, most pronounced at C3-C4 and C5-C6. Upper chest: No acute abnormality. Other: None. IMPRESSION: 1. No acute intracranial process. 2. Atrophy with chronic microvascular ischemic changes. 3. Multilevel degenerative changes in the cervical spine without evidence of acute fracture. Electronically Signed   By: Thornell Sartorius M.D.   On: 08/26/2022 23:16   CT CERVICAL SPINE WO CONTRAST  Result Date: 08/26/2022 CLINICAL DATA:  Left-sided weakness for 2 days. Polytrauma blunt, head trauma. EXAM: CT HEAD WITHOUT CONTRAST CT CERVICAL SPINE WITHOUT CONTRAST TECHNIQUE: Multidetector  CT imaging of the head and cervical spine was performed following the standard protocol without intravenous contrast. Multiplanar CT image reconstructions of the cervical spine were also generated. RADIATION DOSE REDUCTION: This exam was performed according to the departmental dose-optimization program which includes automated exposure control, adjustment of the mA and/or kV according to patient size and/or use  of iterative reconstruction technique. COMPARISON:  03/19/2022. FINDINGS: CT HEAD FINDINGS Brain: No acute intracranial hemorrhage, midline shift or mass effect. No extra-axial fluid collection. Diffuse atrophy is noted. Periventricular white matter hypodensities are present bilaterally. No hydrocephalus. Vascular: No hyperdense vessel or unexpected calcification. Skull: Normal. Negative for fracture or focal lesion. Sinuses/Orbits: There is partial opacification of the frontal sinus and ethmoid air cells on the right. No acute orbital abnormality. Other: None. CT CERVICAL SPINE FINDINGS Alignment: There is mild retrolisthesis at C3-C4 and C5-C6. Skull base and vertebrae: No acute fracture. There is stable bony deformity of the posterior arch at C1 on the left. Soft tissues and spinal canal: No prevertebral fluid or swelling. No visible canal hematoma. Disc levels: Multilevel intervertebral disc space narrowing, disc osteophyte complexes, and facet arthropathy resulting in mild-to-moderate spinal canal stenosis, most pronounced at C3-C4 and C5-C6. Upper chest: No acute abnormality. Other: None. IMPRESSION: 1. No acute intracranial process. 2. Atrophy with chronic microvascular ischemic changes. 3. Multilevel degenerative changes in the cervical spine without evidence of acute fracture. Electronically Signed   By: Thornell Sartorius M.D.   On: 08/26/2022 23:16   DG Chest Portable 1 View  Result Date: 08/26/2022 CLINICAL DATA:  Left-sided body stiffness for 2 days. Shortness of breath. EXAM: PORTABLE CHEST 1 VIEW COMPARISON:  04/16/2022. FINDINGS: The heart is enlarged and the mediastinal contour is within normal limits. Strandy atelectasis or infiltrate is present at the left lung base. No effusion or pneumothorax. No acute osseous abnormality. IMPRESSION: Mild atelectasis or infiltrate at the left lung base. Electronically Signed   By: Thornell Sartorius M.D.   On: 08/26/2022 23:02    Final Assessment and Plan:    Patient given 500 cc of fluid total and grossly has had symptomatic improvement he feels comfortable and is urinating easily. Given improvement of patient's syndrome, reassuring objective evaluation, I do believe that dehydration is likely related given multiple medication changes recently and GI illness.  After replacement of GI losses, patient feels comfortable outpatient care and management.  I recommended they call their PCP in the morning to schedule close outpatient care and management.   Disposition:  I have considered need for hospitalization, however, considering all of the above, I believe this patient is stable for discharge at this time.  Patient/family educated about specific return precautions for given chief complaint and symptoms.  Patient/family educated about follow-up with PCP.     Patient/family expressed understanding of return precautions and need for follow-up. Patient spoken to regarding all imaging and laboratory results and appropriate follow up for these results. All education provided in verbal form with additional information in written form. Time was allowed for answering of patient questions. Patient discharged.    Emergency Department Medication Summary:   Medications  lactated ringers bolus 1,000 mL (1,000 mLs Intravenous New Bag/Given 08/26/22 2226)         Clinical Impression:  1. Weakness      Discharge   Final Clinical Impression(s) / ED Diagnoses Final diagnoses:  Weakness    Rx / DC Orders ED Discharge Orders     None  Glyn Ade, MD 08/26/22 2329

## 2022-08-26 NOTE — ED Notes (Signed)
Attempted x 1 to start IV/obtain labs 

## 2022-08-26 NOTE — ED Triage Notes (Signed)
Pt w/ LT side weakness x about 2 days; wife reports she noticed his body stiffen up tonight and this concerned her; pt does not remember this; wife sts he did the same thing before when he had a PE

## 2022-08-27 LAB — CBG MONITORING, ED: Glucose-Capillary: 241 mg/dL — ABNORMAL HIGH (ref 70–99)

## 2022-08-30 ENCOUNTER — Ambulatory Visit: Payer: Medicare Other | Admitting: Family Medicine

## 2022-08-30 DIAGNOSIS — R609 Edema, unspecified: Secondary | ICD-10-CM | POA: Diagnosis not present

## 2022-08-30 DIAGNOSIS — G7241 Inclusion body myositis [IBM]: Secondary | ICD-10-CM | POA: Diagnosis not present

## 2022-08-30 DIAGNOSIS — I890 Lymphedema, not elsewhere classified: Secondary | ICD-10-CM | POA: Diagnosis not present

## 2022-08-30 DIAGNOSIS — L89619 Pressure ulcer of right heel, unspecified stage: Secondary | ICD-10-CM | POA: Diagnosis not present

## 2022-09-05 ENCOUNTER — Ambulatory Visit (INDEPENDENT_AMBULATORY_CARE_PROVIDER_SITE_OTHER): Payer: Medicare Other | Admitting: Family Medicine

## 2022-09-05 ENCOUNTER — Encounter: Payer: Self-pay | Admitting: Family Medicine

## 2022-09-05 VITALS — BP 121/77 | HR 88 | Temp 98.2°F | Ht 68.0 in | Wt 207.9 lb

## 2022-09-05 DIAGNOSIS — R600 Localized edema: Secondary | ICD-10-CM | POA: Diagnosis not present

## 2022-09-05 DIAGNOSIS — L98419 Non-pressure chronic ulcer of buttock with unspecified severity: Secondary | ICD-10-CM

## 2022-09-05 DIAGNOSIS — L97819 Non-pressure chronic ulcer of other part of right lower leg with unspecified severity: Secondary | ICD-10-CM | POA: Diagnosis not present

## 2022-09-05 DIAGNOSIS — D649 Anemia, unspecified: Secondary | ICD-10-CM

## 2022-09-05 DIAGNOSIS — R531 Weakness: Secondary | ICD-10-CM | POA: Diagnosis not present

## 2022-09-05 DIAGNOSIS — I872 Venous insufficiency (chronic) (peripheral): Secondary | ICD-10-CM

## 2022-09-05 DIAGNOSIS — E291 Testicular hypofunction: Secondary | ICD-10-CM

## 2022-09-05 DIAGNOSIS — R197 Diarrhea, unspecified: Secondary | ICD-10-CM

## 2022-09-05 DIAGNOSIS — I5042 Chronic combined systolic (congestive) and diastolic (congestive) heart failure: Secondary | ICD-10-CM | POA: Diagnosis not present

## 2022-09-05 LAB — CBC WITH DIFFERENTIAL/PLATELET
Basophils Absolute: 31 cells/uL (ref 0–200)
Basophils Relative: 0.5 %
Eosinophils Absolute: 87 cells/uL (ref 15–500)
Hemoglobin: 9.6 g/dL — ABNORMAL LOW (ref 13.2–17.1)
MCHC: 32.9 g/dL (ref 32.0–36.0)
MCV: 95.7 fL (ref 80.0–100.0)
MPV: 10.3 fL (ref 7.5–12.5)
Neutro Abs: 4892 cells/uL (ref 1500–7800)

## 2022-09-05 MED ORDER — AMBULATORY NON FORMULARY MEDICATION: 0 refills | Status: AC

## 2022-09-05 MED ORDER — TESTOSTERONE CYPIONATE 200 MG/ML IM SOLN: 100.0000 mg | INTRAMUSCULAR | 1 refills | Status: DC

## 2022-09-05 NOTE — Progress Notes (Signed)
[name] - 72 y.o. male MRN [id number]  Date of birth: 08-01-1950  Subjective Chief Complaint  Patient presents with   Extremity Weakness    Pt is following up from Ed Visit on 08/26/2022. He states that symptoms have increased since having the heart attack a year ago. He complains of lower extremity weakness at least once daily.     HPI Trevino [name] is a 72 y.o. male here today for follow up from recent ED visit.  He had GI illness recently resulting in increased malaise, fatigue and dehydration.  Torsemide had recently been increased as well by cardiology.  Glucose elevated  in the ED.  Hemoglobin has decreased.  Denies bleeding including blood in the stool.  He is having some increased diarrhea.  He does continue to have some generalized weakness.  He has been seeing lymphedema clinic as well due to ongoing swelling.  He has been using compression stockings.  He is not using wraps.  He does have a small ulceration along the right side of the leg due to abrasion from the compression socks.  They have recommended thigh-high compression stockings due to his ongoing swelling.  ROS:  A comprehensive ROS was completed and negative except as noted per HPI  Allergies  Allergen Reactions   Benazepril Hives and Cough   Gabapentin Swelling   Hydrocodone Hives and Itching   Shellfish Allergy Diarrhea and Nausea And Vomiting    Past Medical History:  Diagnosis Date   Autoimmune disease (HCC)    Bronchitis    Chronic hiccups    Diabetes mellitus without complication (HCC)    DM2 (diabetes mellitus, type 2) (HCC) 09/28/2014   DVT (deep venous thrombosis) (HCC)    GERD (gastroesophageal reflux disease)    Hypogonadism in male 04/01/2018   Hypothyroidism    Joint pain    Necrotizing myopathy    NSTEMI (non-ST elevated myocardial infarction) (HCC)    Posttraumatic stress disorder 01/30/2022   PTSD (post-traumatic stress disorder)    Pulmonary embolism (HCC)    Rheumatoid arthritis (HCC)  08/09/2021   RSV (respiratory syncytial virus pneumonia) 03/11/2022    Past Surgical History:  Procedure Laterality Date   BIOPSY SHOULDER     Left   IR ANGIOGRAM PULMONARY BILATERAL SELECTIVE  07/22/2021   IR THROMBECT VENO MECH MOD SED  07/22/2021   IR US GUIDE VASC ACCESS LEFT  07/22/2021   RADIOLOGY WITH ANESTHESIA Right 07/22/2021   Procedure: IR WITH ANESTHESIA;  Surgeon: Radiologist, Medication, MD;  Location: [facility] OR;  Service: Radiology;  Laterality: Right;   UPPER LEG SOFT TISSUE BIOPSY Right     Social History   Socioeconomic History   Marital status: Married    Spouse name: Kim   Number of children: 4   Years of education: 16   Highest education level: Master's degree (e.g., MA, MS, MEng, MEd, MSW, MBA)  Occupational History   Occupation: Retired  Tobacco Use   Smoking status: Never   Smokeless tobacco: Never  Vaping Use   Vaping Use: Never used  Substance and Sexual Activity   Alcohol use: No   Drug use: No   Sexual activity: Not on file  Other Topics Concern   Not on file  Social History Narrative   Lives at home with his wife. He enjoys playing sodoku, playing cards and reading.   Social Determinants of Health   Financial Resource Strain: Low Risk  (08/14/2022)   Overall Financial Resource Strain (CARDIA)    Difficulty  of Paying Living Expenses: Not hard at all  Food Insecurity: No Food Insecurity (08/14/2022)   Hunger Vital Sign    Worried About Running Out of Food in the Last Year: Never true    Ran Out of Food in the Last Year: Never true  Transportation Needs: No Transportation Needs (08/14/2022)   PRAPARE - Administrator, Civil Service (Medical): No    Lack of Transportation (Non-Medical): No  Physical Activity: Insufficiently Active (08/14/2022)   Exercise Vital Sign    Days of Exercise per Week: 3 days    Minutes of Exercise per Session: 20 min  Stress: No Stress Concern Present (08/14/2022)   [employer] of Occupational Health -  Occupational Stress Questionnaire    Feeling of Stress : Not at all  Social Connections: Socially Isolated (08/14/2022)   Social Connection and Isolation Panel [NHANES]    Frequency of Communication with Friends and Family: Once a week    Frequency of Social Gatherings with Friends and Family: Never    Attends Religious Services: Never    Database administrator or Organizations: No    Attends Engineer, structural: Never    Marital Status: Married    Family History  Problem Relation Age of Onset   Hypertension Mother    Diabetes Mother    Arthritis Mother    Depression Mother    Heart attack Father    Hypertension Father     Health Maintenance  Topic Date Due   Diabetic kidney evaluation - Urine ACR  Never done   DTaP/Tdap/Td (2 - Td or Tdap) 08/19/2021   HEMOGLOBIN A1C  09/09/2022   COVID-19 Vaccine (6 - 2023-24 season) 09/21/2022 (Originally 12/29/2021)   Zoster Vaccines- Shingrix (1 of 2) 11/13/2022 (Originally 10/29/1967)   COLONOSCOPY (Pts 45-59yrs Insurance coverage will need to be confirmed)  08/14/2023 (Originally 10/28/1993)   FOOT EXAM  11/15/2022   INFLUENZA VACCINE  11/29/2022   OPHTHALMOLOGY EXAM  06/01/2023   Medicare Annual Wellness (AWV)  08/14/2023   Diabetic kidney evaluation - eGFR measurement  09/05/2023   Pneumonia Vaccine 27+ Years old  Completed   Hepatitis C Screening  Completed   HPV VACCINES  Aged Out     ----------------------------------------------------------------------------------------------------------------------------------------------------------------------------------------------------------------- Physical Exam BP 121/77   Pulse 88   Temp 98.2 F (36.8 C) (Oral)   Ht 5\' 8"  (1.727 m)   Wt 207 lb 14.3 oz (94.3 kg) Comment: Pt is in a wheelchair.  SpO2 100%   BMI 31.61 kg/m   Physical Exam Constitutional:      Appearance: Normal appearance.  HENT:     Head: Normocephalic and atraumatic.  Eyes:     General: No scleral  icterus. Cardiovascular:     Rate and Rhythm: Normal rate and regular rhythm.  Pulmonary:     Effort: Pulmonary effort is normal.     Breath sounds: Normal breath sounds.  Musculoskeletal:     Cervical back: Neck supple.  Skin:    Comments: Small popped blister/ulceration to the right lateral calf  Neurological:     Mental Status: He is alert.     Comments: Generalized weakness, most notable in lower extremities.  Psychiatric:        Mood and Affect: Mood normal.        Behavior: Behavior normal.     ------------------------------------------------------------------------------------------------------------------------------------------------------------------------------------------------------------------- Assessment and Plan  Chronic combined systolic and diastolic CHF (congestive heart failure) (HCC) He does continue to have lower extremity swelling.  Remains on  Lasix which was recently increased by his cardiologist.  Update renal function and electrolytes.  Hypogonadism in male He felt that the strength is better when taking testosterone.  Will add this back on.  Skin ulcer of buttock (HCC) Referral placed to wound care.  He does also have an ulceration on the lateral calf as well.  Diarrhea C. difficile toxin ordered.  Generalized weakness He continues to have generalized weakness.  He did have some increased anemia on recent labs.  Updating labs today.  Adding iron panel as well.  Referral placed to physical therapy.   Meds ordered this encounter  Medications   AMBULATORY NON FORMULARY MEDICATION    Sig: Please provide bilateral thigh high compression stockings: Graded compression of 20-97mmhg    Dispense:  3 Package    Refill:  0   testosterone cypionate (DEPOTESTOSTERONE CYPIONATE) 200 MG/ML injection    Sig: Inject 0.5 mLs (100 mg total) into the muscle every 7 (seven) days.    Dispense:  10 mL    Refill:  1    Return in about 6 weeks (around 10/17/2022) for  F/u testosterone/edema.    This visit occurred during the SARS-CoV-2 public health emergency.  Safety protocols were in place, including screening questions prior to the visit, additional usage of staff PPE, and extensive cleaning of exam room while observing appropriate contact time as indicated for disinfecting solutions.

## 2022-09-06 LAB — COMPLETE METABOLIC PANEL WITH GFR
AG Ratio: 2.3 (calc) (ref 1.0–2.5)
ALT: 18 U/L (ref 9–46)
AST: 17 U/L (ref 10–35)
Albumin: 4.2 g/dL (ref 3.6–5.1)
Alkaline phosphatase (APISO): 53 U/L (ref 35–144)
BUN/Creatinine Ratio: 13 (calc) (ref 6–22)
BUN: 17 mg/dL (ref 7–25)
CO2: 23 mmol/L (ref 20–32)
Calcium: 9.8 mg/dL (ref 8.6–10.3)
Chloride: 102 mmol/L (ref 98–110)
Creat: 1.35 mg/dL — ABNORMAL HIGH (ref 0.70–1.28)
Globulin: 1.8 g/dL (calc) — ABNORMAL LOW (ref 1.9–3.7)
Glucose, Bld: 230 mg/dL — ABNORMAL HIGH (ref 65–99)
Potassium: 4.5 mmol/L (ref 3.5–5.3)
Sodium: 140 mmol/L (ref 135–146)
Total Bilirubin: 0.4 mg/dL (ref 0.2–1.2)
Total Protein: 6 g/dL — ABNORMAL LOW (ref 6.1–8.1)
eGFR: 55 mL/min/{1.73_m2} — ABNORMAL LOW (ref >=60)

## 2022-09-06 LAB — CBC WITH DIFFERENTIAL/PLATELET
Absolute Monocytes: 298 cells/uL (ref 200–950)
Eosinophils Relative: 1.4 %
HCT: 29.2 % — ABNORMAL LOW (ref 38.5–50.0)
Lymphs Abs: 893 cells/uL (ref 850–3900)
MCH: 31.5 pg (ref 27.0–33.0)
Monocytes Relative: 4.8 %
Neutrophils Relative %: 78.9 %
Platelets: 221 10*3/uL (ref 140–400)
RBC: 3.05 10*6/uL — ABNORMAL LOW (ref 4.20–5.80)
RDW: 14.1 % (ref 11.0–15.0)
Total Lymphocyte: 14.4 %
WBC: 6.2 10*3/uL (ref 3.8–10.8)

## 2022-09-06 LAB — IRON,TIBC AND FERRITIN PANEL
%SAT: 31 % (calc) (ref 20–48)
Ferritin: 342 ng/mL (ref 24–380)
Iron: 72 ug/dL (ref 50–180)
TIBC: 235 mcg/dL (calc) — ABNORMAL LOW (ref 250–425)

## 2022-09-06 LAB — TSH: TSH: 1.18 mIU/L (ref 0.40–4.50)

## 2022-09-06 LAB — VITAMIN B12: Vitamin B-12: 690 pg/mL (ref 200–1100)

## 2022-09-07 DIAGNOSIS — G7241 Inclusion body myositis [IBM]: Secondary | ICD-10-CM | POA: Diagnosis not present

## 2022-09-07 DIAGNOSIS — I890 Lymphedema, not elsewhere classified: Secondary | ICD-10-CM | POA: Diagnosis not present

## 2022-09-07 DIAGNOSIS — R609 Edema, unspecified: Secondary | ICD-10-CM | POA: Diagnosis not present

## 2022-09-07 DIAGNOSIS — L89619 Pressure ulcer of right heel, unspecified stage: Secondary | ICD-10-CM | POA: Diagnosis not present

## 2022-09-09 DIAGNOSIS — R197 Diarrhea, unspecified: Secondary | ICD-10-CM | POA: Insufficient documentation

## 2022-09-09 DIAGNOSIS — L98419 Non-pressure chronic ulcer of buttock with unspecified severity: Secondary | ICD-10-CM | POA: Insufficient documentation

## 2022-09-09 DIAGNOSIS — I872 Venous insufficiency (chronic) (peripheral): Secondary | ICD-10-CM | POA: Insufficient documentation

## 2022-09-09 DIAGNOSIS — R531 Weakness: Secondary | ICD-10-CM | POA: Insufficient documentation

## 2022-09-09 DIAGNOSIS — D649 Anemia, unspecified: Secondary | ICD-10-CM | POA: Insufficient documentation

## 2022-09-09 NOTE — Assessment & Plan Note (Signed)
He felt that the strength is better when taking testosterone.  Will add this back on.

## 2022-09-09 NOTE — Assessment & Plan Note (Signed)
He continues to have generalized weakness.  He did have some increased anemia on recent labs.  Updating labs today.  Adding iron panel as well.  Referral placed to physical therapy.

## 2022-09-09 NOTE — Assessment & Plan Note (Signed)
He does continue to have lower extremity swelling.  Remains on Lasix which was recently increased by his cardiologist.  Update renal function and electrolytes.

## 2022-09-09 NOTE — Assessment & Plan Note (Signed)
C. difficile toxin ordered.

## 2022-09-09 NOTE — Assessment & Plan Note (Signed)
Referral placed to wound care.  He does also have an ulceration on the lateral calf as well.

## 2022-09-12 ENCOUNTER — Ambulatory Visit: Payer: Medicare Other | Admitting: Family Medicine

## 2022-09-12 ENCOUNTER — Emergency Department (HOSPITAL_BASED_OUTPATIENT_CLINIC_OR_DEPARTMENT_OTHER): Payer: Medicare Other

## 2022-09-12 ENCOUNTER — Other Ambulatory Visit: Payer: Self-pay

## 2022-09-12 ENCOUNTER — Encounter (HOSPITAL_BASED_OUTPATIENT_CLINIC_OR_DEPARTMENT_OTHER): Payer: Self-pay | Admitting: Emergency Medicine

## 2022-09-12 ENCOUNTER — Inpatient Hospital Stay (HOSPITAL_BASED_OUTPATIENT_CLINIC_OR_DEPARTMENT_OTHER)
Admission: EM | Admit: 2022-09-12 | Discharge: 2022-09-15 | DRG: 312 | Disposition: A | Discharge status: Home / Self Care | Payer: Medicare Other | Attending: Internal Medicine | Admitting: Internal Medicine

## 2022-09-12 ENCOUNTER — Inpatient Hospital Stay (HOSPITAL_COMMUNITY): Payer: Medicare Other

## 2022-09-12 ENCOUNTER — Telehealth: Payer: Self-pay

## 2022-09-12 DIAGNOSIS — E1122 Type 2 diabetes mellitus with diabetic chronic kidney disease: Secondary | ICD-10-CM | POA: Diagnosis present

## 2022-09-12 DIAGNOSIS — I252 Old myocardial infarction: Secondary | ICD-10-CM

## 2022-09-12 DIAGNOSIS — E669 Obesity, unspecified: Secondary | ICD-10-CM | POA: Diagnosis present

## 2022-09-12 DIAGNOSIS — I700 Atherosclerosis of aorta: Secondary | ICD-10-CM | POA: Diagnosis present

## 2022-09-12 DIAGNOSIS — M48061 Spinal stenosis, lumbar region without neurogenic claudication: Secondary | ICD-10-CM | POA: Diagnosis present

## 2022-09-12 DIAGNOSIS — Z6833 Body mass index (BMI) 33.0-33.9, adult: Secondary | ICD-10-CM

## 2022-09-12 DIAGNOSIS — R55 Syncope and collapse: Principal | ICD-10-CM | POA: Diagnosis present

## 2022-09-12 DIAGNOSIS — G40909 Epilepsy, unspecified, not intractable, without status epilepticus: Secondary | ICD-10-CM | POA: Diagnosis present

## 2022-09-12 DIAGNOSIS — Z818 Family history of other mental and behavioral disorders: Secondary | ICD-10-CM

## 2022-09-12 DIAGNOSIS — Z8249 Family history of ischemic heart disease and other diseases of the circulatory system: Secondary | ICD-10-CM

## 2022-09-12 DIAGNOSIS — Z8261 Family history of arthritis: Secondary | ICD-10-CM

## 2022-09-12 DIAGNOSIS — M4802 Spinal stenosis, cervical region: Secondary | ICD-10-CM | POA: Diagnosis present

## 2022-09-12 DIAGNOSIS — R4182 Altered mental status, unspecified: Secondary | ICD-10-CM | POA: Diagnosis not present

## 2022-09-12 DIAGNOSIS — M7989 Other specified soft tissue disorders: Secondary | ICD-10-CM | POA: Diagnosis present

## 2022-09-12 DIAGNOSIS — E1140 Type 2 diabetes mellitus with diabetic neuropathy, unspecified: Secondary | ICD-10-CM | POA: Diagnosis not present

## 2022-09-12 DIAGNOSIS — K219 Gastro-esophageal reflux disease without esophagitis: Secondary | ICD-10-CM | POA: Diagnosis present

## 2022-09-12 DIAGNOSIS — Z86718 Personal history of other venous thrombosis and embolism: Secondary | ICD-10-CM | POA: Diagnosis not present

## 2022-09-12 DIAGNOSIS — Z79899 Other long term (current) drug therapy: Secondary | ICD-10-CM | POA: Diagnosis not present

## 2022-09-12 DIAGNOSIS — G7249 Other inflammatory and immune myopathies, not elsewhere classified: Secondary | ICD-10-CM | POA: Diagnosis present

## 2022-09-12 DIAGNOSIS — R1110 Vomiting, unspecified: Secondary | ICD-10-CM | POA: Diagnosis present

## 2022-09-12 DIAGNOSIS — I130 Hypertensive heart and chronic kidney disease with heart failure and stage 1 through stage 4 chronic kidney disease, or unspecified chronic kidney disease: Secondary | ICD-10-CM | POA: Diagnosis present

## 2022-09-12 DIAGNOSIS — I890 Lymphedema, not elsewhere classified: Secondary | ICD-10-CM | POA: Diagnosis not present

## 2022-09-12 DIAGNOSIS — F32A Depression, unspecified: Secondary | ICD-10-CM

## 2022-09-12 DIAGNOSIS — M069 Rheumatoid arthritis, unspecified: Secondary | ICD-10-CM | POA: Diagnosis not present

## 2022-09-12 DIAGNOSIS — Z7901 Long term (current) use of anticoagulants: Secondary | ICD-10-CM

## 2022-09-12 DIAGNOSIS — G4733 Obstructive sleep apnea (adult) (pediatric): Secondary | ICD-10-CM | POA: Diagnosis present

## 2022-09-12 DIAGNOSIS — R1310 Dysphagia, unspecified: Secondary | ICD-10-CM | POA: Diagnosis present

## 2022-09-12 DIAGNOSIS — R066 Hiccough: Secondary | ICD-10-CM | POA: Diagnosis present

## 2022-09-12 DIAGNOSIS — D631 Anemia in chronic kidney disease: Secondary | ICD-10-CM | POA: Diagnosis present

## 2022-09-12 DIAGNOSIS — M40204 Unspecified kyphosis, thoracic region: Secondary | ICD-10-CM | POA: Diagnosis not present

## 2022-09-12 DIAGNOSIS — Z86711 Personal history of pulmonary embolism: Secondary | ICD-10-CM

## 2022-09-12 DIAGNOSIS — R404 Transient alteration of awareness: Secondary | ICD-10-CM | POA: Diagnosis present

## 2022-09-12 DIAGNOSIS — Z7952 Long term (current) use of systemic steroids: Secondary | ICD-10-CM

## 2022-09-12 DIAGNOSIS — M4316 Spondylolisthesis, lumbar region: Secondary | ICD-10-CM | POA: Diagnosis not present

## 2022-09-12 DIAGNOSIS — Z888 Allergy status to other drugs, medicaments and biological substances status: Secondary | ICD-10-CM

## 2022-09-12 DIAGNOSIS — G8194 Hemiplegia, unspecified affecting left nondominant side: Secondary | ICD-10-CM | POA: Diagnosis present

## 2022-09-12 DIAGNOSIS — I2510 Atherosclerotic heart disease of native coronary artery without angina pectoris: Secondary | ICD-10-CM | POA: Diagnosis not present

## 2022-09-12 DIAGNOSIS — G319 Degenerative disease of nervous system, unspecified: Secondary | ICD-10-CM | POA: Diagnosis not present

## 2022-09-12 DIAGNOSIS — Z794 Long term (current) use of insulin: Secondary | ICD-10-CM | POA: Diagnosis not present

## 2022-09-12 DIAGNOSIS — J9811 Atelectasis: Secondary | ICD-10-CM | POA: Diagnosis present

## 2022-09-12 DIAGNOSIS — M47812 Spondylosis without myelopathy or radiculopathy, cervical region: Secondary | ICD-10-CM | POA: Diagnosis not present

## 2022-09-12 DIAGNOSIS — R569 Unspecified convulsions: Secondary | ICD-10-CM | POA: Diagnosis not present

## 2022-09-12 DIAGNOSIS — I5043 Acute on chronic combined systolic (congestive) and diastolic (congestive) heart failure: Secondary | ICD-10-CM | POA: Diagnosis not present

## 2022-09-12 DIAGNOSIS — I5031 Acute diastolic (congestive) heart failure: Secondary | ICD-10-CM | POA: Diagnosis not present

## 2022-09-12 DIAGNOSIS — E039 Hypothyroidism, unspecified: Secondary | ICD-10-CM | POA: Diagnosis not present

## 2022-09-12 DIAGNOSIS — E1165 Type 2 diabetes mellitus with hyperglycemia: Secondary | ICD-10-CM | POA: Diagnosis present

## 2022-09-12 DIAGNOSIS — F4310 Post-traumatic stress disorder, unspecified: Secondary | ICD-10-CM | POA: Diagnosis present

## 2022-09-12 DIAGNOSIS — N1831 Chronic kidney disease, stage 3a: Secondary | ICD-10-CM | POA: Diagnosis not present

## 2022-09-12 DIAGNOSIS — Z91013 Allergy to seafood: Secondary | ICD-10-CM

## 2022-09-12 DIAGNOSIS — E1139 Type 2 diabetes mellitus with other diabetic ophthalmic complication: Secondary | ICD-10-CM

## 2022-09-12 DIAGNOSIS — R5381 Other malaise: Secondary | ICD-10-CM | POA: Diagnosis present

## 2022-09-12 DIAGNOSIS — Z79890 Hormone replacement therapy (postmenopausal): Secondary | ICD-10-CM

## 2022-09-12 DIAGNOSIS — Z833 Family history of diabetes mellitus: Secondary | ICD-10-CM

## 2022-09-12 DIAGNOSIS — M79605 Pain in left leg: Secondary | ICD-10-CM | POA: Diagnosis present

## 2022-09-12 DIAGNOSIS — Z885 Allergy status to narcotic agent status: Secondary | ICD-10-CM

## 2022-09-12 HISTORY — DX: Chronic systolic (congestive) heart failure: I50.22

## 2022-09-12 HISTORY — DX: Chronic kidney disease, stage 3a: N18.31

## 2022-09-12 HISTORY — DX: Atherosclerotic heart disease of native coronary artery without angina pectoris: I25.10

## 2022-09-12 LAB — CBC WITH DIFFERENTIAL/PLATELET
Abs Immature Granulocytes: 0.17 10*3/uL — ABNORMAL HIGH (ref 0.00–0.07)
Basophils Absolute: 0 10*3/uL (ref 0.0–0.1)
Basophils Relative: 0 %
Eosinophils Absolute: 0 10*3/uL (ref 0.0–0.5)
Eosinophils Relative: 1 %
HCT: 28.8 % — ABNORMAL LOW (ref 39.0–52.0)
Hemoglobin: 9.5 g/dL — ABNORMAL LOW (ref 13.0–17.0)
Immature Granulocytes: 3 %
Lymphocytes Relative: 14 %
Lymphs Abs: 0.9 10*3/uL (ref 0.7–4.0)
MCH: 31.9 pg (ref 26.0–34.0)
MCHC: 33 g/dL (ref 30.0–36.0)
MCV: 96.6 fL (ref 80.0–100.0)
Monocytes Absolute: 0.3 10*3/uL (ref 0.1–1.0)
Monocytes Relative: 4 %
Neutro Abs: 5 10*3/uL (ref 1.7–7.7)
Neutrophils Relative %: 78 %
Platelets: 242 10*3/uL (ref 150–400)
RBC: 2.98 MIL/uL — ABNORMAL LOW (ref 4.22–5.81)
RDW: 15.1 % (ref 11.5–15.5)
WBC: 6.4 10*3/uL (ref 4.0–10.5)
nRBC: 0.8 % — ABNORMAL HIGH (ref 0.0–0.2)

## 2022-09-12 LAB — COMPREHENSIVE METABOLIC PANEL
ALT: 21 U/L (ref 0–44)
AST: 24 U/L (ref 15–41)
Albumin: 3.9 g/dL (ref 3.5–5.0)
Alkaline Phosphatase: 49 U/L (ref 38–126)
Anion gap: 10 (ref 5–15)
BUN: 23 mg/dL (ref 8–23)
CO2: 25 mmol/L (ref 22–32)
Calcium: 9.2 mg/dL (ref 8.9–10.3)
Chloride: 100 mmol/L (ref 98–111)
Creatinine, Ser: 1.27 mg/dL — ABNORMAL HIGH (ref 0.61–1.24)
GFR, Estimated: 60 mL/min — ABNORMAL LOW (ref >60)
Glucose, Bld: 284 mg/dL — ABNORMAL HIGH (ref 70–99)
Potassium: 4.5 mmol/L (ref 3.5–5.1)
Sodium: 135 mmol/L (ref 135–145)
Total Bilirubin: 0.6 mg/dL (ref 0.3–1.2)
Total Protein: 6.4 g/dL — ABNORMAL LOW (ref 6.5–8.1)

## 2022-09-12 LAB — URINALYSIS, MICROSCOPIC (REFLEX)
RBC / HPF: NONE SEEN RBC/hpf (ref 0–5)
WBC, UA: NONE SEEN WBC/hpf (ref 0–5)

## 2022-09-12 LAB — CBG MONITORING, ED: Glucose-Capillary: 243 mg/dL — ABNORMAL HIGH (ref 70–99)

## 2022-09-12 LAB — URINALYSIS, ROUTINE W REFLEX MICROSCOPIC
Bilirubin Urine: NEGATIVE
Glucose, UA: >=500 mg/dL — AB
Hgb urine dipstick: NEGATIVE
Ketones, ur: NEGATIVE mg/dL
Leukocytes,Ua: NEGATIVE
Nitrite: NEGATIVE
Protein, ur: NEGATIVE mg/dL
Specific Gravity, Urine: <=1.005 (ref 1.005–1.030)
pH: 6 (ref 5.0–8.0)

## 2022-09-12 LAB — TROPONIN I (HIGH SENSITIVITY)
Troponin I (High Sensitivity): 15 ng/L (ref <18)
Troponin I (High Sensitivity): 15 ng/L (ref <18)

## 2022-09-12 LAB — MRSA NEXT GEN BY PCR, NASAL: MRSA by PCR Next Gen: NOT DETECTED

## 2022-09-12 LAB — GLUCOSE, CAPILLARY: Glucose-Capillary: 267 mg/dL — ABNORMAL HIGH (ref 70–99)

## 2022-09-12 MED ORDER — CARVEDILOL 6.25 MG PO TABS
6.2500 mg | ORAL_TABLET | Freq: Two times a day (BID) | ORAL | Status: DC
  Administered 2022-09-13 – 2022-09-15 (×5): 6.25 mg via ORAL
  Filled 2022-09-12 (×5): qty 1

## 2022-09-12 MED ORDER — SERTRALINE HCL 25 MG PO TABS
50.0000 mg | ORAL_TABLET | Freq: Every day | ORAL | Status: DC
  Administered 2022-09-13 – 2022-09-14 (×2): 50 mg via ORAL
  Filled 2022-09-12 (×2): qty 2

## 2022-09-12 MED ORDER — APIXABAN 5 MG PO TABS
5.0000 mg | ORAL_TABLET | Freq: Two times a day (BID) | ORAL | Status: DC
  Administered 2022-09-13 – 2022-09-15 (×6): 5 mg via ORAL
  Filled 2022-09-12 (×6): qty 1

## 2022-09-12 MED ORDER — ACETAMINOPHEN 500 MG PO TABS
1000.0000 mg | ORAL_TABLET | Freq: Every day | ORAL | Status: DC
  Administered 2022-09-13 – 2022-09-15 (×3): 1000 mg via ORAL
  Filled 2022-09-12 (×4): qty 2

## 2022-09-12 MED ORDER — POLYVINYL ALCOHOL 1.4 % OP SOLN
1.0000 [drp] | OPHTHALMIC | Status: DC | PRN
  Filled 2022-09-12: qty 15

## 2022-09-12 MED ORDER — ONDANSETRON HCL 4 MG PO TABS: 4.0000 mg | ORAL_TABLET | Freq: Four times a day (QID) | ORAL | Status: DC | PRN

## 2022-09-12 MED ORDER — ACETAMINOPHEN 650 MG RE SUPP: 650.0000 mg | Freq: Four times a day (QID) | RECTAL | Status: DC | PRN

## 2022-09-12 MED ORDER — INSULIN ASPART 100 UNIT/ML IJ SOLN
0.0000 [IU] | Freq: Every day | INTRAMUSCULAR | Status: DC
  Administered 2022-09-13 (×2): 3 [IU] via SUBCUTANEOUS
  Administered 2022-09-14: 4 [IU] via SUBCUTANEOUS

## 2022-09-12 MED ORDER — ONDANSETRON HCL 4 MG/2ML IJ SOLN: 4.0000 mg | Freq: Four times a day (QID) | INTRAMUSCULAR | Status: DC | PRN

## 2022-09-12 MED ORDER — PREDNISONE 10 MG PO TABS
15.0000 mg | ORAL_TABLET | Freq: Every day | ORAL | Status: DC
  Administered 2022-09-13 – 2022-09-15 (×3): 15 mg via ORAL
  Filled 2022-09-12 (×3): qty 2

## 2022-09-12 MED ORDER — ACARBOSE 25 MG PO TABS
100.0000 mg | ORAL_TABLET | Freq: Three times a day (TID) | ORAL | Status: DC
  Administered 2022-09-13: 100 mg via ORAL
  Filled 2022-09-12: qty 4
  Filled 2022-09-12: qty 1
  Filled 2022-09-12: qty 4

## 2022-09-12 MED ORDER — INSULIN DETEMIR 100 UNIT/ML ~~LOC~~ SOLN
30.0000 [IU] | Freq: Every day | SUBCUTANEOUS | Status: DC
  Filled 2022-09-12 (×2): qty 0.3

## 2022-09-12 MED ORDER — ACETAMINOPHEN 325 MG PO TABS
650.0000 mg | ORAL_TABLET | Freq: Four times a day (QID) | ORAL | Status: DC | PRN
  Administered 2022-09-12 – 2022-09-14 (×3): 650 mg via ORAL
  Filled 2022-09-12 (×3): qty 2

## 2022-09-12 MED ORDER — INSULIN ASPART 100 UNIT/ML IJ SOLN
0.0000 [IU] | Freq: Three times a day (TID) | INTRAMUSCULAR | Status: DC
  Administered 2022-09-13: 2 [IU] via SUBCUTANEOUS
  Administered 2022-09-13: 3 [IU] via SUBCUTANEOUS

## 2022-09-12 MED ORDER — TORSEMIDE 20 MG PO TABS
20.0000 mg | ORAL_TABLET | Freq: Every day | ORAL | Status: DC
  Administered 2022-09-13 – 2022-09-15 (×3): 20 mg via ORAL
  Filled 2022-09-12 (×3): qty 1

## 2022-09-12 MED ORDER — LEVOTHYROXINE SODIUM 75 MCG PO TABS
75.0000 ug | ORAL_TABLET | Freq: Every day | ORAL | Status: DC
  Administered 2022-09-13 – 2022-09-15 (×3): 75 ug via ORAL
  Filled 2022-09-12 (×3): qty 1

## 2022-09-12 MED ORDER — ACETAMINOPHEN 500 MG PO TABS: 500.0000 mg | ORAL_TABLET | ORAL | Status: DC

## 2022-09-12 MED ORDER — PANTOPRAZOLE SODIUM 40 MG PO TBEC
40.0000 mg | DELAYED_RELEASE_TABLET | Freq: Every day | ORAL | Status: DC
  Administered 2022-09-13 – 2022-09-15 (×3): 40 mg via ORAL
  Filled 2022-09-12 (×3): qty 1

## 2022-09-12 MED ORDER — FOLIC ACID 1 MG PO TABS
2.0000 mg | ORAL_TABLET | Freq: Every day | ORAL | Status: DC
  Administered 2022-09-13 – 2022-09-15 (×3): 2 mg via ORAL
  Filled 2022-09-12 (×3): qty 2

## 2022-09-12 MED ORDER — ACETAMINOPHEN 500 MG PO TABS
500.0000 mg | ORAL_TABLET | Freq: Every day | ORAL | Status: DC
  Administered 2022-09-13 – 2022-09-14 (×2): 500 mg via ORAL
  Filled 2022-09-12 (×2): qty 1

## 2022-09-12 MED ORDER — SODIUM CHLORIDE 0.9% FLUSH
3.0000 mL | Freq: Two times a day (BID) | INTRAVENOUS | Status: DC
  Administered 2022-09-12 – 2022-09-15 (×6): 3 mL via INTRAVENOUS

## 2022-09-12 MED ORDER — CALCIUM CARBONATE 1250 (500 CA) MG PO TABS
1250.0000 mg | ORAL_TABLET | Freq: Every day | ORAL | Status: DC
  Administered 2022-09-13 – 2022-09-15 (×3): 1250 mg via ORAL
  Filled 2022-09-12 (×3): qty 1

## 2022-09-12 MED ORDER — LATANOPROST 0.005 % OP SOLN
1.0000 [drp] | Freq: Every day | OPHTHALMIC | Status: DC
  Administered 2022-09-13 – 2022-09-14 (×3): 1 [drp] via OPHTHALMIC
  Filled 2022-09-12: qty 2.5

## 2022-09-12 NOTE — ED Triage Notes (Signed)
Known left arm and leg pain  and weakness .  Spouse reports she was assisting pt with his walker while walking  , had a pseudo seizure activity when he was rigid and eyes starring at the ceiling . Did not fall . No Hx seizures . She reports it lasted about 2 minutes .  Pt alert and oriented x 4 , wheeled to triage .

## 2022-09-12 NOTE — H&P (Addendum)
History and Physical    Patient: [name] DOB: 25-Feb-1951 DOA: 09/12/2022 DOS: the patient was seen and examined on 09/12/2022 PCP: Everrett Coombe, DO  Patient coming from: Home  Chief Complaint:  Chief Complaint  Patient presents with   Loss of Consciousness   HPI: [name] is a 72 y.o. male with medical history significant of necrotizing autoimmune myopathy, rheumatoid arthritis, chronic hiccups, type 2 diabetes mellitus, history of DVT and PE on Eliquis, NSTEMI as a result of his PE, CHF, and CKD who presented after an episode this morning at approximately 9:30.  The patient's wife provides most of the history.  She says this morning he had a lot of extra complaints about pain in his legs.  They were on their way to the bathroom and the patient called out for help.  He sat down in his rolling walker.  She pushed him into the bathroom and once they got to the bathroom he leaned back in the walker seat and stiffened up.  She says his eyes rolled up and fixed on the ceiling.  He was unresponsive and stiff as a board.  He did not have any visible shaking. He did not he was not incontinent.  But he was stiff as a board staring at the ceiling.  This lasted several seconds.  When he came back to his normal level of consciousness he seemed to be fine.  He had no complaints, was unaware of the event that just happened.  This is the third time his wife has seen him to do this.  The last time was 2 weeks ago and he was brought in for evaluation.  At that time he was found to be dehydrated and discharged.  A year ago this happened and  they found that he had the acute PE and acute NSTEMI.  So when this happened this morning the patient was worried that perhaps he was having a heart attack.     Review of Systems: As mentioned in the history of present illness. All other systems reviewed and are negative. Past Medical History:  Diagnosis Date   Autoimmune disease (HCC)    Bronchitis     Chronic hiccups    Diabetes mellitus without complication (HCC)    DM2 (diabetes mellitus, type 2) (HCC) 09/28/2014   DVT (deep venous thrombosis) (HCC)    GERD (gastroesophageal reflux disease)    Hypogonadism in male 04/01/2018   Hypothyroidism    Joint pain    Necrotizing myopathy    NSTEMI (non-ST elevated myocardial infarction) (HCC)    Posttraumatic stress disorder 01/30/2022   PTSD (post-traumatic stress disorder)    Pulmonary embolism (HCC)    Rheumatoid arthritis (HCC) 08/09/2021   RSV (respiratory syncytial virus pneumonia) 03/11/2022   Past Surgical History:  Procedure Laterality Date   BIOPSY SHOULDER     Left   IR ANGIOGRAM PULMONARY BILATERAL SELECTIVE  07/22/2021   IR THROMBECT VENO MECH MOD SED  07/22/2021   IR US GUIDE VASC ACCESS LEFT  07/22/2021   RADIOLOGY WITH ANESTHESIA Right 07/22/2021   Procedure: IR WITH ANESTHESIA;  Surgeon: Radiologist, Medication, MD;  Location: [facility] OR;  Service: Radiology;  Laterality: Right;   UPPER LEG SOFT TISSUE BIOPSY Right    Social History:  reports that he has never smoked. He has never used smokeless tobacco. He reports that he does not drink alcohol and does not use drugs.  Allergies  Allergen Reactions   Benazepril Hives and Cough  Gabapentin Swelling   Hydrocodone Hives and Itching   Shellfish Allergy Diarrhea and Nausea And Vomiting    Family History  Problem Relation Age of Onset   Hypertension Mother    Diabetes Mother    Arthritis Mother    Depression Mother    Heart attack Father    Hypertension Father     Prior to Admission medications   Medication Sig Start Date End Date Taking? Authorizing Provider  acarbose (PRECOSE) 100 MG tablet Take 100 mg by mouth 3 (three) times daily with meals.    [provider]  acetaminophen (TYLENOL) 500 MG tablet Take 500-1,000 mg by mouth See admin instructions. Take 1000 mg by mouth in the morning and 500 mg at bedtime    [provider]  AMBULATORY NON  FORMULARY MEDICATION Please provide 3 in 1 bedside commode.  Dx:R29.6, M62.81 04/16/22   Everrett Coombe, DO  AMBULATORY NON FORMULARY MEDICATION Please provide bilateral thigh high compression stockings: Graded compression of 20-74mmhg 09/05/22   Everrett Coombe, DO  apixaban (ELIQUIS) 5 MG TABS tablet Take 1 tablet (5 mg total) by mouth 2 (two) times daily. 08/09/22   Alver Sorrow, NP  baclofen (LIORESAL) 20 MG tablet TAKE 1 TABLET BY MOUTH TWICE A DAY 01/30/22   Everrett Coombe, DO  calcium carbonate (OS-CAL) 600 MG TABS tablet Take 600 mg by mouth daily.    [provider]  Calcium Carbonate Antacid (TUMS PO) Take 3 tablets by mouth 2 (two) times daily as needed (heartburn).    [provider]  carvedilol (COREG) 6.25 MG tablet Take 1 tablet (6.25 mg total) by mouth 2 (two) times daily. 03/15/22   Elgergawy, Leana Roe, MD  folic acid (FOLVITE) 1 MG tablet Take 2 mg by mouth daily.    [provider]  insulin glargine (LANTUS) 100 UNIT/ML injection Inject 0.24 mLs (24 Units total) into the skin daily. 03/24/22   Danford, Earl Lites, MD  ipratropium-albuterol (DUONEB) 0.5-2.5 (3) MG/3ML SOLN Use 1 vial (3mL) by nebulization 4 (four) times daily. Patient taking differently: Take 3 mLs by nebulization as needed (wheezing/SOB). 03/15/22   Elgergawy, Leana Roe, MD  latanoprost (XALATAN) 0.005 % ophthalmic solution 1 drop at bedtime. 08/17/22   [provider]  Levothyroxine Sodium 75 MCG CAPS Take 75 mcg by mouth daily.    [provider]  loratadine (CLARITIN) 10 MG tablet Take 10 mg by mouth daily. 12/12/21   [provider]  methotrexate (RHEUMATREX) 5 MG tablet Take 3 tablets by mouth once a week. Caution: Chemotherapy. Protect from light.    [provider]  pantoprazole (PROTONIX) 40 MG tablet Take 1 tablet (40 mg total) by mouth as needed. Patient taking differently: Take 40 mg by mouth daily. 01/09/22   Alver Sorrow, NP  polyvinyl  alcohol (LIQUIFILM TEARS) 1.4 % ophthalmic solution Place 1 drop into both eyes in the morning and at bedtime.    [provider]  predniSONE (DELTASONE) 10 MG tablet Take 10 mg by mouth daily with breakfast. Take with 5 mg tablet to equal 15 mg    [provider]  predniSONE (DELTASONE) 5 MG tablet Take 5 mg by mouth daily with breakfast. Take with 10 mg to make a total of 15 mg    [provider]  PRESCRIPTION MEDICATION Place 1 drop into both eyes in the morning and at bedtime. [facility] -- Unknown Name -- Unknown Dosing    [provider]  sertraline (  ZOLOFT) 25 MG tablet Take 25mg  (one tablet) daily for one week then increase to 50mg  daily. Patient taking differently: Take 50 mg by mouth daily. Take 50mg  daily. 04/06/22   Alver Sorrow, NP  testosterone cypionate (DEPOTESTOSTERONE CYPIONATE) 200 MG/ML injection Inject 0.5 mLs (100 mg total) into the muscle every 7 (seven) days. 09/05/22   Everrett Coombe, DO  torsemide (DEMADEX) 20 MG tablet Take 1 tablet (20 mg total) by mouth daily. May take additional tablet as directed by lymphedema clinic or cardiologist. 08/09/22 05/06/23  Alver Sorrow, NP  triamcinolone (KENALOG) 0.1 % paste APPLY SMALL AMOUNT TO AFFECTED AREA FOUR TIMES A DAY AS NEEDED FOR MOUTH SORE 08/15/22   [provider]    Physical Exam: Vitals:   09/12/22 1630 09/12/22 1730 09/12/22 1844 09/12/22 1900  BP: 139/76 (!) 140/91 (!) 153/79 134/79  Pulse: 82 85 63 73  Resp: 20 (!) 23 19 20   Temp:   98 F (36.7 C) 98 F (36.7 C)  TempSrc:   Oral Oral  SpO2: 97% 98% 100% 100%  Weight:   99.7 kg   Height:   5\' 8"  (1.727 m)    Physical Exam:  General: No acute distress, well developed, well nourished HEENT: Normocephalic, atraumatic, PERRL Cardiovascular: Normal rate and rhythm. Distal pulses intact. Pulmonary: Normal pulmonary effort, normal breath sounds Gastrointestinal: Nondistended abdomen, soft, non-tender,  normoactive bowel sounds, no organomegaly Musculoskeletal: dependent 2+ pitting edema, entire leg Lymphadenopathy: No cervical LAD. Skin: Skin is warm and dry. Neuro: weakness, Alert. PSYCH: Attentive and cooperative  Data Reviewed:  Results for orders placed or performed during the hospital encounter of 09/12/22 (from the past 24 hour(s))  CBG monitoring, ED     Status: Abnormal   Collection Time: 09/12/22  1:22 PM  Result Value Ref Range   Glucose-Capillary 243 (H) 70 - 99 mg/dL   Comment 1 Notify RN   CBC with Differential     Status: Abnormal   Collection Time: 09/12/22  2:24 PM  Result Value Ref Range   WBC 6.4 4.0 - 10.5 K/uL   RBC 2.98 (L) 4.22 - 5.81 MIL/uL   Hemoglobin 9.5 (L) 13.0 - 17.0 g/dL   HCT 40.9 (L) 81.1 - 91.4 %   MCV 96.6 80.0 - 100.0 fL   MCH 31.9 26.0 - 34.0 pg   MCHC 33.0 30.0 - 36.0 g/dL   RDW 78.2 95.6 - 21.3 %   Platelets 242 150 - 400 K/uL   nRBC 0.8 (H) 0.0 - 0.2 %   Neutrophils Relative % 78 %   Neutro Abs 5.0 1.7 - 7.7 K/uL   Lymphocytes Relative 14 %   Lymphs Abs 0.9 0.7 - 4.0 K/uL   Monocytes Relative 4 %   Monocytes Absolute 0.3 0.1 - 1.0 K/uL   Eosinophils Relative 1 %   Eosinophils Absolute 0.0 0.0 - 0.5 K/uL   Basophils Relative 0 %   Basophils Absolute 0.0 0.0 - 0.1 K/uL   Immature Granulocytes 3 %   Abs Immature Granulocytes 0.17 (H) 0.00 - 0.07 K/uL  Comprehensive metabolic panel     Status: Abnormal   Collection Time: 09/12/22  2:24 PM  Result Value Ref Range   Sodium 135 135 - 145 mmol/L   Potassium 4.5 3.5 - 5.1 mmol/L   Chloride 100 98 - 111 mmol/L   CO2 25 22 - 32 mmol/L   Glucose, Bld 284 (H) 70 - 99 mg/dL   BUN 23 8 - 23 mg/dL  Creatinine, Ser 1.27 (H) 0.61 - 1.24 mg/dL   Calcium 9.2 8.9 - 16.1 mg/dL   Total Protein 6.4 (L) 6.5 - 8.1 g/dL   Albumin 3.9 3.5 - 5.0 g/dL   AST 24 15 - 41 U/L   ALT 21 0 - 44 U/L   Alkaline Phosphatase 49 38 - 126 U/L   Total Bilirubin 0.6 0.3 - 1.2 mg/dL   GFR, Estimated 60 (L) >60  mL/min   Anion gap 10 5 - 15  Troponin I (High Sensitivity)     Status: None   Collection Time: 09/12/22  2:24 PM  Result Value Ref Range   Troponin I (High Sensitivity) 15 <18 ng/L  Urinalysis, Routine w reflex microscopic -Urine, Clean Catch     Status: Abnormal   Collection Time: 09/12/22  2:24 PM  Result Value Ref Range   Color, Urine YELLOW YELLOW   APPearance CLEAR CLEAR   Specific Gravity, Urine <=1.005 1.005 - 1.030   pH 6.0 5.0 - 8.0   Glucose, UA >=500 (A) NEGATIVE mg/dL   Hgb urine dipstick NEGATIVE NEGATIVE   Bilirubin Urine NEGATIVE NEGATIVE   Ketones, ur NEGATIVE NEGATIVE mg/dL   Protein, ur NEGATIVE NEGATIVE mg/dL   Nitrite NEGATIVE NEGATIVE   Leukocytes,Ua NEGATIVE NEGATIVE  Urinalysis, Microscopic (reflex)     Status: Abnormal   Collection Time: 09/12/22  2:24 PM  Result Value Ref Range   RBC / HPF NONE SEEN 0 - 5 RBC/hpf   WBC, UA NONE SEEN 0 - 5 WBC/hpf   Bacteria, UA RARE (A) NONE SEEN   Squamous Epithelial / HPF 0-5 0 - 5 /HPF  Troponin I (High Sensitivity)     Status: None   Collection Time: 09/12/22  3:27 PM  Result Value Ref Range   Troponin I (High Sensitivity) 15 <18 ng/L  Glucose, capillary     Status: Abnormal   Collection Time: 09/12/22  6:53 PM  Result Value Ref Range   Glucose-Capillary 267 (H) 70 - 99 mg/dL  MRSA Next Gen by PCR, Nasal     Status: None   Collection Time: 09/12/22  6:55 PM   Specimen: Nasal Mucosa; Nasal Swab  Result Value Ref Range   MRSA by PCR Next Gen NOT DETECTED NOT DETECTED     Assessment and Plan: 1- Episode of Loss of consciousness and stiffening and staring-seizure versus syncope -Brain MRI Ordered -EEG ordered -Neurology consultation needed in am  2-chronic hiccups -Continue baclofen  3- DMT2- - continue daily Lantus Patient been placed on Accu-Cheks before every meal and nightly with sliding scale insulin Diabetic Diet  4-Necrotizing myopathy / Rheumatoid Arthritis -in clarification of his home  medications but will continue his daily prednisone and methotrexate. -Fall precautions  5- Changing mole on patients back - outpatient Dermatology follow up  6- Bilateral lower ext edema with weeping and right lower ext wound  - Continue diuretic  7. H/o PE - Continue Eliquis     Advance Care Planning:   Code Status: Full Code CODE STATUS was discussed with the patient and his wife who he names as his surrogate decision maker.  They want him to be full code.  He would like to prepare written advance directives.  Consults: Neurology needed  Family Communication: Spoke with the patient and his wife.  Severity of Illness: The appropriate patient status for this patient is INPATIENT. Inpatient status is judged to be reasonable and necessary in order to provide the required intensity of service to ensure  the patient's safety. The patient's presenting symptoms, physical exam findings, and initial radiographic and laboratory data in the context of their chronic comorbidities is felt to place them at high risk for further clinical deterioration. Furthermore, it is not anticipated that the patient will be medically stable for discharge from the hospital within 2 midnights of admission.   * I certify that at the point of admission it is my clinical judgment that the patient will require inpatient hospital care spanning beyond 2 midnights from the point of admission due to high intensity of service, high risk for further deterioration and high frequency of surveillance required.*  Author: Buena Irish, MD 09/12/2022 9:28 PM  For on call review [email or web address].

## 2022-09-12 NOTE — ED Provider Notes (Signed)
[facility] Provider Note   CSN: [id number] Arrival date & time: 09/12/22  1316     History  Chief Complaint  Patient presents with   Loss of Consciousness    Lance Malinak is a 72 y.o. male.  Patient here after episode of loss of consciousness.  History of diabetes, acid reflux, autoimmune disease, chronic hiccups, PE and DVT on Eliquis.  Patient was ambulating with his walker to the bathroom.  He is having some acute on chronic left leg pain.  But when wife was assisting him he suddenly sat back in his walker and his whole body stiffened up for about 1 to 2 minutes.  His eyes were fixated, but did not have full body shaking.  Did not fall on the floor.  He did not have any incontinence.  When he came back to maybe brief confusion but he has been back at his baseline fairly quickly.  No history of seizures.  The history is provided by the patient.       Home Medications Prior to Admission medications   Medication Sig Start Date End Date Taking? Authorizing Provider  acarbose (PRECOSE) 100 MG tablet Take 100 mg by mouth 3 (three) times daily with meals.    [provider]  acetaminophen (TYLENOL) 500 MG tablet Take 500-1,000 mg by mouth See admin instructions. Take 1000 mg by mouth in the morning and 500 mg at bedtime    [provider]  AMBULATORY NON FORMULARY MEDICATION Please provide 3 in 1 bedside commode.  Dx:R29.6, M62.81 04/16/22   Everrett Coombe, DO  AMBULATORY NON FORMULARY MEDICATION Please provide bilateral thigh high compression stockings: Graded compression of 20-31mmhg 09/05/22   Everrett Coombe, DO  apixaban (ELIQUIS) 5 MG TABS tablet Take 1 tablet (5 mg total) by mouth 2 (two) times daily. 08/09/22   Alver Sorrow, NP  baclofen (LIORESAL) 20 MG tablet TAKE 1 TABLET BY MOUTH TWICE A DAY 01/30/22   Everrett Coombe, DO  calcium carbonate (OS-CAL) 600 MG TABS tablet Take 600 mg by mouth daily.    [provider]  Calcium Carbonate Antacid (TUMS PO) Take 3 tablets by mouth 2 (two) times daily as needed (heartburn).    [provider]  carvedilol (COREG) 6.25 MG tablet Take 1 tablet (6.25 mg total) by mouth 2 (two) times daily. 03/15/22   Elgergawy, Leana Roe, MD  folic acid (FOLVITE) 1 MG tablet Take 2 mg by mouth daily.    [provider]  insulin glargine (LANTUS) 100 UNIT/ML injection Inject 0.24 mLs (24 Units total) into the skin daily. 03/24/22   Danford, Earl Lites, MD  ipratropium-albuterol (DUONEB) 0.5-2.5 (3) MG/3ML SOLN Use 1 vial (3mL) by nebulization 4 (four) times daily. Patient taking differently: Take 3 mLs by nebulization as needed (wheezing/SOB). 03/15/22   Elgergawy, Leana Roe, MD  latanoprost (XALATAN) 0.005 % ophthalmic solution 1 drop at bedtime. 08/17/22   [provider]  Levothyroxine Sodium 75 MCG CAPS Take 75 mcg by mouth daily.    [provider]  loratadine (CLARITIN) 10 MG tablet Take 10 mg by mouth daily. 12/12/21   [provider]  methotrexate (RHEUMATREX) 5 MG tablet Take 3 tablets by mouth once a week. Caution: Chemotherapy. Protect from light.    [provider]  pantoprazole (PROTONIX) 40 MG tablet Take 1 tablet (40 mg total) by mouth as needed. Patient taking differently: Take 40 mg by mouth daily. 01/09/22   Dan Humphreys,  Storm Frisk, NP  polyvinyl alcohol (LIQUIFILM TEARS) 1.4 % ophthalmic solution Place 1 drop into both eyes in the morning and at bedtime.    [provider]  predniSONE (DELTASONE) 10 MG tablet Take 10 mg by mouth daily with breakfast. Take with 5 mg tablet to equal 15 mg    [provider]  predniSONE (DELTASONE) 5 MG tablet Take 5 mg by mouth daily with breakfast. Take with 10 mg to make a total of 15 mg    [provider]  PRESCRIPTION MEDICATION Place 1 drop into both eyes in the morning and at bedtime. [facility] -- Unknown Name -- Unknown Dosing     [provider]  sertraline (ZOLOFT) 25 MG tablet Take 25mg  (one tablet) daily for one week then increase to 50mg  daily. Patient taking differently: Take 50 mg by mouth daily. Take 50mg  daily. 04/06/22   Alver Sorrow, NP  testosterone cypionate (DEPOTESTOSTERONE CYPIONATE) 200 MG/ML injection Inject 0.5 mLs (100 mg total) into the muscle every 7 (seven) days. 09/05/22   Everrett Coombe, DO  torsemide (DEMADEX) 20 MG tablet Take 1 tablet (20 mg total) by mouth daily. May take additional tablet as directed by lymphedema clinic or cardiologist. 08/09/22 05/06/23  Alver Sorrow, NP  triamcinolone (KENALOG) 0.1 % paste APPLY SMALL AMOUNT TO AFFECTED AREA FOUR TIMES A DAY AS NEEDED FOR MOUTH SORE 08/15/22   [provider]      Allergies    Benazepril, Gabapentin, Hydrocodone, and Shellfish allergy    Review of Systems   Review of Systems  Physical Exam Updated Vital Signs BP 134/81 (BP Location: Left Arm)   Pulse 78   Temp 97.7 F (36.5 C)   Resp 20   Ht 5\' 8"  (1.727 m)   Wt 95.3 kg   SpO2 98%   BMI 31.93 kg/m  Physical Exam Vitals and nursing note reviewed.  Constitutional:      General: He is not in acute distress.    Appearance: He is well-developed.  HENT:     Head: Normocephalic and atraumatic.  Eyes:     Extraocular Movements: Extraocular movements intact.     Conjunctiva/sclera: Conjunctivae normal.     Pupils: Pupils are equal, round, and reactive to light.  Cardiovascular:     Rate and Rhythm: Normal rate and regular rhythm.     Heart sounds: No murmur heard. Pulmonary:     Effort: Pulmonary effort is normal. No respiratory distress.     Breath sounds: Normal breath sounds.  Abdominal:     Palpations: Abdomen is soft.     Tenderness: There is no abdominal tenderness.  Musculoskeletal:        General: No swelling.     Cervical back: Neck supple.     Right lower leg: Edema present.     Left lower leg: Edema present.     Comments: Bilateral  pitting edema to both legs symmetric  Skin:    General: Skin is warm and dry.     Capillary Refill: Capillary refill takes less than 2 seconds.     Comments: Chronic ulcer.  Wound to the right shin  Neurological:     General: No focal deficit present.     Mental Status: He is alert and oriented to person, place, and time.     Cranial Nerves: No cranial nerve deficit.     Sensory: No sensory deficit.     Motor: No weakness.     Coordination:  Coordination normal.     Comments: Appears to have symmetric strength but may be trace weakness on the left which sounds like baseline and sensation throughout, normal speech, normal visual fields, no facial droop, cranial nerves intact  Psychiatric:        Mood and Affect: Mood normal.     ED Results / Procedures / Treatments   Labs (all labs ordered are listed, but only abnormal results are displayed) Labs Reviewed  CBC WITH DIFFERENTIAL/PLATELET - Abnormal; Notable for the following components:      Result Value   RBC 2.98 (*)    Hemoglobin 9.5 (*)    HCT 28.8 (*)    nRBC 0.8 (*)    Abs Immature Granulocytes 0.17 (*)    All other components within normal limits  URINALYSIS, ROUTINE W REFLEX MICROSCOPIC - Abnormal; Notable for the following components:   Glucose, UA >=500 (*)    All other components within normal limits  URINALYSIS, MICROSCOPIC (REFLEX) - Abnormal; Notable for the following components:   Bacteria, UA RARE (*)    All other components within normal limits  CBG MONITORING, ED - Abnormal; Notable for the following components:   Glucose-Capillary 243 (*)    All other components within normal limits  COMPREHENSIVE METABOLIC PANEL  TROPONIN I (HIGH SENSITIVITY)    EKG EKG Interpretation  Date/Time:  Wednesday Sep 12 2022 13:35:44 EDT Ventricular Rate:  80 PR Interval:  154 QRS Duration: 93 QT Interval:  397 QTC Calculation: 458 R Axis:   -21 Text Interpretation: Sinus rhythm Abnormal R-wave progression, early  transition Left ventricular hypertrophy Confirmed by Virgina Norfolk ([id number]) on 09/12/2022 1:42:37 PM  Radiology DG Chest Portable 1 View  Result Date: 09/12/2022 CLINICAL DATA:  Loss of consciousness EXAM: PORTABLE CHEST 1 VIEW COMPARISON:  08/26/2022 x-ray and older FINDINGS: Stable enlarged cardiopericardial silhouette. Improving left basilar atelectasis. No consolidation, pneumothorax, effusion or edema. Overlapping cardiac leads. Degenerative changes seen of the shoulders. IMPRESSION: Improving left basilar atelectasis. Electronically Signed   By: Karen Kays M.D.   On: 09/12/2022 14:36   CT Head Wo Contrast  Result Date: 09/12/2022 CLINICAL DATA:  Mental status change, unknown cause EXAM: CT HEAD WITHOUT CONTRAST TECHNIQUE: Contiguous axial images were obtained from the base of the skull through the vertex without intravenous contrast. RADIATION DOSE REDUCTION: This exam was performed according to the departmental dose-optimization program which includes automated exposure control, adjustment of the mA and/or kV according to patient size and/or use of iterative reconstruction technique. COMPARISON:  08/26/2022 FINDINGS: Brain: No evidence of acute infarction, hemorrhage, hydrocephalus, extra-axial collection or mass lesion/mass effect. Scattered low-density changes within the periventricular and subcortical white matter most compatible with chronic microvascular ischemic change. Mild diffuse cerebral volume loss. Vascular: Atherosclerotic calcifications involving the large vessels of the skull base. No unexpected hyperdense vessel. Skull: Normal. Negative for fracture or focal lesion. Sinuses/Orbits: Similar degree of right-sided paranasal sinus mucosal thickening. Other: None. IMPRESSION: 1. No acute intracranial findings. 2. Chronic microvascular ischemic change and cerebral volume loss. Electronically Signed   By: Duanne Guess D.O.   On: 09/12/2022 14:19    Procedures Procedures     Medications Ordered in ED Medications - No data to display  ED Course/ Medical Decision Making/ A&P                             Medical Decision Making Amount and/or Complexity of Data Reviewed Labs: ordered. Radiology: ordered.  Risk Decision regarding hospitalization.   Perrion Garfield is here after episode of loss of consciousness.  Normal vitals.  No fever.  History of PTSD, diabetes, heart failure, PE on Eliquis, blood clot, diabetes.  Patient was ambulating with his walker with his wife when he sat back down on the walker and his whole body got stiff and he stared off and had may be some shaking but was mostly stiff.  Lasted maybe 2 minutes.  Was a little bit confused maybe afterwards but quickly returned to his baseline.  He did not have any incontinence.  Possibly an episode like this a few weeks ago as well.  No seizure history.  He has no complaints currently.  He was dealing with some acute on chronic back and left leg pain during this episode or just prior to this episode.  Not sure if this is a strong pain response.  Ultimately differential diagnosis could be that this was a seizure-like activity or cardiac arrhythmia or may be a vagal response however his orthostatics here are normal.  No fever.  He is neurologically intact.  Some left-sided weakness but I think that might be more functional.  His neuroexam seems to be normal.  Will check basic labs, troponin, urinalysis, chest x-ray and head CT but do anticipate admission for syncope observation.  Would probably benefit from an echocardiogram, telemetry and EEG.  Per my review and interpretation of labs there is no significant anemia or leukocytosis.  Urinalysis negative for infection.  Chest x-ray per my review and interpretation shows no obvious pneumonia or volume overload.  Head CT is overall unremarkable per radiology report as well.  Overall patient is at his baseline.  Possibly an episode like this a few weeks ago.  Overall  syncopal event.  Will admit for further care to medicine.  This chart was dictated using voice recognition software.  Despite best efforts to proofread,  errors can occur which can change the documentation meaning.         Final Clinical Impression(s) / ED Diagnoses Final diagnoses:  Syncope and collapse    Rx / DC Orders ED Discharge Orders     None         Virgina Norfolk, DO 09/12/22 1443

## 2022-09-12 NOTE — Telephone Encounter (Signed)
Patient's wife advised of Dr Ashley Royalty recommendations. She agreed. I will cancel his appointment.

## 2022-09-12 NOTE — Telephone Encounter (Signed)
Cala Bradford called this morning to get an appointment for her husband, [name]. She states he is dehydrated. She states he had an episode this morning while trying to go to the rest room. He went stiff and could not speak and his eyes rolled up. I advised her to take him to the ED. She has kept the appointment for this afternoon.

## 2022-09-13 ENCOUNTER — Inpatient Hospital Stay (HOSPITAL_COMMUNITY): Payer: Medicare Other

## 2022-09-13 ENCOUNTER — Encounter (HOSPITAL_COMMUNITY): Payer: Self-pay | Admitting: Internal Medicine

## 2022-09-13 DIAGNOSIS — R569 Unspecified convulsions: Secondary | ICD-10-CM

## 2022-09-13 DIAGNOSIS — I5031 Acute diastolic (congestive) heart failure: Secondary | ICD-10-CM | POA: Diagnosis not present

## 2022-09-13 DIAGNOSIS — R4182 Altered mental status, unspecified: Secondary | ICD-10-CM | POA: Diagnosis not present

## 2022-09-13 DIAGNOSIS — I890 Lymphedema, not elsewhere classified: Secondary | ICD-10-CM | POA: Diagnosis not present

## 2022-09-13 DIAGNOSIS — R55 Syncope and collapse: Principal | ICD-10-CM

## 2022-09-13 LAB — COMPREHENSIVE METABOLIC PANEL
ALT: 21 U/L (ref 0–44)
AST: 20 U/L (ref 15–41)
Albumin: 3.7 g/dL (ref 3.5–5.0)
Alkaline Phosphatase: 45 U/L (ref 38–126)
Anion gap: 10 (ref 5–15)
BUN: 17 mg/dL (ref 8–23)
CO2: 25 mmol/L (ref 22–32)
Calcium: 9.7 mg/dL (ref 8.9–10.3)
Chloride: 104 mmol/L (ref 98–111)
Creatinine, Ser: 1.22 mg/dL (ref 0.61–1.24)
GFR, Estimated: >60 mL/min (ref >60)
Glucose, Bld: 290 mg/dL — ABNORMAL HIGH (ref 70–99)
Potassium: 4 mmol/L (ref 3.5–5.1)
Sodium: 139 mmol/L (ref 135–145)
Total Bilirubin: 0.6 mg/dL (ref 0.3–1.2)
Total Protein: 5.8 g/dL — ABNORMAL LOW (ref 6.5–8.1)

## 2022-09-13 LAB — GLUCOSE, CAPILLARY
Glucose-Capillary: 153 mg/dL — ABNORMAL HIGH (ref 70–99)
Glucose-Capillary: 217 mg/dL — ABNORMAL HIGH (ref 70–99)
Glucose-Capillary: 283 mg/dL — ABNORMAL HIGH (ref 70–99)
Glucose-Capillary: 294 mg/dL — ABNORMAL HIGH (ref 70–99)
Glucose-Capillary: 423 mg/dL — ABNORMAL HIGH (ref 70–99)
Glucose-Capillary: 476 mg/dL — ABNORMAL HIGH (ref 70–99)

## 2022-09-13 LAB — ECHOCARDIOGRAM COMPLETE
Area-P 1/2: 3.53 cm2
Calc EF: 54.8 %
Height: 68 in
MV VTI: 3.04 cm2
S' Lateral: 3.1 cm
Single Plane A2C EF: 53.5 %
Single Plane A4C EF: 58.6 %
Weight: 3516.8 oz

## 2022-09-13 LAB — HEMOGLOBIN A1C
Hgb A1c MFr Bld: 9.9 % — ABNORMAL HIGH (ref 4.8–5.6)
Mean Plasma Glucose: 237.43 mg/dL

## 2022-09-13 MED ORDER — FENTANYL CITRATE PF 50 MCG/ML IJ SOSY: 25.0000 ug | PREFILLED_SYRINGE | INTRAMUSCULAR | Status: DC | PRN

## 2022-09-13 MED ORDER — NALOXONE HCL 0.4 MG/ML IJ SOLN: 0.4000 mg | INTRAMUSCULAR | Status: DC | PRN

## 2022-09-13 MED ORDER — INSULIN DETEMIR 100 UNIT/ML ~~LOC~~ SOLN
30.0000 [IU] | Freq: Every day | SUBCUTANEOUS | Status: DC
  Administered 2022-09-13: 30 [IU] via SUBCUTANEOUS
  Filled 2022-09-13 (×2): qty 0.3

## 2022-09-13 MED ORDER — BACLOFEN 10 MG PO TABS
20.0000 mg | ORAL_TABLET | Freq: Two times a day (BID) | ORAL | Status: DC
  Administered 2022-09-14 – 2022-09-15 (×4): 20 mg via ORAL
  Filled 2022-09-13 (×4): qty 2

## 2022-09-13 MED ORDER — INSULIN ASPART 100 UNIT/ML IJ SOLN
22.0000 [IU] | Freq: Once | INTRAMUSCULAR | Status: AC
  Administered 2022-09-13: 22 [IU] via SUBCUTANEOUS

## 2022-09-13 MED ORDER — INSULIN ASPART 100 UNIT/ML IJ SOLN
0.0000 [IU] | Freq: Three times a day (TID) | INTRAMUSCULAR | Status: DC
  Administered 2022-09-14: 4 [IU] via SUBCUTANEOUS
  Administered 2022-09-14: 7 [IU] via SUBCUTANEOUS
  Administered 2022-09-15: 3 [IU] via SUBCUTANEOUS
  Administered 2022-09-15: 25 [IU] via SUBCUTANEOUS

## 2022-09-13 MED ORDER — PERFLUTREN LIPID MICROSPHERE
1.0000 mL | INTRAVENOUS | Status: AC | PRN
  Administered 2022-09-13: 2 mL via INTRAVENOUS

## 2022-09-13 MED ORDER — INSULIN DETEMIR 100 UNIT/ML ~~LOC~~ SOLN
10.0000 [IU] | Freq: Every day | SUBCUTANEOUS | Status: DC
  Filled 2022-09-13: qty 0.1

## 2022-09-13 MED ORDER — FUROSEMIDE 10 MG/ML IJ SOLN
40.0000 mg | Freq: Once | INTRAMUSCULAR | Status: AC
  Administered 2022-09-13: 40 mg via INTRAVENOUS
  Filled 2022-09-13: qty 4

## 2022-09-13 NOTE — TOC Initial Note (Signed)
Transition of Care [facility]) - Initial/Assessment Note    Patient Details  Name: [name] MRN: [id number] Date of Birth: [birth year]  Transition of Care [facility]) CM/SW Contact:    Harriet Masson, RN Phone Number: 09/13/2022, 2:39 PM  Clinical Narrative:                 Spoke to patient regarding transition needs. Patient lives with wife and has walker.  Wife transports to apts. Address, Phone number and PCP verified. TOC folloiwng for PT, OT recs.    Barriers to Discharge: Continued Medical Work up   Patient Goals and CMS Choice Patient states their goals for this hospitalization and ongoing recovery are:: return home          Expected Discharge Plan and Services       Living arrangements for the past 2 months: Single Family Home                                      Prior Living Arrangements/Services Living arrangements for the past 2 months: Single Family Home Lives with:: Spouse Patient language and need for interpreter reviewed:: Yes Do you feel safe going back to the place where you live?: Yes      Need for Family Participation in Patient Care: Yes (Comment) Care giver support system in place?: Yes (comment) Current home services: DME (walker, cane) Criminal Activity/Legal Involvement Pertinent to Current Situation/Hospitalization: No - Comment as needed  Activities of Daily Living Home Assistive Devices/Equipment: Walker (specify type), Cane (specify quad or straight), Eyeglasses, Dentures (specify type) (partial top) ADL Screening (condition at time of admission) Patient's cognitive ability adequate to safely complete daily activities?: Yes Is the patient deaf or have difficulty hearing?: No Does the patient have difficulty seeing, even when wearing glasses/contacts?: No Does the patient have difficulty concentrating, remembering, or making decisions?: No Patient able to express need for assistance with ADLs?: Yes Does the patient have difficulty  dressing or bathing?: Yes Independently performs ADLs?: No Communication: Independent Dressing (OT): Needs assistance Is this a change from baseline?: Pre-admission baseline Grooming: Independent Feeding: Independent Bathing: Needs assistance Is this a change from baseline?: Pre-admission baseline Toileting: Needs assistance Is this a change from baseline?: Pre-admission baseline In/Out Bed: Needs assistance Is this a change from baseline?: Pre-admission baseline Walks in Home: Needs assistance Is this a change from baseline?: Pre-admission baseline Does the patient have difficulty walking or climbing stairs?: Yes Weakness of Legs: Both Weakness of Arms/Hands: Both  Permission Sought/Granted                  Emotional Assessment Appearance:: Appears stated age            Admission diagnosis:  Syncope and collapse [R55] Syncope [R55] Patient Active Problem List   Diagnosis Date Noted   Syncope 09/12/2022   Venous stasis ulcer without varicose veins (HCC) 09/09/2022   Anemia 09/09/2022   Skin ulcer of buttock (HCC) 09/09/2022   Generalized weakness 09/09/2022   Diarrhea 09/09/2022   Oropharyngeal dysphagia 04/23/2022   Leg swelling 04/23/2022   Confusion 04/23/2022   Type 2 diabetes mellitus with other diabetic ophthalmic complication (HCC) 04/16/2022   Shoulder pain 03/22/2022   Hypoglycemia 03/20/2022   Frequent falls 03/19/2022   Chronic combined systolic and diastolic CHF (congestive heart failure) (HCC) 03/11/2022   CKD (chronic kidney disease), stage III (HCC) 03/11/2022   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  History of DVT (deep vein thrombosis) 03/11/2022   History of pulmonary embolism 03/11/2022   Hyperlipidemia 03/11/2022   Obesity (BMI 30-39.9) 03/11/2022   CAD (coronary artery disease) 01/30/2022   Depression 01/30/2022   Cardiomyopathy (HCC) 01/30/2022   Posttraumatic stress disorder 01/30/2022   Rheumatoid arthritis (HCC) 08/09/2021   New onset  of headaches after age 30 08/09/2021   Non-ST elevation (NSTEMI) myocardial infarction [facility])    Acute pulmonary embolism (HCC) 07/22/2021   Acute DVT (deep venous thrombosis) (HCC) 07/22/2021   Elevated troponin 07/22/2021   Anxiety 07/22/2021   Ankle swelling 01/17/2020   GERD (gastroesophageal reflux disease) 06/04/2018   Hypogonadism in male 04/01/2018   Macrocytic anemia 09/30/2017   Itching 09/30/2017   Hypothyroidism 09/30/2017   Low testosterone 09/30/2017   Benign prostatic hyperplasia without lower urinary tract symptoms 09/30/2017   Erectile dysfunction 03/19/2016   Benign essential HTN 08/08/2015   Branch retinal vein occlusion with macular edema of left eye 03/25/2015   Nuclear sclerotic cataract of both eyes 03/25/2015   Myositis 09/28/2014   DM2 (diabetes mellitus, type 2) (HCC) 09/28/2014   Abnormal glucose 10/04/2012   PCP:  Everrett Coombe, DO Pharmacy:   [id number] - [place], [place] - [address] [place] [address] Phone: [phone] Fax: [phone]  [id number] - [place], [place] - [address] AT [facility] [address] [place] [address] Phone: [phone] Fax: [phone]     Social Determinants of Health (SDOH) Social History: SDOH Screenings   Food Insecurity: No Food Insecurity (08/14/2022)  Housing: Low Risk  (08/14/2022)  Transportation Needs: No Transportation Needs (08/14/2022)  Utilities: Not At Risk (08/14/2022)  Alcohol Screen: Low Risk  (08/14/2022)  Depression (PHQ2-9): Low Risk  (08/14/2022)  Financial Resource Strain: Low Risk  (08/14/2022)  Physical Activity: Insufficiently Active (08/14/2022)  Social Connections: Socially Isolated (08/14/2022)  Stress: No Stress Concern Present (08/14/2022)  Tobacco Use: Low Risk  (09/13/2022)   SDOH Interventions:     Readmission Risk Interventions    03/21/2022    9:18 AM 07/27/2021   10:06 AM 07/26/2021     1:16 PM  Readmission Risk Prevention Plan  Transportation Screening Complete Complete Complete  PCP or Specialist Appt within 5-7 Days   Complete  PCP or Specialist Appt within 3-5 Days  Complete   Home Care Screening   Complete  Medication Review (RN CM)   Complete  HRI or Home Care Consult  Complete   Social Work Consult for Recovery Care Planning/Counseling  Complete   Palliative Care Screening  Not Applicable   Medication Review [employer]) Complete Complete   PCP or Specialist appointment within 3-5 days of discharge Complete    HRI or Home Care Consult Complete    SW Recovery Care/Counseling Consult Complete    Palliative Care Screening Not Applicable    Skilled Nursing Facility Complete

## 2022-09-13 NOTE — Consult Note (Addendum)
Cardiology Consultation   Patient ID: [name] MRN: [id number]; DOB: Sep 26, 1950  Admit date: 09/12/2022 Date of Consult: 09/13/2022  PCP:  Everrett Coombe, DO    [facility] Providers Cardiologist:  Chrystie Nose, MD        Patient Profile:   [name] is a 72 y.o. male with a hx of necrotizing immune myopathy, HTN, DM2, saddle PE 06/2021 with R DVT with associated LV dysfunction at that time with subsequently improved LVEF, mild nonobstructive CAD by cor CT 08/2021, lymphedema followed by lymphedema clinic, PTSD, CKD stage 3a, rheumatoid arthritis, hypothyroidism, OSA, anemia (?cause) who is being seen 09/13/2022 for the evaluation of syncope at the request of Dr. Hanley Ben.  History of Present Illness:   [name] was initially seen by our team in 06/2021 for LV dysfunction EF 35-40% when he had presented with weakness, episodes of confusion, chest pain, abdominal pain, found to have large saddle PE with right heart strain and R DVT. He was on testosterone supplementation at the time. He underwent bilateral pulmonary angiogram with suction thrombectomy by IR.  He was started on anticoagulation, recommended for lifelong. Elevated troponin was ultimately felt due to PE. He also had AKI so ischemic eval deferred outpatient setting, treated with GDMT. He ultimately underwent outpatient cor CT 08/2022 showing only mild nonobstructive CAD (1-24% mild LM, 25-49% ostial/prox LAD, FFR negative). Repeat echo 11/2021 showed normalized LVEF. Imdur was previously stopped due to headache and Entresto was stopped due to hypotension and normal EF. Sleep study 01/2022 confirmed OSA, started on CPAP, referred to Dr. Mayford Knife to manage though do not see she has seen patient.  It appears from chart review he's continued to have frequent presentations with episodic confusion since that time:  - admission 03/11/2022 with altered mental status in setting of RSV/PNA and CHF (EF remained normal) - repeat ED  visit for fall without LOC 03/19/2022 - readmission 03/19/2022 for disorientation in setting of hypoglycemia, dehydration, possibly toxic from baclofen or cefepime, RPR, ammonia OK - PCP visit 03/2022 with confusion, gait unsteadiness, hoarseness, edema (MRI brain, CXR, LE duplex nonacute) at which time he was recommended to see neurology, do not see any notes from them.  - ED visit 08/26/22 for multiple complaints including recent GI illness with malaise, fatigue, confusion, shaking episodes, falls, and headache. Workup was felt reassuring and he was treated with IV fluids - saw PCP 09/05/22 to follow this up and was restarted on testosterone, with recheck labs for continued anemia and PT referral - TSH, B12 wnl  When last seen in our office 07/2022 he was noted to be following with lymphedema clinic and was continued on carvedilol and torsemide, otherwise felt to be doing well. Had been started on spironolactone in 05/2022 but he wrote in reporting that his nephrologist recommended he avoid this.  The patient presented with episode of LOC. The patient himself reports poor short term memory ever since his PE and is not really able to recall events. He has good long term memory but sometimes has difficulty recalling what he had for breakfast or recent events. I tried to call wife but she is not available. Patient reports she will be at bedside later today. Per H/P, " She says this morning he had a lot of extra complaints about pain in his legs.  They were on their way to the bathroom and the patient called out for help.  He sat down in his rolling walker.  She pushed him into the  bathroom and once they got to the bathroom he leaned back in the walker seat and stiffened up.  She says his eyes rolled up and fixed on the ceiling.  He was unresponsive and stiff as a board.  He did not have any visible shaking. He did not he was not incontinent.  But he was stiff as a board staring at the ceiling.  This lasted several  seconds.  When he came back to his normal level of consciousness he seemed to be fine.  He had no complaints, was unaware of the event that just happened." CT head nonacute, + Chronic microvascular ischemic changes and cerebral volume loss. CXR with improving left basilar atelectasis. MRI of the brain shows no acute intracranial abnormality, mild cerebral and cerebellar atrophy with chronic small vessel ischemic disease. Labs notable for Cr 1.27 appearing near baseline, Hgb 9.5 (present since 06/2021), neg trop x 2, A1C 9.9, EEG wnl.  2D echo shows continued normal LVEF with mild LVH, elevated LVEDP, aortic sclerosis without stenosis. Weight stable compared to prior OP Value (220 vs 219lb). EKG NSR without acute ST changes. Telemetry shows no arrhythmias. Orthostatics demonstrated slight rise in HR (82 sitting, 101 standing) but no drop in BP. He has LE edema on exam but reports this is much improved from previous. He has some left sided weakness he reports is known. He also reports some sores on the inside of his mouth.   Past Medical History:  Diagnosis Date   Autoimmune disease (HCC)    Bronchitis    Chronic HFrEF (heart failure with reduced ejection fraction) (HCC)    normalized EF   Chronic hiccups    Chronic kidney disease, stage 3a (HCC)    DM2 (diabetes mellitus, type 2) (HCC) 09/28/2014   DVT (deep venous thrombosis) (HCC)    GERD (gastroesophageal reflux disease)    Hypogonadism in male 04/01/2018   Hypothyroidism    Joint pain    Necrotizing myopathy    Nonobstructive atherosclerosis of coronary artery    Posttraumatic stress disorder 01/30/2022   PTSD (post-traumatic stress disorder)    Pulmonary embolism (HCC)    Rheumatoid arthritis (HCC) 08/09/2021   RSV (respiratory syncytial virus pneumonia) 03/11/2022    Past Surgical History:  Procedure Laterality Date   BIOPSY SHOULDER     Left   IR ANGIOGRAM PULMONARY BILATERAL SELECTIVE  07/22/2021   IR THROMBECT VENO MECH MOD SED   07/22/2021   IR US GUIDE VASC ACCESS LEFT  07/22/2021   RADIOLOGY WITH ANESTHESIA Right 07/22/2021   Procedure: IR WITH ANESTHESIA;  Surgeon: Radiologist, Medication, MD;  Location: [facility] OR;  Service: Radiology;  Laterality: Right;   UPPER LEG SOFT TISSUE BIOPSY Right      Home Medications:  Prior to Admission medications   Medication Sig Start Date End Date Taking? Authorizing Provider  acarbose (PRECOSE) 100 MG tablet Take 100 mg by mouth 3 (three) times daily with meals.   Yes [provider]  acetaminophen (TYLENOL) 500 MG tablet Take 500-1,000 mg by mouth See admin instructions. Take 1000 mg by mouth in the morning and 500 mg at bedtime   Yes [provider]  apixaban (ELIQUIS) 5 MG TABS tablet Take 1 tablet (5 mg total) by mouth 2 (two) times daily. 08/09/22  Yes Alver Sorrow, NP  baclofen (LIORESAL) 20 MG tablet TAKE 1 TABLET BY MOUTH TWICE A DAY 01/30/22  Yes Everrett Coombe, DO  calcium carbonate (OS-CAL) 600 MG TABS tablet Take 600 mg  by mouth daily.   Yes [provider]  Calcium Carbonate Antacid (TUMS PO) Take 3 tablets by mouth 2 (two) times daily as needed (heartburn).   Yes [provider]  carvedilol (COREG) 6.25 MG tablet Take 1 tablet (6.25 mg total) by mouth 2 (two) times daily. 03/15/22  Yes Elgergawy, Leana Roe, MD  folic acid (FOLVITE) 1 MG tablet Take 2 mg by mouth daily.   Yes [provider]  insulin glargine (LANTUS) 100 UNIT/ML injection Inject 0.24 mLs (24 Units total) into the skin daily. Patient taking differently: Inject 38 Units into the skin daily. 03/24/22  Yes Danford, Earl Lites, MD  latanoprost (XALATAN) 0.005 % ophthalmic solution 1 drop at bedtime. 08/17/22  Yes [provider]  Levothyroxine Sodium 75 MCG CAPS Take 75 mcg by mouth daily.   Yes [provider]  loratadine (CLARITIN) 10 MG tablet Take 10 mg by mouth daily. 12/12/21  Yes [provider]  methotrexate (RHEUMATREX) 5 MG  tablet Take 3 tablets by mouth once a week. Caution: Chemotherapy. Protect from light.   Yes [provider]  pantoprazole (PROTONIX) 40 MG tablet Take 1 tablet (40 mg total) by mouth as needed. Patient taking differently: Take 40 mg by mouth daily. 01/09/22  Yes Alver Sorrow, NP  polyvinyl alcohol (LIQUIFILM TEARS) 1.4 % ophthalmic solution Place 1 drop into both eyes in the morning and at bedtime.   Yes [provider]  predniSONE (DELTASONE) 10 MG tablet Take 10 mg by mouth daily with breakfast. Take with 5 mg tablet to equal 15 mg   Yes [provider]  predniSONE (DELTASONE) 5 MG tablet Take 5 mg by mouth daily with breakfast. Take with 10 mg to make a total of 15 mg   Yes [provider]  sertraline (ZOLOFT) 25 MG tablet Take 25mg  (one tablet) daily for one week then increase to 50mg  daily. Patient taking differently: Take 50 mg by mouth daily. Take 50mg  daily. 04/06/22  Yes Alver Sorrow, NP  torsemide (DEMADEX) 20 MG tablet Take 1 tablet (20 mg total) by mouth daily. May take additional tablet as directed by lymphedema clinic or cardiologist. 08/09/22 05/06/23 Yes Alver Sorrow, NP  AMBULATORY NON FORMULARY MEDICATION Please provide 3 in 1 bedside commode.  Dx:R29.6, M62.81 Patient not taking: Reported on 09/13/2022 04/16/22   Everrett Coombe, DO  AMBULATORY NON FORMULARY MEDICATION Please provide bilateral thigh high compression stockings: Graded compression of 20-40mmhg Patient not taking: Reported on 09/13/2022 09/05/22   Everrett Coombe, DO  ipratropium-albuterol (DUONEB) 0.5-2.5 (3) MG/3ML SOLN Use 1 vial (3mL) by nebulization 4 (four) times daily. Patient not taking: Reported on 09/13/2022 03/15/22   Elgergawy, Leana Roe, MD  PRESCRIPTION MEDICATION Place 1 drop into both eyes in the morning and at bedtime. Th[facility] Eye Medicine -- Unknown Name -- Unknown Dosing Patient not taking: Reported on 09/13/2022    [provider]  testosterone  cypionate (DEPOTESTOSTERONE CYPIONATE) 200 MG/ML injection Inject 0.5 mLs (100 mg total) into the muscle every 7 (seven) days. Patient not taking: Reported on 09/13/2022 09/05/22   Everrett Coombe, DO    Inpatient Medications: Scheduled Meds:  acetaminophen  1,000 mg Oral Daily   acetaminophen  500 mg Oral QHS   apixaban  5 mg Oral BID   calcium carbonate  1,250 mg Oral Q breakfast   carvedilol  6.25 mg Oral BID WC   folic acid  2 mg Oral Daily   furosemide  40 mg Intravenous  Once   insulin aspart  0-5 Units Subcutaneous QHS   insulin aspart  0-9 Units Subcutaneous TID WC   insulin detemir  10 Units Subcutaneous QHS   latanoprost  1 drop Both Eyes QHS   levothyroxine  75 mcg Oral Daily   pantoprazole  40 mg Oral Daily   predniSONE  15 mg Oral Q breakfast   sertraline  50 mg Oral Daily   sodium chloride flush  3 mL Intravenous Q12H   torsemide  20 mg Oral Daily   Continuous Infusions:  PRN Meds: acetaminophen **OR** acetaminophen, ondansetron **OR** ondansetron (ZOFRAN) IV, perflutren lipid microspheres (DEFINITY) IV suspension, polyvinyl alcohol  Allergies:    Allergies  Allergen Reactions   Benazepril Hives and Cough   Gabapentin Swelling   Hydrocodone Hives and Itching   Shellfish Allergy Diarrhea and Nausea And Vomiting    Social History:   Social History   Socioeconomic History   Marital status: Married    Spouse name: Kim   Number of children: 4   Years of education: 16   Highest education level: Master's degree (e.g., MA, MS, MEng, MEd, MSW, MBA)  Occupational History   Occupation: Retired  Tobacco Use   Smoking status: Never   Smokeless tobacco: Never  Vaping Use   Vaping Use: Never used  Substance and Sexual Activity   Alcohol use: No   Drug use: No   Sexual activity: Not on file  Other Topics Concern   Not on file  Social History Narrative   Lives at home with his wife. He enjoys playing sodoku, playing cards and reading.   Social Determinants of  Health   Financial Resource Strain: Low Risk  (08/14/2022)   Overall Financial Resource Strain (CARDIA)    Difficulty of Paying Living Expenses: Not hard at all  Food Insecurity: No Food Insecurity (08/14/2022)   Hunger Vital Sign    Worried About Running Out of Food in the Last Year: Never true    Ran Out of Food in the Last Year: Never true  Transportation Needs: No Transportation Needs (08/14/2022)   PRAPARE - Administrator, Civil Service (Medical): No    Lack of Transportation (Non-Medical): No  Physical Activity: Insufficiently Active (08/14/2022)   Exercise Vital Sign    Days of Exercise per Week: 3 days    Minutes of Exercise per Session: 20 min  Stress: No Stress Concern Present (08/14/2022)   [employer] of Occupational Health - Occupational Stress Questionnaire    Feeling of Stress : Not at all  Social Connections: Socially Isolated (08/14/2022)   Social Connection and Isolation Panel [NHANES]    Frequency of Communication with Friends and Family: Once a week    Frequency of Social Gatherings with Friends and Family: Never    Attends Religious Services: Never    Database administrator or Organizations: No    Attends Banker Meetings: Never    Marital Status: Married  Catering manager Violence: Not At Risk (08/14/2022)   Humiliation, Afraid, Rape, and Kick questionnaire    Fear of Current or Ex-Partner: No    Emotionally Abused: No    Physically Abused: No    Sexually Abused: No    Family History:   Family History  Problem Relation Age of Onset   Hypertension Mother    Diabetes Mother    Arthritis Mother    Depression Mother    Heart attack Father    Hypertension Father  ROS:  Please see the history of present illness.  All other ROS reviewed and negative.     Physical Exam/Data:   Vitals:   09/12/22 2106 09/12/22 2328 09/13/22 0721 09/13/22 1112  BP: 121/71 (!) 145/79 (!) 141/71 121/71  Pulse: 74 71 69 73  Resp: 19 18 (!)  21 14  Temp:  97.6 F (36.4 C) 97.6 F (36.4 C) 97.7 F (36.5 C)  TempSrc:  Oral Oral Oral  SpO2: 100% 96%    Weight:      Height:        Intake/Output Summary (Last 24 hours) at 09/13/2022 1128 Last data filed at 09/13/2022 1120 Gross per 24 hour  Intake 360 ml  Output 1450 ml  Net -1090 ml      09/12/2022    6:44 PM 09/12/2022    2:33 PM 09/12/2022    1:25 PM  Last 3 Weights  Weight (lbs) 219 lb 12.8 oz 210 lb 210 lb  Weight (kg) 99.701 kg 95.255 kg 95.255 kg     Body mass index is 33.42 kg/m.  General: Well developed, well nourished, in no acute distress. Head: Normocephalic, atraumatic, sclera non-icteric, no xanthomas, nares are without discharge. Neck: Negative for carotid bruits. JVP not elevated. Lungs: Clear bilaterally to auscultation without wheezes, rales, or rhonchi. Breathing is unlabored. Heart: RRR S1 S2 without murmurs, rubs, or gallops.  Abdomen: Soft, non-tender, non-distended with normoactive bowel sounds. No rebound/guarding. Extremities: No clubbing or cyanosis. 1+ BLE edema. Distal pedal pulses are 2+ and equal bilaterally. Neuro: Alert and oriented X 3. Moves all extremities spontaneously. Left UE, LE strength decreased on neuro exam. Psych:  Responds to questions appropriately with a normal affect.   EKG:  The EKG was personally reviewed and demonstrates:  NSR 80bpm, baseline tremulous artifact, no acute STT changes   Telemetry:  Telemetry was personally reviewed and demonstrates:  NSR, artifact, no arrhythmias  Relevant CV Studies: 2d echo 02/2022   1. Left ventricular ejection fraction, by estimation, is 55 to 60%. The  left ventricle has normal function. The left ventricle demonstrates  regional wall motion abnormalities (see scoring diagram/findings for  description). Left ventricular diastolic  parameters are consistent with Grade I diastolic dysfunction (impaired  relaxation).   2. Right ventricular systolic function is normal. The right  ventricular  size is normal.   3. The mitral valve is normal in structure. No evidence of mitral valve  regurgitation. No evidence of mitral stenosis.   4. The aortic valve is tricuspid. There is mild calcification of the  aortic valve. There is mild thickening of the aortic valve. Aortic valve  regurgitation is not visualized. Aortic valve sclerosis is present, with  no evidence of aortic valve stenosis.   5. Aortic dilatation noted. There is mild dilatation of the aortic root,  measuring 41 mm.   6. The inferior vena cava is normal in size with greater than 50%  respiratory variability, suggesting right atrial pressure of 3 mmHg.   Comparison(s): Prior images unable to be directly viewed, comparison made  by report only. The left ventricular function has improved.    Laboratory Data:  High Sensitivity Troponin:   Recent Labs  Lab 08/26/22 2224 09/12/22 1424 09/12/22 1527  TROPONINIHS 16 15 15      Chemistry Recent Labs  Lab 09/12/22 1424 09/13/22 0023  NA 135 139  K 4.5 4.0  CL 100 104  CO2 25 25  GLUCOSE 284* 290*  BUN 23 17  CREATININE 1.27*  1.22  CALCIUM 9.2 9.7  GFRNONAA 60* >60  ANIONGAP 10 10    Recent Labs  Lab 09/12/22 1424 09/13/22 0023  PROT 6.4* 5.8*  ALBUMIN 3.9 3.7  AST 24 20  ALT 21 21  ALKPHOS 49 45  BILITOT 0.6 0.6   Lipids No results for input(s): "CHOL", "TRIG", "HDL", "LABVLDL", "LDLCALC", "CHOLHDL" in the last 168 hours.  Hematology Recent Labs  Lab 09/12/22 1424  WBC 6.4  RBC 2.98*  HGB 9.5*  HCT 28.8*  MCV 96.6  MCH 31.9  MCHC 33.0  RDW 15.1  PLT 242   Thyroid No results for input(s): "TSH", "FREET4" in the last 168 hours.  BNPNo results for input(s): "BNP", "PROBNP" in the last 168 hours.  DDimer No results for input(s): "DDIMER" in the last 168 hours.   Radiology/Studies:  ECHOCARDIOGRAM COMPLETE  Result Date: 09/13/2022    ECHOCARDIOGRAM REPORT   Patient Name:   [name] Date of Exam: 09/13/2022 Medical Rec #:   [id number]   Height:       68.0 in Accession #:    [id number]  Weight:       219.8 lb Date of Birth:  06/29/1950    BSA:          2.127 m Patient Age:    71 years    BP:           102/77 mmHg Patient Gender: M           HR:           73 bpm. Exam Location:  Inpatient Procedure: 2D Echo, Cardiac Doppler, Color Doppler and Intracardiac            Opacification Agent Indications:    CHF- Acute diastolic  History:        Patient has prior history of Echocardiogram examinations, most                 recent 03/14/2022. CHF, Previous Myocardial Infarction; Risk                 Factors:Diabetes. Hx of DVT, PE, CKD.  Sonographer:    Milda Smart Referring Phys: [id number] [facility]  Sonographer Comments: Technically difficult study due to poor echo windows. Image acquisition challenging due to patient body habitus and Image acquisition challenging due to respiratory motion. IMPRESSIONS  1. Left ventricular ejection fraction, by estimation, is 60 to 65%. The left ventricle has normal function. The left ventricle has no regional wall motion abnormalities. There is mild concentric left ventricular hypertrophy. Left ventricular diastolic parameters are indeterminate. Elevated left ventricular end-diastolic pressure.  2. Right ventricular systolic function was not well visualized. The right ventricular size is normal.  3. The mitral valve is normal in structure. No evidence of mitral valve regurgitation. No evidence of mitral stenosis.  4. The aortic valve is tricuspid. Aortic valve regurgitation is trivial. Aortic valve sclerosis/calcification is present, without any evidence of aortic stenosis.  5. The inferior vena cava is normal in size with greater than 50% respiratory variability, suggesting right atrial pressure of 3 mmHg.  6. Ascending aorta measurements are within normal limits for age when indexed to body surface area. FINDINGS  Left Ventricle: Left ventricular ejection fraction, by estimation, is 60 to 65%. The left  ventricle has normal function. The left ventricle has no regional wall motion abnormalities. Definity contrast agent was given IV to delineate the left ventricular  endocardial borders. The left ventricular internal cavity size was normal in  size. There is mild concentric left ventricular hypertrophy. Left ventricular diastolic parameters are indeterminate. Elevated left ventricular end-diastolic pressure. Right Ventricle: The right ventricular size is normal. No increase in right ventricular wall thickness. Right ventricular systolic function was not well visualized. Left Atrium: Left atrial size was normal in size. Right Atrium: Right atrial size was normal in size. Pericardium: There is no evidence of pericardial effusion. Mitral Valve: The mitral valve is normal in structure. No evidence of mitral valve regurgitation. No evidence of mitral valve stenosis. MV peak gradient, 4.8 mmHg. The mean mitral valve gradient is 2.0 mmHg. Tricuspid Valve: The tricuspid valve is normal in structure. Tricuspid valve regurgitation is trivial. No evidence of tricuspid stenosis. Aortic Valve: The aortic valve is tricuspid. Aortic valve regurgitation is trivial. Aortic valve sclerosis/calcification is present, without any evidence of aortic stenosis. Pulmonic Valve: The pulmonic valve was normal in structure. Pulmonic valve regurgitation is mild. No evidence of pulmonic stenosis. Aorta: The aortic root is normal in size and structure. Ascending aorta measurements are within normal limits for age when indexed to body surface area. Venous: The inferior vena cava is normal in size with greater than 50% respiratory variability, suggesting right atrial pressure of 3 mmHg. IAS/Shunts: No atrial level shunt detected by color flow Doppler.  LEFT VENTRICLE PLAX 2D LVIDd:         4.00 cm     Diastology LVIDs:         3.10 cm     LV e' medial:    4.21 cm/s LV PW:         1.20 cm     LV E/e' medial:  19.7 LV IVS:        1.10 cm     LV e'  lateral:   5.70 cm/s LVOT diam:     2.30 cm     LV E/e' lateral: 14.6 LV SV:         94 LV SV Index:   44 LVOT Area:     4.15 cm  LV Volumes (MOD) LV vol d, MOD A2C: 71.2 ml LV vol d, MOD A4C: 76.3 ml LV vol s, MOD A2C: 33.1 ml LV vol s, MOD A4C: 31.6 ml LV SV MOD A2C:     38.1 ml LV SV MOD A4C:     76.3 ml LV SV MOD BP:      40.7 ml RIGHT VENTRICLE            IVC RV S prime:     9.99 cm/s  IVC diam: 1.60 cm TAPSE (M-mode): 1.4 cm LEFT ATRIUM             Index        RIGHT ATRIUM           Index LA diam:        3.40 cm 1.60 cm/m   RA Area:     11.80 cm LA Vol (A2C):   49.9 ml 23.46 ml/m  RA Volume:   23.90 ml  11.23 ml/m LA Vol (A4C):   41.5 ml 19.51 ml/m LA Biplane Vol: 46.4 ml 21.81 ml/m  AORTIC VALVE LVOT Vmax:   97.30 cm/s LVOT Vmean:  74.900 cm/s LVOT VTI:    0.226 m  AORTA Ao Root diam: 3.80 cm Ao Asc diam:  4.00 cm MITRAL VALVE MV Area (PHT): 3.53 cm    SHUNTS MV Area VTI:   3.04 cm    Systemic VTI:  0.23 m MV Peak grad:  4.8 mmHg    Systemic Diam: 2.30 cm MV Mean grad:  2.0 mmHg MV Vmax:       1.09 m/s MV Vmean:      61.5 cm/s MV Decel Time: 215 msec MV E velocity: 83.10 cm/s MV A velocity: 90.80 cm/s MV E/A ratio:  0.92 Armanda Magic MD Electronically signed by Armanda Magic MD Signature Date/Time: 09/13/2022/11:26:16 AM    Final    EEG adult  Result Date: 09/13/2022 Charlsie Quest, MD     09/13/2022 10:04 AM Patient Name: [name] MRN: [id number] Epilepsy Attending: Charlsie Quest Referring Physician/Provider: Buena Irish, MD Date: 09/13/2022 Duration: 22.28 mins Patient history: 72yo M with ams getting eeg to evaluate for seizure Level of alertness: Awake,asleep AEDs during EEG study: None Technical aspects: This EEG study was done with scalp electrodes positioned according to the 10-20 International system of electrode placement. Electrical activity was reviewed with band pass filter of 1-70Hz , sensitivity of 7 uV/mm, display speed of 68mm/sec with a 60Hz  notched filter applied as  appropriate. EEG data were recorded continuously and digitally stored.  Video monitoring was available and reviewed as appropriate. Description: The posterior dominant rhythm consists of 9-10 Hz activity of moderate voltage (25-35 uV) seen predominantly in posterior head regions, symmetric and reactive to eye opening and eye closing. Sleep was characterized by vertex waves, sleep spindles (12 to 14 Hz), maximal frontocentral region. Hyperventilation and photic stimulation were not performed.   IMPRESSION: This study is within normal limits. No seizures or epileptiform discharges were seen throughout the recording. A normal interictal EEG does not exclude the diagnosis of epilepsy. Charlsie Quest   MR BRAIN WO CONTRAST  Result Date: 09/13/2022 CLINICAL DATA:  Initial evaluation for seizure. EXAM: MRI HEAD WITHOUT CONTRAST TECHNIQUE: Multiplanar, multiecho pulse sequences of the brain and surrounding structures were obtained without intravenous contrast. COMPARISON:  Prior CT from earlier the same day. FINDINGS: Brain: Mild cerebral and cerebellar atrophy. Patchy T2/FLAIR hyperintensity involving the periventricular white matter, consistent with chronic small vessel ischemic disease, mild in nature. No evidence for acute or subacute ischemia. No parenchymal changes of status epilepticus are seen. No areas of chronic cortical infarction or other insult. No acute or chronic intracranial blood products. No mass lesion, midline shift or mass effect. Mild ventricular prominence related to global parenchymal volume loss of hydrocephalus. No extra-axial fluid collection. Pituitary gland and suprasellar region within normal limits. No intrinsic temporal lobe abnormality. Vascular: Major intracranial vascular flow voids are maintained. Skull and upper cervical spine: Craniocervical junction within normal limits. Bone marrow signal intensity within normal limits. No scalp soft tissue abnormality. Sinuses/Orbits: Globes  and orbital soft tissues within normal limits. Scattered mucosal thickening noted about the frontoethmoidal and maxillary sinuses. No mastoid effusion. Other: None. IMPRESSION: 1. No acute intracranial abnormality. 2. Mild cerebral and cerebellar atrophy with chronic small vessel ischemic disease. Electronically Signed   By: Rise Mu M.D.   On: 09/13/2022 01:17   DG Chest Portable 1 View  Result Date: 09/12/2022 CLINICAL DATA:  Loss of consciousness EXAM: PORTABLE CHEST 1 VIEW COMPARISON:  08/26/2022 x-ray and older FINDINGS: Stable enlarged cardiopericardial silhouette. Improving left basilar atelectasis. No consolidation, pneumothorax, effusion or edema. Overlapping cardiac leads. Degenerative changes seen of the shoulders. IMPRESSION: Improving left basilar atelectasis. Electronically Signed   By: Karen Kays M.D.   On: 09/12/2022 14:36   CT Head Wo Contrast  Result Date: 09/12/2022 CLINICAL DATA:  Mental status change, unknown cause EXAM: CT HEAD  WITHOUT CONTRAST TECHNIQUE: Contiguous axial images were obtained from the base of the skull through the vertex without intravenous contrast. RADIATION DOSE REDUCTION: This exam was performed according to the departmental dose-optimization program which includes automated exposure control, adjustment of the mA and/or kV according to patient size and/or use of iterative reconstruction technique. COMPARISON:  08/26/2022 FINDINGS: Brain: No evidence of acute infarction, hemorrhage, hydrocephalus, extra-axial collection or mass lesion/mass effect. Scattered low-density changes within the periventricular and subcortical white matter most compatible with chronic microvascular ischemic change. Mild diffuse cerebral volume loss. Vascular: Atherosclerotic calcifications involving the large vessels of the skull base. No unexpected hyperdense vessel. Skull: Normal. Negative for fracture or focal lesion. Sinuses/Orbits: Similar degree of right-sided paranasal  sinus mucosal thickening. Other: None. IMPRESSION: 1. No acute intracranial findings. 2. Chronic microvascular ischemic change and cerebral volume loss. Electronically Signed   By: Duanne Guess D.O.   On: 09/12/2022 14:19     Assessment and Plan:   1. Loss of consciousness - ?seizures vs syncope - superimposed on a history of waxing/waning AMS, fatigue, leg weakness, falls which cannot be clearly explained by his cardiac findings, though we cannot exclude syncope in the differential for this particular episode given the description of events - consider event monitor at discharge to exclude arrhythmia - telemetry, EKG stable here - echo shows continued normal LVEF with mild LVH, elevated LVEDP, aortic sclerosis without stenosis.  - neurology evaluation under way - orthostatics demonstrated slight rise in HR (82 sitting, 101 standing) but no drop in BP  2. Chronic HFrEF with chronic lymphedema  - follows with lymphedema clinic - GDMT includes carvedilol, torsemide - has had issues with dehydration in the past - Entresto previously stopped due to hypotension - spironolactone stopped 05/2022 due to patient message stating nephrology did not want him to take - has significant edema on exam but patient reports this is actually significantly better than normal - will discuss regimen with MD  3. Mild CAD by cor CT 08/2021 - previous troponin elevation 06/2021 felt due to saddle PE, no obstructive coronary disease present, negative this admission - not on ASA given mild disease and concomitant Eliquis - continue carvedilol - per OP cardiology APP notes, statin previously stopped by outpatient urology provider due to drug interaction concern, details not known at this time, can re-explore as outpatient  4. H/o PE/DVT - on Eliquis - per prior notes, testosterone supplementation was felt to be contributing to presentation; recently resumed by PCP 08/2022, will defer to medicine team regarding  continuation  5. Anemia since 2023 - further per primary team  6. CKD 3a - appears near baseline  7. OSA - will need ongoing OP f/u for this   Risk Assessment/Risk Scores:        New York Heart Association (NYHA) Functional Class NYHA Class II-III   For questions or updates, please contact [facility] Please consult [email or web address] for contact info under    Signed, Laurann Montana, PA-C  09/13/2022 11:28 AM

## 2022-09-13 NOTE — Progress Notes (Signed)
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  He is requesting resumption of his home baclofen, which she takes on a scheduled twice daily basis.  I subsequently resumed home scheduled twice daily baclofen and also ordered prn IV fentanyl for residual generalized pain.   [name] Pigg, DO Hospitalist

## 2022-09-13 NOTE — Discharge Instructions (Signed)

## 2022-09-13 NOTE — Progress Notes (Signed)
Please notify cardiology when patient is being discharged so we can arrange monitor and f/u.

## 2022-09-13 NOTE — Progress Notes (Signed)
  Echocardiogram 2D Echocardiogram has been performed.  [name] 09/13/2022, 11:11 AM

## 2022-09-13 NOTE — Inpatient Diabetes Management (Signed)
Inpatient Diabetes Program Recommendations  AACE/ADA: New Consensus Statement on Inpatient Glycemic Control   Target Ranges:  Prepandial:   less than 140 mg/dL      Peak postprandial:   less than 180 mg/dL (1-2 hours)      Critically ill patients:  140 - 180 mg/dL    Latest Reference Range & Units 09/12/22 13:22 09/12/22 18:53 09/13/22 00:13 09/13/22 06:24  Glucose-Capillary 70 - 99 mg/dL 295 (H) 621 (H) 308 (H) 153 (H)    Review of Glycemic Control  Diabetes history: DM2 Outpatient Diabetes medications: Lantus 24 units daily, Precose 100 mg TID; Prednisone 15 mg daily Current orders for Inpatient glycemic control: Levemir 30 units QHS, Novolog 0-9 units TID with meals, Novolog 0-5 units QHS, Precose 100 mg TID; Prednisone 15 mg QAM  Inpatient Diabetes Program Recommendations:    Insulin: No basal insulin given last night and fasting CBG 153 mg/dl today. Please consider discontinuing Levemir and ordering Semglee 10 units QHS and discontinue Precose while inpatient.  Thanks, Orlando Penner, RN, MSN, CDCES Diabetes Coordinator Inpatient Diabetes Program [phone] (Team Pager from 8am to 5pm)

## 2022-09-13 NOTE — Plan of Care (Signed)
  Problem: Education: Goal: Knowledge of General Education information will improve Description: Including pain rating scale, medication(s)/side effects and non-pharmacologic comfort measures Outcome: Progressing   Problem: Health Behavior/Discharge Planning: Goal: Ability to manage health-related needs will improve Outcome: Progressing   Problem: Clinical Measurements: Goal: Ability to maintain clinical measurements within normal limits will improve Outcome: Progressing Goal: Will remain free from infection Outcome: Progressing Goal: Diagnostic test results will improve Outcome: Progressing Goal: Respiratory complications will improve Outcome: Progressing Goal: Cardiovascular complication will be avoided Outcome: Progressing   Problem: Activity: Goal: Risk for activity intolerance will decrease Outcome: Progressing   Problem: Nutrition: Goal: Adequate nutrition will be maintained Outcome: Progressing   Problem: Coping: Goal: Level of anxiety will decrease Outcome: Progressing   Problem: Elimination: Goal: Will not experience complications related to bowel motility Outcome: Progressing Goal: Will not experience complications related to urinary retention Outcome: Progressing   Problem: Pain Managment: Goal: General experience of comfort will improve Outcome: Progressing   Problem: Safety: Goal: Ability to remain free from injury will improve Outcome: Progressing   Problem: Skin Integrity: Goal: Risk for impaired skin integrity will decrease Outcome: Progressing   Problem: Education: Goal: Knowledge of condition and prescribed therapy will improve Outcome: Progressing   Problem: Cardiac: Goal: Will achieve and/or maintain adequate cardiac output Outcome: Progressing   Problem: Physical Regulation: Goal: Complications related to the disease process, condition or treatment will be avoided or minimized Outcome: Progressing   Problem: Education: Goal: Ability  to describe self-care measures that may prevent or decrease complications (Diabetes Survival Skills Education) will improve Outcome: Progressing Goal: Individualized Educational Video(s) Outcome: Progressing   Problem: Coping: Goal: Ability to adjust to condition or change in health will improve Outcome: Progressing   Problem: Fluid Volume: Goal: Ability to maintain a balanced intake and output will improve Outcome: Progressing   Problem: Health Behavior/Discharge Planning: Goal: Ability to identify and utilize available resources and services will improve Outcome: Progressing Goal: Ability to manage health-related needs will improve Outcome: Progressing   Problem: Metabolic: Goal: Ability to maintain appropriate glucose levels will improve Outcome: Progressing   Problem: Nutritional: Goal: Maintenance of adequate nutrition will improve Outcome: Progressing Goal: Progress toward achieving an optimal weight will improve Outcome: Progressing   Problem: Skin Integrity: Goal: Risk for impaired skin integrity will decrease Outcome: Progressing   Problem: Tissue Perfusion: Goal: Adequacy of tissue perfusion will improve Outcome: Progressing

## 2022-09-13 NOTE — Consult Note (Signed)
Hello neurology Consultation Reason for Consult: seizure like activity Referring Physician: Dr Glade Lloyd  CC: seizure like activity  History is obtained from: patient , chart review  HPI: [name] is a 72 y.o. 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  Patient does not remember the episode.  Therefore history obtained from wife.  Wife states he wanted to go use the bathroom and asked for help.  While going to the bathroom he then suddenly started staring out/looking towards the ceiling, not responding, appears stated, no jerking, incontinence.  This lasted for few seconds followed by return to baseline.  Wife states he has had a similar episode on April 28.  At that time they came to the ER and he was diagnosed with dehydration and eventually sent home.  She states she had a similar episode about a year ago.  Denies any recent medication changes, family history of epilepsy, recent illnesses.  Patient reports significant weakness in left upper and lower extremity.  From review of chart, he has had gradually progressive weakness in both upper and lower extremities but I do not documentation of weakness left more than right side.    Patient also reports issues with short-term memory for at least a few years.  Denies any formal diagnosis of dementia.  ROS: All other systems reviewed and negative except as noted in the HPI.   Past Medical History:  Diagnosis Date   Autoimmune disease (HCC)    Bronchitis    Chronic hiccups    Diabetes mellitus without complication (HCC)    DM2 (diabetes mellitus, type 2) (HCC) 09/28/2014   DVT (deep venous thrombosis) (HCC)    GERD (gastroesophageal reflux disease)    Hypogonadism in male 04/01/2018   Hypothyroidism    Joint pain     Necrotizing myopathy    NSTEMI (non-ST elevated myocardial infarction) (HCC)    Posttraumatic stress disorder 01/30/2022   PTSD (post-traumatic stress disorder)    Pulmonary embolism (HCC)    Rheumatoid arthritis (HCC) 08/09/2021   RSV (respiratory syncytial virus pneumonia) 03/11/2022    Family History  Problem Relation Age of Onset   Hypertension Mother    Diabetes Mother    Arthritis Mother    Depression Mother    Heart attack Father    Hypertension Father     Social History:  reports that he has never smoked. He has never used smokeless tobacco. He reports that he does not drink alcohol and does not use drugs.   Medications Prior to Admission  Medication Sig Dispense Refill Last Dose   acarbose (PRECOSE) 100 MG tablet Take 100 mg by mouth 3 (three) times daily with meals.   09/12/2022   acetaminophen (TYLENOL) 500 MG tablet Take 500-1,000 mg by mouth See admin instructions. Take 1000 mg by mouth in the morning and 500 mg at bedtime   09/12/2022   apixaban (ELIQUIS) 5 MG TABS tablet Take 1 tablet (5 mg total) by mouth 2 (two) times daily. 60 tablet 11 09/12/2022 at 1000   baclofen (LIORESAL) 20 MG tablet TAKE 1 TABLET BY MOUTH TWICE A DAY 180 tablet 1 09/12/2022   calcium carbonate (OS-CAL) 600 MG TABS tablet Take 600 mg by mouth daily.   Past Month   Calcium Carbonate Antacid (TUMS PO) Take 3 tablets by mouth 2 (two) times  daily as needed (heartburn).   09/12/2022   carvedilol (COREG) 6.25 MG tablet Take 1 tablet (6.25 mg total) by mouth 2 (two) times daily. 60 tablet 0 09/12/2022 at 1000   folic acid (FOLVITE) 1 MG tablet Take 2 mg by mouth daily.   Past Week   insulin glargine (LANTUS) 100 UNIT/ML injection Inject 0.24 mLs (24 Units total) into the skin daily. (Patient taking differently: Inject 38 Units into the skin daily.) 10 mL 11 09/12/2022   latanoprost (XALATAN) 0.005 % ophthalmic solution 1 drop at bedtime.   09/12/2022   Levothyroxine Sodium 75 MCG CAPS Take 75 mcg by mouth  daily.   09/12/2022   loratadine (CLARITIN) 10 MG tablet Take 10 mg by mouth daily.   Past Week   methotrexate (RHEUMATREX) 5 MG tablet Take 3 tablets by mouth once a week. Caution: Chemotherapy. Protect from light.   Past Month   pantoprazole (PROTONIX) 40 MG tablet Take 1 tablet (40 mg total) by mouth as needed. (Patient taking differently: Take 40 mg by mouth daily.) 30 tablet 3 09/12/2022   polyvinyl alcohol (LIQUIFILM TEARS) 1.4 % ophthalmic solution Place 1 drop into both eyes in the morning and at bedtime.   09/12/2022   predniSONE (DELTASONE) 10 MG tablet Take 10 mg by mouth daily with breakfast. Take with 5 mg tablet to equal 15 mg   09/12/2022   predniSONE (DELTASONE) 5 MG tablet Take 5 mg by mouth daily with breakfast. Take with 10 mg to make a total of 15 mg   09/12/2022   sertraline (ZOLOFT) 25 MG tablet Take 25mg  (one tablet) daily for one week then increase to 50mg  daily. (Patient taking differently: Take 50 mg by mouth daily. Take 50mg  daily.) 7 tablet 0 09/12/2022   torsemide (DEMADEX) 20 MG tablet Take 1 tablet (20 mg total) by mouth daily. May take additional tablet as directed by lymphedema clinic or cardiologist. 135 tablet 1 09/12/2022   AMBULATORY NON FORMULARY MEDICATION Please provide 3 in 1 bedside commode.  Dx:R29.6, M62.81 (Patient not taking: Reported on 09/13/2022) 1 Device 0 Not Taking   AMBULATORY NON FORMULARY MEDICATION Please provide bilateral thigh high compression stockings: Graded compression of 20-70mmhg (Patient not taking: Reported on 09/13/2022) 3 Package 0 Not Taking   ipratropium-albuterol (DUONEB) 0.5-2.5 (3) MG/3ML SOLN Use 1 vial (3mL) by nebulization 4 (four) times daily. (Patient not taking: Reported on 09/13/2022) 360 mL 0 Not Taking   PRESCRIPTION MEDICATION Place 1 drop into both eyes in the morning and at bedtime. [facility] -- Unknown Name -- Unknown Dosing (Patient not taking: Reported on 09/13/2022)   Not Taking   testosterone cypionate  (DEPOTESTOSTERONE CYPIONATE) 200 MG/ML injection Inject 0.5 mLs (100 mg total) into the muscle every 7 (seven) days. (Patient not taking: Reported on 09/13/2022) 10 mL 1 Not Taking      Exam: Current vital signs: BP (!) 141/71 (BP Location: Right Arm)   Pulse 69   Temp 97.6 F (36.4 C) (Oral)   Resp (!) 21   Ht 5\' 8"  (1.727 m)   Wt 99.7 kg   SpO2 96%   BMI 33.42 kg/m  Vital signs in last 24 hours: Temp:  [97.6 F (36.4 C)-98 F (36.7 C)] 97.6 F (36.4 C) (05/16 0721) Pulse Rate:  [63-85] 69 (05/16 0721) Resp:  [18-23] 21 (05/16 0721) BP: (121-153)/(71-91) 141/71 (05/16 0721) SpO2:  [96 %-100 %] 96 % (05/15 2328) Weight:  [95.3 kg-99.7 kg] 99.7 kg (05/15 1844)  Physical Exam  Constitutional: Appears well-developed and well-nourished.  Neuro: AOx3, no aphasia, cranial nerves II to XII grossly intact, 4/5 Hz in bilateral upper extremities with subtle weakness in left upper extremity, 3/5 in left lower extremity, 2/5 in right lower extremity  I have reviewed labs in epic and the results pertinent to this consultation are: CBC:  Recent Labs  Lab 09/12/22 1424  WBC 6.4  NEUTROABS 5.0  HGB 9.5*  HCT 28.8*  MCV 96.6  PLT 242    Basic Metabolic Panel:  Lab Results  Component Value Date   NA 139 09/13/2022   K 4.0 09/13/2022   CO2 25 09/13/2022   GLUCOSE 290 (H) 09/13/2022   BUN 17 09/13/2022   CREATININE 1.22 09/13/2022   CALCIUM 9.7 09/13/2022   GFRNONAA >60 09/13/2022   GFRAA 62 09/22/2019   Lipid Panel:  Lab Results  Component Value Date   LDLCALC 115 (H) 07/24/2021   HgbA1c:  Lab Results  Component Value Date   HGBA1C 9.9 (H) 09/13/2022   Urine Drug Screen:     Component Value Date/Time   LABOPIA NONE DETECTED 03/11/2022 1107   COCAINSCRNUR NONE DETECTED 03/11/2022 1107   LABBENZ NONE DETECTED 03/11/2022 1107   AMPHETMU NONE DETECTED 03/11/2022 1107   THCU NONE DETECTED 03/11/2022 1107   LABBARB NONE DETECTED 03/11/2022 1107    Alcohol Level      Component Value Date/Time   ETH <10 03/11/2022 1130    I have reviewed the images obtained:  CT Head without contrast 09/12/2022:  No acute intracranial findings. Chronic microvascular ischemic change and cerebral volume loss.  MRI Brain without contrast 09/12/2022: No acute intracranial abnormality. Mild cerebral and cerebellar atrophy with chronic small vessel ischemic disease.  ASSESSMENT/PLAN: 72 year old male with multiple medical comorbidities as described above presented with transient alteration of awarenessx2.  Transient alteration of awareness Left hemiparesis -Semiology of episodes is not typical epileptic seizures.  Differentials include convulsive syncope due to orthostasis and baseline myopathy/weakness versus arrhythmias versus less likely seizures -Unclear etiology of left hemiparesis.  This was not documented in his most recent neurology visit in March 2023.   Recommendations: -Routine EEG did not show any ictal-interictal abnormality.  Therefore will obtain overnight video EEG to look for potential epileptogenicity -Will obtain MRI CTL-spine to evaluate for left hemiparesis although low suspicion -If above workup unremarkable, would avoid starting antiseizure medications -Cardiology has also been consulted, considering cardiac monitor -Discussed plan with wife on phone and Dr. Hanley Ben via secure chat  Thank you for allowing [place] to participate in the care of this patient. If you have any further questions, please contact  me or neurohospitalist.   Lindie Spruce Epile[facility] neurohospitalist

## 2022-09-13 NOTE — Progress Notes (Signed)
PROGRESS NOTE    [name]  ZOX:[id number] DOB: [birth year] DOA: 09/12/2022 PCP: Everrett Coombe, DO   Brief Narrative:  72 year old male with history of autoimmune myopathy, rheumatoid arthritis, diabetes mellitus type 2, DVT and PE on Eliquis, chronic combined CHF, nonobstructive CAD, chronic lymphedema, CKD stage IIIa, OSA, GERD, hypothyroidism presented with loss of consciousness for 1 to 2 minutes with stiffening of whole body, rolling of eyes without visible shaking and bowel or bladder incontinence.  She also has increased leg swelling despite being on increased dose of torsemide as per cardiology as an outpatient.  On presentation, creatinine was at baseline.  CT of brain and MRI of brain did not show any acute intracranial abnormality.  Assessment & Plan:   Episode of loss of consciousness -Concern for seizure versus syncope.  Second episode of loss of consciousness with stiffening of the body and up rolling of eyes without visible body shaking within the last 2 weeks -CT of brain and MRI of brain did not show any acute intracranial abnormality. -EEG.  Fall precautions.  Seizure precautions.  PT eval.  I have consulted neurology.  Follow recommendations. -Mental status currently stable.  Monitor mental status.  Acute on chronic combined CHF Hypertension -Has been on increased dose of torsemide 20 mg daily as an outpatient as per cardiology recommendations. -Has had increased leg and abdominal swelling with weeping of right lower extremity. -2D echo.  Strict input and output.  Daily weights.  Fluid restriction.  Patient already received 20 mg torsemide today.  Will give Lasix 40 mg IV once as well.  I have consulted cardiology: Follow recommendations.  Continue Coreg  Chronic kidney disease stage IIIa -Creatinine currently stable.  Monitor  History of DVT and PE -Continue Eliquis  Chronic lymphedema -Follows up with lymphedema team as an outpatient and has compression stockings  as an outpatient. -PT eval  Diabetes mellitus type 2 with hyperglycemia -A1c 9.9.  Continue CBGs with SSI.  Continue long-acting insulin.  Continue carb modified diet  Obesity -Outpatient follow-up  Autoimmune myopathy and history of rheumatoid arthritis -Follows up with rheumatology as an outpatient.  Continue prednisone.  Resume methotrexate on discharge.    Anemia of chronic disease -From chronic illnesses.  Hemoglobin stable.  Monitor intermittently.  Hypothyroidism -Continue Synthroid  GERD -Continue Protonix   DVT prophylaxis: Eliquis Code Status: Full Family Communication: Wife at bedside Disposition Plan: Status is: Inpatient Remains inpatient appropriate because: Of severity of illness    Consultants: Cardiology/neurology  Procedures: None  Antimicrobials: None   Subjective: Patient seen and examined at bedside.  Denies any seizures, fever or vomiting since admission.  Complains of lower extremity and abdominal swelling.  Complains of sores in the mouth and difficulty swallowing.  Objective: Vitals:   09/12/22 1900 09/12/22 2106 09/12/22 2328 09/13/22 0721  BP: 134/79 121/71 (!) 145/79 (!) 141/71  Pulse: 73 74 71 69  Resp: 20 19 18  (!) 21  Temp: 98 F (36.7 C)  97.6 F (36.4 C) 97.6 F (36.4 C)  TempSrc: Oral  Oral Oral  SpO2: 100% 100% 96%   Weight:      Height:        Intake/Output Summary (Last 24 hours) at 09/13/2022 1023 Last data filed at 09/13/2022 0700 Gross per 24 hour  Intake 360 ml  Output 850 ml  Net -490 ml   Filed Weights   09/12/22 1325 09/12/22 1433 09/12/22 1844  Weight: 95.3 kg 95.3 kg 99.7 kg    Examination:  General  exam: Appears calm and comfortable.  On room air. Respiratory system: Bilateral decreased breath sounds at bases with scattered crackles.  Intermittently tachypneic Cardiovascular system: S1 & S2 heard, Rate controlled Gastrointestinal system: Abdomen is obese, nondistended, soft and nontender. Normal  bowel sounds heard. Extremities: No cyanosis, clubbing; bilateral lower extremity 2-3+ pitting edema present  Central nervous system: Alert and oriented. No focal neurological deficits. Moving extremities Skin: Right lower extremity dressing present: On removal, there is some skin peeling but no discharge  psychiatry: Judgement and insight appear normal. Mood & affect appropriate.     Data Reviewed: I have personally reviewed following labs and imaging studies  CBC: Recent Labs  Lab 09/12/22 1424  WBC 6.4  NEUTROABS 5.0  HGB 9.5*  HCT 28.8*  MCV 96.6  PLT 242   Basic Metabolic Panel: Recent Labs  Lab 09/12/22 1424 09/13/22 0023  NA 135 139  K 4.5 4.0  CL 100 104  CO2 25 25  GLUCOSE 284* 290*  BUN 23 17  CREATININE 1.27* 1.22  CALCIUM 9.2 9.7   GFR: Estimated Creatinine Clearance: 61.7 mL/min (by C-G formula based on SCr of 1.22 mg/dL). Liver Function Tests: Recent Labs  Lab 09/12/22 1424 09/13/22 0023  AST 24 20  ALT 21 21  ALKPHOS 49 45  BILITOT 0.6 0.6  PROT 6.4* 5.8*  ALBUMIN 3.9 3.7   No results for input(s): "LIPASE", "AMYLASE" in the last 168 hours. No results for input(s): "AMMONIA" in the last 168 hours. Coagulation Profile: No results for input(s): "INR", "PROTIME" in the last 168 hours. Cardiac Enzymes: No results for input(s): "CKTOTAL", "CKMB", "CKMBINDEX", "TROPONINI" in the last 168 hours. BNP (last 3 results) No results for input(s): "PROBNP" in the last 8760 hours. HbA1C: Recent Labs    09/13/22 0023  HGBA1C 9.9*   CBG: Recent Labs  Lab 09/12/22 1322 09/12/22 1853 09/13/22 0013 09/13/22 0624  GLUCAP 243* 267* 283* 153*   Lipid Profile: No results for input(s): "CHOL", "HDL", "LDLCALC", "TRIG", "CHOLHDL", "LDLDIRECT" in the last 72 hours. Thyroid Function Tests: No results for input(s): "TSH", "T4TOTAL", "FREET4", "T3FREE", "THYROIDAB" in the last 72 hours. Anemia Panel: No results for input(s): "VITAMINB12", "FOLATE",  "FERRITIN", "TIBC", "IRON", "RETICCTPCT" in the last 72 hours. Sepsis Labs: No results for input(s): "PROCALCITON", "LATICACIDVEN" in the last 168 hours.  Recent Results (from the past 240 hour(s))  MRSA Next Gen by PCR, Nasal     Status: None   Collection Time: 09/12/22  6:55 PM   Specimen: Nasal Mucosa; Nasal Swab  Result Value Ref Range Status   MRSA by PCR Next Gen NOT DETECTED NOT DETECTED Final    Comment: (NOTE) The GeneXpert MRSA Assay (FDA approved for NASAL specimens only), is one component of a comprehensive MRSA colonization surveillance program. It is not intended to diagnose MRSA infection nor to guide or monitor treatment for MRSA infections. Test performance is not FDA approved in patients less than 63 years old. Performed at [facility], [address], [place], [place] [address]       Scheduled Meds:  acetaminophen  1,000 mg Oral Daily   acetaminophen  500 mg Oral QHS   apixaban  5 mg Oral BID   calcium carbonate  1,250 mg Oral Q breakfast   carvedilol  6.25 mg Oral BID WC   folic acid  2 mg Oral Daily   insulin aspart  0-5 Units Subcutaneous QHS   insulin aspart  0-9 Units Subcutaneous TID WC  insulin detemir  10 Units Subcutaneous QHS   latanoprost  1 drop Both Eyes QHS   levothyroxine  75 mcg Oral Daily   pantoprazole  40 mg Oral Daily   predniSONE  15 mg Oral Q breakfast   sertraline  50 mg Oral Daily   sodium chloride flush  3 mL Intravenous Q12H   torsemide  20 mg Oral Daily   Continuous Infusions:        Glade Lloyd, MD [facility] 09/13/2022, 10:23 AM

## 2022-09-13 NOTE — Progress Notes (Signed)
Spoke with RN that we need to hook patient up to LTM EEG once patient is back form MRI.

## 2022-09-13 NOTE — Procedures (Signed)
Patient Name: [name]  MRN: [id number]  Epilepsy Attending: Charlsie Quest  Referring Physician/Provider: Buena Irish, MD  Date: 09/13/2022 Duration: 22.28 mins  Patient history: 72yo M with ams getting eeg to evaluate for seizure  Level of alertness: Awake,asleep  AEDs during EEG study: None  Technical aspects: This EEG study was done with scalp electrodes positioned according to the 10-20 International system of electrode placement. Electrical activity was reviewed with band pass filter of 1-70Hz , sensitivity of 7 uV/mm, display speed of 42mm/sec with a 60Hz  notched filter applied as appropriate. EEG data were recorded continuously and digitally stored.  Video monitoring was available and reviewed as appropriate.  Description: The posterior dominant rhythm consists of 9-10 Hz activity of moderate voltage (25-35 uV) seen predominantly in posterior head regions, symmetric and reactive to eye opening and eye closing. Sleep was characterized by vertex waves, sleep spindles (12 to 14 Hz), maximal frontocentral region. Hyperventilation and photic stimulation were not performed.     IMPRESSION: This study is within normal limits. No seizures or epileptiform discharges were seen throughout the recording.  A normal interictal EEG does not exclude the diagnosis of epilepsy.  Kindle Strohmeier Annabelle Harman

## 2022-09-13 NOTE — Progress Notes (Signed)
EEG complete - results pending 

## 2022-09-14 ENCOUNTER — Other Ambulatory Visit: Payer: Self-pay | Admitting: Home Health

## 2022-09-14 ENCOUNTER — Inpatient Hospital Stay (HOSPITAL_COMMUNITY)
Admit: 2022-09-14 | Discharge: 2022-09-14 | Disposition: A | Discharge status: Home / Self Care | Payer: Medicare Other | Attending: Internal Medicine | Admitting: Internal Medicine

## 2022-09-14 DIAGNOSIS — R569 Unspecified convulsions: Secondary | ICD-10-CM | POA: Diagnosis not present

## 2022-09-14 DIAGNOSIS — R55 Syncope and collapse: Secondary | ICD-10-CM

## 2022-09-14 LAB — GLUCOSE, CAPILLARY
Glucose-Capillary: 171 mg/dL — ABNORMAL HIGH (ref 70–99)
Glucose-Capillary: 209 mg/dL — ABNORMAL HIGH (ref 70–99)
Glucose-Capillary: 426 mg/dL — ABNORMAL HIGH (ref 70–99)
Glucose-Capillary: 486 mg/dL — ABNORMAL HIGH (ref 70–99)
Glucose-Capillary: 492 mg/dL — ABNORMAL HIGH (ref 70–99)
Glucose-Capillary: 434 mg/dL — ABNORMAL HIGH (ref 70–99)
Glucose-Capillary: 387 mg/dL — ABNORMAL HIGH (ref 70–99)
Glucose-Capillary: 341 mg/dL — ABNORMAL HIGH (ref 70–99)

## 2022-09-14 LAB — GLUCOSE, RANDOM: Glucose, Bld: 578 mg/dL (ref 70–99)

## 2022-09-14 MED ORDER — ZINC OXIDE 40 % EX OINT
TOPICAL_OINTMENT | Freq: Two times a day (BID) | CUTANEOUS | Status: DC
  Filled 2022-09-14 (×2): qty 57

## 2022-09-14 MED ORDER — FUROSEMIDE 10 MG/ML IJ SOLN
40.0000 mg | Freq: Once | INTRAMUSCULAR | Status: AC
  Administered 2022-09-14: 40 mg via INTRAVENOUS
  Filled 2022-09-14: qty 4

## 2022-09-14 MED ORDER — LIVING WELL WITH DIABETES BOOK
Freq: Once | Status: AC
  Filled 2022-09-14: qty 1

## 2022-09-14 MED ORDER — INSULIN ASPART 100 UNIT/ML IJ SOLN
25.0000 [IU] | Freq: Once | INTRAMUSCULAR | Status: AC
  Administered 2022-09-14: 25 [IU] via SUBCUTANEOUS

## 2022-09-14 MED ORDER — SERTRALINE HCL 25 MG PO TABS
50.0000 mg | ORAL_TABLET | Freq: Two times a day (BID) | ORAL | Status: DC
  Administered 2022-09-14 – 2022-09-15 (×2): 50 mg via ORAL
  Filled 2022-09-14 (×2): qty 2

## 2022-09-14 MED ORDER — INSULIN ASPART 100 UNIT/ML IJ SOLN
10.0000 [IU] | Freq: Once | INTRAMUSCULAR | Status: AC
  Administered 2022-09-14: 10 [IU] via SUBCUTANEOUS

## 2022-09-14 MED ORDER — INSULIN DETEMIR 100 UNIT/ML ~~LOC~~ SOLN
40.0000 [IU] | Freq: Every day | SUBCUTANEOUS | Status: DC
  Administered 2022-09-14: 40 [IU] via SUBCUTANEOUS
  Filled 2022-09-14 (×2): qty 0.4

## 2022-09-14 MED ORDER — TRAMADOL HCL 50 MG PO TABS
50.0000 mg | ORAL_TABLET | Freq: Four times a day (QID) | ORAL | Status: DC | PRN
  Administered 2022-09-14: 50 mg via ORAL
  Filled 2022-09-14: qty 1

## 2022-09-14 NOTE — Inpatient Diabetes Management (Signed)
Inpatient Diabetes Program Recommendations  AACE/ADA: New Consensus Statement on Inpatient Glycemic Control (2015)  Target Ranges:  Prepandial:   less than 140 mg/dL      Peak postprandial:   less than 180 mg/dL (1-2 hours)      Critically ill patients:  140 - 180 mg/dL   Lab Results  Component Value Date   GLUCAP 209 (H) 09/14/2022   HGBA1C 9.9 (H) 09/13/2022   Noted CBGs improved with change of insulin to Semglee. Spoke with pt. @ bedside. Reviewed A1c of 9.9 (average blood glucose 237 over the past 2-3 months). Reviewed basic plate method with decreased high carbohydrate/sugary drinks and food. Ordered Living Well With Diabetes booklet for review for patient.  Will follow during hospitalization.  Thank you, [name]. Stacy Sailer, RN, MSN, CDE  Diabetes Coordinator Inpatient Glycemic Control Team Team Pager [phone] (8am-5pm) 09/14/2022 2:43 PM

## 2022-09-14 NOTE — Progress Notes (Signed)
[facility] night cross cover note:   I was notified by RN that the patient's home Zoloft, which he is prescribed as an outpatient via the [place], as prescribed on a twice daily basis.  I subsequently updated his Zoloft order to reflect his outpatient frequency of twice daily dosing.Newton Pigg, DO Hospitalist

## 2022-09-14 NOTE — Progress Notes (Addendum)
[facility] night cross cover note:   I was notified by RN of the patient's elevated blood sugars, with most recent CBG showing 578 at 1800.  This is relative to patient's previous CBG results this afternoon, which were noted to be 426, followed by 486, followed by 492.  He received NovoLog 25 units subcu x 1 dose at 1809.  I ordered a repeat CBG to be checked now, with ensuing result of 434. I then ordered NovoLog 10 units subcu x 1 dose, with repeat CBG to be checked in 1 hour, and requested to be notified if ensuing CBG result remains greater than 400. As it looks like he was treated with IV Lasix earlier in the day, I'm refraining from ivf's for now.     Newton Pigg, DO Hospitalist

## 2022-09-14 NOTE — Progress Notes (Signed)
LTM EEG hooked up and running - no initial skin breakdown - push button tested - [facility] monitoring.  

## 2022-09-14 NOTE — Care Management Important Message (Signed)
Important Message  Patient Details  Name: [name] MRN: [id number] Date of Birth: 11-21-1950   Medicare Important Message Given:  Yes     Renie Ora 09/14/2022, 12:42 PM

## 2022-09-14 NOTE — Progress Notes (Addendum)
Rounding Note    Patient Name: [name] Date of Encounter: 09/14/2022  [facility] Cardiologist: Chrystie Nose, MD   Subjective   No acute events overnight. Currently on EEG.  Inpatient Medications    Scheduled Meds:  acetaminophen  1,000 mg Oral Daily   acetaminophen  500 mg Oral QHS   apixaban  5 mg Oral BID   baclofen  20 mg Oral BID   calcium carbonate  1,250 mg Oral Q breakfast   carvedilol  6.25 mg Oral BID WC   folic acid  2 mg Oral Daily   furosemide  40 mg Intravenous Once   insulin aspart  0-20 Units Subcutaneous TID WC   insulin aspart  0-5 Units Subcutaneous QHS   insulin detemir  30 Units Subcutaneous QHS   latanoprost  1 drop Both Eyes QHS   levothyroxine  75 mcg Oral Daily   liver oil-zinc oxide   Topical BID   pantoprazole  40 mg Oral Daily   predniSONE  15 mg Oral Q breakfast   sertraline  50 mg Oral Daily   sodium chloride flush  3 mL Intravenous Q12H   torsemide  20 mg Oral Daily   Continuous Infusions:  PRN Meds: acetaminophen **OR** acetaminophen, naLOXone (NARCAN)  injection, ondansetron **OR** ondansetron (ZOFRAN) IV, polyvinyl alcohol   Vital Signs    Vitals:   09/14/22 0412 09/14/22 0500 09/14/22 0600 09/14/22 0747  BP: 123/68   111/65  Pulse: 89  77 92  Resp: 19  20 19   Temp: 97.8 F (36.6 C)   98.1 F (36.7 C)  TempSrc: Oral   Oral  SpO2: 100%  98% 96%  Weight:  99.9 kg    Height:        Intake/Output Summary (Last 24 hours) at 09/14/2022 1013 Last data filed at 09/14/2022 0751 Gross per 24 hour  Intake 1200 ml  Output 4200 ml  Net -3000 ml      09/14/2022    5:00 AM 09/12/2022    6:44 PM 09/12/2022    2:33 PM  Last 3 Weights  Weight (lbs) 220 lb 3.8 oz 219 lb 12.8 oz 210 lb  Weight (kg) 99.9 kg 99.701 kg 95.255 kg      Telemetry    SR - Personally Reviewed  ECG    No new - Personally Reviewed  Physical Exam   GEN: No acute distress.  EEG leads in place. Neck: No JVD Cardiac: RRR, no murmurs,  rubs, or gallops.  Respiratory: Clear to auscultation bilaterally. GI: Soft, nontender, non-distended  MS: stable bilateral LE edema; No deformity. Neuro:  Nonfocal  Psych: Normal affect   Labs    High Sensitivity Troponin:   Recent Labs  Lab 08/26/22 2224 09/12/22 1424 09/12/22 1527  TROPONINIHS 16 15 15      Chemistry Recent Labs  Lab 09/12/22 1424 09/13/22 0023  NA 135 139  K 4.5 4.0  CL 100 104  CO2 25 25  GLUCOSE 284* 290*  BUN 23 17  CREATININE 1.27* 1.22  CALCIUM 9.2 9.7  PROT 6.4* 5.8*  ALBUMIN 3.9 3.7  AST 24 20  ALT 21 21  ALKPHOS 49 45  BILITOT 0.6 0.6  GFRNONAA 60* >60  ANIONGAP 10 10    Lipids No results for input(s): "CHOL", "TRIG", "HDL", "LABVLDL", "LDLCALC", "CHOLHDL" in the last 168 hours.  Hematology Recent Labs  Lab 09/12/22 1424  WBC 6.4  RBC 2.98*  HGB 9.5*  HCT 28.8*  MCV  96.6  MCH 31.9  MCHC 33.0  RDW 15.1  PLT 242   Thyroid No results for input(s): "TSH", "FREET4" in the last 168 hours.  BNPNo results for input(s): "BNP", "PROBNP" in the last 168 hours.  DDimer No results for input(s): "DDIMER" in the last 168 hours.   Radiology    Overnight EEG with video  Result Date: 09/14/2022 Charlsie Quest, MD     09/14/2022  9:34 AM Patient Name: [name] MRN: [id number] Epilepsy Attending: Charlsie Quest Referring Physician/Provider: Charlsie Quest, MD Duration: 09/13/2022 2301 to 09/14/2022 0930 Patient history: 72yo M with ams getting eeg to evaluate for seizure Level of alertness: Awake, asleep AEDs during EEG study: None Technical aspects: This EEG study was done with scalp electrodes positioned according to the 10-20 International system of electrode placement. Electrical activity was reviewed with band pass filter of 1-70Hz , sensitivity of 7 uV/mm, display speed of 71mm/sec with a 60Hz  notched filter applied as appropriate. EEG data were recorded continuously and digitally stored.  Video monitoring was available and reviewed as  appropriate.  Description: The posterior dominant rhythm consists of 9-10 Hz activity of moderate voltage (25-35 uV) seen predominantly in posterior head regions, symmetric and reactive to eye opening and eye closing. Sleep was characterized by vertex waves, sleep spindles (12 to 14 Hz), maximal frontocentral region. Hyperventilation and photic stimulation were not performed.   EKG artifact was seen throughout the study.  IMPRESSION: This study is within normal limits. No seizures or epileptiform discharges were seen throughout the recording.  A normal interictal EEG does not exclude the diagnosis of epilepsy.  Charlsie Quest    MR CERVICAL SPINE WO CONTRAST  Result Date: 09/13/2022 CLINICAL DATA:  Initial evaluation for radiculopathy. EXAM: MRI CERVICAL, THORACIC AND LUMBAR SPINE WITHOUT CONTRAST TECHNIQUE: Multiplanar and multiecho pulse sequences of the cervical spine, to include the craniocervical junction and cervicothoracic junction, and thoracic and lumbar spine, were obtained without intravenous contrast. COMPARISON:  Prior CT from 08/18/2022. FINDINGS: MRI CERVICAL SPINE FINDINGS Alignment: Straightening of the normal cervical lordosis. 3 mm retrolisthesis of C3 on C4 and C5 on C6, with trace 2 mm retrolisthesis of C2 on C3 and C4 on C5. Vertebrae: Vertebral body height maintained without acute or chronic fracture. Bone marrow signal intensity within normal limits. No discrete or worrisome osseous lesions or abnormal marrow edema. Cord: Normal signal and morphology. No convincing cord signal changes. Posterior Fossa, vertebral arteries, paraspinal tissues: Visualized brain and posterior fossa within normal limits. Craniocervical junction normal. Paraspinous soft tissues within normal limits. Loss of normal flow void within the left vertebral artery noted, likely occluded. Preserved flow void is seen within the distal left V4 segment on prior brain MRI. Disc levels: C2-C3: Disc bulge with bilateral  uncovertebral spurring, worse on the right. Right-sided facet arthrosis. No significant spinal stenosis. Moderate right worse than left C3 foraminal narrowing. C3-C4: Degenerative intervertebral disc space narrowing with diffuse disc bulge and uncovertebral spurring. Superimposed moderate-sized right paracentral disc protrusion indents and flattens the ventral thecal sac (series 5, image 17). Secondary cord flattening without convincing cord signal changes. Mild right-sided facet hypertrophy. Resultant severe spinal stenosis with the thecal sac measuring 4 mm in AP diameter. Severe right worse than left C4 foraminal stenosis. C4-C5: Left paracentral disc protrusion flattens and partially effaces the ventral thecal sac. Mild cord flattening without cord signal changes. Moderate right with mild left facet hypertrophy with mild uncovertebral spurring. Moderate spinal stenosis. Moderate bilateral C5 foraminal narrowing.  C5-C6: Degenerative vertebral disc space narrowing with diffuse disc osteophyte complex. Broad posterior component flattens and effaces the ventral thecal sac. Secondary cord flattening without cord signal changes. Superimposed facet hypertrophy. Resultant severe spinal stenosis with the thecal sac measuring 6 mm in AP diameter. Severe bilateral C6 foraminal narrowing. C6-C7: Left paracentral disc protrusion indents the ventral thecal sac, contacting the ventral cord (series 5, image 42). Mild cord flattening without cord signal changes. Mild right-sided facet arthrosis with mild uncovertebral spurring. Resultant moderate spinal stenosis. Moderate bilateral C7 foraminal narrowing. C7-T1: Tiny left paracentral disc protrusion minimally flattens the left ventral thecal sac. Mild right-sided facet hypertrophy. No spinal stenosis. Mild right C8 foraminal narrowing. Left neural foramina remains patent. MRI THORACIC SPINE FINDINGS Alignment: Mild exaggeration of the normal thoracic kyphosis. No listhesis.  Vertebrae: Vertebral body height maintained without acute or chronic fracture. Bone marrow signal intensity within normal limits. No worrisome osseous lesions or abnormal marrow edema. Cord:  Normal signal and morphology. Paraspinal and other soft tissues: Small layering bilateral pleural effusions noted. Otherwise unremarkable. Disc levels: T1-2: Tiny left paracentral disc protrusion minimally indents the ventral thecal sac. Mild facet hypertrophy. No stenosis. T2-3: Negative interspace. Right greater than left facet hypertrophy. No spinal stenosis. Mild right foraminal narrowing. Left neural foramen remains patent. T3-4: Negative interspace. Mild facet hypertrophy. No spinal stenosis. Mild right foraminal narrowing. Left neural foramina remains patent. T4-5: Negative interspace. Mild facet hypertrophy. No spinal stenosis. Moderate left foraminal narrowing. Right neural foramina remains patent. T5-6: Right paracentral to foraminal disc protrusion with minimal endplate spurring. Mild right greater left facet hypertrophy. No significant spinal stenosis. Moderate right foraminal narrowing. Left neural foramina remains patent. T6-7:  Minimal disc bulge.  Mild facet hypertrophy.  No stenosis. T7-8: Minimal endplate spurring without significant disc bulge. Mild facet hypertrophy. No spinal stenosis. Moderate left foraminal narrowing. Right neural foramen remains patent. T8-9: Mild disc bulge with endplate spurring. Mild facet hypertrophy. No spinal stenosis. Foramina remain patent. T9-10: Left paracentral disc protrusion with slight superior angulation (series 7, image 36). Mild flattening of the left hemi cord without cord signal changes. Mild facet hypertrophy. No significant spinal stenosis. Foramina remain patent. T10-11: Mild disc bulge with endplate spurring. Mild facet hypertrophy. No significant spinal stenosis. Foramina remain patent. T11-12:  Negative interspace.  Mild facet hypertrophy.  No stenosis. T12-L1:   Negative interspace.  Mild facet hypertrophy.  No stenosis. MRI LUMBAR SPINE FINDINGS Segmentation: Standard. Lowest well-formed disc space labeled the L5-S1 level. Alignment: 3 mm retrolisthesis of L5 on S1, with trace anterolisthesis of L4 on L5. Alignment otherwise normal preservation of the normal lumbar lordosis. Vertebrae: Vertebral body height maintained without acute or chronic fracture. Bone marrow signal intensity within normal limits. No worrisome osseous lesions or abnormal marrow edema. Conus medullaris and cauda equina: Conus extends to the L1 level. Conus and cauda equina appear normal. Paraspinal and other soft tissues: No acute finding. Chronic fatty atrophy noted within the posterior paraspinous musculature. Left-sided IVC noted. Disc levels: L1-2: Normal interspace. Mild left greater than right facet hypertrophy. No canal or foraminal stenosis. L2-3: Normal interspace. Mild right worse than left facet hypertrophy. No stenosis. L3-4: Normal interspace. Mild right worse than left facet hypertrophy. No spinal stenosis. Foramina remain patent. L4-5: Minimal annular disc bulge. Mild to moderate facet and ligament flavum hypertrophy. Resultant mild bilateral subarticular stenosis. Central canal remains adequately patent. Mild bilateral L4 foraminal narrowing. L5-S1: Mild degenerative intervertebral disc space narrowing with diffuse disc bulge, slightly asymmetric to the  right. Associated posterior annular fissure. Moderate bilateral facet hypertrophy. Epidural lipomatosis. No significant spinal stenosis. Moderate bilateral L5 foraminal narrowing. IMPRESSION: MRI CERVICAL SPINE IMPRESSION: 1. Multilevel cervical spondylosis with resultant moderate to severe diffuse spinal stenosis at C3-4 through C6-7, most pronounced at C3-4 secondary cord flattening at multiple levels but without convincing cord signal changes. 2. Multifactorial degenerative changes with resultant moderate to severe bilateral C3  through C7 foraminal stenosis as above. 3. Loss of normal flow void within the left vertebral artery, likely occluded. Preserved flow void is seen within the distal left V4 segment on prior brain MRI from 09/12/2022. MRI THORACIC SPINE IMPRESSION: 1. Left paracentral disc protrusion at T9-10 with mild flattening of the left hemi cord, but no cord signal changes or significant spinal stenosis. 2. Additional multilevel thoracic spondylosis and facet arthrosis throughout the thoracic spine as detailed above. No other significant spinal stenosis or overt neural impingement. Moderate foraminal stenosis on the left at T4-5 and T7-8, and on the right at T5-6. 3. Small layering bilateral pleural effusions. MRI LUMBAR SPINE IMPRESSION: 1. Mild lower lumbar spondylosis at L4-5 and L5-S1 with resultant mild bilateral subarticular stenosis at L4-5. Associated mild bilateral L4, with moderate bilateral L5 foraminal narrowing. 2. Mild-to-moderate multilevel facet hypertrophy, most pronounced at L5-S1. Findings could contribute to lower back pain. Electronically Signed   By: Rise Mu M.D.   On: 09/13/2022 21:57   MR LUMBAR SPINE WO CONTRAST  Result Date: 09/13/2022 CLINICAL DATA:  Initial evaluation for radiculopathy. EXAM: MRI CERVICAL, THORACIC AND LUMBAR SPINE WITHOUT CONTRAST TECHNIQUE: Multiplanar and multiecho pulse sequences of the cervical spine, to include the craniocervical junction and cervicothoracic junction, and thoracic and lumbar spine, were obtained without intravenous contrast. COMPARISON:  Prior CT from 08/18/2022. FINDINGS: MRI CERVICAL SPINE FINDINGS Alignment: Straightening of the normal cervical lordosis. 3 mm retrolisthesis of C3 on C4 and C5 on C6, with trace 2 mm retrolisthesis of C2 on C3 and C4 on C5. Vertebrae: Vertebral body height maintained without acute or chronic fracture. Bone marrow signal intensity within normal limits. No discrete or worrisome osseous lesions or abnormal marrow  edema. Cord: Normal signal and morphology. No convincing cord signal changes. Posterior Fossa, vertebral arteries, paraspinal tissues: Visualized brain and posterior fossa within normal limits. Craniocervical junction normal. Paraspinous soft tissues within normal limits. Loss of normal flow void within the left vertebral artery noted, likely occluded. Preserved flow void is seen within the distal left V4 segment on prior brain MRI. Disc levels: C2-C3: Disc bulge with bilateral uncovertebral spurring, worse on the right. Right-sided facet arthrosis. No significant spinal stenosis. Moderate right worse than left C3 foraminal narrowing. C3-C4: Degenerative intervertebral disc space narrowing with diffuse disc bulge and uncovertebral spurring. Superimposed moderate-sized right paracentral disc protrusion indents and flattens the ventral thecal sac (series 5, image 17). Secondary cord flattening without convincing cord signal changes. Mild right-sided facet hypertrophy. Resultant severe spinal stenosis with the thecal sac measuring 4 mm in AP diameter. Severe right worse than left C4 foraminal stenosis. C4-C5: Left paracentral disc protrusion flattens and partially effaces the ventral thecal sac. Mild cord flattening without cord signal changes. Moderate right with mild left facet hypertrophy with mild uncovertebral spurring. Moderate spinal stenosis. Moderate bilateral C5 foraminal narrowing. C5-C6: Degenerative vertebral disc space narrowing with diffuse disc osteophyte complex. Broad posterior component flattens and effaces the ventral thecal sac. Secondary cord flattening without cord signal changes. Superimposed facet hypertrophy. Resultant severe spinal stenosis with the thecal sac measuring 6 mm in  AP diameter. Severe bilateral C6 foraminal narrowing. C6-C7: Left paracentral disc protrusion indents the ventral thecal sac, contacting the ventral cord (series 5, image 42). Mild cord flattening without cord signal  changes. Mild right-sided facet arthrosis with mild uncovertebral spurring. Resultant moderate spinal stenosis. Moderate bilateral C7 foraminal narrowing. C7-T1: Tiny left paracentral disc protrusion minimally flattens the left ventral thecal sac. Mild right-sided facet hypertrophy. No spinal stenosis. Mild right C8 foraminal narrowing. Left neural foramina remains patent. MRI THORACIC SPINE FINDINGS Alignment: Mild exaggeration of the normal thoracic kyphosis. No listhesis. Vertebrae: Vertebral body height maintained without acute or chronic fracture. Bone marrow signal intensity within normal limits. No worrisome osseous lesions or abnormal marrow edema. Cord:  Normal signal and morphology. Paraspinal and other soft tissues: Small layering bilateral pleural effusions noted. Otherwise unremarkable. Disc levels: T1-2: Tiny left paracentral disc protrusion minimally indents the ventral thecal sac. Mild facet hypertrophy. No stenosis. T2-3: Negative interspace. Right greater than left facet hypertrophy. No spinal stenosis. Mild right foraminal narrowing. Left neural foramen remains patent. T3-4: Negative interspace. Mild facet hypertrophy. No spinal stenosis. Mild right foraminal narrowing. Left neural foramina remains patent. T4-5: Negative interspace. Mild facet hypertrophy. No spinal stenosis. Moderate left foraminal narrowing. Right neural foramina remains patent. T5-6: Right paracentral to foraminal disc protrusion with minimal endplate spurring. Mild right greater left facet hypertrophy. No significant spinal stenosis. Moderate right foraminal narrowing. Left neural foramina remains patent. T6-7:  Minimal disc bulge.  Mild facet hypertrophy.  No stenosis. T7-8: Minimal endplate spurring without significant disc bulge. Mild facet hypertrophy. No spinal stenosis. Moderate left foraminal narrowing. Right neural foramen remains patent. T8-9: Mild disc bulge with endplate spurring. Mild facet hypertrophy. No spinal  stenosis. Foramina remain patent. T9-10: Left paracentral disc protrusion with slight superior angulation (series 7, image 36). Mild flattening of the left hemi cord without cord signal changes. Mild facet hypertrophy. No significant spinal stenosis. Foramina remain patent. T10-11: Mild disc bulge with endplate spurring. Mild facet hypertrophy. No significant spinal stenosis. Foramina remain patent. T11-12:  Negative interspace.  Mild facet hypertrophy.  No stenosis. T12-L1:  Negative interspace.  Mild facet hypertrophy.  No stenosis. MRI LUMBAR SPINE FINDINGS Segmentation: Standard. Lowest well-formed disc space labeled the L5-S1 level. Alignment: 3 mm retrolisthesis of L5 on S1, with trace anterolisthesis of L4 on L5. Alignment otherwise normal preservation of the normal lumbar lordosis. Vertebrae: Vertebral body height maintained without acute or chronic fracture. Bone marrow signal intensity within normal limits. No worrisome osseous lesions or abnormal marrow edema. Conus medullaris and cauda equina: Conus extends to the L1 level. Conus and cauda equina appear normal. Paraspinal and other soft tissues: No acute finding. Chronic fatty atrophy noted within the posterior paraspinous musculature. Left-sided IVC noted. Disc levels: L1-2: Normal interspace. Mild left greater than right facet hypertrophy. No canal or foraminal stenosis. L2-3: Normal interspace. Mild right worse than left facet hypertrophy. No stenosis. L3-4: Normal interspace. Mild right worse than left facet hypertrophy. No spinal stenosis. Foramina remain patent. L4-5: Minimal annular disc bulge. Mild to moderate facet and ligament flavum hypertrophy. Resultant mild bilateral subarticular stenosis. Central canal remains adequately patent. Mild bilateral L4 foraminal narrowing. L5-S1: Mild degenerative intervertebral disc space narrowing with diffuse disc bulge, slightly asymmetric to the right. Associated posterior annular fissure. Moderate  bilateral facet hypertrophy. Epidural lipomatosis. No significant spinal stenosis. Moderate bilateral L5 foraminal narrowing. IMPRESSION: MRI CERVICAL SPINE IMPRESSION: 1. Multilevel cervical spondylosis with resultant moderate to severe diffuse spinal stenosis at C3-4 through C6-7, most  pronounced at C3-4 secondary cord flattening at multiple levels but without convincing cord signal changes. 2. Multifactorial degenerative changes with resultant moderate to severe bilateral C3 through C7 foraminal stenosis as above. 3. Loss of normal flow void within the left vertebral artery, likely occluded. Preserved flow void is seen within the distal left V4 segment on prior brain MRI from 09/12/2022. MRI THORACIC SPINE IMPRESSION: 1. Left paracentral disc protrusion at T9-10 with mild flattening of the left hemi cord, but no cord signal changes or significant spinal stenosis. 2. Additional multilevel thoracic spondylosis and facet arthrosis throughout the thoracic spine as detailed above. No other significant spinal stenosis or overt neural impingement. Moderate foraminal stenosis on the left at T4-5 and T7-8, and on the right at T5-6. 3. Small layering bilateral pleural effusions. MRI LUMBAR SPINE IMPRESSION: 1. Mild lower lumbar spondylosis at L4-5 and L5-S1 with resultant mild bilateral subarticular stenosis at L4-5. Associated mild bilateral L4, with moderate bilateral L5 foraminal narrowing. 2. Mild-to-moderate multilevel facet hypertrophy, most pronounced at L5-S1. Findings could contribute to lower back pain. Electronically Signed   By: Rise Mu M.D.   On: 09/13/2022 21:57   MR THORACIC SPINE WO CONTRAST  Result Date: 09/13/2022 CLINICAL DATA:  Initial evaluation for radiculopathy. EXAM: MRI CERVICAL, THORACIC AND LUMBAR SPINE WITHOUT CONTRAST TECHNIQUE: Multiplanar and multiecho pulse sequences of the cervical spine, to include the craniocervical junction and cervicothoracic junction, and thoracic and  lumbar spine, were obtained without intravenous contrast. COMPARISON:  Prior CT from 08/18/2022. FINDINGS: MRI CERVICAL SPINE FINDINGS Alignment: Straightening of the normal cervical lordosis. 3 mm retrolisthesis of C3 on C4 and C5 on C6, with trace 2 mm retrolisthesis of C2 on C3 and C4 on C5. Vertebrae: Vertebral body height maintained without acute or chronic fracture. Bone marrow signal intensity within normal limits. No discrete or worrisome osseous lesions or abnormal marrow edema. Cord: Normal signal and morphology. No convincing cord signal changes. Posterior Fossa, vertebral arteries, paraspinal tissues: Visualized brain and posterior fossa within normal limits. Craniocervical junction normal. Paraspinous soft tissues within normal limits. Loss of normal flow void within the left vertebral artery noted, likely occluded. Preserved flow void is seen within the distal left V4 segment on prior brain MRI. Disc levels: C2-C3: Disc bulge with bilateral uncovertebral spurring, worse on the right. Right-sided facet arthrosis. No significant spinal stenosis. Moderate right worse than left C3 foraminal narrowing. C3-C4: Degenerative intervertebral disc space narrowing with diffuse disc bulge and uncovertebral spurring. Superimposed moderate-sized right paracentral disc protrusion indents and flattens the ventral thecal sac (series 5, image 17). Secondary cord flattening without convincing cord signal changes. Mild right-sided facet hypertrophy. Resultant severe spinal stenosis with the thecal sac measuring 4 mm in AP diameter. Severe right worse than left C4 foraminal stenosis. C4-C5: Left paracentral disc protrusion flattens and partially effaces the ventral thecal sac. Mild cord flattening without cord signal changes. Moderate right with mild left facet hypertrophy with mild uncovertebral spurring. Moderate spinal stenosis. Moderate bilateral C5 foraminal narrowing. C5-C6: Degenerative vertebral disc space narrowing  with diffuse disc osteophyte complex. Broad posterior component flattens and effaces the ventral thecal sac. Secondary cord flattening without cord signal changes. Superimposed facet hypertrophy. Resultant severe spinal stenosis with the thecal sac measuring 6 mm in AP diameter. Severe bilateral C6 foraminal narrowing. C6-C7: Left paracentral disc protrusion indents the ventral thecal sac, contacting the ventral cord (series 5, image 42). Mild cord flattening without cord signal changes. Mild right-sided facet arthrosis with mild uncovertebral spurring. Resultant moderate  spinal stenosis. Moderate bilateral C7 foraminal narrowing. C7-T1: Tiny left paracentral disc protrusion minimally flattens the left ventral thecal sac. Mild right-sided facet hypertrophy. No spinal stenosis. Mild right C8 foraminal narrowing. Left neural foramina remains patent. MRI THORACIC SPINE FINDINGS Alignment: Mild exaggeration of the normal thoracic kyphosis. No listhesis. Vertebrae: Vertebral body height maintained without acute or chronic fracture. Bone marrow signal intensity within normal limits. No worrisome osseous lesions or abnormal marrow edema. Cord:  Normal signal and morphology. Paraspinal and other soft tissues: Small layering bilateral pleural effusions noted. Otherwise unremarkable. Disc levels: T1-2: Tiny left paracentral disc protrusion minimally indents the ventral thecal sac. Mild facet hypertrophy. No stenosis. T2-3: Negative interspace. Right greater than left facet hypertrophy. No spinal stenosis. Mild right foraminal narrowing. Left neural foramen remains patent. T3-4: Negative interspace. Mild facet hypertrophy. No spinal stenosis. Mild right foraminal narrowing. Left neural foramina remains patent. T4-5: Negative interspace. Mild facet hypertrophy. No spinal stenosis. Moderate left foraminal narrowing. Right neural foramina remains patent. T5-6: Right paracentral to foraminal disc protrusion with minimal endplate  spurring. Mild right greater left facet hypertrophy. No significant spinal stenosis. Moderate right foraminal narrowing. Left neural foramina remains patent. T6-7:  Minimal disc bulge.  Mild facet hypertrophy.  No stenosis. T7-8: Minimal endplate spurring without significant disc bulge. Mild facet hypertrophy. No spinal stenosis. Moderate left foraminal narrowing. Right neural foramen remains patent. T8-9: Mild disc bulge with endplate spurring. Mild facet hypertrophy. No spinal stenosis. Foramina remain patent. T9-10: Left paracentral disc protrusion with slight superior angulation (series 7, image 36). Mild flattening of the left hemi cord without cord signal changes. Mild facet hypertrophy. No significant spinal stenosis. Foramina remain patent. T10-11: Mild disc bulge with endplate spurring. Mild facet hypertrophy. No significant spinal stenosis. Foramina remain patent. T11-12:  Negative interspace.  Mild facet hypertrophy.  No stenosis. T12-L1:  Negative interspace.  Mild facet hypertrophy.  No stenosis. MRI LUMBAR SPINE FINDINGS Segmentation: Standard. Lowest well-formed disc space labeled the L5-S1 level. Alignment: 3 mm retrolisthesis of L5 on S1, with trace anterolisthesis of L4 on L5. Alignment otherwise normal preservation of the normal lumbar lordosis. Vertebrae: Vertebral body height maintained without acute or chronic fracture. Bone marrow signal intensity within normal limits. No worrisome osseous lesions or abnormal marrow edema. Conus medullaris and cauda equina: Conus extends to the L1 level. Conus and cauda equina appear normal. Paraspinal and other soft tissues: No acute finding. Chronic fatty atrophy noted within the posterior paraspinous musculature. Left-sided IVC noted. Disc levels: L1-2: Normal interspace. Mild left greater than right facet hypertrophy. No canal or foraminal stenosis. L2-3: Normal interspace. Mild right worse than left facet hypertrophy. No stenosis. L3-4: Normal interspace.  Mild right worse than left facet hypertrophy. No spinal stenosis. Foramina remain patent. L4-5: Minimal annular disc bulge. Mild to moderate facet and ligament flavum hypertrophy. Resultant mild bilateral subarticular stenosis. Central canal remains adequately patent. Mild bilateral L4 foraminal narrowing. L5-S1: Mild degenerative intervertebral disc space narrowing with diffuse disc bulge, slightly asymmetric to the right. Associated posterior annular fissure. Moderate bilateral facet hypertrophy. Epidural lipomatosis. No significant spinal stenosis. Moderate bilateral L5 foraminal narrowing. IMPRESSION: MRI CERVICAL SPINE IMPRESSION: 1. Multilevel cervical spondylosis with resultant moderate to severe diffuse spinal stenosis at C3-4 through C6-7, most pronounced at C3-4 secondary cord flattening at multiple levels but without convincing cord signal changes. 2. Multifactorial degenerative changes with resultant moderate to severe bilateral C3 through C7 foraminal stenosis as above. 3. Loss of normal flow void within the left vertebral artery,  likely occluded. Preserved flow void is seen within the distal left V4 segment on prior brain MRI from 09/12/2022. MRI THORACIC SPINE IMPRESSION: 1. Left paracentral disc protrusion at T9-10 with mild flattening of the left hemi cord, but no cord signal changes or significant spinal stenosis. 2. Additional multilevel thoracic spondylosis and facet arthrosis throughout the thoracic spine as detailed above. No other significant spinal stenosis or overt neural impingement. Moderate foraminal stenosis on the left at T4-5 and T7-8, and on the right at T5-6. 3. Small layering bilateral pleural effusions. MRI LUMBAR SPINE IMPRESSION: 1. Mild lower lumbar spondylosis at L4-5 and L5-S1 with resultant mild bilateral subarticular stenosis at L4-5. Associated mild bilateral L4, with moderate bilateral L5 foraminal narrowing. 2. Mild-to-moderate multilevel facet hypertrophy, most  pronounced at L5-S1. Findings could contribute to lower back pain. Electronically Signed   By: Rise Mu M.D.   On: 09/13/2022 21:57   ECHOCARDIOGRAM COMPLETE  Result Date: 09/13/2022    ECHOCARDIOGRAM REPORT   Patient Name:   [name] Date of Exam: 09/13/2022 Medical Rec #:  [id number]   Height:       68.0 in Accession #:    [id number]  Weight:       219.8 lb Date of Birth:  [birth year]    BSA:          2.127 m Patient Age:    71 years    BP:           102/77 mmHg Patient Gender: M           HR:           73 bpm. Exam Location:  Inpatient Procedure: 2D Echo, Cardiac Doppler, Color Doppler and Intracardiac            Opacification Agent Indications:    CHF- Acute diastolic  History:        Patient has prior history of Echocardiogram examinations, most                 recent 03/14/2022. CHF, Previous Myocardial Infarction; Risk                 Factors:Diabetes. Hx of DVT, PE, CKD.  Sonographer:    Milda Smart Referring Phys: [id number] [facility]  Sonographer Comments: Technically difficult study due to poor echo windows. Image acquisition challenging due to patient body habitus and Image acquisition challenging due to respiratory motion. IMPRESSIONS  1. Left ventricular ejection fraction, by estimation, is 60 to 65%. The left ventricle has normal function. The left ventricle has no regional wall motion abnormalities. There is mild concentric left ventricular hypertrophy. Left ventricular diastolic parameters are indeterminate. Elevated left ventricular end-diastolic pressure.  2. Right ventricular systolic function was not well visualized. The right ventricular size is normal.  3. The mitral valve is normal in structure. No evidence of mitral valve regurgitation. No evidence of mitral stenosis.  4. The aortic valve is tricuspid. Aortic valve regurgitation is trivial. Aortic valve sclerosis/calcification is present, without any evidence of aortic stenosis.  5. The inferior vena cava is normal in  size with greater than 50% respiratory variability, suggesting right atrial pressure of 3 mmHg.  6. Ascending aorta measurements are within normal limits for age when indexed to body surface area. FINDINGS  Left Ventricle: Left ventricular ejection fraction, by estimation, is 60 to 65%. The left ventricle has normal function. The left ventricle has no regional wall motion abnormalities. Definity contrast agent was given IV to delineate the left  ventricular  endocardial borders. The left ventricular internal cavity size was normal in size. There is mild concentric left ventricular hypertrophy. Left ventricular diastolic parameters are indeterminate. Elevated left ventricular end-diastolic pressure. Right Ventricle: The right ventricular size is normal. No increase in right ventricular wall thickness. Right ventricular systolic function was not well visualized. Left Atrium: Left atrial size was normal in size. Right Atrium: Right atrial size was normal in size. Pericardium: There is no evidence of pericardial effusion. Mitral Valve: The mitral valve is normal in structure. No evidence of mitral valve regurgitation. No evidence of mitral valve stenosis. MV peak gradient, 4.8 mmHg. The mean mitral valve gradient is 2.0 mmHg. Tricuspid Valve: The tricuspid valve is normal in structure. Tricuspid valve regurgitation is trivial. No evidence of tricuspid stenosis. Aortic Valve: The aortic valve is tricuspid. Aortic valve regurgitation is trivial. Aortic valve sclerosis/calcification is present, without any evidence of aortic stenosis. Pulmonic Valve: The pulmonic valve was normal in structure. Pulmonic valve regurgitation is mild. No evidence of pulmonic stenosis. Aorta: The aortic root is normal in size and structure. Ascending aorta measurements are within normal limits for age when indexed to body surface area. Venous: The inferior vena cava is normal in size with greater than 50% respiratory variability, suggesting  right atrial pressure of 3 mmHg. IAS/Shunts: No atrial level shunt detected by color flow Doppler.  LEFT VENTRICLE PLAX 2D LVIDd:         4.00 cm     Diastology LVIDs:         3.10 cm     LV e' medial:    4.21 cm/s LV PW:         1.20 cm     LV E/e' medial:  19.7 LV IVS:        1.10 cm     LV e' lateral:   5.70 cm/s LVOT diam:     2.30 cm     LV E/e' lateral: 14.6 LV SV:         94 LV SV Index:   44 LVOT Area:     4.15 cm  LV Volumes (MOD) LV vol d, MOD A2C: 71.2 ml LV vol d, MOD A4C: 76.3 ml LV vol s, MOD A2C: 33.1 ml LV vol s, MOD A4C: 31.6 ml LV SV MOD A2C:     38.1 ml LV SV MOD A4C:     76.3 ml LV SV MOD BP:      40.7 ml RIGHT VENTRICLE            IVC RV S prime:     9.99 cm/s  IVC diam: 1.60 cm TAPSE (M-mode): 1.4 cm LEFT ATRIUM             Index        RIGHT ATRIUM           Index LA diam:        3.40 cm 1.60 cm/m   RA Area:     11.80 cm LA Vol (A2C):   49.9 ml 23.46 ml/m  RA Volume:   23.90 ml  11.23 ml/m LA Vol (A4C):   41.5 ml 19.51 ml/m LA Biplane Vol: 46.4 ml 21.81 ml/m  AORTIC VALVE LVOT Vmax:   97.30 cm/s LVOT Vmean:  74.900 cm/s LVOT VTI:    0.226 m  AORTA Ao Root diam: 3.80 cm Ao Asc diam:  4.00 cm MITRAL VALVE MV Area (PHT): 3.53 cm    SHUNTS MV Area VTI:  3.04 cm    Systemic VTI:  0.23 m MV Peak grad:  4.8 mmHg    Systemic Diam: 2.30 cm MV Mean grad:  2.0 mmHg MV Vmax:       1.09 m/s MV Vmean:      61.5 cm/s MV Decel Time: 215 msec MV E velocity: 83.10 cm/s MV A velocity: 90.80 cm/s MV E/A ratio:  0.92 Armanda Magic MD Electronically signed by Armanda Magic MD Signature Date/Time: 09/13/2022/11:26:16 AM    Final    EEG adult  Result Date: 09/13/2022 Charlsie Quest, MD     09/13/2022 10:04 AM Patient Name: [name] MRN: [id number] Epilepsy Attending: Charlsie Quest Referring Physician/Provider: Buena Irish, MD Date: 09/13/2022 Duration: 22.28 mins Patient history: 72yo M with ams getting eeg to evaluate for seizure Level of alertness: Awake,asleep AEDs during EEG study: None  Technical aspects: This EEG study was done with scalp electrodes positioned according to the 10-20 International system of electrode placement. Electrical activity was reviewed with band pass filter of 1-70Hz , sensitivity of 7 uV/mm, display speed of 23mm/sec with a 60Hz  notched filter applied as appropriate. EEG data were recorded continuously and digitally stored.  Video monitoring was available and reviewed as appropriate. Description: The posterior dominant rhythm consists of 9-10 Hz activity of moderate voltage (25-35 uV) seen predominantly in posterior head regions, symmetric and reactive to eye opening and eye closing. Sleep was characterized by vertex waves, sleep spindles (12 to 14 Hz), maximal frontocentral region. Hyperventilation and photic stimulation were not performed.   IMPRESSION: This study is within normal limits. No seizures or epileptiform discharges were seen throughout the recording. A normal interictal EEG does not exclude the diagnosis of epilepsy. Charlsie Quest   MR BRAIN WO CONTRAST  Result Date: 09/13/2022 CLINICAL DATA:  Initial evaluation for seizure. EXAM: MRI HEAD WITHOUT CONTRAST TECHNIQUE: Multiplanar, multiecho pulse sequences of the brain and surrounding structures were obtained without intravenous contrast. COMPARISON:  Prior CT from earlier the same day. FINDINGS: Brain: Mild cerebral and cerebellar atrophy. Patchy T2/FLAIR hyperintensity involving the periventricular white matter, consistent with chronic small vessel ischemic disease, mild in nature. No evidence for acute or subacute ischemia. No parenchymal changes of status epilepticus are seen. No areas of chronic cortical infarction or other insult. No acute or chronic intracranial blood products. No mass lesion, midline shift or mass effect. Mild ventricular prominence related to global parenchymal volume loss of hydrocephalus. No extra-axial fluid collection. Pituitary gland and suprasellar region within normal  limits. No intrinsic temporal lobe abnormality. Vascular: Major intracranial vascular flow voids are maintained. Skull and upper cervical spine: Craniocervical junction within normal limits. Bone marrow signal intensity within normal limits. No scalp soft tissue abnormality. Sinuses/Orbits: Globes and orbital soft tissues within normal limits. Scattered mucosal thickening noted about the frontoethmoidal and maxillary sinuses. No mastoid effusion. Other: None. IMPRESSION: 1. No acute intracranial abnormality. 2. Mild cerebral and cerebellar atrophy with chronic small vessel ischemic disease. Electronically Signed   By: Rise Mu M.D.   On: 09/13/2022 01:17   DG Chest Portable 1 View  Result Date: 09/12/2022 CLINICAL DATA:  Loss of consciousness EXAM: PORTABLE CHEST 1 VIEW COMPARISON:  08/26/2022 x-ray and older FINDINGS: Stable enlarged cardiopericardial silhouette. Improving left basilar atelectasis. No consolidation, pneumothorax, effusion or edema. Overlapping cardiac leads. Degenerative changes seen of the shoulders. IMPRESSION: Improving left basilar atelectasis. Electronically Signed   By: Karen Kays M.D.   On: 09/12/2022 14:36   CT Head Wo Contrast  Result Date: 09/12/2022 CLINICAL DATA:  Mental status change, unknown cause EXAM: CT HEAD WITHOUT CONTRAST TECHNIQUE: Contiguous axial images were obtained from the base of the skull through the vertex without intravenous contrast. RADIATION DOSE REDUCTION: This exam was performed according to the departmental dose-optimization program which includes automated exposure control, adjustment of the mA and/or kV according to patient size and/or use of iterative reconstruction technique. COMPARISON:  08/26/2022 FINDINGS: Brain: No evidence of acute infarction, hemorrhage, hydrocephalus, extra-axial collection or mass lesion/mass effect. Scattered low-density changes within the periventricular and subcortical white matter most compatible with chronic  microvascular ischemic change. Mild diffuse cerebral volume loss. Vascular: Atherosclerotic calcifications involving the large vessels of the skull base. No unexpected hyperdense vessel. Skull: Normal. Negative for fracture or focal lesion. Sinuses/Orbits: Similar degree of right-sided paranasal sinus mucosal thickening. Other: None. IMPRESSION: 1. No acute intracranial findings. 2. Chronic microvascular ischemic change and cerebral volume loss. Electronically Signed   By: Duanne Guess D.O.   On: 09/12/2022 14:19    Cardiac Studies   Cardiac Studies & Procedures       ECHOCARDIOGRAM  ECHOCARDIOGRAM COMPLETE 09/13/2022  Narrative ECHOCARDIOGRAM REPORT    Patient Name:   [name] Date of Exam: 09/13/2022 Medical Rec #:  [id number]   Height:       68.0 in Accession #:    [id number]  Weight:       219.8 lb Date of Birth:  03-11-1951    BSA:          2.127 m Patient Age:    71 years    BP:           102/77 mmHg Patient Gender: M           HR:           73 bpm. Exam Location:  Inpatient  Procedure: 2D Echo, Cardiac Doppler, Color Doppler and Intracardiac Opacification Agent  Indications:    CHF- Acute diastolic  History:        Patient has prior history of Echocardiogram examinations, most recent 03/14/2022. CHF, Previous Myocardial Infarction; Risk Factors:Diabetes. Hx of DVT, PE, CKD.  Sonographer:    Milda Smart Referring Phys: [id number] [facility]   Sonographer Comments: Technically difficult study due to poor echo windows. Image acquisition challenging due to patient body habitus and Image acquisition challenging due to respiratory motion. IMPRESSIONS   1. Left ventricular ejection fraction, by estimation, is 60 to 65%. The left ventricle has normal function. The left ventricle has no regional wall motion abnormalities. There is mild concentric left ventricular hypertrophy. Left ventricular diastolic parameters are indeterminate. Elevated left ventricular  end-diastolic pressure. 2. Right ventricular systolic function was not well visualized. The right ventricular size is normal. 3. The mitral valve is normal in structure. No evidence of mitral valve regurgitation. No evidence of mitral stenosis. 4. The aortic valve is tricuspid. Aortic valve regurgitation is trivial. Aortic valve sclerosis/calcification is present, without any evidence of aortic stenosis. 5. The inferior vena cava is normal in size with greater than 50% respiratory variability, suggesting right atrial pressure of 3 mmHg. 6. Ascending aorta measurements are within normal limits for age when indexed to body surface area.  FINDINGS Left Ventricle: Left ventricular ejection fraction, by estimation, is 60 to 65%. The left ventricle has normal function. The left ventricle has no regional wall motion abnormalities. Definity contrast agent was given IV to delineate the left ventricular endocardial borders. The left ventricular internal cavity size was normal in size.  There is mild concentric left ventricular hypertrophy. Left ventricular diastolic parameters are indeterminate. Elevated left ventricular end-diastolic pressure.  Right Ventricle: The right ventricular size is normal. No increase in right ventricular wall thickness. Right ventricular systolic function was not well visualized.  Left Atrium: Left atrial size was normal in size.  Right Atrium: Right atrial size was normal in size.  Pericardium: There is no evidence of pericardial effusion.  Mitral Valve: The mitral valve is normal in structure. No evidence of mitral valve regurgitation. No evidence of mitral valve stenosis. MV peak gradient, 4.8 mmHg. The mean mitral valve gradient is 2.0 mmHg.  Tricuspid Valve: The tricuspid valve is normal in structure. Tricuspid valve regurgitation is trivial. No evidence of tricuspid stenosis.  Aortic Valve: The aortic valve is tricuspid. Aortic valve regurgitation is trivial. Aortic  valve sclerosis/calcification is present, without any evidence of aortic stenosis.  Pulmonic Valve: The pulmonic valve was normal in structure. Pulmonic valve regurgitation is mild. No evidence of pulmonic stenosis.  Aorta: The aortic root is normal in size and structure. Ascending aorta measurements are within normal limits for age when indexed to body surface area.  Venous: The inferior vena cava is normal in size with greater than 50% respiratory variability, suggesting right atrial pressure of 3 mmHg.  IAS/Shunts: No atrial level shunt detected by color flow Doppler.   LEFT VENTRICLE PLAX 2D LVIDd:         4.00 cm     Diastology LVIDs:         3.10 cm     LV e' medial:    4.21 cm/s LV PW:         1.20 cm     LV E/e' medial:  19.7 LV IVS:        1.10 cm     LV e' lateral:   5.70 cm/s LVOT diam:     2.30 cm     LV E/e' lateral: 14.6 LV SV:         94 LV SV Index:   44 LVOT Area:     4.15 cm  LV Volumes (MOD) LV vol d, MOD A2C: 71.2 ml LV vol d, MOD A4C: 76.3 ml LV vol s, MOD A2C: 33.1 ml LV vol s, MOD A4C: 31.6 ml LV SV MOD A2C:     38.1 ml LV SV MOD A4C:     76.3 ml LV SV MOD BP:      40.7 ml  RIGHT VENTRICLE            IVC RV S prime:     9.99 cm/s  IVC diam: 1.60 cm TAPSE (M-mode): 1.4 cm  LEFT ATRIUM             Index        RIGHT ATRIUM           Index LA diam:        3.40 cm 1.60 cm/m   RA Area:     11.80 cm LA Vol (A2C):   49.9 ml 23.46 ml/m  RA Volume:   23.90 ml  11.23 ml/m LA Vol (A4C):   41.5 ml 19.51 ml/m LA Biplane Vol: 46.4 ml 21.81 ml/m AORTIC VALVE LVOT Vmax:   97.30 cm/s LVOT Vmean:  74.900 cm/s LVOT VTI:    0.226 m  AORTA Ao Root diam: 3.80 cm Ao Asc diam:  4.00 cm  MITRAL VALVE MV Area (PHT): 3.53 cm    SHUNTS MV Area VTI:  3.04 cm    Systemic VTI:  0.23 m MV Peak grad:  4.8 mmHg    Systemic Diam: 2.30 cm MV Mean grad:  2.0 mmHg MV Vmax:       1.09 m/s MV Vmean:      61.5 cm/s MV Decel Time: 215 msec MV E velocity: 83.10 cm/s MV  A velocity: 90.80 cm/s MV E/A ratio:  0.92  Armanda Magic MD Electronically signed by Armanda Magic MD Signature Date/Time: 09/13/2022/11:26:16 AM    Final     CT SCANS  CT CORONARY MORPH W/CTA COR W/SCORE 08/28/2021  Addendum 08/28/2021  3:20 PM ADDENDUM REPORT: 08/28/2021 15:17  CLINICAL DATA:  Chest pain  EXAM: Cardiac CTA  MEDICATIONS: Sub lingual nitro. 4mg  x 2  TECHNIQUE: The patient was scanned on a Siemens 192 slice scanner. Gantry rotation speed was 250 msecs. Collimation was 0.6 mm. A 100 kV prospective scan was triggered in the ascending thoracic aorta at 35-75% of the R-R interval. Average HR during the scan was 60 bpm. The 3D data set was interpreted on a dedicated work station using MPR, MIP and VRT modes. A total of 80cc of contrast was used.  FINDINGS: Non-cardiac: See separate report from [facility].  Pulmonary veins drain normally to the left atrium. No LA appendage thrombus noted.  Calcium Score: 129 Agatston units.  Coronary Arteries: Right dominant with no anomalies  LM: Mixed plaque distal left main, mild (1-24%) stenosis.  LAD system: Mixed plaque ostial/proximal LAD, mild (25-49%) stenosis.  Circumflex system: No plaque or stenosis.  RCA system: No plaque or stenosis.  IMPRESSION: 1. Coronary artery calcium score 129 Agatston units. This places the patient in the 64th percentile for age and gender, suggesting intermediate risk for future cardiac events.  2. Suspect mild (25-49%) stenosis in the ostial/proximal LAD. Will send for FFR to confirm.  Dalton Mclean   Electronically Signed By: Marca Ancona M.D. On: 08/28/2021 15:17  Narrative EXAM: OVER-READ INTERPRETATION  CT CHEST  The following report is an over-read performed by radiologist Dr. Charlett Nose of [facility] on 08/28/2021. This over-read does not include interpretation of cardiac or coronary anatomy or pathology. The coronary CTA  interpretation by the cardiologist is attached.  COMPARISON:  07/21/2021  FINDINGS: Vascular: Heart is normal size. Aorta normal caliber. Previously seen saddle embolus no longer visualized. No central pulmonary emboli. The segmental and subsegmental vessels are not well opacified on this coronary study.  Mediastinum/Nodes: No adenopathy  Lungs/Pleura: No confluent opacities or effusions.  Upper Abdomen: No acute findings  Musculoskeletal: Chest wall soft tissues are unremarkable. No acute bony abnormality.  IMPRESSION: No acute or significant extracardiac abnormality.  Previously seen saddle embolus no longer visualized. No central pulmonary embolus seen.  Electronically Signed: By: Charlett Nose M.D. On: 08/28/2021 14:33           Patient Profile     72 y.o. 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, nonobstructive CAD, lymphedema, whom we are asked to see for syncope   Assessment & Plan    Loss of consciousness -no chest pain, no palpitations, no prodrome -stiffening, eyes rolling back concerning more for neurologic etiology -Echo with EF 60-65%, no significant valve disease -would get 2 week live Zio monitor for further evaluation at discharge -no events on telemetry thus far   Chronic lymphedema History of reduced ejection fraction, now recovered -reports edema is at baseline or slightly better than normal -continue oral torsemide  History of DVT/PE -on apixaban long term  [facility] will sign off.   Medication Recommendations:  no change Other recommendations (labs, testing, etc):  follow up Zio monitor Follow up as an outpatient:  We will arrange for outpatient cardiology follow up, timed for when monitor results should return.  For questions or updates, please contact [facility] Please consult [email or web address] for contact info under        Signed, Jodelle Red, MD   09/14/2022, 10:13 AM

## 2022-09-14 NOTE — Progress Notes (Signed)
Subjective: No acute events overnight.  Denies any concerns.  ROS: negative except above  Examination  Vital signs in last 24 hours: Temp:  [97.7 F (36.5 C)-98.1 F (36.7 C)] 98.1 F (36.7 C) (05/17 0747) Pulse Rate:  [73-100] 92 (05/17 0747) Resp:  [14-22] 19 (05/17 0747) BP: (111-143)/(57-78) 111/65 (05/17 0747) SpO2:  [96 %-100 %] 96 % (05/17 0747) Weight:  [99.9 kg] 99.9 kg (05/17 0500)  General: lying in bed, NAD Neuro: AOx3, no aphasia, cranial nerves II to XII grossly intact, 4/5 in bilateral upper extremities with subtle weakness in left upper extremity, 3/5 in bilateral lower extremity  Basic Metabolic Panel: Recent Labs  Lab 09/12/22 1424 09/13/22 0023  NA 135 139  K 4.5 4.0  CL 100 104  CO2 25 25  GLUCOSE 284* 290*  BUN 23 17  CREATININE 1.27* 1.22  CALCIUM 9.2 9.7    CBC: Recent Labs  Lab 09/12/22 1424  WBC 6.4  NEUTROABS 5.0  HGB 9.5*  HCT 28.8*  MCV 96.6  PLT 242     Coagulation Studies: No results for input(s): "LABPROT", "INR" in the last 72 hours.  Imaging MRI CTL spine without contrast 09/13/2022:  IMPRESSION: MRI CERVICAL SPINE IMPRESSION:   1. Multilevel cervical spondylosis with resultant moderate to severe diffuse spinal stenosis at C3-4 through C6-7, most pronounced at C3-4 secondary cord flattening at multiple levels but without convincing cord signal changes. 2. Multifactorial degenerative changes with resultant moderate to severe bilateral C3 through C7 foraminal stenosis as above.  3. Loss of normal flow void within the left vertebral artery, likely occluded. Preserved flow void is seen within the distal left V4 segment on prior brain MRI from 09/12/2022.   MRI THORACIC SPINE IMPRESSION:   1. Left paracentral disc protrusion at T9-10 with mild flattening of the left hemi cord, but no cord signal changes or significant spinal stenosis. 2. Additional multilevel thoracic spondylosis and facet arthrosis throughout the thoracic  spine as detailed above. No other significant spinal stenosis or overt neural impingement. Moderate foraminal stenosis on the left at T4-5 and T7-8, and on the right at T5-6. 3. Small layering bilateral pleural effusions.   MRI LUMBAR SPINE IMPRESSION:   1. Mild lower lumbar spondylosis at L4-5 and L5-S1 with resultant mild bilateral subarticular stenosis at L4-5. Associated mild bilateral L4, with moderate bilateral L5 foraminal narrowing. 2. Mild-to-moderate multilevel facet hypertrophy, most pronounced at L5-S1. Findings could contribute to lower back pain.   ASSESSMENT AND PLAN:  72 year old male with multiple medical comorbidities as described above presented with transient alteration of awarenessx2.   Transient alteration of awareness Left hemiparesis -Semiology of episodes is not typical epileptic seizures.  Differentials include convulsive syncope due to orthostasis and baseline myopathy/weakness versus arrhythmias versus less likely seizures -Unclear etiology of left hemiparesis.  This was not documented in his most recent neurology visit in March 2023.  Could be worsening of myopathy, less likely MRI negative stroke (no abrupt onset of weakness)   Recommendations: -Will continue video EEG for another 24 hours to look for potential epileptogenicity.  If negative, it is unlikely that the episode was epileptic.  Therefore we will hold off on starting antiseizure medication -However, if episodes keep recurring, may consider trial of antiepileptic medications at that point or plan for EMU admission -Do recommend seizure precautions in the meantime including no driving -Discussed plan with Dr. Hanley Ben via secure chat -Recommend follow-up with patient's neurologist at [facility].  Seizure precautions: Per [facility]  statutes, patients with seizures are not allowed to drive until they have been seizure-free for six months and cleared by a physician    Use caution when using heavy  equipment or power tools. Avoid working on ladders or at heights. Take showers instead of baths. Ensure the water temperature is not too high on the home water heater. Do not go swimming alone. Do not lock yourself in a room alone (i.e. bathroom). When caring for infants or small children, sit down when holding, feeding, or changing them to minimize risk of injury to the child in the event you have a seizure. Maintain good sleep hygiene. Avoid alcohol.    If patient has another seizure, call 911 and bring them back to the ED if: A.  The seizure lasts longer than 5 minutes.      B.  The patient doesn't wake shortly after the seizure or has new problems such as difficulty seeing, speaking or moving following the seizure C.  The patient was injured during the seizure D.  The patient has a temperature over 102 F (39C) E.  The patient vomited during the seizure and now is having trouble breathing    During the Seizure   - First, ensure adequate ventilation and place patients on the floor on their left side  Loosen clothing around the neck and ensure the airway is patent. If the patient is clenching the teeth, do not force the mouth open with any object as this can cause severe damage - Remove all items from the surrounding that can be hazardous. The patient may be oblivious to what's happening and may not even know what he or she is doing. If the patient is confused and wandering, either gently guide him/her away and block access to outside areas - Reassure the individual and be comforting - Call 911. In most cases, the seizure ends before EMS arrives. However, there are cases when seizures may last over 3 to 5 minutes. Or the individual may have developed breathing difficulties or severe injuries. If a pregnant patient or a person with diabetes develops a seizure, it is prudent to call an ambulance. - Finally, if the patient does not regain full consciousness, then call EMS. Most patients will remain  confused for about 45 to 90 minutes after a seizure, so you must use judgment in calling for help.    After the Seizure (Postictal Stage)   After a seizure, most patients experience confusion, fatigue, muscle pain and/or a headache. Thus, one should permit the individual to sleep. For the next few days, reassurance is essential. Being calm and helping reorient the person is also of importance.   Most seizures are painless and end spontaneously. Seizures are not harmful to others but can lead to complications such as stress on the lungs, brain and the heart. Individuals with prior lung problems may develop labored breathing and respiratory distress.     Thank you for allowing [place] to participate in the care of this patient. If you have any further questions, please contact  me or neurohospitalist.   I have spent a total of  36  minutes with the patient reviewing hospital notes,  test results, labs and examining the patient as well as establishing an assessment and plan that was discussed personally with the patient.  > 50% of time was spent in direct patient care.   Lindie Spruce Epilepsy [facility] For questions after 5pm please refer to AMION to reach the Neurologist on call

## 2022-09-14 NOTE — Progress Notes (Signed)
? ?  Inpatient Rehab Admissions Coordinator : ? ?Per therapy recommendations, patient was screened for CIR candidacy by Monet North RN MSN.  At this time patient appears to be a potential candidate for CIR. I will place a rehab consult per protocol for full assessment. Please call me with any questions. ? ?Cynithia Hakimi RN MSN ?Admissions Coordinator ?[phone] ?  ?

## 2022-09-14 NOTE — Progress Notes (Signed)
PROGRESS NOTE    [name]  ONG:[id number] DOB: [birth year] DOA: 09/12/2022 PCP: Everrett Coombe, DO   Brief Narrative:  72 year old male with history of autoimmune myopathy, rheumatoid arthritis, diabetes mellitus type 2, DVT and PE on Eliquis, chronic combined CHF, nonobstructive CAD, chronic lymphedema, CKD stage IIIa, OSA, GERD, hypothyroidism presented with loss of consciousness for 1 to 2 minutes with stiffening of whole body, rolling of eyes without visible shaking and bowel or bladder incontinence.  She also has increased leg swelling despite being on increased dose of torsemide as per cardiology as an outpatient.  On presentation, creatinine was at baseline.  CT of brain and MRI of brain did not show any acute intracranial abnormality.  Assessment & Plan:   Episode of loss of consciousness -Concern for seizure versus syncope.  Second episode of loss of consciousness with stiffening of the body and up rolling of eyes without visible body shaking within the last 2 weeks -CT of brain and MRI of brain did not show any acute intracranial abnormality. -EEG was negative for seizures.  Neurology consulted.  Currently undergoing LTM EEG. -Fall precautions.  Seizure precautions.  PT eval.   -Mental status currently stable.  Monitor mental status.  Acute on chronic combined CHF Hypertension -Has been on increased dose of torsemide 20 mg daily as an outpatient as per cardiology recommendations. -Has had increased leg and abdominal swelling with weeping of right lower extremity. -2D echo showed EF of 60 to 65%.  Strict input and output.  Daily weights.  Fluid restriction.  Cardiology evaluation appreciated: Recommended to continue torsemide 20 mg daily.  Continue Coreg.  Patient received a dose of IV Lasix on 09/13/2022 and had good diuresis: Negative balance of 3490 cc since admission.  Will give another dose of IV Lasix today.  Chronic kidney disease stage IIIa -Creatinine currently stable.   Monitor.  No labs today.  Repeat a.m. labs.  History of DVT and PE -Continue Eliquis  Chronic lymphedema -Follows up with lymphedema team as an outpatient and has compression stockings as an outpatient. -PT eval  Diabetes mellitus type 2 with hyperglycemia -A1c 9.9.  Continue CBGs with SSI.  Continue long-acting insulin.  Continue carb modified diet  Obesity -Outpatient follow-up  Autoimmune myopathy and history of rheumatoid arthritis -Follows up with rheumatology as an outpatient.  Continue prednisone.  Resume methotrexate on discharge.    Anemia of chronic disease -From chronic illnesses.  Hemoglobin stable.  Monitor intermittently.  Hypothyroidism -Continue Synthroid  GERD -Continue Protonix   DVT prophylaxis: Eliquis Code Status: Full Family Communication: Wife at bedside Disposition Plan: Status is: Inpatient Remains inpatient appropriate because: Of severity of illness    Consultants: Cardiology/neurology  Procedures: 2D echo.  EEG.  LTM EEG.  Antimicrobials: None   Subjective: Patient seen and examined at bedside.  Feels slightly better.  Complains of neck and upper back pain.  No fever or vomiting reported.  Leg swelling is improving. Objective: Vitals:   09/14/22 0412 09/14/22 0500 09/14/22 0600 09/14/22 0747  BP: 123/68   111/65  Pulse: 89  77 92  Resp: 19  20 19   Temp: 97.8 F (36.6 C)   98.1 F (36.7 C)  TempSrc: Oral   Oral  SpO2: 100%  98% 96%  Weight:  99.9 kg    Height:        Intake/Output Summary (Last 24 hours) at 09/14/2022 1007 Last data filed at 09/14/2022 0751 Gross per 24 hour  Intake 1200 ml  Output 4200  ml  Net -3000 ml    Filed Weights   09/12/22 1433 09/12/22 1844 09/14/22 0500  Weight: 95.3 kg 99.7 kg 99.9 kg    Examination:  General: Currently on room air.  No distress ENT/neck: No thyromegaly.  JVD is not elevated  respiratory: Decreased breath sounds at bases bilaterally with some basilar crackles; no  wheezing  CVS: S1-S2 heard, rate controlled currently Abdominal: Soft, obese, nontender, slightly distended; no organomegaly, bowel sounds are heard Extremities: Lower extremity 2+ pitting edema bilaterally but slightly improved since yesterday; no cyanosis  CNS: Awake and alert.  No focal neurologic deficit.  Moves extremities Lymph: No obvious lymphadenopathy Skin: No obvious ecchymosis/lesions  psych: Affect, judgment and mood are normal  musculoskeletal: No obvious joint swelling/deformity     Data Reviewed: I have personally reviewed following labs and imaging studies  CBC: Recent Labs  Lab 09/12/22 1424  WBC 6.4  NEUTROABS 5.0  HGB 9.5*  HCT 28.8*  MCV 96.6  PLT 242    Basic Metabolic Panel: Recent Labs  Lab 09/12/22 1424 09/13/22 0023  NA 135 139  K 4.5 4.0  CL 100 104  CO2 25 25  GLUCOSE 284* 290*  BUN 23 17  CREATININE 1.27* 1.22  CALCIUM 9.2 9.7    GFR: Estimated Creatinine Clearance: 61.8 mL/min (by C-G formula based on SCr of 1.22 mg/dL). Liver Function Tests: Recent Labs  Lab 09/12/22 1424 09/13/22 0023  AST 24 20  ALT 21 21  ALKPHOS 49 45  BILITOT 0.6 0.6  PROT 6.4* 5.8*  ALBUMIN 3.9 3.7    No results for input(s): "LIPASE", "AMYLASE" in the last 168 hours. No results for input(s): "AMMONIA" in the last 168 hours. Coagulation Profile: No results for input(s): "INR", "PROTIME" in the last 168 hours. Cardiac Enzymes: No results for input(s): "CKTOTAL", "CKMB", "CKMBINDEX", "TROPONINI" in the last 168 hours. BNP (last 3 results) No results for input(s): "PROBNP" in the last 8760 hours. HbA1C: Recent Labs    09/13/22 0023  HGBA1C 9.9*    CBG: Recent Labs  Lab 09/13/22 1111 09/13/22 1733 09/13/22 1822 09/13/22 2105 09/14/22 0607  GLUCAP 217* 423* 476* 294* 171*    Lipid Profile: No results for input(s): "CHOL", "HDL", "LDLCALC", "TRIG", "CHOLHDL", "LDLDIRECT" in the last 72 hours. Thyroid Function Tests: No results for  input(s): "TSH", "T4TOTAL", "FREET4", "T3FREE", "THYROIDAB" in the last 72 hours. Anemia Panel: No results for input(s): "VITAMINB12", "FOLATE", "FERRITIN", "TIBC", "IRON", "RETICCTPCT" in the last 72 hours. Sepsis Labs: No results for input(s): "PROCALCITON", "LATICACIDVEN" in the last 168 hours.  Recent Results (from the past 240 hour(s))  MRSA Next Gen by PCR, Nasal     Status: None   Collection Time: 09/12/22  6:55 PM   Specimen: Nasal Mucosa; Nasal Swab  Result Value Ref Range Status   MRSA by PCR Next Gen NOT DETECTED NOT DETECTED Final    Comment: (NOTE) The GeneXpert MRSA Assay (FDA approved for NASAL specimens only), is one component of a comprehensive MRSA colonization surveillance program. It is not intended to diagnose MRSA infection nor to guide or monitor treatment for MRSA infections. Test performance is not FDA approved in patients less than 69 years old. Performed at [facility], [address] [place], [place] [address]       Scheduled Meds:  acetaminophen  1,000 mg Oral Daily   acetaminophen  500 mg Oral QHS   apixaban  5 mg Oral BID   baclofen  20 mg Oral  BID   calcium carbonate  1,250 mg Oral Q breakfast   carvedilol  6.25 mg Oral BID WC   folic acid  2 mg Oral Daily   insulin aspart  0-20 Units Subcutaneous TID WC   insulin aspart  0-5 Units Subcutaneous QHS   insulin detemir  30 Units Subcutaneous QHS   latanoprost  1 drop Both Eyes QHS   levothyroxine  75 mcg Oral Daily   liver oil-zinc oxide   Topical BID   pantoprazole  40 mg Oral Daily   predniSONE  15 mg Oral Q breakfast   sertraline  50 mg Oral Daily   sodium chloride flush  3 mL Intravenous Q12H   torsemide  20 mg Oral Daily   Continuous Infusions:        Glade Lloyd, MD [facility] 09/14/2022, 10:07 AM

## 2022-09-14 NOTE — Procedures (Addendum)
Patient Name: [name]  MRN: [id number]  Epilepsy Attending: Charlsie Quest  Referring Physician/Provider: Charlsie Quest, MD  Duration: 09/13/2022 2301 to 09/14/2022 2301  Patient history: 71yo M with ams getting eeg to evaluate for seizure   Level of alertness: Awake, asleep  AEDs during EEG study: None  Technical aspects: This EEG study was done with scalp electrodes positioned according to the 10-20 International system of electrode placement. Electrical activity was reviewed with band pass filter of 1-70Hz , sensitivity of 7 uV/mm, display speed of 59mm/sec with a 60Hz  notched filter applied as appropriate. EEG data were recorded continuously and digitally stored.  Video monitoring was available and reviewed as appropriate.   Description: The posterior dominant rhythm consists of 9-10 Hz activity of moderate voltage (25-35 uV) seen predominantly in posterior head regions, symmetric and reactive to eye opening and eye closing. Sleep was characterized by vertex waves, sleep spindles (12 to 14 Hz), maximal frontocentral region. Hyperventilation and photic stimulation were not performed.     EKG artifact was seen throughout the study.    IMPRESSION: This study is within normal limits. No seizures or epileptiform discharges were seen throughout the recording.   A normal interictal EEG does not exclude the diagnosis of epilepsy.   [name]

## 2022-09-14 NOTE — Progress Notes (Signed)
2 weeks Live Zio ordered per Dr Cristal Deer request, will apply today, Dr Rennis Golden to read. Follow up already made with cardiology on 6/11 per review

## 2022-09-14 NOTE — Evaluation (Addendum)
Physical Therapy Evaluation Patient Details Name: [name] MRN: [id number] DOB: [birth year] Today's Date: 09/14/2022  History of Present Illness  72 year old male presented to ED 5/152024 with loss of consciousness for 1 to 2 minutes with stiffening of whole body, rolling of eyes without visible shaking and bowel or bladder incontinence. CT of brain and MRI of brain did not show any acute intracranial abnormality. Admitted for treatment of acute on chronic combined CHF. ION:[id number] myopathy, rheumatoid arthritis, diabetes mellitus type 2, DVT and PE on Eliquis, chronic combined CHF, nonobstructive CAD, chronic lymphedema, CKD stage IIIa, OSA, GERD, hypothyroidism  Clinical Impression  PTA pt living with wife and son in 2 story home with 5 steps to enter and bed and bath on 2nd floor. Pt reports he has been sleeping in recliner on main floor but wife assists with climbing stairs by moving one LE at a time up the stairs to take a shower every day. Wife assists with bathing and lower body dressing as well as iADLs. Pt use a Rollator for ambulation in home, utilizes wheelchair at doctor's appointments. Pt and wife report numerous and frequent falls. Pt has long standing R LE weakness and neuropathy, also endorses gradual onset of L sided weakness both UE and LE. Pt is currently needing maxAx2 for bed mobility, minAx2 for coming to standing and taking lateral steps along EoB. Pt does not exhibit enough LE strength currently to lift legs step height to get into his home. Patient will benefit from intensive inpatient follow up therapy, >3 hours/day. PT will continue to follow acutely.        Recommendations for follow up therapy are one component of a multi-disciplinary discharge planning process, led by the attending physician.  Recommendations may be updated based on patient status, additional functional criteria and insurance authorization.     Assistance Recommended at Discharge Frequent or constant  Supervision/Assistance  Patient can return home with the following  Two people to help with walking and/or transfers;A lot of help with bathing/dressing/bathroom;Assistance with cooking/housework;Assist for transportation;Help with stairs or ramp for entrance    Equipment Recommendations BSC/3in1;Rollator (4 wheels);Wheelchair (measurements PT);Wheelchair cushion (measurements PT)  Recommendations for Other Services  Rehab consult;OT consult    Functional Status Assessment Patient has had a recent decline in their functional status and demonstrates the ability to make significant improvements in function in a reasonable and predictable amount of time.     Precautions / Restrictions Precautions Precautions: Fall Precaution Comments: numerous falls, as recent as last week      Mobility  Bed Mobility Overal bed mobility: Needs Assistance Bed Mobility: Supine to Sit, Sit to Supine     Supine to sit: Mod assist, HOB elevated Sit to supine: +2 for physical assistance, Max assist   General bed mobility comments: modA for management of RLE across bed, pt able to utilize bed rail to push trunk to upright, requires modA for pad scoot of hips to square to bed and to bring hips to EoB. maxAx 2 for bringing LE back into bed, pt unable to use core strength to keep upright and wife needs to support posterior to keep from falling backwards and manage trunk to bed surface    Transfers Overall transfer level: Needs assistance Equipment used: Rolling walker (2 wheels) Transfers: Sit to/from Stand, Bed to chair/wheelchair/BSC Sit to Stand: Min assist, From elevated surface          Lateral/Scoot Transfers: Min assist General transfer comment: pt unable to push off from bed  sufficiently to come to upright and ends up pulling on RW, PT provided steadying of RW to so pt could pull to standing. Pt able to take lateral steps towards HoB on his R. Increased UE support on RW needed to advance R LE. MinA  provided for steadying, increased fatigue    Ambulation/Gait               General Gait Details: deferred for safety due to weakness and fatigue        Balance Overall balance assessment: Needs assistance Sitting-balance support: Feet supported, No upper extremity supported, Bilateral upper extremity supported, Single extremity supported Sitting balance-Leahy Scale: Fair Sitting balance - Comments: can not reach outside BoS   Standing balance support: Bilateral upper extremity supported, During functional activity, Reliant on assistive device for balance Standing balance-Leahy Scale: Poor Standing balance comment: requires UE support for maintaining balance                             Pertinent Vitals/Pain Pain Assessment Pain Assessment: 0-10 Pain Score: 9  Pain Location: sores in mouth Pain Descriptors / Indicators: Sore, Grimacing Pain Intervention(s): Limited activity within patient's tolerance, Monitored during session, Repositioned    Home Living Family/patient expects to be discharged to:: Private residence Living Arrangements: Spouse/significant other;Children Available Help at Discharge: Family;Available 24 hours/day Type of Home: House Home Access: Stairs to enter Entrance Stairs-Rails: Right Entrance Stairs-Number of Steps: 5 Alternate Level Stairs-Number of Steps: 18 Home Layout: Two level;Bed/bath upstairs Home Equipment: Educational psychologist (4 wheels)      Prior Function Prior Level of Function : Needs assist       Physical Assist : Mobility (physical)     Mobility Comments: Pt uses a rollator, ADLs Comments: wife assists with bathing and lower body dressing     Hand Dominance   Dominant Hand: Right    Extremity/Trunk Assessment   Upper Extremity Assessment Upper Extremity Assessment: LUE deficits/detail LUE Deficits / Details: decreased strength    Lower Extremity Assessment Lower Extremity Assessment: RLE  deficits/detail;LLE deficits/detail RLE Deficits / Details: PROM WFL, hip strength 2+/3, knee extension 3+/5, knee flexion 2+/5, ankle dorsiflexion 2+/5, plantarflexion 2+/5 RLE Sensation: history of peripheral neuropathy RLE Coordination: decreased fine motor LLE Deficits / Details: PROM WFL, strength grossly 3+/5 LLE Sensation: history of peripheral neuropathy (super sensitive on plantar surface of foot) LLE Coordination: decreased fine motor    Cervical / Trunk Assessment Cervical / Trunk Assessment: Other exceptions (hx of back pain)  Communication   Communication: Other (comment) (difficulty due to sores in his mouth)  Cognition Arousal/Alertness: Awake/alert Behavior During Therapy: WFL for tasks assessed/performed Overall Cognitive Status: Within Functional Limits for tasks assessed                                          General Comments General comments (skin integrity, edema, etc.): pt with variable HR at rest. 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 PT [facility] to [facility] to [facility].        Assessment/Plan    PT Assessment Patient needs continued PT services  PT Problem List Decreased strength;Decreased range  of motion;Decreased activity tolerance;Decreased balance;Decreased mobility;Decreased coordination;Decreased safety awareness;Cardiopulmonary status limiting activity;Decreased skin integrity;Pain;Impaired sensation       PT Treatment Interventions DME instruction;Gait training;Stair training;Functional mobility training;Therapeutic activities;Therapeutic exercise;Balance training;Cognitive remediation;Patient/family education    PT Goals (Current goals can be found in the Care Plan section)  Acute Rehab PT Goals Patient Stated  Goal: go home as soon as possible PT Goal Formulation: With patient/family Time For Goal Achievement: 09/28/22 Potential to Achieve Goals: Fair    Frequency Min 1X/week        AM-PAC PT "6 Clicks" Mobility  Outcome Measure Help needed turning from your back to your side while in a flat bed without using bedrails?: A Little Help needed moving from lying on your back to sitting on the side of a flat bed without using bedrails?: A Lot Help needed moving to and from a bed to a chair (including a wheelchair)?: Total Help needed standing up from a chair using your arms (e.g., wheelchair or bedside chair)?: A Little Help needed to walk in hospital room?: Total Help needed climbing 3-5 steps with a railing? : Total 6 Click Score: 11    End of Session Equipment Utilized During Treatment: Gait belt Activity Tolerance: Patient limited by fatigue Patient left: in bed;with call bell/phone within reach;with bed alarm set;with family/visitor present Nurse Communication: Mobility status;Other (comment) (sore spot on bottom, blood noted on chuck pad, pt's wife reports [name] ordered) PT Visit Diagnosis: Unsteadiness on feet (R26.81);Other abnormalities of gait and mobility (R26.89);Repeated falls (R29.6);Muscle weakness (generalized) (M62.81);History of falling (Z91.81);Difficulty in walking, not elsewhere classified (R26.2);Other symptoms and signs involving the nervous system (R29.898);Pain Pain - part of body:  (sores in mouth, L foot with WB)    Time: 4098-1191 PT Time Calculation (min) (ACUTE ONLY): 60 min   Charges:   PT Evaluation $PT Eval Moderate Complexity: 1 Mod PT Treatments $Therapeutic Activity: 38-52 mins        [name] PT, DPT Acute Rehabilitation Services Please use secure chat or  Call Office [phone]   [name] [facility] 09/14/2022, 4:09 PM

## 2022-09-15 DIAGNOSIS — I890 Lymphedema, not elsewhere classified: Secondary | ICD-10-CM

## 2022-09-15 DIAGNOSIS — R569 Unspecified convulsions: Secondary | ICD-10-CM | POA: Diagnosis not present

## 2022-09-15 DIAGNOSIS — R55 Syncope and collapse: Secondary | ICD-10-CM | POA: Diagnosis not present

## 2022-09-15 LAB — GLUCOSE, CAPILLARY
Glucose-Capillary: 147 mg/dL — ABNORMAL HIGH (ref 70–99)
Glucose-Capillary: 475 mg/dL — ABNORMAL HIGH (ref 70–99)

## 2022-09-15 MED ORDER — INSULIN GLARGINE 100 UNIT/ML ~~LOC~~ SOLN: 38.0000 [IU] | Freq: Every day | SUBCUTANEOUS | Status: AC

## 2022-09-15 MED ORDER — PANTOPRAZOLE SODIUM 40 MG PO TBEC: 40.0000 mg | DELAYED_RELEASE_TABLET | Freq: Every day | ORAL | Status: DC

## 2022-09-15 MED ORDER — SERTRALINE HCL 25 MG PO TABS
50.0000 mg | ORAL_TABLET | Freq: Every day | ORAL | Status: AC
Start: 2022-09-15 — End: ?

## 2022-09-15 NOTE — Progress Notes (Signed)
LTM EEG discontinued - skin breakdown noted at [facility]. Pt and his wife were really upset with Technologist (they stated by name) and who performed the original EEG application and agressive attitude while training a student. They stated they had no issues with night Technologist who performed maintenance or myself who performed D/C today. They requested my Directors name and said they would be contacting them to file a complaint. I apologized on behalf of our team and re-assured them as possible.

## 2022-09-15 NOTE — Progress Notes (Signed)
Pt to be DC today. Met with pt to discuss HH. Pt prefers to return home with the support of his wife and son. He needs a rollator. Discussed with pt preference for a [facility] agency. Pt reports that he doesn't have a preference for an agency. Provided pt with a Medicare.gov HH rating list. He asked that I talk to his wife to discuss HH. Pt called his wife. Wife reports that she used a HH agency in the past, but she can't remember the name of the agency. Discussed with wife the Medicare [facility] rating list. She prefers to use [facility] (formerly known [facility] She reports that pt needs a bedside commode. Contacted Amy with [facility] and she accepted the referral for [facility] PT/OT. Contacted Jasmine with [facility] for DME referral and she accepted the referral for the rollator and 3-in-1 BSC.

## 2022-09-15 NOTE — Progress Notes (Signed)
LTM maint complete - no skin breakdown under: Fp1 Fp2  Patient stated the leads on forehead very itchy, Tech placed skin saver barrier on leads and reapplied. [facility] monitored, Event button test confirmed by [facility].

## 2022-09-15 NOTE — Progress Notes (Signed)
Inpatient Rehab Admissions Coordinator:  Consult received. Note pt going home with HH. Confirmed with TOC. AC will sign off.  Wolfgang Phoenix, MS, CCC-SLP Admissions Coordinator [phone]

## 2022-09-15 NOTE — Procedures (Addendum)
Patient Name: Veron Posso  MRN: [id number]  Epilepsy Attending: Charlsie Quest  Referring Physician/Provider: Charlsie Quest, MD  Duration: 09/14/2022 2301 to 09/15/2022 0900   Patient history: 71yo M with ams getting eeg to evaluate for seizure    Level of alertness: Awake, asleep   AEDs during EEG study: None   Technical aspects: This EEG study was done with scalp electrodes positioned according to the 10-20 International system of electrode placement. Electrical activity was reviewed with band pass filter of 1-70Hz , sensitivity of 7 uV/mm, display speed of 32mm/sec with a 60Hz  notched filter applied as appropriate. EEG data were recorded continuously and digitally stored.  Video monitoring was available and reviewed as appropriate.   Description: The posterior dominant rhythm consists of 9-10 Hz activity of moderate voltage (25-35 uV) seen predominantly in posterior head regions, symmetric and reactive to eye opening and eye closing. Sleep was characterized by vertex waves, sleep spindles (12 to 14 Hz), maximal frontocentral region. Hyperventilation and photic stimulation were not performed.      IMPRESSION: This study is within normal limits. No seizures or epileptiform discharges were seen throughout the recording.   A normal interictal EEG does not exclude the diagnosis of epilepsy.   Jameir Ake Annabelle Harman

## 2022-09-15 NOTE — Progress Notes (Signed)
    Durable Medical Equipment  (From admission, onward)           Start     Ordered   09/15/22 1057  For home use only DME Bedside commode  Once       Question:  Patient needs a bedside commode to treat with the following condition  Answer:  Weakness   09/15/22 1057   09/15/22 1056  For home use only DME 4 wheeled rolling walker with seat  Once       Question:  Patient needs a walker to treat with the following condition  Answer:  Weakness   09/15/22 1057

## 2022-09-15 NOTE — Plan of Care (Signed)
  Problem: Health Behavior/Discharge Planning: Goal: Ability to manage health-related needs will improve Outcome: Adequate for Discharge   Problem: Clinical Measurements: Goal: Ability to maintain clinical measurements within normal limits will improve Outcome: Adequate for Discharge Goal: Will remain free from infection Outcome: Adequate for Discharge Goal: Diagnostic test results will improve Outcome: Adequate for Discharge Goal: Respiratory complications will improve Outcome: Adequate for Discharge Goal: Cardiovascular complication will be avoided Outcome: Adequate for Discharge   Problem: Activity: Goal: Risk for activity intolerance will decrease Outcome: Adequate for Discharge   Problem: Nutrition: Goal: Adequate nutrition will be maintained Outcome: Adequate for Discharge   Problem: Coping: Goal: Level of anxiety will decrease Outcome: Adequate for Discharge   Problem: Elimination: Goal: Will not experience complications related to bowel motility Outcome: Adequate for Discharge Goal: Will not experience complications related to urinary retention Outcome: Adequate for Discharge   Problem: Pain Managment: Goal: General experience of comfort will improve Outcome: Adequate for Discharge   Problem: Safety: Goal: Ability to remain free from injury will improve Outcome: Adequate for Discharge   Problem: Skin Integrity: Goal: Risk for impaired skin integrity will decrease Outcome: Adequate for Discharge   Problem: Education: Goal: Knowledge of condition and prescribed therapy will improve Outcome: Adequate for Discharge   Problem: Cardiac: Goal: Will achieve and/or maintain adequate cardiac output Outcome: Adequate for Discharge   Problem: Physical Regulation: Goal: Complications related to the disease process, condition or treatment will be avoided or minimized Outcome: Adequate for Discharge   Problem: Education: Goal: Ability to describe self-care measures  that may prevent or decrease complications (Diabetes Survival Skills Education) will improve Outcome: Adequate for Discharge Goal: Individualized Educational Video(s) Outcome: Adequate for Discharge   Problem: Coping: Goal: Ability to adjust to condition or change in health will improve Outcome: Adequate for Discharge   Problem: Fluid Volume: Goal: Ability to maintain a balanced intake and output will improve Outcome: Adequate for Discharge   Problem: Health Behavior/Discharge Planning: Goal: Ability to identify and utilize available resources and services will improve Outcome: Adequate for Discharge Goal: Ability to manage health-related needs will improve Outcome: Adequate for Discharge   Problem: Metabolic: Goal: Ability to maintain appropriate glucose levels will improve Outcome: Adequate for Discharge   Problem: Nutritional: Goal: Maintenance of adequate nutrition will improve Outcome: Adequate for Discharge Goal: Progress toward achieving an optimal weight will improve Outcome: Adequate for Discharge   Problem: Skin Integrity: Goal: Risk for impaired skin integrity will decrease Outcome: Adequate for Discharge   Problem: Tissue Perfusion: Goal: Adequacy of tissue perfusion will improve Outcome: Adequate for Discharge

## 2022-09-15 NOTE — Plan of Care (Signed)
Problem: Health Behavior/Discharge Planning: Goal: Ability to manage health-related needs will improve 09/15/2022 1318 by Garth Bigness, RN Outcome: Adequate for Discharge 09/15/2022 1318 by Garth Bigness, RN Outcome: Adequate for Discharge 09/15/2022 0957 by Garth Bigness, RN Outcome: Adequate for Discharge   Problem: Clinical Measurements: Goal: Ability to maintain clinical measurements within normal limits will improve 09/15/2022 1318 by Garth Bigness, RN Outcome: Adequate for Discharge 09/15/2022 1318 by Garth Bigness, RN Outcome: Adequate for Discharge 09/15/2022 0957 by Garth Bigness, RN Outcome: Adequate for Discharge Goal: Will remain free from infection 09/15/2022 1318 by Garth Bigness, RN Outcome: Adequate for Discharge 09/15/2022 1318 by Garth Bigness, RN Outcome: Adequate for Discharge 09/15/2022 0957 by Garth Bigness, RN Outcome: Adequate for Discharge Goal: Diagnostic test results will improve 09/15/2022 1318 by Garth Bigness, RN Outcome: Adequate for Discharge 09/15/2022 1318 by Garth Bigness, RN Outcome: Adequate for Discharge 09/15/2022 0957 by Garth Bigness, RN Outcome: Adequate for Discharge Goal: Respiratory complications will improve 09/15/2022 1318 by Garth Bigness, RN Outcome: Adequate for Discharge 09/15/2022 1318 by Garth Bigness, RN Outcome: Adequate for Discharge 09/15/2022 0957 by Garth Bigness, RN Outcome: Adequate for Discharge Goal: Cardiovascular complication will be avoided 09/15/2022 1318 by Garth Bigness, RN Outcome: Adequate for Discharge 09/15/2022 1318 by Garth Bigness, RN Outcome: Adequate for Discharge 09/15/2022 0957 by Garth Bigness, RN Outcome: Adequate for Discharge   Problem: Activity: Goal: Risk for activity intolerance will decrease 09/15/2022 1318 by Garth Bigness, RN Outcome: Adequate for Discharge 09/15/2022 1318 by Garth Bigness, RN Outcome: Adequate for  Discharge 09/15/2022 0957 by Garth Bigness, RN Outcome: Adequate for Discharge   Problem: Nutrition: Goal: Adequate nutrition will be maintained 09/15/2022 1318 by Garth Bigness, RN Outcome: Adequate for Discharge 09/15/2022 1318 by Garth Bigness, RN Outcome: Adequate for Discharge 09/15/2022 0957 by Garth Bigness, RN Outcome: Adequate for Discharge   Problem: Coping: Goal: Level of anxiety will decrease 09/15/2022 1318 by Garth Bigness, RN Outcome: Adequate for Discharge 09/15/2022 1318 by Garth Bigness, RN Outcome: Adequate for Discharge 09/15/2022 0957 by Garth Bigness, RN Outcome: Adequate for Discharge   Problem: Elimination: Goal: Will not experience complications related to bowel motility 09/15/2022 1318 by Garth Bigness, RN Outcome: Adequate for Discharge 09/15/2022 1318 by Garth Bigness, RN Outcome: Adequate for Discharge 09/15/2022 0957 by Garth Bigness, RN Outcome: Adequate for Discharge Goal: Will not experience complications related to urinary retention 09/15/2022 1318 by Garth Bigness, RN Outcome: Adequate for Discharge 09/15/2022 1318 by Garth Bigness, RN Outcome: Adequate for Discharge 09/15/2022 0957 by Garth Bigness, RN Outcome: Adequate for Discharge   Problem: Pain Managment: Goal: General experience of comfort will improve 09/15/2022 1318 by Garth Bigness, RN Outcome: Adequate for Discharge 09/15/2022 1318 by Garth Bigness, RN Outcome: Adequate for Discharge 09/15/2022 0957 by Garth Bigness, RN Outcome: Adequate for Discharge   Problem: Safety: Goal: Ability to remain free from injury will improve 09/15/2022 1318 by Garth Bigness, RN Outcome: Adequate for Discharge 09/15/2022 1318 by Garth Bigness, RN Outcome: Adequate for Discharge 09/15/2022 0957 by Garth Bigness, RN Outcome: Adequate for Discharge   Problem: Skin Integrity: Goal: Risk for impaired skin integrity will  decrease 09/15/2022 1318 by Garth Bigness, RN Outcome: Adequate for Discharge 09/15/2022 1318 by Garth Bigness, RN Outcome: Adequate for Discharge 09/15/2022 0957 by Sandrea [name]  L, RN Outcome: Adequate for Discharge   Problem: Education: Goal: Knowledge of condition and prescribed therapy will improve 09/15/2022 1318 by Garth Bigness, RN Outcome: Adequate for Discharge 09/15/2022 1318 by Garth Bigness, RN Outcome: Adequate for Discharge 09/15/2022 0957 by Garth Bigness, RN Outcome: Adequate for Discharge   Problem: Cardiac: Goal: Will achieve and/or maintain adequate cardiac output 09/15/2022 1318 by Garth Bigness, RN Outcome: Adequate for Discharge 09/15/2022 1318 by Garth Bigness, RN Outcome: Adequate for Discharge 09/15/2022 0957 by Garth Bigness, RN Outcome: Adequate for Discharge   Problem: Physical Regulation: Goal: Complications related to the disease process, condition or treatment will be avoided or minimized 09/15/2022 1318 by Garth Bigness, RN Outcome: Adequate for Discharge 09/15/2022 1318 by Garth Bigness, RN Outcome: Adequate for Discharge 09/15/2022 0957 by Garth Bigness, RN Outcome: Adequate for Discharge   Problem: Education: Goal: Ability to describe self-care measures that may prevent or decrease complications (Diabetes Survival Skills Education) will improve 09/15/2022 1318 by Garth Bigness, RN Outcome: Adequate for Discharge 09/15/2022 1318 by Garth Bigness, RN Outcome: Adequate for Discharge 09/15/2022 0957 by Garth Bigness, RN Outcome: Adequate for Discharge Goal: Individualized Educational Video(s) 09/15/2022 1318 by Garth Bigness, RN Outcome: Adequate for Discharge 09/15/2022 1318 by Garth Bigness, RN Outcome: Adequate for Discharge 09/15/2022 0957 by Garth Bigness, RN Outcome: Adequate for Discharge   Problem: Coping: Goal: Ability to adjust to condition or change in health will  improve 09/15/2022 1318 by Garth Bigness, RN Outcome: Adequate for Discharge 09/15/2022 1318 by Garth Bigness, RN Outcome: Adequate for Discharge 09/15/2022 0957 by Garth Bigness, RN Outcome: Adequate for Discharge   Problem: Fluid Volume: Goal: Ability to maintain a balanced intake and output will improve 09/15/2022 1318 by Garth Bigness, RN Outcome: Adequate for Discharge 09/15/2022 1318 by Garth Bigness, RN Outcome: Adequate for Discharge 09/15/2022 0957 by Garth Bigness, RN Outcome: Adequate for Discharge   Problem: Health Behavior/Discharge Planning: Goal: Ability to identify and utilize available resources and services will improve 09/15/2022 1318 by Garth Bigness, RN Outcome: Adequate for Discharge 09/15/2022 0957 by Garth Bigness, RN Outcome: Adequate for Discharge Goal: Ability to manage health-related needs will improve 09/15/2022 1318 by Garth Bigness, RN Outcome: Adequate for Discharge 09/15/2022 0957 by Garth Bigness, RN Outcome: Adequate for Discharge   Problem: Metabolic: Goal: Ability to maintain appropriate glucose levels will improve 09/15/2022 1318 by Garth Bigness, RN Outcome: Adequate for Discharge 09/15/2022 0957 by Garth Bigness, RN Outcome: Adequate for Discharge   Problem: Nutritional: Goal: Maintenance of adequate nutrition will improve 09/15/2022 1318 by Garth Bigness, RN Outcome: Adequate for Discharge 09/15/2022 0957 by Garth Bigness, RN Outcome: Adequate for Discharge Goal: Progress toward achieving an optimal weight will improve 09/15/2022 1318 by Garth Bigness, RN Outcome: Adequate for Discharge 09/15/2022 0957 by Garth Bigness, RN Outcome: Adequate for Discharge   Problem: Skin Integrity: Goal: Risk for impaired skin integrity will decrease 09/15/2022 1318 by Garth Bigness, RN Outcome: Adequate for Discharge 09/15/2022 0957 by Garth Bigness, RN Outcome: Adequate for Discharge    Problem: Tissue Perfusion: Goal: Adequacy of tissue perfusion will improve 09/15/2022 1318 by Garth Bigness, RN Outcome: Adequate for Discharge 09/15/2022 0957 by Garth Bigness, RN Outcome: Adequate for Discharge

## 2022-09-15 NOTE — Evaluation (Signed)
Occupational Therapy Evaluation Patient Details Name: [name] MRN: [id number] DOB: 1950-08-19 Today's Date: 09/15/2022   History of Present Illness 72 year old male presented to ED 5/152024 with loss of consciousness for 1 to 2 minutes with stiffening of whole body, rolling of eyes without visible shaking and bowel or bladder incontinence. CT of brain and MRI of brain did not show any acute intracranial abnormality. Admitted for treatment of acute on chronic combined CHF. WUJ:[id number] myopathy, rheumatoid arthritis, diabetes mellitus type 2, DVT and PE on Eliquis, chronic combined CHF, nonobstructive CAD, chronic lymphedema, CKD stage IIIa, OSA, GERD, hypothyroidism   Clinical Impression   PTA, pt lives with family who can provide 24/7 assist. Pt typically ambulatory with Rollator though has close assist with this, as well as bed mobility and stair mgmt. Per wife, pt typically able to manage ADLs though she provides assist as needed. Pt presents now with baseline deficits in memory, as well as deficits in strength, standing balance and endurance. Pt requires Min-Mod A to stand from various surface heights but once up, able to mobilize in hallway with min guard using Rollator. Pt requires Setup for UB ADL and up to Max A for LB ADLs. Pt and wife comfortable with DC home, interested in [facility] services to further address pt deficits.       Recommendations for follow up therapy are one component of a multi-disciplinary discharge planning process, led by the attending physician.  Recommendations may be updated based on patient status, additional functional criteria and insurance authorization.   Assistance Recommended at Discharge Frequent or constant Supervision/Assistance  Patient can return home with the following A lot of help with walking and/or transfers;A lot of help with bathing/dressing/bathroom;Assistance with cooking/housework    Functional Status Assessment  Patient has had a recent  decline in their functional status and demonstrates the ability to make significant improvements in function in a reasonable and predictable amount of time.  Equipment Recommendations  BSC/3in1;Other (comment) (Wife requesting SCD consideration for BLE edema)    Recommendations for Other Services       Precautions / Restrictions Precautions Precautions: Fall Precaution Comments: numerous falls, as recent as last week Restrictions Weight Bearing Restrictions: No      Mobility Bed Mobility Overal bed mobility: Needs Assistance Bed Mobility: Supine to Sit     Supine to sit: Mod assist, HOB elevated     General bed mobility comments: assist for RLE to EOB and lifting trunk, able to use bedrails well    Transfers Overall transfer level: Needs assistance Equipment used: Rollator (4 wheels) Transfers: Sit to/from Stand Sit to Stand: Mod assist, Min assist           General transfer comment: Mod A from lower bed, MIn A from Rollator      Balance Overall balance assessment: Needs assistance Sitting-balance support: Feet supported, No upper extremity supported, Bilateral upper extremity supported, Single extremity supported Sitting balance-Leahy Scale: Fair     Standing balance support: Bilateral upper extremity supported, During functional activity, Reliant on assistive device for balance Standing balance-Leahy Scale: Poor                             ADL either performed or assessed with clinical judgement   ADL Overall ADL's : Needs assistance/impaired Eating/Feeding: Independent   Grooming: Min guard;Standing   Upper Body Bathing: Set up;Sitting   Lower Body Bathing: Moderate assistance;Sit to/from stand   Upper Body Dressing :  Set up;Sitting   Lower Body Dressing: Moderate assistance;Sit to/from stand   Toilet Transfer: Min guard;Minimal assistance;Ambulation;Rollator (4 wheels)   Toileting- Architect and Hygiene: Moderate  assistance;Sit to/from stand;Sitting/lateral lean       Functional mobility during ADLs: Minimal assistance;Min guard;Rollator (4 wheels) General ADL Comments: RLE shakiness with progressive mobility, some command following difficulties with fatigue and increased risk for falls     Vision Ability to See in Adequate Light: 0 Adequate Patient Visual Report: No change from baseline Vision Assessment?: No apparent visual deficits     Perception     Praxis      Pertinent Vitals/Pain Pain Assessment Pain Assessment: No/denies pain Faces Pain Scale: No hurt Pain Intervention(s): Monitored during session     Hand Dominance Right   Extremity/Trunk Assessment Upper Extremity Assessment Upper Extremity Assessment: Overall WFL for tasks assessed   Lower Extremity Assessment Lower Extremity Assessment: Defer to PT evaluation   Cervical / Trunk Assessment Cervical / Trunk Assessment: Normal   Communication Communication Communication: No difficulties   Cognition Arousal/Alertness: Awake/alert Behavior During Therapy: WFL for tasks assessed/performed Overall Cognitive Status: History of cognitive impairments - at baseline                                 General Comments: hx of STM deficits, some command following difficulty when using Rollator for seated rest break, directional cues though may be due to fatigue     General Comments  Wife and son at bedside    Exercises     Shoulder Instructions      Home Living Family/patient expects to be discharged to:: Private residence Living Arrangements: Spouse/significant other;Children Available Help at Discharge: Family;Available 24 hours/day Type of Home: House Home Access: Stairs to enter [employer] of Steps: 5 Entrance Stairs-Rails: Right Home Layout: Two level;Bed/bath upstairs Alternate Level Stairs-Number of Steps: 18   Bathroom Shower/Tub: Producer, television/film/video: Standard      Home Equipment: Educational psychologist (4 wheels);Wheelchair - manual          Prior Functioning/Environment Prior Level of Function : Needs assist             Mobility Comments: Pt uses a rollator, frequent falls, assist for stair mgmt ADLs Comments: Per wife, pt typically able to bathe/dress self but wife will assist as needed.        OT Problem List: Decreased strength;Decreased activity tolerance;Impaired balance (sitting and/or standing);Decreased cognition;Decreased safety awareness;Decreased knowledge of use of DME or AE      OT Treatment/Interventions: Self-care/ADL training;Therapeutic exercise;Energy conservation;DME and/or AE instruction;Therapeutic activities;Patient/family education;Balance training    OT Goals(Current goals can be found in the care plan section) Acute Rehab OT Goals Patient Stated Goal: home today OT Goal Formulation: With patient/family Time For Goal Achievement: 09/29/22 Potential to Achieve Goals: Good  OT Frequency: Min 2X/week    Co-evaluation              AM-PAC OT "6 Clicks" Daily Activity     Outcome Measure Help from another person eating meals?: None Help from another person taking care of personal grooming?: A Little Help from another person toileting, which includes using toliet, bedpan, or urinal?: A Lot Help from another person bathing (including washing, rinsing, drying)?: A Lot Help from another person to put on and taking off regular upper body clothing?: A Little Help from another person to put on and taking  off regular lower body clothing?: A Lot 6 Click Score: 16   End of Session Equipment Utilized During Treatment: Gait belt;Rollator (4 wheels) Nurse Communication: Mobility status  Activity Tolerance: Patient tolerated treatment well Patient left: in bed;with call bell/phone within reach;with bed alarm set;with family/visitor present  OT Visit Diagnosis: Unsteadiness on feet (R26.81);Other abnormalities of gait  and mobility (R26.89);Muscle weakness (generalized) (M62.81)                Time: 4098-1191 OT Time Calculation (min): 27 min Charges:  OT General Charges $OT Visit: 1 Visit OT Evaluation $OT Eval Moderate Complexity: 1 Mod OT Treatments $Self Care/Home Management : 8-22 mins  Bradd Canary, OTR/L Acute Rehab Services Office: [phone]   Lorre Munroe 09/15/2022, 1:05 PM

## 2022-09-15 NOTE — Discharge Summary (Signed)
Physician Discharge Summary  [place] Art ZOX:[id number] DOB: 07-21-1950 DOA: 09/12/2022  PCP: Everrett Coombe, DO  Admit date: 09/12/2022 Discharge date: 09/15/2022  Admitted From: Home Disposition: Home  Recommendations for Outpatient Follow-up:  Follow up with PCP in 1 week with repeat CBC/BMP Outpatient follow-up with neurology and cardiology Might benefit from outpatient neurosurgery evaluation Follow up in ED if symptoms worsen or new appear   Home Health: Home with PT/OT.  PT recommended CIR placement.  Patient not interested in CIR placement Equipment/Devices: None  Discharge Condition: Stable CODE STATUS: Full Diet recommendation: Heart healthy/carb modified/fluid restriction of up to 1500 cc a day  Brief/Interim Summary: 72 year old male with history of autoimmune myopathy, rheumatoid arthritis, diabetes mellitus type 2, DVT and PE on Eliquis, chronic combined CHF, nonobstructive CAD, chronic lymphedema, CKD stage IIIa, OSA, GERD, hypothyroidism presented with loss of consciousness for 1 to 2 minutes with stiffening of whole body, rolling of eyes without visible shaking and bowel or bladder incontinence. She also has increased leg swelling despite being on increased dose of torsemide as per cardiology as an outpatient. On presentation, creatinine was at baseline. CT of brain and MRI of brain did not show any acute intracranial abnormality.  Neurology and cardiology were consulted.  EEG and LTM EEG have remained negative so far.  Cardiology recommended to continue current doses of diuretics and follow with cardiology as an outpatient.  They have also arranged for 2 weeks of live ZIO.  Neurology and cardiology have cleared him for discharge.  He will be discharged home today with outpatient follow-up with PCP/cardiology and neurology.  Discharge Diagnoses:   Episode of loss of consciousness -Concern for seizure versus syncope.  Second episode of loss of consciousness with stiffening of  the body and up rolling of eyes without visible body shaking within the last 2 weeks -CT of brain and MRI of brain did not show any acute intracranial abnormality. -EEG was negative for seizures.  Neurology evaluated and followed the patient during this hospitalization.  LTM EEG negative for seizures as well.  Neurology has cleared the patient for discharge: No need for any antiseizure medications on discharge -Mental status currently stable.   -PT recommended CIR placement.  Patient refused CIR placement.  Will need home health PT/OT  -Patient had MRI of cervical/thoracic and lumbar spine as per neurology recommendations which showed various degrees of foraminal stenosis.  Continue PT eval.  Might benefit from outpatient neurosurgery evaluation  acute on chronic combined CHF Hypertension -Has been on increased dose of torsemide 20 mg daily as an outpatient as per cardiology recommendations. -Has had increased leg and abdominal swelling with weeping of right lower extremity. -2D echo showed EF of 60 to 65%.  Diet and fluid restriction.  Cardiology evaluation appreciated: Recommended to continue torsemide 20 mg daily on discharge.  Outpatient follow-up with cardiology.  Continue Coreg.   -Patient received 2 doses of IV Lasix during this hospitalization and had good diuresis: Negative balance of 6240 cc since admission.  Lower extremity swelling has much improved.   Chronic kidney disease stage IIIa -Creatinine currently stable.  Monitor.  No labs today.  Outpatient follow-up  History of DVT and PE -Continue Eliquis   Chronic lymphedema -Follows up with lymphedema team as an outpatient and has compression stockings as an outpatient.   Diabetes mellitus type 2 with hyperglycemia -A1c 9.9.  Continue home regimen.  Continue carb modified diet.  Blood sugars have been elevated during this hospitalization.  Outpatient follow-up with PCP.  Obesity -Outpatient follow-up   Autoimmune myopathy and  history of rheumatoid arthritis -Follows up with rheumatology as an outpatient.  Continue prednisone.  Resume methotrexate on discharge.     Anemia of chronic disease -From chronic illnesses.  Hemoglobin stable.  Monitor intermittently as an outpatient.   Hypothyroidism -Continue Synthroid   GERD -Continue Protonix  Discharge Instructions  Discharge Instructions     Diet - low sodium heart healthy   Complete by: As directed    Diet Carb Modified   Complete by: As directed    Increase activity slowly   Complete by: As directed    No wound care   Complete by: As directed       Allergies as of 09/15/2022       Reactions   Benazepril Hives, Cough   Gabapentin Swelling   Hydrocodone Hives, Itching   Shellfish Allergy Diarrhea, Nausea And Vomiting        Medication List     STOP taking these medications    ipratropium-albuterol 0.5-2.5 (3) MG/3ML Soln Commonly known as: DUONEB   PRESCRIPTION MEDICATION   testosterone cypionate 200 MG/ML injection Commonly known as: DEPOTESTOSTERONE CYPIONATE       TAKE these medications    acarbose 100 MG tablet Commonly known as: PRECOSE Take 100 mg by mouth 3 (three) times daily with meals.   acetaminophen 500 MG tablet Commonly known as: TYLENOL Take 500-1,000 mg by mouth See admin instructions. Take 1000 mg by mouth in the morning and 500 mg at bedtime   AMBULATORY NON FORMULARY MEDICATION Please provide 3 in 1 bedside commode.  Dx:R29.6, M62.81   AMBULATORY NON FORMULARY MEDICATION Please provide bilateral thigh high compression stockings: Graded compression of 20-5mmhg   apixaban 5 MG Tabs tablet Commonly known as: ELIQUIS Take 1 tablet (5 mg total) by mouth 2 (two) times daily.   baclofen 20 MG tablet Commonly known as: LIORESAL TAKE 1 TABLET BY MOUTH TWICE A DAY   calcium carbonate 600 MG Tabs tablet Commonly known as: OS-CAL Take 600 mg by mouth daily.   carvedilol 6.25 MG tablet Commonly known as:  COREG Take 1 tablet (6.25 mg total) by mouth 2 (two) times daily.   folic acid 1 MG tablet Commonly known as: FOLVITE Take 2 mg by mouth daily.   insulin glargine 100 UNIT/ML injection Commonly known as: LANTUS Inject 0.38 mLs (38 Units total) into the skin daily.   latanoprost 0.005 % ophthalmic solution Commonly known as: XALATAN 1 drop at bedtime.   Levothyroxine Sodium 75 MCG Caps Take 75 mcg by mouth daily.   loratadine 10 MG tablet Commonly known as: CLARITIN Take 10 mg by mouth daily.   methotrexate 5 MG tablet Commonly known as: RHEUMATREX Take 3 tablets by mouth once a week. Caution: Chemotherapy. Protect from light.   pantoprazole 40 MG tablet Commonly known as: PROTONIX Take 1 tablet (40 mg total) by mouth daily.   polyvinyl alcohol 1.4 % ophthalmic solution Commonly known as: LIQUIFILM TEARS Place 1 drop into both eyes in the morning and at bedtime.   predniSONE 10 MG tablet Commonly known as: DELTASONE Take 10 mg by mouth daily with breakfast. Take with 5 mg tablet to equal 15 mg   predniSONE 5 MG tablet Commonly known as: DELTASONE Take 5 mg by mouth daily with breakfast. Take with 10 mg to make a total of 15 mg   sertraline 25 MG tablet Commonly known as: ZOLOFT Take 2 tablets (50 mg total) by mouth  daily. Take 50mg  daily.   torsemide 20 MG tablet Commonly known as: DEMADEX Take 1 tablet (20 mg total) by mouth daily. May take additional tablet as directed by lymphedema clinic or cardiologist.   TUMS PO Take 3 tablets by mouth 2 (two) times daily as needed (heartburn).        Follow-up Information     Alver Sorrow, NP. Go on 10/09/2022.   Specialty: Cardiology Why: Appointment 10/09/22 at 10:05 with Gillian Shields, NP at our Heart & Vascular office in [facility] on [address]. Office is on the second floor. Contact information: [address] [place] [address] [phone]         Everrett Coombe, DO.  Schedule an appointment as soon as possible for a visit in 1 week(s).   Specialty: Family Medicine Contact information: [address]  [address] [place] [address] [phone]                Allergies  Allergen Reactions   Benazepril Hives and Cough   Gabapentin Swelling   Hydrocodone Hives and Itching   Shellfish Allergy Diarrhea and Nausea And Vomiting    Consultations: Cardiology/neurology   Procedures/Studies: Overnight EEG with video  Result Date: 09/14/2022 Charlsie Quest, MD     09/15/2022  7:56 AM Patient Name: Wylie Bruski MRN: [id number] Epilepsy Attending: Charlsie Quest Referring Physician/Provider: Charlsie Quest, MD Duration: 09/13/2022 2301 to 09/14/2022 2301 Patient history: 71yo M with ams getting eeg to evaluate for seizure Level of alertness: Awake, asleep AEDs during EEG study: None Technical aspects: This EEG study was done with scalp electrodes positioned according to the 10-20 International system of electrode placement. Electrical activity was reviewed with band pass filter of 1-70Hz , sensitivity of 7 uV/mm, display speed of 17mm/sec with a 60Hz  notched filter applied as appropriate. EEG data were recorded continuously and digitally stored.  Video monitoring was available and reviewed as appropriate.  Description: The posterior dominant rhythm consists of 9-10 Hz activity of moderate voltage (25-35 uV) seen predominantly in posterior head regions, symmetric and reactive to eye opening and eye closing. Sleep was characterized by vertex waves, sleep spindles (12 to 14 Hz), maximal frontocentral region. Hyperventilation and photic stimulation were not performed.   EKG artifact was seen throughout the study.  IMPRESSION: This study is within normal limits. No seizures or epileptiform discharges were seen throughout the recording.  A normal interictal EEG does not exclude the diagnosis of epilepsy.  Charlsie Quest    MR CERVICAL SPINE WO  CONTRAST  Result Date: 09/13/2022 CLINICAL DATA:  Initial evaluation for radiculopathy. EXAM: MRI CERVICAL, THORACIC AND LUMBAR SPINE WITHOUT CONTRAST TECHNIQUE: Multiplanar and multiecho pulse sequences of the cervical spine, to include the craniocervical junction and cervicothoracic junction, and thoracic and lumbar spine, were obtained without intravenous contrast. COMPARISON:  Prior CT from 08/18/2022. FINDINGS: MRI CERVICAL SPINE FINDINGS Alignment: Straightening of the normal cervical lordosis. 3 mm retrolisthesis of C3 on C4 and C5 on C6, with trace 2 mm retrolisthesis of C2 on C3 and C4 on C5. Vertebrae: Vertebral body height maintained without acute or chronic fracture. Bone marrow signal intensity within normal limits. No discrete or worrisome osseous lesions or abnormal marrow edema. Cord: Normal signal and morphology. No convincing cord signal changes. Posterior Fossa, vertebral arteries, paraspinal tissues: Visualized brain and posterior fossa within normal limits. Craniocervical junction normal. Paraspinous soft tissues within normal limits. Loss of normal flow void within the left vertebral artery noted,  likely occluded. Preserved flow void is seen within the distal left V4 segment on prior brain MRI. Disc levels: C2-C3: Disc bulge with bilateral uncovertebral spurring, worse on the right. Right-sided facet arthrosis. No significant spinal stenosis. Moderate right worse than left C3 foraminal narrowing. C3-C4: Degenerative intervertebral disc space narrowing with diffuse disc bulge and uncovertebral spurring. Superimposed moderate-sized right paracentral disc protrusion indents and flattens the ventral thecal sac (series 5, image 17). Secondary cord flattening without convincing cord signal changes. Mild right-sided facet hypertrophy. Resultant severe spinal stenosis with the thecal sac measuring 4 mm in AP diameter. Severe right worse than left C4 foraminal stenosis. C4-C5: Left paracentral disc  protrusion flattens and partially effaces the ventral thecal sac. Mild cord flattening without cord signal changes. Moderate right with mild left facet hypertrophy with mild uncovertebral spurring. Moderate spinal stenosis. Moderate bilateral C5 foraminal narrowing. C5-C6: Degenerative vertebral disc space narrowing with diffuse disc osteophyte complex. Broad posterior component flattens and effaces the ventral thecal sac. Secondary cord flattening without cord signal changes. Superimposed facet hypertrophy. Resultant severe spinal stenosis with the thecal sac measuring 6 mm in AP diameter. Severe bilateral C6 foraminal narrowing. C6-C7: Left paracentral disc protrusion indents the ventral thecal sac, contacting the ventral cord (series 5, image 42). Mild cord flattening without cord signal changes. Mild right-sided facet arthrosis with mild uncovertebral spurring. Resultant moderate spinal stenosis. Moderate bilateral C7 foraminal narrowing. C7-T1: Tiny left paracentral disc protrusion minimally flattens the left ventral thecal sac. Mild right-sided facet hypertrophy. No spinal stenosis. Mild right C8 foraminal narrowing. Left neural foramina remains patent. MRI THORACIC SPINE FINDINGS Alignment: Mild exaggeration of the normal thoracic kyphosis. No listhesis. Vertebrae: Vertebral body height maintained without acute or chronic fracture. Bone marrow signal intensity within normal limits. No worrisome osseous lesions or abnormal marrow edema. Cord:  Normal signal and morphology. Paraspinal and other soft tissues: Small layering bilateral pleural effusions noted. Otherwise unremarkable. Disc levels: T1-2: Tiny left paracentral disc protrusion minimally indents the ventral thecal sac. Mild facet hypertrophy. No stenosis. T2-3: Negative interspace. Right greater than left facet hypertrophy. No spinal stenosis. Mild right foraminal narrowing. Left neural foramen remains patent. T3-4: Negative interspace. Mild facet  hypertrophy. No spinal stenosis. Mild right foraminal narrowing. Left neural foramina remains patent. T4-5: Negative interspace. Mild facet hypertrophy. No spinal stenosis. Moderate left foraminal narrowing. Right neural foramina remains patent. T5-6: Right paracentral to foraminal disc protrusion with minimal endplate spurring. Mild right greater left facet hypertrophy. No significant spinal stenosis. Moderate right foraminal narrowing. Left neural foramina remains patent. T6-7:  Minimal disc bulge.  Mild facet hypertrophy.  No stenosis. T7-8: Minimal endplate spurring without significant disc bulge. Mild facet hypertrophy. No spinal stenosis. Moderate left foraminal narrowing. Right neural foramen remains patent. T8-9: Mild disc bulge with endplate spurring. Mild facet hypertrophy. No spinal stenosis. Foramina remain patent. T9-10: Left paracentral disc protrusion with slight superior angulation (series 7, image 36). Mild flattening of the left hemi cord without cord signal changes. Mild facet hypertrophy. No significant spinal stenosis. Foramina remain patent. T10-11: Mild disc bulge with endplate spurring. Mild facet hypertrophy. No significant spinal stenosis. Foramina remain patent. T11-12:  Negative interspace.  Mild facet hypertrophy.  No stenosis. T12-L1:  Negative interspace.  Mild facet hypertrophy.  No stenosis. MRI LUMBAR SPINE FINDINGS Segmentation: Standard. Lowest well-formed disc space labeled the L5-S1 level. Alignment: 3 mm retrolisthesis of L5 on S1, with trace anterolisthesis of L4 on L5. Alignment otherwise normal preservation of the normal lumbar lordosis. Vertebrae: Vertebral  body height maintained without acute or chronic fracture. Bone marrow signal intensity within normal limits. No worrisome osseous lesions or abnormal marrow edema. Conus medullaris and cauda equina: Conus extends to the L1 level. Conus and cauda equina appear normal. Paraspinal and other soft tissues: No acute finding.  Chronic fatty atrophy noted within the posterior paraspinous musculature. Left-sided IVC noted. Disc levels: L1-2: Normal interspace. Mild left greater than right facet hypertrophy. No canal or foraminal stenosis. L2-3: Normal interspace. Mild right worse than left facet hypertrophy. No stenosis. L3-4: Normal interspace. Mild right worse than left facet hypertrophy. No spinal stenosis. Foramina remain patent. L4-5: Minimal annular disc bulge. Mild to moderate facet and ligament flavum hypertrophy. Resultant mild bilateral subarticular stenosis. Central canal remains adequately patent. Mild bilateral L4 foraminal narrowing. L5-S1: Mild degenerative intervertebral disc space narrowing with diffuse disc bulge, slightly asymmetric to the right. Associated posterior annular fissure. Moderate bilateral facet hypertrophy. Epidural lipomatosis. No significant spinal stenosis. Moderate bilateral L5 foraminal narrowing. IMPRESSION: MRI CERVICAL SPINE IMPRESSION: 1. Multilevel cervical spondylosis with resultant moderate to severe diffuse spinal stenosis at C3-4 through C6-7, most pronounced at C3-4 secondary cord flattening at multiple levels but without convincing cord signal changes. 2. Multifactorial degenerative changes with resultant moderate to severe bilateral C3 through C7 foraminal stenosis as above. 3. Loss of normal flow void within the left vertebral artery, likely occluded. Preserved flow void is seen within the distal left V4 segment on prior brain MRI from 09/12/2022. MRI THORACIC SPINE IMPRESSION: 1. Left paracentral disc protrusion at T9-10 with mild flattening of the left hemi cord, but no cord signal changes or significant spinal stenosis. 2. Additional multilevel thoracic spondylosis and facet arthrosis throughout the thoracic spine as detailed above. No other significant spinal stenosis or overt neural impingement. Moderate foraminal stenosis on the left at T4-5 and T7-8, and on the right at T5-6. 3.  Small layering bilateral pleural effusions. MRI LUMBAR SPINE IMPRESSION: 1. Mild lower lumbar spondylosis at L4-5 and L5-S1 with resultant mild bilateral subarticular stenosis at L4-5. Associated mild bilateral L4, with moderate bilateral L5 foraminal narrowing. 2. Mild-to-moderate multilevel facet hypertrophy, most pronounced at L5-S1. Findings could contribute to lower back pain. Electronically Signed   By: Rise Mu M.D.   On: 09/13/2022 21:57   MR LUMBAR SPINE WO CONTRAST  Result Date: 09/13/2022 CLINICAL DATA:  Initial evaluation for radiculopathy. EXAM: MRI CERVICAL, THORACIC AND LUMBAR SPINE WITHOUT CONTRAST TECHNIQUE: Multiplanar and multiecho pulse sequences of the cervical spine, to include the craniocervical junction and cervicothoracic junction, and thoracic and lumbar spine, were obtained without intravenous contrast. COMPARISON:  Prior CT from 08/18/2022. FINDINGS: MRI CERVICAL SPINE FINDINGS Alignment: Straightening of the normal cervical lordosis. 3 mm retrolisthesis of C3 on C4 and C5 on C6, with trace 2 mm retrolisthesis of C2 on C3 and C4 on C5. Vertebrae: Vertebral body height maintained without acute or chronic fracture. Bone marrow signal intensity within normal limits. No discrete or worrisome osseous lesions or abnormal marrow edema. Cord: Normal signal and morphology. No convincing cord signal changes. Posterior Fossa, vertebral arteries, paraspinal tissues: Visualized brain and posterior fossa within normal limits. Craniocervical junction normal. Paraspinous soft tissues within normal limits. Loss of normal flow void within the left vertebral artery noted, likely occluded. Preserved flow void is seen within the distal left V4 segment on prior brain MRI. Disc levels: C2-C3: Disc bulge with bilateral uncovertebral spurring, worse on the right. Right-sided facet arthrosis. No significant spinal stenosis. Moderate right worse than left C3  foraminal narrowing. C3-C4: Degenerative  intervertebral disc space narrowing with diffuse disc bulge and uncovertebral spurring. Superimposed moderate-sized right paracentral disc protrusion indents and flattens the ventral thecal sac (series 5, image 17). Secondary cord flattening without convincing cord signal changes. Mild right-sided facet hypertrophy. Resultant severe spinal stenosis with the thecal sac measuring 4 mm in AP diameter. Severe right worse than left C4 foraminal stenosis. C4-C5: Left paracentral disc protrusion flattens and partially effaces the ventral thecal sac. Mild cord flattening without cord signal changes. Moderate right with mild left facet hypertrophy with mild uncovertebral spurring. Moderate spinal stenosis. Moderate bilateral C5 foraminal narrowing. C5-C6: Degenerative vertebral disc space narrowing with diffuse disc osteophyte complex. Broad posterior component flattens and effaces the ventral thecal sac. Secondary cord flattening without cord signal changes. Superimposed facet hypertrophy. Resultant severe spinal stenosis with the thecal sac measuring 6 mm in AP diameter. Severe bilateral C6 foraminal narrowing. C6-C7: Left paracentral disc protrusion indents the ventral thecal sac, contacting the ventral cord (series 5, image 42). Mild cord flattening without cord signal changes. Mild right-sided facet arthrosis with mild uncovertebral spurring. Resultant moderate spinal stenosis. Moderate bilateral C7 foraminal narrowing. C7-T1: Tiny left paracentral disc protrusion minimally flattens the left ventral thecal sac. Mild right-sided facet hypertrophy. No spinal stenosis. Mild right C8 foraminal narrowing. Left neural foramina remains patent. MRI THORACIC SPINE FINDINGS Alignment: Mild exaggeration of the normal thoracic kyphosis. No listhesis. Vertebrae: Vertebral body height maintained without acute or chronic fracture. Bone marrow signal intensity within normal limits. No worrisome osseous lesions or abnormal marrow edema.  Cord:  Normal signal and morphology. Paraspinal and other soft tissues: Small layering bilateral pleural effusions noted. Otherwise unremarkable. Disc levels: T1-2: Tiny left paracentral disc protrusion minimally indents the ventral thecal sac. Mild facet hypertrophy. No stenosis. T2-3: Negative interspace. Right greater than left facet hypertrophy. No spinal stenosis. Mild right foraminal narrowing. Left neural foramen remains patent. T3-4: Negative interspace. Mild facet hypertrophy. No spinal stenosis. Mild right foraminal narrowing. Left neural foramina remains patent. T4-5: Negative interspace. Mild facet hypertrophy. No spinal stenosis. Moderate left foraminal narrowing. Right neural foramina remains patent. T5-6: Right paracentral to foraminal disc protrusion with minimal endplate spurring. Mild right greater left facet hypertrophy. No significant spinal stenosis. Moderate right foraminal narrowing. Left neural foramina remains patent. T6-7:  Minimal disc bulge.  Mild facet hypertrophy.  No stenosis. T7-8: Minimal endplate spurring without significant disc bulge. Mild facet hypertrophy. No spinal stenosis. Moderate left foraminal narrowing. Right neural foramen remains patent. T8-9: Mild disc bulge with endplate spurring. Mild facet hypertrophy. No spinal stenosis. Foramina remain patent. T9-10: Left paracentral disc protrusion with slight superior angulation (series 7, image 36). Mild flattening of the left hemi cord without cord signal changes. Mild facet hypertrophy. No significant spinal stenosis. Foramina remain patent. T10-11: Mild disc bulge with endplate spurring. Mild facet hypertrophy. No significant spinal stenosis. Foramina remain patent. T11-12:  Negative interspace.  Mild facet hypertrophy.  No stenosis. T12-L1:  Negative interspace.  Mild facet hypertrophy.  No stenosis. MRI LUMBAR SPINE FINDINGS Segmentation: Standard. Lowest well-formed disc space labeled the L5-S1 level. Alignment: 3 mm  retrolisthesis of L5 on S1, with trace anterolisthesis of L4 on L5. Alignment otherwise normal preservation of the normal lumbar lordosis. Vertebrae: Vertebral body height maintained without acute or chronic fracture. Bone marrow signal intensity within normal limits. No worrisome osseous lesions or abnormal marrow edema. Conus medullaris and cauda equina: Conus extends to the L1 level. Conus and cauda equina appear normal. Paraspinal and  other soft tissues: No acute finding. Chronic fatty atrophy noted within the posterior paraspinous musculature. Left-sided IVC noted. Disc levels: L1-2: Normal interspace. Mild left greater than right facet hypertrophy. No canal or foraminal stenosis. L2-3: Normal interspace. Mild right worse than left facet hypertrophy. No stenosis. L3-4: Normal interspace. Mild right worse than left facet hypertrophy. No spinal stenosis. Foramina remain patent. L4-5: Minimal annular disc bulge. Mild to moderate facet and ligament flavum hypertrophy. Resultant mild bilateral subarticular stenosis. Central canal remains adequately patent. Mild bilateral L4 foraminal narrowing. L5-S1: Mild degenerative intervertebral disc space narrowing with diffuse disc bulge, slightly asymmetric to the right. Associated posterior annular fissure. Moderate bilateral facet hypertrophy. Epidural lipomatosis. No significant spinal stenosis. Moderate bilateral L5 foraminal narrowing. IMPRESSION: MRI CERVICAL SPINE IMPRESSION: 1. Multilevel cervical spondylosis with resultant moderate to severe diffuse spinal stenosis at C3-4 through C6-7, most pronounced at C3-4 secondary cord flattening at multiple levels but without convincing cord signal changes. 2. Multifactorial degenerative changes with resultant moderate to severe bilateral C3 through C7 foraminal stenosis as above. 3. Loss of normal flow void within the left vertebral artery, likely occluded. Preserved flow void is seen within the distal left V4 segment on  prior brain MRI from 09/12/2022. MRI THORACIC SPINE IMPRESSION: 1. Left paracentral disc protrusion at T9-10 with mild flattening of the left hemi cord, but no cord signal changes or significant spinal stenosis. 2. Additional multilevel thoracic spondylosis and facet arthrosis throughout the thoracic spine as detailed above. No other significant spinal stenosis or overt neural impingement. Moderate foraminal stenosis on the left at T4-5 and T7-8, and on the right at T5-6. 3. Small layering bilateral pleural effusions. MRI LUMBAR SPINE IMPRESSION: 1. Mild lower lumbar spondylosis at L4-5 and L5-S1 with resultant mild bilateral subarticular stenosis at L4-5. Associated mild bilateral L4, with moderate bilateral L5 foraminal narrowing. 2. Mild-to-moderate multilevel facet hypertrophy, most pronounced at L5-S1. Findings could contribute to lower back pain. Electronically Signed   By: Rise Mu M.D.   On: 09/13/2022 21:57   MR THORACIC SPINE WO CONTRAST  Result Date: 09/13/2022 CLINICAL DATA:  Initial evaluation for radiculopathy. EXAM: MRI CERVICAL, THORACIC AND LUMBAR SPINE WITHOUT CONTRAST TECHNIQUE: Multiplanar and multiecho pulse sequences of the cervical spine, to include the craniocervical junction and cervicothoracic junction, and thoracic and lumbar spine, were obtained without intravenous contrast. COMPARISON:  Prior CT from 08/18/2022. FINDINGS: MRI CERVICAL SPINE FINDINGS Alignment: Straightening of the normal cervical lordosis. 3 mm retrolisthesis of C3 on C4 and C5 on C6, with trace 2 mm retrolisthesis of C2 on C3 and C4 on C5. Vertebrae: Vertebral body height maintained without acute or chronic fracture. Bone marrow signal intensity within normal limits. No discrete or worrisome osseous lesions or abnormal marrow edema. Cord: Normal signal and morphology. No convincing cord signal changes. Posterior Fossa, vertebral arteries, paraspinal tissues: Visualized brain and posterior fossa within  normal limits. Craniocervical junction normal. Paraspinous soft tissues within normal limits. Loss of normal flow void within the left vertebral artery noted, likely occluded. Preserved flow void is seen within the distal left V4 segment on prior brain MRI. Disc levels: C2-C3: Disc bulge with bilateral uncovertebral spurring, worse on the right. Right-sided facet arthrosis. No significant spinal stenosis. Moderate right worse than left C3 foraminal narrowing. C3-C4: Degenerative intervertebral disc space narrowing with diffuse disc bulge and uncovertebral spurring. Superimposed moderate-sized right paracentral disc protrusion indents and flattens the ventral thecal sac (series 5, image 17). Secondary cord flattening without convincing cord signal changes. Mild right-sided  facet hypertrophy. Resultant severe spinal stenosis with the thecal sac measuring 4 mm in AP diameter. Severe right worse than left C4 foraminal stenosis. C4-C5: Left paracentral disc protrusion flattens and partially effaces the ventral thecal sac. Mild cord flattening without cord signal changes. Moderate right with mild left facet hypertrophy with mild uncovertebral spurring. Moderate spinal stenosis. Moderate bilateral C5 foraminal narrowing. C5-C6: Degenerative vertebral disc space narrowing with diffuse disc osteophyte complex. Broad posterior component flattens and effaces the ventral thecal sac. Secondary cord flattening without cord signal changes. Superimposed facet hypertrophy. Resultant severe spinal stenosis with the thecal sac measuring 6 mm in AP diameter. Severe bilateral C6 foraminal narrowing. C6-C7: Left paracentral disc protrusion indents the ventral thecal sac, contacting the ventral cord (series 5, image 42). Mild cord flattening without cord signal changes. Mild right-sided facet arthrosis with mild uncovertebral spurring. Resultant moderate spinal stenosis. Moderate bilateral C7 foraminal narrowing. C7-T1: Tiny left  paracentral disc protrusion minimally flattens the left ventral thecal sac. Mild right-sided facet hypertrophy. No spinal stenosis. Mild right C8 foraminal narrowing. Left neural foramina remains patent. MRI THORACIC SPINE FINDINGS Alignment: Mild exaggeration of the normal thoracic kyphosis. No listhesis. Vertebrae: Vertebral body height maintained without acute or chronic fracture. Bone marrow signal intensity within normal limits. No worrisome osseous lesions or abnormal marrow edema. Cord:  Normal signal and morphology. Paraspinal and other soft tissues: Small layering bilateral pleural effusions noted. Otherwise unremarkable. Disc levels: T1-2: Tiny left paracentral disc protrusion minimally indents the ventral thecal sac. Mild facet hypertrophy. No stenosis. T2-3: Negative interspace. Right greater than left facet hypertrophy. No spinal stenosis. Mild right foraminal narrowing. Left neural foramen remains patent. T3-4: Negative interspace. Mild facet hypertrophy. No spinal stenosis. Mild right foraminal narrowing. Left neural foramina remains patent. T4-5: Negative interspace. Mild facet hypertrophy. No spinal stenosis. Moderate left foraminal narrowing. Right neural foramina remains patent. T5-6: Right paracentral to foraminal disc protrusion with minimal endplate spurring. Mild right greater left facet hypertrophy. No significant spinal stenosis. Moderate right foraminal narrowing. Left neural foramina remains patent. T6-7:  Minimal disc bulge.  Mild facet hypertrophy.  No stenosis. T7-8: Minimal endplate spurring without significant disc bulge. Mild facet hypertrophy. No spinal stenosis. Moderate left foraminal narrowing. Right neural foramen remains patent. T8-9: Mild disc bulge with endplate spurring. Mild facet hypertrophy. No spinal stenosis. Foramina remain patent. T9-10: Left paracentral disc protrusion with slight superior angulation (series 7, image 36). Mild flattening of the left hemi cord without  cord signal changes. Mild facet hypertrophy. No significant spinal stenosis. Foramina remain patent. T10-11: Mild disc bulge with endplate spurring. Mild facet hypertrophy. No significant spinal stenosis. Foramina remain patent. T11-12:  Negative interspace.  Mild facet hypertrophy.  No stenosis. T12-L1:  Negative interspace.  Mild facet hypertrophy.  No stenosis. MRI LUMBAR SPINE FINDINGS Segmentation: Standard. Lowest well-formed disc space labeled the L5-S1 level. Alignment: 3 mm retrolisthesis of L5 on S1, with trace anterolisthesis of L4 on L5. Alignment otherwise normal preservation of the normal lumbar lordosis. Vertebrae: Vertebral body height maintained without acute or chronic fracture. Bone marrow signal intensity within normal limits. No worrisome osseous lesions or abnormal marrow edema. Conus medullaris and cauda equina: Conus extends to the L1 level. Conus and cauda equina appear normal. Paraspinal and other soft tissues: No acute finding. Chronic fatty atrophy noted within the posterior paraspinous musculature. Left-sided IVC noted. Disc levels: L1-2: Normal interspace. Mild left greater than right facet hypertrophy. No canal or foraminal stenosis. L2-3: Normal interspace. Mild right worse than left  facet hypertrophy. No stenosis. L3-4: Normal interspace. Mild right worse than left facet hypertrophy. No spinal stenosis. Foramina remain patent. L4-5: Minimal annular disc bulge. Mild to moderate facet and ligament flavum hypertrophy. Resultant mild bilateral subarticular stenosis. Central canal remains adequately patent. Mild bilateral L4 foraminal narrowing. L5-S1: Mild degenerative intervertebral disc space narrowing with diffuse disc bulge, slightly asymmetric to the right. Associated posterior annular fissure. Moderate bilateral facet hypertrophy. Epidural lipomatosis. No significant spinal stenosis. Moderate bilateral L5 foraminal narrowing. IMPRESSION: MRI CERVICAL SPINE IMPRESSION: 1. Multilevel  cervical spondylosis with resultant moderate to severe diffuse spinal stenosis at C3-4 through C6-7, most pronounced at C3-4 secondary cord flattening at multiple levels but without convincing cord signal changes. 2. Multifactorial degenerative changes with resultant moderate to severe bilateral C3 through C7 foraminal stenosis as above. 3. Loss of normal flow void within the left vertebral artery, likely occluded. Preserved flow void is seen within the distal left V4 segment on prior brain MRI from 09/12/2022. MRI THORACIC SPINE IMPRESSION: 1. Left paracentral disc protrusion at T9-10 with mild flattening of the left hemi cord, but no cord signal changes or significant spinal stenosis. 2. Additional multilevel thoracic spondylosis and facet arthrosis throughout the thoracic spine as detailed above. No other significant spinal stenosis or overt neural impingement. Moderate foraminal stenosis on the left at T4-5 and T7-8, and on the right at T5-6. 3. Small layering bilateral pleural effusions. MRI LUMBAR SPINE IMPRESSION: 1. Mild lower lumbar spondylosis at L4-5 and L5-S1 with resultant mild bilateral subarticular stenosis at L4-5. Associated mild bilateral L4, with moderate bilateral L5 foraminal narrowing. 2. Mild-to-moderate multilevel facet hypertrophy, most pronounced at L5-S1. Findings could contribute to lower back pain. Electronically Signed   By: Rise Mu M.D.   On: 09/13/2022 21:57   ECHOCARDIOGRAM COMPLETE  Result Date: 09/13/2022    ECHOCARDIOGRAM REPORT   Patient Name:   TIMMOTHY NONNEMACHER Date of Exam: 09/13/2022 Medical Rec #:  [id number]   Height:       68.0 in Accession #:    [id number]  Weight:       219.8 lb Date of Birth:  09-Jun-1950    BSA:          2.127 m Patient Age:    71 years    BP:           102/77 mmHg Patient Gender: M           HR:           73 bpm. Exam Location:  Inpatient Procedure: 2D Echo, Cardiac Doppler, Color Doppler and Intracardiac            Opacification Agent  Indications:    CHF- Acute diastolic  History:        Patient has prior history of Echocardiogram examinations, most                 recent 03/14/2022. CHF, Previous Myocardial Infarction; Risk                 Factors:Diabetes. Hx of DVT, PE, CKD.  Sonographer:    Milda Smart Referring Phys: [id number] [facility]  Sonographer Comments: Technically difficult study due to poor echo windows. Image acquisition challenging due to patient body habitus and Image acquisition challenging due to respiratory motion. IMPRESSIONS  1. Left ventricular ejection fraction, by estimation, is 60 to 65%. The left ventricle has normal function. The left ventricle has no regional wall motion abnormalities. There is mild concentric left ventricular hypertrophy. Left  ventricular diastolic parameters are indeterminate. Elevated left ventricular end-diastolic pressure.  2. Right ventricular systolic function was not well visualized. The right ventricular size is normal.  3. The mitral valve is normal in structure. No evidence of mitral valve regurgitation. No evidence of mitral stenosis.  4. The aortic valve is tricuspid. Aortic valve regurgitation is trivial. Aortic valve sclerosis/calcification is present, without any evidence of aortic stenosis.  5. The inferior vena cava is normal in size with greater than 50% respiratory variability, suggesting right atrial pressure of 3 mmHg.  6. Ascending aorta measurements are within normal limits for age when indexed to body surface area. FINDINGS  Left Ventricle: Left ventricular ejection fraction, by estimation, is 60 to 65%. The left ventricle has normal function. The left ventricle has no regional wall motion abnormalities. Definity contrast agent was given IV to delineate the left ventricular  endocardial borders. The left ventricular internal cavity size was normal in size. There is mild concentric left ventricular hypertrophy. Left ventricular diastolic parameters are indeterminate.  Elevated left ventricular end-diastolic pressure. Right Ventricle: The right ventricular size is normal. No increase in right ventricular wall thickness. Right ventricular systolic function was not well visualized. Left Atrium: Left atrial size was normal in size. Right Atrium: Right atrial size was normal in size. Pericardium: There is no evidence of pericardial effusion. Mitral Valve: The mitral valve is normal in structure. No evidence of mitral valve regurgitation. No evidence of mitral valve stenosis. MV peak gradient, 4.8 mmHg. The mean mitral valve gradient is 2.0 mmHg. Tricuspid Valve: The tricuspid valve is normal in structure. Tricuspid valve regurgitation is trivial. No evidence of tricuspid stenosis. Aortic Valve: The aortic valve is tricuspid. Aortic valve regurgitation is trivial. Aortic valve sclerosis/calcification is present, without any evidence of aortic stenosis. Pulmonic Valve: The pulmonic valve was normal in structure. Pulmonic valve regurgitation is mild. No evidence of pulmonic stenosis. Aorta: The aortic root is normal in size and structure. Ascending aorta measurements are within normal limits for age when indexed to body surface area. Venous: The inferior vena cava is normal in size with greater than 50% respiratory variability, suggesting right atrial pressure of 3 mmHg. IAS/Shunts: No atrial level shunt detected by color flow Doppler.  LEFT VENTRICLE PLAX 2D LVIDd:         4.00 cm     Diastology LVIDs:         3.10 cm     LV e' medial:    4.21 cm/s LV PW:         1.20 cm     LV E/e' medial:  19.7 LV IVS:        1.10 cm     LV e' lateral:   5.70 cm/s LVOT diam:     2.30 cm     LV E/e' lateral: 14.6 LV SV:         94 LV SV Index:   44 LVOT Area:     4.15 cm  LV Volumes (MOD) LV vol d, MOD A2C: 71.2 ml LV vol d, MOD A4C: 76.3 ml LV vol s, MOD A2C: 33.1 ml LV vol s, MOD A4C: 31.6 ml LV SV MOD A2C:     38.1 ml LV SV MOD A4C:     76.3 ml LV SV MOD BP:      40.7 ml RIGHT VENTRICLE             IVC RV S prime:     9.99 cm/s  IVC diam:  1.60 cm TAPSE (M-mode): 1.4 cm LEFT ATRIUM             Index        RIGHT ATRIUM           Index LA diam:        3.40 cm 1.60 cm/m   RA Area:     11.80 cm LA Vol (A2C):   49.9 ml 23.46 ml/m  RA Volume:   23.90 ml  11.23 ml/m LA Vol (A4C):   41.5 ml 19.51 ml/m LA Biplane Vol: 46.4 ml 21.81 ml/m  AORTIC VALVE LVOT Vmax:   97.30 cm/s LVOT Vmean:  74.900 cm/s LVOT VTI:    0.226 m  AORTA Ao Root diam: 3.80 cm Ao Asc diam:  4.00 cm MITRAL VALVE MV Area (PHT): 3.53 cm    SHUNTS MV Area VTI:   3.04 cm    Systemic VTI:  0.23 m MV Peak grad:  4.8 mmHg    Systemic Diam: 2.30 cm MV Mean grad:  2.0 mmHg MV Vmax:       1.09 m/s MV Vmean:      61.5 cm/s MV Decel Time: 215 msec MV E velocity: 83.10 cm/s MV A velocity: 90.80 cm/s MV E/A ratio:  0.92 Armanda Magic MD Electronically signed by Armanda Magic MD Signature Date/Time: 09/13/2022/11:26:16 AM    Final    EEG adult  Result Date: 09/13/2022 Charlsie Quest, MD     09/13/2022 10:04 AM Patient Name: Anthoy Mcelhannon MRN: [id number] Epilepsy Attending: Charlsie Quest Referring Physician/Provider: Buena Irish, MD Date: 09/13/2022 Duration: 22.28 mins Patient history: 72yo M with ams getting eeg to evaluate for seizure Level of alertness: Awake,asleep AEDs during EEG study: None Technical aspects: This EEG study was done with scalp electrodes positioned according to the 10-20 International system of electrode placement. Electrical activity was reviewed with band pass filter of 1-70Hz , sensitivity of 7 uV/mm, display speed of 59mm/sec with a 60Hz  notched filter applied as appropriate. EEG data were recorded continuously and digitally stored.  Video monitoring was available and reviewed as appropriate. Description: The posterior dominant rhythm consists of 9-10 Hz activity of moderate voltage (25-35 uV) seen predominantly in posterior head regions, symmetric and reactive to eye opening and eye closing. Sleep was characterized by  vertex waves, sleep spindles (12 to 14 Hz), maximal frontocentral region. Hyperventilation and photic stimulation were not performed.   IMPRESSION: This study is within normal limits. No seizures or epileptiform discharges were seen throughout the recording. A normal interictal EEG does not exclude the diagnosis of epilepsy. Charlsie Quest   MR BRAIN WO CONTRAST  Result Date: 09/13/2022 CLINICAL DATA:  Initial evaluation for seizure. EXAM: MRI HEAD WITHOUT CONTRAST TECHNIQUE: Multiplanar, multiecho pulse sequences of the brain and surrounding structures were obtained without intravenous contrast. COMPARISON:  Prior CT from earlier the same day. FINDINGS: Brain: Mild cerebral and cerebellar atrophy. Patchy T2/FLAIR hyperintensity involving the periventricular white matter, consistent with chronic small vessel ischemic disease, mild in nature. No evidence for acute or subacute ischemia. No parenchymal changes of status epilepticus are seen. No areas of chronic cortical infarction or other insult. No acute or chronic intracranial blood products. No mass lesion, midline shift or mass effect. Mild ventricular prominence related to global parenchymal volume loss of hydrocephalus. No extra-axial fluid collection. Pituitary gland and suprasellar region within normal limits. No intrinsic temporal lobe abnormality. Vascular: Major intracranial vascular flow voids are maintained. Skull and upper cervical spine: Craniocervical junction within  normal limits. Bone marrow signal intensity within normal limits. No scalp soft tissue abnormality. Sinuses/Orbits: Globes and orbital soft tissues within normal limits. Scattered mucosal thickening noted about the frontoethmoidal and maxillary sinuses. No mastoid effusion. Other: None. IMPRESSION: 1. No acute intracranial abnormality. 2. Mild cerebral and cerebellar atrophy with chronic small vessel ischemic disease. Electronically Signed   By: Rise Mu M.D.   On:  09/13/2022 01:17   DG Chest Portable 1 View  Result Date: 09/12/2022 CLINICAL DATA:  Loss of consciousness EXAM: PORTABLE CHEST 1 VIEW COMPARISON:  08/26/2022 x-ray and older FINDINGS: Stable enlarged cardiopericardial silhouette. Improving left basilar atelectasis. No consolidation, pneumothorax, effusion or edema. Overlapping cardiac leads. Degenerative changes seen of the shoulders. IMPRESSION: Improving left basilar atelectasis. Electronically Signed   By: Karen Kays M.D.   On: 09/12/2022 14:36   CT Head Wo Contrast  Result Date: 09/12/2022 CLINICAL DATA:  Mental status change, unknown cause EXAM: CT HEAD WITHOUT CONTRAST TECHNIQUE: Contiguous axial images were obtained from the base of the skull through the vertex without intravenous contrast. RADIATION DOSE REDUCTION: This exam was performed according to the departmental dose-optimization program which includes automated exposure control, adjustment of the mA and/or kV according to patient size and/or use of iterative reconstruction technique. COMPARISON:  08/26/2022 FINDINGS: Brain: No evidence of acute infarction, hemorrhage, hydrocephalus, extra-axial collection or mass lesion/mass effect. Scattered low-density changes within the periventricular and subcortical white matter most compatible with chronic microvascular ischemic change. Mild diffuse cerebral volume loss. Vascular: Atherosclerotic calcifications involving the large vessels of the skull base. No unexpected hyperdense vessel. Skull: Normal. Negative for fracture or focal lesion. Sinuses/Orbits: Similar degree of right-sided paranasal sinus mucosal thickening. Other: None. IMPRESSION: 1. No acute intracranial findings. 2. Chronic microvascular ischemic change and cerebral volume loss. Electronically Signed   By: Duanne Guess D.O.   On: 09/12/2022 14:19   CT HEAD WO CONTRAST ( )  Result Date: 08/26/2022 CLINICAL DATA:  Left-sided weakness for 2 days. Polytrauma blunt, head  trauma. EXAM: CT HEAD WITHOUT CONTRAST CT CERVICAL SPINE WITHOUT CONTRAST TECHNIQUE: Multidetector CT imaging of the head and cervical spine was performed following the standard protocol without intravenous contrast. Multiplanar CT image reconstructions of the cervical spine were also generated. RADIATION DOSE REDUCTION: This exam was performed according to the departmental dose-optimization program which includes automated exposure control, adjustment of the mA and/or kV according to patient size and/or use of iterative reconstruction technique. COMPARISON:  03/19/2022. FINDINGS: CT HEAD FINDINGS Brain: No acute intracranial hemorrhage, midline shift or mass effect. No extra-axial fluid collection. Diffuse atrophy is noted. Periventricular white matter hypodensities are present bilaterally. No hydrocephalus. Vascular: No hyperdense vessel or unexpected calcification. Skull: Normal. Negative for fracture or focal lesion. Sinuses/Orbits: There is partial opacification of the frontal sinus and ethmoid air cells on the right. No acute orbital abnormality. Other: None. CT CERVICAL SPINE FINDINGS Alignment: There is mild retrolisthesis at C3-C4 and C5-C6. Skull base and vertebrae: No acute fracture. There is stable bony deformity of the posterior arch at C1 on the left. Soft tissues and spinal canal: No prevertebral fluid or swelling. No visible canal hematoma. Disc levels: Multilevel intervertebral disc space narrowing, disc osteophyte complexes, and facet arthropathy resulting in mild-to-moderate spinal canal stenosis, most pronounced at C3-C4 and C5-C6. Upper chest: No acute abnormality. Other: None. IMPRESSION: 1. No acute intracranial process. 2. Atrophy with chronic microvascular ischemic changes. 3. Multilevel degenerative changes in the cervical spine without evidence of acute fracture. Electronically Signed   By:  Thornell Sartorius M.D.   On: 08/26/2022 23:16   CT CERVICAL SPINE WO CONTRAST  Result Date:  08/26/2022 CLINICAL DATA:  Left-sided weakness for 2 days. Polytrauma blunt, head trauma. EXAM: CT HEAD WITHOUT CONTRAST CT CERVICAL SPINE WITHOUT CONTRAST TECHNIQUE: Multidetector CT imaging of the head and cervical spine was performed following the standard protocol without intravenous contrast. Multiplanar CT image reconstructions of the cervical spine were also generated. RADIATION DOSE REDUCTION: This exam was performed according to the departmental dose-optimization program which includes automated exposure control, adjustment of the mA and/or kV according to patient size and/or use of iterative reconstruction technique. COMPARISON:  03/19/2022. FINDINGS: CT HEAD FINDINGS Brain: No acute intracranial hemorrhage, midline shift or mass effect. No extra-axial fluid collection. Diffuse atrophy is noted. Periventricular white matter hypodensities are present bilaterally. No hydrocephalus. Vascular: No hyperdense vessel or unexpected calcification. Skull: Normal. Negative for fracture or focal lesion. Sinuses/Orbits: There is partial opacification of the frontal sinus and ethmoid air cells on the right. No acute orbital abnormality. Other: None. CT CERVICAL SPINE FINDINGS Alignment: There is mild retrolisthesis at C3-C4 and C5-C6. Skull base and vertebrae: No acute fracture. There is stable bony deformity of the posterior arch at C1 on the left. Soft tissues and spinal canal: No prevertebral fluid or swelling. No visible canal hematoma. Disc levels: Multilevel intervertebral disc space narrowing, disc osteophyte complexes, and facet arthropathy resulting in mild-to-moderate spinal canal stenosis, most pronounced at C3-C4 and C5-C6. Upper chest: No acute abnormality. Other: None. IMPRESSION: 1. No acute intracranial process. 2. Atrophy with chronic microvascular ischemic changes. 3. Multilevel degenerative changes in the cervical spine without evidence of acute fracture. Electronically Signed   By: Thornell Sartorius M.D.    On: 08/26/2022 23:16   DG Chest Portable 1 View  Result Date: 08/26/2022 CLINICAL DATA:  Left-sided body stiffness for 2 days. Shortness of breath. EXAM: PORTABLE CHEST 1 VIEW COMPARISON:  04/16/2022. FINDINGS: The heart is enlarged and the mediastinal contour is within normal limits. Strandy atelectasis or infiltrate is present at the left lung base. No effusion or pneumothorax. No acute osseous abnormality. IMPRESSION: Mild atelectasis or infiltrate at the left lung base. Electronically Signed   By: Thornell Sartorius M.D.   On: 08/26/2022 23:02      Subjective: Patient seen and examined at bedside.  No fever, vomiting, chest pain, shortness of breath reported.  Leg swelling is improving.  No seizures reported.  Patient feels okay going home today: Wife present at bedside also thinks that patient is stable for discharge.  Discharge Exam: Vitals:   09/15/22 0419 09/15/22 0720  BP: 121/70 118/86  Pulse: 87 96  Resp: 17 18  Temp: 98 F (36.7 C) 97.7 F (36.5 C)  SpO2: 97% 94%    General: Pt is alert, awake, not in acute distress.  On room air.  Slow to respond. Cardiovascular: rate controlled, S1/S2 + Respiratory: bilateral decreased breath sounds at bases with some basilar crackles Abdominal: Soft, obese, NT, ND, bowel sounds + Extremities: Bilateral lower extremity edema present but improving; no cyanosis    The results of significant diagnostics from this hospitalization (including imaging, microbiology, ancillary and laboratory) are listed below for reference.     Microbiology: Recent Results (from the past 240 hour(s))  MRSA Next Gen by PCR, Nasal     Status: None   Collection Time: 09/12/22  6:55 PM   Specimen: Nasal Mucosa; Nasal Swab  Result Value Ref Range Status   MRSA by PCR Next  Gen NOT DETECTED NOT DETECTED Final    Comment: (NOTE) The GeneXpert MRSA Assay (FDA approved for NASAL specimens only), is one component of a comprehensive MRSA colonization  surveillance program. It is not intended to diagnose MRSA infection nor to guide or monitor treatment for MRSA infections. Test performance is not FDA approved in patients less than 34 years old. Performed at [facility], [address], [place], [place] [address]      Labs: BNP (last 3 results) Recent Labs    03/11/22 1900 03/19/22 1548 04/06/22 1529  BNP 102.8* 54.3 60.2   Basic Metabolic Panel: Recent Labs  Lab 09/12/22 1424 09/13/22 0023 09/14/22 1759  NA 135 139  --   K 4.5 4.0  --   CL 100 104  --   CO2 25 25  --   GLUCOSE 284* 290* 578*  BUN 23 17  --   CREATININE 1.27* 1.22  --   CALCIUM 9.2 9.7  --    Liver Function Tests: Recent Labs  Lab 09/12/22 1424 09/13/22 0023  AST 24 20  ALT 21 21  ALKPHOS 49 45  BILITOT 0.6 0.6  PROT 6.4* 5.8*  ALBUMIN 3.9 3.7   No results for input(s): "LIPASE", "AMYLASE" in the last 168 hours. No results for input(s): "AMMONIA" in the last 168 hours. CBC: Recent Labs  Lab 09/12/22 1424  WBC 6.4  NEUTROABS 5.0  HGB 9.5*  HCT 28.8*  MCV 96.6  PLT 242   Cardiac Enzymes: No results for input(s): "CKTOTAL", "CKMB", "CKMBINDEX", "TROPONINI" in the last 168 hours. BNP: Invalid input(s): "POCBNP" CBG: Recent Labs  Lab 09/14/22 1749 09/14/22 1956 09/14/22 2112 09/14/22 2216 09/15/22 0625  GLUCAP 492* 434* 387* 341* 147*   D-Dimer No results for input(s): "DDIMER" in the last 72 hours. Hgb A1c Recent Labs    09/13/22 0023  HGBA1C 9.9*   Lipid Profile No results for input(s): "CHOL", "HDL", "LDLCALC", "TRIG", "CHOLHDL", "LDLDIRECT" in the last 72 hours. Thyroid function studies No results for input(s): "TSH", "T4TOTAL", "T3FREE", "THYROIDAB" in the last 72 hours.  Invalid input(s): "FREET3" Anemia work up No results for input(s): "VITAMINB12", "FOLATE", "FERRITIN", "TIBC", "IRON", "RETICCTPCT" in the last 72 hours. Urinalysis    Component Value Date/Time   COLORURINE YELLOW 09/12/2022 1424    APPEARANCEUR CLEAR 09/12/2022 1424   LABSPEC <=1.005 09/12/2022 1424   PHURINE 6.0 09/12/2022 1424   GLUCOSEU >=500 (A) 09/12/2022 1424   HGBUR NEGATIVE 09/12/2022 1424   BILIRUBINUR NEGATIVE 09/12/2022 1424   KETONESUR NEGATIVE 09/12/2022 1424   PROTEINUR NEGATIVE 09/12/2022 1424   UROBILINOGEN 0.2 09/28/2014 1555   NITRITE NEGATIVE 09/12/2022 1424   LEUKOCYTESUR NEGATIVE 09/12/2022 1424   Sepsis Labs Recent Labs  Lab 09/12/22 1424  WBC 6.4   Microbiology Recent Results (from the past 240 hour(s))  MRSA Next Gen by PCR, Nasal     Status: None   Collection Time: 09/12/22  6:55 PM   Specimen: Nasal Mucosa; Nasal Swab  Result Value Ref Range Status   MRSA by PCR Next Gen NOT DETECTED NOT DETECTED Final    Comment: (NOTE) The GeneXpert MRSA Assay (FDA approved for NASAL specimens only), is one component of a comprehensive MRSA colonization surveillance program. It is not intended to diagnose MRSA infection nor to guide or monitor treatment for MRSA infections. Test performance is not FDA approved in patients less than 64 years old. Performed at [facility], [address] [place], [place] [address]  Time coordinating discharge: 35 minutes  SIGNED:   Glade Lloyd, MD  [facility] 09/15/2022, 9:57 AM

## 2022-09-17 ENCOUNTER — Telehealth: Payer: Self-pay | Admitting: *Deleted

## 2022-09-17 ENCOUNTER — Encounter: Payer: Self-pay | Admitting: *Deleted

## 2022-09-17 NOTE — Transitions of Care (Post Inpatient/ED Visit) (Signed)
   09/17/2022  Name: [name] MRN: [id number] DOB: 11/09/1950  Today's TOC FU Call Status: Today's TOC FU Call Status:: Unsuccessul Call (1st Attempt) Unsuccessful Call (1st Attempt) Date: 09/17/22  Attempted to reach the patient regarding the most recent Inpatient visit; left HIPAA compliant voice message requesting call back  Follow Up Plan: Additional outreach attempts will be made to reach the patient to complete the Transitions of Care (Post Inpatient visit) call.   Caryl Pina, RN, BSN, CCRN Alumnus RN CM Care Coordination/ Transition of Care- [facility] Care Management [phone]: direct office

## 2022-09-18 ENCOUNTER — Telehealth: Payer: Self-pay | Admitting: *Deleted

## 2022-09-18 ENCOUNTER — Encounter: Payer: Self-pay | Admitting: *Deleted

## 2022-09-18 NOTE — Transitions of Care (Post Inpatient/ED Visit) (Signed)
   09/18/2022  Name: [name] MRN: [id number] DOB: 11-11-1950  Today's TOC FU Call Status: Today's TOC FU Call Status:: Unsuccessful Call (2nd Attempt) Unsuccessful Call (2nd Attempt) Date: 09/18/22  Attempted to reach the patient regarding the most recent Inpatient visit; left HIPAA compliant voice message requesting call back  Follow Up Plan: Additional outreach attempts will be made to reach the patient to complete the Transitions of Care (Post Inpatient visit) call.   Caryl Pina, RN, BSN, CCRN Alumnus RN CM Care Coordination/ Transition of Care- [facility] Care Management [phone]: direct office

## 2022-09-19 ENCOUNTER — Encounter: Payer: Self-pay | Admitting: *Deleted

## 2022-09-19 ENCOUNTER — Telehealth: Payer: Self-pay | Admitting: *Deleted

## 2022-09-19 DIAGNOSIS — G7241 Inclusion body myositis [IBM]: Secondary | ICD-10-CM | POA: Diagnosis not present

## 2022-09-19 DIAGNOSIS — I890 Lymphedema, not elsewhere classified: Secondary | ICD-10-CM | POA: Diagnosis not present

## 2022-09-19 DIAGNOSIS — R609 Edema, unspecified: Secondary | ICD-10-CM | POA: Diagnosis not present

## 2022-09-19 DIAGNOSIS — L89619 Pressure ulcer of right heel, unspecified stage: Secondary | ICD-10-CM | POA: Diagnosis not present

## 2022-09-19 NOTE — Transitions of Care (Post Inpatient/ED Visit) (Signed)
   09/19/2022  Name: [name] MRN: [id number] DOB: [birth year]  Today's TOC FU Call Status: Today's TOC FU Call Status:: Unsuccessful Call (3rd Attempt) Unsuccessful Call (3rd Attempt) Date: 09/19/22  Attempted to reach the patient regarding the most recent Inpatient visit; left HIPAA compliant voice message requesting call back  Follow Up Plan: No further outreach attempts will be made at this time. We have been unable to contact the patient.  Caryl Pina, RN, BSN, CCRN Alumnus RN CM Care Coordination/ Transition of Care- [facility] Care Management [phone]: direct office

## 2022-09-20 ENCOUNTER — Ambulatory Visit: Payer: Medicare Other | Admitting: Physical Therapy

## 2022-10-04 ENCOUNTER — Encounter (HOSPITAL_BASED_OUTPATIENT_CLINIC_OR_DEPARTMENT_OTHER): Payer: Medicare Other | Attending: General Surgery | Admitting: Internal Medicine

## 2022-10-04 ENCOUNTER — Telehealth: Payer: Self-pay | Admitting: Family Medicine

## 2022-10-04 DIAGNOSIS — L89322 Pressure ulcer of left buttock, stage 2: Secondary | ICD-10-CM | POA: Diagnosis not present

## 2022-10-04 DIAGNOSIS — Z86711 Personal history of pulmonary embolism: Secondary | ICD-10-CM | POA: Diagnosis not present

## 2022-10-04 DIAGNOSIS — L97818 Non-pressure chronic ulcer of other part of right lower leg with other specified severity: Secondary | ICD-10-CM | POA: Diagnosis not present

## 2022-10-04 DIAGNOSIS — R350 Frequency of micturition: Secondary | ICD-10-CM | POA: Diagnosis not present

## 2022-10-04 DIAGNOSIS — I509 Heart failure, unspecified: Secondary | ICD-10-CM | POA: Insufficient documentation

## 2022-10-04 DIAGNOSIS — I890 Lymphedema, not elsewhere classified: Secondary | ICD-10-CM | POA: Insufficient documentation

## 2022-10-04 DIAGNOSIS — E11622 Type 2 diabetes mellitus with other skin ulcer: Secondary | ICD-10-CM | POA: Diagnosis not present

## 2022-10-04 DIAGNOSIS — L89312 Pressure ulcer of right buttock, stage 2: Secondary | ICD-10-CM | POA: Insufficient documentation

## 2022-10-04 DIAGNOSIS — E039 Hypothyroidism, unspecified: Secondary | ICD-10-CM | POA: Diagnosis not present

## 2022-10-04 DIAGNOSIS — Z125 Encounter for screening for malignant neoplasm of prostate: Secondary | ICD-10-CM | POA: Diagnosis not present

## 2022-10-04 DIAGNOSIS — M069 Rheumatoid arthritis, unspecified: Secondary | ICD-10-CM | POA: Diagnosis not present

## 2022-10-04 DIAGNOSIS — R3915 Urgency of urination: Secondary | ICD-10-CM | POA: Diagnosis not present

## 2022-10-04 DIAGNOSIS — D649 Anemia, unspecified: Secondary | ICD-10-CM | POA: Insufficient documentation

## 2022-10-04 DIAGNOSIS — N5082 Scrotal pain: Secondary | ICD-10-CM | POA: Diagnosis not present

## 2022-10-04 DIAGNOSIS — Z86718 Personal history of other venous thrombosis and embolism: Secondary | ICD-10-CM | POA: Insufficient documentation

## 2022-10-04 DIAGNOSIS — K219 Gastro-esophageal reflux disease without esophagitis: Secondary | ICD-10-CM | POA: Diagnosis not present

## 2022-10-04 DIAGNOSIS — G7249 Other inflammatory and immune myopathies, not elsewhere classified: Secondary | ICD-10-CM | POA: Insufficient documentation

## 2022-10-04 DIAGNOSIS — L97812 Non-pressure chronic ulcer of other part of right lower leg with fat layer exposed: Secondary | ICD-10-CM | POA: Diagnosis not present

## 2022-10-04 NOTE — Telephone Encounter (Signed)
Received call from MD at wound care stating that [name] had increased pitting edema in b/l lower extremities.  Please have him double up on torsemide, see me or cardiology early next week for follow up.    CM

## 2022-10-04 NOTE — Telephone Encounter (Signed)
Attempted to contact the patient regarding the provider's recommendation. No answer, left a brief vm msg for patient to return a call back to the clinic. Direct call back info provided.

## 2022-10-05 NOTE — Progress Notes (Signed)
EDWARDS, MCKELVIE ([id number]) [phone] [id number] Page 1 of 4 Visit Report for 10/04/2022 Abuse Risk Screen Details Patient Name: Date of Service: DOMINYCK, RESER 10/04/2022 8:00 A M Medical Record Number: [id number] Patient Account Number: [id number] Date of Birth/Sex: Treating RN: [birth year] (72 y.o. Dianna Limbo Primary Care Avaleigh Decuir: Everrett Coombe Other Clinician: Referring Kwamane Whack: Treating Asuncion Shibata/Extender: Shawna Clamp in Treatment: 0 Abuse Risk Screen Items Answer ABUSE RISK SCREEN: Has anyone close to you tried to hurt or harm you recentlyo No Do you feel uncomfortable with anyone in your familyo No Has anyone forced you do things that you didnt want to doo No Electronic Signature(s) Signed: 10/04/2022 6:33:35 PM By: Karie Schwalbe RN Entered By: Karie Schwalbe on 10/04/2022 08:29:41 -------------------------------------------------------------------------------- Activities of Daily Living Details Patient Name: Date of Service: COCHISE, DINNEEN 10/04/2022 8:00 A M Medical Record Number: [id number] Patient Account Number: [id number] Date of Birth/Sex: Treating RN: Sep 13, 1950 (72 y.o. Dianna Limbo Primary Care Koleson Reifsteck: Everrett Coombe Other Clinician: Referring Idil Maslanka: Treating Mililani Murthy/Extender: Shawna Clamp in Treatment: 0 Activities of Daily Living Items Answer Activities of Daily Living (Please select one for each item) Drive Automobile Not Able T Medications ake Need Assistance Use T elephone Need Assistance Care for Appearance Need Assistance Use T oilet Need Assistance Bath / Shower Need Assistance Dress Self Need Assistance Feed Self Need Assistance Walk Need Assistance Get In / Out Bed Need Assistance Housework Need Assistance Prepare Meals Need Assistance Handle Money Completely Able Shop for Self Need Assistance Electronic Signature(s) Signed: 10/04/2022 6:33:35 PM By: Karie Schwalbe RN Entered By: Karie Schwalbe on 10/04/2022 08:30:42 -------------------------------------------------------------------------------- Education Screening Details Patient Name: Date of Service: CLETE, KUCH 10/04/2022 8:00 A M Medical Record Number: [id number] Patient Account Number: [id number] Date of Birth/Sex: Treating RN: 1950-07-04 (72 y.o. Dianna Limbo Primary Care Emeka Lindner: Everrett Coombe Other Clinician: Referring Katai Marsico: Treating Aleksa Collinsworth/Extender: Shawna Clamp in Treatment: 0 [place], [place] ([id number]) [phone].pdf Page 2 of 4 Primary Learner Assessed: Patient Learning Preferences/Education Level/Primary Language Learning Preference: Explanation, Demonstration Highest Education Level: College or Above Preferred Language: English Cognitive Barrier Language Barrier: No Translator Needed: No Memory Deficit: No Emotional Barrier: No Cultural/Religious Beliefs Affecting Medical Care: No Physical Barrier Impaired Vision: Yes Diminished vision Impaired Hearing: Yes Hard of hearing Decreased Hand dexterity: No Knowledge/Comprehension Knowledge Level: High Comprehension Level: High Ability to understand written instructions: High Ability to understand verbal instructions: High Motivation Anxiety Level: Calm Cooperation: Cooperative Education Importance: Acknowledges Need Interest in Health Problems: Asks Questions Perception: Coherent Willingness to Engage in Self-Management High Activities: Readiness to Engage in Self-Management High Activities: Electronic Signature(s) Signed: 10/04/2022 6:33:35 PM By: Karie Schwalbe RN Entered By: Karie Schwalbe on 10/04/2022 08:32:20 -------------------------------------------------------------------------------- Fall Risk Assessment Details Patient Name: Date of Service: Renita Papa R 10/04/2022 8:00 A M Medical Record Number: [id number] Patient Account Number:  [id number] Date of Birth/Sex: Treating RN: November 20, 1950 (72 y.o. Dianna Limbo Primary Care Gaila Engebretsen: Everrett Coombe Other Clinician: Referring Madilyne Tadlock: Treating Adryan Shin/Extender: Shawna Clamp in Treatment: 0 Fall Risk Assessment Items Have you had 2 or more falls in the last 12 monthso 0 Yes Have you had any fall that resulted in injury in the last 12 monthso 0 Yes FALLS RISK SCREEN History of falling - immediate or within 3 months 0 No Secondary diagnosis (Do you have 2 or more medical diagnoseso) 0 No Ambulatory aid None/bed rest/wheelchair/nurse 0 Yes Crutches/cane/walker 15 Yes Furniture 0 No Intravenous therapy  Access/Saline/Heparin Lock 0 No Gait/Transferring Normal/ bed rest/ wheelchair 0 No Weak (short steps with or without shuffle, stooped but able to lift head while walking, may seek 0 No support from furniture) Impaired (short steps with shuffle, may have difficulty arising from chair, head down, impaired 20 Yes balance) Mental Status Oriented to own ability 0 No Overestimates or forgets limitations 0 No Risk Level: Medium Risk Score: 35 Pelland, Dayven ([id number]) [id number] Page 3 of 4 Electronic Signature(s) -------------------------------------------------------------------------------- Foot Assessment Details Patient Name: Date of Service: MICHAL, BENESH 10/04/2022 8:00 A M Medical Record Number: [id number] Patient Account Number: [id number] Date of Birth/Sex: Treating RN: Nov 24, 1950 (72 y.o. Dianna Limbo Primary Care Asmaa Tirpak: Everrett Coombe Other Clinician: Referring Lynnda Wiersma: Treating Luverta Korte/Extender: Shawna Clamp in Treatment: 0 Foot Assessment Items Site Locations + = Sensation present, - = Sensation absent, C = Callus, U = Ulcer R = Redness, W = Warmth, M = Maceration, PU = Pre-ulcerative lesion F = Fissure, S = Swelling, D = Dryness Assessment Right:  Left: Other Deformity: No No Prior Foot Ulcer: No No Prior Amputation: No No Charcot Joint: No No Ambulatory Status: Ambulatory With Help Assistance Device: Walker Gait: Surveyor, mining) Signed: 10/04/2022 6:33:35 PM By: Karie Schwalbe RN Entered By: Karie Schwalbe on 10/04/2022 08:37:39 -------------------------------------------------------------------------------- Nutrition Risk Screening Details Patient Name: Date of Service: CAELUM, NYGARD 10/04/2022 8:00 A M Medical Record Number: [id number] Patient Account Number: [id number] Date of Birth/Sex: Treating RN: [birth year] (72 y.o. Dianna Limbo Primary Care Taevion Sikora: Everrett Coombe Other Clinician: Referring Latana Colin: Treating Donevin Sainsbury/Extender: Shawna Clamp in Treatment: 0 Height (in): Weight (lbs): Body Mass Index (BMI): Scholten, Moyses ([id number]) [id number] Page 4 of 4 Nutrition Risk Screening Items Score Screening NUTRITION RISK SCREEN: I have an illness or condition that made me change the kind and/or amount of food I eat 0 No I eat fewer than two meals per day 0 No I eat few fruits and vegetables, or milk products 0 No I have three or more drinks of beer, liquor or wine almost every day 0 No I have tooth or mouth problems that make it hard for me to eat 0 No I don't always have enough money to buy the food I need 0 No I eat alone most of the time 0 No I take three or more different prescribed or over-the-counter drugs a day 0 No Without wanting to, I have lost or gained 10 pounds in the last six months 0 No I am not always physically able to shop, cook and/or feed myself 0 No Nutrition Protocols Good Risk Protocol Provide education on elevated blood Moderate Risk Protocol 0 sugars and impact on wound healing, as applicable High Risk Proctocol Risk Level: Good Risk Score: 0 Electronic Signature(s) Signed: 10/04/2022 6:33:35 PM By: Karie Schwalbe RN Entered By: Karie Schwalbe on 10/04/2022 08:34:21

## 2022-10-09 ENCOUNTER — Ambulatory Visit (HOSPITAL_BASED_OUTPATIENT_CLINIC_OR_DEPARTMENT_OTHER): Payer: Medicare Other | Admitting: Family

## 2022-10-09 ENCOUNTER — Other Ambulatory Visit: Payer: Self-pay

## 2022-10-09 ENCOUNTER — Ambulatory Visit: Payer: Medicare Other | Attending: Family Medicine | Admitting: Physical Therapy

## 2022-10-09 ENCOUNTER — Encounter: Payer: Self-pay | Admitting: Physical Therapy

## 2022-10-09 DIAGNOSIS — M6281 Muscle weakness (generalized): Secondary | ICD-10-CM | POA: Insufficient documentation

## 2022-10-09 DIAGNOSIS — R262 Difficulty in walking, not elsewhere classified: Secondary | ICD-10-CM | POA: Insufficient documentation

## 2022-10-09 DIAGNOSIS — R531 Weakness: Secondary | ICD-10-CM | POA: Diagnosis not present

## 2022-10-09 DIAGNOSIS — R29898 Other symptoms and signs involving the musculoskeletal system: Secondary | ICD-10-CM | POA: Insufficient documentation

## 2022-10-09 NOTE — Therapy (Signed)
OUTPATIENT PHYSICAL THERAPY LOWER EXTREMITY EVALUATION   Patient Name: [name] MRN: [id number] DOB:March 24, 1951, 72 y.o., male Today's Date: 10/09/2022  END OF SESSION:  PT End of Session - 10/09/22 1441     Visit Number 1    Number of Visits 16    Date for PT Re-Evaluation 12/04/22    Authorization Type Medicare    Progress Note Due on Visit 10    PT Start Time 1445    PT Stop Time 1530    PT Time Calculation (min) 45 min    Activity Tolerance Patient tolerated treatment well             Past Medical History:  Diagnosis Date   Autoimmune disease (HCC)    Bronchitis    Chronic HFrEF (heart failure with reduced ejection fraction) (HCC)    normalized EF   Chronic hiccups    Chronic kidney disease, stage 3a (HCC)    DM2 (diabetes mellitus, type 2) (HCC) 09/28/2014   DVT (deep venous thrombosis) (HCC)    GERD (gastroesophageal reflux disease)    Hypogonadism in male 04/01/2018   Hypothyroidism    Joint pain    Necrotizing myopathy    Nonobstructive atherosclerosis of coronary artery    Posttraumatic stress disorder 01/30/2022   PTSD (post-traumatic stress disorder)    Pulmonary embolism (HCC)    Rheumatoid arthritis (HCC) 08/09/2021   RSV (respiratory syncytial virus pneumonia) 03/11/2022   Past Surgical History:  Procedure Laterality Date   BIOPSY SHOULDER     Left   IR ANGIOGRAM PULMONARY BILATERAL SELECTIVE  07/22/2021   IR THROMBECT VENO MECH MOD SED  07/22/2021   IR US GUIDE VASC ACCESS LEFT  07/22/2021   RADIOLOGY WITH ANESTHESIA Right 07/22/2021   Procedure: IR WITH ANESTHESIA;  Surgeon: Radiologist, Medication, MD;  Location: [facility] OR;  Service: Radiology;  Laterality: Right;   UPPER LEG SOFT TISSUE BIOPSY Right    Patient Active Problem List   Diagnosis Date Noted   Lymphedema 09/13/2022   Syncope and collapse 09/13/2022   Syncope 09/12/2022   Venous stasis ulcer without varicose veins (HCC) 09/09/2022   Anemia 09/09/2022   Skin ulcer of buttock (HCC)  09/09/2022   Generalized weakness 09/09/2022   Diarrhea 09/09/2022   Oropharyngeal dysphagia 04/23/2022   Leg swelling 04/23/2022   Confusion 04/23/2022   Type 2 diabetes mellitus with other diabetic ophthalmic complication (HCC) 04/16/2022   Shoulder pain 03/22/2022   Hypoglycemia 03/20/2022   Frequent falls 03/19/2022   Chronic combined systolic and diastolic CHF (congestive heart failure) (HCC) 03/11/2022   CKD (chronic kidney disease), stage III (HCC) 03/11/2022   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   History of DVT (deep vein thrombosis) 03/11/2022   History of pulmonary embolism 03/11/2022   Hyperlipidemia 03/11/2022   Obesity (BMI 30-39.9) 03/11/2022   CAD (coronary artery disease) 01/30/2022   Depression 01/30/2022   Cardiomyopathy (HCC) 01/30/2022   Posttraumatic stress disorder 01/30/2022   Rheumatoid arthritis (HCC) 08/09/2021   New onset of headaches after age 64 08/09/2021   Non-ST elevation (NSTEMI) myocardial infarction [facility])    Acute pulmonary embolism (HCC) 07/22/2021   Acute DVT (deep venous thrombosis) (HCC) 07/22/2021   Elevated troponin 07/22/2021   Anxiety 07/22/2021   Ankle swelling 01/17/2020   GERD (gastroesophageal reflux disease) 06/04/2018   Hypogonadism in male 04/01/2018   Macrocytic anemia 09/30/2017   Itching 09/30/2017   Hypothyroidism 09/30/2017   Low testosterone 09/30/2017   Benign prostatic hyperplasia without lower urinary tract symptoms 09/30/2017  Erectile dysfunction 03/19/2016   Benign essential HTN 08/08/2015   Branch retinal vein occlusion with macular edema of left eye 03/25/2015   Nuclear sclerotic cataract of both eyes 03/25/2015   Myositis 09/28/2014   DM2 (diabetes mellitus, type 2) (HCC) 09/28/2014   Abnormal glucose 10/04/2012    PCP: Everrett Coombe, DO  REFERRING PROVIDER: Everrett Coombe, DO  REFERRING DIAG: R53.1 (ICD-10-CM) - Generalized weakness  THERAPY DIAG:  Muscle weakness (generalized)  Difficulty in  walking, not elsewhere classified  Rationale for Evaluation and Treatment: Rehabilitation  ONSET DATE: worsening for ~1 year  SUBJECTIVE:   SUBJECTIVE STATEMENT: Pt has necrotizing myopathy (was in the [employer] and exposed to [employer]). Wife states his muscles are shredded and he has lymphedema. Wife reports L side is weaker. No CVA, seizure or heart attacks. Pt has a bike at home that he works on but no real exercise regimen. He has had PT in the past but has not maintained. Has wound on R LE from the lymphedema. Pt is having a hard time walking up stairs. Used to be able to use cane. Has been using a walker as both feet have drop foot. Has not been able to sleep upstairs. Pt reports 4 hospital admissions in the past year. Pt states he is only able to perform static standing for 1 min.   PERTINENT HISTORY: history of autoimmune myopathy, rheumatoid arthritis, diabetes mellitus type 2, DVT and PE on Eliquis, chronic combined CHF, nonobstructive CAD, chronic lymphedema, CKD stage IIIa, OSA, GERD, hypothyroidism  PAIN:  Are you having pain? Yes: NPRS scale: currently 4, up to 15/10 Pain location: Mostly in knees Pain description: aching Aggravating factors: intermittent Relieving factors: tylenol  PRECAUTIONS: None  WEIGHT BEARING RESTRICTIONS: No  FALLS:  Has patient fallen in last 6 months? Yes. Number of falls 5 -- happened during walking  LIVING ENVIRONMENT: Lives with: lives with their family and lives with their spouse son is home from college Lives in: House/apartment Stairs: Yes: Internal: 17 steps; on right going up and External: 4 in garage, 5 in front steps; none Has following equipment at home: Quad cane large base and manual wheelchair/rollator convertible  OCCUPATION: Retired Hotel manager  PLOF: Independent  PATIENT GOALS: Walk with the cane inside the house, less assist to go up/down stairs  NEXT MD VISIT: n/a  OBJECTIVE:   DIAGNOSTIC FINDINGS: nothing  recent  PATIENT SURVEYS:  LEFS 10%  COGNITION: Overall cognitive status: Within functional limits for tasks assessed     SENSATION: Notes history of neuropathy  EDEMA:  Lymphedema bilat   MUSCLE LENGTH: did not assess  POSTURE: rounded shoulders, increased thoracic kyphosis, and flexed trunk   PALPATION: Did not assess  LOWER EXTREMITY ROM:  Active ROM Right eval Left eval  Hip flexion    Hip extension    Hip abduction    Hip adduction    Hip internal rotation    Hip external rotation    Knee flexion    Knee extension    Ankle dorsiflexion    Ankle plantarflexion    Ankle inversion    Ankle eversion     (Blank rows = not tested)  LOWER EXTREMITY MMT:  MMT Right eval Left eval  Hip flexion 2+ 3-  Hip extension    Hip abduction 4- 4-  Hip adduction 4- 5  Hip internal rotation    Hip external rotation    Knee flexion 3+ 3+  Knee extension 2+ 3-  Ankle dorsiflexion  Ankle plantarflexion    Ankle inversion    Ankle eversion     (Blank rows = not tested)  LOWER EXTREMITY SPECIAL TESTS:  Did not assess  FUNCTIONAL TESTS:  5 times sit to stand: 40 sec TUG: 54 sec  GAIT: Distance walked: 100' Assistive device utilized: Environmental consultant - 4 wheeled Level of assistance: SBA Comments: Increased trunk flexion, decreased heel strike   TODAY'S TREATMENT:                                                                                                                              DATE: 10/09/22  See HEP below  PATIENT EDUCATION:  Education details: Exam findings, POC, initial HEP Person educated: Patient and Spouse Education method: Explanation, Demonstration, and Handouts Education comprehension: verbalized understanding, returned demonstration, and needs further education  HOME EXERCISE PROGRAM: Access Code: [email or web address] URL: [email or web address] Date: 10/09/2022 Prepared by: Vernon Prey April Kirstie Peri  Exercises - Seated Heel Slide  - 1 x  daily - 7 x weekly - 2-3 sets - 10 reps - Seated Long Arc Quad  - 1 x daily - 7 x weekly - 2-3 sets - 10 reps - Seated Ankle Pumps  - 1 x daily - 7 x weekly - 2-3 sets - 10 reps - Seated Isometric Hip Adduction with Ball  - 1 x daily - 7 x weekly - 2-3 sets - 10 reps - 5 sec hold - Seated Hip Abduction with Resistance  - 1 x daily - 7 x weekly - 2-3 sets - 10 reps - 5 sec hold  ASSESSMENT:  CLINICAL IMPRESSION: Patient is a 72 y.o. M who was seen today for physical therapy evaluation and treatment for generalized weakness. PMH significant for lymphedema, neuropathy and Agent [employer] exposure leading to myopathy. Assessment demos L UE weakness with R LE weakness leading to decreased balance, standing and walking tolerance. Pt demos high fall risk based on his 5x STS and TUG score. Pt would benefit from PT to improve his home mobility and improve limited community mobility  OBJECTIVE IMPAIRMENTS: Abnormal gait, decreased activity tolerance, decreased balance, decreased endurance, decreased mobility, difficulty walking, decreased ROM, decreased strength, increased muscle spasms, impaired flexibility, improper body mechanics, postural dysfunction, and pain.   ACTIVITY LIMITATIONS: standing, squatting, stairs, transfers, bathing, toileting, dressing, hygiene/grooming, and locomotion level  PARTICIPATION LIMITATIONS: meal prep, cleaning, laundry, driving, and shopping  PERSONAL FACTORS: Fitness, Past/current experiences, and Time since onset of injury/illness/exacerbation are also affecting patient's functional outcome.   REHAB POTENTIAL: Good  CLINICAL DECISION MAKING: Evolving/moderate complexity  EVALUATION COMPLEXITY: Moderate   GOALS: Goals reviewed with patient? Yes  SHORT TERM GOALS: Target date: 11/06/2022  Pt will be ind with initial HEP Baseline: Goal status: INITIAL  2.  Pt will have improved 5x STS to </=35 sec without UE support to demo increased functional LE  strength Baseline:  Goal status: INITIAL  3.  Pt will have TUG time to </=50 sec with  rollator to demo improving gait Baseline:  Goal status: INITIAL   LONG TERM GOALS: Target date: 12/04/2022   Pt will be ind with management and progression of HEP Baseline:  Goal status: INITIAL  2.  Pt will have TUG time to </=50 sec with SPC to demo improving gait for home Baseline:  Goal status: INITIAL  3.  Pt will be able to amb at least 400' with [facility] for home mobility Baseline:  Goal status: INITIAL  4.  Pt will be able to ascend/descend 17 steps with SPC to get to his bedroom  Baseline:  Goal status: INITIAL  5.  Pt will have improved LEFS to 20% to demo MCID Baseline:  Goal status: INITIAL   PLAN:  PT FREQUENCY: 2x/week  PT DURATION: 8 weeks  PLANNED INTERVENTIONS: Therapeutic exercises, Therapeutic activity, Neuromuscular re-education, Balance training, Gait training, Patient/Family education, Self Care, Joint mobilization, Stair training, Orthotic/Fit training, Aquatic Therapy, Dry Needling, Electrical stimulation, Cryotherapy, Moist heat, Taping, Vasopneumatic device, Ionotophoresis 4mg /ml Dexamethasone, Manual therapy, and Re-evaluation  PLAN FOR NEXT SESSION: Assess response to HEP. Continue to strengthen bilat LE and core as tolerated. Initiate balance/stability exercises. Work on gait/endurance.   Robert Sperl April Ma L Talib Headley, PT 10/09/2022, 4:39 PM

## 2022-10-09 NOTE — Progress Notes (Deleted)
Cardiology Office Note:  .   Date:  10/09/2022  ID:  [name], DOB [birth year], MRN [id number] PCP: Everrett Coombe, DO  [facility] Providers Cardiologist:  Chrystie Nose, MD { Click to update primary MD,subspecialty MD or APP then REFRESH:1}   History of Present Illness: .   [name] is a 72 y.o. male  with a hx of hypertension, DM2, nonobstructive CAD, autoimmune myositis, PTSD, PE, DVT, HFrEF, CKD 3 last seen 06/05/2022   He was admitted 07/21/2021 and acute PE as well as acute RLE DVT with acute hypoxic respiratory failure in the setting of testosterone injections were discontinued.  Started on heparin and transitioned to Eliquis.  Recommended for indefinite OAC is not triggered.  Echo  07/23/2021 with LVEF 35-40%, wall motion abnormalities noted, grade 1 diastolic dysfunction, trivial MR, mild AI, mild dilation ascending aorta 41 mm.  Due to AKI he was recommended for outpatient ischemic evaluation.   Seen 08/14/2021 Imdur discontinued due to patient report of headache.  Cardiac CTA 08/28/2021 coronary calcium score of 129 placing him in the 64th percentile for age, gender. LM 1-24% stenosis and ostial/prox LAD mild 25-49% stenosis (FFR 0.88 prox and 0.82 mid - no significant stenosis). For optimization of therapy, 10/09/21 Spironolactone added and 10/27/21 Hydralazine transitioned to [place]. Repeat echo 12/20/21 LVEF 55-60%, stable wall motion abnormalities, gr1DD.   01/09/2022 and Lasix reduced to only as needed due to recovered LVEF and Entresto reduced to half tablet twice daily due to hypotension.  Underwent sleep study 02/20/2022 with moderate OSA recommended for CPAP titration.   Admitted 11/12 - 03/15/2022 with AMS found to have PNA as well as HF exacerbation requiring diuresis.  Discharged on torsemide.  Entresto discontinued due to hypotension.  Carvedilol reduced.  Echo during that admission with LVEF 55 to 60%, grade 1 diastolic dysfunction, aortic valve sclerosis without  stenosis, mild dilation aortic root 41 mm.   Readmitted 11/20 - 03/24/2022 with acute metabolic encephalopathy with hypoglycemia, dehydration.  No evidence of infection.  Spironolactone, torsemide were discontinued. Torsemide later resumed 03/29/22 due to pitting edema.     He saw primary care 04/16/22 noting confusion, increased edema, gait unsteadiness, hoarseness. MR brain, CXR, LE duplex nonacute. He was recommended to see neurology.    Seen 06/05/2022 and due to bilateral 2+ pitting edema was recommended to transition to torsemide 40 mg daily for 3 days then return to 20 mg daily.  Spironolactone 12.5 mg was added.  However he did not stay on long-term.   Admitted 5/15 - 09/15/2022 after loss of consciousness with stiffening, eyes rolling back with concern for seizure for syncope.  CT and MRI of brain with no acute intracranial abnormality.  EEG negative for seizures.  He was recommended for CIR but declined.  MR cervical/thoracic and lumbar spine with varying degrees of foraminal stenosis.  2D echo LVEF 60 to 65%.  Recommended to continue torsemide 20 mg daily.  ZIO monitor placed on discharge.  Presents today for follow up. ***  ROS: ***  Studies Reviewed: Marland Kitchen    EKG:  ***  *** Risk Assessment/Calculations:     No BP recorded.  {Refresh Note OR Click here to enter BP  :1}***   STOP-Bang Score:  5  { Consider Dx Sleep Disordered Breathing or Sleep Apnea  ICD G47.33          :1}    Physical Exam:   VS:  There were no vitals taken for this visit.   Wt  Readings from Last 3 Encounters:  09/15/22 207 lb 14.3 oz (94.3 kg)  09/05/22 207 lb 14.3 oz (94.3 kg)  08/26/22 208 lb (94.3 kg)    GEN: Well nourished, well developed in no acute distress NECK: No JVD; No carotid bruits CARDIAC: ***RRR, no murmurs, rubs, gallops RESPIRATORY:  Clear to auscultation without rales, wheezing or rhonchi  ABDOMEN: Soft, non-tender, non-distended EXTREMITIES:  No edema; No deformity   ASSESSMENT AND  PLAN: .   ***    {Are you ordering a CV Procedure (e.g. stress test, cath, DCCV, TEE, etc)?   Press F2        :[id number]}  Dispo: ***  Signed, Alver Sorrow, NP

## 2022-10-10 NOTE — Progress Notes (Signed)
[name], [name] ([id number]) [id number] Page 1 of 12 Visit Report for 10/04/2022 Allergy List Details Patient Name: Date of Service: [name], [name] 10/04/2022 8:00 A M Medical Record Number: [id number] Patient Account Number: [id number] Date of Birth/Sex: Treating RN: Dec 13, 1950 (72 y.o. Dianna Limbo Primary Care Mckinna Demars: Everrett Coombe Other Clinician: Referring Ellarae Nevitt: Treating Karianna Gusman/Extender: Shawna Clamp in Treatment: 0 Allergies Active Allergies benazepril gabapentin Hyrocodone Type: Medication Shellfish Containing Products Allergy Notes Electronic Signature(s) Signed: 10/04/2022 6:33:35 PM By: Karie Schwalbe RN Entered By: Karie Schwalbe on 10/04/2022 08:18:56 -------------------------------------------------------------------------------- Arrival Information Details Patient Name: Date of Service: [name] 10/04/2022 8:00 A M Medical Record Number: [id number] Patient Account Number: [id number] Date of Birth/Sex: Treating RN: 03/23/1951 (72 y.o. Dianna Limbo Primary Care Deedra Pro: Everrett Coombe Other Clinician: Referring Citlally Captain: Treating Beauford Lando/Extender: Shawna Clamp in Treatment: 0 Visit Information Patient Arrived: Wheel Chair Arrival Time: 08:10 Accompanied By: spouse Transfer Assistance: Manual Patient Identification Verified: Yes Patient Has Alerts: Yes Patient Alerts: ELIQUIS Electronic Signature(s) Signed: 10/04/2022 6:33:35 PM By: Karie Schwalbe RN Entered By: Karie Schwalbe on 10/04/2022 08:50:29 -------------------------------------------------------------------------------- Clinic Level of Care Assessment Details Patient Name: Date of Service: [name], [name] 10/04/2022 8:00 A M Medical Record Number: [id number] Patient Account Number: [id number] Date of Birth/Sex: Treating RN: 1951/03/19 (72 y.o. Dianna Limbo Primary Care Kahari Critzer: Everrett Coombe Other  Clinician: Referring Chi Woodham: Treating Ridwan Bondy/Extender: Shawna Clamp in Treatment: 0 Clinic Level of Care Assessment Items TOOL 1 Quantity Score CARODINE, Aadvik ([id number]) [id number] Page 2 of 12 X- 1 0 Use when EandM and Procedure [name] performed on INITIAL visit ASSESSMENTS - Nursing Assessment / Reassessment X- 1 20 General Physical Exam (combine w/ comprehensive assessment (listed just below) when performed on new pt. evals) X- 1 25 Comprehensive Assessment (HX, ROS, Risk Assessments, Wounds Hx, etc.) ASSESSMENTS - Wound and Skin Assessment / Reassessment X- 1 10 Dermatologic / Skin Assessment (not related [name] wound area) ASSESSMENTS - Ostomy and/or Continence Assessment and Care []  - 0 Incontinence Assessment and Management []  - 0 Ostomy Care Assessment and Management (repouching, etc.) PROCESS - Coordination of Care []  - 0 Simple Patient / Family Education for ongoing care X- 1 20 Complex (extensive) Patient / Family Education for ongoing care X- 1 10 Staff obtains Chiropractor, Records, T Results / Process Orders est X- 1 10 Staff telephones HHA, Nursing Homes / Clarify orders / etc []  - 0 Routine Transfer [name] another Facility (non-emergent condition) []  - 0 Routine Hospital Admission (non-emergent condition) X- 1 15 New Admissions / Manufacturing engineer / Ordering NPWT Apligraf, etc. , []  - 0 Emergency Hospital Admission (emergent condition) PROCESS - Special Needs []  - 0 Pediatric / Minor Patient Management []  - 0 Isolation Patient Management []  - 0 Hearing / Language / Visual special needs []  - 0 Assessment of Community assistance (transportation, D/C planning, etc.) []  - 0 Additional assistance / Altered mentation []  - 0 Support Surface(s) Assessment (bed, cushion, seat, etc.) INTERVENTIONS - Miscellaneous []  - 0 External ear exam []  - 0 Patient Transfer (multiple staff / Nurse, adult / Similar  devices) []  - 0 Simple Staple / Suture removal (25 or less) []  - 0 Complex Staple / Suture removal (26 or more) []  - 0 Hypo/Hyperglycemic Management (do not check if billed separately) []  - 0 Ankle / Brachial Index (ABI) - do not check if billed separately Has the patient been seen at the hospital within the last three years: Yes Total  Score: 110 Level Of Care: New/Established - Level 3 Electronic Signature(s) Signed: 10/09/2022 5:34:30 PM By: Karie Schwalbe RN Previous Signature: 10/04/2022 6:33:35 PM Version By: Karie Schwalbe RN Entered By: Karie Schwalbe on 10/09/2022 16:33:09 -------------------------------------------------------------------------------- Encounter Discharge Information Details Patient Name: Date of Service: [name], [name] 10/04/2022 8:00 A M Medical Record Number: [id number] Patient Account Number: [id number] Date of Birth/Sex: Treating RN: Jul 04, 1950 (72 y.o. Dianna Limbo Primary Care [place]: Everrett Coombe Other Clinician: Referring Alaysha Jefcoat: Treating Jilene Spohr/Extender: Shawna Clamp in Treatment: 0 Encounter Discharge Information Items Discharge Condition: [place], [name] ([id number]) [id number] Page 3 of 12 Ambulatory Status: Wheelchair Discharge Destination: Home Transportation: Private Auto Accompanied By: spouse Schedule Follow-up Appointment: Yes Clinical Summary of Care: Patient Declined Electronic Signature(s) Signed: 10/04/2022 6:33:35 PM By: Karie Schwalbe RN Entered By: Karie Schwalbe on 10/04/2022 18:30:01 -------------------------------------------------------------------------------- Lower Extremity Assessment Details Patient Name: Date of Service: [name], [name] 10/04/2022 8:00 A M Medical Record Number: [id number] Patient Account Number: [id number] Date of Birth/Sex: Treating RN: January 16, 1951 (72 y.o. Dianna Limbo Primary Care Charleton Deyoung: Everrett Coombe Other  Clinician: Referring Jacquise Rarick: Treating Briley Bumgarner/Extender: Shawna Clamp in Treatment: 0 Edema Assessment Assessed: [name]: No] [Right: No] Edema: [Left: Yes] [Right: Yes] Calf Left: Right: Point of Measurement: 37 cm From Medial Instep 45.3 cm 45 cm Ankle Left: Right: Point of Measurement: 10 cm From Medial Instep 29 cm 29 cm Knee [name] Floor Left: Right: From Medial Instep 45 cm 45 cm Vascular Assessment Pulses: Dorsalis Pedis Palpable: [Left:Yes] [Right:Yes] Blood Pressure: Brachial: [Left:147] [Right:147] Ankle: [Left:Dorsalis Pedis: 190 1.29] [Right:Dorsalis Pedis: 180 1.22] Electronic Signature(s) Signed: 10/04/2022 6:33:35 PM By: Karie Schwalbe RN Entered By: Karie Schwalbe on 10/04/2022 08:57:35 -------------------------------------------------------------------------------- Multi-Disciplinary Care Plan Details Patient Name: Date of Service: [name] 10/04/2022 8:00 A M Medical Record Number: [id number] Patient Account Number: [id number] Date of Birth/Sex: Treating RN: 07-05-1950 (72 y.o. Dianna Limbo Primary Care Roselynn Whitacre: Everrett Coombe Other Clinician: Referring Thor Nannini: Treating Mckynna Vanloan/Extender: Shawna Clamp in Treatment: 0 Active Inactive Wound/Skin Impairment [place], [name] ([id number]) [id number] Page 4 of 12 Nursing Diagnoses: Impaired tissue integrity Goals: Patient/caregiver will verbalize understanding of skin care regimen Date Initiated: 10/04/2022 Target Resolution Date: 12/29/2022 Goal Status: Active Interventions: Assess ulceration(s) every visit Treatment Activities: Skin care regimen initiated : 10/04/2022 Notes: Electronic Signature(s) Signed: 10/04/2022 6:33:35 PM By: Karie Schwalbe RN Entered By: Karie Schwalbe on 10/04/2022 18:24:08 -------------------------------------------------------------------------------- Pain Assessment Details Patient Name: Date of  Service: BIRCH, FARINO 10/04/2022 8:00 A M Medical Record Number: [id number] Patient Account Number: [id number] Date of Birth/Sex: Treating RN: Jan 03, 1951 (72 y.o. Dianna Limbo Primary Care Merilyn Pagan: Everrett Coombe Other Clinician: Referring Chrissi Crow: Treating Robyn Nohr/Extender: Shawna Clamp in Treatment: 0 Active Problems Location of Pain Severity and Description of Pain Patient Has Paino Yes Site Locations Pain Location: Generalized Pain With Dressing Change: No Duration of the Pain. Constant / Intermittento Constant Rate the pain. Current Pain Level: 5 Worst Pain Level: 10 Least Pain Level: 3 Tolerable Pain Level: 5 Character of Pain Describe the Pain: Difficult [name] Pinpoint, Sharp, Throbbing Pain Management and Medication Current Pain Management: Medication: Yes Cold Application: No Rest: Yes Massage: No Activity: No T.E.N.S.: No Heat Application: No Leg drop or elevation: No [name] the Current Pain Management Adequate: Adequate How does your wound impact your activities of daily livingo Sleep: No Bathing: No Appetite: No Relationship With Others: No Bladder Continence: No Emotions: No Bowel Continence: No Work: No Toileting: No  Drive: No Dressing: No Hobbies: No [place], Amaar ([id number]) [id number] Page 5 of 12 Electronic Signature(s) Signed: 10/04/2022 6:33:35 PM By: Karie Schwalbe RN Entered By: Karie Schwalbe on 10/04/2022 08:52:16 -------------------------------------------------------------------------------- Patient/Caregiver Education Details Patient Name: Date of Service: [name], [name] 6/6/2024andnbsp8:00 A M Medical Record Number: [id number] Patient Account Number: [id number] Date of Birth/Gender: Treating RN: 1950/08/23 (72 y.o. Dianna Limbo Primary Care Physician: Everrett Coombe Other Clinician: Referring Physician: Treating Physician/Extender: Shawna Clamp in  Treatment: 0 Education Assessment Education Provided [name]: Patient Education Topics Provided Wound/Skin Impairment: Methods: Explain/Verbal Responses: Return demonstration correctly Electronic Signature(s) Signed: 10/04/2022 6:33:35 PM By: Karie Schwalbe RN Entered By: Karie Schwalbe on 10/04/2022 18:24:20 -------------------------------------------------------------------------------- Wound Assessment Details Patient Name: Date of Service: [name], [name] 10/04/2022 8:00 A M Medical Record Number: [id number] Patient Account Number: [id number] Date of Birth/Sex: Treating RN: [birth year] (72 y.o. Dianna Limbo Primary Care Jaxzen Vanhorn: Everrett Coombe Other Clinician: Referring Benjerman Molinelli: Treating Avangeline Stockburger/Extender: Shawna Clamp in Treatment: 0 Wound Status Wound Number: 1 Primary T be determined o Etiology: Wound Location: Left T Second oe Wound Healed - Epithelialized Wounding Event: Gradually Appeared Status: Date Acquired: 09/20/2022 Comorbid Anemia, Lymphedema, Congestive Heart Failure, Deep Vein Weeks Of Treatment: 0 History: Thrombosis, Type II Diabetes, Rheumatoid Arthritis Clustered Wound: No Photos Wound Measurements Length: (cm) Width: (cm) Depth: (cm) Area: (cm) Volume: (cm) Pulliam, Raoul ([id number]) 0 % Reduction in Area: 0 % Reduction in Volume: 0 Epithelialization: Large (67-100%) 0 Tunneling: No 0 Undermining: No [id number] Page 6 of 12 Wound Description Classification: Full Thickness Without Exposed Support Structures Exudate Amount: None Present Foul Odor After Cleansing: No Slough/Fibrino No Wound Bed Granulation Amount: None Present (0%) Exposed Structure Necrotic Amount: None Present (0%) Fascia Exposed: No Fat Layer (Subcutaneous Tissue) Exposed: No Tendon Exposed: No Muscle Exposed: No Joint Exposed: No Bone Exposed: No Periwound Skin Texture Texture Color No Abnormalities Noted: Yes No  Abnormalities Noted: Yes Moisture Temperature / Pain No Abnormalities Noted: Yes Temperature: No Abnormality Electronic Signature(s) Signed: 10/04/2022 6:33:35 PM By: Karie Schwalbe RN Entered By: Karie Schwalbe on 10/04/2022 09:38:19 -------------------------------------------------------------------------------- Wound Assessment Details Patient Name: Date of Service: [name], [name] 10/04/2022 8:00 A M Medical Record Number: [id number] Patient Account Number: [id number] Date of Birth/Sex: Treating RN: Nov 05, 1950 (72 y.o. Dianna Limbo Primary Care Babygirl Trager: Everrett Coombe Other Clinician: Referring Gerrick Ray: Treating Dao Memmott/Extender: Shawna Clamp in Treatment: 0 Wound Status Wound Number: 2 Primary Lymphedema Etiology: Wound Location: Right, Anterior Lower Leg Wound Open Wounding Event: Gradually Appeared Status: Date Acquired: 09/02/2022 Comorbid Anemia, Congestive Heart Failure, Deep Vein Thrombosis, Type Weeks Of Treatment: 0 History: II Diabetes, Rheumatoid Arthritis Clustered Wound: No Photos Wound Measurements Length: (cm) 2.2 Width: (cm) 0.7 Depth: (cm) 0.1 Area: (cm) 1.21 Volume: (cm) 0.121 % Reduction in Area: % Reduction in Volume: Epithelialization: None Tunneling: No Undermining: No Wound Description Classification: Full Thickness Without Exposed Support Wound Margin: Distinct, outline attached Exudate Amount: Medium Exudate Type: Serous Exudate Color: amber Swiger, Tyrie ([id number]) Wound Bed Granulation Amount: Large (67-10 Granulation Quality: Red Necrotic Amount: Small (1-33% Necrotic Quality: Adherent Slo Structures Foul Odor After Cleansing: No Slough/Fibrino Yes [id number] Page 7 of 12 0%) Exposed Structure Fat Layer (Subcutaneous Tissue) Exposed: Yes ) ugh Periwound Skin Texture Texture Color No Abnormalities Noted: Yes No Abnormalities Noted: Yes Moisture Temperature / Pain No  Abnormalities Noted: Yes Temperature: No Abnormality Treatment Notes Wound #2 (Lower Leg) Wound Laterality: Right, Anterior Cleanser Soap and Water Discharge Instruction:  May shower and wash wound with dial antibacterial soap and water prior [name] dressing change. Vashe 5.8 (oz) Discharge Instruction: or Cleanse the wound with Vashe prior [name] applying a clean dressing using gauze sponges, not tissue or cotton balls. Wound Cleanser Discharge Instruction: or Cleanse the wound with wound cleanser prior [name] applying a clean dressing using gauze sponges, not tissue or cotton balls. Peri-Wound Care Topical Primary Dressing Maxorb Extra Ag+ Alginate Dressing, 4x4.75 (in/in) Discharge Instruction: Apply [name] wound bed as instructed Secondary Dressing ABD Pad, 8x10 Discharge Instruction: Apply over primary dressing as directed. Woven Gauze Sponge, Non-Sterile 4x4 in Discharge Instruction: Apply over primary dressing as directed. Secured With Paper Tape, 2x10 (in/yd) Discharge Instruction: Secure dressing with tape as directed. Compression Wrap FourPress (4 layer compression wrap) Discharge Instruction: Apply four layer compression as directed. May also use Urgo K2 compression system as alternative. Stockinette Compression Stockings Facilities manager) Signed: 10/04/2022 6:21:23 PM By: Shawn Stall RN, BSN Signed: 10/04/2022 6:33:35 PM By: Karie Schwalbe RN Entered By: Shawn Stall on 10/04/2022 09:04:30 -------------------------------------------------------------------------------- Wound Assessment Details Patient Name: Date of Service: [name], [name] 10/04/2022 8:00 A M Medical Record Number: [id number] Patient Account Number: [id number] Date of Birth/Sex: Treating RN: October 03, 1950 (72 y.o. Dianna Limbo Primary Care Winslow Verrill: Everrett Coombe Other Clinician: Referring Caisen Mangas: Treating Lennis Rader/Extender: Shawna Clamp in Treatment: 0 Wound  Status Wound Number: 3 Primary Lymphedema Etiology: Wound Location: Right, Lateral Lower Leg Wound Open Wounding Event: Gradually Appeared Status: [place], [name] ([id number]) [id number] Page 8 of 12 Status: Date Acquired: 09/02/2022 Comorbid Anemia, Congestive Heart Failure, Deep Vein Thrombosis, Type Weeks Of Treatment: 0 History: II Diabetes, Rheumatoid Arthritis Clustered Wound: No Photos Wound Measurements Length: (cm) 3.3 Width: (cm) 4 Depth: (cm) 0.1 Area: (cm) 10.367 Volume: (cm) 1.037 % Reduction in Area: % Reduction in Volume: Epithelialization: None Tunneling: No Undermining: No Wound Description Classification: Full Thickness Without Exposed Support Structures Exudate Amount: Medium Exudate Type: Serosanguineous Exudate Color: red, brown Foul Odor After Cleansing: No Slough/Fibrino Yes Wound Bed Granulation Amount: Large (67-100%) Exposed Structure Granulation Quality: Red Fascia Exposed: No Necrotic Amount: Small (1-33%) Fat Layer (Subcutaneous Tissue) Exposed: No Necrotic Quality: Adherent Slough Tendon Exposed: No Muscle Exposed: No Joint Exposed: No Bone Exposed: No Periwound Skin Texture Texture Color No Abnormalities Noted: Yes No Abnormalities Noted: Yes Moisture Temperature / Pain No Abnormalities Noted: Yes Temperature: No Abnormality Treatment Notes Wound #3 (Lower Leg) Wound Laterality: Right, Lateral Cleanser Soap and Water Discharge Instruction: May shower and wash wound with dial antibacterial soap and water prior [name] dressing change. Vashe 5.8 (oz) Discharge Instruction: or Cleanse the wound with Vashe prior [name] applying a clean dressing using gauze sponges, not tissue or cotton balls. Wound Cleanser Discharge Instruction: or Cleanse the wound with wound cleanser prior [name] applying a clean dressing using gauze sponges, not tissue or cotton balls. Peri-Wound Care Topical Primary Dressing Maxorb Extra Ag+  Alginate Dressing, 4x4.75 (in/in) Discharge Instruction: Apply [name] wound bed as instructed Secondary Dressing ABD Pad, 8x10 Discharge Instruction: Apply over primary dressing as directed. Woven Gauze Sponge, Non-Sterile 4x4 in Discharge Instruction: Apply over primary dressing as directed. GENNARO, SHANAFELT ([id number]) [id number] Page 9 of 12 Secured With Paper Tape, 2x10 (in/yd) Discharge Instruction: Secure dressing with tape as directed. Compression Wrap FourPress (4 layer compression wrap) Discharge Instruction: Apply four layer compression as directed. May also use Urgo K2 compression system as alternative. Stockinette Compression Stockings Facilities manager) Signed: 10/04/2022 6:21:23 PM By: Shawn Stall RN,  BSN Signed: 10/04/2022 6:33:35 PM By: Karie Schwalbe RN Entered By: Shawn Stall on 10/04/2022 09:05:00 -------------------------------------------------------------------------------- Wound Assessment Details Patient Name: Date of Service: [name], [name] 10/04/2022 8:00 A M Medical Record Number: [id number] Patient Account Number: [id number] Date of Birth/Sex: Treating RN: May 16, 1950 (72 y.o. Dianna Limbo Primary Care Anne-Marie Genson: Everrett Coombe Other Clinician: Referring Meesha Sek: Treating Kalysta Kneisley/Extender: Shawna Clamp in Treatment: 0 Wound Status Wound Number: 4 Primary Pressure Ulcer Etiology: Wound Location: Right Gluteus Wound Open Wounding Event: Gradually Appeared Status: Date Acquired: 09/02/2022 Comorbid Anemia, Lymphedema, Congestive Heart Failure, Deep Vein Weeks Of Treatment: 0 History: Thrombosis, Type II Diabetes, Rheumatoid Arthritis Clustered Wound: No Photos Wound Measurements Length: (cm) 2.5 Width: (cm) 1.4 Depth: (cm) 0.1 Area: (cm) 2.749 Volume: (cm) 0.275 % Reduction in Area: % Reduction in Volume: Epithelialization: None Tunneling: No Undermining: No Wound  Description Classification: Category/Stage II Exudate Amount: Medium Exudate Type: Serosanguineous Exudate Color: red, brown Foul Odor After Cleansing: No Slough/Fibrino Yes Wound Bed Granulation Amount: Medium (34-66%) Exposed Structure Granulation Quality: Red Fascia Exposed: No Necrotic Amount: Medium (34-66%) Fat Layer (Subcutaneous Tissue) Exposed: Yes Necrotic Quality: Adherent Slough Tendon Exposed: No Muscle Exposed: No Joint Exposed: No Bone Exposed: No Belli, Yadiel ([id number]) [id number] Page 10 of 12 Periwound Skin Texture Texture Color No Abnormalities Noted: Yes No Abnormalities Noted: Yes Moisture Temperature / Pain No Abnormalities Noted: Yes Temperature: No Abnormality Treatment Notes Wound #4 (Gluteus) Wound Laterality: Right Cleanser Soap and Water Discharge Instruction: May shower and wash wound with dial antibacterial soap and water prior [name] dressing change. Vashe 5.8 (oz) Discharge Instruction: or Cleanse the wound with Vashe prior [name] applying a clean dressing using gauze sponges, not tissue or cotton balls. Wound Cleanser Discharge Instruction: or Cleanse the wound with wound cleanser prior [name] applying a clean dressing using gauze sponges, not tissue or cotton balls. Peri-Wound Care Topical Primary Dressing Maxorb Extra Calcium Alginate, 2x2 (in/in) Discharge Instruction: Apply [name] wound bed as instructed Secondary Dressing Bordered Gauze, 2x2 in Discharge Instruction: Apply over primary dressing as directed. Secured With Compression Wrap Compression Stockings Facilities manager) Signed: 10/04/2022 6:33:35 PM By: Karie Schwalbe RN Entered By: Karie Schwalbe on 10/04/2022 09:35:49 -------------------------------------------------------------------------------- Wound Assessment Details Patient Name: Date of Service: [name], [name] 10/04/2022 8:00 A M Medical Record Number: [id number] Patient Account Number:  [id number] Date of Birth/Sex: Treating RN: [birth year] (72 y.o. Dianna Limbo Primary Care Estella Malatesta: Everrett Coombe Other Clinician: Referring Otila Starn: Treating [place] Ament/Extender: Shawna Clamp in Treatment: 0 Wound Status Wound Number: 5 Primary Pressure Ulcer Etiology: Wound Location: Left Gluteus Wound Open Wounding Event: Gradually Appeared Status: Date Acquired: 09/02/2022 Comorbid Anemia, Lymphedema, Congestive Heart Failure, Deep Vein Weeks Of Treatment: 0 History: Thrombosis, Type II Diabetes, Rheumatoid Arthritis Clustered Wound: No Photos Ihnen, [name] ([id number]) [id number] Page 11 of 12 Wound Measurements Length: (cm) 2.9 Width: (cm) 1.1 Depth: (cm) 0.1 Area: (cm) 2.505 Volume: (cm) 0.251 % Reduction in Area: % Reduction in Volume: Epithelialization: None Tunneling: No Undermining: No Wound Description Classification: Category/Stage II Wound Margin: Distinct, outline attached Exudate Amount: Medium Exudate Type: Serosanguineous Exudate Color: red, brown Foul Odor After Cleansing: No Slough/Fibrino No Wound Bed Granulation Amount: Small (1-33%) Exposed Structure Granulation Quality: Pink Fascia Exposed: No Necrotic Amount: Large (67-100%) Fat Layer (Subcutaneous Tissue) Exposed: Yes Necrotic Quality: Adherent Slough Tendon Exposed: No Muscle Exposed: No Joint Exposed: No Bone Exposed: No Periwound Skin Texture Texture Color No Abnormalities Noted: Yes No Abnormalities Noted: Yes Moisture Temperature /  Pain No Abnormalities Noted: Yes Temperature: No Abnormality Treatment Notes Wound #5 (Gluteus) Wound Laterality: Left Cleanser Soap and Water Discharge Instruction: May shower and wash wound with dial antibacterial soap and water prior [name] dressing change. Vashe 5.8 (oz) Discharge Instruction: or Cleanse the wound with Vashe prior [name] applying a clean dressing using gauze sponges, not tissue or  cotton balls. Wound Cleanser Discharge Instruction: or Cleanse the wound with wound cleanser prior [name] applying a clean dressing using gauze sponges, not tissue or cotton balls. Peri-Wound Care Topical Primary Dressing Maxorb Extra Calcium Alginate, 2x2 (in/in) Discharge Instruction: Apply [name] wound bed as instructed Secondary Dressing Bordered Gauze, 2x2 in Discharge Instruction: Apply over primary dressing as directed. Secured With Compression Wrap Compression Stockings Add-Ons [name], [place] ([id number]) [id number] Page 12 of 12 Electronic Signature(s) Signed: 10/04/2022 6:33:35 PM By: Karie Schwalbe RN Entered By: Karie Schwalbe on 10/04/2022 09:36:12 -------------------------------------------------------------------------------- Vitals Details Patient Name: Date of Service: [name] 10/04/2022 8:00 A M Medical Record Number: [id number] Patient Account Number: [id number] Date of Birth/Sex: Treating RN: 1950/05/26 (72 y.o. Dianna Limbo Primary Care Catia Todorov: Everrett Coombe Other Clinician: Referring Jamariyah Johannsen: Treating Nevaen Tredway/Extender: Shawna Clamp in Treatment: 0 Vital Signs Time Taken: 08:12 Temperature (F): 98.7 Height (in): 68 Pulse (bpm): 78 Source: Stated Respiratory Rate (breaths/min): 18 Weight (lbs): 220 Blood Pressure (mmHg): 147/80 Source: Stated Reference Range: 80 - 120 mg / dl Body Mass Index (BMI): 33.4 Electronic Signature(s) Signed: 10/04/2022 6:33:35 PM By: Karie Schwalbe RN Entered By: Karie Schwalbe on 10/04/2022 08:51:16

## 2022-10-15 ENCOUNTER — Encounter (HOSPITAL_BASED_OUTPATIENT_CLINIC_OR_DEPARTMENT_OTHER): Payer: Medicare Other | Admitting: Internal Medicine

## 2022-10-15 DIAGNOSIS — L89322 Pressure ulcer of left buttock, stage 2: Secondary | ICD-10-CM | POA: Diagnosis not present

## 2022-10-15 DIAGNOSIS — I890 Lymphedema, not elsewhere classified: Secondary | ICD-10-CM

## 2022-10-15 DIAGNOSIS — E11622 Type 2 diabetes mellitus with other skin ulcer: Secondary | ICD-10-CM | POA: Diagnosis not present

## 2022-10-15 DIAGNOSIS — L89312 Pressure ulcer of right buttock, stage 2: Secondary | ICD-10-CM

## 2022-10-15 DIAGNOSIS — L97818 Non-pressure chronic ulcer of other part of right lower leg with other specified severity: Secondary | ICD-10-CM

## 2022-10-15 DIAGNOSIS — E039 Hypothyroidism, unspecified: Secondary | ICD-10-CM | POA: Diagnosis not present

## 2022-10-15 NOTE — Progress Notes (Addendum)
[name], [name] ([id number]) [id number] Page 1 of 9 Visit Report for 10/15/2022 Chief Complaint Document Details Patient Name: Date of Service: [name], [name] 10/15/2022 2:00 PM Medical Record Number: [id number] Patient Account Number: [id number] Date of Birth/Sex: Treating RN: 1950/06/06 (72 y.o. M) Primary Care Provider: Everrett [name] Other Clinician: Referring Provider: Treating Provider/Extender: [name] in Treatment: 1 Information Obtained from: Patient Chief Complaint 10/04/2022; patient is here for review of multiple wounds Electronic Signature(s) Signed: 10/15/2022 4:37:01 PM By: [name] Corwin DO Entered By: [name] on 10/15/2022 15:13:18 -------------------------------------------------------------------------------- HPI Details Patient Name: Date of Service: [name] 10/15/2022 2:00 PM Medical Record Number: [id number] Patient Account Number: [id number] Date of Birth/Sex: Treating RN: 1950/10/11 (72 y.o. M) Primary Care Provider: Everrett [name] Other Clinician: Referring Provider: Treating Provider/Extender: [name] in Treatment: 1 History of Present Illness HPI Description: ADMISSION 10/04/2022 This is a 72 year old man who is quite disabled because of some form of myopathy. He follows for this with neurology. He has had biopsies that showed necrosis without inflammation. 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 In any case he is reasonably immobile at home. He has this apparatus to help him transfer. Because of difficulties getting him upstairs he has been sleeping in a recliner on the main floor. He also has a history of lymphedema and I see he was followed by PT for a period of time. He has a history of combined congestive heart failure but his last echo showed an EF of 60 to  65%. He has developed a wound on the right lateral leg over the last month or so. He also has a smaller wound on the right anterior lower leg. He is having problems with his left second toe and he has bilateral stage II ulcers over his ischial tuberosity. The patient is complaining of oral pain under his tongue. He has bilateral heel pain and increasing edema with weight gain over the past week or 2 Past medical history includes myopathy/myositis question inclusion body myositis, rheumatoid arthritis, coronary artery disease, massive pulmonary embolism, type 2 diabetes, lymphedema, combined congestive heart failure, bilateral foot drop PTSD ABIs in our clinic were 1.22 on the right 1.27 on the left 6/17; patient presents for follow-up. He has been using silver alginate to the bilateral buttocks wounds. These have healed. We have also been using silver alginate to the right lower extremity under compression therapy. He is tolerated this well. The wounds are smaller. Electronic Signature(s) Signed: 10/15/2022 4:37:01 PM By: [name] Corwin DO Entered By: [name] on 10/15/2022 15:13:59 [name], [name] ([id number]) [id number] Page 2 of 9 -------------------------------------------------------------------------------- Physical Exam Details Patient Name: Date of Service: [name], [name] 10/15/2022 2:00 PM Medical Record Number: [id number] Patient Account Number: [id number] Date of Birth/Sex: Treating RN: 10/15/1950 (72 y.o. M) Primary Care Provider: Everrett [name] Other Clinician: Referring Provider: Treating Provider/Extender: [name] in Treatment: 1 Constitutional respirations regular, non-labored and within target range for patient.. Cardiovascular 2+ dorsalis pedis/posterior tibialis pulses. Psychiatric pleasant and cooperative. Notes T the buttocks bilaterally there is epithelization to the previous wound site o Right lower  extremity: T the anterior and lateral aspect there are 2 open wounds with granulation tissue throughout. Good edema control. o Electronic Signature(s) Signed: 10/15/2022 4:37:01 PM By: [name] Corwin DO Entered By: [name] on 10/15/2022 15:14:47 -------------------------------------------------------------------------------- Physician Orders Details Patient Name: Date of  Service: [name] 10/15/2022 2:00 PM Medical Record Number: [id number] Patient Account Number: [id number] Date of Birth/Sex: Treating RN: 11-May-1950 (72 y.o. [name] Primary Care Provider: Everrett [name] Other Clinician: Referring Provider: Treating Provider/Extender: [name] in Treatment: 1 Verbal / Phone Orders: No Diagnosis Coding Follow-up Appointments ppointment in 1 week. - +++ Extra time Room 5/Hoyer ++++Dr. Sharetha [name]/Dr. Leanord [name] Return A Anesthetic (In clinic) Topical Lidocaine 4% applied to wound bed - Used in Clinic Bathing/ Shower/ Hygiene May shower and wash wound with soap and water. - Please do not get right leg wet with compression wraps. Please keep wrap on all week. Can use Cast Protector-purchase at [employer] etc. Edema Control - Lymphedema / SCD / Other Bilateral Lower Extremities Elevate legs to the level of the heart or above for 30 minutes daily and/or when sitting for 3-4 times a day throughout the day. Avoid standing for long periods of time. Exercise regularly Moisturize legs daily. Other Edema Control Orders/Instructions: - Wear own compression garments on left leg . T off at night ake Off-Loading Other: - When sitting or lying, please prop pillows behind calves to float heels. Please keep of buttocks as much as possible. Needs Hospital bed/mattress- Run insurance--Dr [name] will try and contact Primary PCP first Wound Treatment [name], [name] ([id number]) [id number] Page 3 of 9 Wound #2 - Lower Leg Wound  Laterality: Right, Anterior Cleanser: Soap and Water 1 x Per Week/30 Days Discharge Instructions: May shower and wash wound with dial antibacterial soap and water prior to dressing change. Cleanser: Vashe 5.8 (oz) (Generic) 1 x Per Week/30 Days Discharge Instructions: or Cleanse the wound with Vashe prior to applying a clean dressing using gauze sponges, not tissue or cotton balls. Cleanser: Wound Cleanser (Generic) 1 x Per Week/30 Days Discharge Instructions: or Cleanse the wound with wound cleanser prior to applying a clean dressing using gauze sponges, not tissue or cotton balls. Prim Dressing: Maxorb Extra Ag+ Alginate Dressing, 4x4.75 (in/in) (Generic) 1 x Per Week/30 Days ary Discharge Instructions: Apply to wound bed as instructed Secondary Dressing: ABD Pad, 8x10 (Generic) 1 x Per Week/30 Days Discharge Instructions: Apply over primary dressing as directed. Secondary Dressing: CarboFLEX Odor Control Dressing, 4x4 in 1 x Per Week/30 Days Discharge Instructions: Apply over primary dressing as directed. Secondary Dressing: Woven Gauze Sponge, Non-Sterile 4x4 in (Generic) 1 x Per Week/30 Days Discharge Instructions: Apply over primary dressing as directed. Secured With: Paper Tape, 2x10 (in/yd) 1 x Per Week/30 Days Discharge Instructions: Secure dressing with tape as directed. Compression Wrap: FourPress (4 layer compression wrap) 1 x Per Week/30 Days Discharge Instructions: Apply four layer compression as directed. May also use Urgo K2 compression system as alternative. Compression Wrap: Stockinette 1 x Per Week/30 Days Wound #3 - Lower Leg Wound Laterality: Right, Lateral Cleanser: Soap and Water 1 x Per Week/30 Days Discharge Instructions: May shower and wash wound with dial antibacterial soap and water prior to dressing change. Cleanser: Vashe 5.8 (oz) (Generic) 1 x Per Week/30 Days Discharge Instructions: or Cleanse the wound with Vashe prior to applying a clean dressing using gauze  sponges, not tissue or cotton balls. Cleanser: Wound Cleanser (Generic) 1 x Per Week/30 Days Discharge Instructions: or Cleanse the wound with wound cleanser prior to applying a clean dressing using gauze sponges, not tissue or cotton balls. Prim Dressing: Maxorb Extra Ag+ Alginate Dressing, 4x4.75 (in/in) (Generic) 1 x Per Week/30 Days ary Discharge Instructions: Apply to wound bed as instructed Secondary Dressing: ABD  Pad, 8x10 (Generic) 1 x Per Week/30 Days Discharge Instructions: Apply over primary dressing as directed. Secondary Dressing: CarboFLEX Odor Control Dressing, 4x4 in 1 x Per Week/30 Days Discharge Instructions: Apply over primary dressing as directed. Secondary Dressing: Woven Gauze Sponge, Non-Sterile 4x4 in (Generic) 1 x Per Week/30 Days Discharge Instructions: Apply over primary dressing as directed. Secured With: Paper Tape, 2x10 (in/yd) 1 x Per Week/30 Days Discharge Instructions: Secure dressing with tape as directed. Compression Wrap: FourPress (4 layer compression wrap) 1 x Per Week/30 Days Discharge Instructions: Apply four layer compression as directed. May also use Urgo K2 compression system as alternative. Compression Wrap: Stockinette 1 x Per Week/30 Days Electronic Signature(s) Signed: 10/15/2022 4:37:01 PM By: [name] Corwin DO Entered By: [name] on 10/15/2022 15:14:56 Problem List Details -------------------------------------------------------------------------------- [name] ([id number]) [id number] Page 4 of 9 Patient Name: Date of Service: [name], [name] 10/15/2022 2:00 PM Medical Record Number: [id number] Patient Account Number: [id number] Date of Birth/Sex: Treating RN: 1950-10-31 (72 y.o. [name] Primary Care Provider: Everrett [name] Other Clinician: Referring Provider: Treating Provider/Extender: [name] in Treatment: 1 Active Problems ICD-10 Encounter Code  Description Active Date MDM Diagnosis [phone] Pressure ulcer of right buttock, stage 2 10/04/2022 No Yes L89.322 Pressure ulcer of left buttock, stage 2 10/04/2022 No Yes L97.818 Non-pressure chronic ulcer of other part of right lower leg with other specified 10/04/2022 No Yes severity I89.0 Lymphedema, not elsewhere classified 10/04/2022 No Yes B37.0 Candidal stomatitis 10/04/2022 No Yes 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, not elsewhere classified 10/04/2022 No Yes Inactive Problems Resolved Problems Electronic Signature(s) Signed: 10/15/2022 4:37:01 PM By: [name] Corwin DO Entered By: [name] on 10/15/2022 15:12:55 -------------------------------------------------------------------------------- Progress Note Details Patient Name: Date of Service: [name] 10/15/2022 2:00 PM Medical Record Number: [id number] Patient Account Number: [id number] Date of Birth/Sex: Treating RN: 1950/10/20 (72 y.o. M) Primary Care Provider: Everrett [name] Other Clinician: Referring Provider: Treating Provider/Extender: [name] in Treatment: 1 Subjective Chief Complaint Information obtained from Patient 10/04/2022; patient is here for review of multiple wounds History of Present Illness (HPI) ADMISSION 10/04/2022 This is a 72 year old man who is quite disabled because of some form of myopathy. He follows for this with neurology. He has had biopsies that showed necrosis without inflammation. At various times this is being labeled as polymyositis, inclusion body myositis although I do not know that an exact label has [place], Garin ([id number]) [phone].pdf Page 5 of 9 been determined. In any case he is reasonably immobile at home. He has this apparatus to help him transfer. Because of difficulties getting him upstairs he has been sleeping in a recliner on the main floor. He also has a history of lymphedema and I see he was followed by PT  for a period of time. He has a history of combined congestive heart failure but his last echo showed an EF of 60 to 65%. He has developed a wound on the right lateral leg over the last month or so. He also has a smaller wound on the right anterior lower leg. He is having problems with his left second toe and he has bilateral stage II ulcers over his ischial tuberosity. The patient is complaining of oral pain under his tongue. He has bilateral heel pain and increasing edema with weight gain over the past week or 2 Past medical history includes myopathy/myositis question inclusion body myositis, rheumatoid arthritis, coronary artery disease, massive pulmonary embolism, type 2 diabetes, lymphedema, combined congestive  heart failure, bilateral foot drop PTSD ABIs in our clinic were 1.22 on the right 1.27 on the left 6/17; patient presents for follow-up. He has been using silver alginate to the bilateral buttocks wounds. These have healed. We have also been using silver alginate to the right lower extremity under compression therapy. He is tolerated this well. The wounds are smaller. Patient History Information obtained from Patient, Chart. Social History Never smoker, Marital Status - Married, Alcohol Use - Never, Drug Use - No History, Caffeine Use - Daily - coffee. Medical History Hematologic/Lymphatic Patient has history of Anemia, Lymphedema - Bilateral Lower legs Cardiovascular Patient has history of Congestive Heart Failure, Deep Vein Thrombosis Endocrine Patient has history of Type II Diabetes Musculoskeletal Patient has history of Rheumatoid Arthritis Hospitalization/Surgery History - Biopsy left Shoulder; Angiogram Pulmonary Bilateral. Medical A Surgical History Notes nd Respiratory Hx: Pulmonary Embolism Gastrointestinal GERD Endocrine Hx: Hypothyroidism Genitourinary ZO:XWRUEAVWUJWJ Immunological XB:JYNWGNFAOZ disease Musculoskeletal Hx: Necrotizing  Myopathy Psychiatric PTSD Objective Constitutional respirations regular, non-labored and within target range for patient.. Vitals Time Taken: 2:30 PM, Height: 68 in, Weight: 220 lbs, BMI: 33.4, Temperature: 98.7 F, Pulse: 78 bpm, Respiratory Rate: 18 breaths/min, Blood Pressure: 123/62 mmHg, Capillary Blood Glucose: 167 mg/dl. Cardiovascular 2+ dorsalis pedis/posterior tibialis pulses. Psychiatric pleasant and cooperative. General Notes: T the buttocks bilaterally there is epithelization to the previous wound site Right lower extremity: T the anterior and lateral aspect there are 2 o o open wounds with granulation tissue throughout. Good edema control. Integumentary (Hair, Skin) Wound #2 status is Open. Original cause of wound was Gradually Appeared. The date acquired was: 09/02/2022. The wound has been in treatment 1 weeks. The wound is located on the Right,Anterior Lower Leg. The wound measures 0.2cm length x 0.2cm width x 0.1cm depth; 0.031cm^2 area and 0.003cm^3 volume. There is Fat Layer (Subcutaneous Tissue) exposed. There is no tunneling or undermining noted. There is a medium amount of serous drainage noted. The wound margin is distinct with the outline attached to the wound base. There is large (67-100%) red granulation within the wound bed. There is a small (1-33%) amount of necrotic tissue within the wound bed including Adherent Slough. The periwound skin appearance had no abnormalities noted for texture. The periwound skin appearance had no abnormalities noted for moisture. The periwound skin appearance had no abnormalities noted for color. Periwound temperature was noted as No Abnormality. Wound #3 status is Open. Original cause of wound was Gradually Appeared. The date acquired was: 09/02/2022. The wound has been in treatment 1 weeks. The [place], [name] ([id number]) [id number] Page 6 of 9 wound is located on the Right,Lateral Lower Leg. The wound measures  2.4cm length x 3.2cm width x 0.1cm depth; 6.032cm^2 area and 0.603cm^3 volume. There is no tunneling or undermining noted. There is a medium amount of serosanguineous drainage noted. There is large (67-100%) red granulation within the wound bed. There is no necrotic tissue within the wound bed. The periwound skin appearance had no abnormalities noted for texture. The periwound skin appearance had no abnormalities noted for moisture. The periwound skin appearance had no abnormalities noted for color. Periwound temperature was noted as No Abnormality. Wound #4 status is Healed - Epithelialized. Original cause of wound was Gradually Appeared. The date acquired was: 09/02/2022. The wound has been in treatment 1 weeks. The wound is located on the Right Gluteus. The wound measures 0cm length x 0cm width x 0cm depth; 0cm^2 area and 0cm^3 volume. There is Fat Layer (Subcutaneous Tissue) exposed. There is  no tunneling or undermining noted. There is a medium amount of serosanguineous drainage noted. There is no granulation within the wound bed. There is no necrotic tissue within the wound bed. The periwound skin appearance had no abnormalities noted for texture. The periwound skin appearance had no abnormalities noted for moisture. The periwound skin appearance had no abnormalities noted for color. Periwound temperature was noted as No Abnormality. Wound #5 status is Healed - Epithelialized. Original cause of wound was Gradually Appeared. The date acquired was: 09/02/2022. The wound has been in treatment 1 weeks. The wound is located on the Left Gluteus. The wound measures 0cm length x 0cm width x 0cm depth; 0cm^2 area and 0cm^3 volume. There is Fat Layer (Subcutaneous Tissue) exposed. There is no tunneling or undermining noted. There is a medium amount of serosanguineous drainage noted. The wound margin is distinct with the outline attached to the wound base. There is no granulation within the wound bed. There is  no necrotic tissue within the wound bed. The periwound skin appearance had no abnormalities noted for texture. The periwound skin appearance had no abnormalities noted for moisture. The periwound skin appearance had no abnormalities noted for color. Periwound temperature was noted as No Abnormality. Assessment Active Problems ICD-10 Pressure ulcer of right buttock, stage 2 Pressure ulcer of left buttock, stage 2 Non-pressure chronic ulcer of other part of right lower leg with other specified severity Lymphedema, not elsewhere classified Candidal stomatitis Other inflammatory and immune myopathies, not elsewhere classified Patient's bilateral buttocks wounds have healed. I recommended continuing to aggressively offload this area and to inspect it daily to assure no open wounds emerge. The right lower extremity wounds have improved in size in appearance since last clinic visit. I recommended continue the course with silver alginate and under 4-layer compression here. Follow-up in 1 week. Procedures Wound #2 Pre-procedure diagnosis of Wound #2 is a Lymphedema located on the Right,Anterior Lower Leg . There was a Four Layer Compression Therapy Procedure by Brenton Grills, RN. Post procedure Diagnosis Wound #2: Same as Pre-Procedure Notes: Scribed for Dr Mikey Bussing by Brenton Grills RN.Marland Kitchen Wound #3 Pre-procedure diagnosis of Wound #3 is a Lymphedema located on the Right,Lateral Lower Leg . There was a Four Layer Compression Therapy Procedure by Brenton Grills, RN. Post procedure Diagnosis Wound #3: Same as Pre-Procedure Notes: Scribed for Dr Mikey Bussing by Brenton Grills RN.Marland Kitchen Plan Follow-up Appointments: Return Appointment in 1 week. - +++ Extra time Room 5/Hoyer ++++Dr. Bren Steers/Dr. Leanord [name] Anesthetic: (In clinic) Topical Lidocaine 4% applied to wound bed - Used in Clinic Bathing/ Shower/ Hygiene: May shower and wash wound with soap and water. - Please do not get right leg wet with compression wraps.  Please keep wrap on all week. Can use Cast Protector-purchase at [employer] etc. Edema Control - Lymphedema / SCD / Other: Elevate legs to the level of the heart or above for 30 minutes daily and/or when sitting for 3-4 times a day throughout the day. Avoid standing for long periods of time. Exercise regularly Moisturize legs daily. Other Edema Control Orders/Instructions: - Wear own compression garments on left leg . T off at night ake Off-Loading: Other: - When sitting or lying, please prop pillows behind calves to float heels. Please keep of buttocks as much as possible. Needs Hospital bed/mattress- Run insurance--Dr [name] will try and contact Primary PCP first WOUND #2: - Lower Leg Wound Laterality: Right, Anterior Cleanser: Soap and Water 1 x Per Week/30 Days Discharge Instructions: May shower and wash wound with dial  antibacterial soap and water prior to dressing change. Cleanser: Vashe 5.8 (oz) (Generic) 1 x Per Week/30 Days Discharge Instructions: or Cleanse the wound with Vashe prior to applying a clean dressing using gauze sponges, not tissue or cotton balls. Cleanser: Wound Cleanser (Generic) 1 x Per Week/30 Days Discharge Instructions: or Cleanse the wound with wound cleanser prior to applying a clean dressing using gauze sponges, not tissue or cotton [name], [name] ([id number]) [id number] Page 7 of 9 balls. Prim Dressing: Maxorb Extra Ag+ Alginate Dressing, 4x4.75 (in/in) (Generic) 1 x Per Week/30 Days ary Discharge Instructions: Apply to wound bed as instructed Secondary Dressing: ABD Pad, 8x10 (Generic) 1 x Per Week/30 Days Discharge Instructions: Apply over primary dressing as directed. Secondary Dressing: CarboFLEX Odor Control Dressing, 4x4 in 1 x Per Week/30 Days Discharge Instructions: Apply over primary dressing as directed. Secondary Dressing: Woven Gauze Sponge, Non-Sterile 4x4 in (Generic) 1 x Per Week/30 Days Discharge Instructions:  Apply over primary dressing as directed. Secured With: Paper T ape, 2x10 (in/yd) 1 x Per Week/30 Days Discharge Instructions: Secure dressing with tape as directed. Com pression Wrap: FourPress (4 layer compression wrap) 1 x Per Week/30 Days Discharge Instructions: Apply four layer compression as directed. May also use Urgo K2 compression system as alternative. Com pression Wrap: Stockinette 1 x Per Week/30 Days WOUND #3: - Lower Leg Wound Laterality: Right, Lateral Cleanser: Soap and Water 1 x Per Week/30 Days Discharge Instructions: May shower and wash wound with dial antibacterial soap and water prior to dressing change. Cleanser: Vashe 5.8 (oz) (Generic) 1 x Per Week/30 Days Discharge Instructions: or Cleanse the wound with Vashe prior to applying a clean dressing using gauze sponges, not tissue or cotton balls. Cleanser: Wound Cleanser (Generic) 1 x Per Week/30 Days Discharge Instructions: or Cleanse the wound with wound cleanser prior to applying a clean dressing using gauze sponges, not tissue or cotton balls. Prim Dressing: Maxorb Extra Ag+ Alginate Dressing, 4x4.75 (in/in) (Generic) 1 x Per Week/30 Days ary Discharge Instructions: Apply to wound bed as instructed Secondary Dressing: ABD Pad, 8x10 (Generic) 1 x Per Week/30 Days Discharge Instructions: Apply over primary dressing as directed. Secondary Dressing: CarboFLEX Odor Control Dressing, 4x4 in 1 x Per Week/30 Days Discharge Instructions: Apply over primary dressing as directed. Secondary Dressing: Woven Gauze Sponge, Non-Sterile 4x4 in (Generic) 1 x Per Week/30 Days Discharge Instructions: Apply over primary dressing as directed. Secured With: Paper T ape, 2x10 (in/yd) 1 x Per Week/30 Days Discharge Instructions: Secure dressing with tape as directed. Com pression Wrap: FourPress (4 layer compression wrap) 1 x Per Week/30 Days Discharge Instructions: Apply four layer compression as directed. May also use Urgo K2 compression  system as alternative. Com pression Wrap: Stockinette 1 x Per Week/30 Days 1. Silver alginate under 4-layer compression to the right lower extremity 2. Follow-up in 1 week 3. Continue aggressive offloading to the buttocks Electronic Signature(s) Signed: 10/15/2022 4:37:01 PM By: [name] Corwin DO Entered By: [name] on 10/15/2022 15:16:20 -------------------------------------------------------------------------------- HxROS Details Patient Name: Date of Service: [name] 10/15/2022 2:00 PM Medical Record Number: [id number] Patient Account Number: [id number] Date of Birth/Sex: Treating RN: June 03, 1950 (72 y.o. M) Primary Care Provider: Everrett [name] Other Clinician: Referring Provider: Treating Provider/Extender: [name] in Treatment: 1 Information Obtained From Patient Chart Hematologic/Lymphatic Medical History: Positive for: Anemia; Lymphedema - Bilateral Lower legs Respiratory Medical History: Past Medical History Notes: Hx: Pulmonary Embolism Cardiovascular Medical History: Positive for: Congestive Heart Failure; Deep Vein Thrombosis [name], [name] (  [id number]) [id number] Page 8 of 9 Gastrointestinal Medical History: Past Medical History Notes: GERD Endocrine Medical History: Positive for: Type II Diabetes Past Medical History Notes: Hx: Hypothyroidism Time with diabetes: 33 years Treated with: Insulin Genitourinary Medical History: Past Medical History Notes: [id number] Immunological Medical History: Past Medical History Notes: DG:[id number] disease Musculoskeletal Medical History: Positive for: Rheumatoid Arthritis Past Medical History Notes: Hx: Necrotizing Myopathy Psychiatric Medical History: Past Medical History Notes: PTSD Immunizations Pneumococcal Vaccine: Received Pneumococcal Vaccination: Yes Received Pneumococcal Vaccination On or After 60th Birthday: No Implantable  Devices None Hospitalization / Surgery History Type of Hospitalization/Surgery Biopsy left Shoulder; Angiogram Pulmonary Bilateral Family and Social History Never smoker; Marital Status - Married; Alcohol Use: Never; Drug Use: No History; Caffeine Use: Daily - coffee; Financial Concerns: No; Food, Clothing or Shelter Needs: No; Support System Lacking: No; Transportation Concerns: No Electronic Signature(s) Signed: 10/15/2022 4:37:01 PM By: [name] Corwin DO Entered By: [name] on 10/15/2022 15:14:04 -------------------------------------------------------------------------------- SuperBill Details Patient Name: Date of Service: [name] 10/15/2022 Medical Record Number: [id number] Patient Account Number: [id number] Date of Birth/Sex: Treating RN: August 10, 1950 (72 y.o. M) Primary Care Provider: Everrett [name] Other Clinician: Referring Provider: Treating Provider/Extender: Sumner Boast [place], [place] ([id number]) [id number] Page 9 of 9 Weeks in Treatment: 1 Diagnosis Coding ICD-10 Codes Code Description L89.312 Pressure ulcer of right buttock, stage 2 L89.322 Pressure ulcer of left buttock, stage 2 L97.818 Non-pressure chronic ulcer of other part of right lower leg with other specified severity I89.0 Lymphedema, not elsewhere classified B37.0 Candidal stomatitis 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, not elsewhere classified Facility Procedures : CPT4 Code: [id number] Description: (Facility Use Only) [phone] - APPLY MULTLAY COMPRS LWR RT LEG ICD-10 Diagnosis Description L97.818 Non-pressure chronic ulcer of other part of right lower leg with other specified sever I89.0 Lymphedema, not elsewhere classified Modifier: ity Quantity: 1 Physician Procedures : CPT4 Code Description Modifier [id number] - WC PHYS LEVEL 3 - EST PT ICD-10 Diagnosis Description L89.312 Pressure ulcer of right buttock, stage 2 L89.322 Pressure  ulcer of left buttock, stage 2 L97.818 Non-pressure chronic ulcer of other part of  right lower leg with other specified severity I89.0 Lymphedema, not elsewhere classified Quantity: 1 Electronic Signature(s) Signed: 10/29/2022 11:21:53 AM By: Pearletha Alfred Signed: 10/29/2022 1:38:23 PM By: [name] Corwin DO Previous Signature: 10/15/2022 4:37:01 PM Version By: [name] Corwin DO Entered By: Pearletha Alfred on 10/29/2022 11:21:53

## 2022-10-17 ENCOUNTER — Ambulatory Visit: Payer: Medicare Other | Admitting: Family Medicine

## 2022-10-17 ENCOUNTER — Ambulatory Visit: Payer: Medicare Other | Admitting: Physical Therapy

## 2022-10-18 ENCOUNTER — Encounter (HOSPITAL_BASED_OUTPATIENT_CLINIC_OR_DEPARTMENT_OTHER): Payer: Medicare Other | Admitting: General Surgery

## 2022-10-18 ENCOUNTER — Ambulatory Visit: Payer: Medicare Other | Admitting: Family Medicine

## 2022-10-19 NOTE — Progress Notes (Signed)
GREGORIO, WORLEY ([id number]) [id number] Page 1 of 12 Visit Report for 10/15/2022 Arrival Information Details Patient Name: Date of Service: [name], [name] 10/15/2022 2:00 PM Medical Record Number: [id number] Patient Account Number: [id number] Date of Birth/Sex: Treating RN: [birth year] (72 y.o. M) Primary Care Modesto Ganoe: Everrett Coombe Other Clinician: Referring Marykathleen Russi: Treating Ladaija [name]/Extender: Cheri Guppy in Treatment: 1 Visit Information History Since Last Visit Added or deleted any medications: No Patient Arrived: Wheel Chair Any new allergies or adverse reactions: No Arrival Time: 14:28 Had a fall or experienced change in No Accompanied By: wife activities of daily living that may affect Transfer Assistance: Michiel Sites Lift risk of falls: Patient Has Alerts: Yes Signs or symptoms of abuse/neglect since last visito No Patient Alerts: ELIQUIS Hospitalized since last visit: No Implantable device outside of the clinic excluding No cellular tissue based products placed in the center since last visit: Has Dressing in Place as Prescribed: Yes Pain Present Now: Yes Electronic Signature(s) Signed: 10/15/2022 4:27:49 PM By: Thayer Dallas Entered By: Thayer Dallas on 10/15/2022 14:30:37 -------------------------------------------------------------------------------- Compression Therapy Details Patient Name: Date of Service: [name], [name] 10/15/2022 2:00 PM Medical Record Number: [id number] Patient Account Number: [id number] Date of Birth/Sex: Treating RN: June 19, 1950 (72 y.o. [name] Primary Care Xadrian Craighead: Everrett Coombe Other Clinician: Referring Vashon Riordan: Treating Tanelle Lanzo/Extender: Cheri Guppy in Treatment: 1 Compression Therapy Performed for Wound Assessment: Wound #2 Right,Anterior Lower Leg Performed By: Clinician Brenton Grills, RN Compression Type: Four Layer Post Procedure Diagnosis Same as  Pre-procedure Notes Scribed for Dr Mikey Bussing by Brenton Grills RN. Electronic Signature(s) Signed: 10/19/2022 10:19:19 AM By: Brenton Grills Entered By: Brenton Grills on 10/15/2022 15:06:54 [name] ([id number]) [id number] Page 2 of 12 -------------------------------------------------------------------------------- Compression Therapy Details Patient Name: Date of Service: [name], [name] 10/15/2022 2:00 PM Medical Record Number: [id number] Patient Account Number: [id number] Date of Birth/Sex: Treating RN: June 18, 1950 (72 y.o. [name] Primary Care Tyshon Fanning: Everrett Coombe Other Clinician: Referring Tashima Scarpulla: Treating Rojelio Uhrich/Extender: Cheri Guppy in Treatment: 1 Compression Therapy Performed for Wound Assessment: Wound #3 Right,Lateral Lower Leg Performed By: Clinician Brenton Grills, RN Compression Type: Four Layer Post Procedure Diagnosis Same as Pre-procedure Notes Scribed for Dr Mikey Bussing by Brenton Grills RN. Electronic Signature(s) Signed: 10/19/2022 10:19:19 AM By: Brenton Grills Entered By: Brenton Grills on 10/15/2022 15:06:54 -------------------------------------------------------------------------------- Encounter Discharge Information Details Patient Name: Date of Service: [name], PYON 10/15/2022 2:00 PM Medical Record Number: [id number] Patient Account Number: [id number] Date of Birth/Sex: Treating RN: 04-Jul-1950 (72 y.o. [name] Primary Care Takera Rayl: Everrett Coombe Other Clinician: Referring Fitzroy Mikami: Treating Shaiann Mcmanamon/Extender: Cheri Guppy in Treatment: 1 Encounter Discharge Information Items Discharge Condition: Stable Ambulatory Status: Wheelchair Discharge Destination: Home Transportation: Private Auto Accompanied By: wife Schedule Follow-up Appointment: Yes Clinical Summary of Care: Patient Declined Electronic Signature(s) Signed: 10/19/2022 10:19:19 AM By:  Brenton Grills Entered By: Brenton Grills on 10/15/2022 15:37:15 -------------------------------------------------------------------------------- Lower Extremity Assessment Details Patient Name: Date of Service: [name], [name] 10/15/2022 2:00 PM Medical Record Number: [id number] Patient Account Number: [id number] Date of Birth/Sex: Treating RN: [birth year] (72 y.o. M) Primary Care Analeigha Nauman: Everrett Coombe Other Clinician: Referring Karriem Muench: Treating Karema Tocci/Extender: Sumner Boast [place], [place] ([id number]) [id number] Page 3 of 12 Weeks in Treatment: 1 Edema Assessment Assessed: [Left: No] [Right: No] Edema: [Left: Ye] [Right: s] Calf Left: Right: Point of Measurement: 37 cm From Medial Instep 45 cm Ankle Left: Right: Point of Measurement: 10 cm From Medial Instep 26.5 cm Electronic Signature(s) Signed:  10/15/2022 4:27:49 PM By: Thayer Dallas Entered By: Thayer Dallas on 10/15/2022 14:42:46 -------------------------------------------------------------------------------- Multi Wound Chart Details Patient Name: Date of Service: [name], [name] 10/15/2022 2:00 PM Medical Record Number: [id number] Patient Account Number: [id number] Date of Birth/Sex: Treating RN: December 16, 1950 (71 y.o. M) Primary Care Lylla Eifler: Everrett Coombe Other Clinician: Referring Liany Mumpower: Treating Terris Bodin/Extender: Cheri Guppy in Treatment: 1 Vital Signs Height(in): 68 Capillary Blood Glucose(mg/dl): 732 Weight(lbs): 202 Pulse(bpm): 78 Body Mass Index(BMI): 33.4 Blood Pressure(mmHg): 123/62 Temperature(F): 98.7 Respiratory Rate(breaths/min): 18 [2:Photos:] Right, Anterior Lower Leg Right, Lateral Lower Leg Right Gluteus Wound Location: Gradually Appeared Gradually Appeared Gradually Appeared Wounding Event: Lymphedema Lymphedema Pressure Ulcer Primary Etiology: Anemia, Lymphedema, Congestive Anemia, Lymphedema, Congestive Anemia,  Lymphedema, Congestive Comorbid History: Heart Failure, Deep Vein Thrombosis, Heart Failure, Deep Vein Thrombosis, Heart Failure, Deep Vein Thrombosis, Type II Diabetes, Rheumatoid Arthritis Type II Diabetes, Rheumatoid Arthritis Type II Diabetes, Rheumatoid Arthritis 09/02/2022 09/02/2022 09/02/2022 Date Acquired: 1 1 1  Weeks of Treatment: Open Open Healed - Epithelialized Wound Status: No No No Wound Recurrence: 0.2x0.2x0.1 2.4x3.2x0.1 0x0x0 Measurements L x W x D (cm) 0.031 6.032 0 A (cm) : rea 0.003 0.603 0 Volume (cm) : 97.40% 41.80% 100.00% % Reduction in Area: 97.50% 41.90% 100.00% % Reduction in Volume: Full Thickness Without Exposed Full Thickness Without Exposed Category/Stage II Classification: Support Structures Support Structures Medium Medium Medium Exudate Amount: Serous Serosanguineous Serosanguineous Exudate Type: amber [name], brown [name], brown Exudate Color: Distinct, outline attached N/A N/A Wound Margin: Pawelski, [name] ([id number]) [id number] Page 4 of 12 Large (67-100%) Large (67-100%) None Present (0%) Granulation Amount: [name] [name] N/A Granulation Quality: Small (1-33%) None Present (0%) None Present (0%) Necrotic Amount: Fat Layer (Subcutaneous Tissue): Yes Fascia: No Fat Layer (Subcutaneous Tissue): Yes Exposed Structures: Fat Layer (Subcutaneous Tissue): No Fascia: No Tendon: No Tendon: No Muscle: No Muscle: No Joint: No Joint: No Bone: No Bone: No Large (67-100%) Medium (34-66%) Large (67-100%) Epithelialization: Excoriation: No No Abnormalities Noted No Abnormalities Noted Periwound Skin Texture: Induration: No Callus: No Crepitus: No Rash: No Scarring: No Maceration: No No Abnormalities Noted No Abnormalities Noted Periwound Skin Moisture: Dry/Scaly: No Atrophie Blanche: No No Abnormalities Noted No Abnormalities Noted Periwound Skin Color: Cyanosis: No Ecchymosis: No Erythema: No Hemosiderin Staining:  No Mottled: No Pallor: No Rubor: No No Abnormality No Abnormality No Abnormality Temperature: Compression Therapy Compression Therapy N/A Procedures Performed: Wound Number: 5 N/A N/A Photos: No Photos N/A N/A Left Gluteus N/A N/A Wound Location: Gradually Appeared N/A N/A Wounding Event: Pressure Ulcer N/A N/A Primary Etiology: Anemia, Lymphedema, Congestive N/A N/A Comorbid History: Heart Failure, Deep Vein Thrombosis, Type II Diabetes, Rheumatoid Arthritis 09/02/2022 N/A N/A Date Acquired: 1 N/A N/A Weeks of Treatment: Healed - Epithelialized N/A N/A Wound Status: No N/A N/A Wound Recurrence: 0x0x0 N/A N/A Measurements L x W x D (cm) 0 N/A N/A A (cm) : rea 0 N/A N/A Volume (cm) : 100.00% N/A N/A % Reduction in A rea: 100.00% N/A N/A % Reduction in Volume: Category/Stage II N/A N/A Classification: Medium N/A N/A Exudate A mount: Serosanguineous N/A N/A Exudate Type: [name], brown N/A N/A Exudate Color: Distinct, outline attached N/A N/A Wound Margin: None Present (0%) N/A N/A Granulation A mount: N/A N/A N/A Granulation Quality: None Present (0%) N/A N/A Necrotic A mount: Fat Layer (Subcutaneous Tissue): Yes N/A N/A Exposed Structures: Fascia: No Tendon: No Muscle: No Joint: No Bone: No Large (67-100%) N/A N/A Epithelialization: No Abnormalities Noted N/A N/A Periwound Skin Texture: No Abnormalities Noted  N/A N/A Periwound Skin Moisture: No Abnormalities Noted N/A N/A Periwound Skin Color: No Abnormality N/A N/A Temperature: N/A N/A N/A Procedures Performed: Treatment Notes Electronic Signature(s) Signed: 10/15/2022 4:37:01 PM By: Geralyn Corwin DO Entered By: Geralyn Corwin on 10/15/2022 15:13:05 Multi-Disciplinary Care Plan Details -------------------------------------------------------------------------------- [name] ([id number]) [id number] Page 5 of 12 Patient Name: Date of Service: [name], [name]  10/15/2022 2:00 PM Medical Record Number: [id number] Patient Account Number: [id number] Date of Birth/Sex: Treating RN: 1950-07-15 (72 y.o. [name] Primary Care Hazley Dezeeuw: Everrett Coombe Other Clinician: Referring Ka Flammer: Treating Naama Sappington/Extender: Cheri Guppy in Treatment: 1 Active Inactive Wound/Skin Impairment Nursing Diagnoses: Impaired tissue integrity Goals: Patient/caregiver will verbalize understanding of skin care regimen Date Initiated: 10/04/2022 Target Resolution Date: 12/29/2022 Goal Status: Active Interventions: Assess ulceration(s) every visit Treatment Activities: Skin care regimen initiated : 10/04/2022 Notes: Electronic Signature(s) Signed: 10/19/2022 10:19:19 AM By: Brenton Grills Entered By: Brenton Grills on 10/15/2022 14:56:00 -------------------------------------------------------------------------------- Pain Assessment Details Patient Name: Date of Service: [name], [name] 10/15/2022 2:00 PM Medical Record Number: [id number] Patient Account Number: [id number] Date of Birth/Sex: Treating RN: [birth year] (72 y.o. M) Primary Care Emon Miggins: Everrett Coombe Other Clinician: Referring Braxton Vantrease: Treating Ediel Unangst/Extender: Cheri Guppy in Treatment: 1 Active Problems Location of Pain Severity and Description of Pain Patient Has Paino Yes Site Locations Pain Location: Generalized Pain Rate the pain. Current Pain Level: 3 Pain Management and Medication Current Pain Management: AIJALON, KIRTZ ([id number]) [id number] Page 6 of 12 Electronic Signature(s) Signed: 10/15/2022 4:27:49 PM By: Thayer Dallas Entered By: Thayer Dallas on 10/15/2022 14:31:05 -------------------------------------------------------------------------------- Patient/Caregiver Education Details Patient Name: Date of Service: CORYDON, SCHWEISS 6/17/2024andnbsp2:00 PM Medical Record Number: [id number] Patient  Account Number: [id number] Date of Birth/Gender: Treating RN: Dec 11, 1950 (72 y.o. [name] Primary Care Physician: Everrett Coombe Other Clinician: Referring Physician: Treating Physician/Extender: Cheri Guppy in Treatment: 1 Education Assessment Education Provided To: Patient and Caregiver Education Topics Provided Wound/Skin Impairment: Methods: Explain/Verbal Responses: State content correctly Electronic Signature(s) Signed: 10/19/2022 10:19:19 AM By: Brenton Grills Entered By: Brenton Grills on 10/15/2022 14:56:38 -------------------------------------------------------------------------------- Wound Assessment Details Patient Name: Date of Service: [name], [name] 10/15/2022 2:00 PM Medical Record Number: [id number] Patient Account Number: [id number] Date of Birth/Sex: Treating RN: 01-Jun-1950 (72 y.o. M) Primary Care Sitlali Koerner: Everrett Coombe Other Clinician: Referring Talli Kimmer: Treating Janya Eveland/Extender: Cheri Guppy in Treatment: 1 Wound Status Wound Number: 2 Primary Lymphedema Etiology: Wound Location: Right, Anterior Lower Leg Wound Open Wounding Event: Gradually Appeared Status: Date Acquired: 09/02/2022 Comorbid Anemia, Lymphedema, Congestive Heart Failure, Deep Vein Weeks Of Treatment: 1 History: Thrombosis, Type II Diabetes, Rheumatoid Arthritis Clustered Wound: No Photos Bransfield, Vishnu ([id number]) [id number] Page 7 of 12 Wound Measurements Length: (cm) 0.2 Width: (cm) 0.2 Depth: (cm) 0.1 Area: (cm) 0.031 Volume: (cm) 0.003 % Reduction in Area: 97.4% % Reduction in Volume: 97.5% Epithelialization: Large (67-100%) Tunneling: No Undermining: No Wound Description Classification: Full Thickness Without Exposed Support Structures Wound Margin: Distinct, outline attached Exudate Amount: Medium Exudate Type: Serous Exudate Color: amber Foul Odor After Cleansing:  No Slough/Fibrino Yes Wound Bed Granulation Amount: Large (67-100%) Exposed Structure Granulation Quality: [name] Fat Layer (Subcutaneous Tissue) Exposed: Yes Necrotic Amount: Small (1-33%) Necrotic Quality: Adherent Slough Periwound Skin Texture Texture Color No Abnormalities Noted: Yes No Abnormalities Noted: Yes Moisture Temperature / Pain No Abnormalities Noted: Yes Temperature: No Abnormality Treatment Notes Wound #2 (Lower Leg) Wound Laterality: Right, Anterior Cleanser Soap and Water Discharge Instruction: May shower and wash  wound with dial antibacterial soap and water prior to dressing change. Vashe 5.8 (oz) Discharge Instruction: or Cleanse the wound with Vashe prior to applying a clean dressing using gauze sponges, not tissue or cotton balls. Wound Cleanser Discharge Instruction: or Cleanse the wound with wound cleanser prior to applying a clean dressing using gauze sponges, not tissue or cotton balls. Peri-Wound Care Topical Primary Dressing Maxorb Extra Ag+ Alginate Dressing, 4x4.75 (in/in) Discharge Instruction: Apply to wound bed as instructed Secondary Dressing ABD Pad, 8x10 Discharge Instruction: Apply over primary dressing as directed. CarboFLEX Odor Control Dressing, 4x4 in Discharge Instruction: Apply over primary dressing as directed. Woven Gauze Sponge, Non-Sterile 4x4 in Discharge Instruction: Apply over primary dressing as directed. Secured With Paper Tape, 2x10 (in/yd) Discharge Instruction: Secure dressing with tape as directed. BURNIS, HALLING ([id number]) [id number] Page 8 of 12 Compression Wrap FourPress (4 layer compression wrap) Discharge Instruction: Apply four layer compression as directed. May also use Urgo K2 compression system as alternative. Stockinette Compression Stockings Facilities manager) Signed: 10/15/2022 4:27:49 PM By: Thayer Dallas Entered By: Thayer Dallas on 10/15/2022  14:46:32 -------------------------------------------------------------------------------- Wound Assessment Details Patient Name: Date of Service: [name], [name] 10/15/2022 2:00 PM Medical Record Number: [id number] Patient Account Number: [id number] Date of Birth/Sex: Treating RN: November 14, 1950 (72 y.o. M) Primary Care Mathew Storck: Everrett Coombe Other Clinician: Referring Safi Culotta: Treating Lakynn Halvorsen/Extender: Cheri Guppy in Treatment: 1 Wound Status Wound Number: 3 Primary Lymphedema Etiology: Wound Location: Right, Lateral Lower Leg Wound Open Wounding Event: Gradually Appeared Status: Date Acquired: 09/02/2022 Comorbid Anemia, Lymphedema, Congestive Heart Failure, Deep Vein Weeks Of Treatment: 1 History: Thrombosis, Type II Diabetes, Rheumatoid Arthritis Clustered Wound: No Photos Wound Measurements Length: (cm) 2.4 Width: (cm) 3.2 Depth: (cm) 0.1 Area: (cm) 6.032 Volume: (cm) 0.603 % Reduction in Area: 41.8% % Reduction in Volume: 41.9% Epithelialization: Medium (34-66%) Tunneling: No Undermining: No Wound Description Classification: Full Thickness Without Exposed Suppor Exudate Amount: Medium Exudate Type: Serosanguineous Exudate Color: [name], brown t Structures Foul Odor After Cleansing: No Slough/Fibrino Yes Wound Bed Granulation Amount: Large (67-100%) Exposed Structure Granulation Quality: [name] Fascia Exposed: No Necrotic Amount: None Present (0%) Fat Layer (Subcutaneous Tissue) Exposed: No Tendon Exposed: No Muscle Exposed: No Joint Exposed: No Bone Exposed: No Walthall, Demitrious ([id number]) [id number] Page 9 of 12 Periwound Skin Texture Texture Color No Abnormalities Noted: Yes No Abnormalities Noted: Yes Moisture Temperature / Pain No Abnormalities Noted: Yes Temperature: No Abnormality Treatment Notes Wound #3 (Lower Leg) Wound Laterality: Right, Lateral Cleanser Soap and Water Discharge Instruction: May  shower and wash wound with dial antibacterial soap and water prior to dressing change. Vashe 5.8 (oz) Discharge Instruction: or Cleanse the wound with Vashe prior to applying a clean dressing using gauze sponges, not tissue or cotton balls. Wound Cleanser Discharge Instruction: or Cleanse the wound with wound cleanser prior to applying a clean dressing using gauze sponges, not tissue or cotton balls. Peri-Wound Care Topical Primary Dressing Maxorb Extra Ag+ Alginate Dressing, 4x4.75 (in/in) Discharge Instruction: Apply to wound bed as instructed Secondary Dressing ABD Pad, 8x10 Discharge Instruction: Apply over primary dressing as directed. CarboFLEX Odor Control Dressing, 4x4 in Discharge Instruction: Apply over primary dressing as directed. Woven Gauze Sponge, Non-Sterile 4x4 in Discharge Instruction: Apply over primary dressing as directed. Secured With Paper Tape, 2x10 (in/yd) Discharge Instruction: Secure dressing with tape as directed. Compression Wrap FourPress (4 layer compression wrap) Discharge Instruction: Apply four layer compression as directed. May also use Urgo K2 compression system as alternative. Stockinette Compression  Stockings Facilities manager) Signed: 10/15/2022 4:27:49 PM By: Thayer Dallas Entered By: Thayer Dallas on 10/15/2022 14:47:15 -------------------------------------------------------------------------------- Wound Assessment Details Patient Name: Date of Service: [name], [name] 10/15/2022 2:00 PM Medical Record Number: [id number] Patient Account Number: [id number] Date of Birth/Sex: Treating RN: [birth year] (72 y.o. [name] Primary Care Kalim Kissel: Everrett Coombe Other Clinician: Referring Marguerite Jarboe: Treating Kerstie Agent/Extender: Sumner Boast Weeks in Treatment: 1 Wound Status Wound Number: 4 Primary Pressure Ulcer Etiology: Wound Location: Right Gluteus Wound Healed - Epithelialized Wounding Event:  Gradually Appeared StatusSriyan Cutting, [name] ([id number]) [id number] Page 10 of 12 Status: Date Acquired: 09/02/2022 Comorbid Anemia, Lymphedema, Congestive Heart Failure, Deep Vein Weeks Of Treatment: 1 History: Thrombosis, Type II Diabetes, Rheumatoid Arthritis Clustered Wound: No Photos Wound Measurements Length: (cm) Width: (cm) Depth: (cm) Area: (cm) Volume: (cm) 0 % Reduction in Area: 100% 0 % Reduction in Volume: 100% 0 Epithelialization: Large (67-100%) 0 Tunneling: No 0 Undermining: No Wound Description Classification: Category/Stage II Exudate Amount: Medium Exudate Type: Serosanguineous Exudate Color: [name], brown Foul Odor After Cleansing: No Slough/Fibrino Yes Wound Bed Granulation Amount: None Present (0%) Exposed Structure Necrotic Amount: None Present (0%) Fascia Exposed: No Fat Layer (Subcutaneous Tissue) Exposed: Yes Tendon Exposed: No Muscle Exposed: No Joint Exposed: No Bone Exposed: No Periwound Skin Texture Texture Color No Abnormalities Noted: Yes No Abnormalities Noted: Yes Moisture Temperature / Pain No Abnormalities Noted: Yes Temperature: No Abnormality Treatment Notes Wound #4 (Gluteus) Wound Laterality: Right Cleanser Peri-Wound Care Topical Primary Dressing Secondary Dressing Secured With Compression Wrap Compression Stockings Add-Ons Electronic Signature(s) Signed: 10/19/2022 10:19:19 AM By: Brenton Grills Entered By: Brenton Grills on 10/15/2022 15:11:38 Spickard, [name] ([id number]) [id number] Page 11 of 12 -------------------------------------------------------------------------------- Wound Assessment Details Patient Name: Date of Service: [name], [name] 10/15/2022 2:00 PM Medical Record Number: [id number] Patient Account Number: [id number] Date of Birth/Sex: Treating RN: 07-12-1950 (72 y.o. [name] Primary Care Demetre Monaco: Everrett Coombe Other Clinician: Referring  Kaelani Kendrick: Treating Shaterria Sager/Extender: Cheri Guppy in Treatment: 1 Wound Status Wound Number: 5 Primary Pressure Ulcer Etiology: Wound Location: Left Gluteus Wound Healed - Epithelialized Wounding Event: Gradually Appeared Status: Date Acquired: 09/02/2022 Comorbid Anemia, Lymphedema, Congestive Heart Failure, Deep Vein Weeks Of Treatment: 1 History: Thrombosis, Type II Diabetes, Rheumatoid Arthritis Clustered Wound: No Wound Measurements Length: (cm) Width: (cm) Depth: (cm) Area: (cm) Volume: (cm) 0 % Reduction in Area: 100% 0 % Reduction in Volume: 100% 0 Epithelialization: Large (67-100%) 0 Tunneling: No 0 Undermining: No Wound Description Classification: Category/Stage II Wound Margin: Distinct, outline attached Exudate Amount: Medium Exudate Type: Serosanguineous Exudate Color: [name], brown Foul Odor After Cleansing: No Slough/Fibrino No Wound Bed Granulation Amount: None Present (0%) Exposed Structure Necrotic Amount: None Present (0%) Fascia Exposed: No Fat Layer (Subcutaneous Tissue) Exposed: Yes Tendon Exposed: No Muscle Exposed: No Joint Exposed: No Bone Exposed: No Periwound Skin Texture Texture Color No Abnormalities Noted: Yes No Abnormalities Noted: Yes Moisture Temperature / Pain No Abnormalities Noted: Yes Temperature: No Abnormality Treatment Notes Wound #5 (Gluteus) Wound Laterality: Left Cleanser Peri-Wound Care Topical Primary Dressing Secondary Dressing Secured With Compression Wrap Compression Stockings Add-Ons Electronic Signature(s) Signed: 10/19/2022 10:19:19 AM By: Verlin Grills, Mckinnley ([id number]) [id number] Page 12 of 12 Signed: 10/19/2022 10:19:19 AM By: Brenton Grills Entered By: Brenton Grills on 10/15/2022 15:11:44 -------------------------------------------------------------------------------- Vitals Details Patient Name: Date of Service: [name], [name] 10/15/2022  2:00 PM Medical Record Number: [id number] Patient Account Number: [id number] Date of Birth/Sex: Treating RN: 08-Jan-1951 (72 y.o. M) Primary Care  Dravin Lance: Everrett Coombe Other Clinician: Referring Brock Larmon: Treating Taos Tapp/Extender: Cheri Guppy in Treatment: 1 Vital Signs Time Taken: 14:30 Temperature (F): 98.7 Height (in): 68 Pulse (bpm): 78 Weight (lbs): 220 Respiratory Rate (breaths/min): 18 Body Mass Index (BMI): 33.4 Blood Pressure (mmHg): 123/62 Capillary Blood Glucose (mg/dl): 782 Reference Range: 80 - 120 mg / dl Electronic Signature(s) Signed: 10/15/2022 4:27:49 PM By: Thayer Dallas Entered By: Thayer Dallas on 10/15/2022 14:32:10

## 2022-10-23 ENCOUNTER — Encounter (HOSPITAL_BASED_OUTPATIENT_CLINIC_OR_DEPARTMENT_OTHER): Payer: Medicare Other | Admitting: Internal Medicine

## 2022-10-23 ENCOUNTER — Encounter: Payer: Medicare Other | Admitting: Physical Therapy

## 2022-10-23 DIAGNOSIS — I890 Lymphedema, not elsewhere classified: Secondary | ICD-10-CM | POA: Diagnosis not present

## 2022-10-23 DIAGNOSIS — E11622 Type 2 diabetes mellitus with other skin ulcer: Secondary | ICD-10-CM | POA: Diagnosis not present

## 2022-10-23 DIAGNOSIS — L97818 Non-pressure chronic ulcer of other part of right lower leg with other specified severity: Secondary | ICD-10-CM | POA: Diagnosis not present

## 2022-10-23 DIAGNOSIS — L89312 Pressure ulcer of right buttock, stage 2: Secondary | ICD-10-CM | POA: Diagnosis not present

## 2022-10-23 DIAGNOSIS — L89322 Pressure ulcer of left buttock, stage 2: Secondary | ICD-10-CM | POA: Diagnosis not present

## 2022-10-23 DIAGNOSIS — E039 Hypothyroidism, unspecified: Secondary | ICD-10-CM | POA: Diagnosis not present

## 2022-10-23 NOTE — Progress Notes (Addendum)
[name] ([id number]) [id number] Page 1 of 8 Visit Report for 10/23/2022 Chief Complaint Document Details Patient Name: Date of Service: [name], [name] 10/23/2022 2:45 PM Medical Record Number: [id number] Patient Account Number: [id number] Date of Birth/Sex: Treating RN: 1950-12-03 (72 y.o. [name] Primary Care Provider: Everrett Coombe Other Clinician: Referring Provider: Treating Provider/Extender: Cheri Guppy in Treatment: 2 Information Obtained from: Patient Chief Complaint 10/04/2022; patient is here for review of multiple wounds Electronic Signature(s) Signed: 10/23/2022 4:25:00 PM By: [name] Corwin DO Entered By: [name] on 10/23/2022 15:49:48 -------------------------------------------------------------------------------- HPI Details Patient Name: Date of Service: [name], [name] 10/23/2022 2:45 PM Medical Record Number: [id number] Patient Account Number: [id number] Date of Birth/Sex: Treating RN: [birth year] (72 y.o. [name] Primary Care Provider: Everrett Coombe Other Clinician: Referring Provider: Treating Provider/Extender: Cheri Guppy in Treatment: 2 History of Present Illness HPI Description: ADMISSION 10/04/2022 This is a 72 year old man who is quite disabled because of some form of myopathy. He follows for this with neurology. He has had biopsies that showed necrosis without inflammation. 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 In any case he is reasonably immobile at home. He has this apparatus to help him transfer. Because of difficulties getting him upstairs he has been sleeping in a recliner on the main floor. He also has a history of lymphedema and I see he was followed by PT for a period of time. He has a history of combined congestive heart failure but his  last echo showed an EF of 60 to 65%. He has developed a wound on the right lateral leg over the last month or so. He also has a smaller wound on the right anterior lower leg. He is having problems with his left second toe and he has bilateral stage II ulcers over his ischial tuberosity. The patient is complaining of oral pain under his tongue. He has bilateral heel pain and increasing edema with weight gain over the past week or 2 Past medical history includes myopathy/myositis question inclusion body myositis, rheumatoid arthritis, coronary artery disease, massive pulmonary embolism, type 2 diabetes, lymphedema, combined congestive heart failure, bilateral foot drop PTSD ABIs in our clinic were 1.22 on the right 1.27 on the left 6/17; patient presents for follow-up. He has been using silver alginate to the bilateral buttocks wounds. These have healed. We have also been using silver alginate to the right lower extremity under compression therapy. He is tolerated this well. The wounds are smaller. 6/25; patient presents for follow-up. We have been using silver alginate to the right lower extremity under compression therapy. The anterior leg wound has healed. The lateral right leg wound is smaller. Electronic Signature(s) Signed: 10/23/2022 4:25:00 PM By: [name] Corwin DO Entered By: [name] on 10/23/2022 15:50:13 Valdez, [name] ([id number]) [id number] Page 2 of 8 -------------------------------------------------------------------------------- Physical Exam Details Patient Name: Date of Service: [name], [name] 10/23/2022 2:45 PM Medical Record Number: [id number] Patient Account Number: [id number] Date of Birth/Sex: Treating RN: [birth year] (72 y.o. [name] Primary Care Provider: Everrett Coombe Other Clinician: Referring Provider: Treating Provider/Extender: Cheri Guppy in Treatment: 2 Constitutional respirations regular,  non-labored and within target range for patient.. Cardiovascular 2+ dorsalis pedis/posterior tibialis pulses. Psychiatric pleasant and cooperative. Notes Right lower extremity: T the anterior aspect there is epithelization to the previous wound site. T the lateral aspect there is an  open wound with granulation o o tissue and mostly epithelization. 3+ pitting edema to the left thigh. Electronic Signature(s) Signed: 10/23/2022 4:25:00 PM By: [name] Corwin DO Entered By: [name] on 10/23/2022 15:50:51 -------------------------------------------------------------------------------- Physician Orders Details Patient Name: Date of Service: [name], [name] 10/23/2022 2:45 PM Medical Record Number: [id number] Patient Account Number: [id number] Date of Birth/Sex: Treating RN: 29-Oct-1950 (72 y.o. [name] Primary Care Provider: Everrett Coombe Other Clinician: Referring Provider: Treating Provider/Extender: Cheri Guppy in Treatment: 2 Verbal / Phone Orders: No Diagnosis Coding Follow-up Appointments ppointment in 2 weeks. - +++ Extra time Room 5/Hoyer ++++Dr. Frances Ambrosino/Dr. Leanord Hawking Return A Nurse Visit: Anesthetic (In clinic) Topical Lidocaine 4% applied to wound bed - Used in Clinic Bathing/ Shower/ Hygiene May shower and wash wound with soap and water. - Please do not get right leg wet with compression wraps. Please keep wrap on all week. Can use Cast Protector-purchase at [employer] etc. Edema Control - Lymphedema / SCD / Other Bilateral Lower Extremities Elevate legs to the level of the heart or above for 30 minutes daily and/or when sitting for 3-4 times a day throughout the day. Avoid standing for long periods of time. Exercise regularly Moisturize legs daily. Other Edema Control Orders/Instructions: - Wear own compression garments on left leg . T off at night ake Off-Loading Other: - When sitting or lying, please prop pillows behind calves  to float heels. Please keep of buttocks as much as possible. [name], [name] ([id number]) [id number] Page 3 of 8 Needs Hospital bed/mattress- Run insurance--Dr Leanord Hawking will try and contact Primary PCP first Wound Treatment Wound #3 - Lower Leg Wound Laterality: Right, Lateral Cleanser: Soap and Water 1 x Per Week/30 Days Discharge Instructions: May shower and wash wound with dial antibacterial soap and water prior to dressing change. Cleanser: Vashe 5.8 (oz) (Generic) 1 x Per Week/30 Days Discharge Instructions: or Cleanse the wound with Vashe prior to applying a clean dressing using gauze sponges, not tissue or cotton balls. Cleanser: Wound Cleanser (Generic) 1 x Per Week/30 Days Discharge Instructions: or Cleanse the wound with wound cleanser prior to applying a clean dressing using gauze sponges, not tissue or cotton balls. Prim Dressing: Maxorb Extra Ag+ Alginate Dressing, 4x4.75 (in/in) (Generic) 1 x Per Week/30 Days ary Discharge Instructions: Apply to wound bed as instructed Secondary Dressing: ABD Pad, 8x10 (Generic) 1 x Per Week/30 Days Discharge Instructions: Apply over primary dressing as directed. Secondary Dressing: CarboFLEX Odor Control Dressing, 4x4 in 1 x Per Week/30 Days Discharge Instructions: Apply over primary dressing as directed. Secondary Dressing: Woven Gauze Sponge, Non-Sterile 4x4 in (Generic) 1 x Per Week/30 Days Discharge Instructions: Apply over primary dressing as directed. Secured With: Paper Tape, 2x10 (in/yd) 1 x Per Week/30 Days Discharge Instructions: Secure dressing with tape as directed. Compression Wrap: FourPress (4 layer compression wrap) 1 x Per Week/30 Days Discharge Instructions: Apply four layer compression as directed. May also use Urgo K2 compression system as alternative. Compression Wrap: Stockinette 1 x Per Week/30 Days Electronic Signature(s) Signed: 10/23/2022 4:25:00 PM By: [name] Corwin DO Entered By: [name] on 10/23/2022 15:51:06 -------------------------------------------------------------------------------- Problem List Details Patient Name: Date of Service: [name], [name] 10/23/2022 2:45 PM Medical Record Number: [id number] Patient Account Number: [id number] Date of Birth/Sex: Treating RN: [birth year] (72 y.o. [name] Primary Care Provider: Everrett Coombe Other Clinician: Referring Provider: Treating Provider/Extender: Cheri Guppy in Treatment: 2 Active Problems ICD-10 Encounter Code Description Active Date MDM Diagnosis L89.312 Pressure  ulcer of right buttock, stage 2 10/04/2022 No Yes L89.322 Pressure ulcer of left buttock, stage 2 10/04/2022 No Yes L97.818 Non-pressure chronic ulcer of other part of right lower leg with other specified 10/04/2022 No Yes severity I89.0 Lymphedema, not elsewhere classified 10/04/2022 No Yes Goodloe, Forrest ([id number]) [id number] Page 4 of 8 B37.0 Candidal stomatitis 10/04/2022 No Yes 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, not elsewhere classified 10/04/2022 No Yes Inactive Problems Resolved Problems Electronic Signature(s) Signed: 10/23/2022 4:25:00 PM By: [name] Corwin DO Entered By: [name] on 10/23/2022 15:49:38 -------------------------------------------------------------------------------- Progress Note Details Patient Name: Date of Service: [name] 10/23/2022 2:45 PM Medical Record Number: [id number] Patient Account Number: [id number] Date of Birth/Sex: Treating RN: 09-Mar-1951 (72 y.o. [name] Primary Care Provider: Everrett Coombe Other Clinician: Referring Provider: Treating Provider/Extender: Cheri Guppy in Treatment: 2 Subjective Chief Complaint Information obtained from Patient 10/04/2022; patient is here for review of multiple wounds History of Present Illness (HPI) ADMISSION 10/04/2022 This is a 72 year old man  who is quite disabled because of some form of myopathy. He follows for this with neurology. He has had biopsies that showed necrosis without inflammation. 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 In any case he is reasonably immobile at home. He has this apparatus to help him transfer. Because of difficulties getting him upstairs he has been sleeping in a recliner on the main floor. He also has a history of lymphedema and I see he was followed by PT for a period of time. He has a history of combined congestive heart failure but his last echo showed an EF of 60 to 65%. He has developed a wound on the right lateral leg over the last month or so. He also has a smaller wound on the right anterior lower leg. He is having problems with his left second toe and he has bilateral stage II ulcers over his ischial tuberosity. The patient is complaining of oral pain under his tongue. He has bilateral heel pain and increasing edema with weight gain over the past week or 2 Past medical history includes myopathy/myositis question inclusion body myositis, rheumatoid arthritis, coronary artery disease, massive pulmonary embolism, type 2 diabetes, lymphedema, combined congestive heart failure, bilateral foot drop PTSD ABIs in our clinic were 1.22 on the right 1.27 on the left 6/17; patient presents for follow-up. He has been using silver alginate to the bilateral buttocks wounds. These have healed. We have also been using silver alginate to the right lower extremity under compression therapy. He is tolerated this well. The wounds are smaller. 6/25; patient presents for follow-up. We have been using silver alginate to the right lower extremity under compression therapy. The anterior leg wound has healed. The lateral right leg wound is smaller. Patient History Information obtained from Patient, Chart. Social  History Never smoker, Marital Status - Married, Alcohol Use - Never, Drug Use - No History, Caffeine Use - Daily - coffee. Medical History Hematologic/Lymphatic Patient has history of Anemia, Lymphedema - Bilateral Lower legs Cardiovascular Patient has history of Congestive Heart Failure, Deep Vein Thrombosis Endocrine Jowers, [name] ([id number]) [id number] Page 5 of 8 Patient has history of Type II Diabetes Musculoskeletal Patient has history of Rheumatoid Arthritis Hospitalization/Surgery History - Biopsy left Shoulder; Angiogram Pulmonary Bilateral. Medical A Surgical History Notes nd Respiratory Hx: Pulmonary Embolism Gastrointestinal GERD Endocrine Hx: Hypothyroidism Genitourinary [name]:[id number] Immunological [place]:[name] disease Musculoskeletal Hx: Necrotizing  Myopathy Psychiatric PTSD Objective Constitutional respirations regular, non-labored and within target range for patient.. Vitals Time Taken: 3:08 PM, Height: 68 in, Weight: 220 lbs, BMI: 33.4, Temperature: 98.5 F, Pulse: 91 bpm, Respiratory Rate: 18 breaths/min, Blood Pressure: 111/63 mmHg, Capillary Blood Glucose: 112 mg/dl. Cardiovascular 2+ dorsalis pedis/posterior tibialis pulses. Psychiatric pleasant and cooperative. General Notes: Right lower extremity: T the anterior aspect there is epithelization to the previous wound site. T the lateral aspect there is an open wound with o o granulation tissue and mostly epithelization. 3+ pitting edema to the left thigh. Integumentary (Hair, Skin) Wound #2 status is Healed - Epithelialized. Original cause of wound was Gradually Appeared. The date acquired was: 09/02/2022. The wound has been in treatment 2 weeks. The wound is located on the Right,Anterior Lower Leg. The wound measures 0cm length x 0cm width x 0cm depth; 0cm^2 area and 0cm^3 volume. There is no tunneling or undermining noted. There is a medium amount of serous drainage noted.  The wound margin is distinct with the outline attached to the wound base. There is no granulation within the wound bed. There is no necrotic tissue within the wound bed. The periwound skin appearance had no abnormalities noted for texture. The periwound skin appearance had no abnormalities noted for moisture. The periwound skin appearance had no abnormalities noted for color. Periwound temperature was noted as No Abnormality. Wound #3 status is Open. Original cause of wound was Gradually Appeared. The date acquired was: 09/02/2022. The wound has been in treatment 2 weeks. The wound is located on the Right,Lateral Lower Leg. The wound measures 1cm length x 1cm width x 0.1cm depth; 0.785cm^2 area and 0.079cm^3 volume. There is no tunneling or undermining noted. There is a medium amount of serosanguineous drainage noted. There is large (67-100%) red, pink granulation within the wound bed. There is no necrotic tissue within the wound bed. The periwound skin appearance had no abnormalities noted for texture. The periwound skin appearance had no abnormalities noted for moisture. The periwound skin appearance had no abnormalities noted for color. Periwound temperature was noted as No Abnormality. Assessment Active Problems ICD-10 Pressure ulcer of right buttock, stage 2 Pressure ulcer of left buttock, stage 2 Non-pressure chronic ulcer of other part of right lower leg with other specified severity Lymphedema, not elsewhere classified Candidal stomatitis Other inflammatory and immune myopathies, not elsewhere classified Patient has 1 remaining wound to the right lower extremity that appears well-healing. I recommended continue with silver alginate under 4-layer compression. Unfortunately has a lot of swelling on exam. He is working on a diuretic regiment with his primary doctor. He would benefit from thigh-high compression stockings and we will try to order these. [name], [name] ([id number])  [id number] Page 6 of 8 Procedures Wound #3 Pre-procedure diagnosis of Wound #3 is a Lymphedema located on the Right,Lateral Lower Leg . There was a Four Layer Compression Therapy Procedure by Tommie Ard, RN. Post procedure Diagnosis Wound #3: Same as Pre-Procedure Notes: urgo regular. Plan Follow-up Appointments: Return Appointment in 2 weeks. - +++ Extra time Room 5/Hoyer ++++Dr. Raymondo Band Nurse Visit: Anesthetic: (In clinic) Topical Lidocaine 4% applied to wound bed - Used in Clinic Bathing/ Shower/ Hygiene: May shower and wash wound with soap and water. - Please do not get right leg wet with compression wraps. Please keep wrap on all week. Can use Cast Protector-purchase at [employer] etc. Edema Control - Lymphedema / SCD / Other: Elevate legs to the level of the heart or above for 30 minutes  daily and/or when sitting for 3-4 times a day throughout the day. Avoid standing for long periods of time. Exercise regularly Moisturize legs daily. Other Edema Control Orders/Instructions: - Wear own compression garments on left leg . T off at night ake Off-Loading: Other: - When sitting or lying, please prop pillows behind calves to float heels. Please keep of buttocks as much as possible. Needs Hospital bed/mattress- Run insurance--Dr Leanord Hawking will try and contact Primary PCP first WOUND #3: - Lower Leg Wound Laterality: Right, Lateral Cleanser: Soap and Water 1 x Per Week/30 Days Discharge Instructions: May shower and wash wound with dial antibacterial soap and water prior to dressing change. Cleanser: Vashe 5.8 (oz) (Generic) 1 x Per Week/30 Days Discharge Instructions: or Cleanse the wound with Vashe prior to applying a clean dressing using gauze sponges, not tissue or cotton balls. Cleanser: Wound Cleanser (Generic) 1 x Per Week/30 Days Discharge Instructions: or Cleanse the wound with wound cleanser prior to applying a clean dressing using  gauze sponges, not tissue or cotton balls. Prim Dressing: Maxorb Extra Ag+ Alginate Dressing, 4x4.75 (in/in) (Generic) 1 x Per Week/30 Days ary Discharge Instructions: Apply to wound bed as instructed Secondary Dressing: ABD Pad, 8x10 (Generic) 1 x Per Week/30 Days Discharge Instructions: Apply over primary dressing as directed. Secondary Dressing: CarboFLEX Odor Control Dressing, 4x4 in 1 x Per Week/30 Days Discharge Instructions: Apply over primary dressing as directed. Secondary Dressing: Woven Gauze Sponge, Non-Sterile 4x4 in (Generic) 1 x Per Week/30 Days Discharge Instructions: Apply over primary dressing as directed. Secured With: Paper T ape, 2x10 (in/yd) 1 x Per Week/30 Days Discharge Instructions: Secure dressing with tape as directed. Com pression Wrap: FourPress (4 layer compression wrap) 1 x Per Week/30 Days Discharge Instructions: Apply four layer compression as directed. May also use Urgo K2 compression system as alternative. Com pression Wrap: Stockinette 1 x Per Week/30 Days 1. Silver alginate under 4-layer compression to the right lower extremity 2. Follow-up in 1 week Electronic Signature(s) Signed: 10/23/2022 4:25:00 PM By: [name] Corwin DO Entered By: [name] on 10/23/2022 15:51:54 -------------------------------------------------------------------------------- HxROS Details Patient Name: Date of Service: [name], [name] 10/23/2022 2:45 PM Medical Record Number: [id number] Patient Account Number: [id number] Date of Birth/Sex: Treating RN: 16-Jun-1950 (72 y.o. [name] Primary Care Provider: Everrett Coombe Other Clinician: Referring Provider: Treating Provider/Extender: Cheri Guppy in Treatment: 2 Information Obtained From [name], [name] ([id number]) [id number] Page 7 of 8 Patient Chart Hematologic/Lymphatic Medical History: Positive for: Anemia; Lymphedema - Bilateral Lower  legs Respiratory Medical History: Past Medical History Notes: Hx: Pulmonary Embolism Cardiovascular Medical History: Positive for: Congestive Heart Failure; Deep Vein Thrombosis Gastrointestinal Medical History: Past Medical History Notes: GERD Endocrine Medical History: Positive for: Type II Diabetes Past Medical History Notes: Hx: Hypothyroidism Time with diabetes: 33 years Treated with: Insulin Genitourinary Medical History: Past Medical History Notes: NW:GNFAOZHYQMVH Immunological Medical History: Past Medical History Notes: QI:ONGEXBMWUX disease Musculoskeletal Medical History: Positive for: Rheumatoid Arthritis Past Medical History Notes: Hx: Necrotizing Myopathy Psychiatric Medical History: Past Medical History Notes: PTSD Immunizations Pneumococcal Vaccine: Received Pneumococcal Vaccination: Yes Received Pneumococcal Vaccination On or After 60th Birthday: No Implantable Devices None Hospitalization / Surgery History Type of Hospitalization/Surgery Biopsy left Shoulder; Angiogram Pulmonary Bilateral Family and Social History Never smoker; Marital Status - Married; Alcohol Use: Never; Drug Use: No History; Caffeine Use: Daily - coffee; Financial Concerns: No; Food, Clothing or Shelter Needs: No; Support System Lacking: No; Transportation Concerns: No Electronic Signature(s) Signed: 10/23/2022 4:25:00 PM By:  [name] Corwin DO Signed: 10/23/2022 4:48:05 PM By: Karie Schwalbe RN [name], Jaimes ([id number]) [id number] Page 8 of 8 Signed: 10/23/2022 4:48:05 PM By: Karie Schwalbe RN Entered By: [name] on 10/23/2022 15:50:17 -------------------------------------------------------------------------------- SuperBill Details Patient Name: Date of Service: COTTER, LIGON 10/23/2022 Medical Record Number: [id number] Patient Account Number: [id number] Date of Birth/Sex: Treating RN: 1951/04/14 (72 y.o. [name] Primary  Care Provider: Everrett Coombe Other Clinician: Referring Provider: Treating Provider/Extender: Cheri Guppy in Treatment: 2 Diagnosis Coding ICD-10 Codes Code Description [phone] Pressure ulcer of right buttock, stage 2 L89.322 Pressure ulcer of left buttock, stage 2 L97.818 Non-pressure chronic ulcer of other part of right lower leg with other specified severity I89.0 Lymphedema, not elsewhere classified B37.0 Candidal stomatitis 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, not elsewhere classified Facility Procedures : CPT4 Code: [id number] Description: (Facility Use Only) [phone] - APPLY MULTLAY COMPRS LWR RT LEG ICD-10 Diagnosis Description I89.0 Lymphedema, not elsewhere classified Modifier: Quantity: 1 Physician Procedures : CPT4 Code Description Modifier [id number] - WC PHYS LEVEL 3 - EST PT ICD-10 Diagnosis Description L97.818 Non-pressure chronic ulcer of other part of right lower leg with other specified severity I89.0 Lymphedema, not elsewhere classified Quantity: 1 Electronic Signature(s) Signed: 10/29/2022 12:41:49 PM By: Pearletha Alfred Signed: 10/29/2022 1:38:23 PM By: [name] Corwin DO Previous Signature: 10/23/2022 4:25:00 PM Version By: [name] Corwin DO Entered By: Pearletha Alfred on 10/29/2022 12:41:49

## 2022-10-24 ENCOUNTER — Ambulatory Visit: Payer: Medicare Other | Admitting: Physical Therapy

## 2022-10-25 ENCOUNTER — Ambulatory Visit: Payer: Medicare Other

## 2022-10-25 DIAGNOSIS — R29898 Other symptoms and signs involving the musculoskeletal system: Secondary | ICD-10-CM

## 2022-10-25 DIAGNOSIS — M6281 Muscle weakness (generalized): Secondary | ICD-10-CM | POA: Diagnosis not present

## 2022-10-25 DIAGNOSIS — R531 Weakness: Secondary | ICD-10-CM | POA: Diagnosis not present

## 2022-10-25 DIAGNOSIS — R262 Difficulty in walking, not elsewhere classified: Secondary | ICD-10-CM | POA: Diagnosis not present

## 2022-10-25 NOTE — Therapy (Signed)
OUTPATIENT PHYSICAL THERAPY LOWER EXTREMITY TREATMENT   Patient Name: [name] MRN: [id number] DOB:[birth year], 72 y.o., male Today's Date: 10/25/2022  END OF SESSION:  PT End of Session - 10/25/22 1449     Visit Number 2    Number of Visits 16    Date for PT Re-Evaluation 12/04/22    Authorization Type Medicare    Progress Note Due on Visit 10    PT Start Time 1450    PT Stop Time 1540    PT Time Calculation (min) 50 min    Activity Tolerance Patient tolerated treatment well    Behavior During Therapy WFL for tasks assessed/performed             Past Medical History:  Diagnosis Date   Autoimmune disease (HCC)    Bronchitis    Chronic HFrEF (heart failure with reduced ejection fraction) (HCC)    normalized EF   Chronic hiccups    Chronic kidney disease, stage 3a (HCC)    DM2 (diabetes mellitus, type 2) (HCC) 09/28/2014   DVT (deep venous thrombosis) (HCC)    GERD (gastroesophageal reflux disease)    Hypogonadism in male 04/01/2018   Hypothyroidism    Joint pain    Necrotizing myopathy    Nonobstructive atherosclerosis of coronary artery    Posttraumatic stress disorder 01/30/2022   PTSD (post-traumatic stress disorder)    Pulmonary embolism (HCC)    Rheumatoid arthritis (HCC) 08/09/2021   RSV (respiratory syncytial virus pneumonia) 03/11/2022   Past Surgical History:  Procedure Laterality Date   BIOPSY SHOULDER     Left   IR ANGIOGRAM PULMONARY BILATERAL SELECTIVE  07/22/2021   IR THROMBECT VENO MECH MOD SED  07/22/2021   IR US GUIDE VASC ACCESS LEFT  07/22/2021   RADIOLOGY WITH ANESTHESIA Right 07/22/2021   Procedure: IR WITH ANESTHESIA;  Surgeon: Radiologist, Medication, MD;  Location: [facility] OR;  Service: Radiology;  Laterality: Right;   UPPER LEG SOFT TISSUE BIOPSY Right    Patient Active Problem List   Diagnosis Date Noted   Lymphedema 09/13/2022   Syncope and collapse 09/13/2022   Syncope 09/12/2022   Venous stasis ulcer without varicose veins (HCC)  09/09/2022   Anemia 09/09/2022   Skin ulcer of buttock (HCC) 09/09/2022   Generalized weakness 09/09/2022   Diarrhea 09/09/2022   Oropharyngeal dysphagia 04/23/2022   Leg swelling 04/23/2022   Confusion 04/23/2022   Type 2 diabetes mellitus with other diabetic ophthalmic complication (HCC) 04/16/2022   Shoulder pain 03/22/2022   Hypoglycemia 03/20/2022   Frequent falls 03/19/2022   Chronic combined systolic and diastolic CHF (congestive heart failure) (HCC) 03/11/2022   CKD (chronic kidney disease), stage III (HCC) 03/11/2022   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   History of DVT (deep vein thrombosis) 03/11/2022   History of pulmonary embolism 03/11/2022   Hyperlipidemia 03/11/2022   Obesity (BMI 30-39.9) 03/11/2022   CAD (coronary artery disease) 01/30/2022   Depression 01/30/2022   Cardiomyopathy (HCC) 01/30/2022   Posttraumatic stress disorder 01/30/2022   Rheumatoid arthritis (HCC) 08/09/2021   New onset of headaches after age 25 08/09/2021   Non-ST elevation (NSTEMI) myocardial infarction [facility])    Acute pulmonary embolism (HCC) 07/22/2021   Acute DVT (deep venous thrombosis) (HCC) 07/22/2021   Elevated troponin 07/22/2021   Anxiety 07/22/2021   Ankle swelling 01/17/2020   GERD (gastroesophageal reflux disease) 06/04/2018   Hypogonadism in male 04/01/2018   Macrocytic anemia 09/30/2017   Itching 09/30/2017   Hypothyroidism 09/30/2017   Low testosterone 09/30/2017  Benign prostatic hyperplasia without lower urinary tract symptoms 09/30/2017   Erectile dysfunction 03/19/2016   Benign essential HTN 08/08/2015   Branch retinal vein occlusion with macular edema of left eye 03/25/2015   Nuclear sclerotic cataract of both eyes 03/25/2015   Myositis 09/28/2014   DM2 (diabetes mellitus, type 2) (HCC) 09/28/2014   Abnormal glucose 10/04/2012    PCP: Everrett Coombe, DO  REFERRING PROVIDER: Everrett Coombe, DO  REFERRING DIAG: R53.1 (ICD-10-CM) - Generalized weakness  THERAPY  DIAG:  Muscle weakness (generalized)  Difficulty in walking, not elsewhere classified  Other symptoms and signs involving the musculoskeletal system  Rationale for Evaluation and Treatment: Rehabilitation  ONSET DATE: worsening for ~1 year  SUBJECTIVE:   SUBJECTIVE STATEMENT: Patient's wife present for session to assist with communication as needed due to patient having difficulty with prolonged talking post-dental procedure/complications. Patient had to cancel last few weeks of therapy due to complications with dental work and blood thinners. Patient's wife states he has been having a lot of pain due to complicating medications. Wound on R leg is healing - small wound is healed and large wound is healing slowly. Patient reports   Eval: Pt has necrotizing myopathy (was in the [employer] and exposed to [employer]). Wife states his muscles are shredded and he has lymphedema. Wife reports L side is weaker. No CVA, seizure or heart attacks. Pt has a bike at home that he works on but no real exercise regimen. He has had PT in the past but has not maintained. Has wound on R LE from the lymphedema. Pt is having a hard time walking up stairs. Used to be able to use cane. Has been using a walker as both feet have drop foot. Has not been able to sleep upstairs. Pt reports 4 hospital admissions in the past year. Pt states he is only able to perform static standing for 1 min.   PERTINENT HISTORY: history of autoimmune myopathy, rheumatoid arthritis, diabetes mellitus type 2, DVT and PE on Eliquis, chronic combined CHF, nonobstructive CAD, chronic lymphedema, CKD stage IIIa, OSA, GERD, hypothyroidism  PAIN:  Are you having pain? Yes: NPRS scale: currently 4, up to 15/10 Pain location: Mostly in knees Pain description: aching Aggravating factors: intermittent Relieving factors: tylenol  PRECAUTIONS: None  WEIGHT BEARING RESTRICTIONS: No  FALLS:  Has patient fallen in last 6 months? Yes. Number of  falls 5 -- happened during walking  LIVING ENVIRONMENT: Lives with: lives with their family and lives with their spouse son is home from college Lives in: House/apartment Stairs: Yes: Internal: 17 steps; on right going up and External: 4 in garage, 5 in front steps; none Has following equipment at home: Quad cane large base and manual wheelchair/rollator convertible  OCCUPATION: Retired Hotel manager  PLOF: Independent  PATIENT GOALS: Walk with the cane inside the house, less assist to go up/down stairs  NEXT MD VISIT: n/a  OBJECTIVE:   DIAGNOSTIC FINDINGS: nothing recent  PATIENT SURVEYS:  LEFS 10%  COGNITION: Overall cognitive status: Within functional limits for tasks assessed     SENSATION: Notes history of neuropathy  EDEMA:  Lymphedema bilat   MUSCLE LENGTH: did not assess  POSTURE: rounded shoulders, increased thoracic kyphosis, and flexed trunk   PALPATION: Did not assess  LOWER EXTREMITY ROM:  Active ROM Right eval Left eval  Hip flexion    Hip extension    Hip abduction    Hip adduction    Hip internal rotation    Hip external rotation  Knee flexion    Knee extension    Ankle dorsiflexion    Ankle plantarflexion    Ankle inversion    Ankle eversion     (Blank rows = not tested)  LOWER EXTREMITY MMT:  MMT Right eval Left eval  Hip flexion 2+ 3-  Hip extension    Hip abduction 4- 4-  Hip adduction 4- 5  Hip internal rotation    Hip external rotation    Knee flexion 3+ 3+  Knee extension 2+ 3-  Ankle dorsiflexion    Ankle plantarflexion    Ankle inversion    Ankle eversion     (Blank rows = not tested)  LOWER EXTREMITY SPECIAL TESTS:  Did not assess  FUNCTIONAL TESTS:  5 times sit to stand: 40 sec TUG: 54 sec  GAIT: Distance walked: 100' Assistive device utilized: Environmental consultant - 4 wheeled Level of assistance: SBA Comments: Increased trunk flexion, decreased heel strike   TODAY'S TREATMENT:   [facility] Adult PT Treatment:                                                 DATE: 10/25/2022 Therapeutic Activity: STS minA-CGA Standing lateral weight shifting Standing marching Walking CGA (4WW) 2x80" Seated on airex: Heels slides LAQs Resisted marching (RTB above knees) x10, no resistance x10  Glute set 10x3" Supine (head elevated with pillows): LTR Assisted SLR Min A sit <--> supine                                                                                                                              DATE: 10/09/22  See HEP below  PATIENT EDUCATION:  Education details: Updatedl HEP Person educated: Patient and Spouse Education method: Explanation, Demonstration, and Handouts Education comprehension: verbalized understanding, returned demonstration, and needs further education  HOME EXERCISE PROGRAM: Access Code: [email or web address] URL: [email or web address] Date: 10/25/2022 Prepared by: Carlynn Herald  Exercises - Seated Heel Slide  - 1 x daily - 7 x weekly - 2-3 sets - 10 reps - Seated Long Arc Quad  - 1 x daily - 7 x weekly - 2-3 sets - 10 reps - Seated Ankle Pumps  - 1 x daily - 7 x weekly - 2-3 sets - 10 reps - Seated Isometric Hip Adduction with Ball  - 1 x daily - 7 x weekly - 2-3 sets - 10 reps - 5 sec hold - Seated Hip Abduction with Resistance  - 1 x daily - 7 x weekly - 2-3 sets - 10 reps - 5 sec hold - Standing March with Counter Support  - 1 x daily - 7 x weekly - 3 sets - 10 reps - Seated March  - 1 x daily - 7 x weekly - 3 sets - 10 reps - Seated Gluteal Sets  - 1 x  daily - 7 x weekly - 3 sets - 10 reps - 3-5 sec hold  ASSESSMENT:  CLINICAL IMPRESSION: Patient instructed in sit to stand transfers with focus on safe hand placement and anterior weight shifting. Min A needed with bed mobility due to R LE weakness and L LE pain. Increased forward trunk flexion noted with fatigue during ambulation. Seated exercises continued to progress LE strength.   Eval: Patient is a 72 y.o. M who was  seen today for physical therapy evaluation and treatment for generalized weakness. PMH significant for lymphedema, neuropathy and Agent Orange exposure leading to myopathy. Assessment demos L UE weakness with R LE weakness leading to decreased balance, standing and walking tolerance. Pt demos high fall risk based on his 5x STS and TUG score. Pt would benefit from PT to improve his home mobility and improve limited community mobility  OBJECTIVE IMPAIRMENTS: Abnormal gait, decreased activity tolerance, decreased balance, decreased endurance, decreased mobility, difficulty walking, decreased ROM, decreased strength, increased muscle spasms, impaired flexibility, improper body mechanics, postural dysfunction, and pain.   ACTIVITY LIMITATIONS: standing, squatting, stairs, transfers, bathing, toileting, dressing, hygiene/grooming, and locomotion level  PARTICIPATION LIMITATIONS: meal prep, cleaning, laundry, driving, and shopping  PERSONAL FACTORS: Fitness, Past/current experiences, and Time since onset of injury/illness/exacerbation are also affecting patient's functional outcome.   REHAB POTENTIAL: Good  CLINICAL DECISION MAKING: Evolving/moderate complexity  EVALUATION COMPLEXITY: Moderate   GOALS: Goals reviewed with patient? Yes  SHORT TERM GOALS: Target date: 11/06/2022  Pt will be ind with initial HEP Baseline: Goal status: INITIAL  2.  Pt will have improved 5x STS to </=35 sec without UE support to demo increased functional LE strength Baseline:  Goal status: INITIAL  3.  Pt will have TUG time to </=50 sec with rollator to demo improving gait Baseline:  Goal status: INITIAL   LONG TERM GOALS: Target date: 12/04/2022   Pt will be ind with management and progression of HEP Baseline:  Goal status: INITIAL  2.  Pt will have TUG time to </=50 sec with SPC to demo improving gait for home Baseline:  Goal status: INITIAL  3.  Pt will be able to amb at least 400' with [facility] for home  mobility Baseline:  Goal status: INITIAL  4.  Pt will be able to ascend/descend 17 steps with SPC to get to his bedroom  Baseline:  Goal status: INITIAL  5.  Pt will have improved LEFS to 20% to demo MCID Baseline:  Goal status: INITIAL   PLAN:  PT FREQUENCY: 2x/week  PT DURATION: 8 weeks  PLANNED INTERVENTIONS: Therapeutic exercises, Therapeutic activity, Neuromuscular re-education, Balance training, Gait training, Patient/Family education, Self Care, Joint mobilization, Stair training, Orthotic/Fit training, Aquatic Therapy, Dry Needling, Electrical stimulation, Cryotherapy, Moist heat, Taping, Vasopneumatic device, Ionotophoresis 4mg /ml Dexamethasone, Manual therapy, and Re-evaluation  PLAN FOR NEXT SESSION: Assess response to HEP. Continue to strengthen bilat LE and core as tolerated. Initiate balance/stability exercises. Work on gait/endurance.   Sanjuana Mae, PTA 10/25/2022, 3:43 PM

## 2022-10-26 NOTE — Progress Notes (Signed)
[name] ([id number]) [id number] Page 1 of 9 Visit Report for 10/23/2022 Arrival Information Details Patient Name: Date of Service: [name], [name] 10/23/2022 2:45 PM Medical Record Number: [id number] Patient Account Number: [id number] Date of Birth/Sex: Treating RN: 06-Oct-1950 (72 y.o. [name] Primary Care [name]: [name] Other Clinician: Referring [name]: Treating [name]/Extender: [name] in Treatment: 2 Visit Information History Since Last Visit Added or deleted any medications: No Patient Arrived: Wheel Chair Any new allergies or adverse reactions: No Arrival Time: 15:07 Had a fall or experienced change in No Accompanied By: wife activities of daily living that may affect Transfer Assistance: None risk of falls: Patient Identification Verified: Yes Signs or symptoms of abuse/neglect since last visito No Secondary Verification Process Completed: Yes Hospitalized since last visit: No Patient Has Alerts: Yes Implantable device outside of the clinic excluding No Patient Alerts: ELIQUIS cellular tissue based products placed in the center since last visit: Has Dressing in Place as Prescribed: Yes Has Compression in Place as Prescribed: Yes Pain Present Now: No Electronic Signature(s) Signed: 10/26/2022 11:18:15 AM By: [name] Entered By: [name] on 10/23/2022 15:08:50 -------------------------------------------------------------------------------- Compression Therapy Details Patient Name: Date of Service: [name], [name] 10/23/2022 2:45 PM Medical Record Number: [id number] Patient Account Number: [id number] Date of Birth/Sex: Treating RN: 1950-09-07 (72 y.o. [name] Primary Care Elleah Hemsley: [name] Other Clinician: Referring Munir Victorian: Treating Zacary Bauer/Extender: [name] in Treatment: 2 Compression Therapy Performed for Wound Assessment: Wound  #3 Right,Lateral Lower Leg Performed By: Clinician [name] Ard, RN Compression Type: Four Layer Post Procedure Diagnosis Same as Pre-procedure Notes urgo regular Electronic Signature(s) Signed: 10/23/2022 3:59:56 PM By: [name] Ard RN Entered By: [name] on 10/23/2022 15:41:06 [name], [name] ([id number]) [id number] Page 2 of 9 -------------------------------------------------------------------------------- Encounter Discharge Information Details Patient Name: Date of Service: [name] 10/23/2022 2:45 PM Medical Record Number: [id number] Patient Account Number: [id number] Date of Birth/Sex: Treating RN: 12/07/1950 (72 y.o. [name] Primary Care Harriet Bollen: [name] Other Clinician: Referring Sabrea Sankey: Treating Alizey Noren/Extender: [name] in Treatment: 2 Encounter Discharge Information Items Discharge Condition: Stable Ambulatory Status: Wheelchair Discharge Destination: Home Transportation: Private Auto Accompanied By: wife Schedule Follow-up Appointment: Yes Clinical Summary of Care: Electronic Signature(s) Signed: 10/23/2022 3:59:56 PM By: [name] Ard RN Entered By: [name] on 10/23/2022 15:57:15 -------------------------------------------------------------------------------- Lower Extremity Assessment Details Patient Name: Date of Service: [name], [name] 10/23/2022 2:45 PM Medical Record Number: [id number] Patient Account Number: [id number] Date of Birth/Sex: Treating RN: [birth year] (72 y.o. [name] Primary Care [name]: [name] Other Clinician: Referring [name]: Treating [name]/Extender: [name] in Treatment: 2 Edema Assessment Assessed: [Left: No] [Right: No] Edema: [Left: Ye] [Right: s] Calf Left: Right: Point of Measurement: 37 cm From Medial Instep 49 cm Ankle Left: Right: Point of Measurement: 10 cm From Medial Instep 27  cm Electronic Signature(s) Signed: 10/23/2022 4:48:05 PM By: [name] Schwalbe RN Signed: 10/26/2022 11:18:15 AM By: [name] Entered By: [name] on 10/23/2022 15:24:51 Multi Wound Chart Details -------------------------------------------------------------------------------- [name] ([id number]) [id number] Page 3 of 9 Patient Name: Date of Service: [name] 10/23/2022 2:45 PM Medical Record Number: [id number] Patient Account Number: [id number] Date of Birth/Sex: Treating RN: May 14, 1950 (72 y.o. [name] Primary Care Dannell Raczkowski: [name] Other Clinician: Referring Tatisha Cerino: Treating [name]/Extender: [name] in Treatment: 2 Vital Signs Height(in): 68 Capillary Blood Glucose(mg/dl): 710 Weight(lbs): 626 Pulse(bpm): 91 Body Mass Index(BMI): 33.4 Blood  Pressure(mmHg): 111/63 Temperature(F): 98.5 Respiratory Rate(breaths/min): 18 Wound Assessments Wound Number: 2 3 N/A Photos: N/A Right, Anterior Lower Leg Right, Lateral Lower Leg N/A Wound Location: Gradually Appeared Gradually Appeared N/A Wounding Event: Lymphedema Lymphedema N/A Primary Etiology: Anemia, Lymphedema, Congestive Anemia, Lymphedema, Congestive N/A Comorbid History: Heart Failure, Deep Vein Thrombosis, Heart Failure, Deep Vein Thrombosis, Type II Diabetes, Rheumatoid Arthritis Type II Diabetes, Rheumatoid Arthritis 09/02/2022 09/02/2022 N/A Date Acquired: 2 2 N/A Weeks of Treatment: Healed - Epithelialized Open N/A Wound Status: No No N/A Wound Recurrence: 0x0x0 1x1x0.1 N/A Measurements L x W x D (cm) 0 0.785 N/A A (cm) : rea 0 0.079 N/A Volume (cm) : 100.00% 92.40% N/A % Reduction in Area: 100.00% 92.40% N/A % Reduction in Volume: Full Thickness Without Exposed Full Thickness Without Exposed N/A Classification: Support Structures Support Structures Medium Medium N/A Exudate Amount: Serous Serosanguineous  N/A Exudate Type: amber red, brown N/A Exudate Color: Distinct, outline attached N/A N/A Wound Margin: None Present (0%) Large (67-100%) N/A Granulation Amount: N/A Red, Pink N/A Granulation Quality: None Present (0%) None Present (0%) N/A Necrotic Amount: Fascia: No Fascia: No N/A Exposed Structures: Fat Layer (Subcutaneous Tissue): No Fat Layer (Subcutaneous Tissue): No Tendon: No Tendon: No Muscle: No Muscle: No Joint: No Joint: No Bone: No Bone: No Large (67-100%) Medium (34-66%) N/A Epithelialization: Excoriation: No No Abnormalities Noted N/A Periwound Skin Texture: Induration: No Callus: No Crepitus: No Rash: No Scarring: No Maceration: No No Abnormalities Noted N/A Periwound Skin Moisture: Dry/Scaly: No Atrophie Blanche: No No Abnormalities Noted N/A Periwound Skin Color: Cyanosis: No Ecchymosis: No Erythema: No Hemosiderin Staining: No Mottled: No Pallor: No Rubor: No No Abnormality No Abnormality N/A Temperature: N/A Compression Therapy N/A Procedures Performed: Treatment Notes Electronic Signature(s) Signed: 10/23/2022 4:25:00 PM By: Geralyn Corwin DO Signed: 10/23/2022 4:48:05 PM By: [name] Schwalbe RN Elissa Hefty, [place] ([id number]) [phone].pdf Page 4 of 9 Signed: 10/23/2022 4:48:05 PM By: [name] Schwalbe RN Entered By: Geralyn Corwin on 10/23/2022 15:49:42 -------------------------------------------------------------------------------- Multi-Disciplinary Care Plan Details Patient Name: Date of Service: [name], [name] 10/23/2022 2:45 PM Medical Record Number: [id number] Patient Account Number: [id number] Date of Birth/Sex: Treating RN: May 14, 1950 (72 y.o. [name] Primary Care Yalexa Blust: [name] Other Clinician: Referring Carman Auxier: Treating Dannisha Eckmann/Extender: [name] in Treatment: 2 Active Inactive Wound/Skin Impairment Nursing Diagnoses: Impaired tissue  integrity Goals: Patient/caregiver will verbalize understanding of skin care regimen Date Initiated: 10/04/2022 Target Resolution Date: 12/29/2022 Goal Status: Active Interventions: Assess ulceration(s) every visit Treatment Activities: Skin care regimen initiated : 10/04/2022 Notes: Electronic Signature(s) Signed: 10/23/2022 3:59:56 PM By: [name] Ard RN Entered By: [name] on 10/23/2022 15:36:57 -------------------------------------------------------------------------------- Pain Assessment Details Patient Name: Date of Service: [name], [name] 10/23/2022 2:45 PM Medical Record Number: [id number] Patient Account Number: [id number] Date of Birth/Sex: Treating RN: Nov 20, 1950 (72 y.o. [name] Primary Care Rainer Mounce: [name] Other Clinician: Referring Izadora Roehr: Treating Rexanne Inocencio/Extender: [name] in Treatment: 2 Active Problems Location of Pain Severity and Description of Pain Patient Has Paino No Site Locations [place], [place] ([id number]) [id number] Page 5 of 9 Pain Management and Medication Current Pain Management: Electronic Signature(s) Signed: 10/23/2022 4:48:05 PM By: [name] Schwalbe RN Signed: 10/26/2022 11:18:15 AM By: [name] Entered By: [name] on 10/23/2022 15:11:57 -------------------------------------------------------------------------------- Patient/Caregiver Education Details Patient Name: Date of Service: [name], [name] 6/25/2024andnbsp2:45 PM Medical Record Number: [id number] Patient Account Number: [id number] Date of Birth/Gender: Treating RN: 1950/08/26 (72 y.o. [name] Primary Care Physician: [name] Other Clinician:  Referring Physician: Treating Physician/Extender: [name] in Treatment: 2 Education Assessment Education Provided To: Patient Education Topics Provided Wound Debridement: Methods: Explain/Verbal Responses:  Reinforcements needed, State content correctly Wound/Skin Impairment: Methods: Explain/Verbal Responses: Reinforcements needed, State content correctly Electronic Signature(s) Signed: 10/23/2022 3:59:56 PM By: [name] Ard RN Entered By: [name] on 10/23/2022 15:37:13 Wound Assessment Details -------------------------------------------------------------------------------- [name] ([id number]) [id number] Page 6 of 9 Patient Name: Date of Service: [name], [name] 10/23/2022 2:45 PM Medical Record Number: [id number] Patient Account Number: [id number] Date of Birth/Sex: Treating RN: 04-10-1951 (72 y.o. [name] Primary Care Priest Lockridge: [name] Other Clinician: Referring Violette Morneault: Treating Rocsi Hazelbaker/Extender: [name] in Treatment: 2 Wound Status Wound Number: 2 Primary Lymphedema Etiology: Wound Location: Right, Anterior Lower Leg Wound Healed - Epithelialized Wounding Event: Gradually Appeared Status: Date Acquired: 09/02/2022 Comorbid Anemia, Lymphedema, Congestive Heart Failure, Deep Vein Weeks Of Treatment: 2 History: Thrombosis, Type II Diabetes, Rheumatoid Arthritis Clustered Wound: No Photos Wound Measurements Length: (cm) 0 % Reduction in Area: 100% Width: (cm) 0 % Reduction in Volume: 100% Depth: (cm) 0 Epithelialization: Large (67-100%) Area: (cm) 0 Tunneling: No Volume: (cm) 0 Undermining: No Wound Description Classification: Full Thickness Without Exposed Support Structures Foul Odor After Cleansing: No Wound Margin: Distinct, outline attached Slough/Fibrino Yes Exudate Amount: Medium Exudate Type: Serous Exudate Color: amber Wound Bed Granulation Amount: None Present (0%) Exposed Structure Necrotic Amount: None Present (0%) Fascia Exposed: No Fat Layer (Subcutaneous Tissue) Exposed: No Tendon Exposed: No Muscle Exposed: No Joint Exposed: No Bone Exposed: No Periwound Skin  Texture Texture Color No Abnormalities Noted: Yes No Abnormalities Noted: Yes Moisture Temperature / Pain No Abnormalities Noted: Yes Temperature: No Abnormality Treatment Notes Wound #2 (Lower Leg) Wound Laterality: Right, Anterior Cleanser Peri-Wound Care Topical Primary Dressing Secondary Dressing Secured With Compression Doreon Osada, [name] ([id number]) [id number] Page 7 of 9 Compression Stockings Add-Ons Electronic Signature(s) Signed: 10/23/2022 3:59:56 PM By: [name] Ard RN Entered By: [name] on 10/23/2022 15:38:06 -------------------------------------------------------------------------------- Wound Assessment Details Patient Name: Date of Service: [name], [name] 10/23/2022 2:45 PM Medical Record Number: [id number] Patient Account Number: [id number] Date of Birth/Sex: Treating RN: 12-27-1950 (72 y.o. [name] Primary Care Juliett Eastburn: [name] Other Clinician: Referring Julane Crock: Treating Tirza Senteno/Extender: [name] in Treatment: 2 Wound Status Wound Number: 3 Primary Lymphedema Etiology: Wound Location: Right, Lateral Lower Leg Wound Open Wounding Event: Gradually Appeared Status: Date Acquired: 09/02/2022 Comorbid Anemia, Lymphedema, Congestive Heart Failure, Deep Vein Weeks Of Treatment: 2 History: Thrombosis, Type II Diabetes, Rheumatoid Arthritis Clustered Wound: No Photos Wound Measurements Length: (cm) 1 Width: (cm) 1 Depth: (cm) 0.1 Area: (cm) 0.785 Volume: (cm) 0.079 % Reduction in Area: 92.4% % Reduction in Volume: 92.4% Epithelialization: Medium (34-66%) Tunneling: No Undermining: No Wound Description Classification: Full Thickness Without Exposed Suppor Exudate Amount: Medium Exudate Type: Serosanguineous Exudate Color: red, brown t Structures Foul Odor After Cleansing: No Slough/Fibrino Yes Wound Bed Granulation Amount: Large (67-100%) Exposed  Structure Granulation Quality: Red, Pink Fascia Exposed: No Necrotic Amount: None Present (0%) Fat Layer (Subcutaneous Tissue) Exposed: No Tendon Exposed: No Muscle Exposed: No Joint Exposed: No Bone Exposed: No Periwound Skin Texture Texture Color No Abnormalities Noted: Yes No Abnormalities Noted: Yes Moisture Temperature / Pain No Abnormalities Noted: Yes Temperature: No Abnormality [name], [name] ([id number]) [id number] Page 8 of 9 Treatment Notes Wound #3 (Lower Leg) Wound Laterality: Right, Lateral Cleanser Soap and Water Discharge Instruction: May shower and wash wound with dial antibacterial soap  and water prior to dressing change. Vashe 5.8 (oz) Discharge Instruction: or Cleanse the wound with Vashe prior to applying a clean dressing using gauze sponges, not tissue or cotton balls. Wound Cleanser Discharge Instruction: or Cleanse the wound with wound cleanser prior to applying a clean dressing using gauze sponges, not tissue or cotton balls. Peri-Wound Care Topical Primary Dressing Maxorb Extra Ag+ Alginate Dressing, 4x4.75 (in/in) Discharge Instruction: Apply to wound bed as instructed Secondary Dressing ABD Pad, 8x10 Discharge Instruction: Apply over primary dressing as directed. CarboFLEX Odor Control Dressing, 4x4 in Discharge Instruction: Apply over primary dressing as directed. Woven Gauze Sponge, Non-Sterile 4x4 in Discharge Instruction: Apply over primary dressing as directed. Secured With Paper Tape, 2x10 (in/yd) Discharge Instruction: Secure dressing with tape as directed. Compression Wrap FourPress (4 layer compression wrap) Discharge Instruction: Apply four layer compression as directed. May also use Urgo K2 compression system as alternative. Stockinette Compression Stockings Facilities manager) Signed: 10/23/2022 4:48:05 PM By: [name] Schwalbe RN Signed: 10/26/2022 11:18:15 AM By: [name] Entered By:  [name] on 10/23/2022 15:29:36 -------------------------------------------------------------------------------- Vitals Details Patient Name: Date of Service: [name], [name] 10/23/2022 2:45 PM Medical Record Number: [id number] Patient Account Number: [id number] Date of Birth/Sex: Treating RN: [birth year] (72 y.o. [name] Primary Care Berlinda Farve: [name] Other Clinician: Referring Anjenette Gerbino: Treating Zionna Homewood/Extender: [name] in Treatment: 2 Vital Signs Time Taken: 15:08 Temperature (F): 98.5 Height (in): 68 Pulse (bpm): 91 Weight (lbs): 220 Respiratory Rate (breaths/min): 18 Body Mass Index (BMI): 33.4 Blood Pressure (mmHg): 111/63 Capillary Blood Glucose (mg/dl): 409 Reference Range: 80 - 120 mg / dl [name], [name] ([id number]) [id number] Page 9 of 9 Electronic Signature(s) Signed: 10/26/2022 11:18:15 AM By: [name] Entered By: [name] on 10/23/2022 15:11:52

## 2022-10-30 ENCOUNTER — Encounter (HOSPITAL_BASED_OUTPATIENT_CLINIC_OR_DEPARTMENT_OTHER): Payer: Medicare Other | Attending: General Surgery | Admitting: General Surgery

## 2022-10-30 ENCOUNTER — Ambulatory Visit: Payer: Medicare Other | Attending: Family Medicine | Admitting: Physical Therapy

## 2022-10-30 ENCOUNTER — Encounter: Payer: Self-pay | Admitting: Physical Therapy

## 2022-10-30 DIAGNOSIS — Z86718 Personal history of other venous thrombosis and embolism: Secondary | ICD-10-CM | POA: Diagnosis not present

## 2022-10-30 DIAGNOSIS — Z86711 Personal history of pulmonary embolism: Secondary | ICD-10-CM | POA: Insufficient documentation

## 2022-10-30 DIAGNOSIS — L89312 Pressure ulcer of right buttock, stage 2: Secondary | ICD-10-CM | POA: Diagnosis not present

## 2022-10-30 DIAGNOSIS — R29898 Other symptoms and signs involving the musculoskeletal system: Secondary | ICD-10-CM | POA: Insufficient documentation

## 2022-10-30 DIAGNOSIS — M6281 Muscle weakness (generalized): Secondary | ICD-10-CM | POA: Insufficient documentation

## 2022-10-30 DIAGNOSIS — I87311 Chronic venous hypertension (idiopathic) with ulcer of right lower extremity: Secondary | ICD-10-CM | POA: Diagnosis not present

## 2022-10-30 DIAGNOSIS — L97818 Non-pressure chronic ulcer of other part of right lower leg with other specified severity: Secondary | ICD-10-CM | POA: Diagnosis not present

## 2022-10-30 DIAGNOSIS — B370 Candidal stomatitis: Secondary | ICD-10-CM | POA: Diagnosis not present

## 2022-10-30 DIAGNOSIS — G7249 Other inflammatory and immune myopathies, not elsewhere classified: Secondary | ICD-10-CM | POA: Insufficient documentation

## 2022-10-30 DIAGNOSIS — R262 Difficulty in walking, not elsewhere classified: Secondary | ICD-10-CM | POA: Insufficient documentation

## 2022-10-30 DIAGNOSIS — E11621 Type 2 diabetes mellitus with foot ulcer: Secondary | ICD-10-CM | POA: Diagnosis not present

## 2022-10-30 DIAGNOSIS — L89322 Pressure ulcer of left buttock, stage 2: Secondary | ICD-10-CM | POA: Insufficient documentation

## 2022-10-30 DIAGNOSIS — I890 Lymphedema, not elsewhere classified: Secondary | ICD-10-CM | POA: Diagnosis not present

## 2022-10-30 DIAGNOSIS — I509 Heart failure, unspecified: Secondary | ICD-10-CM | POA: Diagnosis not present

## 2022-10-30 NOTE — Therapy (Signed)
OUTPATIENT PHYSICAL THERAPY LOWER EXTREMITY TREATMENT   Patient Name: [name] MRN: [id number] DOB:Jun 23, 1950, 72 y.o., male Today's Date: 10/30/2022  END OF SESSION:  PT End of Session - 10/30/22 1455     Visit Number 3    Number of Visits 16    Date for PT Re-Evaluation 12/04/22    Authorization Type Medicare    Progress Note Due on Visit 10    PT Start Time 1455   late arrival   PT Stop Time 1530    PT Time Calculation (min) 35 min    Activity Tolerance Patient tolerated treatment well    Behavior During Therapy WFL for tasks assessed/performed             Past Medical History:  Diagnosis Date   Autoimmune disease (HCC)    Bronchitis    Chronic HFrEF (heart failure with reduced ejection fraction) (HCC)    normalized EF   Chronic hiccups    Chronic kidney disease, stage 3a (HCC)    DM2 (diabetes mellitus, type 2) (HCC) 09/28/2014   DVT (deep venous thrombosis) (HCC)    GERD (gastroesophageal reflux disease)    Hypogonadism in male 04/01/2018   Hypothyroidism    Joint pain    Necrotizing myopathy    Nonobstructive atherosclerosis of coronary artery    Posttraumatic stress disorder 01/30/2022   PTSD (post-traumatic stress disorder)    Pulmonary embolism (HCC)    Rheumatoid arthritis (HCC) 08/09/2021   RSV (respiratory syncytial virus pneumonia) 03/11/2022   Past Surgical History:  Procedure Laterality Date   BIOPSY SHOULDER     Left   IR ANGIOGRAM PULMONARY BILATERAL SELECTIVE  07/22/2021   IR THROMBECT VENO MECH MOD SED  07/22/2021   IR US GUIDE VASC ACCESS LEFT  07/22/2021   RADIOLOGY WITH ANESTHESIA Right 07/22/2021   Procedure: IR WITH ANESTHESIA;  Surgeon: Radiologist, Medication, MD;  Location: [facility] OR;  Service: Radiology;  Laterality: Right;   UPPER LEG SOFT TISSUE BIOPSY Right    Patient Active Problem List   Diagnosis Date Noted   Lymphedema 09/13/2022   Syncope and collapse 09/13/2022   Syncope 09/12/2022   Venous stasis ulcer without varicose  veins (HCC) 09/09/2022   Anemia 09/09/2022   Skin ulcer of buttock (HCC) 09/09/2022   Generalized weakness 09/09/2022   Diarrhea 09/09/2022   Oropharyngeal dysphagia 04/23/2022   Leg swelling 04/23/2022   Confusion 04/23/2022   Type 2 diabetes mellitus with other diabetic ophthalmic complication (HCC) 04/16/2022   Shoulder pain 03/22/2022   Hypoglycemia 03/20/2022   Frequent falls 03/19/2022   Chronic combined systolic and diastolic CHF (congestive heart failure) (HCC) 03/11/2022   CKD (chronic kidney disease), stage III (HCC) 03/11/2022   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   History of DVT (deep vein thrombosis) 03/11/2022   History of pulmonary embolism 03/11/2022   Hyperlipidemia 03/11/2022   Obesity (BMI 30-39.9) 03/11/2022   CAD (coronary artery disease) 01/30/2022   Depression 01/30/2022   Cardiomyopathy (HCC) 01/30/2022   Posttraumatic stress disorder 01/30/2022   Rheumatoid arthritis (HCC) 08/09/2021   New onset of headaches after age 36 08/09/2021   Non-ST elevation (NSTEMI) myocardial infarction [facility])    Acute pulmonary embolism (HCC) 07/22/2021   Acute DVT (deep venous thrombosis) (HCC) 07/22/2021   Elevated troponin 07/22/2021   Anxiety 07/22/2021   Ankle swelling 01/17/2020   GERD (gastroesophageal reflux disease) 06/04/2018   Hypogonadism in male 04/01/2018   Macrocytic anemia 09/30/2017   Itching 09/30/2017   Hypothyroidism 09/30/2017   Low  testosterone 09/30/2017   Benign prostatic hyperplasia without lower urinary tract symptoms 09/30/2017   Erectile dysfunction 03/19/2016   Benign essential HTN 08/08/2015   Branch retinal vein occlusion with macular edema of left eye 03/25/2015   Nuclear sclerotic cataract of both eyes 03/25/2015   Myositis 09/28/2014   DM2 (diabetes mellitus, type 2) (HCC) 09/28/2014   Abnormal glucose 10/04/2012    PCP: Everrett Coombe, DO  REFERRING PROVIDER: Everrett Coombe, DO  REFERRING DIAG: R53.1 (ICD-10-CM) - Generalized  weakness  THERAPY DIAG:  Muscle weakness (generalized)  Difficulty in walking, not elsewhere classified  Other symptoms and signs involving the musculoskeletal system  Rationale for Evaluation and Treatment: Rehabilitation  ONSET DATE: worsening for ~1 year  SUBJECTIVE:   SUBJECTIVE STATEMENT: Pt states some pain after the dental procedure but otherwise no pain. Wife states pt was able to lift up his legs at the wound care center.   Eval: Pt has necrotizing myopathy (was in the [employer] and exposed to [employer]). Wife states his muscles are shredded and he has lymphedema. Wife reports L side is weaker. No CVA, seizure or heart attacks. Pt has a bike at home that he works on but no real exercise regimen. He has had PT in the past but has not maintained. Has wound on R LE from the lymphedema. Pt is having a hard time walking up stairs. Used to be able to use cane. Has been using a walker as both feet have drop foot. Has not been able to sleep upstairs. Pt reports 4 hospital admissions in the past year. Pt states he is only able to perform static standing for 1 min.   PERTINENT HISTORY: history of autoimmune myopathy, rheumatoid arthritis, diabetes mellitus type 2, DVT and PE on Eliquis, chronic combined CHF, nonobstructive CAD, chronic lymphedema, CKD stage IIIa, OSA, GERD, hypothyroidism  PAIN:  Are you having pain? Yes: NPRS scale: currently 4, up to 15/10 Pain location: Mostly in knees Pain description: aching Aggravating factors: intermittent Relieving factors: tylenol  PRECAUTIONS: None  WEIGHT BEARING RESTRICTIONS: No  FALLS:  Has patient fallen in last 6 months? Yes. Number of falls 5 -- happened during walking  LIVING ENVIRONMENT: Lives with: lives with their family and lives with their spouse son is home from college Lives in: House/apartment Stairs: Yes: Internal: 17 steps; on right going up and External: 4 in garage, 5 in front steps; none Has following equipment  at home: Quad cane large base and manual wheelchair/rollator convertible  OCCUPATION: Retired Hotel manager  PLOF: Independent  PATIENT GOALS: Walk with the cane inside the house, less assist to go up/down stairs  NEXT MD VISIT: n/a  OBJECTIVE: (Measures in this section from initial evaluation unless otherwise noted)  DIAGNOSTIC FINDINGS: nothing recent  PATIENT SURVEYS:  LEFS 10%  COGNITION: Overall cognitive status: Within functional limits for tasks assessed     SENSATION: Notes history of neuropathy  EDEMA:  Lymphedema bilat   MUSCLE LENGTH: did not assess  POSTURE: rounded shoulders, increased thoracic kyphosis, and flexed trunk   PALPATION: Did not assess  LOWER EXTREMITY ROM:  Active ROM Right eval Left eval  Hip flexion    Hip extension    Hip abduction    Hip adduction    Hip internal rotation    Hip external rotation    Knee flexion    Knee extension    Ankle dorsiflexion    Ankle plantarflexion    Ankle inversion    Ankle eversion     (  Blank rows = not tested)  LOWER EXTREMITY MMT:  MMT Right eval Left eval  Hip flexion 2+ 3-  Hip extension    Hip abduction 4- 4-  Hip adduction 4- 5  Hip internal rotation    Hip external rotation    Knee flexion 3+ 3+  Knee extension 2+ 3-  Ankle dorsiflexion    Ankle plantarflexion    Ankle inversion    Ankle eversion     (Blank rows = not tested)  LOWER EXTREMITY SPECIAL TESTS:  Did not assess  FUNCTIONAL TESTS:  5 times sit to stand: 40 sec TUG: 54 sec  GAIT: Distance walked: 100' Assistive device utilized: Environmental consultant - 4 wheeled Level of assistance: SBA Comments: Increased trunk flexion, decreased heel strike   TODAY'S TREATMENT:   [facility] Adult PT Treatment:                                                DATE: 10/30/22 Therapeutic Exercise: Seated heel slide for warm up x10 Seated hamstring stretch x 30 sec Seated figure 4 stretch x 30 sec Seated anterior/posterior pelvic tilting x10 Seated  anterior pelvic tilt with scap squeeze 3x10 sec Seated pball press down 10x5 sec Seated pball press down + LAQ 2x10 Seated marching 2x10 Standing gastroc stretch x30 sec R&L Standing glute set 2x10 Gait: 1 1/4 laps around gym with rollator CGA for safety; limited due to fatigue, small shuffling steps with decreased heel strike due to drop foot   [facility] Adult PT Treatment:                                                DATE: 10/25/2022 Therapeutic Activity: STS minA-CGA Standing lateral weight shifting Standing marching Walking CGA (4WW) 2x80" Seated on airex: Heels slides LAQs Resisted marching (RTB above knees) x10, no resistance x10  Glute set 10x3" Supine (head elevated with pillows): LTR Assisted SLR Min A sit <--> supine                                                                                                                              DATE: 10/09/22  See HEP below  PATIENT EDUCATION:  Education details: Updatedl HEP Person educated: Patient and Spouse Education method: Explanation, Demonstration, and Handouts Education comprehension: verbalized understanding, returned demonstration, and needs further education  HOME EXERCISE PROGRAM: Access Code: [email or web address] URL: [email or web address] Date: 10/25/2022 Prepared by: Carlynn Herald  Exercises - Seated Heel Slide  - 1 x daily - 7 x weekly - 2-3 sets - 10 reps - Seated Long Arc Quad  - 1 x daily - 7 x weekly - 2-3 sets - 10 reps - Seated Ankle Pumps  -  1 x daily - 7 x weekly - 2-3 sets - 10 reps - Seated Isometric Hip Adduction with Ball  - 1 x daily - 7 x weekly - 2-3 sets - 10 reps - 5 sec hold - Seated Hip Abduction with Resistance  - 1 x daily - 7 x weekly - 2-3 sets - 10 reps - 5 sec hold - Standing March with Counter Support  - 1 x daily - 7 x weekly - 3 sets - 10 reps - Seated March  - 1 x daily - 7 x weekly - 3 sets - 10 reps - Seated Gluteal Sets  - 1 x daily - 7 x weekly - 3 sets - 10 reps -  3-5 sec hold  ASSESSMENT:  CLINICAL IMPRESSION: Reviewed seated exercises. Added seated core work with good pt tolerance. Continued to work on [employer] for endurance. Only tolerated a little over a lap today before fatigue.   Eval: Patient is a 72 y.o. M who was seen today for physical therapy evaluation and treatment for generalized weakness. PMH significant for lymphedema, neuropathy and Agent Orange exposure leading to myopathy. Assessment demos L UE weakness with R LE weakness leading to decreased balance, standing and walking tolerance. Pt demos high fall risk based on his 5x STS and TUG score. Pt would benefit from PT to improve his home mobility and improve limited community mobility  OBJECTIVE IMPAIRMENTS: Abnormal gait, decreased activity tolerance, decreased balance, decreased endurance, decreased mobility, difficulty walking, decreased ROM, decreased strength, increased muscle spasms, impaired flexibility, improper body mechanics, postural dysfunction, and pain.   ACTIVITY LIMITATIONS: standing, squatting, stairs, transfers, bathing, toileting, dressing, hygiene/grooming, and locomotion level  PARTICIPATION LIMITATIONS: meal prep, cleaning, laundry, driving, and shopping  PERSONAL FACTORS: Fitness, Past/current experiences, and Time since onset of injury/illness/exacerbation are also affecting patient's functional outcome.   REHAB POTENTIAL: Good  CLINICAL DECISION MAKING: Evolving/moderate complexity  EVALUATION COMPLEXITY: Moderate   GOALS: Goals reviewed with patient? Yes  SHORT TERM GOALS: Target date: 11/06/2022  Pt will be ind with initial HEP Baseline: Goal status: INITIAL  2.  Pt will have improved 5x STS to </=35 sec without UE support to demo increased functional LE strength Baseline:  Goal status: INITIAL  3.  Pt will have TUG time to </=50 sec with rollator to demo improving gait Baseline:  Goal status: INITIAL   LONG TERM GOALS: Target date:  12/04/2022   Pt will be ind with management and progression of HEP Baseline:  Goal status: INITIAL  2.  Pt will have TUG time to </=50 sec with SPC to demo improving gait for home Baseline:  Goal status: INITIAL  3.  Pt will be able to amb at least 400' with [facility] for home mobility Baseline:  Goal status: INITIAL  4.  Pt will be able to ascend/descend 17 steps with SPC to get to his bedroom  Baseline:  Goal status: INITIAL  5.  Pt will have improved LEFS to 20% to demo [facility] Baseline:  Goal status: INITIAL   PLAN:  PT FREQUENCY: 2x/week  PT DURATION: 8 weeks  PLANNED INTERVENTIONS: Therapeutic exercises, Therapeutic activity, Neuromuscular re-education, Balance training, Gait training, Patient/Family education, Self Care, Joint mobilization, Stair training, Orthotic/Fit training, Aquatic Therapy, Dry Needling, Electrical stimulation, Cryotherapy, Moist heat, Taping, Vasopneumatic device, Ionotophoresis 4mg /ml Dexamethasone, Manual therapy, and Re-evaluation  PLAN FOR NEXT SESSION: Assess response to HEP. Continue to strengthen bilat LE and core as tolerated. Initiate balance/stability exercises. Work on gait/endurance.   Jazlen Ogarro April  Dell Ponto, PT 10/30/2022, 2:55 PM

## 2022-10-30 NOTE — Progress Notes (Signed)
[name], [name] ([id number]) [id number] Page 1 of 4 Visit Report for 10/30/2022 Arrival Information Details Patient Name: Date of Service: [name], [name] 10/30/2022 11:30 A M Medical Record Number: [id number] Patient Account Number: [id number] Date of Birth/Sex: Treating RN: [birth year] (72 y.o. M) Primary Care Deicy Rusk: Everrett Coombe Other Clinician: Referring Stanly Si: Treating Tuesday Terlecki/Extender: Scherrie Merritts in Treatment: 3 Visit Information History Since Last Visit Added or deleted any medications: No Patient Arrived: Wheel Chair Any new allergies or adverse reactions: No Arrival Time: 11:47 Had a fall or experienced change in No Accompanied By: wife activities of daily living that may affect Transfer Assistance: None risk of falls: Patient Identification Verified: Yes Signs or symptoms of abuse/neglect since last visito No Secondary Verification Process Completed: Yes Hospitalized since last visit: No Patient Has Alerts: Yes Implantable device outside of the clinic excluding No Patient Alerts: ELIQUIS cellular tissue based products placed in the center since last visit: Has Compression in Place as Prescribed: Yes Pain Present Now: No Electronic Signature(s) Signed: 10/30/2022 5:11:40 PM By: Thayer Dallas Entered By: Thayer Dallas on 10/30/2022 11:48:04 -------------------------------------------------------------------------------- Compression Therapy Details Patient Name: Date of Service: [name], [name] 10/30/2022 11:30 A M Medical Record Number: [id number] Patient Account Number: [id number] Date of Birth/Sex: Treating RN: 08-12-1950 (72 y.o. M) Primary Care Merlie Noga: Everrett Coombe Other Clinician: Referring Alrick Cubbage: Treating Taje Tondreau/Extender: Scherrie Merritts in Treatment: 3 Compression Therapy Performed for Wound Assessment: Wound #3 Right,Lateral Lower Leg Performed By: Clinician Thayer Dallas, Compression Type: Double Layer Electronic Signature(s) Signed: 10/30/2022 5:11:40 PM By: Thayer Dallas Entered By: Thayer Dallas on 10/30/2022 16:44:55 -------------------------------------------------------------------------------- Encounter Discharge Information Details Patient Name: Date of Service: [name], [name] 10/30/2022 11:30 A M Medical Record Number: [id number] Patient Account Number: [id number] Date of Birth/Sex: Treating RN: February 11, 1951 (72 y.o. [name], [name] ([id number]) [id number] Page 2 of 4 Primary Care Eunice Oldaker: Everrett Coombe Other Clinician: Thayer Dallas Referring Shevelle Smither: Treating Jonnathan Birman/Extender: Scherrie Merritts in Treatment: 3 Encounter Discharge Information Items Discharge Condition: Stable Ambulatory Status: Wheelchair Discharge Destination: Home Transportation: Private Auto Accompanied By: wife Schedule Follow-up Appointment: Yes Clinical Summary of Care: Electronic Signature(s) Signed: 10/30/2022 5:11:40 PM By: Thayer Dallas Entered By: Thayer Dallas on 10/30/2022 16:46:19 -------------------------------------------------------------------------------- Patient/Caregiver Education Details Patient Name: Date of Service: [name] 7/2/2024andnbsp11:30 A M Medical Record Number: [id number] Patient Account Number: [id number] Date of Birth/Gender: Treating RN: [birth year] (72 y.o. M) Primary Care Physician: Everrett Coombe Other Clinician: Thayer Dallas Referring Physician: Treating Physician/Extender: Scherrie Merritts in Treatment: 3 Education Assessment Education Provided To: Patient Education Topics Provided Electronic Signature(s) Signed: 10/30/2022 5:11:40 PM By: Thayer Dallas Entered By: Thayer Dallas on 10/30/2022 16:45:59 -------------------------------------------------------------------------------- Wound Assessment Details Patient Name: Date of  Service: [name], [name] 10/30/2022 11:30 A M Medical Record Number: [id number] Patient Account Number: [id number] Date of Birth/Sex: Treating RN: [birth year] (72 y.o. M) Primary Care Shadrack Brummitt: Everrett Coombe Other Clinician: Referring Cristina Ceniceros: Treating Samiah Ricklefs/Extender: Scherrie Merritts in Treatment: 3 Wound Status Wound Number: 3 Primary Etiology: Lymphedema Wound Location: Right, Lateral Lower Leg Wound Status: Open Wounding Event: Gradually Appeared Date Acquired: 09/02/2022 Weeks Of Treatment: 3 Clustered Wound: No Wound Measurements [name], [name] ([id number]) Length: (cm) 1 Width: (cm) 1 Depth: (cm) 0.1 Area: (cm) 0.785 Volume: (cm) 0.079 [id number] Page 3 of 4 % Reduction in Area: 92.4% % Reduction in Volume: 92.4% Wound Description Classification: Full Thickness Without Exposed Suppor Exudate Amount: Medium Exudate Type: Serosanguineous Exudate Color: red, brown t  Structures Periwound Skin Texture Texture Color No Abnormalities Noted: No No Abnormalities Noted: No Moisture No Abnormalities Noted: No Treatment Notes Wound #3 (Lower Leg) Wound Laterality: Right, Lateral Cleanser Soap and Water Discharge Instruction: May shower and wash wound with dial antibacterial soap and water prior to dressing change. Vashe 5.8 (oz) Discharge Instruction: or Cleanse the wound with Vashe prior to applying a clean dressing using gauze sponges, not tissue or cotton balls. Wound Cleanser Discharge Instruction: or Cleanse the wound with wound cleanser prior to applying a clean dressing using gauze sponges, not tissue or cotton balls. Peri-Wound Care Topical Primary Dressing Maxorb Extra Ag+ Alginate Dressing, 4x4.75 (in/in) Discharge Instruction: Apply to wound bed as instructed Secondary Dressing ABD Pad, 8x10 Discharge Instruction: Apply over primary dressing as directed. CarboFLEX Odor Control Dressing, 4x4 in Discharge  Instruction: Apply over primary dressing as directed. Woven Gauze Sponge, Non-Sterile 4x4 in Discharge Instruction: Apply over primary dressing as directed. Secured With Paper Tape, 2x10 (in/yd) Discharge Instruction: Secure dressing with tape as directed. Compression Wrap FourPress (4 layer compression wrap) Discharge Instruction: Apply four layer compression as directed. May also use Urgo K2 compression system as alternative. Stockinette Compression Stockings Facilities manager) Signed: 10/30/2022 5:11:40 PM By: Thayer Dallas Entered By: Thayer Dallas on 10/30/2022 11:48:20 Vitals Details -------------------------------------------------------------------------------- [name] ([id number]) [id number] Page 4 of 4 Patient Name: Date of Service: [name], [name] 10/30/2022 11:30 A M Medical Record Number: [id number] Patient Account Number: [id number] Date of Birth/Sex: Treating RN: 1950/09/10 (72 y.o. M) Primary Care Dnyla Antonetti: Everrett Coombe Other Clinician: Referring Herrick Hartog: Treating Shermaine Brigham/Extender: Scherrie Merritts in Treatment: 3 Vital Signs Time Taken: 11:48 Reference Range: 80 - 120 mg / dl Height (in): 68 Weight (lbs): 220 Body Mass Index (BMI): 33.4 Electronic Signature(s) Signed: 10/30/2022 5:11:40 PM By: Thayer Dallas Entered By: Thayer Dallas on 10/30/2022 11:48:11

## 2022-10-30 NOTE — Progress Notes (Signed)
YASSER, PUND ([id number]) [id number] Page 1 of 12 Visit Report for 10/04/2022 Chief Complaint Document Details Patient Name: Date of Service: [name], [name] 10/04/2022 8:00 A M Medical Record Number: [id number] Patient Account Number: [id number] Date of Birth/Sex: Treating RN: 1950/07/28 (72 y.o. M) Primary Care Provider: Everrett Coombe Other Clinician: Referring Provider: Treating Provider/Extender: Shawna Clamp in Treatment: 0 Information Obtained from: Patient Chief Complaint 10/04/2022; patient is here for review of multiple wounds Electronic Signature(s) Signed: 10/05/2022 7:57:57 AM By: Baltazar Najjar MD Entered By: Baltazar Najjar on 10/04/2022 10:19:52 -------------------------------------------------------------------------------- Debridement Details Patient Name: Date of Service: [name] 10/04/2022 8:00 A M Medical Record Number: [id number] Patient Account Number: [id number] Date of Birth/Sex: Treating RN: 03/31/1951 (72 y.o. [name] Primary Care Provider: Everrett Coombe Other Clinician: Referring Provider: Treating Provider/Extender: Shawna Clamp in Treatment: 0 Debridement Performed for Assessment: Wound #4 Right Gluteus Performed By: Clinician Karie Schwalbe, RN Debridement Type: Chemical/Enzymatic/Mechanical Agent Used: gauze and wound cleanser Level of Consciousness (Pre-procedure): Awake and Alert Pre-procedure Verification/Time Out Yes - 09:30 Taken: Start Time: 09:30 Pain Control: Lidocaine 4% Topical Solution Percent of Wound Bed Debrided: Instrument: Curette Bleeding: Minimum Hemostasis Achieved: Pressure End Time: 09:31 Procedural Pain: 0 Post Procedural Pain: 0 Response to Treatment: Procedure was tolerated well Level of Consciousness (Post- Awake and Alert procedure): Post Debridement Measurements of Total Wound Length: (cm) 2.5 Stage: Category/Stage II Width: (cm)  1.4 Depth: (cm) 0.1 Volume: (cm) 0.275 Character of Wound/Ulcer Post Debridement: Improved Post Procedure Diagnosis Same as Pre-procedure [name], [name] ([id number]) [id number] Page 2 of 12 Electronic Signature(s) Signed: 10/09/2022 5:34:30 PM By: Karie Schwalbe RN Signed: 10/30/2022 3:35:02 PM By: Baltazar Najjar MD Entered By: Karie Schwalbe on 10/09/2022 16:28:51 -------------------------------------------------------------------------------- Debridement Details Patient Name: Date of Service: [name], [name] 10/04/2022 8:00 A M Medical Record Number: [id number] Patient Account Number: [id number] Date of Birth/Sex: Treating RN: 1950/06/21 (72 y.o. [name] Primary Care Provider: Everrett Coombe Other Clinician: Referring Provider: Treating Provider/Extender: Shawna Clamp in Treatment: 0 Debridement Performed for Assessment: Wound #5 Left Gluteus Performed By: Clinician Karie Schwalbe, RN Debridement Type: Chemical/Enzymatic/Mechanical Agent Used: gauze and wound cleanser Level of Consciousness (Pre-procedure): Awake and Alert Pre-procedure Verification/Time Out Yes - 09:30 Taken: Start Time: 09:30 Pain Control: Lidocaine 4% Topical Solution Percent of Wound Bed Debrided: Instrument: Curette Bleeding: Minimum Hemostasis Achieved: Pressure End Time: 09:31 Procedural Pain: 0 Post Procedural Pain: 0 Response to Treatment: Procedure was tolerated well Level of Consciousness (Post- Awake and Alert procedure): Post Debridement Measurements of Total Wound Length: (cm) 2.9 Stage: Category/Stage II Width: (cm) 1.1 Depth: (cm) 0.1 Volume: (cm) 0.251 Character of Wound/Ulcer Post Debridement: Improved Post Procedure Diagnosis Same as Pre-procedure Electronic Signature(s) Signed: 10/09/2022 5:34:30 PM By: Karie Schwalbe RN Signed: 10/30/2022 3:35:02 PM By: Baltazar Najjar MD Entered By: Karie Schwalbe on 10/09/2022  16:29:29 -------------------------------------------------------------------------------- HPI Details Patient Name: Date of Service: [name] 10/04/2022 8:00 A M Medical Record Number: [id number] Patient Account Number: [id number] Date of Birth/Sex: Treating RN: 1950/11/24 (72 y.o. M) Primary Care Provider: Everrett Coombe Other Clinician: Referring Provider: Treating Provider/Extender: Shawna Clamp in Treatment: 0 [place], [place] ([id number]) [id number] Page 3 of 12 History of Present Illness HPI Description: ADMISSION 10/04/2022 This is a 72 year old man who is quite disabled because of some form of myopathy. He follows for this with neurology. He has had biopsies that showed necrosis without inflammation. At various times this is being labeled as polymyositis,  inclusion body myositis although I do not know that an exact label has been determined. In any case he is reasonably immobile at home. He has this apparatus to help him transfer. Because of difficulties getting him upstairs he has been sleeping in a recliner on the main floor. He also has a history of lymphedema and I see he was followed by PT for a period of time. He has a history of combined congestive heart failure but his last echo showed an EF of 60 to 65%. He has developed a wound on the right lateral leg over the last month or so. He also has a smaller wound on the right anterior lower leg. He is having problems with his left second toe and he has bilateral stage II ulcers over his ischial tuberosity. The patient is complaining of oral pain under his tongue. He has bilateral heel pain and increasing edema with weight gain over the past week or 2 Past medical history includes myopathy/myositis question inclusion body myositis, rheumatoid arthritis, coronary artery disease, massive pulmonary embolism, type 2 diabetes, lymphedema, combined congestive heart failure, bilateral foot  drop PTSD ABIs in our clinic were 1.22 on the right 1.27 on the left Electronic Signature(s) Signed: 10/05/2022 7:57:57 AM By: Baltazar Najjar MD Entered By: Baltazar Najjar on 10/04/2022 10:23:42 -------------------------------------------------------------------------------- Physical Exam Details Patient Name: Date of Service: [name] 10/04/2022 8:00 A M Medical Record Number: [id number] Patient Account Number: [id number] Date of Birth/Sex: Treating RN: [birth year] (72 y.o. M) Primary Care Provider: Everrett Coombe Other Clinician: Referring Provider: Treating Provider/Extender: Shawna Clamp in Treatment: 0 Constitutional Sitting or standing Blood Pressure is within target range for patient.. Pulse regular and within target range for patient.Marland Kitchen Respirations regular, non-labored and within target range.. Temperature is normal and within the target range for the patient.Marland Kitchen Appears in no distress. Ears, Nose, Mouth, and Throat His tongue is coated but this scrapes off easily. There are some plaques on the roof of his mouth that are more adherent. I did not see anything under his tongue. There is no evidence of inflammatory ulcers anywhere I could s. Respiratory work of breathing is normal. Bilateral breath sounds are clear and equal in all lobes with no wheezes, rales or rhonchi.. Cardiovascular Heart rhythm and rate regular, without murmur or gallop.JVP is elevated. Scant sacral edema.. Pedal pulses are palpable. Edema present in both extremities. He has the pockmarks suggestive of lymphedema on his lower legs however his edema is 3-4+ pitting extending up into his posterior thighs.. Gastrointestinal (GI) His abdomen is distended but there is no shifting dullness I could detect. Notes Wound exam; Bilateral stage II ischial tuberosity pressure ulcers. No debridement is required Right lateral and anterior lower leg clean wounds weeping fluid no debridement is  required On the left second toe this looks like a medial nail paronychia a rather than a wound Electronic Signature(s) Signed: 10/05/2022 7:57:57 AM By: Baltazar Najjar MD Entered By: Baltazar Najjar on 10/04/2022 10:28:59 [name] ([id number]) [id number] Page 4 of 12 -------------------------------------------------------------------------------- Physician Orders Details Patient Name: Date of Service: [name], [name] 10/04/2022 8:00 A M Medical Record Number: [id number] Patient Account Number: [id number] Date of Birth/Sex: Treating RN: 04-Jan-1951 (72 y.o. [name] Primary Care Provider: Everrett Coombe Other Clinician: Referring Provider: Treating Provider/Extender: Shawna Clamp in Treatment: 0 Verbal / Phone Orders: No Diagnosis Coding Follow-up Appointments ppointment in 1 week. - +++ Extra time Room 5/Hoyer ++++Dr. Hoffman/Dr. Leanord Hawking Return A Anesthetic (In  clinic) Topical Lidocaine 4% applied to wound bed - Used in Clinic Bathing/ Shower/ Hygiene May shower and wash wound with soap and water. - Please do not get right leg wet with compression wraps. Please keep wrap on all week. Can use Cast Protector-purchase at [employer] etc. Edema Control - Lymphedema / SCD / Other Bilateral Lower Extremities Elevate legs to the level of the heart or above for 30 minutes daily and/or when sitting for 3-4 times a day throughout the day. Avoid standing for long periods of time. Exercise regularly Moisturize legs daily. Other Edema Control Orders/Instructions: - Wear own compression garments on left leg . T off at night ake Off-Loading Other: - When sitting or lying, please prop pillows behind calves to float heels. Please keep of buttocks as much as possible. Needs Hospital bed/mattress- Run insurance--Dr Leanord Hawking will try and contact Primary PCP first Wound Treatment Wound #2 - Lower Leg Wound Laterality: Right,  Anterior Cleanser: Soap and Water 1 x Per Week/30 Days Discharge Instructions: May shower and wash wound with dial antibacterial soap and water prior to dressing change. Cleanser: Vashe 5.8 (oz) (DME) (Generic) 1 x Per Week/30 Days Discharge Instructions: or Cleanse the wound with Vashe prior to applying a clean dressing using gauze sponges, not tissue or cotton balls. Cleanser: Wound Cleanser (DME) (Generic) 1 x Per Week/30 Days Discharge Instructions: or Cleanse the wound with wound cleanser prior to applying a clean dressing using gauze sponges, not tissue or cotton balls. Prim Dressing: Maxorb Extra Ag+ Alginate Dressing, 4x4.75 (in/in) (DME) (Generic) 1 x Per Week/30 Days ary Discharge Instructions: Apply to wound bed as instructed Secondary Dressing: ABD Pad, 8x10 (DME) (Generic) 1 x Per Week/30 Days Discharge Instructions: Apply over primary dressing as directed. Secondary Dressing: Woven Gauze Sponge, Non-Sterile 4x4 in (DME) (Generic) 1 x Per Week/30 Days Discharge Instructions: Apply over primary dressing as directed. Secured With: Paper Tape, 2x10 (in/yd) 1 x Per Week/30 Days Discharge Instructions: Secure dressing with tape as directed. Compression Wrap: FourPress (4 layer compression wrap) 1 x Per Week/30 Days Discharge Instructions: Apply four layer compression as directed. May also use Urgo K2 compression system as alternative. Compression Wrap: Stockinette 1 x Per Week/30 Days Wound #3 - Lower Leg Wound Laterality: Right, Lateral Cleanser: Soap and Water 1 x Per Week/30 Days Discharge Instructions: May shower and wash wound with dial antibacterial soap and water prior to dressing change. Cleanser: Vashe 5.8 (oz) (DME) (Generic) 1 x Per Week/30 Days Discharge Instructions: or Cleanse the wound with Vashe prior to applying a clean dressing using gauze sponges, not tissue or cotton balls. Cleanser: Wound Cleanser (DME) (Generic) 1 x Per Week/30 Days Discharge Instructions: or  Cleanse the wound with wound cleanser prior to applying a clean dressing using gauze sponges, not tissue or cotton balls. [name], [name] ([id number]) [id number] Page 5 of 12 Prim Dressing: Maxorb Extra Ag+ Alginate Dressing, 4x4.75 (in/in) (DME) (Generic) 1 x Per Week/30 Days ary Discharge Instructions: Apply to wound bed as instructed Secondary Dressing: ABD Pad, 8x10 (DME) (Generic) 1 x Per Week/30 Days Discharge Instructions: Apply over primary dressing as directed. Secondary Dressing: Woven Gauze Sponge, Non-Sterile 4x4 in (DME) (Generic) 1 x Per Week/30 Days Discharge Instructions: Apply over primary dressing as directed. Secured With: Paper Tape, 2x10 (in/yd) 1 x Per Week/30 Days Discharge Instructions: Secure dressing with tape as directed. Compression Wrap: FourPress (4 layer compression wrap) 1 x Per Week/30 Days Discharge Instructions: Apply four layer compression as directed. May also use Urgo K2  compression system as alternative. Compression Wrap: Stockinette 1 x Per Week/30 Days Wound #4 - Gluteus Wound Laterality: Right Cleanser: Soap and Water 1 x Per Day/30 Days Discharge Instructions: May shower and wash wound with dial antibacterial soap and water prior to dressing change. Cleanser: Vashe 5.8 (oz) (DME) (Generic) 1 x Per Day/30 Days Discharge Instructions: or Cleanse the wound with Vashe prior to applying a clean dressing using gauze sponges, not tissue or cotton balls. Cleanser: Wound Cleanser (DME) (Generic) 1 x Per Day/30 Days Discharge Instructions: or Cleanse the wound with wound cleanser prior to applying a clean dressing using gauze sponges, not tissue or cotton balls. Prim Dressing: Maxorb Extra Calcium Alginate, 2x2 (in/in) (DME) (Generic) 1 x Per Day/30 Days ary Discharge Instructions: Apply to wound bed as instructed Secondary Dressing: Bordered Gauze, 2x2 in (DME) (Generic) 1 x Per Day/30 Days Discharge Instructions: Apply over primary  dressing as directed. Wound #5 - Gluteus Wound Laterality: Left Cleanser: Soap and Water 1 x Per Day/30 Days Discharge Instructions: May shower and wash wound with dial antibacterial soap and water prior to dressing change. Cleanser: Vashe 5.8 (oz) (DME) (Generic) 1 x Per Day/30 Days Discharge Instructions: or Cleanse the wound with Vashe prior to applying a clean dressing using gauze sponges, not tissue or cotton balls. Cleanser: Wound Cleanser (DME) (Generic) 1 x Per Day/30 Days Discharge Instructions: or Cleanse the wound with wound cleanser prior to applying a clean dressing using gauze sponges, not tissue or cotton balls. Prim Dressing: Maxorb Extra Calcium Alginate, 2x2 (in/in) (DME) (Generic) 1 x Per Day/30 Days ary Discharge Instructions: Apply to wound bed as instructed Secondary Dressing: Bordered Gauze, 2x2 in (DME) (Generic) 1 x Per Day/30 Days Discharge Instructions: Apply over primary dressing as directed. Patient Medications llergies: benazepril, gabapentin, Hyrocodone, Shellfish Containing Products A Notifications Medication Indication Start End oral candidiasis 10/04/2022 nystatin DOSE oral 100,000 unit/mL suspension - 5ml suspension oral four times daily Electronic Signature(s) Signed: 10/04/2022 10:37:45 AM By: Baltazar Najjar MD Entered By: Baltazar Najjar on 10/04/2022 10:37:44 -------------------------------------------------------------------------------- Problem List Details Patient Name: Date of Service: [name], [name] 10/04/2022 8:00 A Claudean Kinds, [name] ([id number]) [id number] Page 6 of 12 Medical Record Number: [id number] Patient Account Number: [id number] Date of Birth/Sex: Treating RN: [birth year] (72 y.o. M) Primary Care Provider: Everrett Coombe Other Clinician: Referring Provider: Treating Provider/Extender: Shawna Clamp in Treatment: 0 Active Problems ICD-10 Encounter Code Description Active Date  MDM Diagnosis [phone] Pressure ulcer of right buttock, stage 2 10/04/2022 No Yes L89.322 Pressure ulcer of left buttock, stage 2 10/04/2022 No Yes L97.818 Non-pressure chronic ulcer of other part of right lower leg with other specified 10/04/2022 No Yes severity I89.0 Lymphedema, not elsewhere classified 10/04/2022 No Yes B37.0 Candidal stomatitis 10/04/2022 No Yes 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, not elsewhere classified 10/04/2022 No Yes Inactive Problems Resolved Problems Electronic Signature(s) Signed: 10/05/2022 7:57:57 AM By: Baltazar Najjar MD Entered By: Baltazar Najjar on 10/04/2022 09:58:14 -------------------------------------------------------------------------------- Progress Note Details Patient Name: Date of Service: [name] 10/04/2022 8:00 A M Medical Record Number: [id number] Patient Account Number: [id number] Date of Birth/Sex: Treating RN: June 21, 1950 (71 y.o. M) Primary Care Provider: Everrett Coombe Other Clinician: Referring Provider: Treating Provider/Extender: Shawna Clamp in Treatment: 0 Subjective Chief Complaint Information obtained from Patient 10/04/2022; patient is here for review of multiple wounds History of Present Illness (HPI) ADMISSION 10/04/2022 This is a 72 year old man who is quite disabled because of some form of myopathy. He follows for  this with neurology. He has had biopsies that showed necrosis without inflammation. 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 In any case he is reasonably immobile at home. He has this apparatus to help him transfer. Because of difficulties getting him upstairs he [place], [place] ([id number]) [id number] Page 7 of 12 has been sleeping in a recliner on the main floor. He also has a history of lymphedema and I see he was followed by PT for a period of time. He has  a history of combined congestive heart failure but his last echo showed an EF of 60 to 65%. He has developed a wound on the right lateral leg over the last month or so. He also has a smaller wound on the right anterior lower leg. He is having problems with his left second toe and he has bilateral stage II ulcers over his ischial tuberosity. The patient is complaining of oral pain under his tongue. He has bilateral heel pain and increasing edema with weight gain over the past week or 2 Past medical history includes myopathy/myositis question inclusion body myositis, rheumatoid arthritis, coronary artery disease, massive pulmonary embolism, type 2 diabetes, lymphedema, combined congestive heart failure, bilateral foot drop PTSD ABIs in our clinic were 1.22 on the right 1.27 on the left Patient History Information obtained from Patient, Chart. Allergies benazepril, gabapentin, Hyrocodone, Shellfish Containing Products Social History Never smoker, Marital Status - Married, Alcohol Use - Never, Drug Use - No History, Caffeine Use - Daily - coffee. Medical History Hematologic/Lymphatic Patient has history of Anemia, Lymphedema - Bilateral Lower legs Cardiovascular Patient has history of Congestive Heart Failure, Deep Vein Thrombosis Endocrine Patient has history of Type II Diabetes Musculoskeletal Patient has history of Rheumatoid Arthritis Patient is treated with Insulin. Hospitalization/Surgery History - Biopsy left Shoulder; Angiogram Pulmonary Bilateral. Medical A Surgical History Notes nd Respiratory Hx: Pulmonary Embolism Gastrointestinal GERD Endocrine Hx: Hypothyroidism Genitourinary WU:[id number] Immunological AO:ZHYQMVHQIO disease Musculoskeletal Hx: Necrotizing Myopathy Psychiatric PTSD Objective Constitutional Sitting or standing Blood Pressure is within target range for patient.. Pulse regular and within target range for patient.Marland Kitchen Respirations regular, non-labored  and within target range.. Temperature is normal and within the target range for the patient.Marland Kitchen Appears in no distress. Vitals Time Taken: 8:12 AM, Height: 68 in, Source: Stated, Weight: 220 lbs, Source: Stated, BMI: 33.4, Temperature: 98.7 F, Pulse: 78 bpm, Respiratory Rate: 18 breaths/min, Blood Pressure: 147/80 mmHg. Ears, Nose, Mouth, and Throat  His tongue is coated but this scrapes off easily. There are some plaques on the roof of his mouth that are more adherent. I did not see anything under his tongue. There is no evidence of inflammatory ulcers anywhere I could s. Respiratory work of breathing is normal. Bilateral breath sounds are clear and equal in all lobes with no wheezes, rales or rhonchi.. Cardiovascular Heart rhythm and rate regular, without murmur or gallop.JVP is elevated. Scant sacral edema.. Pedal pulses are palpable. Edema present in both extremities. He has the pockmarks suggestive of lymphedema on his lower legs however his edema is 3-4+ pitting extending up into his posterior thighs.. Gastrointestinal (GI) His abdomen is distended but there is no shifting dullness I could detect. General Notes: Wound exam;  Bilateral stage II ischial tuberosity pressure ulcers. No debridement is required  Right lateral and anterior lower leg clean wounds weeping fluid no debridement is required  On the left second toe this looks like a  medial nail paronychia a rather than a wound [name], [name] ([id number]) [id number] Page 8 of 12 Integumentary (Hair, Skin) Wound #1 status is Healed - Epithelialized. Original cause of wound was Gradually Appeared. The date acquired was: 09/20/2022. The wound is located on the Left T Second. The wound measures 0cm length x 0cm width x 0cm depth; 0cm^2 area and 0cm^3 volume. There is no tunneling or undermining noted. There oe is a none present amount of drainage noted. There is no granulation within the wound bed. There is no  necrotic tissue within the wound bed. The periwound skin appearance had no abnormalities noted for texture. The periwound skin appearance had no abnormalities noted for moisture. The periwound skin appearance had no abnormalities noted for color. Periwound temperature was noted as No Abnormality. Wound #2 status is Open. Original cause of wound was Gradually Appeared. The date acquired was: 09/02/2022. The wound is located on the Right,Anterior Lower Leg. The wound measures 2.2cm length x 0.7cm width x 0.1cm depth; 1.21cm^2 area and 0.121cm^3 volume. There is Fat Layer (Subcutaneous Tissue) exposed. There is no tunneling or undermining noted. There is a medium amount of serous drainage noted. The wound margin is distinct with the outline attached to the wound base. There is large (67-100%) red granulation within the wound bed. There is a small (1-33%) amount of necrotic tissue within the wound bed including Adherent Slough. The periwound skin appearance had no abnormalities noted for texture. The periwound skin appearance had no abnormalities noted for moisture. The periwound skin appearance had no abnormalities noted for color. Periwound temperature was noted as No Abnormality. Wound #3 status is Open. Original cause of wound was Gradually Appeared. The date acquired was: 09/02/2022. The wound is located on the Right,Lateral Lower Leg. The wound measures 3.3cm length x 4cm width x 0.1cm depth; 10.367cm^2 area and 1.037cm^3 volume. There is no tunneling or undermining noted. There is a medium amount of serosanguineous drainage noted. There is large (67-100%) red granulation within the wound bed. There is a small (1-33%) amount of necrotic tissue within the wound bed including Adherent Slough. The periwound skin appearance had no abnormalities noted for texture. The periwound skin appearance had no abnormalities noted for moisture. The periwound skin appearance had no abnormalities noted for color. Periwound  temperature was noted as No Abnormality. Wound #4 status is Open. Original cause of wound was Gradually Appeared. The date acquired was: 09/02/2022. The wound is located on the Right Gluteus. The wound measures 2.5cm length x 1.4cm width x 0.1cm depth; 2.749cm^2 area and 0.275cm^3 volume. There is Fat Layer (Subcutaneous Tissue) exposed. There is no tunneling or undermining noted. There is a medium amount of serosanguineous drainage noted. There is medium (34-66%) red granulation within the wound bed. There is a medium (34-66%) amount of necrotic tissue within the wound bed including Adherent Slough. The periwound skin appearance had no abnormalities noted for texture. The periwound skin appearance had no abnormalities noted for moisture. The periwound skin appearance had no abnormalities noted for color. Periwound temperature was noted as No Abnormality. Wound #5 status is Open. Original cause of wound was Gradually Appeared. The date acquired was: 09/02/2022. The wound is located on the Left Gluteus. The wound measures 2.9cm length x 1.1cm width x 0.1cm depth; 2.505cm^2 area and 0.251cm^3 volume. There is Fat Layer (Subcutaneous Tissue) exposed. There is no tunneling or undermining noted. There is a medium amount of serosanguineous drainage noted. The wound margin is distinct with the outline attached to the  wound base. There is small (1-33%) pink granulation within the wound bed. There is a large (67-100%) amount of necrotic tissue within the wound bed including Adherent Slough. The periwound skin appearance had no abnormalities noted for texture. The periwound skin appearance had no abnormalities noted for moisture. The periwound skin appearance had no abnormalities noted for color. Periwound temperature was noted as No Abnormality. Assessment Active Problems ICD-10 Pressure ulcer of right buttock, stage 2 Pressure ulcer of left buttock, stage 2 Non-pressure chronic ulcer of other part of right  lower leg with other specified severity Lymphedema, not elsewhere classified Candidal stomatitis Other inflammatory and immune myopathies, not elsewhere classified Procedures Wound #4 Pre-procedure diagnosis of Wound #4 is a Pressure Ulcer located on the Right Gluteus . There was a Chemical/Enzymatic/Mechanical debridement performed by Karie Schwalbe, RN. With the following instrument(s): Curette after achieving pain control using Lidocaine 4% T opical Solution. Other agent used was gauze and wound cleanser. A time out was conducted at 09:30, prior to the start of the procedure. A Minimum amount of bleeding was controlled with Pressure. The procedure was tolerated well with a pain level of 0 throughout and a pain level of 0 following the procedure. Post Debridement Measurements: 2.5cm length x 1.4cm width x 0.1cm depth; 0.275cm^3 volume. Post debridement Stage noted as Category/Stage II. Character of Wound/Ulcer Post Debridement is improved. Post procedure Diagnosis Wound #4: Same as Pre-Procedure Wound #5 Pre-procedure diagnosis of Wound #5 is a Pressure Ulcer located on the Left Gluteus . There was a Chemical/Enzymatic/Mechanical debridement performed by Karie Schwalbe, RN. With the following instrument(s): Curette after achieving pain control using Lidocaine 4% T opical Solution. Other agent used was gauze and wound cleanser. A time out was conducted at 09:30, prior to the start of the procedure. A Minimum amount of bleeding was controlled with Pressure. The procedure was tolerated well with a pain level of 0 throughout and a pain level of 0 following the procedure. Post Debridement Measurements: 2.9cm length x 1.1cm width x 0.1cm depth; 0.251cm^3 volume. Post debridement Stage noted as Category/Stage II. Character of Wound/Ulcer Post Debridement is improved. Post procedure Diagnosis Wound #5: Same as Pre-Procedure Plan Follow-up Appointments: Return Appointment in 1 week. - +++ Extra  time Room 5/Hoyer ++++Dr. Hoffman/Dr. Leanord Hawking Anesthetic: (In clinic) Topical Lidocaine 4% applied to wound bed - Used in Clinic Bathing/ Shower/ Hygiene: [name], [name] ([id number]) [id number] Page 9 of 12 May shower and wash wound with soap and water. - Please do not get right leg wet with compression wraps. Please keep wrap on all week. Can use Cast Protector-purchase at [employer] etc. Edema Control - Lymphedema / SCD / Other: Elevate legs to the level of the heart or above for 30 minutes daily and/or when sitting for 3-4 times a day throughout the day. Avoid standing for long periods of time. Exercise regularly Moisturize legs daily. Other Edema Control Orders/Instructions: - Wear own compression garments on left leg . T off at night ake Off-Loading: Other: - When sitting or lying, please prop pillows behind calves to float heels. Please keep of buttocks as much as possible. Needs Hospital bed/mattress- Run insurance--Dr Leanord Hawking will try and contact Primary PCP first The following medication(s) was prescribed: nystatin oral 100,000 unit/mL suspension 5ml suspension oral four times daily for oral candidiasis starting 10/04/2022 WOUND #2: - Lower Leg Wound Laterality: Right, Anterior Cleanser: Soap and Water 1 x Per Week/30 Days Discharge Instructions: May shower and wash wound with dial antibacterial soap and water prior to  dressing change. Cleanser: Vashe 5.8 (oz) (DME) (Generic) 1 x Per Week/30 Days Discharge Instructions: or Cleanse the wound with Vashe prior to applying a clean dressing using gauze sponges, not tissue or cotton balls. Cleanser: Wound Cleanser (DME) (Generic) 1 x Per Week/30 Days Discharge Instructions: or Cleanse the wound with wound cleanser prior to applying a clean dressing using gauze sponges, not tissue or cotton balls. Prim Dressing: Maxorb Extra Ag+ Alginate Dressing, 4x4.75 (in/in) (DME) (Generic) 1 x Per Week/30 Days ary Discharge  Instructions: Apply to wound bed as instructed Secondary Dressing: ABD Pad, 8x10 (DME) (Generic) 1 x Per Week/30 Days Discharge Instructions: Apply over primary dressing as directed. Secondary Dressing: Woven Gauze Sponge, Non-Sterile 4x4 in (DME) (Generic) 1 x Per Week/30 Days Discharge Instructions: Apply over primary dressing as directed. Secured With: Paper T ape, 2x10 (in/yd) 1 x Per Week/30 Days Discharge Instructions: Secure dressing with tape as directed. Com pression Wrap: FourPress (4 layer compression wrap) 1 x Per Week/30 Days Discharge Instructions: Apply four layer compression as directed. May also use Urgo K2 compression system as alternative. Com pression Wrap: Stockinette 1 x Per Week/30 Days WOUND #3: - Lower Leg Wound Laterality: Right, Lateral Cleanser: Soap and Water 1 x Per Week/30 Days Discharge Instructions: May shower and wash wound with dial antibacterial soap and water prior to dressing change. Cleanser: Vashe 5.8 (oz) (DME) (Generic) 1 x Per Week/30 Days Discharge Instructions: or Cleanse the wound with Vashe prior to applying a clean dressing using gauze sponges, not tissue or cotton balls. Cleanser: Wound Cleanser (DME) (Generic) 1 x Per Week/30 Days Discharge Instructions: or Cleanse the wound with wound cleanser prior to applying a clean dressing using gauze sponges, not tissue or cotton balls. Prim Dressing: Maxorb Extra Ag+ Alginate Dressing, 4x4.75 (in/in) (DME) (Generic) 1 x Per Week/30 Days ary Discharge Instructions: Apply to wound bed as instructed Secondary Dressing: ABD Pad, 8x10 (DME) (Generic) 1 x Per Week/30 Days Discharge Instructions: Apply over primary dressing as directed. Secondary Dressing: Woven Gauze Sponge, Non-Sterile 4x4 in (DME) (Generic) 1 x Per Week/30 Days Discharge Instructions: Apply over primary dressing as directed. Secured With: Paper T ape, 2x10 (in/yd) 1 x Per Week/30 Days Discharge Instructions: Secure dressing with tape as  directed. Com pression Wrap: FourPress (4 layer compression wrap) 1 x Per Week/30 Days Discharge Instructions: Apply four layer compression as directed. May also use Urgo K2 compression system as alternative. Com pression Wrap: Stockinette 1 x Per Week/30 Days WOUND #4: - Gluteus Wound Laterality: Right Cleanser: Soap and Water 1 x Per Day/30 Days Discharge Instructions: May shower and wash wound with dial antibacterial soap and water prior to dressing change. Cleanser: Vashe 5.8 (oz) (DME) (Generic) 1 x Per Day/30 Days Discharge Instructions: or Cleanse the wound with Vashe prior to applying a clean dressing using gauze sponges, not tissue or cotton balls. Cleanser: Wound Cleanser (DME) (Generic) 1 x Per Day/30 Days Discharge Instructions: or Cleanse the wound with wound cleanser prior to applying a clean dressing using gauze sponges, not tissue or cotton balls. Prim Dressing: Maxorb Extra Calcium Alginate, 2x2 (in/in) (DME) (Generic) 1 x Per Day/30 Days ary Discharge Instructions: Apply to wound bed as instructed Secondary Dressing: Bordered Gauze, 2x2 in (DME) (Generic) 1 x Per Day/30 Days Discharge Instructions: Apply over primary dressing as directed. WOUND #5: - Gluteus Wound Laterality: Left Cleanser: Soap and Water 1 x Per Day/30 Days Discharge Instructions: May shower and wash wound with dial antibacterial soap and water prior to  dressing change. Cleanser: Vashe 5.8 (oz) (DME) (Generic) 1 x Per Day/30 Days Discharge Instructions: or Cleanse the wound with Vashe prior to applying a clean dressing using gauze sponges, not tissue or cotton balls. Cleanser: Wound Cleanser (DME) (Generic) 1 x Per Day/30 Days Discharge Instructions: or Cleanse the wound with wound cleanser prior to applying a clean dressing using gauze sponges, not tissue or cotton balls. Prim Dressing: Maxorb Extra Calcium Alginate, 2x2 (in/in) (DME) (Generic) 1 x Per Day/30 Days ary Discharge Instructions: Apply to  wound bed as instructed Secondary Dressing: Bordered Gauze, 2x2 in (DME) (Generic) 1 x Per Day/30 Days Discharge Instructions: Apply over primary dressing as directed. 1. This patient has bilateral stage II pressure ulcers. Will use silver alginate and a foam cover his wife can change these dressings. He will not be able to sleep in a recliner and I warned them that continued unrelieved pressure could result in a rapid deterioration to stage III or IV wounds. No infection in this area new 2. He has what looks like lymphedema and he has a history of this however 3-4+ pitting edema would suggest to me that there is some other fluid volume excess because of edema. He has known heart failure on torsemide 20 mg. I am not sure of his renal status. We applied silver alginate to the 2 wounds on the right leg under a 4-layer equivalent compression 3. I think the patient has some degree of oral thrush although I am not sure that that explains all of his symptoms. I will send him in an order for nystatin swish and swallow 4. 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 I will try to talk to his primary doctor about this 5. I will also talk to his primary doctor about the status of edema. I think he probably needs an increase in the torsemide but will definitely require some follow- up here there was no evidence of left-sided heart failure I could detect [name], [name] ([id number]) [id number] Page 10 of 12 Electronic Signature(s) Signed: 10/09/2022 5:34:30 PM By: Karie Schwalbe RN Signed: 10/30/2022 3:35:02 PM By: Baltazar Najjar MD Previous Signature: 10/05/2022 3:47:54 PM Version By: Shawn Stall RN, BSN Previous Signature: 10/05/2022 4:34:22 PM Version By: Baltazar Najjar MD Previous Signature: 10/05/2022 7:57:57 AM Version By: Baltazar Najjar MD Entered By: Karie Schwalbe on 10/09/2022 16:34:05 -------------------------------------------------------------------------------- HxROS Details Patient Name: Date of Service: [name], GURRIERI 10/04/2022 8:00 A M Medical Record Number: [id number] Patient Account Number: [id number] Date of Birth/Sex: Treating RN: January 30, 1951 (72 y.o. [name] Primary Care Provider: Everrett Coombe Other Clinician: Referring Provider: Treating Provider/Extender: Shawna Clamp in Treatment: 0 Information Obtained From Patient Chart Hematologic/Lymphatic Medical History: Positive for: Anemia; Lymphedema - Bilateral Lower legs Respiratory Medical History: Past Medical History Notes: Hx: Pulmonary Embolism Cardiovascular Medical History: Positive for: Congestive Heart Failure; Deep Vein Thrombosis Gastrointestinal Medical History: Past Medical History Notes: GERD Endocrine Medical History: Positive for: Type II Diabetes Past Medical History Notes: Hx: Hypothyroidism Time with diabetes: 33 years Treated with: Insulin Genitourinary Medical History: Past Medical History Notes: NW:GNFAOZHYQMVH Immunological Medical History: Past Medical History Notes: QI:ONGEXBMWUX disease Musculoskeletal Medical History: Positive for: Rheumatoid Arthritis Past Medical History Notes: Hx: Necrotizing Myopathy Ringwald, Antwaine ([id number]) [id number] Page 11 of 12 Psychiatric Medical History: Past Medical History Notes: PTSD Immunizations Pneumococcal Vaccine: Received Pneumococcal Vaccination: Yes Received Pneumococcal Vaccination On  or After 60th Birthday: No Implantable Devices None Hospitalization / Surgery History Type of Hospitalization/Surgery Biopsy left Shoulder; Angiogram Pulmonary Bilateral Family and Social History Never smoker; Marital Status - Married; Alcohol Use: Never; Drug Use: No History; Caffeine Use: Daily - coffee; Financial Concerns: No; Food,  Clothing or Shelter Needs: No; Support System Lacking: No; Transportation Concerns: No Electronic Signature(s) Signed: 10/04/2022 6:33:35 PM By: Karie Schwalbe RN Signed: 10/05/2022 7:57:57 AM By: Baltazar Najjar MD Entered By: Karie Schwalbe on 10/04/2022 09:25:21 -------------------------------------------------------------------------------- SuperBill Details Patient Name: Date of Service: LANGLEY, KROM 10/04/2022 Medical Record Number: [id number] Patient Account Number: [id number] Date of Birth/Sex: Treating RN: 1950-05-04 (72 y.o. M) Primary Care Provider: Everrett Coombe Other Clinician: Referring Provider: Treating Provider/Extender: Shawna Clamp in Treatment: 0 Diagnosis Coding ICD-10 Codes Code Description [phone] Pressure ulcer of right buttock, stage 2 L89.322 Pressure ulcer of left buttock, stage 2 L97.818 Non-pressure chronic ulcer of other part of right lower leg with other specified severity I89.0 Lymphedema, not elsewhere classified B37.0 Candidal stomatitis 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, not elsewhere classified Facility Procedures : CPT4 Code: [id number] Description: 99213 - WOUND CARE VISIT-LEV 3 EST PT Modifier: Quantity: 1 : CPT4 Code: [id number] Description: 78469 - DEBRIDE W/O ANES NON SELECT Modifier: Quantity: 1 Physician Procedures : CPT4 Code Description Modifier [id number] 13244 - WC PHYS LEVEL 4 - NEW PT ICD-10 Diagnosis Description L89.312 Pressure ulcer of right buttock, stage 2 L89.322 Pressure ulcer of left buttock, stage 2 L97.818 Non-pressure chronic ulcer of other part of  right lower leg with other specified severity I89.0 Lymphedema, not elsewhere classified Effertz, Akash ([id number]) [id number] Quantity: 1 f Page 12 of 12 Electronic Signature(s) Signed: 10/09/2022 5:34:30 PM By: Karie Schwalbe RN Signed: 10/30/2022 3:35:02 PM By: Baltazar Najjar MD Previous Signature: 10/04/2022  6:33:35 PM Version By: Karie Schwalbe RN Previous Signature: 10/05/2022 7:57:57 AM Version By: Baltazar Najjar MD Entered By: Karie Schwalbe on 10/09/2022 16:33:35

## 2022-10-31 NOTE — Progress Notes (Signed)
GURKARAN, SCHUCH ([id number]) [id number] Page 1 of 1 Visit Report for 10/30/2022 SuperBill Details Patient Name: Date of Service: [name], [name] 10/30/2022 Medical Record Number: [id number] Patient Account Number: [id number] Date of Birth/Sex: Treating RN: [birth year] (72 y.o. M) Primary Care Provider: Everrett Coombe Other Clinician: Referring Provider: Treating Provider/Extender: Scherrie Merritts in Treatment: 3 Diagnosis Coding ICD-10 Codes Code Description [phone] Pressure ulcer of right buttock, stage 2 L89.322 Pressure ulcer of left buttock, stage 2 L97.818 Non-pressure chronic ulcer of other part of right lower leg with other specified severity I89.0 Lymphedema, not elsewhere classified B37.0 Candidal stomatitis 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, not elsewhere classified Facility Procedures CPT4 Code Description Modifier Quantity [id number] (Facility Use Only) [phone] - APPLY MULTLAY COMPRS LWR RT LEG 1 Electronic Signature(s) Signed: 10/30/2022 5:11:40 PM By: Thayer Dallas Signed: 10/31/2022 7:55:03 AM By: Duanne Guess MD FACS Entered By: Thayer Dallas on 10/30/2022 16:46:38

## 2022-11-02 ENCOUNTER — Ambulatory Visit: Payer: Medicare Other

## 2022-11-05 ENCOUNTER — Encounter (HOSPITAL_BASED_OUTPATIENT_CLINIC_OR_DEPARTMENT_OTHER): Payer: Medicare Other | Admitting: Internal Medicine

## 2022-11-05 DIAGNOSIS — S81802A Unspecified open wound, left lower leg, initial encounter: Secondary | ICD-10-CM

## 2022-11-05 DIAGNOSIS — L89312 Pressure ulcer of right buttock, stage 2: Secondary | ICD-10-CM | POA: Diagnosis not present

## 2022-11-05 DIAGNOSIS — I87311 Chronic venous hypertension (idiopathic) with ulcer of right lower extremity: Secondary | ICD-10-CM | POA: Diagnosis not present

## 2022-11-05 DIAGNOSIS — I87312 Chronic venous hypertension (idiopathic) with ulcer of left lower extremity: Secondary | ICD-10-CM

## 2022-11-05 DIAGNOSIS — L89322 Pressure ulcer of left buttock, stage 2: Secondary | ICD-10-CM | POA: Diagnosis not present

## 2022-11-05 DIAGNOSIS — L97818 Non-pressure chronic ulcer of other part of right lower leg with other specified severity: Secondary | ICD-10-CM | POA: Diagnosis not present

## 2022-11-05 DIAGNOSIS — E11621 Type 2 diabetes mellitus with foot ulcer: Secondary | ICD-10-CM | POA: Diagnosis not present

## 2022-11-05 DIAGNOSIS — I890 Lymphedema, not elsewhere classified: Secondary | ICD-10-CM

## 2022-11-05 NOTE — Progress Notes (Signed)
KWAKU, DENNLER ([id number]) [id number] Page 1 of 8 Visit Report for 11/05/2022 Arrival Information Details Patient Name: Date of Service: [name], [name] 11/05/2022 2:30 PM Medical Record Number: [id number] Patient Account Number: [id number] Date of Birth/Sex: Treating RN: 1950/05/29 (72 y.o. [name], Yvonne Kendall Primary Care Gyneth Hubka: Everrett Coombe Other Clinician: Referring Garlan Drewes: Treating Hayven Fatima/Extender: Cheri Guppy in Treatment: 4 Visit Information History Since Last Visit Added or deleted any medications: No Patient Arrived: Wheel Chair Any new allergies or adverse reactions: No Arrival Time: 14:42 Had a fall or experienced change in No Accompanied By: family member activities of daily living that may affect Transfer Assistance: Manual risk of falls: Patient Identification Verified: Yes Signs or symptoms of abuse/neglect since last visito No Secondary Verification Process Completed: Yes Hospitalized since last visit: No Patient Requires Transmission-Based Precautions: No Implantable device outside of the clinic excluding No Patient Has Alerts: Yes cellular tissue based products placed in the center Patient Alerts: ELIQUIS since last visit: Has Dressing in Place as Prescribed: Yes Has Compression in Place as Prescribed: Yes Pain Present Now: No Electronic Signature(s) Signed: 11/05/2022 5:30:48 PM By: Shawn Stall RN, BSN Entered By: Shawn Stall on 11/05/2022 14:43:06 -------------------------------------------------------------------------------- Compression Therapy Details Patient Name: Date of Service: [name], [name] 11/05/2022 2:30 PM Medical Record Number: [id number] Patient Account Number: [id number] Date of Birth/Sex: Treating RN: [birth year] (72 y.o. Tammy Sours Primary Care Laquandra Carrillo: Everrett Coombe Other Clinician: Referring Meoshia Billing: Treating Rileigh Kawashima/Extender: Cheri Guppy in Treatment:  4 Compression Therapy Performed for Wound Assessment: Wound #6 Left,Anterior Lower Leg Performed By: Clinician Thayer Dallas, Compression Type: Double Layer Post Procedure Diagnosis Same as Pre-procedure Electronic Signature(s) Signed: 11/05/2022 5:30:48 PM By: Shawn Stall RN, BSN Entered By: Shawn Stall on 11/05/2022 14:57:00 Limehouse, [name] ([id number]) [id number] Page 2 of 8 -------------------------------------------------------------------------------- Lower Extremity Assessment Details Patient Name: Date of Service: [name], [name] 11/05/2022 2:30 PM Medical Record Number: [id number] Patient Account Number: [id number] Date of Birth/Sex: Treating RN: [birth year] (72 y.o. [name], Yvonne Kendall Primary Care Shyheim Tanney: Everrett Coombe Other Clinician: Referring Namiah Dunnavant: Treating Deidrick Rainey/Extender: Cheri Guppy in Treatment: 4 Edema Assessment Assessed: [name]: Yes] [name]: Yes] Edema: [Left: Yes] [Right: No] Calf Left: Right: Point of Measurement: 37 cm From Medial Instep 47 cm 40 cm Ankle Left: Right: Point of Measurement: 10 cm From Medial Instep 28 cm 25 cm Knee To Floor Left: Right: From Medial Instep 48 cm 48 cm Vascular Assessment Pulses: Dorsalis Pedis Palpable: [Left:Yes] [Right:Yes] Notes right thigh 62 left thigh 62 floor to thigh 63 Electronic Signature(s) Signed: 11/05/2022 5:30:48 PM By: Shawn Stall RN, BSN Entered By: Shawn Stall on 11/05/2022 14:58:07 -------------------------------------------------------------------------------- Multi Wound Chart Details Patient Name: Date of Service: [name] 11/05/2022 2:30 PM Medical Record Number: [id number] Patient Account Number: [id number] Date of Birth/Sex: Treating RN: 04-May-1950 (72 y.o. M) Primary Care Acelyn Basham: Everrett Coombe Other Clinician: Referring Chery Giusto: Treating Zionna Homewood/Extender: Cheri Guppy in Treatment: 4 Vital  Signs Height(in): 68 Pulse(bpm): 83 Weight(lbs): 220 Blood Pressure(mmHg): 107/57 Body Mass Index(BMI): 33.4 Temperature(F): 97.8 Respiratory Rate(breaths/min): 20 [3:Photos:] [6:No Photos] [N/A:N/A [id number] Page 3 of 8] Right, Lateral Lower Leg Left, Anterior Lower Leg N/A Wound Location: Gradually Appeared Blister N/A Wounding Event: Lymphedema Lymphedema N/A Primary Etiology: N/A Venous Leg Ulcer N/A Secondary Etiology: Anemia, Lymphedema, Congestive Anemia, Lymphedema, Congestive N/A Comorbid History: Heart Failure, Deep Vein Thrombosis, Heart Failure, Deep Vein Thrombosis, Type II Diabetes, Rheumatoid Arthritis Type II Diabetes, Rheumatoid Arthritis 09/02/2022 11/05/2022 N/A Date  Acquired: 4 0 N/A Weeks of Treatment: Open Open N/A Wound Status: No No N/A Wound Recurrence: 0x0x0 1x1x0.1 N/A Measurements L x W x D (cm) 0 0.785 N/A A (cm) : rea 0 0.079 N/A Volume (cm) : 100.00% N/A N/A % Reduction in A rea: 100.00% N/A N/A % Reduction in Volume: Full Thickness Without Exposed Partial Thickness N/A Classification: Support Structures None Present Medium N/A Exudate A mount: N/A Serosanguineous N/A Exudate Type: N/A red, brown N/A Exudate Color: Distinct, outline attached Distinct, outline attached N/A Wound Margin: None Present (0%) Large (67-100%) N/A Granulation A mount: N/A Pink, Pale N/A Granulation Quality: None Present (0%) None Present (0%) N/A Necrotic A mount: Fascia: No Fascia: No N/A Exposed Structures: Fat Layer (Subcutaneous Tissue): No Fat Layer (Subcutaneous Tissue): No Tendon: No Tendon: No Muscle: No Muscle: No Joint: No Joint: No Bone: No Bone: No Large (67-100%) Small (1-33%) N/A Epithelialization: N/A Debridement - Selective/Open Wound N/A Debridement: Pre-procedure Verification/Time Out N/A 14:50 N/A Taken: N/A Lidocaine 4% Topical Solution N/A Pain Control: N/A Skin/Epidermis N/A Level: N/A  0.78 N/A Debridement A (sq cm): rea N/A Curette N/A Instrument: N/A Minimum N/A Bleeding: N/A Pressure N/A Hemostasis A chieved: N/A 0 N/A Procedural Pain: N/A 0 N/A Post Procedural Pain: N/A Procedure was tolerated well N/A Debridement Treatment Response: N/A 1x1x0.1 N/A Post Debridement Measurements L x W x D (cm) N/A 0.079 N/A Post Debridement Volume: (cm) Excoriation: No Excoriation: No N/A Periwound Skin Texture: Induration: No Induration: No Callus: No Callus: No Crepitus: No Crepitus: No Rash: No Rash: No Scarring: No Scarring: No Maceration: No Maceration: No N/A Periwound Skin Moisture: Dry/Scaly: No Dry/Scaly: No Atrophie Blanche: No Atrophie Blanche: No N/A Periwound Skin Color: Cyanosis: No Cyanosis: No Ecchymosis: No Ecchymosis: No Erythema: No Erythema: No Hemosiderin Staining: No Hemosiderin Staining: No Mottled: No Mottled: No Pallor: No Pallor: No Rubor: No Rubor: No N/A Compression Therapy N/A Procedures Performed: Debridement Treatment Notes Electronic Signature(s) Signed: 11/05/2022 4:36:37 PM By: Geralyn Corwin DO Entered By: Geralyn Corwin on 11/05/2022 15:05:49 Skilton, Netanel ([id number]) [id number] Page 4 of 8 -------------------------------------------------------------------------------- Multi-Disciplinary Care Plan Details Patient Name: Date of Service: [name], [name] 11/05/2022 2:30 PM Medical Record Number: [id number] Patient Account Number: [id number] Date of Birth/Sex: Treating RN: 08/07/1950 (72 y.o. Tammy Sours Primary Care Cathryn Gallery: Everrett Coombe Other Clinician: Referring Grier Czerwinski: Treating Aadvika Konen/Extender: Cheri Guppy in Treatment: 4 Active Inactive Wound/Skin Impairment Nursing Diagnoses: Impaired tissue integrity Goals: Patient/caregiver will verbalize understanding of skin care regimen Date Initiated: 10/04/2022 Target Resolution Date:  12/29/2022 Goal Status: Active Interventions: Assess ulceration(s) every visit Treatment Activities: Skin care regimen initiated : 10/04/2022 Notes: Electronic Signature(s) Signed: 11/05/2022 5:30:48 PM By: Shawn Stall RN, BSN Entered By: Shawn Stall on 11/05/2022 14:52:37 -------------------------------------------------------------------------------- Pain Assessment Details Patient Name: Date of Service: [name], [name] 11/05/2022 2:30 PM Medical Record Number: [id number] Patient Account Number: [id number] Date of Birth/Sex: Treating RN: [birth year] (72 y.o. Tammy Sours Primary Care Makai Dumond: Everrett Coombe Other Clinician: Referring Ian Cavey: Treating Miette Molenda/Extender: Cheri Guppy in Treatment: 4 Active Problems Location of Pain Severity and Description of Pain Patient Has Paino No Site Locations [place], [place] ([id number]) [id number] Page 5 of 8 Pain Management and Medication Current Pain Management: Electronic Signature(s) Signed: 11/05/2022 5:30:48 PM By: Shawn Stall RN, BSN Entered By: Shawn Stall on 11/05/2022 14:43:30 -------------------------------------------------------------------------------- Patient/Caregiver Education Details Patient Name: Date of Service: KEUNDRE, BARELA 7/8/2024andnbsp2:30 PM Medical Record Number: [id number] Patient Account Number: [id number] Date of Birth/Gender:  Treating RN: [birth year] (72 y.o. Tammy Sours Primary Care Physician: Everrett Coombe Other Clinician: Referring Physician: Treating Physician/Extender: Cheri Guppy in Treatment: 4 Education Assessment Education Provided To: Patient Education Topics Provided Wound/Skin Impairment: Handouts: Caring for Your Ulcer Methods: Explain/Verbal Responses: Reinforcements needed Electronic Signature(s) Signed: 11/05/2022 5:30:48 PM By: Shawn Stall RN, BSN Entered By: Shawn Stall on 11/05/2022  14:52:52 -------------------------------------------------------------------------------- Wound Assessment Details Patient Name: Date of Service: [name], [name] 11/05/2022 2:30 PM Medical Record Number: [id number] Patient Account Number: [id number] Date of Birth/Sex: Treating RN: 1950/12/10 (72 y.o. Tammy Sours Primary Care Collier Monica: Everrett Coombe Other Clinician: [name] ([id number]) [id number] Page 6 of 8 Referring Destyn Parfitt: Treating Lilyauna Miedema/Extender: Cheri Guppy in Treatment: 4 Wound Status Wound Number: 3 Primary Lymphedema Etiology: Wound Location: Right, Lateral Lower Leg Wound Open Wounding Event: Gradually Appeared Status: Date Acquired: 09/02/2022 Comorbid Anemia, Lymphedema, Congestive Heart Failure, Deep Vein Weeks Of Treatment: 4 History: Thrombosis, Type II Diabetes, Rheumatoid Arthritis Clustered Wound: No Photos Wound Measurements Length: (cm) Width: (cm) Depth: (cm) Area: (cm) Volume: (cm) 0 % Reduction in Area: 100% 0 % Reduction in Volume: 100% 0 Epithelialization: Large (67-100%) 0 Tunneling: No 0 Undermining: No Wound Description Classification: Full Thickness Without Exposed Suppor Wound Margin: Distinct, outline attached Exudate Amount: None Present t Structures Foul Odor After Cleansing: No Slough/Fibrino No Wound Bed Granulation Amount: None Present (0%) Exposed Structure Necrotic Amount: None Present (0%) Fascia Exposed: No Fat Layer (Subcutaneous Tissue) Exposed: No Tendon Exposed: No Muscle Exposed: No Joint Exposed: No Bone Exposed: No Periwound Skin Texture Texture Color No Abnormalities Noted: No No Abnormalities Noted: No Callus: No Atrophie Blanche: No Crepitus: No Cyanosis: No Excoriation: No Ecchymosis: No Induration: No Erythema: No Rash: No Hemosiderin Staining: No Scarring: No Mottled: No Pallor: No Moisture Rubor: No No Abnormalities Noted: No Dry /  Scaly: No Maceration: No Electronic Signature(s) Signed: 11/05/2022 5:30:48 PM By: Shawn Stall RN, BSN Entered By: Shawn Stall on 11/05/2022 14:44:34 Goleman, Marisa ([id number]) [id number] Page 7 of 8 -------------------------------------------------------------------------------- Wound Assessment Details Patient Name: Date of Service: [name], [name] 11/05/2022 2:30 PM Medical Record Number: [id number] Patient Account Number: [id number] Date of Birth/Sex: Treating RN: 08/27/1950 (72 y.o. Tammy Sours Primary Care Miyana Mordecai: Everrett Coombe Other Clinician: Referring Maevyn Riordan: Treating Amr Sturtevant/Extender: Cheri Guppy in Treatment: 4 Wound Status Wound Number: 6 Primary Lymphedema Etiology: Wound Location: Left, Anterior Lower Leg Secondary Venous Leg Ulcer Wounding Event: Blister Etiology: Date Acquired: 11/05/2022 Wound Open Weeks Of Treatment: 0 Status: Clustered Wound: No Comorbid Anemia, Lymphedema, Congestive Heart Failure, Deep Vein History: Thrombosis, Type II Diabetes, Rheumatoid Arthritis Wound Measurements Length: (cm) 1 Width: (cm) 1 Depth: (cm) 0.1 Area: (cm) 0.785 Volume: (cm) 0.079 % Reduction in Area: % Reduction in Volume: Epithelialization: Small (1-33%) Tunneling: No Undermining: No Wound Description Classification: Partial Thickness Wound Margin: Distinct, outline attached Exudate Amount: Medium Exudate Type: Serosanguineous Exudate Color: red, brown Foul Odor After Cleansing: No Slough/Fibrino No Wound Bed Granulation Amount: Large (67-100%) Exposed Structure Granulation Quality: Pink, Pale Fascia Exposed: No Necrotic Amount: None Present (0%) Fat Layer (Subcutaneous Tissue) Exposed: No Tendon Exposed: No Muscle Exposed: No Joint Exposed: No Bone Exposed: No Periwound Skin Texture Texture Color No Abnormalities Noted: No No Abnormalities Noted: No Callus: No Atrophie Blanche:  No Crepitus: No Cyanosis: No Excoriation: No Ecchymosis: No Induration: No Erythema: No Rash: No Hemosiderin Staining: No Scarring: No Mottled: No Pallor: No Moisture Rubor: No No Abnormalities Noted: No  Dry / Scaly: No Maceration: No Electronic Signature(s) Signed: 11/05/2022 5:30:48 PM By: Shawn Stall RN, BSN Entered By: Shawn Stall on 11/05/2022 14:56:00 -------------------------------------------------------------------------------- Vitals Details Patient Name: Date of Service: [name] 11/05/2022 2:30 PM Medical Record Number: [id number] Patient Account Number: [id number] Date of Birth/Sex: Treating RN: [birth year] (72 y.o. Tammy Sours Primary Care Hosanna Betley: Everrett Coombe Other Clinician: [name] ([id number]) [id number] Page 8 of 8 Referring Wilburt Messina: Treating Jacoby Ritsema/Extender: Cheri Guppy in Treatment: 4 Vital Signs Time Taken: 14:40 Temperature (F): 97.8 Height (in): 68 Pulse (bpm): 83 Weight (lbs): 220 Respiratory Rate (breaths/min): 20 Body Mass Index (BMI): 33.4 Blood Pressure (mmHg): 107/57 Reference Range: 80 - 120 mg / dl Electronic Signature(s) Signed: 11/05/2022 5:30:48 PM By: Shawn Stall RN, BSN Entered By: Shawn Stall on 11/05/2022 14:43:25

## 2022-11-05 NOTE — Progress Notes (Signed)
ZO, DINOVO ([id number]) [id number] Page 1 of 9 Visit Report for 11/05/2022 Chief Complaint Document Details Patient Name: Date of Service: [name], [name] 11/05/2022 2:30 PM Medical Record Number: [id number] Patient Account Number: [id number] Date of Birth/Sex: Treating RN: 11-26-1950 (72 y.o. M) Primary Care Provider: Everrett [name] Other Clinician: Referring Provider: Treating Provider/Extender: [name] in Treatment: 4 Information Obtained from: Patient Chief Complaint 10/04/2022; patient is here for review of multiple wounds Electronic Signature(s) Signed: 11/05/2022 4:36:37 PM By: [name] Corwin DO Entered By: [name] on 11/05/2022 15:05:56 -------------------------------------------------------------------------------- Debridement Details Patient Name: Date of Service: [name], [name] 11/05/2022 2:30 PM Medical Record Number: [id number] Patient Account Number: [id number] Date of Birth/Sex: Treating RN: 03-Sep-1950 (72 y.o. [name], [name] Primary Care Provider: Everrett [name] Other Clinician: Referring Provider: Treating Provider/Extender: [name] in Treatment: 4 Debridement Performed for Assessment: Wound #6 Left,Anterior Lower Leg Performed By: Physician [name] Corwin, DO Debridement Type: Debridement Severity of Tissue Pre Debridement: Limited to breakdown of skin Level of Consciousness (Pre-procedure): Awake and Alert Pre-procedure Verification/Time Out Yes - 14:50 Taken: Start Time: 14:51 Pain Control: Lidocaine 4% T opical Solution Percent of Wound Bed Debrided: 100% T Area Debrided (cm): otal 0.78 Tissue and other material debrided: Viable, Skin: Dermis , Skin: Epidermis Level: Skin/Epidermis Debridement Description: Selective/Open Wound Instrument: Curette Bleeding: Minimum Hemostasis Achieved: Pressure End Time: 14:56 Procedural Pain: 0 Post Procedural Pain:  0 Response to Treatment: Procedure was tolerated well Level of Consciousness (Post- Awake and Alert procedure): Post Debridement Measurements of Total Wound Length: (cm) 1 Width: (cm) 1 Depth: (cm) 0.1 Volume: (cm) 0.079 Character of Wound/Ulcer Post Debridement: Improved [name], [name] ([id number]) [id number] Page 2 of 9 Severity of Tissue Post Debridement: Fat layer exposed Post Procedure Diagnosis Same as Pre-procedure Electronic Signature(s) Signed: 11/05/2022 4:36:37 PM By: [name] Corwin DO Signed: 11/05/2022 5:30:48 PM By: [name] Stall RN, BSN Entered By: [name] on 11/05/2022 14:56:45 -------------------------------------------------------------------------------- HPI Details Patient Name: Date of Service: [name], [name] 11/05/2022 2:30 PM Medical Record Number: [id number] Patient Account Number: [id number] Date of Birth/Sex: Treating RN: 20-Jul-1950 (73 y.o. M) Primary Care Provider: Everrett [name] Other Clinician: Referring Provider: Treating Provider/Extender: [name] in Treatment: 4 History of Present Illness HPI Description: ADMISSION 10/04/2022 This is a 72 year old man who is quite disabled because of some form of myopathy. He follows for this with neurology. He has had biopsies that showed necrosis without inflammation. 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 In any case he is reasonably immobile at home. He has this apparatus to help him transfer. Because of difficulties getting him upstairs he has been sleeping in a recliner on the main floor. He also has a history of lymphedema and I see he was followed by PT for a period of time. He has a history of combined congestive heart failure but his last echo showed an EF of 60 to 65%. He has developed a wound on the right lateral leg over the last month or so. He  also has a smaller wound on the right anterior lower leg. He is having problems with his left second toe and he has bilateral stage II ulcers over his ischial tuberosity. The patient is complaining of oral pain under his tongue. He has bilateral heel pain and increasing edema with weight gain over the past week or 2 Past medical history includes myopathy/myositis  question inclusion body myositis, rheumatoid arthritis, coronary artery disease, massive pulmonary embolism, type 2 diabetes, lymphedema, combined congestive heart failure, bilateral foot drop PTSD ABIs in our clinic were 1.22 on the right 1.27 on the left 6/17; patient presents for follow-up. He has been using silver alginate to the bilateral buttocks wounds. These have healed. We have also been using silver alginate to the right lower extremity under compression therapy. He is tolerated this well. The wounds are smaller. 6/25; patient presents for follow-up. We have been using silver alginate to the right lower extremity under compression therapy. The anterior leg wound has healed. The lateral right leg wound is smaller. 7/8; patient presents for follow-up. We have been using silver alginate under compression therapy to the right lower extremity wound and this is healed. Unfortunately he has developed a blister to the left lower extremity. Electronic Signature(s) Signed: 11/05/2022 4:36:37 PM By: [name] Corwin DO Entered By: [name] on 11/05/2022 15:08:09 -------------------------------------------------------------------------------- Physical Exam Details Patient Name: Date of Service: [name], [name] 11/05/2022 2:30 PM Medical Record Number: [id number] Patient Account Number: [id number] Date of Birth/Sex: Treating RN: 1950/12/25 (72 y.o. M) Primary Care Provider: Everrett [name] Other Clinician: Referring Provider: Treating Provider/Extender: [name] in Treatment: 4 [name], [name] ([id number])  [id number] Page 3 of 9 Constitutional respirations regular, non-labored and within target range for patient.. Cardiovascular 2+ dorsalis pedis/posterior tibialis pulses. Psychiatric pleasant and cooperative. Notes Right lower extremity: T the lateral aspect there is epithelization to the previous wound site. 2+ pitting edema to the left thigh. o Left lower extremity: T the anterior aspect there is a small blister and when debrided there is granulation tissue. No signs of infection. 3+ pitting edema to the o left thigh. Electronic Signature(s) Signed: 11/05/2022 4:36:37 PM By: [name] Corwin DO Entered By: [name] on 11/05/2022 15:09:53 -------------------------------------------------------------------------------- Physician Orders Details Patient Name: Date of Service: [name], [name] 11/05/2022 2:30 PM Medical Record Number: [id number] Patient Account Number: [id number] Date of Birth/Sex: Treating RN: 1951/01/28 (72 y.o. [name], [name] Primary Care Provider: Everrett [name] Other Clinician: Referring Provider: Treating Provider/Extender: [name] in Treatment: 4 Verbal / Phone Orders: No Diagnosis Coding ICD-10 Coding Code Description [phone] Pressure ulcer of right buttock, stage 2 L89.322 Pressure ulcer of left buttock, stage 2 L97.818 Non-pressure chronic ulcer of other part of right lower leg with other specified severity I89.0 Lymphedema, not elsewhere classified B37.0 Candidal stomatitis 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 - Dr. Mikey Bussing 11/12/2022 3pm room 8 Return A Other: - [facility] will send out the thigh high compression garments. Anesthetic (In clinic) Topical Lidocaine 4% applied to wound bed - Used in Clinic Bathing/ Shower/ Hygiene May shower and wash wound with soap and water. - Please do not get right leg wet with  compression wraps. Please keep wrap on all week. Can use Cast Protector-purchase at [employer] etc. Edema Control - Lymphedema / SCD / Other Bilateral Lower Extremities Elevate legs to the level of the heart or above for 30 minutes daily and/or when sitting for 3-4 times a day throughout the day. Avoid standing for long periods of time. Exercise regularly Moisturize legs daily. Compression stocking or Garment 30-40 mm/Hg pressure to: - thigh high compression stockings- sippers if available; qty 3 for both legs every 6 months for life. Other Edema Control Orders/Instructions: - tubigrip to right leg size E double layer wear until you arrive, remove and then place  your compression stocking. If compression wraps slide down please call wound center and speak with a nurse. Wound Treatment Wound #6 - Lower Leg Wound Laterality: Left, Anterior Cleanser: Soap and Water 1 x Per Week/30 Days Discharge Instructions: May shower and wash wound with dial antibacterial soap and water prior to dressing change. IVAL, PHEGLEY ([id number]) [id number] Page 4 of 9 Cleanser: Vashe 5.8 (oz) 1 x Per Week/30 Days Discharge Instructions: Cleanse the wound with Vashe prior to applying a clean dressing using gauze sponges, not tissue or cotton balls. Prim Dressing: Maxorb Extra Ag+ Alginate Dressing, 2x2 (in/in) 1 x Per Week/30 Days ary Discharge Instructions: Apply to wound bed as instructed Secondary Dressing: CarboFLEX Odor Control Dressing, 4x4 in 1 x Per Week/30 Days Discharge Instructions: Apply over primary dressing as directed. Secondary Dressing: Woven Gauze Sponge, Non-Sterile 4x4 in 1 x Per Week/30 Days Discharge Instructions: Apply over primary dressing as directed. Compression Wrap: Urgo K2, (equivalent to a 4 layer) two layer compression system, regular 1 x Per Week/30 Days Discharge Instructions: Apply Urgo K2 as directed (alternative to 4 layer compression). Electronic  Signature(s) Signed: 11/05/2022 4:36:37 PM By: [name] Corwin DO Entered By: [name] on 11/05/2022 15:10:02 -------------------------------------------------------------------------------- Problem List Details Patient Name: Date of Service: [name], [name] 11/05/2022 2:30 PM Medical Record Number: [id number] Patient Account Number: [id number] Date of Birth/Sex: Treating RN: 1951/01/25 (72 y.o. [name], [name] Primary Care Provider: Everrett [name] Other Clinician: Referring Provider: Treating Provider/Extender: [name] in Treatment: 4 Active Problems ICD-10 Encounter Code Description Active Date MDM Diagnosis S81.802A Unspecified open wound, left lower leg, initial encounter 11/05/2022 No Yes I87.312 Chronic venous hypertension (idiopathic) with ulcer of left lower extremity 11/05/2022 No Yes L97.818 Non-pressure chronic ulcer of other part of right lower leg with other specified 10/04/2022 No Yes severity I89.0 Lymphedema, not elsewhere classified 10/04/2022 No Yes 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, not elsewhere classified 10/04/2022 No Yes Inactive Problems Resolved Problems ICD-10 Code Description Active Date Resolved Date L89.312 Pressure ulcer of right buttock, stage 2 10/04/2022 10/04/2022 [name] ([id number]) [id number] Page 5 of 9 [id number] Pressure ulcer of left buttock, stage 2 10/04/2022 10/04/2022 B37.0 Candidal stomatitis 10/04/2022 10/04/2022 Electronic Signature(s) Signed: 11/05/2022 4:36:37 PM By: [name] Corwin DO Entered By: [name] on 11/05/2022 15:13:08 -------------------------------------------------------------------------------- Progress Note Details Patient Name: Date of Service: [name], [name] 11/05/2022 2:30 PM Medical Record Number: [id number] Patient Account Number: [id number] Date of Birth/Sex: Treating RN: May 18, 1950 (72 y.o. M) Primary Care Provider: Everrett [name] Other  Clinician: Referring Provider: Treating Provider/Extender: [name] in Treatment: 4 Subjective Chief Complaint Information obtained from Patient 10/04/2022; patient is here for review of multiple wounds History of Present Illness (HPI) ADMISSION 10/04/2022 This is a 72 year old man who is quite disabled because of some form of myopathy. He follows for this with neurology. He has had biopsies that showed necrosis without inflammation. 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 In any case he is reasonably immobile at home. He has this apparatus to help him transfer. Because of difficulties getting him upstairs he has been sleeping in a recliner on the main floor. He also has a history of lymphedema and I see he was followed by PT for a period of time. He has a history of combined congestive heart failure but his last echo showed an EF of 60 to 65%. He has developed  a wound on the right lateral leg over the last month or so. He also has a smaller wound on the right anterior lower leg. He is having problems with his left second toe and he has bilateral stage II ulcers over his ischial tuberosity. The patient is complaining of oral pain under his tongue. He has bilateral heel pain and increasing edema with weight gain over the past week or 2 Past medical history includes myopathy/myositis question inclusion body myositis, rheumatoid arthritis, coronary artery disease, massive pulmonary embolism, type 2 diabetes, lymphedema, combined congestive heart failure, bilateral foot drop PTSD ABIs in our clinic were 1.22 on the right 1.27 on the left 6/17; patient presents for follow-up. He has been using silver alginate to the bilateral buttocks wounds. These have healed. We have also been using silver alginate to the right lower extremity under compression therapy. He is tolerated  this well. The wounds are smaller. 6/25; patient presents for follow-up. We have been using silver alginate to the right lower extremity under compression therapy. The anterior leg wound has healed. The lateral right leg wound is smaller. 7/8; patient presents for follow-up. We have been using silver alginate under compression therapy to the right lower extremity wound and this is healed. Unfortunately he has developed a blister to the left lower extremity. Patient History Information obtained from Patient, Chart. Social History Never smoker, Marital Status - Married, Alcohol Use - Never, Drug Use - No History, Caffeine Use - Daily - coffee. Medical History Hematologic/Lymphatic Patient has history of Anemia, Lymphedema - Bilateral Lower legs Cardiovascular Patient has history of Congestive Heart Failure, Deep Vein Thrombosis Endocrine Patient has history of Type II Diabetes Musculoskeletal Patient has history of Rheumatoid Arthritis Hospitalization/Surgery History - Biopsy left Shoulder; Angiogram Pulmonary Bilateral. Medical A Surgical History Notes nd [name], [name] ([id number]) [id number] Page 6 of 9 Respiratory Hx: Pulmonary Embolism Gastrointestinal GERD Endocrine Hx: Hypothyroidism Genitourinary WU:[id number] Immunological AO:ZHYQMVHQIO disease Musculoskeletal Hx: Necrotizing Myopathy Psychiatric PTSD Objective Constitutional respirations regular, non-labored and within target range for patient.. Vitals Time Taken: 2:40 PM, Height: 68 in, Weight: 220 lbs, BMI: 33.4, Temperature: 97.8 F, Pulse: 83 bpm, Respiratory Rate: 20 breaths/min, Blood Pressure: 107/57 mmHg. Cardiovascular 2+ dorsalis pedis/posterior tibialis pulses. Psychiatric pleasant and cooperative. General Notes: Right lower extremity: T the lateral aspect there is epithelization to the previous wound site. 2+ pitting edema to the left thigh. Left lower o extremity: T the  anterior aspect there is a small blister and when debrided there is granulation tissue. No signs of infection. 3+ pitting edema to the left thigh. o Integumentary (Hair, Skin) Wound #3 status is Open. Original cause of wound was Gradually Appeared. The date acquired was: 09/02/2022. The wound has been in treatment 4 weeks. The wound is located on the Right,Lateral Lower Leg. The wound measures 0cm length x 0cm width x 0cm depth; 0cm^2 area and 0cm^3 volume. There is no tunneling or undermining noted. There is a none present amount of drainage noted. The wound margin is distinct with the outline attached to the wound base. There is no granulation within the wound bed. There is no necrotic tissue within the wound bed. The periwound skin appearance did not exhibit: Callus, Crepitus, Excoriation, Induration, Rash, Scarring, Dry/Scaly, Maceration, Atrophie Blanche, Cyanosis, Ecchymosis, Hemosiderin Staining, Mottled, Pallor, Rubor, Erythema. Wound #6 status is Open. Original cause of wound was Blister. The date acquired was: 11/05/2022. The wound is located on the Left,Anterior Lower Leg. The wound measures 1cm length x 1cm width x  0.1cm depth; 0.785cm^2 area and 0.079cm^3 volume. There is no tunneling or undermining noted. There is a medium amount of serosanguineous drainage noted. The wound margin is distinct with the outline attached to the wound base. There is large (67-100%) pink, pale granulation within the wound bed. There is no necrotic tissue within the wound bed. The periwound skin appearance did not exhibit: Callus, Crepitus, Excoriation, Induration, Rash, Scarring, Dry/Scaly, Maceration, Atrophie Blanche, Cyanosis, Ecchymosis, Hemosiderin Staining, Mottled, Pallor, Rubor, Erythema. Assessment Active Problems ICD-10 Unspecified open wound, left lower leg, initial encounter Chronic venous hypertension (idiopathic) with ulcer of left lower extremity Non-pressure chronic ulcer of other part of  right lower leg with other specified severity Lymphedema, not elsewhere classified Other inflammatory and immune myopathies, not elsewhere classified Patient has done well with silver alginate under compression therapy to the right lower extremity. His wounds have healed. Unfortunately has developed a blister to the left lower extremity despite wearing compression garments. This was debrided and I recommended silver alginate under 4-layer compression here. ABI on the left was 1.29 with palpable pedal pulses suggesting adequate blood flow for healing. We will obtain leg measurements today and order thigh- high compression stockings. He knows to call with any questions or concerns. Follow-up in 1 week. Procedures Wound #6 Pre-procedure diagnosis of Wound #6 is a Lymphedema located on the Left,Anterior Lower Leg .Severity of Tissue Pre Debridement is: Limited to breakdown of skin. There was a Selective/Open Wound Skin/Epidermis Debridement with a total area of 0.78 sq cm performed by [name] Corwin, DO. With the Sperry, DAYQUAN ([id number]) [id number] Page 7 of 9 following instrument(s): Curette to remove Viable tissue/material. Material removed includes Skin: Dermis and Skin: Epidermis and after achieving pain control using Lidocaine 4% T opical Solution. A time out was conducted at 14:50, prior to the start of the procedure. A Minimum amount of bleeding was controlled with Pressure. The procedure was tolerated well with a pain level of 0 throughout and a pain level of 0 following the procedure. Post Debridement Measurements: 1cm length x 1cm width x 0.1cm depth; 0.079cm^3 volume. Character of Wound/Ulcer Post Debridement is improved. Severity of Tissue Post Debridement is: Fat layer exposed. Post procedure Diagnosis Wound #6: Same as Pre-Procedure Pre-procedure diagnosis of Wound #6 is a Lymphedema located on the Left,Anterior Lower Leg . There was a Double Layer  Compression Therapy Procedure by Thayer [place]. Post procedure Diagnosis Wound #6: Same as Pre-Procedure Plan Follow-up Appointments: Return Appointment in 1 week. - Dr. Mikey Bussing 11/12/2022 3pm room 8 Other: - [facility] will send out the thigh high compression garments. Anesthetic: (In clinic) Topical Lidocaine 4% applied to wound bed - Used in Clinic Bathing/ Shower/ Hygiene: May shower and wash wound with soap and water. - Please do not get right leg wet with compression wraps. Please keep wrap on all week. Can use Cast Protector-purchase at [employer] etc. Edema Control - Lymphedema / SCD / Other: Elevate legs to the level of the heart or above for 30 minutes daily and/or when sitting for 3-4 times a day throughout the day. Avoid standing for long periods of time. Exercise regularly Moisturize legs daily. Compression stocking or Garment 30-40 mm/Hg pressure to: - thigh high compression stockings- sippers if available; qty 3 for both legs every 6 months for life. Other Edema Control Orders/Instructions: - tubigrip to right leg size E double layer wear until you arrive, remove and then place your compression stocking. If compression wraps slide down please call wound center and speak with a  nurse. WOUND #6: - Lower Leg Wound Laterality: Left, Anterior Cleanser: Soap and Water 1 x Per Week/30 Days Discharge Instructions: May shower and wash wound with dial antibacterial soap and water prior to dressing change. Cleanser: Vashe 5.8 (oz) 1 x Per Week/30 Days Discharge Instructions: Cleanse the wound with Vashe prior to applying a clean dressing using gauze sponges, not tissue or cotton balls. Prim Dressing: Maxorb Extra Ag+ Alginate Dressing, 2x2 (in/in) 1 x Per Week/30 Days ary Discharge Instructions: Apply to wound bed as instructed Secondary Dressing: CarboFLEX Odor Control Dressing, 4x4 in 1 x Per Week/30 Days Discharge Instructions: Apply over primary dressing as directed. Secondary  Dressing: Woven Gauze Sponge, Non-Sterile 4x4 in 1 x Per Week/30 Days Discharge Instructions: Apply over primary dressing as directed. Com pression Wrap: Urgo K2, (equivalent to a 4 layer) two layer compression system, regular 1 x Per Week/30 Days Discharge Instructions: Apply Urgo K2 as directed (alternative to 4 layer compression). 1. Silver alginate under 4-layer compressionleft lower extremity 2. Compression garment daily on the right lower extremity 3. Order thigh-high compression stockings 4. Follow-up in 1 week Electronic Signature(s) Signed: 11/05/2022 4:36:37 PM By: [name] Corwin DO Entered By: [name] on 11/05/2022 15:13:23 -------------------------------------------------------------------------------- HxROS Details Patient Name: Date of Service: [name], [name] 11/05/2022 2:30 PM Medical Record Number: [id number] Patient Account Number: [id number] Date of Birth/Sex: Treating RN: 28-Jan-1951 (72 y.o. M) Primary Care Provider: Everrett [name] Other Clinician: Referring Provider: Treating Provider/Extender: [name] in Treatment: 4 Information Obtained From Patient Chart Hematologic/Lymphatic Shepherd, [name] ([id number]) [id number] Page 8 of 9 Medical History: Positive for: Anemia; Lymphedema - Bilateral Lower legs Respiratory Medical History: Past Medical History Notes: Hx: Pulmonary Embolism Cardiovascular Medical History: Positive for: Congestive Heart Failure; Deep Vein Thrombosis Gastrointestinal Medical History: Past Medical History Notes: GERD Endocrine Medical History: Positive for: Type II Diabetes Past Medical History Notes: Hx: Hypothyroidism Time with diabetes: 33 years Treated with: Insulin Genitourinary Medical History: Past Medical History Notes: NW:[id number] Immunological Medical History: Past Medical History Notes: QI:ONGEXBMWUX disease Musculoskeletal Medical History: Positive  for: Rheumatoid Arthritis Past Medical History Notes: Hx: Necrotizing Myopathy Psychiatric Medical History: Past Medical History Notes: PTSD Immunizations Pneumococcal Vaccine: Received Pneumococcal Vaccination: Yes Received Pneumococcal Vaccination On or After 60th Birthday: No Implantable Devices None Hospitalization / Surgery History Type of Hospitalization/Surgery Biopsy left Shoulder; Angiogram Pulmonary Bilateral Family and Social History Never smoker; Marital Status - Married; Alcohol Use: Never; Drug Use: No History; Caffeine Use: Daily - coffee; Financial Concerns: No; Food, Clothing or Shelter Needs: No; Support System Lacking: No; Transportation Concerns: No Electronic Signature(s) Signed: 11/05/2022 4:36:37 PM By: [name] Corwin DO Entered By: [name] on 11/05/2022 15:08:15 [name], [name] ([id number]) [id number] Page 9 of 9 -------------------------------------------------------------------------------- SuperBill Details Patient Name: Date of Service: [name], [name] 11/05/2022 Medical Record Number: [id number] Patient Account Number: [id number] Date of Birth/Sex: Treating RN: 1951-03-24 (72 y.o. M) Primary Care Provider: Everrett [name] Other Clinician: Referring Provider: Treating Provider/Extender: [name] in Treatment: 4 Diagnosis Coding ICD-10 Codes Code Description [id number] Unspecified open wound, left lower leg, initial encounter I87.312 Chronic venous hypertension (idiopathic) with ulcer of left lower extremity L97.818 Non-pressure chronic ulcer of other part of right lower leg with other specified severity I89.0 Lymphedema, not elsewhere classified 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, not elsewhere classified Facility Procedures : CPT4 Code: [id number] Description: 97597 - DEBRIDE WOUND 1ST 20 SQ CM OR < ICD-10 Diagnosis Description S81.802A Unspecified open wound, left lower leg,  initial  encounter I87.312 Chronic venous hypertension (idiopathic) with ulcer of left lower extremity Modifier: Quantity: 1 Physician Procedures : CPT4 Code Description Modifier [id number] - WC PHYS LEVEL 3 - EST PT 25 ICD-10 Diagnosis Description S81.802A Unspecified open wound, left lower leg, initial encounter I87.312 Chronic venous hypertension (idiopathic) with ulcer of left lower  extremity L97.818 Non-pressure chronic ulcer of other part of right lower leg with other specified severity I89.0 Lymphedema, not elsewhere classified Quantity: 1 : [id number] 97597 - WC PHYS DEBR WO ANESTH 20 SQ CM ICD-10 Diagnosis Description S81.802A Unspecified open wound, left lower leg, initial encounter I87.312 Chronic venous hypertension (idiopathic) with ulcer of left lower extremity Quantity: 1 Electronic Signature(s) Signed: 11/05/2022 4:36:37 PM By: [name] Corwin DO Entered By: [name] on 11/05/2022 15:13:52

## 2022-11-06 ENCOUNTER — Ambulatory Visit: Payer: Medicare Other | Admitting: Physical Therapy

## 2022-11-07 ENCOUNTER — Ambulatory Visit: Payer: Medicare Other | Admitting: Family Medicine

## 2022-11-07 DIAGNOSIS — R350 Frequency of micturition: Secondary | ICD-10-CM | POA: Diagnosis not present

## 2022-11-07 DIAGNOSIS — R3915 Urgency of urination: Secondary | ICD-10-CM | POA: Diagnosis not present

## 2022-11-08 ENCOUNTER — Ambulatory Visit: Payer: Medicare Other

## 2022-11-08 DIAGNOSIS — R262 Difficulty in walking, not elsewhere classified: Secondary | ICD-10-CM

## 2022-11-08 DIAGNOSIS — R29898 Other symptoms and signs involving the musculoskeletal system: Secondary | ICD-10-CM | POA: Diagnosis not present

## 2022-11-08 DIAGNOSIS — M6281 Muscle weakness (generalized): Secondary | ICD-10-CM | POA: Diagnosis not present

## 2022-11-08 NOTE — Therapy (Signed)
OUTPATIENT PHYSICAL THERAPY LOWER EXTREMITY TREATMENT   Patient Name: Devonne Lalani MRN: [id number] DOB:10-14-1950, 72 y.o., male Today's Date: 11/08/2022  END OF SESSION:  PT End of Session - 11/08/22 1446     Visit Number 4    Number of Visits 16    Date for PT Re-Evaluation 12/04/22    Authorization Type Medicare    Progress Note Due on Visit 10    PT Start Time 1447    PT Stop Time 1530    PT Time Calculation (min) 43 min    Activity Tolerance Patient tolerated treatment well    Behavior During Therapy WFL for tasks assessed/performed             Past Medical History:  Diagnosis Date   Autoimmune disease (HCC)    Bronchitis    Chronic HFrEF (heart failure with reduced ejection fraction) (HCC)    normalized EF   Chronic hiccups    Chronic kidney disease, stage 3a (HCC)    DM2 (diabetes mellitus, type 2) (HCC) 09/28/2014   DVT (deep venous thrombosis) (HCC)    GERD (gastroesophageal reflux disease)    Hypogonadism in male 04/01/2018   Hypothyroidism    Joint pain    Necrotizing myopathy    Nonobstructive atherosclerosis of coronary artery    Posttraumatic stress disorder 01/30/2022   PTSD (post-traumatic stress disorder)    Pulmonary embolism (HCC)    Rheumatoid arthritis (HCC) 08/09/2021   RSV (respiratory syncytial virus pneumonia) 03/11/2022   Past Surgical History:  Procedure Laterality Date   BIOPSY SHOULDER     Left   IR ANGIOGRAM PULMONARY BILATERAL SELECTIVE  07/22/2021   IR THROMBECT VENO MECH MOD SED  07/22/2021   IR US GUIDE VASC ACCESS LEFT  07/22/2021   RADIOLOGY WITH ANESTHESIA Right 07/22/2021   Procedure: IR WITH ANESTHESIA;  Surgeon: Radiologist, Medication, MD;  Location: [facility] OR;  Service: Radiology;  Laterality: Right;   UPPER LEG SOFT TISSUE BIOPSY Right    Patient Active Problem List   Diagnosis Date Noted   Lymphedema 09/13/2022   Syncope and collapse 09/13/2022   Syncope 09/12/2022   Venous stasis ulcer without varicose veins (HCC)  09/09/2022   Anemia 09/09/2022   Skin ulcer of buttock (HCC) 09/09/2022   Generalized weakness 09/09/2022   Diarrhea 09/09/2022   Oropharyngeal dysphagia 04/23/2022   Leg swelling 04/23/2022   Confusion 04/23/2022   Type 2 diabetes mellitus with other diabetic ophthalmic complication (HCC) 04/16/2022   Shoulder pain 03/22/2022   Hypoglycemia 03/20/2022   Frequent falls 03/19/2022   Chronic combined systolic and diastolic CHF (congestive heart failure) (HCC) 03/11/2022   CKD (chronic kidney disease), stage III (HCC) 03/11/2022   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   History of DVT (deep vein thrombosis) 03/11/2022   History of pulmonary embolism 03/11/2022   Hyperlipidemia 03/11/2022   Obesity (BMI 30-39.9) 03/11/2022   CAD (coronary artery disease) 01/30/2022   Depression 01/30/2022   Cardiomyopathy (HCC) 01/30/2022   Posttraumatic stress disorder 01/30/2022   Rheumatoid arthritis (HCC) 08/09/2021   New onset of headaches after age 2 08/09/2021   Non-ST elevation (NSTEMI) myocardial infarction [facility])    Acute pulmonary embolism (HCC) 07/22/2021   Acute DVT (deep venous thrombosis) (HCC) 07/22/2021   Elevated troponin 07/22/2021   Anxiety 07/22/2021   Ankle swelling 01/17/2020   GERD (gastroesophageal reflux disease) 06/04/2018   Hypogonadism in male 04/01/2018   Macrocytic anemia 09/30/2017   Itching 09/30/2017   Hypothyroidism 09/30/2017   Low testosterone 09/30/2017  Benign prostatic hyperplasia without lower urinary tract symptoms 09/30/2017   Erectile dysfunction 03/19/2016   Benign essential HTN 08/08/2015   Branch retinal vein occlusion with macular edema of left eye 03/25/2015   Nuclear sclerotic cataract of both eyes 03/25/2015   Myositis 09/28/2014   DM2 (diabetes mellitus, type 2) (HCC) 09/28/2014   Abnormal glucose 10/04/2012    PCP: Everrett Coombe, DO  REFERRING PROVIDER: Everrett Coombe, DO  REFERRING DIAG: R53.1 (ICD-10-CM) - Generalized weakness  THERAPY  DIAG:  Muscle weakness (generalized)  Difficulty in walking, not elsewhere classified  Other symptoms and signs involving the musculoskeletal system  Rationale for Evaluation and Treatment: Rehabilitation  ONSET DATE: worsening for ~1 year  SUBJECTIVE:   SUBJECTIVE STATEMENT: Patient reports the "blisters" on his lower legs have been leaking, especially if they bump against something (tendency to burst). Patient states he has been walking around the house with the walker   Eval: Pt has necrotizing myopathy (was in the [employer] and exposed to [employer]). Wife states his muscles are shredded and he has lymphedema. Wife reports L side is weaker. No CVA, seizure or heart attacks. Pt has a bike at home that he works on but no real exercise regimen. He has had PT in the past but has not maintained. Has wound on R LE from the lymphedema. Pt is having a hard time walking up stairs. Used to be able to use cane. Has been using a walker as both feet have drop foot. Has not been able to sleep upstairs. Pt reports 4 hospital admissions in the past year. Pt states he is only able to perform static standing for 1 min.   PERTINENT HISTORY: history of autoimmune myopathy, rheumatoid arthritis, diabetes mellitus type 2, DVT and PE on Eliquis, chronic combined CHF, nonobstructive CAD, chronic lymphedema, CKD stage IIIa, OSA, GERD, hypothyroidism  PAIN:  Are you having pain? Yes: NPRS scale: currently 4, up to 15/10 Pain location: Mostly in knees Pain description: aching Aggravating factors: intermittent Relieving factors: tylenol  PRECAUTIONS: None  WEIGHT BEARING RESTRICTIONS: No  FALLS:  Has patient fallen in last 6 months? Yes. Number of falls 5 -- happened during walking  LIVING ENVIRONMENT: Lives with: lives with their family and lives with their spouse son is home from college Lives in: House/apartment Stairs: Yes: Internal: 17 steps; on right going up and External: 4 in garage, 5 in  front steps; none Has following equipment at home: Quad cane large base and manual wheelchair/rollator convertible  OCCUPATION: Retired Hotel manager  PLOF: Independent  PATIENT GOALS: Walk with the cane inside the house, less assist to go up/down stairs  NEXT MD VISIT: n/a  OBJECTIVE: (Measures in this section from initial evaluation unless otherwise noted)  DIAGNOSTIC FINDINGS: nothing recent  PATIENT SURVEYS:  LEFS 10%  COGNITION: Overall cognitive status: Within functional limits for tasks assessed     SENSATION: Notes history of neuropathy  EDEMA:  Lymphedema bilat   MUSCLE LENGTH: did not assess  POSTURE: rounded shoulders, increased thoracic kyphosis, and flexed trunk   PALPATION: Did not assess  LOWER EXTREMITY ROM:  Active ROM Right eval Left eval  Hip flexion    Hip extension    Hip abduction    Hip adduction    Hip internal rotation    Hip external rotation    Knee flexion    Knee extension    Ankle dorsiflexion    Ankle plantarflexion    Ankle inversion    Ankle eversion     (  Blank rows = not tested)  LOWER EXTREMITY MMT:  MMT Right eval Left eval  Hip flexion 2+ 3-  Hip extension    Hip abduction 4- 4-  Hip adduction 4- 5  Hip internal rotation    Hip external rotation    Knee flexion 3+ 3+  Knee extension 2+ 3-  Ankle dorsiflexion    Ankle plantarflexion    Ankle inversion    Ankle eversion     (Blank rows = not tested)  LOWER EXTREMITY SPECIAL TESTS:  Did not assess  FUNCTIONAL TESTS:  5 times sit to stand: 40 sec TUG: 54 sec  GAIT: Distance walked: 100' Assistive device utilized: Environmental consultant - 4 wheeled Level of assistance: SBA Comments: Increased trunk flexion, decreased heel strike   TODAY'S TREATMENT:   [facility] Adult PT Treatment:                                                DATE: 11/08/2022 Therapeutic Activity: Seated on airex: Knee extension, focus on upright posture Marching + opposite arm lift HS stretch w/strap  (foot on stool) A/PPT --> APT + scap squeeze 3x10 sec D2 UE RTB x10 (B) Standing" Bicep curls GTB x10 3#DB cross punches Fwd walking with rollator x80' (start), x80' (end) Seated anterior weight shifting (CGA/min A) to improve STS transfers Self Care: Wound care (blister popped)   [facility] Adult PT Treatment:                                                DATE: 10/30/22 Therapeutic Exercise: Seated heel slide for warm up x10 Seated hamstring stretch x 30 sec Seated figure 4 stretch x 30 sec Seated anterior/posterior pelvic tilting x10 Seated anterior pelvic tilt with scap squeeze 3x10 sec Seated pball press down 10x5 sec Seated pball press down + LAQ 2x10 Seated marching 2x10 Standing gastroc stretch x30 sec R&L Standing glute set 2x10 Gait: 1 1/4 laps around gym with rollator CGA for safety; limited due to fatigue, small shuffling steps with decreased heel strike due to drop foot   [facility] Adult PT Treatment:                                                DATE: 10/25/2022 Therapeutic Activity: STS minA-CGA Standing lateral weight shifting Standing marching Walking CGA (4WW) 2x80" Seated on airex: Heels slides LAQs Resisted marching (RTB above knees) x10, no resistance x10  Glute set 10x3" Supine (head elevated with pillows): LTR Assisted SLR Min A sit <--> supine  DATE: 10/09/22  See HEP below  PATIENT EDUCATION:  Education details: Updatedl HEP Person educated: Patient and Spouse Education method: Explanation, Demonstration, and Handouts Education comprehension: verbalized understanding, returned demonstration, and needs further education  HOME EXERCISE PROGRAM: Access Code: [email or web address] URL: [email or web address] Date: 10/25/2022 Prepared by: Carlynn Herald  Exercises - Seated Heel Slide  - 1 x daily - 7 x weekly - 2-3 sets - 10  reps - Seated Long Arc Quad  - 1 x daily - 7 x weekly - 2-3 sets - 10 reps - Seated Ankle Pumps  - 1 x daily - 7 x weekly - 2-3 sets - 10 reps - Seated Isometric Hip Adduction with Ball  - 1 x daily - 7 x weekly - 2-3 sets - 10 reps - 5 sec hold - Seated Hip Abduction with Resistance  - 1 x daily - 7 x weekly - 2-3 sets - 10 reps - 5 sec hold - Standing March with Counter Support  - 1 x daily - 7 x weekly - 3 sets - 10 reps - Seated March  - 1 x daily - 7 x weekly - 3 sets - 10 reps - Seated Gluteal Sets  - 1 x daily - 7 x weekly - 3 sets - 10 reps - 3-5 sec hold  ASSESSMENT:  CLINICAL IMPRESSION: Blister burst while wife was helping patient transfer feet off of foot rest in WC; PTA cleaned wound and applied bandage. Patient's wife states she will follow up tomorrow with MD on current state of legs and on-going weeping of wounds. Treatment focused on endurance conditioning; cueing provided to decrease posterior trunk lean during seated exercises. Patient instructed in anterior weight shifting during sit to stand transfers to increase LE strength and support; min A and verbal cues required during activity. Patient continues to fatigue quickly and required multiple rest breaks throughout session. Patient will continue to benefit from skilled therapy to progress endurance and tolerance with physical activity and improve overall strength, safety, and mobility with transfers.  Eval: Patient is a 72 y.o. M who was seen today for physical therapy evaluation and treatment for generalized weakness. PMH significant for lymphedema, neuropathy and Agent [employer] exposure leading to myopathy. Assessment demos L UE weakness with R LE weakness leading to decreased balance, standing and walking tolerance. Pt demos high fall risk based on his 5x STS and TUG score. Pt would benefit from PT to improve his home mobility and improve limited community mobility  OBJECTIVE IMPAIRMENTS: Abnormal gait, decreased activity  tolerance, decreased balance, decreased endurance, decreased mobility, difficulty walking, decreased ROM, decreased strength, increased muscle spasms, impaired flexibility, improper body mechanics, postural dysfunction, and pain.   ACTIVITY LIMITATIONS: standing, squatting, stairs, transfers, bathing, toileting, dressing, hygiene/grooming, and locomotion level  PARTICIPATION LIMITATIONS: meal prep, cleaning, laundry, driving, and shopping  PERSONAL FACTORS: Fitness, Past/current experiences, and Time since onset of injury/illness/exacerbation are also affecting patient's functional outcome.   REHAB POTENTIAL: Good  CLINICAL DECISION MAKING: Evolving/moderate complexity  EVALUATION COMPLEXITY: Moderate   GOALS: Goals reviewed with patient? Yes  SHORT TERM GOALS: Target date: 11/06/2022  Pt will be ind with initial HEP Baseline: Goal status: INITIAL  2.  Pt will have improved 5x STS to </=35 sec without UE support to demo increased functional LE strength Baseline:  Goal status: INITIAL  3.  Pt will have TUG time to </=50 sec with rollator to demo improving gait Baseline:  Goal status: INITIAL   LONG TERM GOALS: Target date: 12/04/2022  Pt will be ind with management and progression of HEP Baseline:  Goal status: INITIAL  2.  Pt will have TUG time to </=50 sec with SPC to demo improving gait for home Baseline:  Goal status: INITIAL  3.  Pt will be able to amb at least 400' with [facility] for home mobility Baseline:  Goal status: INITIAL  4.  Pt will be able to ascend/descend 17 steps with SPC to get to his bedroom  Baseline:  Goal status: INITIAL  5.  Pt will have improved LEFS to 20% to demo [facility] Baseline:  Goal status: INITIAL   PLAN:  PT FREQUENCY: 2x/week  PT DURATION: 8 weeks  PLANNED INTERVENTIONS: Therapeutic exercises, Therapeutic activity, Neuromuscular re-education, Balance training, Gait training, Patient/Family education, Self Care, Joint mobilization,  Stair training, Orthotic/Fit training, Aquatic Therapy, Dry Needling, Electrical stimulation, Cryotherapy, Moist heat, Taping, Vasopneumatic device, Ionotophoresis 4mg /ml Dexamethasone, Manual therapy, and Re-evaluation  PLAN FOR NEXT SESSION: Continue to strengthen bilat LE and core as tolerated. Continue balance/stability exercises. Work on gait/endurance.   Sanjuana Mae, PTA 11/08/2022, 3:59 PM

## 2022-11-12 ENCOUNTER — Encounter (HOSPITAL_BASED_OUTPATIENT_CLINIC_OR_DEPARTMENT_OTHER): Payer: Medicare Other | Admitting: Internal Medicine

## 2022-11-12 DIAGNOSIS — L97818 Non-pressure chronic ulcer of other part of right lower leg with other specified severity: Secondary | ICD-10-CM | POA: Diagnosis not present

## 2022-11-12 DIAGNOSIS — L89312 Pressure ulcer of right buttock, stage 2: Secondary | ICD-10-CM | POA: Diagnosis not present

## 2022-11-12 DIAGNOSIS — I890 Lymphedema, not elsewhere classified: Secondary | ICD-10-CM

## 2022-11-12 DIAGNOSIS — I87311 Chronic venous hypertension (idiopathic) with ulcer of right lower extremity: Secondary | ICD-10-CM | POA: Diagnosis not present

## 2022-11-12 DIAGNOSIS — L89322 Pressure ulcer of left buttock, stage 2: Secondary | ICD-10-CM | POA: Diagnosis not present

## 2022-11-12 DIAGNOSIS — E11621 Type 2 diabetes mellitus with foot ulcer: Secondary | ICD-10-CM | POA: Diagnosis not present

## 2022-11-12 DIAGNOSIS — I87312 Chronic venous hypertension (idiopathic) with ulcer of left lower extremity: Secondary | ICD-10-CM

## 2022-11-13 ENCOUNTER — Ambulatory Visit: Payer: Medicare Other | Admitting: Family Medicine

## 2022-11-13 ENCOUNTER — Encounter: Payer: Medicare Other | Admitting: Physical Therapy

## 2022-11-13 NOTE — Progress Notes (Signed)
ALP, GOLDWATER ([id number]) [id number] Page 1 of 11 Visit Report for 11/12/2022 Arrival Information Details Patient Name: Date of Service: [name], [name] 11/12/2022 3:00 PM Medical Record Number: [id number] Patient Account Number: [id number] Date of Birth/Sex: Treating RN: 11-03-1950 (72 y.o. M) Primary Care Annalycia Done: Everrett Coombe Other Clinician: Referring Samar Dass: Treating Mussa Groesbeck/Extender: Cheri Guppy in Treatment: 5 Visit Information History Since Last Visit Added or deleted any medications: No Patient Arrived: Walker Any new allergies or adverse reactions: No Arrival Time: 15:14 Had a fall or experienced change in No Accompanied By: wife activities of daily living that may affect Transfer Assistance: None risk of falls: Patient Identification Verified: Yes Signs or symptoms of abuse/neglect since last visito No Secondary Verification Process Completed: Yes Hospitalized since last visit: No Patient Requires Transmission-Based Precautions: No Implantable device outside of the clinic excluding No Patient Has Alerts: Yes cellular tissue based products placed in the center Patient Alerts: ELIQUIS since last visit: Has Dressing in Place as Prescribed: Yes Has Compression in Place as Prescribed: Yes Pain Present Now: No Electronic Signature(s) Signed: 11/12/2022 4:10:29 PM By: Thayer Dallas Entered By: Thayer Dallas on 11/12/2022 15:16:32 -------------------------------------------------------------------------------- Compression Therapy Details Patient Name: Date of Service: [name], [name] 11/12/2022 3:00 PM Medical Record Number: [id number] Patient Account Number: [id number] Date of Birth/Sex: Treating RN: Sep 29, 1950 (72 y.o. Marlan Palau Primary Care Verdis Koval: Everrett Coombe Other Clinician: Referring Ahna Konkle: Treating Dencil Cayson/Extender: Cheri Guppy in Treatment: 5 Compression Therapy  Performed for Wound Assessment: Wound #7 Right,Anterior Lower Leg Performed By: Clinician Samuella Bruin, RN Compression Type: Double Layer Post Procedure Diagnosis Same as Pre-procedure Electronic Signature(s) Signed: 11/12/2022 4:41:34 PM By: Samuella Bruin Entered By: Samuella Bruin on 11/12/2022 15:52:03 Bukowski, Gibril ([id number]) [id number] Page 2 of 11 -------------------------------------------------------------------------------- Encounter Discharge Information Details Patient Name: Date of Service: [name], [name] 11/12/2022 3:00 PM Medical Record Number: [id number] Patient Account Number: [id number] Date of Birth/Sex: Treating RN: 02/22/1951 (72 y.o. Marlan Palau Primary Care Velecia Ovitt: Everrett Coombe Other Clinician: Referring Steffi Noviello: Treating Bailey Kolbe/Extender: Cheri Guppy in Treatment: 5 Encounter Discharge Information Items Discharge Condition: Stable Ambulatory Status: Wheelchair Discharge Destination: Home Transportation: Private Auto Accompanied By: wife Schedule Follow-up Appointment: Yes Clinical Summary of Care: Patient Declined Electronic Signature(s) Signed: 11/12/2022 4:41:34 PM By: Samuella Bruin Entered By: Samuella Bruin on 11/12/2022 16:31:46 -------------------------------------------------------------------------------- Lower Extremity Assessment Details Patient Name: Date of Service: [name], [name] 11/12/2022 3:00 PM Medical Record Number: [id number] Patient Account Number: [id number] Date of Birth/Sex: Treating RN: 05/10/1950 (72 y.o. Marlan Palau Primary Care Syrena Burges: Everrett Coombe Other Clinician: Referring Starlena Beil: Treating Shrita Thien/Extender: Cheri Guppy in Treatment: 5 Edema Assessment Assessed: [Left: No] [Right: No] Edema: [Left: Yes] [Right: No] Calf Left: Right: Point of Measurement: 37 cm From Medial Instep 46.5 cm 49  cm Ankle Left: Right: Point of Measurement: 10 cm From Medial Instep 24 cm 29.5 cm Vascular Assessment Pulses: Dorsalis Pedis Palpable: [Left:Yes] [Right:Yes] Electronic Signature(s) Signed: 11/12/2022 4:41:34 PM By: Samuella Bruin Entered By: Samuella Bruin on 11/12/2022 15:38:05 Multi Wound Chart Details -------------------------------------------------------------------------------- [name] ([id number]) [id number] Page 3 of 11 Patient Name: Date of Service: [name], [name] 11/12/2022 3:00 PM Medical Record Number: [id number] Patient Account Number: [id number] Date of Birth/Sex: Treating RN: 04-Oct-1950 (72 y.o. M) Primary Care Halli Equihua: Everrett Coombe Other Clinician: Referring Sheina Mcleish: Treating Namya Voges/Extender: Cheri Guppy in Treatment: 5 Vital Signs Height(in): 68 Pulse(bpm): 68 Weight(lbs): 220 Blood Pressure(mmHg): 125/71 Body Mass Index(BMI): 33.4 Temperature(F):  98.5 Respiratory Rate(breaths/min): 18 Wound Assessments Wound Number: 6 7 8  Photos: No Photos Left, Anterior Lower Leg Right, Anterior Lower Leg Right, Medial Lower Leg Wound Location: Blister Blister Blister Wounding Event: Lymphedema Lymphedema Lymphedema Primary Etiology: Venous Leg Ulcer N/A N/A Secondary Etiology: Anemia, Lymphedema, Congestive Anemia, Lymphedema, Congestive Anemia, Lymphedema, Congestive Comorbid History: Heart Failure, Deep Vein Thrombosis,Heart Failure, Deep Vein Thrombosis, Heart Failure, Deep Vein Thrombosis, Type II Diabetes, Rheumatoid Arthritis Type II Diabetes, Rheumatoid Arthritis Type II Diabetes, Rheumatoid Arthritis 11/05/2022 11/06/2022 11/06/2022 Date Acquired: 1 0 0 Weeks of Treatment: Healed - Epithelialized Open Open Wound Status: No No No Wound Recurrence: 0x0x0 2x2.1x0.1 0.5x1x0.1 Measurements L x W x D (cm) 0 3.299 0.393 A (cm) : rea 0 0.33 0.039 Volume (cm) : 100.00% N/A N/A % Reduction in  Area: 100.00% N/A N/A % Reduction in Volume: Partial Thickness Full Thickness Without Exposed Full Thickness Without Exposed Classification: Support Structures Support Structures None Present Medium Medium Exudate Amount: N/A Serous Serous Exudate Type: N/A amber amber Exudate Color: Distinct, outline attached Distinct, outline attached Distinct, outline attached Wound Margin: None Present (0%) Large (67-100%) Large (67-100%) Granulation Amount: N/A Red Red Granulation Quality: None Present (0%) Small (1-33%) None Present (0%) Necrotic Amount: Fascia: No Fat Layer (Subcutaneous Tissue): Yes Fat Layer (Subcutaneous Tissue): Yes Exposed Structures: Fat Layer (Subcutaneous Tissue): No Fascia: No Fascia: No Tendon: No Tendon: No Tendon: No Muscle: No Muscle: No Muscle: No Joint: No Joint: No Joint: No Bone: No Bone: No Bone: No Large (67-100%) None None Epithelialization: Excoriation: No No Abnormalities Noted No Abnormalities Noted Periwound Skin Texture: Induration: No Callus: No Crepitus: No Rash: No Scarring: No Maceration: No No Abnormalities Noted No Abnormalities Noted Periwound Skin Moisture: Dry/Scaly: No Atrophie Blanche: No No Abnormalities Noted No Abnormalities Noted Periwound Skin Color: Cyanosis: No Ecchymosis: No Erythema: No Hemosiderin Staining: No Mottled: No Pallor: No Rubor: No N/A No Abnormality No Abnormality Temperature: Wound Number: 9 N/A N/A Photos: No Photos N/A N/A Right, Lateral Lower Leg N/A N/A Wound Location: Blister N/A N/A Wounding Event: Lymphedema N/A N/A Primary Etiology: N/A N/A N/A Secondary Etiology: Anemia, Lymphedema, Congestive N/A N/A Comorbid History: Olaes, Ottis ([id number]) [id number] Page 4 of 11 Heart Failure, Deep Vein Thrombosis, Type II Diabetes, Rheumatoid Arthritis 11/06/2022 N/A N/A Date Acquired: 0 N/A N/A Weeks of Treatment: Open N/A N/A Wound Status: No N/A  N/A Wound Recurrence: 0.7x2x0.1 N/A N/A Measurements L x W x D (cm) 1.1 N/A N/A A (cm) : rea 0.11 N/A N/A Volume (cm) : N/A N/A N/A % Reduction in Area: N/A N/A N/A % Reduction in Volume: Full Thickness Without Exposed N/A N/A Classification: Support Structures Medium N/A N/A Exudate Amount: Serous N/A N/A Exudate Type: amber N/A N/A Exudate Color: Distinct, outline attached N/A N/A Wound Margin: Large (67-100%) N/A N/A Granulation Amount: Red N/A N/A Granulation Quality: None Present (0%) N/A N/A Necrotic Amount: Fat Layer (Subcutaneous Tissue): Yes N/A N/A Exposed Structures: Fascia: No Tendon: No Muscle: No Joint: No Bone: No None N/A N/A Epithelialization: No Abnormalities Noted N/A N/A Periwound Skin Texture: No Abnormalities Noted N/A N/A Periwound Skin Moisture: No Abnormalities Noted N/A N/A Periwound Skin Color: No Abnormality N/A N/A Temperature: Treatment Notes Electronic Signature(s) Signed: 11/13/2022 4:51:26 PM By: Geralyn Corwin DO Entered By: Geralyn Corwin on 11/12/2022 15:50:26 -------------------------------------------------------------------------------- Multi-Disciplinary Care Plan Details Patient Name: Date of Service: STEVENSON, WINDMILLER 11/12/2022 3:00 PM Medical Record Number: [id number] Patient Account Number: [id number] Date of Birth/Sex: Treating RN: 09/03/1950 (72 y.o.  Marlan Palau Primary Care Montarius Kitagawa: Everrett Coombe Other Clinician: Referring Humbert Morozov: Treating Alandis Bluemel/Extender: Cheri Guppy in Treatment: 5 Active Inactive Wound/Skin Impairment Nursing Diagnoses: Impaired tissue integrity Goals: Patient/caregiver will verbalize understanding of skin care regimen Date Initiated: 10/04/2022 Target Resolution Date: 12/29/2022 Goal Status: Active Interventions: Assess ulceration(s) every visit Treatment Activities: Skin care regimen initiated : 10/04/2022 Notes: Electronic  Signature(s) [place], [name] ([id number]) [id number] Page 5 of 11 Signed: 11/12/2022 4:41:34 PM By: Samuella Bruin Entered By: Samuella Bruin on 11/12/2022 16:31:02 -------------------------------------------------------------------------------- Pain Assessment Details Patient Name: Date of Service: RASHAWD, LASKARIS 11/12/2022 3:00 PM Medical Record Number: [id number] Patient Account Number: [id number] Date of Birth/Sex: Treating RN: 1951/03/08 (72 y.o. Marlan Palau Primary Care Jadarius Commons: Everrett Coombe Other Clinician: Referring Verdie Barrows: Treating Grasiela Jonsson/Extender: Cheri Guppy in Treatment: 5 Active Problems Location of Pain Severity and Description of Pain Patient Has Paino No Site Locations Rate the pain. Current Pain Level: 0 Pain Management and Medication Current Pain Management: Electronic Signature(s) Signed: 11/12/2022 4:41:34 PM By: Samuella Bruin Entered By: Samuella Bruin on 11/12/2022 15:37:26 -------------------------------------------------------------------------------- Patient/Caregiver Education Details Patient Name: Date of Service: Sweetman, RUDA R 7/15/2024andnbsp3:00 PM Medical Record Number: [id number] Patient Account Number: [id number] Date of Birth/Gender: Treating RN: 02-12-1951 (72 y.o. Marlan Palau Primary Care Physician: Everrett Coombe Other Clinician: Referring Physician: Treating Physician/Extender: Cheri Guppy in Treatment: 5 Education Assessment Education Provided To: Patient [place], [name] ([id number]) [id number] Page 6 of 11 Education Topics Provided Wound/Skin Impairment: Methods: Explain/Verbal Responses: Reinforcements needed, State content correctly Electronic Signature(s) Signed: 11/12/2022 4:41:34 PM By: Samuella Bruin Entered By: Samuella Bruin on 11/12/2022  16:31:11 -------------------------------------------------------------------------------- Wound Assessment Details Patient Name: Date of Service: [name], [name] 11/12/2022 3:00 PM Medical Record Number: [id number] Patient Account Number: [id number] Date of Birth/Sex: Treating RN: 17-Sep-1950 (72 y.o. M) Primary Care Rokia Bosket: Everrett Coombe Other Clinician: Referring Breionna Punt: Treating Orrie Lascano/Extender: Cheri Guppy in Treatment: 5 Wound Status Wound Number: 6 Primary Lymphedema Etiology: Wound Location: Left, Anterior Lower Leg Secondary Venous Leg Ulcer Wounding Event: Blister Etiology: Date Acquired: 11/05/2022 Wound Healed - Epithelialized Weeks Of Treatment: 1 Status: Clustered Wound: No Comorbid Anemia, Lymphedema, Congestive Heart Failure, Deep Vein History: Thrombosis, Type II Diabetes, Rheumatoid Arthritis Photos Wound Measurements Length: (cm) Width: (cm) Depth: (cm) Area: (cm) Volume: (cm) 0 % Reduction in Area: 100% 0 % Reduction in Volume: 100% 0 Epithelialization: Large (67-100%) 0 Tunneling: No 0 Undermining: No Wound Description Classification: Partial Thickness Wound Margin: Distinct, outline attached Exudate Amount: None Present Foul Odor After Cleansing: No Slough/Fibrino No Wound Bed Granulation Amount: None Present (0%) Exposed Structure Necrotic Amount: None Present (0%) Fascia Exposed: No Fat Layer (Subcutaneous Tissue) Exposed: No Tendon Exposed: No Muscle Exposed: No Joint Exposed: No Bone Exposed: No Periwound Skin Texture Leinweber, Kayvion ([id number]) [id number] Page 7 of 11 Texture Color No Abnormalities Noted: No No Abnormalities Noted: No Callus: No Atrophie Blanche: No Crepitus: No Cyanosis: No Excoriation: No Ecchymosis: No Induration: No Erythema: No Rash: No Hemosiderin Staining: No Scarring: No Mottled: No Pallor: No Moisture Rubor: No No Abnormalities Noted: No Dry /  Scaly: No Maceration: No Electronic Signature(s) Signed: 11/12/2022 4:41:34 PM By: Samuella Bruin Entered By: Samuella Bruin on 11/12/2022 15:49:17 -------------------------------------------------------------------------------- Wound Assessment Details Patient Name: Date of Service: [name], [name] 11/12/2022 3:00 PM Medical Record Number: [id number] Patient Account Number: [id number] Date of Birth/Sex: Treating RN: 06/05/1950 (72 y.o. Marlan Palau Primary Care Shardee Dieu: Everrett Coombe Other Clinician: Referring Caytlyn Evers:  Treating Caroleena Paolini/Extender: Sumner Boast Weeks in Treatment: 5 Wound Status Wound Number: 7 Primary Lymphedema Etiology: Wound Location: Right, Anterior Lower Leg Wound Open Wounding Event: Blister Status: Date Acquired: 11/06/2022 Comorbid Anemia, Lymphedema, Congestive Heart Failure, Deep Vein Weeks Of Treatment: 0 History: Thrombosis, Type II Diabetes, Rheumatoid Arthritis Clustered Wound: No Photos Wound Measurements Length: (cm) 2 Width: (cm) 2.1 Depth: (cm) 0.1 Area: (cm) 3.299 Volume: (cm) 0.33 % Reduction in Area: % Reduction in Volume: Epithelialization: None Tunneling: No Undermining: No Wound Description Classification: Full Thickness Without Exposed Suppor Wound Margin: Distinct, outline attached Exudate Amount: Medium Exudate Type: Serous Exudate Color: amber t Structures Foul Odor After Cleansing: No Slough/Fibrino Yes Wound Bed Granulation Amount: Large (67-100%) Exposed Structure Granulation Quality: Red Fascia Exposed: No [name], [name] ([id number]) [id number] Page 8 of 11 Necrotic Amount: Small (1-33%) Fat Layer (Subcutaneous Tissue) Exposed: Yes Necrotic Quality: Adherent Slough Tendon Exposed: No Muscle Exposed: No Joint Exposed: No Bone Exposed: No Periwound Skin Texture Texture Color No Abnormalities Noted: Yes No Abnormalities Noted: Yes Moisture Temperature /  Pain No Abnormalities Noted: Yes Temperature: No Abnormality Treatment Notes Wound #7 (Lower Leg) Wound Laterality: Right, Anterior Cleanser Soap and Water Discharge Instruction: May shower and wash wound with dial antibacterial soap and water prior to dressing change. Vashe 5.8 (oz) Discharge Instruction: Cleanse the wound with Vashe prior to applying a clean dressing using gauze sponges, not tissue or cotton balls. Peri-Wound Care Sween Lotion (Moisturizing lotion) Discharge Instruction: Apply moisturizing lotion as directed Topical Primary Dressing Maxorb Extra Ag+ Alginate Dressing, 4x4.75 (in/in) Discharge Instruction: Apply to wound bed as instructed Secondary Dressing ABD Pad, 8x10 Discharge Instruction: Apply over primary dressing as directed. CarboFLEX Odor Control Dressing, 4x4 in Discharge Instruction: Apply over primary dressing as directed. Secured With Compression Wrap Urgo K2, (equivalent to a 4 layer) two layer compression system, regular Discharge Instruction: Apply Urgo K2 as directed (alternative to 4 layer compression). Compression Stockings Add-Ons Electronic Signature(s) Signed: 11/12/2022 4:10:29 PM By: Thayer Dallas Signed: 11/12/2022 4:41:34 PM By: Gelene Mink By: Thayer Dallas on 11/12/2022 15:40:47 -------------------------------------------------------------------------------- Wound Assessment Details Patient Name: Date of Service: [name], [name] 11/12/2022 3:00 PM Medical Record Number: [id number] Patient Account Number: [id number] Date of Birth/Sex: Treating RN: October 04, 1950 (72 y.o. Marlan Palau Primary Care Laiken [name]: Everrett Coombe Other Clinician: Referring Stuti Sandin: Treating Jetta Murray/Extender: Cheri Guppy in Treatment: 5 Wound Status Wound Number: 8 Primary Lymphedema Etiology: Wound Location: Right, Medial Lower Leg Wound Open Wounding Event: Blister Status: Pizzuto, Constant ([id number])  [id number].pdf Page 9 of 11 Status: Date Acquired: 11/06/2022 Comorbid Anemia, Lymphedema, Congestive Heart Failure, Deep Vein Weeks Of Treatment: 0 History: Thrombosis, Type II Diabetes, Rheumatoid Arthritis Clustered Wound: No Wound Measurements Length: (cm) 0.5 Width: (cm) 1 Depth: (cm) 0.1 Area: (cm) 0.393 Volume: (cm) 0.039 % Reduction in Area: % Reduction in Volume: Epithelialization: None Tunneling: No Undermining: No Wound Description Classification: Full Thickness Without Exposed Support Structures Wound Margin: Distinct, outline attached Exudate Amount: Medium Exudate Type: Serous Exudate Color: amber Foul Odor After Cleansing: No Slough/Fibrino No Wound Bed Granulation Amount: Large (67-100%) Exposed Structure Granulation Quality: Red Fascia Exposed: No Necrotic Amount: None Present (0%) Fat Layer (Subcutaneous Tissue) Exposed: Yes Tendon Exposed: No Muscle Exposed: No Joint Exposed: No Bone Exposed: No Periwound Skin Texture Texture Color No Abnormalities Noted: Yes No Abnormalities Noted: Yes Moisture Temperature / Pain No Abnormalities Noted: Yes Temperature: No Abnormality Treatment Notes Wound #8 (Lower Leg) Wound Laterality: Right, Medial Cleanser Soap  and Water Discharge Instruction: May shower and wash wound with dial antibacterial soap and water prior to dressing change. Vashe 5.8 (oz) Discharge Instruction: Cleanse the wound with Vashe prior to applying a clean dressing using gauze sponges, not tissue or cotton balls. Peri-Wound Care Sween Lotion (Moisturizing lotion) Discharge Instruction: Apply moisturizing lotion as directed Topical Primary Dressing Maxorb Extra Ag+ Alginate Dressing, 4x4.75 (in/in) Discharge Instruction: Apply to wound bed as instructed Secondary Dressing ABD Pad, 8x10 Discharge Instruction: Apply over primary dressing as directed. CarboFLEX Odor Control Dressing, 4x4 in Discharge Instruction:  Apply over primary dressing as directed. Secured With Compression Wrap Urgo K2, (equivalent to a 4 layer) two layer compression system, regular Discharge Instruction: Apply Urgo K2 as directed (alternative to 4 layer compression). Compression Stockings Add-Ons Electronic Signature(s) Signed: 11/12/2022 4:41:34 PM By: Samuella Bruin Entered By: Samuella Bruin on 11/12/2022 15:48:16 [name], [name] ([id number]) [id number] Page 10 of 11 -------------------------------------------------------------------------------- Wound Assessment Details Patient Name: Date of Service: [name], [name] 11/12/2022 3:00 PM Medical Record Number: [id number] Patient Account Number: [id number] Date of Birth/Sex: Treating RN: Jun 17, 1950 (72 y.o. Marlan Palau Primary Care Anjela Cassara: Everrett Coombe Other Clinician: Referring Oneal Biglow: Treating Leeum Sankey/Extender: Cheri Guppy in Treatment: 5 Wound Status Wound Number: 9 Primary Lymphedema Etiology: Wound Location: Right, Lateral Lower Leg Wound Open Wounding Event: Blister Status: Date Acquired: 11/06/2022 Comorbid Anemia, Lymphedema, Congestive Heart Failure, Deep Vein Weeks Of Treatment: 0 History: Thrombosis, Type II Diabetes, Rheumatoid Arthritis Clustered Wound: No Wound Measurements Length: (cm) 0.7 Width: (cm) 2 Depth: (cm) 0.1 Area: (cm) 1.1 Volume: (cm) 0.11 % Reduction in Area: % Reduction in Volume: Epithelialization: None Tunneling: No Undermining: No Wound Description Classification: Full Thickness Without Exposed Support Structures Wound Margin: Distinct, outline attached Exudate Amount: Medium Exudate Type: Serous Exudate Color: amber Foul Odor After Cleansing: No Slough/Fibrino No Wound Bed Granulation Amount: Large (67-100%) Exposed Structure Granulation Quality: Red Fascia Exposed: No Necrotic Amount: None Present (0%) Fat Layer (Subcutaneous Tissue) Exposed:  Yes Tendon Exposed: No Muscle Exposed: No Joint Exposed: No Bone Exposed: No Periwound Skin Texture Texture Color No Abnormalities Noted: Yes No Abnormalities Noted: Yes Moisture Temperature / Pain No Abnormalities Noted: Yes Temperature: No Abnormality Treatment Notes Wound #9 (Lower Leg) Wound Laterality: Right, Lateral Cleanser Soap and Water Discharge Instruction: May shower and wash wound with dial antibacterial soap and water prior to dressing change. Vashe 5.8 (oz) Discharge Instruction: Cleanse the wound with Vashe prior to applying a clean dressing using gauze sponges, not tissue or cotton balls. Peri-Wound Care Sween Lotion (Moisturizing lotion) Discharge Instruction: Apply moisturizing lotion as directed Topical Primary Dressing Maxorb Extra Ag+ Alginate Dressing, 4x4.75 (in/in) Discharge Instruction: Apply to wound bed as instructed Darling, Hanzel ([id number]) [id number] Page 11 of 11 Secondary Dressing ABD Pad, 8x10 Discharge Instruction: Apply over primary dressing as directed. CarboFLEX Odor Control Dressing, 4x4 in Discharge Instruction: Apply over primary dressing as directed. Secured With Compression Wrap Urgo K2, (equivalent to a 4 layer) two layer compression system, regular Discharge Instruction: Apply Urgo K2 as directed (alternative to 4 layer compression). Compression Stockings Add-Ons Electronic Signature(s) Signed: 11/12/2022 4:41:34 PM By: Samuella Bruin Entered By: Samuella Bruin on 11/12/2022 15:50:06 -------------------------------------------------------------------------------- Vitals Details Patient Name: Date of Service: MASAICHI, KRACHT 11/12/2022 3:00 PM Medical Record Number: [id number] Patient Account Number: [id number] Date of Birth/Sex: Treating RN: 1950/05/19 (72 y.o. Marlan Palau Primary Care Stamatia Masri: Everrett Coombe Other Clinician: Referring Krystalle Pilkington: Treating Ranay Ketter/Extender: Cheri Guppy in Treatment: 5 Vital  Signs Time Taken: 15:37 Temperature (F): 98.5 Height (in): 68 Pulse (bpm): 68 Weight (lbs): 220 Respiratory Rate (breaths/min): 18 Body Mass Index (BMI): 33.4 Blood Pressure (mmHg): 125/71 Reference Range: 80 - 120 mg / dl Electronic Signature(s) Signed: 11/12/2022 4:41:34 PM By: Samuella Bruin Entered By: Samuella Bruin on 11/12/2022 15:37:19

## 2022-11-13 NOTE — Progress Notes (Signed)
[name], [name] ([id number]) [id number] Page 1 of 10 Visit Report for 11/12/2022 Chief Complaint Document Details Patient Name: Date of Service: [name], [name] 11/12/2022 3:00 PM Medical Record Number: [id number] Patient Account Number: [id number] Date of Birth/Sex: Treating RN: 09-25-1950 (72 y.o. M) Primary Care Provider: Everrett Coombe Other Clinician: Referring Provider: Treating Provider/Extender: Cheri Guppy in Treatment: 5 Information Obtained from: Patient Chief Complaint 10/04/2022; patient is here for review of multiple wounds Electronic Signature(s) Signed: 11/13/2022 4:51:26 PM By: Geralyn Corwin DO Entered By: Geralyn Corwin on 11/12/2022 15:50:34 -------------------------------------------------------------------------------- HPI Details Patient Name: Date of Service: [name], [name] 11/12/2022 3:00 PM Medical Record Number: [id number] Patient Account Number: [id number] Date of Birth/Sex: Treating RN: 04-02-1951 (72 y.o. M) Primary Care Provider: Everrett Coombe Other Clinician: Referring Provider: Treating Provider/Extender: Cheri Guppy in Treatment: 5 History of Present Illness HPI Description: ADMISSION 10/04/2022 This is a 72 year old man who is quite disabled because of some form of myopathy. He follows for this with neurology. He has had biopsies that showed necrosis without inflammation. 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 In any case he is reasonably immobile at home. He has this apparatus to help him transfer. Because of difficulties getting him upstairs he has been sleeping in a recliner on the main floor. He also has a history of lymphedema and I see he was followed by PT for a period of time. He has a history of combined congestive heart failure but his last echo showed an EF of 60 to  65%. He has developed a wound on the right lateral leg over the last month or so. He also has a smaller wound on the right anterior lower leg. He is having problems with his left second toe and he has bilateral stage II ulcers over his ischial tuberosity. The patient is complaining of oral pain under his tongue. He has bilateral heel pain and increasing edema with weight gain over the past week or 2 Past medical history includes myopathy/myositis question inclusion body myositis, rheumatoid arthritis, coronary artery disease, massive pulmonary embolism, type 2 diabetes, lymphedema, combined congestive heart failure, bilateral foot drop PTSD ABIs in our clinic were 1.22 on the right 1.27 on the left 6/17; patient presents for follow-up. He has been using silver alginate to the bilateral buttocks wounds. These have healed. We have also been using silver alginate to the right lower extremity under compression therapy. He is tolerated this well. The wounds are smaller. 6/25; patient presents for follow-up. We have been using silver alginate to the right lower extremity under compression therapy. The anterior leg wound has healed. The lateral right leg wound is smaller. 7/8; patient presents for follow-up. We have been using silver alginate under compression therapy to the right lower extremity wound and this is healed. Unfortunately he has developed a blister to the left lower extremity. 7/15; patient presents for follow-up. We have been using silver alginate under 4-layer compression to the left lower extremity. His wound has healed here. Unfortunately he has developed blistering to the right lower extremity and has open wounds despite using compression therapy daily here. THOREN, HOSANG ([id number]) [id number] Page 2 of 10 Electronic Signature(s) Signed: 11/13/2022 4:51:26 PM By: Geralyn Corwin DO Entered By: Geralyn Corwin on 11/12/2022  15:51:12 -------------------------------------------------------------------------------- Physical Exam Details Patient Name: Date of Service: [name], [name] 11/12/2022 3:00 PM Medical Record Number: [id number] Patient Account  Number: [id number] Date of Birth/Sex: Treating RN: April 20, 1951 (72 y.o. M) Primary Care Provider: Everrett Coombe Other Clinician: Referring Provider: Treating Provider/Extender: Cheri Guppy in Treatment: 5 Constitutional respirations regular, non-labored and within target range for patient.. Cardiovascular 2+ dorsalis pedis/posterior tibialis pulses. Psychiatric pleasant and cooperative. Notes Right lower extremity: Scattered open wounds limited to skin breakdown. 3+ pitting edema to the thigh. Left lower extremity: T the anterior aspect there is epithelization and small scab to the previous wound site. 3+ pitting edema to the thigh. o Electronic Signature(s) Signed: 11/13/2022 4:51:26 PM By: Geralyn Corwin DO Entered By: Geralyn Corwin on 11/12/2022 15:51:57 -------------------------------------------------------------------------------- Physician Orders Details Patient Name: Date of Service: [name], [name] 11/12/2022 3:00 PM Medical Record Number: [id number] Patient Account Number: [id number] Date of Birth/Sex: Treating RN: 1950/10/22 (72 y.o. Marlan Palau Primary Care Provider: Everrett Coombe Other Clinician: Referring Provider: Treating Provider/Extender: Cheri Guppy in Treatment: 5 Verbal / Phone Orders: No Diagnosis Coding ICD-10 Coding Code Description [phone] Unspecified open wound, left lower leg, initial encounter I87.312 Chronic venous hypertension (idiopathic) with ulcer of left lower extremity L97.818 Non-pressure chronic ulcer of other part of right lower leg with other specified severity I89.0 Lymphedema, not elsewhere classified 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,  not elsewhere classified Follow-up Appointments ppointment in 1 week. - Dr. Mikey Bussing - room 8 Return A Other: - Please call your cardiologist about increasing your fluid pill dosage Anesthetic (In clinic) Topical Lidocaine 4% applied to wound bed - Used in [place], [place] ([id number]) [phone].pdf Page 3 of 10 Bathing/ Shower/ Hygiene May shower and wash wound with soap and water. - Please do not get right leg wet with compression wraps. Please keep wrap on all week. Can use Cast Protector-purchase at [employer] etc. Edema Control - Lymphedema / SCD / Other Bilateral Lower Extremities Elevate legs to the level of the heart or above for 30 minutes daily and/or when sitting for 3-4 times a day throughout the day. Avoid standing for long periods of time. Exercise regularly Moisturize legs daily. Compression stocking or Garment 30-40 mm/Hg pressure to: - thigh high compression stockings- sippers if available; qty 3 for both legs every 6 months for life. Other Edema Control Orders/Instructions: - tubigrip to right leg size E double layer wear until you arrive, remove and then place your compression stocking. If compression wraps slide down please call wound center and speak with a nurse. Wound Treatment Wound #7 - Lower Leg Wound Laterality: Right, Anterior Cleanser: Soap and Water 1 x Per Week/30 Days Discharge Instructions: May shower and wash wound with dial antibacterial soap and water prior to dressing change. Cleanser: Vashe 5.8 (oz) 1 x Per Week/30 Days Discharge Instructions: Cleanse the wound with Vashe prior to applying a clean dressing using gauze sponges, not tissue or cotton balls. Peri-Wound Care: Sween Lotion (Moisturizing lotion) 1 x Per Week/30 Days Discharge Instructions: Apply moisturizing lotion as directed Prim Dressing: Maxorb Extra Ag+ Alginate Dressing, 4x4.75 (in/in) 1 x Per Week/30 Days ary Discharge Instructions: Apply to wound bed  as instructed Secondary Dressing: ABD Pad, 8x10 1 x Per Week/30 Days Discharge Instructions: Apply over primary dressing as directed. Secondary Dressing: CarboFLEX Odor Control Dressing, 4x4 in 1 x Per Week/30 Days Discharge Instructions: Apply over primary dressing as directed. Compression Wrap: Urgo K2, (equivalent to a 4 layer) two layer compression system, regular 1 x Per Week/30 Days Discharge Instructions: Apply Urgo K2 as directed (alternative to 4 layer compression). Wound #8 -  Lower Leg Wound Laterality: Right, Medial Cleanser: Soap and Water 1 x Per Week/30 Days Discharge Instructions: May shower and wash wound with dial antibacterial soap and water prior to dressing change. Cleanser: Vashe 5.8 (oz) 1 x Per Week/30 Days Discharge Instructions: Cleanse the wound with Vashe prior to applying a clean dressing using gauze sponges, not tissue or cotton balls. Peri-Wound Care: Sween Lotion (Moisturizing lotion) 1 x Per Week/30 Days Discharge Instructions: Apply moisturizing lotion as directed Prim Dressing: Maxorb Extra Ag+ Alginate Dressing, 4x4.75 (in/in) 1 x Per Week/30 Days ary Discharge Instructions: Apply to wound bed as instructed Secondary Dressing: ABD Pad, 8x10 1 x Per Week/30 Days Discharge Instructions: Apply over primary dressing as directed. Secondary Dressing: CarboFLEX Odor Control Dressing, 4x4 in 1 x Per Week/30 Days Discharge Instructions: Apply over primary dressing as directed. Compression Wrap: Urgo K2, (equivalent to a 4 layer) two layer compression system, regular 1 x Per Week/30 Days Discharge Instructions: Apply Urgo K2 as directed (alternative to 4 layer compression). Wound #9 - Lower Leg Wound Laterality: Right, Lateral Cleanser: Soap and Water 1 x Per Week/30 Days Discharge Instructions: May shower and wash wound with dial antibacterial soap and water prior to dressing change. Cleanser: Vashe 5.8 (oz) 1 x Per Week/30 Days Discharge Instructions: Cleanse the  wound with Vashe prior to applying a clean dressing using gauze sponges, not tissue or cotton balls. Peri-Wound Care: Sween Lotion (Moisturizing lotion) 1 x Per Week/30 Days Discharge Instructions: Apply moisturizing lotion as directed Prim Dressing: Maxorb Extra Ag+ Alginate Dressing, 4x4.75 (in/in) 1 x Per Week/30 Days ary Discharge Instructions: Apply to wound bed as instructed Secondary Dressing: ABD Pad, 8x10 1 x Per Week/30 Days Discharge Instructions: Apply over primary dressing as directed. Secondary Dressing: CarboFLEX Odor Control Dressing, 4x4 in 1 x Per Week/30 Days Nokes, Graysen ([id number]) [phone].pdf Page 4 of 10 Discharge Instructions: Apply over primary dressing as directed. Compression Wrap: Urgo K2, (equivalent to a 4 layer) two layer compression system, regular 1 x Per Week/30 Days Discharge Instructions: Apply Urgo K2 as directed (alternative to 4 layer compression). Patient Medications llergies: benazepril, gabapentin, Hyrocodone, Shellfish Containing Products A Notifications Medication Indication Start End 11/12/2022 lidocaine DOSE topical 4 % cream - cream topical Electronic Signature(s) Signed: 11/13/2022 4:51:26 PM By: Geralyn Corwin DO Entered By: Geralyn Corwin on 11/12/2022 15:52:07 -------------------------------------------------------------------------------- Problem List Details Patient Name: Date of Service: SHAMARI, LOFQUIST 11/12/2022 3:00 PM Medical Record Number: [id number] Patient Account Number: [id number] Date of Birth/Sex: Treating RN: [birth year] (72 y.o. M) Primary Care Provider: Everrett Coombe Other Clinician: Referring Provider: Treating Provider/Extender: Cheri Guppy in Treatment: 5 Active Problems ICD-10 Encounter Code Description Active Date MDM Diagnosis S81.802A Unspecified open wound, left lower leg, initial encounter 11/05/2022 No Yes I87.312 Chronic venous hypertension  (idiopathic) with ulcer of left lower extremity 11/05/2022 No Yes L97.818 Non-pressure chronic ulcer of other part of right lower leg with other specified 10/04/2022 No Yes severity I89.0 Lymphedema, not elsewhere classified 10/04/2022 No Yes 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, not elsewhere classified 10/04/2022 No Yes Inactive Problems Resolved Problems ICD-10 Code Description Active Date Resolved Date L89.312 Pressure ulcer of right buttock, stage 2 10/04/2022 10/04/2022 L89.322 Pressure ulcer of left buttock, stage 2 10/04/2022 10/04/2022 [name] ([id number]) [phone].pdf Page 5 of 10 B37.0 Candidal stomatitis 10/04/2022 10/04/2022 Electronic Signature(s) Signed: 11/13/2022 4:51:26 PM By: Geralyn Corwin DO Entered By: Geralyn Corwin on 11/12/2022 15:50:20 -------------------------------------------------------------------------------- Progress Note Details Patient Name: Date of Service: [name] 11/12/2022  3:00 PM Medical Record Number: [id number] Patient Account Number: [id number] Date of Birth/Sex: Treating RN: 12-10-1950 (72 y.o. M) Primary Care Provider: Everrett Coombe Other Clinician: Referring Provider: Treating Provider/Extender: Cheri Guppy in Treatment: 5 Subjective Chief Complaint Information obtained from Patient 10/04/2022; patient is here for review of multiple wounds History of Present Illness (HPI) ADMISSION 10/04/2022 This is a 72 year old man who is quite disabled because of some form of myopathy. He follows for this with neurology. He has had biopsies that showed necrosis without inflammation. 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 In any case he is reasonably immobile at home. He has this apparatus to help him transfer. Because of difficulties getting him upstairs he has been sleeping in a recliner on  the main floor. He also has a history of lymphedema and I see he was followed by PT for a period of time. He has a history of combined congestive heart failure but his last echo showed an EF of 60 to 65%. He has developed a wound on the right lateral leg over the last month or so. He also has a smaller wound on the right anterior lower leg. He is having problems with his left second toe and he has bilateral stage II ulcers over his ischial tuberosity. The patient is complaining of oral pain under his tongue. He has bilateral heel pain and increasing edema with weight gain over the past week or 2 Past medical history includes myopathy/myositis question inclusion body myositis, rheumatoid arthritis, coronary artery disease, massive pulmonary embolism, type 2 diabetes, lymphedema, combined congestive heart failure, bilateral foot drop PTSD ABIs in our clinic were 1.22 on the right 1.27 on the left 6/17; patient presents for follow-up. He has been using silver alginate to the bilateral buttocks wounds. These have healed. We have also been using silver alginate to the right lower extremity under compression therapy. He is tolerated this well. The wounds are smaller. 6/25; patient presents for follow-up. We have been using silver alginate to the right lower extremity under compression therapy. The anterior leg wound has healed. The lateral right leg wound is smaller. 7/8; patient presents for follow-up. We have been using silver alginate under compression therapy to the right lower extremity wound and this is healed. Unfortunately he has developed a blister to the left lower extremity. 7/15; patient presents for follow-up. We have been using silver alginate under 4-layer compression to the left lower extremity. His wound has healed here. Unfortunately he has developed blistering to the right lower extremity and has open wounds despite using compression therapy daily here. Patient History Information  obtained from Patient, Chart. Social History Never smoker, Marital Status - Married, Alcohol Use - Never, Drug Use - No History, Caffeine Use - Daily - coffee. Medical History Hematologic/Lymphatic Patient has history of Anemia, Lymphedema - Bilateral Lower legs Cardiovascular Patient has history of Congestive Heart Failure, Deep Vein Thrombosis Endocrine Patient has history of Type II Diabetes Musculoskeletal Patient has history of Rheumatoid Arthritis Hospitalization/Surgery History - Biopsy left Shoulder; Angiogram Pulmonary Bilateral. Dowlen, Salmaan ([id number]) [id number] Page 6 of 10 Medical A Surgical History Notes nd Respiratory Hx: Pulmonary Embolism Gastrointestinal GERD Endocrine Hx: Hypothyroidism Genitourinary UU:[id number] Immunological QQ:VZDGLOVFIE disease Musculoskeletal Hx: Necrotizing Myopathy Psychiatric PTSD Objective Constitutional respirations regular, non-labored and within target range for patient.. Vitals Time Taken: 3:37 PM, Height: 68 in, Weight: 220 lbs, BMI: 33.4, Temperature: 98.5  F, Pulse: 68 bpm, Respiratory Rate: 18 breaths/min, Blood Pressure: 125/71 mmHg. Cardiovascular 2+ dorsalis pedis/posterior tibialis pulses. Psychiatric pleasant and cooperative. General Notes: Right lower extremity: Scattered open wounds limited to skin breakdown. 3+ pitting edema to the thigh. Left lower extremity: T the anterior o aspect there is epithelization and small scab to the previous wound site. 3+ pitting edema to the thigh. Integumentary (Hair, Skin) Wound #6 status is Healed - Epithelialized. Original cause of wound was Blister. The date acquired was: 11/05/2022. The wound has been in treatment 1 weeks. The wound is located on the Left,Anterior Lower Leg. The wound measures 0cm length x 0cm width x 0cm depth; 0cm^2 area and 0cm^3 volume. There is no tunneling or undermining noted. There is a none present amount of drainage  noted. The wound margin is distinct with the outline attached to the wound base. There is no granulation within the wound bed. There is no necrotic tissue within the wound bed. The periwound skin appearance did not exhibit: Callus, Crepitus, Excoriation, Induration, Rash, Scarring, Dry/Scaly, Maceration, Atrophie Blanche, Cyanosis, Ecchymosis, Hemosiderin Staining, Mottled, Pallor, Rubor, Erythema. Wound #7 status is Open. Original cause of wound was Blister. The date acquired was: 11/06/2022. The wound is located on the Right,Anterior Lower Leg. The wound measures 2cm length x 2.1cm width x 0.1cm depth; 3.299cm^2 area and 0.33cm^3 volume. There is Fat Layer (Subcutaneous Tissue) exposed. There is no tunneling or undermining noted. There is a medium amount of serous drainage noted. The wound margin is distinct with the outline attached to the wound base. There is large (67-100%) red granulation within the wound bed. There is a small (1-33%) amount of necrotic tissue within the wound bed including Adherent Slough. The periwound skin appearance had no abnormalities noted for texture. The periwound skin appearance had no abnormalities noted for moisture. The periwound skin appearance had no abnormalities noted for color. Periwound temperature was noted as No Abnormality. Wound #8 status is Open. Original cause of wound was Blister. The date acquired was: 11/06/2022. The wound is located on the Right,Medial Lower Leg. The wound measures 0.5cm length x 1cm width x 0.1cm depth; 0.393cm^2 area and 0.039cm^3 volume. There is Fat Layer (Subcutaneous Tissue) exposed. There is no tunneling or undermining noted. There is a medium amount of serous drainage noted. The wound margin is distinct with the outline attached to the wound base. There is large (67-100%) red granulation within the wound bed. There is no necrotic tissue within the wound bed. The periwound skin appearance had no abnormalities noted for texture. The  periwound skin appearance had no abnormalities noted for moisture. The periwound skin appearance had no abnormalities noted for color. Periwound temperature was noted as No Abnormality. Wound #9 status is Open. Original cause of wound was Blister. The date acquired was: 11/06/2022. The wound is located on the Right,Lateral Lower Leg. The wound measures 0.7cm length x 2cm width x 0.1cm depth; 1.1cm^2 area and 0.11cm^3 volume. There is Fat Layer (Subcutaneous Tissue) exposed. There is no tunneling or undermining noted. There is a medium amount of serous drainage noted. The wound margin is distinct with the outline attached to the wound base. There is large (67-100%) red granulation within the wound bed. There is no necrotic tissue within the wound bed. The periwound skin appearance had no abnormalities noted for texture. The periwound skin appearance had no abnormalities noted for moisture. The periwound skin appearance had no abnormalities noted for color. Periwound temperature was noted as No Abnormality. Assessment Active  Problems ICD-10 Unspecified open wound, left lower leg, initial encounter Chronic venous hypertension (idiopathic) with ulcer of left lower extremity Non-pressure chronic ulcer of other part of right lower leg with other specified severity Lymphedema, not elsewhere classified Other inflammatory and immune myopathies, not elsewhere classified Poppell, Kendre ([id number]) [id number] Page 7 of 10 Patient has done well with 4-layer compression and silver alginate to the left leg. His wound is healed here. Unfortunately he has developed blistering to the right lower extremity again and has open wounds. I recommended silver alginate and 4-layer compression here. He can use his compression garment on the left leg. Upon further med review he is taking 20 mg of torsemide. I recommended he talk with his cardiologist or PCP to discuss a diuretic regiment. He  has significant edema on exam and compression therapy is not enough. He expressed understanding. We will see him back in 1 week. Procedures Wound #7 Pre-procedure diagnosis of Wound #7 is a Lymphedema located on the Right,Anterior Lower Leg . There was a Double Layer Compression Therapy Procedure by Samuella Bruin, RN. Post procedure Diagnosis Wound #7: Same as Pre-Procedure Plan Follow-up Appointments: Return Appointment in 1 week. - Dr. Mikey Bussing - room 8 Other: - Please call your cardiologist about increasing your fluid pill dosage Anesthetic: (In clinic) Topical Lidocaine 4% applied to wound bed - Used in Clinic Bathing/ Shower/ Hygiene: May shower and wash wound with soap and water. - Please do not get right leg wet with compression wraps. Please keep wrap on all week. Can use Cast Protector-purchase at [employer] etc. Edema Control - Lymphedema / SCD / Other: Elevate legs to the level of the heart or above for 30 minutes daily and/or when sitting for 3-4 times a day throughout the day. Avoid standing for long periods of time. Exercise regularly Moisturize legs daily. Compression stocking or Garment 30-40 mm/Hg pressure to: - thigh high compression stockings- sippers if available; qty 3 for both legs every 6 months for life. Other Edema Control Orders/Instructions: - tubigrip to right leg size E double layer wear until you arrive, remove and then place your compression stocking. If compression wraps slide down please call wound center and speak with a nurse. The following medication(s) was prescribed: lidocaine topical 4 % cream cream topical was prescribed at facility WOUND #7: - Lower Leg Wound Laterality: Right, Anterior Cleanser: Soap and Water 1 x Per Week/30 Days Discharge Instructions: May shower and wash wound with dial antibacterial soap and water prior to dressing change. Cleanser: Vashe 5.8 (oz) 1 x Per Week/30 Days Discharge Instructions: Cleanse the wound with  Vashe prior to applying a clean dressing using gauze sponges, not tissue or cotton balls. Peri-Wound Care: Sween Lotion (Moisturizing lotion) 1 x Per Week/30 Days Discharge Instructions: Apply moisturizing lotion as directed Prim Dressing: Maxorb Extra Ag+ Alginate Dressing, 4x4.75 (in/in) 1 x Per Week/30 Days ary Discharge Instructions: Apply to wound bed as instructed Secondary Dressing: ABD Pad, 8x10 1 x Per Week/30 Days Discharge Instructions: Apply over primary dressing as directed. Secondary Dressing: CarboFLEX Odor Control Dressing, 4x4 in 1 x Per Week/30 Days Discharge Instructions: Apply over primary dressing as directed. Com pression Wrap: Urgo K2, (equivalent to a 4 layer) two layer compression system, regular 1 x Per Week/30 Days Discharge Instructions: Apply Urgo K2 as directed (alternative to 4 layer compression). WOUND #8: - Lower Leg Wound Laterality: Right, Medial Cleanser: Soap and Water 1 x Per Week/30 Days Discharge Instructions: May shower and wash wound with dial antibacterial  soap and water prior to dressing change. Cleanser: Vashe 5.8 (oz) 1 x Per Week/30 Days Discharge Instructions: Cleanse the wound with Vashe prior to applying a clean dressing using gauze sponges, not tissue or cotton balls. Peri-Wound Care: Sween Lotion (Moisturizing lotion) 1 x Per Week/30 Days Discharge Instructions: Apply moisturizing lotion as directed Prim Dressing: Maxorb Extra Ag+ Alginate Dressing, 4x4.75 (in/in) 1 x Per Week/30 Days ary Discharge Instructions: Apply to wound bed as instructed Secondary Dressing: ABD Pad, 8x10 1 x Per Week/30 Days Discharge Instructions: Apply over primary dressing as directed. Secondary Dressing: CarboFLEX Odor Control Dressing, 4x4 in 1 x Per Week/30 Days Discharge Instructions: Apply over primary dressing as directed. Com pression Wrap: Urgo K2, (equivalent to a 4 layer) two layer compression system, regular 1 x Per Week/30 Days Discharge  Instructions: Apply Urgo K2 as directed (alternative to 4 layer compression). WOUND #9: - Lower Leg Wound Laterality: Right, Lateral Cleanser: Soap and Water 1 x Per Week/30 Days Discharge Instructions: May shower and wash wound with dial antibacterial soap and water prior to dressing change. Cleanser: Vashe 5.8 (oz) 1 x Per Week/30 Days Discharge Instructions: Cleanse the wound with Vashe prior to applying a clean dressing using gauze sponges, not tissue or cotton balls. Peri-Wound Care: Sween Lotion (Moisturizing lotion) 1 x Per Week/30 Days Discharge Instructions: Apply moisturizing lotion as directed Prim Dressing: Maxorb Extra Ag+ Alginate Dressing, 4x4.75 (in/in) 1 x Per Week/30 Days ary Discharge Instructions: Apply to wound bed as instructed Secondary Dressing: ABD Pad, 8x10 1 x Per Week/30 Days Discharge Instructions: Apply over primary dressing as directed. Secondary Dressing: CarboFLEX Odor Control Dressing, 4x4 in 1 x Per Week/30 Days Discharge Instructions: Apply over primary dressing as directed. Com pression Wrap: Urgo K2, (equivalent to a 4 layer) two layer compression system, regular 1 x Per Week/30 Days Discharge Instructions: Apply Urgo K2 as directed (alternative to 4 layer compression). JAHZIEL, SINN ([id number]) [id number] Page 8 of 10 1. Silver alginate under 4-layer compressionright lower extremity 2. Compression garment daily to the left lower extremity 3. Elevate legs when sitting 4. Follow-up with PCP/cardiologist for diuretic regiment 5. Follow-up in 1 week Electronic Signature(s) Signed: 11/13/2022 4:51:26 PM By: Geralyn Corwin DO Entered By: Geralyn Corwin on 11/12/2022 15:54:34 -------------------------------------------------------------------------------- HxROS Details Patient Name: Date of Service: TORRES, HARDENBROOK 11/12/2022 3:00 PM Medical Record Number: [id number] Patient Account Number: [id number] Date of Birth/Sex:  Treating RN: February 07, 1951 (72 y.o. M) Primary Care Provider: Everrett Coombe Other Clinician: Referring Provider: Treating Provider/Extender: Cheri Guppy in Treatment: 5 Information Obtained From Patient Chart Hematologic/Lymphatic Medical History: Positive for: Anemia; Lymphedema - Bilateral Lower legs Respiratory Medical History: Past Medical History Notes: Hx: Pulmonary Embolism Cardiovascular Medical History: Positive for: Congestive Heart Failure; Deep Vein Thrombosis Gastrointestinal Medical History: Past Medical History Notes: GERD Endocrine Medical History: Positive for: Type II Diabetes Past Medical History Notes: Hx: Hypothyroidism Time with diabetes: 33 years Treated with: Insulin Genitourinary Medical History: Past Medical History Notes: NW:GNFAOZHYQMVH Immunological Medical History: Past Medical History Notes: QI:ONGEXBMWUX disease Musculoskeletal Medical History: Positive for: Rheumatoid Arthritis [place], [name] ([id number]) [id number] Page 9 of 10 Past Medical History Notes: Hx: Necrotizing Myopathy Psychiatric Medical History: Past Medical History Notes: PTSD Immunizations Pneumococcal Vaccine: Received Pneumococcal Vaccination: Yes Received Pneumococcal Vaccination On or After 60th Birthday: No Implantable Devices None Hospitalization / Surgery History Type of Hospitalization/Surgery Biopsy left Shoulder; Angiogram Pulmonary Bilateral Family and Social History Never smoker; Marital Status - Married; Alcohol Use: Never; Drug Use: No History;  Caffeine Use: Daily - coffee; Financial Concerns: No; Food, Clothing or Shelter Needs: No; Support System Lacking: No; Transportation Concerns: No Electronic Signature(s) Signed: 11/13/2022 4:51:26 PM By: Geralyn Corwin DO Entered By: Geralyn Corwin on 11/12/2022  15:51:17 -------------------------------------------------------------------------------- SuperBill Details Patient Name: Date of Service: [name] 11/12/2022 Medical Record Number: [id number] Patient Account Number: [id number] Date of Birth/Sex: Treating RN: August 08, 1950 (72 y.o. M) Primary Care Provider: Everrett Coombe Other Clinician: Referring Provider: Treating Provider/Extender: Cheri Guppy in Treatment: 5 Diagnosis Coding ICD-10 Codes Code Description [phone] Unspecified open wound, left lower leg, initial encounter I87.312 Chronic venous hypertension (idiopathic) with ulcer of left lower extremity L97.818 Non-pressure chronic ulcer of other part of right lower leg with other specified severity I89.0 Lymphedema, not elsewhere classified 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, not elsewhere classified Facility Procedures : CPT4 Code: [id number] Description: (Facility Use Only) [phone] - APPLY MULTLAY COMPRS LWR RT LEG ICD-10 Diagnosis Description I89.0 Lymphedema, not elsewhere classified Modifier: Quantity: 1 Physician Procedures : CPT4 Code Description Modifier [id number] - WC PHYS LEVEL 3 - EST PT ICD-10 Diagnosis Description L97.818 Non-pressure chronic ulcer of other part of right lower leg with other specified severity I87.312 Chronic venous hypertension (idiopathic) with  ulcer of left lower extremity I89.0 Lymphedema, not elsewhere classified Maglione, Oluwatosin ([id number]) [id number] Pag Quantity: 1 e 10 of 10 Electronic Signature(s) Signed: 11/12/2022 4:41:34 PM By: Samuella Bruin Signed: 11/13/2022 4:51:26 PM By: Geralyn Corwin DO Entered By: Samuella Bruin on 11/12/2022 16:31:24

## 2022-11-14 DIAGNOSIS — R3915 Urgency of urination: Secondary | ICD-10-CM | POA: Diagnosis not present

## 2022-11-14 DIAGNOSIS — Z125 Encounter for screening for malignant neoplasm of prostate: Secondary | ICD-10-CM | POA: Diagnosis not present

## 2022-11-15 ENCOUNTER — Ambulatory Visit: Payer: Medicare Other | Admitting: Physical Therapy

## 2022-11-15 DIAGNOSIS — M6281 Muscle weakness (generalized): Secondary | ICD-10-CM | POA: Diagnosis not present

## 2022-11-15 DIAGNOSIS — R262 Difficulty in walking, not elsewhere classified: Secondary | ICD-10-CM

## 2022-11-15 DIAGNOSIS — R29898 Other symptoms and signs involving the musculoskeletal system: Secondary | ICD-10-CM | POA: Diagnosis not present

## 2022-11-15 NOTE — Therapy (Addendum)
 OUTPATIENT PHYSICAL THERAPY LOWER EXTREMITY TREATMENT AND DISCHARGE  PHYSICAL THERAPY DISCHARGE SUMMARY  Visits from Start of Care: 5  Current functional level related to goals / functional outcomes: Continued to strengthen for balance and gait.    Remaining deficits: Continued weakness in bilat LEs   Education / Equipment: See below   Patient agrees to discharge. Patient goals were not met. Patient is being discharged due to not returning since the last visit.   Patient Name: [name] MRN: [id number] DOB:02/02/1951, 72 y.o., male Today's Date: 11/15/2022  END OF SESSION:    Past Medical History:  Diagnosis Date   Autoimmune disease (HCC)    Bronchitis    Chronic HFrEF (heart failure with reduced ejection fraction) (HCC)    normalized EF   Chronic hiccups    Chronic kidney disease, stage 3a (HCC)    DM2 (diabetes mellitus, type 2) (HCC) 09/28/2014   DVT (deep venous thrombosis) (HCC)    GERD (gastroesophageal reflux disease)    Hypogonadism in male 04/01/2018   Hypothyroidism    Joint pain    Necrotizing myopathy    Nonobstructive atherosclerosis of coronary artery    Posttraumatic stress disorder 01/30/2022   PTSD (post-traumatic stress disorder)    Pulmonary embolism (HCC)    Rheumatoid arthritis (HCC) 08/09/2021   RSV (respiratory syncytial virus pneumonia) 03/11/2022   Past Surgical History:  Procedure Laterality Date   BIOPSY SHOULDER     Left   IR ANGIOGRAM PULMONARY BILATERAL SELECTIVE  07/22/2021   IR THROMBECT VENO MECH MOD SED  07/22/2021   IR US GUIDE VASC ACCESS LEFT  07/22/2021   RADIOLOGY WITH ANESTHESIA Right 07/22/2021   Procedure: IR WITH ANESTHESIA;  Surgeon: Radiologist, Medication, MD;  Location: [facility] OR;  Service: Radiology;  Laterality: Right;   UPPER LEG SOFT TISSUE BIOPSY Right    Patient Active Problem List   Diagnosis Date Noted   Lymphedema 09/13/2022   Syncope and collapse 09/13/2022   Syncope 09/12/2022   Venous stasis ulcer  without varicose veins (HCC) 09/09/2022   Anemia 09/09/2022   Skin ulcer of buttock (HCC) 09/09/2022   Generalized weakness 09/09/2022   Diarrhea 09/09/2022   Oropharyngeal dysphagia 04/23/2022   Leg swelling 04/23/2022   Confusion 04/23/2022   Type 2 diabetes mellitus with other diabetic ophthalmic complication (HCC) 04/16/2022   Shoulder pain 03/22/2022   Hypoglycemia 03/20/2022   Frequent falls 03/19/2022   Chronic combined systolic and diastolic CHF (congestive heart failure) (HCC) 03/11/2022   CKD (chronic kidney disease), stage III (HCC) 03/11/2022   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   History of DVT (deep vein thrombosis) 03/11/2022   History of pulmonary embolism 03/11/2022   Hyperlipidemia 03/11/2022   Obesity (BMI 30-39.9) 03/11/2022   CAD (coronary artery disease) 01/30/2022   Depression 01/30/2022   Cardiomyopathy (HCC) 01/30/2022   Posttraumatic stress disorder 01/30/2022   Rheumatoid arthritis (HCC) 08/09/2021   New onset of headaches after age 89 08/09/2021   Non-ST elevation (NSTEMI) myocardial infarction [facility])    Acute pulmonary embolism (HCC) 07/22/2021   Acute DVT (deep venous thrombosis) (HCC) 07/22/2021   Elevated troponin 07/22/2021   Anxiety 07/22/2021   Ankle swelling 01/17/2020   GERD (gastroesophageal reflux disease) 06/04/2018   Hypogonadism in male 04/01/2018   Macrocytic anemia 09/30/2017   Itching 09/30/2017   Hypothyroidism 09/30/2017   Low testosterone 09/30/2017   Benign prostatic hyperplasia without lower urinary tract symptoms 09/30/2017   Erectile dysfunction 03/19/2016   Benign essential HTN 08/08/2015   Branch retinal  vein occlusion with macular edema of left eye 03/25/2015   Nuclear sclerotic cataract of both eyes 03/25/2015   Myositis 09/28/2014   DM2 (diabetes mellitus, type 2) (HCC) 09/28/2014   Abnormal glucose 10/04/2012    PCP: Everrett Coombe, DO  REFERRING PROVIDER: Everrett Coombe, DO  REFERRING DIAG: R53.1 (ICD-10-CM) -  Generalized weakness  THERAPY DIAG:  No diagnosis found.  Rationale for Evaluation and Treatment: Rehabilitation  ONSET DATE: worsening for ~1 year  SUBJECTIVE:   SUBJECTIVE STATEMENT: Wife states blisters still have not gone away. Pt's LEs continue to swell. Pt notes just weakness but no pain.   Eval: Pt has necrotizing myopathy (was in the [employer] and exposed to [employer]). Wife states his muscles are shredded and he has lymphedema. Wife reports L side is weaker. No CVA, seizure or heart attacks. Pt has a bike at home that he works on but no real exercise regimen. He has had PT in the past but has not maintained. Has wound on R LE from the lymphedema. Pt is having a hard time walking up stairs. Used to be able to use cane. Has been using a walker as both feet have drop foot. Has not been able to sleep upstairs. Pt reports 4 hospital admissions in the past year. Pt states he is only able to perform static standing for 1 min.   PERTINENT HISTORY: history of autoimmune myopathy, rheumatoid arthritis, diabetes mellitus type 2, DVT and PE on Eliquis, chronic combined CHF, nonobstructive CAD, chronic lymphedema, CKD stage IIIa, OSA, GERD, hypothyroidism  PAIN:  Are you having pain? Yes: NPRS scale:  currently 0, up to 15/10 Pain location: Mostly in knees Pain description: aching Aggravating factors: intermittent Relieving factors: tylenol  PRECAUTIONS: None  WEIGHT BEARING RESTRICTIONS: No  FALLS:  Has patient fallen in last 6 months? Yes. Number of falls 5 -- happened during walking  LIVING ENVIRONMENT: Lives with: lives with their family and lives with their spouse son is home from college Lives in: House/apartment Stairs: Yes: Internal: 17 steps; on right going up and External: 4 in garage, 5 in front steps; none Has following equipment at home: Quad cane large base and manual wheelchair/rollator convertible  OCCUPATION: Retired Hotel manager  PLOF: Independent  PATIENT  GOALS: Walk with the cane inside the house, less assist to go up/down stairs  NEXT MD VISIT: n/a  OBJECTIVE: (Measures in this section from initial evaluation unless otherwise noted)  DIAGNOSTIC FINDINGS: nothing recent  PATIENT SURVEYS:  LEFS 10%   SENSATION: Notes history of neuropathy  EDEMA:  Lymphedema bilat   MUSCLE LENGTH: did not assess  POSTURE: rounded shoulders, increased thoracic kyphosis, and flexed trunk   PALPATION: Did not assess  LOWER EXTREMITY ROM:  Active ROM Right eval Left eval  Hip flexion    Hip extension    Hip abduction    Hip adduction    Hip internal rotation    Hip external rotation    Knee flexion    Knee extension    Ankle dorsiflexion    Ankle plantarflexion    Ankle inversion    Ankle eversion     (Blank rows = not tested)  LOWER EXTREMITY MMT:  MMT Right eval Left eval  Hip flexion 2+ 3-  Hip extension    Hip abduction 4- 4-  Hip adduction 4- 5  Hip internal rotation    Hip external rotation    Knee flexion 3+ 3+  Knee extension 2+ 3-  Ankle dorsiflexion  Ankle plantarflexion    Ankle inversion    Ankle eversion     (Blank rows = not tested)  LOWER EXTREMITY SPECIAL TESTS:  Did not assess  FUNCTIONAL TESTS:  5 times sit to stand: 40 sec TUG: 54 sec  GAIT: Distance walked: 100' Assistive device utilized: Environmental consultant - 4 wheeled Level of assistance: SBA Comments: Increased trunk flexion, decreased heel strike   TODAY'S TREATMENT:  [facility] Adult PT Treatment:                                                DATE: 11/15/22 Therapeutic Exercise: Seated Marching x20 alternating B LAQ x20 alternating B Hamstring stretch x30 sec B Diagonal chops yellow med ball 2x10 "V" yellow med ball 2x10 Heel/toe raise x20 B Low range 1" hover sit<>stand lift off x4 UE support on knees Raised plinth sit<>stand x 5 UE support on knees Standing Glute set with UE support x10, no UE support x10 Neuromuscular  re-ed: Standing Side stepping with UE support x10 R&L Butt kicks 2x10 Staggered stance with decreasing UE support weight shifting fwd/bwd 3x10 R&L Amb x50' rollator CGA    [facility] Adult PT Treatment:                                                DATE: 11/08/2022 Therapeutic Activity: Seated on airex: Knee extension, focus on upright posture Marching + opposite arm lift HS stretch w/strap (foot on stool) A/PPT --> APT + scap squeeze 3x10 sec D2 UE RTB x10 (B) Standing" Bicep curls GTB x10 3#DB cross punches Fwd walking with rollator x80' (start), x80' (end) Seated anterior weight shifting (CGA/min A) to improve STS transfers Self Care: Wound care (blister popped)   [facility] Adult PT Treatment:                                                DATE: 10/30/22 Therapeutic Exercise: Seated heel slide for warm up x10 Seated hamstring stretch x 30 sec Seated figure 4 stretch x 30 sec Seated anterior/posterior pelvic tilting x10 Seated anterior pelvic tilt with scap squeeze 3x10 sec Seated pball press down 10x5 sec Seated pball press down + LAQ 2x10 Seated marching 2x10 Standing gastroc stretch x30 sec R&L Standing glute set 2x10 Gait: 1 1/4 laps around gym with rollator CGA for safety; limited due to fatigue, small shuffling steps with decreased heel strike due to drop foot   [facility] Adult PT Treatment:                                                DATE: 10/25/2022 Therapeutic Activity: STS minA-CGA Standing lateral weight shifting Standing marching Walking CGA (4WW) 2x80" Seated on airex: Heels slides LAQs Resisted marching (RTB above knees) x10, no resistance x10  Glute set 10x3" Supine (head elevated with pillows): LTR Assisted SLR Min A sit <--> supine   PATIENT EDUCATION:  Education details: Updatedl HEP Person educated: Patient and Spouse Education method:  Explanation, Demonstration, and Handouts Education comprehension: verbalized understanding, returned demonstration,  and needs further education  HOME EXERCISE PROGRAM: Access Code: [email or web address] URL: [email or web address] Date: 10/25/2022 Prepared by: Carlynn Herald  Exercises - Seated Heel Slide  - 1 x daily - 7 x weekly - 2-3 sets - 10 reps - Seated Long Arc Quad  - 1 x daily - 7 x weekly - 2-3 sets - 10 reps - Seated Ankle Pumps  - 1 x daily - 7 x weekly - 2-3 sets - 10 reps - Seated Isometric Hip Adduction with Ball  - 1 x daily - 7 x weekly - 2-3 sets - 10 reps - 5 sec hold - Seated Hip Abduction with Resistance  - 1 x daily - 7 x weekly - 2-3 sets - 10 reps - 5 sec hold - Standing March with Counter Support  - 1 x daily - 7 x weekly - 3 sets - 10 reps - Seated March  - 1 x daily - 7 x weekly - 3 sets - 10 reps - Seated Gluteal Sets  - 1 x daily - 7 x weekly - 3 sets - 10 reps - 3-5 sec hold  ASSESSMENT:  CLINICAL IMPRESSION: Worked on dynamic standing balance this session. Continued core and hip strengthening in seated with good tolerance.   Eval: Patient is a 72 y.o. M who was seen today for physical therapy evaluation and treatment for generalized weakness. PMH significant for lymphedema, neuropathy and Agent [employer] exposure leading to myopathy. Assessment demos L UE weakness with R LE weakness leading to decreased balance, standing and walking tolerance. Pt demos high fall risk based on his 5x STS and TUG score. Pt would benefit from PT to improve his home mobility and improve limited community mobility  OBJECTIVE IMPAIRMENTS: Abnormal gait, decreased activity tolerance, decreased balance, decreased endurance, decreased mobility, difficulty walking, decreased ROM, decreased strength, increased muscle spasms, impaired flexibility, improper body mechanics, postural dysfunction, and pain.    GOALS: Goals reviewed with patient? Yes  SHORT TERM GOALS: Target date: 11/06/2022  Pt will be ind with initial HEP Baseline: Goal status: INITIAL  2.  Pt will have improved 5x STS to </=35 sec  without UE support to demo increased functional LE strength Baseline:  Goal status: INITIAL  3.  Pt will have TUG time to </=50 sec with rollator to demo improving gait Baseline:  Goal status: INITIAL   LONG TERM GOALS: Target date: 12/04/2022   Pt will be ind with management and progression of HEP Baseline:  Goal status: INITIAL  2.  Pt will have TUG time to </=50 sec with SPC to demo improving gait for home Baseline:  Goal status: INITIAL  3.  Pt will be able to amb at least 400' with [facility] for home mobility Baseline:  Goal status: INITIAL  4.  Pt will be able to ascend/descend 17 steps with SPC to get to his bedroom  Baseline:  Goal status: INITIAL  5.  Pt will have improved LEFS to 20% to demo MCID Baseline:  Goal status: INITIAL   PLAN:  PT FREQUENCY: 2x/week  PT DURATION: 8 weeks  PLANNED INTERVENTIONS: Therapeutic exercises, Therapeutic activity, Neuromuscular re-education, Balance training, Gait training, Patient/Family education, Self Care, Joint mobilization, Stair training, Orthotic/Fit training, Aquatic Therapy, Dry Needling, Electrical stimulation, Cryotherapy, Moist heat, Taping, Vasopneumatic device, Ionotophoresis 4mg /ml Dexamethasone, Manual therapy, and Re-evaluation  PLAN FOR NEXT SESSION: Continue to strengthen bilat LE and core as tolerated. Continue balance/stability exercises. Work on gait/endurance.  Alaena Strader April Ma L Teigan Sahli, PT 11/15/2022, 2:36 PM

## 2022-11-19 ENCOUNTER — Encounter (HOSPITAL_BASED_OUTPATIENT_CLINIC_OR_DEPARTMENT_OTHER): Payer: Medicare Other | Admitting: Internal Medicine

## 2022-11-19 DIAGNOSIS — I87311 Chronic venous hypertension (idiopathic) with ulcer of right lower extremity: Secondary | ICD-10-CM

## 2022-11-19 DIAGNOSIS — L89322 Pressure ulcer of left buttock, stage 2: Secondary | ICD-10-CM | POA: Diagnosis not present

## 2022-11-19 DIAGNOSIS — E11621 Type 2 diabetes mellitus with foot ulcer: Secondary | ICD-10-CM | POA: Diagnosis not present

## 2022-11-19 DIAGNOSIS — I890 Lymphedema, not elsewhere classified: Secondary | ICD-10-CM | POA: Diagnosis not present

## 2022-11-19 DIAGNOSIS — L97818 Non-pressure chronic ulcer of other part of right lower leg with other specified severity: Secondary | ICD-10-CM | POA: Diagnosis not present

## 2022-11-19 DIAGNOSIS — L89312 Pressure ulcer of right buttock, stage 2: Secondary | ICD-10-CM | POA: Diagnosis not present

## 2022-11-19 NOTE — Progress Notes (Addendum)
[name], [name] ([id number]) [id number] Page 1 of 9 Visit Report for 11/19/2022 Chief Complaint Document Details Patient Name: Date of Service: [name], [name] 11/19/2022 3:45 PM Medical Record Number: [id number] Patient Account Number: [id number] Date of Birth/Sex: Treating RN: 1950/10/31 (72 y.o. M) Primary Care Provider: Everrett Coombe Other Clinician: Referring Provider: Treating Provider/Extender: Cheri Guppy in Treatment: 6 Information Obtained from: Patient Chief Complaint 10/04/2022; patient is here for review of multiple wounds Electronic Signature(s) Signed: 11/19/2022 4:55:26 PM By: Geralyn Corwin DO Entered By: Geralyn Corwin on 11/19/2022 16:48:30 -------------------------------------------------------------------------------- HPI Details Patient Name: Date of Service: [name], [name] 11/19/2022 3:45 PM Medical Record Number: [id number] Patient Account Number: [id number] Date of Birth/Sex: Treating RN: [birth year] (72 y.o. M) Primary Care Provider: Everrett Coombe Other Clinician: Referring Provider: Treating Provider/Extender: Cheri Guppy in Treatment: 6 History of Present Illness HPI Description: ADMISSION 10/04/2022 This is a 72 year old man who is quite disabled because of some form of myopathy. He follows for this with neurology. He has had biopsies that showed necrosis without inflammation. 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 In any case he is reasonably immobile at home. He has this apparatus to help him transfer. Because of difficulties getting him upstairs he has been sleeping in a recliner on the main floor. He also has a history of lymphedema and I see he was followed by PT for a period of time. He has a history of combined congestive heart failure but his last echo showed an EF of 60 to  65%. He has developed a wound on the right lateral leg over the last month or so. He also has a smaller wound on the right anterior lower leg. He is having problems with his left second toe and he has bilateral stage II ulcers over his ischial tuberosity. The patient is complaining of oral pain under his tongue. He has bilateral heel pain and increasing edema with weight gain over the past week or 2 Past medical history includes myopathy/myositis question inclusion body myositis, rheumatoid arthritis, coronary artery disease, massive pulmonary embolism, type 2 diabetes, lymphedema, combined congestive heart failure, bilateral foot drop PTSD ABIs in our clinic were 1.22 on the right 1.27 on the left 6/17; patient presents for follow-up. He has been using silver alginate to the bilateral buttocks wounds. These have healed. We have also been using silver alginate to the right lower extremity under compression therapy. He is tolerated this well. The wounds are smaller. 6/25; patient presents for follow-up. We have been using silver alginate to the right lower extremity under compression therapy. The anterior leg wound has healed. The lateral right leg wound is smaller. 7/8; patient presents for follow-up. We have been using silver alginate under compression therapy to the right lower extremity wound and this is healed. Unfortunately he has developed a blister to the left lower extremity. 7/15; patient presents for follow-up. We have been using silver alginate under 4-layer compression to the left lower extremity. His wound has healed here. Unfortunately he has developed blistering to the right lower extremity and has open wounds despite using compression therapy daily here. [name], [name] ([id number]) [id number] Page 2 of 9 7/22; patient presents for follow-up. We have been using silver alginate under 4-layer compression to the right lower extremity. He has been using his  zipper compression wrap to the left lower extremity daily. 2 wounds have healed on  the right lower extremity. He has 1 remaining wound to the anterior aspect. He has no issues or complaints today. Electronic Signature(s) Signed: 11/19/2022 4:55:26 PM By: Geralyn Corwin DO Entered By: Geralyn Corwin on 11/19/2022 16:50:26 -------------------------------------------------------------------------------- Physical Exam Details Patient Name: Date of Service: [name], [name] 11/19/2022 3:45 PM Medical Record Number: [id number] Patient Account Number: [id number] Date of Birth/Sex: Treating RN: Jul 03, 1950 (72 y.o. M) Primary Care Provider: Everrett Coombe Other Clinician: Referring Provider: Treating Provider/Extender: Cheri Guppy in Treatment: 6 Constitutional respirations regular, non-labored and within target range for patient.. Cardiovascular 2+ dorsalis pedis/posterior tibialis pulses. Psychiatric pleasant and cooperative. Notes Right lower extremity: Open wound to the anterior aspect limited to skin breakdown. 3+ pitting edema to the thigh. No signs of infection. Electronic Signature(s) Signed: 11/19/2022 4:55:26 PM By: Geralyn Corwin DO Entered By: Geralyn Corwin on 11/19/2022 16:51:10 -------------------------------------------------------------------------------- Physician Orders Details Patient Name: Date of Service: [name], [name] 11/19/2022 3:45 PM Medical Record Number: [id number] Patient Account Number: [id number] Date of Birth/Sex: Treating RN: 1951-03-25 (72 y.o. [name], Yvonne Kendall Primary Care Provider: Everrett Coombe Other Clinician: Referring Provider: Treating Provider/Extender: Cheri Guppy in Treatment: 6 Verbal / Phone Orders: No Diagnosis Coding ICD-10 Coding Code Description [phone] Unspecified open wound, left lower leg, initial encounter I87.312 Chronic venous hypertension (idiopathic) with ulcer of left lower  extremity L97.818 Non-pressure chronic ulcer of other part of right lower leg with other specified severity I89.0 Lymphedema, not elsewhere classified 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 - Dr. Mikey Bussing and Yvonne Kendall - room 7 Return A Tuesday 230pm 11/27/2022 Other: - Please call your cardiologist about increasing your fluid pill dosage. VIR, WHETSTINE ([id number]) [id number] Page 3 of 9 ******[facility] will call you to ship the juxtafits.***** Anesthetic (In clinic) Topical Lidocaine 4% applied to wound bed - Used in Clinic Bathing/ Shower/ Hygiene May shower and wash wound with soap and water. - Please do not get right leg wet with compression wraps. Please keep wrap on all week. Can use Cast Protector-purchase at [employer] etc. Edema Control - Lymphedema / SCD / Other Bilateral Lower Extremities Elevate legs to the level of the heart or above for 30 minutes daily and/or when sitting for 3-4 times a day throughout the day. Avoid standing for long periods of time. Exercise regularly Moisturize legs daily. Compression stocking or Garment 30-40 mm/Hg pressure to: - thigh high compression stockings- Circaid Juxtafit whole leg; qty 3 for both legs every 6 months for life. If compression wraps slide down please call wound center and speak with a nurse. Wound Treatment Wound #7 - Lower Leg Wound Laterality: Right, Anterior Cleanser: Soap and Water 1 x Per Week/30 Days Discharge Instructions: May shower and wash wound with dial antibacterial soap and water prior to dressing change. Cleanser: Vashe 5.8 (oz) 1 x Per Week/30 Days Discharge Instructions: Cleanse the wound with Vashe prior to applying a clean dressing using gauze sponges, not tissue or cotton balls. Peri-Wound Care: Sween Lotion (Moisturizing lotion) 1 x Per Week/30 Days Discharge Instructions: Apply moisturizing lotion as  directed Prim Dressing: Maxorb Extra Ag+ Alginate Dressing, 4x4.75 (in/in) 1 x Per Week/30 Days ary Discharge Instructions: Apply to wound bed as instructed Secondary Dressing: ABD Pad, 8x10 1 x Per Week/30 Days Discharge Instructions: Apply over primary dressing as directed. Secondary Dressing: CarboFLEX Odor Control Dressing, 4x4 in 1 x Per Week/30 Days Discharge Instructions: Apply over primary dressing as directed. Compression Wrap: Jeryl Columbia  K2, (equivalent to a 4 layer) two layer compression system, regular 1 x Per Week/30 Days Discharge Instructions: Apply Urgo K2 as directed (alternative to 4 layer compression). Electronic Signature(s) Signed: 11/19/2022 4:55:26 PM By: Geralyn Corwin DO Entered By: Geralyn Corwin on 11/19/2022 16:51:19 -------------------------------------------------------------------------------- Problem List Details Patient Name: Date of Service: [name], [name] 11/19/2022 3:45 PM Medical Record Number: [id number] Patient Account Number: [id number] Date of Birth/Sex: Treating RN: 11/26/1950 (72 y.o. M) Primary Care Provider: Everrett Coombe Other Clinician: Referring Provider: Treating Provider/Extender: Cheri Guppy in Treatment: 6 Active Problems ICD-10 Encounter Code Description Active Date MDM Diagnosis I87.312 Chronic venous hypertension (idiopathic) with ulcer of left lower extremity 11/05/2022 No Yes I87.311 Chronic venous hypertension (idiopathic) with ulcer of right lower extremity 11/19/2022 No Yes Stargell, Muzamil ([id number]) [id number] Page 4 of 9 L97.818 Non-pressure chronic ulcer of other part of right lower leg with other specified 10/04/2022 No Yes severity S81.802A Unspecified open wound, left lower leg, initial encounter 11/05/2022 No Yes I89.0 Lymphedema, not elsewhere classified 10/04/2022 No Yes 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, not elsewhere classified 10/04/2022 No Yes Inactive  Problems Resolved Problems ICD-10 Code Description Active Date Resolved Date L89.312 Pressure ulcer of right buttock, stage 2 10/04/2022 10/04/2022 L89.322 Pressure ulcer of left buttock, stage 2 10/04/2022 10/04/2022 B37.0 Candidal stomatitis 10/04/2022 10/04/2022 Electronic Signature(s) Signed: 11/19/2022 4:55:26 PM By: Geralyn Corwin DO Entered By: Geralyn Corwin on 11/19/2022 16:53:51 -------------------------------------------------------------------------------- Progress Note Details Patient Name: Date of Service: [name], [name] 11/19/2022 3:45 PM Medical Record Number: [id number] Patient Account Number: [id number] Date of Birth/Sex: Treating RN: 12/09/1950 (72 y.o. M) Primary Care Provider: Everrett Coombe Other Clinician: Referring Provider: Treating Provider/Extender: Cheri Guppy in Treatment: 6 Subjective Chief Complaint Information obtained from Patient 10/04/2022; patient is here for review of multiple wounds History of Present Illness (HPI) ADMISSION 10/04/2022 This is a 72 year old man who is quite disabled because of some form of myopathy. He follows for this with neurology. He has had biopsies that showed necrosis without inflammation. 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 In any case he is reasonably immobile at home. He has this apparatus to help him transfer. Because of difficulties getting him upstairs he has been sleeping in a recliner on the main floor. He also has a history of lymphedema and I see he was followed by PT for a period of time. He has a history of combined congestive heart failure but his last echo showed an EF of 60 to 65%. He has developed a wound on the right lateral leg over the last month or so. He also has a smaller wound on the right anterior lower leg. He is having problems with his left second toe and he has bilateral stage II  ulcers over his ischial tuberosity. The patient is complaining of oral pain under his tongue. He has bilateral heel pain and increasing edema with weight gain over the past week or 2 [name], [name] ([id number]) [phone].pdf Page 5 of 9 Past medical history includes myopathy/myositis question inclusion body myositis, rheumatoid arthritis, coronary artery disease, massive pulmonary embolism, type 2 diabetes, lymphedema, combined congestive heart failure, bilateral foot drop PTSD ABIs in our clinic were 1.22 on the right 1.27 on the left 6/17; patient presents for follow-up. He has been using silver alginate to the bilateral buttocks wounds. These have healed. We have also been using silver alginate  to the right lower extremity under compression therapy. He is tolerated this well. The wounds are smaller. 6/25; patient presents for follow-up. We have been using silver alginate to the right lower extremity under compression therapy. The anterior leg wound has healed. The lateral right leg wound is smaller. 7/8; patient presents for follow-up. We have been using silver alginate under compression therapy to the right lower extremity wound and this is healed. Unfortunately he has developed a blister to the left lower extremity. 7/15; patient presents for follow-up. We have been using silver alginate under 4-layer compression to the left lower extremity. His wound has healed here. Unfortunately he has developed blistering to the right lower extremity and has open wounds despite using compression therapy daily here. 7/22; patient presents for follow-up. We have been using silver alginate under 4-layer compression to the right lower extremity. He has been using his zipper compression wrap to the left lower extremity daily. 2 wounds have healed on the right lower extremity. He has 1 remaining wound to the anterior aspect. He has no issues or complaints today. Patient History Information  obtained from Patient, Chart. Social History Never smoker, Marital Status - Married, Alcohol Use - Never, Drug Use - No History, Caffeine Use - Daily - coffee. Medical History Hematologic/Lymphatic Patient has history of Anemia, Lymphedema - Bilateral Lower legs Cardiovascular Patient has history of Congestive Heart Failure, Deep Vein Thrombosis Endocrine Patient has history of Type II Diabetes Musculoskeletal Patient has history of Rheumatoid Arthritis Hospitalization/Surgery History - Biopsy left Shoulder; Angiogram Pulmonary Bilateral. Medical A Surgical History Notes nd Respiratory Hx: Pulmonary Embolism Gastrointestinal GERD Endocrine Hx: Hypothyroidism Genitourinary OZ:HYQMVHQIONGE Immunological XB:MWUXLKGMWN disease Musculoskeletal Hx: Necrotizing Myopathy Psychiatric PTSD Objective Constitutional respirations regular, non-labored and within target range for patient.. Vitals Time Taken: 4:29 PM, Height: 68 in, Weight: 220 lbs, BMI: 33.4, Temperature: 98.1 F, Pulse: 74 bpm, Respiratory Rate: 20 breaths/min, Blood Pressure: 164/80 mmHg. Cardiovascular 2+ dorsalis pedis/posterior tibialis pulses. Psychiatric pleasant and cooperative. General Notes: Right lower extremity: Open wound to the anterior aspect limited to skin breakdown. 3+ pitting edema to the thigh. No signs of infection. Integumentary (Hair, Skin) Wound #7 status is Open. Original cause of wound was Blister. The date acquired was: 11/06/2022. The wound has been in treatment 1 weeks. The wound is located on the Right,Anterior Lower Leg. The wound measures 1.4cm length x 1.4cm width x 0.1cm depth; 1.539cm^2 area and 0.154cm^3 volume. There is Fat Layer (Subcutaneous Tissue) exposed. There is no tunneling or undermining noted. There is a medium amount of serosanguineous drainage noted. The wound margin is distinct with the outline attached to the wound base. There is large (67-100%) red granulation within the  wound bed. There is no necrotic tissue within the wound bed. The periwound skin appearance had no abnormalities noted for texture. The periwound skin appearance had no abnormalities noted for moisture. The periwound skin appearance had no abnormalities noted for color. Periwound temperature was noted as No Abnormality. [name], [name] ([id number]) [id number] Page 6 of 9 Wound #8 status is Open. Original cause of wound was Blister. The date acquired was: 11/06/2022. The wound has been in treatment 1 weeks. The wound is located on the Right,Medial Lower Leg. The wound measures 0cm length x 0cm width x 0cm depth; 0cm^2 area and 0cm^3 volume. There is Fat Layer (Subcutaneous Tissue) exposed. There is no tunneling or undermining noted. There is a none present amount of drainage noted. The wound margin is distinct with the outline attached to the  wound base. There is large (67-100%) red granulation within the wound bed. There is no necrotic tissue within the wound bed. The periwound skin appearance had no abnormalities noted for texture. The periwound skin appearance had no abnormalities noted for moisture. The periwound skin appearance had no abnormalities noted for color. Periwound temperature was noted as No Abnormality. Wound #9 status is Open. Original cause of wound was Blister. The date acquired was: 11/06/2022. The wound has been in treatment 1 weeks. The wound is located on the Right,Lateral Lower Leg. The wound measures 0cm length x 0cm width x 0cm depth; 0cm^2 area and 0cm^3 volume. There is no tunneling or undermining noted. There is a none present amount of drainage noted. The wound margin is distinct with the outline attached to the wound base. There is no granulation within the wound bed. There is no necrotic tissue within the wound bed. The periwound skin appearance had no abnormalities noted for texture. The periwound skin appearance had no abnormalities noted for  moisture. The periwound skin appearance had no abnormalities noted for color. Periwound temperature was noted as No Abnormality. Assessment Active Problems ICD-10 Chronic venous hypertension (idiopathic) with ulcer of left lower extremity Chronic venous hypertension (idiopathic) with ulcer of right lower extremity Non-pressure chronic ulcer of other part of right lower leg with other specified severity Unspecified open wound, left lower leg, initial encounter Lymphedema, not elsewhere classified Other inflammatory and immune myopathies, not elsewhere classified Patient has done well with silver alginate under 4-layer compression. 2 out of the 3 wounds have healed on the right lower extremity. He has 1 wound remaining to the anterior aspect limited to skin breakdown and I recommended continuing silver alginate under 4-layer equivalent. Continue compression garment daily to the left lower extremity. We have had trouble getting thigh-high zipper compression garments. They were agreeable to juxta fit's. We will order this today. Procedures Wound #7 Pre-procedure diagnosis of Wound #7 is a Lymphedema located on the Right,Anterior Lower Leg . There was a Double Layer Compression Therapy Procedure by Shawn Stall, RN. Post procedure Diagnosis Wound #7: Same as Pre-Procedure Plan Follow-up Appointments: Return Appointment in 1 week. - Dr. Mikey Bussing and [place] - room 7 Tuesday 230pm 11/27/2022 Other: - Please call your cardiologist about increasing your fluid pill dosage. ******[facility] will call you to ship the juxtafits.***** Anesthetic: (In clinic) Topical Lidocaine 4% applied to wound bed - Used in Clinic Bathing/ Shower/ Hygiene: May shower and wash wound with soap and water. - Please do not get right leg wet with compression wraps. Please keep wrap on all week. Can use Cast Protector-purchase at [employer] etc. Edema Control - Lymphedema / SCD / Other: Elevate legs to the level of the heart  or above for 30 minutes daily and/or when sitting for 3-4 times a day throughout the day. Avoid standing for long periods of time. Exercise regularly Moisturize legs daily. Compression stocking or Garment 30-40 mm/Hg pressure to: - thigh high compression stockings- Circaid Juxtafit whole leg; qty 3 for both legs every 6 months for life. If compression wraps slide down please call wound center and speak with a nurse. WOUND #7: - Lower Leg Wound Laterality: Right, Anterior Cleanser: Soap and Water 1 x Per Week/30 Days Discharge Instructions: May shower and wash wound with dial antibacterial soap and water prior to dressing change. Cleanser: Vashe 5.8 (oz) 1 x Per Week/30 Days Discharge Instructions: Cleanse the wound with Vashe prior to applying a clean dressing using gauze sponges, not tissue or  cotton balls. Peri-Wound Care: Sween Lotion (Moisturizing lotion) 1 x Per Week/30 Days Discharge Instructions: Apply moisturizing lotion as directed Prim Dressing: Maxorb Extra Ag+ Alginate Dressing, 4x4.75 (in/in) 1 x Per Week/30 Days ary Discharge Instructions: Apply to wound bed as instructed Secondary Dressing: ABD Pad, 8x10 1 x Per Week/30 Days Discharge Instructions: Apply over primary dressing as directed. Secondary Dressing: CarboFLEX Odor Control Dressing, 4x4 in 1 x Per Week/30 Days Discharge Instructions: Apply over primary dressing as directed. Com pression Wrap: Urgo K2, (equivalent to a 4 layer) two layer compression system, regular 1 x Per Week/30 Days Discharge Instructions: Apply Urgo K2 as directed (alternative to 4 layer compression). [name], [name] ([id number]) [id number] Page 7 of 9 1. Silver alginate under 4-layer compressionright lower extremity 2. Daily compression garment to the left lower extremity 3. Order juxta fit 4. Follow-up in 1 week Electronic Signature(s) Signed: 11/22/2022 9:50:30 AM By: Geralyn Corwin DO Signed: 11/23/2022 4:29:32 PM  By: Shawn Stall RN, BSN Previous Signature: 11/19/2022 4:55:26 PM Version By: Geralyn Corwin DO Entered By: Shawn Stall on 11/22/2022 08:30:11 -------------------------------------------------------------------------------- HxROS Details Patient Name: Date of Service: [name], [name] 11/19/2022 3:45 PM Medical Record Number: [id number] Patient Account Number: [id number] Date of Birth/Sex: Treating RN: May 31, 1950 (72 y.o. M) Primary Care Provider: Everrett Coombe Other Clinician: Referring Provider: Treating Provider/Extender: Cheri Guppy in Treatment: 6 Information Obtained From Patient Chart Hematologic/Lymphatic Medical History: Positive for: Anemia; Lymphedema - Bilateral Lower legs Respiratory Medical History: Past Medical History Notes: Hx: Pulmonary Embolism Cardiovascular Medical History: Positive for: Congestive Heart Failure; Deep Vein Thrombosis Gastrointestinal Medical History: Past Medical History Notes: GERD Endocrine Medical History: Positive for: Type II Diabetes Past Medical History Notes: Hx: Hypothyroidism Time with diabetes: 33 years Treated with: Insulin Genitourinary Medical History: Past Medical History Notes: GE:XBMWUXLKGMWN Immunological Medical History: Past Medical History Notes: UU:VOZDGUYQIH disease Musculoskeletal Medical HistoryNOEL, RODIER ([id number]) [id number] Page 8 of 9 Positive for: Rheumatoid Arthritis Past Medical History Notes: Hx: Necrotizing Myopathy Psychiatric Medical History: Past Medical History Notes: PTSD Immunizations Pneumococcal Vaccine: Received Pneumococcal Vaccination: Yes Received Pneumococcal Vaccination On or After 60th Birthday: No Implantable Devices None Hospitalization / Surgery History Type of Hospitalization/Surgery Biopsy left Shoulder; Angiogram Pulmonary Bilateral Family and Social History Never smoker; Marital Status - Married;  Alcohol Use: Never; Drug Use: No History; Caffeine Use: Daily - coffee; Financial Concerns: No; Food, Clothing or Shelter Needs: No; Support System Lacking: No; Transportation Concerns: No Electronic Signature(s) Signed: 11/19/2022 4:55:26 PM By: Geralyn Corwin DO Entered By: Geralyn Corwin on 11/19/2022 16:50:35 -------------------------------------------------------------------------------- SuperBill Details Patient Name: Date of Service: NIKALAS, BRAMEL 11/19/2022 Medical Record Number: [id number] Patient Account Number: [id number] Date of Birth/Sex: Treating RN: [birth year] (72 y.o. [name], Yvonne Kendall Primary Care Provider: Everrett Coombe Other Clinician: Referring Provider: Treating Provider/Extender: Cheri Guppy in Treatment: 6 Diagnosis Coding ICD-10 Codes Code Description [phone] Chronic venous hypertension (idiopathic) with ulcer of left lower extremity I87.311 Chronic venous hypertension (idiopathic) with ulcer of right lower extremity L97.818 Non-pressure chronic ulcer of other part of right lower leg with other specified severity S81.802A Unspecified open wound, left lower leg, initial encounter I89.0 Lymphedema, not elsewhere classified 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, not elsewhere classified Facility Procedures : CPT4 Code: 66063016 Description: (Facility Use Only) [phone] - APPLY MULTLAY COMPRS LWR RT LEG Modifier: Quantity: 1 Physician Procedures : CPT4 Code Description Modifier [id number] - WC PHYS LEVEL 3 - EST PT ICD-10 Diagnosis Description I87.311 Chronic venous hypertension (idiopathic) with  ulcer of right lower extremity I89.0 Lymphedema, not elsewhere classified L97.818 Non-pressure  chronic ulcer of other part of right lower leg with other specified severity Sabia, Ibrahem ([id number]) [id number] Page Quantity: 1 9 of 9 Electronic Signature(s) Signed: 11/19/2022 4:55:26 PM By: Geralyn Corwin DO Entered By: Geralyn Corwin on 11/19/2022 16:54:31

## 2022-11-20 ENCOUNTER — Encounter (HOSPITAL_BASED_OUTPATIENT_CLINIC_OR_DEPARTMENT_OTHER): Payer: Self-pay | Admitting: Family

## 2022-11-20 ENCOUNTER — Ambulatory Visit (HOSPITAL_BASED_OUTPATIENT_CLINIC_OR_DEPARTMENT_OTHER): Payer: Medicare Other | Admitting: Family

## 2022-11-20 VITALS — BP 113/57 | Ht 68.0 in | Wt 228.6 lb

## 2022-11-20 DIAGNOSIS — I10 Essential (primary) hypertension: Secondary | ICD-10-CM

## 2022-11-20 DIAGNOSIS — I890 Lymphedema, not elsewhere classified: Secondary | ICD-10-CM

## 2022-11-20 DIAGNOSIS — G4733 Obstructive sleep apnea (adult) (pediatric): Secondary | ICD-10-CM

## 2022-11-20 DIAGNOSIS — R600 Localized edema: Secondary | ICD-10-CM

## 2022-11-20 DIAGNOSIS — Z7901 Long term (current) use of anticoagulants: Secondary | ICD-10-CM | POA: Diagnosis not present

## 2022-11-20 DIAGNOSIS — I952 Hypotension due to drugs: Secondary | ICD-10-CM | POA: Diagnosis not present

## 2022-11-20 DIAGNOSIS — E785 Hyperlipidemia, unspecified: Secondary | ICD-10-CM

## 2022-11-20 DIAGNOSIS — I25118 Atherosclerotic heart disease of native coronary artery with other forms of angina pectoris: Secondary | ICD-10-CM

## 2022-11-20 MED ORDER — METOPROLOL SUCCINATE ER 25 MG PO TB24: 12.5000 mg | ORAL_TABLET | Freq: Every day | ORAL | 3 refills | Status: DC

## 2022-11-20 NOTE — Patient Instructions (Addendum)
Medication Instructions:  Your physician has recommended you make the following change in your medication:    STOP Carvedilol  START Metoprolol Succinate half tabet (12.5mg ) every evening   *If you need a refill on your cardiac medications before your next appointment, please call your pharmacy*  Follow-Up: At [facility], you and your health needs are our priority.  As part of our continuing mission to provide you with exceptional heart care, we have created designated Provider Care Teams.  These Care Teams include your primary Cardiologist (physician) and Advanced Practice Providers (APPs -  Physician Assistants and Nurse Practitioners) who all work together to provide you with the care you need, when you need it.  We recommend signing up for the patient portal called "MyChart".  Sign up information is provided on this After Visit Summary.  MyChart is used to connect with patients for Virtual Visits (Telemedicine).  Patients are able to view lab/test results, encounter notes, upcoming appointments, etc.  Non-urgent messages can be sent to your provider as well.   To learn more about what you can do with MyChart, go to [email or web address].    Your next appointment:   3-4 months with Dr. Rennis Golden or Alver Sorrow, NP

## 2022-11-20 NOTE — Progress Notes (Signed)
Cardiology Office Note:  .   Date:  11/26/2022  ID:  [name], DOB 16-Apr-1951, MRN [id number] PCP: Everrett Coombe, DO  [facility] Providers Cardiologist:  Chrystie Nose, MD    History of Present Illness: .   [name] is a 72 y.o. male with a hx of hypertension, DM2, nonobstructive CAD, autoimmune myositis, PTSD, PE, DVT, HFrEF, CKD 3.   He was admitted 07/21/2021 and acute PE as well as acute RLE DVT with acute hypoxic respiratory failure in the setting of testosterone injections were discontinued.  Started on heparin and transitioned to Eliquis.  Recommended for indefinite OAC.  Echo  07/23/2021 with LVEF 35-40%, wall motion abnormalities noted, grade 1 diastolic dysfunction, trivial MR, mild AI, mild dilation ascending aorta 41 mm.  Due to AKI he was recommended for outpatient ischemic evaluation.   Seen 08/14/2021 Imdur discontinued due to patient report of headache.  Cardiac CTA 08/28/2021 coronary calcium score of 129 placing him in the 64th percentile for age, gender. LM 1-24% stenosis and ostial/prox LAD mild 25-49% stenosis (FFR 0.88 prox and 0.82 mid - no significant stenosis). For optimization of therapy, 10/09/21 Spironolactone added and 10/27/21 Hydralazine transitioned to [place]. Repeat echo 12/20/21 LVEF 55-60%, stable wall motion abnormalities, gr1DD.   Underwent sleep study 02/20/2022 with moderate OSA recommended for CPAP titration.   Admitted 11/12 - 03/15/2022 with AMS found to have pneumonia as well as heart failure exacerbation requiring diuresis.  He was discharged on torsemide.  Entresto discontinued due to hypotension.  Carvedilol dose reduced.  Echo during that admission with LVEF 55 to 60%, grade 1 diastolic dysfunction, aortic valve sclerosis without stenosis, mild dilation aortic root 41 mm.     Readmitted 11/20 - 03/24/2022 with acute metabolic encephalopathy with hypoglycemia, dehydration.  No evidence of infection.  Spironolactone, torsemide were  discontinued.   He saw PCP 03/29/2022 with Torsemide was resumed. At follow up 04/06/22 Torsemide increased to 20mg  QD due to bilateral LE 2+ pitting edema. He was set up for autoPAP.   Seen 06/05/2022 and due to bilateral 2+ pitting edema recommended torsemide 40 mg daily for 3 days then return to 20 mg daily.  Spironolactone 12.5 mg was added.  However he did not stay on long-term.   Admitted 5/15-5/18/24 with LOC seizure vs syncope. CT/MRI brain unremarkable. EEG no seizures. Workup did reveal various degrees of foraminal stenosis recommended for outpatient neurosurgery evaluation. Echo 09/13/22 LVEF 60-65%, no RWMA, mild LVH, no significant valvular abnomalities.   He presents today for follow-up with his wife. His 38 year old son is a Holiday representative at [employer] with goal of being an MD. [place] is providing lymphedema pumps this week. Notes persistent hypotension and has been holding morning dose of Carvedilol.  Reports no chest pain, orthopnea, PND, palpitations.  Exertional dyspnea stable at baseline.  ROS: Please see the history of present illness.    All other systems reviewed and are negative.   Studies Reviewed: .        Cardiac Studies & Procedures       ECHOCARDIOGRAM  ECHOCARDIOGRAM COMPLETE 09/13/2022  Narrative ECHOCARDIOGRAM REPORT    Patient Name:   [name] Date of Exam: 09/13/2022 Medical Rec #:  [id number]   Height:       68.0 in Accession #:    [id number]  Weight:       219.8 lb Date of Birth:  1950-09-15    BSA:          2.127 m Patient Age:  71 years    BP:           102/77 mmHg Patient Gender: M           HR:           73 bpm. Exam Location:  Inpatient  Procedure: 2D Echo, Cardiac Doppler, Color Doppler and Intracardiac Opacification Agent  Indications:    CHF- Acute diastolic  History:        Patient has prior history of Echocardiogram examinations, most recent 03/14/2022. CHF, Previous Myocardial Infarction; Risk Factors:Diabetes. Hx of DVT, PE, CKD.  Sonographer:     Milda Smart Referring Phys: [id number] [facility]   Sonographer Comments: Technically difficult study due to poor echo windows. Image acquisition challenging due to patient body habitus and Image acquisition challenging due to respiratory motion. IMPRESSIONS   1. Left ventricular ejection fraction, by estimation, is 60 to 65%. The left ventricle has normal function. The left ventricle has no regional wall motion abnormalities. There is mild concentric left ventricular hypertrophy. Left ventricular diastolic parameters are indeterminate. Elevated left ventricular end-diastolic pressure. 2. Right ventricular systolic function was not well visualized. The right ventricular size is normal. 3. The mitral valve is normal in structure. No evidence of mitral valve regurgitation. No evidence of mitral stenosis. 4. The aortic valve is tricuspid. Aortic valve regurgitation is trivial. Aortic valve sclerosis/calcification is present, without any evidence of aortic stenosis. 5. The inferior vena cava is normal in size with greater than 50% respiratory variability, suggesting right atrial pressure of 3 mmHg. 6. Ascending aorta measurements are within normal limits for age when indexed to body surface area.  FINDINGS Left Ventricle: Left ventricular ejection fraction, by estimation, is 60 to 65%. The left ventricle has normal function. The left ventricle has no regional wall motion abnormalities. Definity contrast agent was given IV to delineate the left ventricular endocardial borders. The left ventricular internal cavity size was normal in size. There is mild concentric left ventricular hypertrophy. Left ventricular diastolic parameters are indeterminate. Elevated left ventricular end-diastolic pressure.  Right Ventricle: The right ventricular size is normal. No increase in right ventricular wall thickness. Right ventricular systolic function was not well visualized.  Left Atrium: Left atrial size was  normal in size.  Right Atrium: Right atrial size was normal in size.  Pericardium: There is no evidence of pericardial effusion.  Mitral Valve: The mitral valve is normal in structure. No evidence of mitral valve regurgitation. No evidence of mitral valve stenosis. MV peak gradient, 4.8 mmHg. The mean mitral valve gradient is 2.0 mmHg.  Tricuspid Valve: The tricuspid valve is normal in structure. Tricuspid valve regurgitation is trivial. No evidence of tricuspid stenosis.  Aortic Valve: The aortic valve is tricuspid. Aortic valve regurgitation is trivial. Aortic valve sclerosis/calcification is present, without any evidence of aortic stenosis.  Pulmonic Valve: The pulmonic valve was normal in structure. Pulmonic valve regurgitation is mild. No evidence of pulmonic stenosis.  Aorta: The aortic root is normal in size and structure. Ascending aorta measurements are within normal limits for age when indexed to body surface area.  Venous: The inferior vena cava is normal in size with greater than 50% respiratory variability, suggesting right atrial pressure of 3 mmHg.  IAS/Shunts: No atrial level shunt detected by color flow Doppler.   LEFT VENTRICLE PLAX 2D LVIDd:         4.00 cm     Diastology LVIDs:         3.10 cm  LV e' medial:    4.21 cm/s LV PW:         1.20 cm     LV E/e' medial:  19.7 LV IVS:        1.10 cm     LV e' lateral:   5.70 cm/s LVOT diam:     2.30 cm     LV E/e' lateral: 14.6 LV SV:         94 LV SV Index:   44 LVOT Area:     4.15 cm  LV Volumes (MOD) LV vol d, MOD A2C: 71.2 ml LV vol d, MOD A4C: 76.3 ml LV vol s, MOD A2C: 33.1 ml LV vol s, MOD A4C: 31.6 ml LV SV MOD A2C:     38.1 ml LV SV MOD A4C:     76.3 ml LV SV MOD BP:      40.7 ml  RIGHT VENTRICLE            IVC RV S prime:     9.99 cm/s  IVC diam: 1.60 cm TAPSE (M-mode): 1.4 cm  LEFT ATRIUM             Index        RIGHT ATRIUM           Index LA diam:        3.40 cm 1.60 cm/m   RA Area:      11.80 cm LA Vol (A2C):   49.9 ml 23.46 ml/m  RA Volume:   23.90 ml  11.23 ml/m LA Vol (A4C):   41.5 ml 19.51 ml/m LA Biplane Vol: 46.4 ml 21.81 ml/m AORTIC VALVE LVOT Vmax:   97.30 cm/s LVOT Vmean:  74.900 cm/s LVOT VTI:    0.226 m  AORTA Ao Root diam: 3.80 cm Ao Asc diam:  4.00 cm  MITRAL VALVE MV Area (PHT): 3.53 cm    SHUNTS MV Area VTI:   3.04 cm    Systemic VTI:  0.23 m MV Peak grad:  4.8 mmHg    Systemic Diam: 2.30 cm MV Mean grad:  2.0 mmHg MV Vmax:       1.09 m/s MV Vmean:      61.5 cm/s MV Decel Time: 215 msec MV E velocity: 83.10 cm/s MV A velocity: 90.80 cm/s MV E/A ratio:  0.92  Armanda Magic MD Electronically signed by Armanda Magic MD Signature Date/Time: 09/13/2022/11:26:16 AM    Final     CT SCANS  CT CORONARY MORPH W/CTA COR W/SCORE 08/28/2021  Addendum 08/28/2021  3:20 PM ADDENDUM REPORT: 08/28/2021 15:17  CLINICAL DATA:  Chest pain  EXAM: Cardiac CTA  MEDICATIONS: Sub lingual nitro. 4mg  x 2  TECHNIQUE: The patient was scanned on a Siemens 192 slice scanner. Gantry rotation speed was 250 msecs. Collimation was 0.6 mm. A 100 kV prospective scan was triggered in the ascending thoracic aorta at 35-75% of the R-R interval. Average HR during the scan was 60 bpm. The 3D data set was interpreted on a dedicated work station using MPR, MIP and VRT modes. A total of 80cc of contrast was used.  FINDINGS: Non-cardiac: See separate report from [facility].  Pulmonary veins drain normally to the left atrium. No LA appendage thrombus noted.  Calcium Score: 129 Agatston units.  Coronary Arteries: Right dominant with no anomalies  LM: Mixed plaque distal left main, mild (1-24%) stenosis.  LAD system: Mixed plaque ostial/proximal LAD, mild (25-49%) stenosis.  Circumflex system: No plaque or stenosis.  RCA  system: No plaque or stenosis.  IMPRESSION: 1. Coronary artery calcium score 129 Agatston units. This places the patient in the  64th percentile for age and gender, suggesting intermediate risk for future cardiac events.  2. Suspect mild (25-49%) stenosis in the ostial/proximal LAD. Will send for FFR to confirm.  Dalton Mclean   Electronically Signed By: Marca Ancona M.D. On: 08/28/2021 15:17  Narrative EXAM: OVER-READ INTERPRETATION  CT CHEST  The following report is an over-read performed by radiologist Dr. Charlett Nose of [facility] on 08/28/2021. This over-read does not include interpretation of cardiac or coronary anatomy or pathology. The coronary CTA interpretation by the cardiologist is attached.  COMPARISON:  07/21/2021  FINDINGS: Vascular: Heart is normal size. Aorta normal caliber. Previously seen saddle embolus no longer visualized. No central pulmonary emboli. The segmental and subsegmental vessels are not well opacified on this coronary study.  Mediastinum/Nodes: No adenopathy  Lungs/Pleura: No confluent opacities or effusions.  Upper Abdomen: No acute findings  Musculoskeletal: Chest wall soft tissues are unremarkable. No acute bony abnormality.  IMPRESSION: No acute or significant extracardiac abnormality.  Previously seen saddle embolus no longer visualized. No central pulmonary embolus seen.  Electronically Signed: By: Charlett Nose M.D. On: 08/28/2021 14:33          Risk Assessment/Calculations:        STOP-Bang Score:  5      Physical Exam:   VS:  BP (!) 113/57 (BP Location: Right Arm, Patient Position: Sitting, Cuff Size: Normal)   Ht 5\' 8"  (1.727 m)   Wt 228 lb 9.6 oz (103.7 kg)   BMI 34.76 kg/m    Wt Readings from Last 3 Encounters:  11/20/22 228 lb 9.6 oz (103.7 kg)  09/15/22 207 lb 14.3 oz (94.3 kg)  09/05/22 207 lb 14.3 oz (94.3 kg)    GEN: Well nourished, well developed in no acute distress NECK: No JVD; No carotid bruits CARDIAC: RRR, no murmurs, rubs, gallops RESPIRATORY:  Clear to auscultation without rales, wheezing or rhonchi   ABDOMEN: Soft, non-tender, non-distended EXTREMITIES:  Bilateral LE with 2+ edema consistent with lymphedema; No deformity   ASSESSMENT AND PLAN: .    Lymphedema-following with lymphedema clinic.  [facility] providing [facility] this [place].    PE / Hx of DVT - In setting of testosterone injections, admission 06/2021 with acute saddle PE with right heart strain and right popliteal s/p thrombectomy 07/22/21.  LVEF has since recovered.  Continue Eliquis 5 mg twice daily.  Will require lifelong anticoagulation.  Does not meet dose reduction criteria.  Denies bleeding complications.     HTN -recurrent hypotension.  Stop carvedilol and start Toprol 12.5 mg nightly. Discussed to monitor BP at home at least 2 hours after medications and sitting for 5-10 minutes.    Nonobstructive CAD - Cardiac CTA 08/28/21 moderate nonobstructive disease. No anginal symptoms.  Transition carvedilol to metoprolol succinate 12.5 mg nightly due to hypotension.  No ASA due to [facility]. Heart healthy diet and regular cardiovascular exercise encouraged.  Unclear why not on statin, address at follow up.    WGN5A - Careful titration of diuretic and antihypertensive.    OSA - CPAP compliance encouraged.    Combined heart failure with recovered LVEF - LVEF 35-40% in setting of PE. 11/2021 and 02/2022 repeat echo with LVEF returned to normal 55-60%. GDMT includes Metoprolol, Torsemide. Entresto, Spironolactone previously stopped due to hypotension. Recommended to weigh daily and keep log for future follow-up. Encouraged to call office if you  have weight gain of 2 pounds overnight of 5 pounds in 1 week.   GERD - Continue to follow with PCP.  Continue Protonix.     Hypothyroidism - Continue to follow with PCP.    DM2 -  Continue to follow with PCP.         Dispo: follow up in 3-4 mos  Signed, Alver Sorrow, NP

## 2022-11-21 NOTE — Progress Notes (Signed)
AIKEN, WITHEM ([id number]) [id number] Page 1 of 10 Visit Report for 11/19/2022 Arrival Information Details Patient Name: Date of Service: [name], [name] 11/19/2022 3:45 PM Medical Record Number: [id number] Patient Account Number: [id number] Date of Birth/Sex: Treating RN: Dec 30, 1950 (72 y.o. [name], [name] Primary Care Charmine Bockrath: Everrett Coombe Other Clinician: Referring Kasi Lasky: Treating Dana Dorner/Extender: Cheri Guppy in Treatment: 6 Visit Information History Since Last Visit Added or deleted any medications: No Patient Arrived: Wheel Chair Any new allergies or adverse reactions: No Arrival Time: 16:28 Had a fall or experienced change in No Accompanied By: wife activities of daily living that may affect Transfer Assistance: None risk of falls: Patient Identification Verified: Yes Signs or symptoms of abuse/neglect since last visito No Secondary Verification Process Completed: Yes Hospitalized since last visit: No Patient Requires Transmission-Based Precautions: No Implantable device outside of the clinic excluding No Patient Has Alerts: Yes cellular tissue based products placed in the center Patient Alerts: ELIQUIS since last visit: Has Dressing in Place as Prescribed: Yes Has Compression in Place as Prescribed: Yes Pain Present Now: No Electronic Signature(s) Signed: 11/20/2022 4:56:09 PM By: Shawn Stall RN, BSN Entered By: Shawn Stall on 11/19/2022 16:29:36 -------------------------------------------------------------------------------- Compression Therapy Details Patient Name: Date of Service: TARREN, [name] 11/19/2022 3:45 PM Medical Record Number: [id number] Patient Account Number: [id number] Date of Birth/Sex: Treating RN: Sep 01, 1950 (72 y.o. Tammy Sours Primary Care Gwendolin Briel: Everrett Coombe Other Clinician: Referring Mitsugi Schrader: Treating Nalah Macioce/Extender: Cheri Guppy in Treatment:  6 Compression Therapy Performed for Wound Assessment: Wound #7 Right,Anterior Lower Leg Performed By: Clinician Shawn Stall, RN Compression Type: Double Layer Post Procedure Diagnosis Same as Pre-procedure Electronic Signature(s) Signed: 11/20/2022 4:56:09 PM By: Shawn Stall RN, BSN Entered By: Shawn Stall on 11/19/2022 16:40:24 Enright, [name] ([id number]) [id number] Page 2 of 10 -------------------------------------------------------------------------------- Encounter Discharge Information Details Patient Name: Date of Service: [name] 11/19/2022 3:45 PM Medical Record Number: [id number] Patient Account Number: [id number] Date of Birth/Sex: Treating RN: [birth year] (72 y.o. Tammy Sours Primary Care Viraaj Vorndran: Everrett Coombe Other Clinician: Referring Rickeya Manus: Treating Yuritzy Zehring/Extender: Cheri Guppy in Treatment: 6 Encounter Discharge Information Items Discharge Condition: Stable Ambulatory Status: Wheelchair Discharge Destination: Home Transportation: Private Auto Accompanied By: wife Schedule Follow-up Appointment: Yes Clinical Summary of Care: Electronic Signature(s) Signed: 11/20/2022 4:56:09 PM By: Shawn Stall RN, BSN Entered By: Shawn Stall on 11/19/2022 16:44:23 -------------------------------------------------------------------------------- Lower Extremity Assessment Details Patient Name: Date of Service: [name] 11/19/2022 3:45 PM Medical Record Number: [id number] Patient Account Number: [id number] Date of Birth/Sex: Treating RN: 06-22-1950 (72 y.o. Tammy Sours Primary Care Mattox Schorr: Everrett Coombe Other Clinician: Referring Masaye Gatchalian: Treating Cherye Gaertner/Extender: Cheri Guppy in Treatment: 6 Edema Assessment Assessed: [Left: No] [Right: No] Edema: [Left: Yes] [Right: No] Calf Left: Right: Point of Measurement: 37 cm From Medial Instep 48 cm Ankle Left:  Right: Point of Measurement: 10 cm From Medial Instep 27.5 cm Electronic Signature(s) Signed: 11/20/2022 4:56:09 PM By: Shawn Stall RN, BSN Entered By: Shawn Stall on 11/19/2022 16:36:26 -------------------------------------------------------------------------------- Multi Wound Chart Details Patient Name: Date of Service: [name] 11/19/2022 3:45 PM Medical Record Number: [id number] Patient Account Number: [id number] Date of Birth/Sex: Treating RN: [birth year] (72 y.o. M) Primary Care Laurieanne Galloway: Everrett Coombe Other Clinician: Referring Chenille Toor: Treating Catori Panozzo/Extender: Cheri Guppy in Treatment: 6 Gackle, Chrisangel ([id number]) [id number] Page 3 of 10 Vital Signs Height(in): 68 Pulse(bpm): 74 Weight(lbs): 220 Blood Pressure(mmHg): 164/80 Body Mass Index(BMI): 33.4 Temperature(F): 98.1 Respiratory Rate(breaths/min):  20 [7:Photos:] Right, Anterior Lower Leg Right, Medial Lower Leg Right, Lateral Lower Leg Wound Location: Blister Blister Blister Wounding Event: Lymphedema Lymphedema Lymphedema Primary Etiology: Anemia, Lymphedema, Congestive Anemia, Lymphedema, Congestive Anemia, Lymphedema, Congestive Comorbid History: Heart Failure, Deep Vein Thrombosis, Heart Failure, Deep Vein Thrombosis, Heart Failure, Deep Vein Thrombosis, Type II Diabetes, Rheumatoid Arthritis Type II Diabetes, Rheumatoid Arthritis Type II Diabetes, Rheumatoid Arthritis 11/06/2022 11/06/2022 11/06/2022 Date Acquired: 1 1 1  Weeks of Treatment: Open Open Open Wound Status: No No No Wound Recurrence: 1.4x1.4x0.1 0x0x0 0x0x0 Measurements L x W x D (cm) 1.539 0 0 A (cm) : rea 0.154 0 0 Volume (cm) : 53.30% 100.00% 100.00% % Reduction in Area: 53.30% 100.00% 100.00% % Reduction in Volume: Full Thickness Without Exposed Full Thickness Without Exposed Full Thickness Without Exposed Classification: Support Structures Support Structures Support  Structures Medium None Present None Present Exudate Amount: Serosanguineous N/A N/A Exudate Type: red, brown N/A N/A Exudate Color: Distinct, outline attached Distinct, outline attached Distinct, outline attached Wound Margin: Large (67-100%) Large (67-100%) None Present (0%) Granulation Amount: Red Red N/A Granulation Quality: None Present (0%) None Present (0%) None Present (0%) Necrotic Amount: Fat Layer (Subcutaneous Tissue): Yes Fat Layer (Subcutaneous Tissue): Yes Fascia: No Exposed Structures: Fascia: No Fascia: No Fat Layer (Subcutaneous Tissue): No Tendon: No Tendon: No Tendon: No Muscle: No Muscle: No Muscle: No Joint: No Joint: No Joint: No Bone: No Bone: No Bone: No Large (67-100%) Large (67-100%) Large (67-100%) Epithelialization: No Abnormalities Noted No Abnormalities Noted No Abnormalities Noted Periwound Skin Texture: No Abnormalities Noted No Abnormalities Noted No Abnormalities Noted Periwound Skin Moisture: No Abnormalities Noted No Abnormalities Noted No Abnormalities Noted Periwound Skin Color: No Abnormality No Abnormality No Abnormality Temperature: Compression Therapy N/A N/A Procedures Performed: Treatment Notes Wound #7 (Lower Leg) Wound Laterality: Right, Anterior Cleanser Soap and Water Discharge Instruction: May shower and wash wound with dial antibacterial soap and water prior to dressing change. Vashe 5.8 (oz) Discharge Instruction: Cleanse the wound with Vashe prior to applying a clean dressing using gauze sponges, not tissue or cotton balls. Peri-Wound Care Sween Lotion (Moisturizing lotion) Discharge Instruction: Apply moisturizing lotion as directed Topical Primary Dressing Maxorb Extra Ag+ Alginate Dressing, 4x4.75 (in/in) Discharge Instruction: Apply to wound bed as instructed Secondary Dressing ABD Pad, 8x10 Discharge Instruction: Apply over primary dressing as directed. MYRON, STANKOVICH ([id number])  [id number] Page 4 of 10 CarboFLEX Odor Control Dressing, 4x4 in Discharge Instruction: Apply over primary dressing as directed. Secured With Compression Wrap Urgo K2, (equivalent to a 4 layer) two layer compression system, regular Discharge Instruction: Apply Urgo K2 as directed (alternative to 4 layer compression). Compression Stockings Add-Ons Wound #8 (Lower Leg) Wound Laterality: Right, Medial Cleanser Peri-Wound Care Topical Primary Dressing Secondary Dressing Secured With Compression Wrap Compression Stockings Add-Ons Wound #9 (Lower Leg) Wound Laterality: Right, Lateral Cleanser Peri-Wound Care Topical Primary Dressing Secondary Dressing Secured With Compression Wrap Compression Stockings Add-Ons Electronic Signature(s) Signed: 11/19/2022 4:55:26 PM By: Geralyn Corwin DO Entered By: Geralyn Corwin on 11/19/2022 16:48:22 -------------------------------------------------------------------------------- Multi-Disciplinary Care Plan Details Patient Name: Date of Service: ZERIC, BARANOWSKI 11/19/2022 3:45 PM Medical Record Number: [id number] Patient Account Number: [id number] Date of Birth/Sex: Treating RN: Jul 25, 1950 (72 y.o. Tammy Sours Primary Care Dimitri Dsouza: Everrett Coombe Other Clinician: Referring Makynli Stills: Treating Neola Worrall/Extender: Cheri Guppy in Treatment: [address] [place], [place] ([id number]) [id number] Page 5 of 10 Wound/Skin Impairment Nursing Diagnoses: Impaired tissue integrity Goals: Patient/caregiver will verbalize understanding of skin care regimen Date Initiated:  10/04/2022 Target Resolution Date: 12/29/2022 Goal Status: Active Interventions: Assess ulceration(s) every visit Treatment Activities: Skin care regimen initiated : 10/04/2022 Notes: Electronic Signature(s) Signed: 11/20/2022 4:56:09 PM By: Shawn Stall RN, BSN Entered By: Shawn Stall on  11/19/2022 16:43:06 -------------------------------------------------------------------------------- Pain Assessment Details Patient Name: Date of Service: SHEDRICK, SARLI 11/19/2022 3:45 PM Medical Record Number: [id number] Patient Account Number: [id number] Date of Birth/Sex: Treating RN: November 16, 1950 (72 y.o. Tammy Sours Primary Care Makynli Stills: Everrett Coombe Other Clinician: Referring Amiir Heckard: Treating Birdia Jaycox/Extender: Cheri Guppy in Treatment: 6 Active Problems Location of Pain Severity and Description of Pain Patient Has Paino No Site Locations Pain Management and Medication Current Pain Management: Electronic Signature(s) Signed: 11/20/2022 4:56:09 PM By: Shawn Stall RN, BSN Entered By: Shawn Stall on 11/19/2022 16:29:45 Facey, Cort ([id number]) [id number] Page 6 of 10 -------------------------------------------------------------------------------- Patient/Caregiver Education Details Patient Name: Date of Service: JAYZEN, PAVER 7/22/2024andnbsp3:45 PM Medical Record Number: [id number] Patient Account Number: [id number] Date of Birth/Gender: Treating RN: [birth year] (72 y.o. Tammy Sours Primary Care Physician: Everrett Coombe Other Clinician: Referring Physician: Treating Physician/Extender: Cheri Guppy in Treatment: 6 Education Assessment Education Provided To: Patient Education Topics Provided Wound/Skin Impairment: Handouts: Caring for Your Ulcer Methods: Explain/Verbal Responses: Reinforcements needed Electronic Signature(s) Signed: 11/20/2022 4:56:09 PM By: Shawn Stall RN, BSN Entered By: Shawn Stall on 11/19/2022 16:43:18 -------------------------------------------------------------------------------- Wound Assessment Details Patient Name: Date of Service: JOHNTAVIUS, SHEPARD 11/19/2022 3:45 PM Medical Record Number: [id number] Patient Account Number: [id number] Date of  Birth/Sex: Treating RN: 1950/09/01 (72 y.o. Tammy Sours Primary Care Dorrance Sellick: Everrett Coombe Other Clinician: Referring Alondra Sahni: Treating Dalinda Heidt/Extender: Cheri Guppy in Treatment: 6 Wound Status Wound Number: 7 Primary Lymphedema Etiology: Wound Location: Right, Anterior Lower Leg Wound Open Wounding Event: Blister Status: Date Acquired: 11/06/2022 Comorbid Anemia, Lymphedema, Congestive Heart Failure, Deep Vein Weeks Of Treatment: 1 History: Thrombosis, Type II Diabetes, Rheumatoid Arthritis Clustered Wound: No Photos Wound Measurements Length: (cm) 1.4 Bloxham, Keelon ([id number]) Width: (cm) 1.4 Depth: (cm) 0.1 Area: (cm) 1.539 Volume: (cm) 0.154 % Reduction in Area: 53.3% [id number] Page 7 of 10 % Reduction in Volume: 53.3% Epithelialization: Large (67-100%) Tunneling: No Undermining: No Wound Description Classification: Full Thickness Without Exposed Support Structures Wound Margin: Distinct, outline attached Exudate Amount: Medium Exudate Type: Serosanguineous Exudate Color: red, brown Foul Odor After Cleansing: No Slough/Fibrino No Wound Bed Granulation Amount: Large (67-100%) Exposed Structure Granulation Quality: Red Fascia Exposed: No Necrotic Amount: None Present (0%) Fat Layer (Subcutaneous Tissue) Exposed: Yes Tendon Exposed: No Muscle Exposed: No Joint Exposed: No Bone Exposed: No Periwound Skin Texture Texture Color No Abnormalities Noted: Yes No Abnormalities Noted: Yes Moisture Temperature / Pain No Abnormalities Noted: Yes Temperature: No Abnormality Treatment Notes Wound #7 (Lower Leg) Wound Laterality: Right, Anterior Cleanser Soap and Water Discharge Instruction: May shower and wash wound with dial antibacterial soap and water prior to dressing change. Vashe 5.8 (oz) Discharge Instruction: Cleanse the wound with Vashe prior to applying a clean dressing using gauze sponges, not  tissue or cotton balls. Peri-Wound Care Sween Lotion (Moisturizing lotion) Discharge Instruction: Apply moisturizing lotion as directed Topical Primary Dressing Maxorb Extra Ag+ Alginate Dressing, 4x4.75 (in/in) Discharge Instruction: Apply to wound bed as instructed Secondary Dressing ABD Pad, 8x10 Discharge Instruction: Apply over primary dressing as directed. CarboFLEX Odor Control Dressing, 4x4 in Discharge Instruction: Apply over primary dressing as directed. Secured With Compression Wrap Urgo K2, (equivalent to a 4 layer) two layer compression system, regular Discharge Instruction:  Apply Urgo K2 as directed (alternative to 4 layer compression). Compression Stockings Add-Ons Electronic Signature(s) Signed: 11/20/2022 4:56:09 PM By: Shawn Stall RN, BSN Signed: 11/21/2022 11:54:52 AM By: Thayer Dallas Entered By: Thayer Dallas on 11/19/2022 16:38:24 Lanni, [name] ([id number]) [id number] Page 8 of 10 -------------------------------------------------------------------------------- Wound Assessment Details Patient Name: Date of Service: ARTHOR, GORTER 11/19/2022 3:45 PM Medical Record Number: [id number] Patient Account Number: [id number] Date of Birth/Sex: Treating RN: [birth year] (72 y.o. Tammy Sours Primary Care Arby Dahir: Everrett Coombe Other Clinician: Referring Yecenia Dalgleish: Treating Yandel Zeiner/Extender: Cheri Guppy in Treatment: 6 Wound Status Wound Number: 8 Primary Lymphedema Etiology: Wound Location: Right, Medial Lower Leg Wound Open Wounding Event: Blister Status: Date Acquired: 11/06/2022 Comorbid Anemia, Lymphedema, Congestive Heart Failure, Deep Vein Weeks Of Treatment: 1 History: Thrombosis, Type II Diabetes, Rheumatoid Arthritis Clustered Wound: No Photos Wound Measurements Length: (cm) Width: (cm) Depth: (cm) Area: (cm) Volume: (cm) 0 % Reduction in Area: 100% 0 % Reduction in Volume: 100% 0  Epithelialization: Large (67-100%) 0 Tunneling: No 0 Undermining: No Wound Description Classification: Full Thickness Without Exposed Suppor Wound Margin: Distinct, outline attached Exudate Amount: None Present t Structures Foul Odor After Cleansing: No Slough/Fibrino No Wound Bed Granulation Amount: Large (67-100%) Exposed Structure Granulation Quality: Red Fascia Exposed: No Necrotic Amount: None Present (0%) Fat Layer (Subcutaneous Tissue) Exposed: Yes Tendon Exposed: No Muscle Exposed: No Joint Exposed: No Bone Exposed: No Periwound Skin Texture Texture Color No Abnormalities Noted: Yes No Abnormalities Noted: Yes Moisture Temperature / Pain No Abnormalities Noted: Yes Temperature: No Abnormality Electronic Signature(s) Signed: 11/20/2022 4:56:09 PM By: Shawn Stall RN, BSN Signed: 11/21/2022 11:54:52 AM By: Thayer Dallas Entered By: Thayer Dallas on 11/19/2022 16:38:57 Benefiel, [name] ([id number]) [id number] Page 9 of 10 -------------------------------------------------------------------------------- Wound Assessment Details Patient Name: Date of Service: NORVEL, WENKER 11/19/2022 3:45 PM Medical Record Number: [id number] Patient Account Number: [id number] Date of Birth/Sex: Treating RN: [birth year] (72 y.o. Tammy Sours Primary Care Arzu Mcgaughey: Everrett Coombe Other Clinician: Referring Paola Flynt: Treating Carole Doner/Extender: Cheri Guppy in Treatment: 6 Wound Status Wound Number: 9 Primary Lymphedema Etiology: Wound Location: Right, Lateral Lower Leg Wound Open Wounding Event: Blister Status: Date Acquired: 11/06/2022 Comorbid Anemia, Lymphedema, Congestive Heart Failure, Deep Vein Weeks Of Treatment: 1 History: Thrombosis, Type II Diabetes, Rheumatoid Arthritis Clustered Wound: No Photos Wound Measurements Length: (cm) Width: (cm) Depth: (cm) Area: (cm) Volume: (cm) 0 % Reduction in Area: 100% 0 % Reduction  in Volume: 100% 0 Epithelialization: Large (67-100%) 0 Tunneling: No 0 Undermining: No Wound Description Classification: Full Thickness Without Exposed Support Wound Margin: Distinct, outline attached Exudate Amount: None Present Structures Foul Odor After Cleansing: No Slough/Fibrino No Wound Bed Granulation Amount: None Present (0%) Exposed Structure Necrotic Amount: None Present (0%) Fascia Exposed: No Fat Layer (Subcutaneous Tissue) Exposed: No Tendon Exposed: No Muscle Exposed: No Joint Exposed: No Bone Exposed: No Periwound Skin Texture Texture Color No Abnormalities Noted: Yes No Abnormalities Noted: Yes Moisture Temperature / Pain No Abnormalities Noted: Yes Temperature: No Abnormality Electronic Signature(s) Signed: 11/20/2022 4:56:09 PM By: Shawn Stall RN, BSN Signed: 11/21/2022 11:54:52 AM By: Thayer Dallas Entered By: Thayer Dallas on 11/19/2022 16:39:24 Kramlich, [name] ([id number]) [id number] Page 10 of 10 -------------------------------------------------------------------------------- Vitals Details Patient Name: Date of Service: CLEATIS, FANDRICH 11/19/2022 3:45 PM Medical Record Number: [id number] Patient Account Number: [id number] Date of Birth/Sex: Treating RN: 1951-01-13 (72 y.o. Tammy Sours Primary Care Steel Kerney: Everrett Coombe Other Clinician: Referring Belkis Norbeck: Treating Davonte Siebenaler/Extender: Cheri Guppy  in Treatment: 6 Vital Signs Time Taken: 16:29 Temperature (F): 98.1 Height (in): 68 Pulse (bpm): 74 Weight (lbs): 220 Respiratory Rate (breaths/min): 20 Body Mass Index (BMI): 33.4 Blood Pressure (mmHg): 164/80 Reference Range: 80 - 120 mg / dl Electronic Signature(s) Signed: 11/20/2022 4:56:09 PM By: Shawn Stall RN, BSN Entered By: Shawn Stall on 11/19/2022 21:30:86

## 2022-11-26 ENCOUNTER — Encounter (HOSPITAL_BASED_OUTPATIENT_CLINIC_OR_DEPARTMENT_OTHER): Payer: Self-pay | Admitting: Family

## 2022-11-27 ENCOUNTER — Ambulatory Visit: Payer: Medicare Other | Admitting: Family Medicine

## 2022-11-27 ENCOUNTER — Encounter (HOSPITAL_BASED_OUTPATIENT_CLINIC_OR_DEPARTMENT_OTHER): Payer: Self-pay

## 2022-11-29 ENCOUNTER — Encounter (HOSPITAL_BASED_OUTPATIENT_CLINIC_OR_DEPARTMENT_OTHER): Payer: Medicare Other | Admitting: Internal Medicine

## 2022-11-30 ENCOUNTER — Encounter (HOSPITAL_BASED_OUTPATIENT_CLINIC_OR_DEPARTMENT_OTHER): Payer: Medicare Other | Attending: Internal Medicine | Admitting: Internal Medicine

## 2022-11-30 DIAGNOSIS — S81802A Unspecified open wound, left lower leg, initial encounter: Secondary | ICD-10-CM | POA: Insufficient documentation

## 2022-11-30 DIAGNOSIS — E119 Type 2 diabetes mellitus without complications: Secondary | ICD-10-CM | POA: Diagnosis not present

## 2022-11-30 DIAGNOSIS — I890 Lymphedema, not elsewhere classified: Secondary | ICD-10-CM

## 2022-11-30 DIAGNOSIS — L97818 Non-pressure chronic ulcer of other part of right lower leg with other specified severity: Secondary | ICD-10-CM | POA: Diagnosis not present

## 2022-11-30 DIAGNOSIS — G7249 Other inflammatory and immune myopathies, not elsewhere classified: Secondary | ICD-10-CM | POA: Diagnosis not present

## 2022-11-30 DIAGNOSIS — I87311 Chronic venous hypertension (idiopathic) with ulcer of right lower extremity: Secondary | ICD-10-CM

## 2022-11-30 DIAGNOSIS — X58XXXA Exposure to other specified factors, initial encounter: Secondary | ICD-10-CM | POA: Diagnosis not present

## 2022-11-30 DIAGNOSIS — I87312 Chronic venous hypertension (idiopathic) with ulcer of left lower extremity: Secondary | ICD-10-CM | POA: Diagnosis not present

## 2022-11-30 DIAGNOSIS — Z86711 Personal history of pulmonary embolism: Secondary | ICD-10-CM | POA: Insufficient documentation

## 2022-11-30 NOTE — Progress Notes (Signed)
[name], [name] ([id number]) [id number] Page 1 of 8 Visit Report for 11/30/2022 Chief Complaint Document Details Patient Name: Date of Service: [name], [name] 11/30/2022 10:15 A M Medical Record Number: [id number] Patient Account Number: [id number] Date of Birth/Sex: Treating RN: 09-Dec-1950 (72 y.o. M) Primary Care Provider: Everrett Coombe Other Clinician: Referring Provider: Treating Provider/Extender: Cheri Guppy in Treatment: 8 Information Obtained from: Patient Chief Complaint 10/04/2022; patient is here for review of multiple wounds Electronic Signature(s) Signed: 11/30/2022 11:32:50 AM By: Geralyn Corwin DO Entered By: Geralyn Corwin on 11/30/2022 11:15:20 -------------------------------------------------------------------------------- HPI Details Patient Name: Date of Service: [name] 11/30/2022 10:15 A M Medical Record Number: [id number] Patient Account Number: [id number] Date of Birth/Sex: Treating RN: 02-10-1951 (72 y.o. M) Primary Care Provider: Everrett Coombe Other Clinician: Referring Provider: Treating Provider/Extender: Cheri Guppy in Treatment: 8 History of Present Illness HPI Description: ADMISSION 10/04/2022 This is a 72 year old man who is quite disabled because of some form of myopathy. He follows for this with neurology. He has had biopsies that showed necrosis without inflammation. 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 In any case he is reasonably immobile at home. He has this apparatus to help him transfer. Because of difficulties getting him upstairs he has been sleeping in a recliner on the main floor. He also has a history of lymphedema and I see he was followed by PT for a period of time. He has a history of combined congestive heart failure but his last echo showed an EF of 60 to  65%. He has developed a wound on the right lateral leg over the last month or so. He also has a smaller wound on the right anterior lower leg. He is having problems with his left second toe and he has bilateral stage II ulcers over his ischial tuberosity. The patient is complaining of oral pain under his tongue. He has bilateral heel pain and increasing edema with weight gain over the past week or 2 Past medical history includes myopathy/myositis question inclusion body myositis, rheumatoid arthritis, coronary artery disease, massive pulmonary embolism, type 2 diabetes, lymphedema, combined congestive heart failure, bilateral foot drop PTSD ABIs in our clinic were 1.22 on the right 1.27 on the left 6/17; patient presents for follow-up. He has been using silver alginate to the bilateral buttocks wounds. These have healed. We have also been using silver alginate to the right lower extremity under compression therapy. He is tolerated this well. The wounds are smaller. 6/25; patient presents for follow-up. We have been using silver alginate to the right lower extremity under compression therapy. The anterior leg wound has healed. The lateral right leg wound is smaller. 7/8; patient presents for follow-up. We have been using silver alginate under compression therapy to the right lower extremity wound and this is healed. Unfortunately he has developed a blister to the left lower extremity. 7/15; patient presents for follow-up. We have been using silver alginate under 4-layer compression to the left lower extremity. His wound has healed here. Unfortunately he has developed blistering to the right lower extremity and has open wounds despite using compression therapy daily here. [name], [name] ([id number]) [id number] Page 2 of 8 7/22; patient presents for follow-up. We have been using silver alginate under 4-layer compression to the right lower extremity. He has been using his  zipper compression wrap to the left lower extremity daily. 2 wounds have  healed on the right lower extremity. He has 1 remaining wound to the anterior aspect. He has no issues or complaints today. 8/2; patient presents for follow-up. We have been using silver alginate under Urgo K2 to the right lower extremity. His wounds have healed. He has zipper compression stockings to use. We have ordered him thigh-high Velcro compression wraps that are arriving in 2 weeks. Electronic Signature(s) Signed: 11/30/2022 11:32:50 AM By: Geralyn Corwin DO Entered By: Geralyn Corwin on 11/30/2022 11:16:50 -------------------------------------------------------------------------------- Physical Exam Details Patient Name: Date of Service: [name], [name] 11/30/2022 10:15 A M Medical Record Number: [id number] Patient Account Number: [id number] Date of Birth/Sex: Treating RN: 1950/05/12 (72 y.o. M) Primary Care Provider: Everrett Coombe Other Clinician: Referring Provider: Treating Provider/Extender: Cheri Guppy in Treatment: 8 Constitutional respirations regular, non-labored and within target range for patient.. Cardiovascular 2+ dorsalis pedis/posterior tibialis pulses. Psychiatric pleasant and cooperative. Notes Right lower extremity: Epithelialization to the previous wound site. 2+ pitting edema to the thigh. No signs of infection. Electronic Signature(s) Signed: 11/30/2022 11:32:50 AM By: Geralyn Corwin DO Entered By: Geralyn Corwin on 11/30/2022 11:17:26 -------------------------------------------------------------------------------- Physician Orders Details Patient Name: Date of Service: [name], [name] 11/30/2022 10:15 A M Medical Record Number: [id number] Patient Account Number: [id number] Date of Birth/Sex: Treating RN: 03-05-1951 (72 y.o. [name] Primary Care Provider: Everrett Coombe Other Clinician: Referring Provider: Treating Provider/Extender: Cheri Guppy in Treatment: 8 Verbal / Phone Orders: No Diagnosis Coding Follow-up Appointments ppointment in: - 10 days Monday 12/10/2022 345pm room 8 Return A Other: - [facility] will ship out whole leg circaid juxtfit for both legs on 12/13/2022. Edema Control - Lymphedema / SCD / Other Bilateral Lower Extremities Elevate legs to the level of the heart or above for 30 minutes daily and/or when sitting for 3-4 times a day throughout the day. Avoid standing for long periods of time. Exercise regularly Moisturize legs daily. Compression stocking or Garment 30-40 mm/Hg pressure to: - thigh high compression stockings- Circaid Juxtafit whole leg both legs. wear once arrives. VON, QUINTANAR ([id number]) [id number] Page 3 of 8 Electronic Signature(s) Signed: 11/30/2022 11:32:50 AM By: Geralyn Corwin DO Entered By: Geralyn Corwin on 11/30/2022 11:17:35 -------------------------------------------------------------------------------- Problem List Details Patient Name: Date of Service: [name], [name] 11/30/2022 10:15 A M Medical Record Number: [id number] Patient Account Number: [id number] Date of Birth/Sex: Treating RN: 1950/06/28 (72 y.o. M) Primary Care Provider: Everrett Coombe Other Clinician: Referring Provider: Treating Provider/Extender: Cheri Guppy in Treatment: 8 Active Problems ICD-10 Encounter Code Description Active Date MDM Diagnosis I87.312 Chronic venous hypertension (idiopathic) with ulcer of left lower extremity 11/05/2022 No Yes I87.311 Chronic venous hypertension (idiopathic) with ulcer of right lower extremity 11/19/2022 No Yes L97.818 Non-pressure chronic ulcer of other part of right lower leg with other specified 10/04/2022 No Yes severity S81.802A Unspecified open wound, left lower leg, initial encounter 11/05/2022 No Yes I89.0 Lymphedema, not elsewhere classified 10/04/2022 No Yes G72.49 Other inflammatory and  immune myopathies, not elsewhere classified 10/04/2022 No Yes Inactive Problems Resolved Problems ICD-10 Code Description Active Date Resolved Date L89.312 Pressure ulcer of right buttock, stage 2 10/04/2022 10/04/2022 L89.322 Pressure ulcer of left buttock, stage 2 10/04/2022 10/04/2022 B37.0 Candidal stomatitis 10/04/2022 10/04/2022 Electronic Signature(s) Signed: 11/30/2022 11:32:50 AM By: Geralyn Corwin DO Entered By: Geralyn Corwin on 11/30/2022 11:14:58 Goostree, Kmarion ([id number]) [id number] Page 4 of 8 -------------------------------------------------------------------------------- Progress Note Details Patient Name: Date of Service: [name], [name] 11/30/2022 10:15 A M Medical Record Number: [id number]  Patient Account Number: [id number] Date of Birth/Sex: Treating RN: 12-30-1950 (72 y.o. M) Primary Care Provider: Everrett Coombe Other Clinician: Referring Provider: Treating Provider/Extender: Cheri Guppy in Treatment: 8 Subjective Chief Complaint Information obtained from Patient 10/04/2022; patient is here for review of multiple wounds History of Present Illness (HPI) ADMISSION 10/04/2022 This is a 72 year old man who is quite disabled because of some form of myopathy. He follows for this with neurology. He has had biopsies that showed necrosis without inflammation. 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 In any case he is reasonably immobile at home. He has this apparatus to help him transfer. Because of difficulties getting him upstairs he has been sleeping in a recliner on the main floor. He also has a history of lymphedema and I see he was followed by PT for a period of time. He has a history of combined congestive heart failure but his last echo showed an EF of 60 to 65%. He has developed a wound on the right lateral leg over the last  month or so. He also has a smaller wound on the right anterior lower leg. He is having problems with his left second toe and he has bilateral stage II ulcers over his ischial tuberosity. The patient is complaining of oral pain under his tongue. He has bilateral heel pain and increasing edema with weight gain over the past week or 2 Past medical history includes myopathy/myositis question inclusion body myositis, rheumatoid arthritis, coronary artery disease, massive pulmonary embolism, type 2 diabetes, lymphedema, combined congestive heart failure, bilateral foot drop PTSD ABIs in our clinic were 1.22 on the right 1.27 on the left 6/17; patient presents for follow-up. He has been using silver alginate to the bilateral buttocks wounds. These have healed. We have also been using silver alginate to the right lower extremity under compression therapy. He is tolerated this well. The wounds are smaller. 6/25; patient presents for follow-up. We have been using silver alginate to the right lower extremity under compression therapy. The anterior leg wound has healed. The lateral right leg wound is smaller. 7/8; patient presents for follow-up. We have been using silver alginate under compression therapy to the right lower extremity wound and this is healed. Unfortunately he has developed a blister to the left lower extremity. 7/15; patient presents for follow-up. We have been using silver alginate under 4-layer compression to the left lower extremity. His wound has healed here. Unfortunately he has developed blistering to the right lower extremity and has open wounds despite using compression therapy daily here. 7/22; patient presents for follow-up. We have been using silver alginate under 4-layer compression to the right lower extremity. He has been using his zipper compression wrap to the left lower extremity daily. 2 wounds have healed on the right lower extremity. He has 1 remaining wound to the anterior  aspect. He has no issues or complaints today. 8/2; patient presents for follow-up. We have been using silver alginate under Urgo K2 to the right lower extremity. His wounds have healed. He has zipper compression stockings to use. We have ordered him thigh-high Velcro compression wraps that are arriving in 2 weeks. Patient History Information obtained from Patient, Chart. Social History Never smoker, Marital Status - Married, Alcohol Use - Never, Drug Use - No History, Caffeine Use - Daily - coffee. Medical History Hematologic/Lymphatic Patient has history of Anemia, Lymphedema - Bilateral Lower  legs Cardiovascular Patient has history of Congestive Heart Failure, Deep Vein Thrombosis Endocrine Patient has history of Type II Diabetes Musculoskeletal Patient has history of Rheumatoid Arthritis Hospitalization/Surgery History - Biopsy left Shoulder; Angiogram Pulmonary Bilateral. Medical A Surgical History Notes nd Respiratory Hx: Pulmonary Embolism Gastrointestinal GERD Endocrine [name], [name] ([id number]) [id number] Page 5 of 8 Hx: Hypothyroidism Genitourinary VQ:QVZDGLOVFIEP Immunological PI:RJJOACZYSA disease Musculoskeletal Hx: Necrotizing Myopathy Psychiatric PTSD Objective Constitutional respirations regular, non-labored and within target range for patient.. Vitals Time Taken: 10:39 AM, Height: 68 in, Weight: 220 lbs, BMI: 33.4, Temperature: 98.1 F, Pulse: 72 bpm, Respiratory Rate: 18 breaths/min, Blood Pressure: 132/69 mmHg. Cardiovascular 2+ dorsalis pedis/posterior tibialis pulses. Psychiatric pleasant and cooperative. General Notes: Right lower extremity: Epithelialization to the previous wound site. 2+ pitting edema to the thigh. No signs of infection. Integumentary (Hair, Skin) Wound #7 status is Healed - Epithelialized. Original cause of wound was Blister. The date acquired was: 11/06/2022. The wound has been in treatment 2 weeks. The  wound is located on the Right,Anterior Lower Leg. The wound measures 0cm length x 0cm width x 0cm depth; 0cm^2 area and 0cm^3 volume. There is no tunneling or undermining noted. There is a medium amount of serosanguineous drainage noted. The wound margin is distinct with the outline attached to the wound base. There is no granulation within the wound bed. There is no necrotic tissue within the wound bed. The periwound skin appearance had no abnormalities noted for texture. The periwound skin appearance had no abnormalities noted for moisture. The periwound skin appearance had no abnormalities noted for color. Periwound temperature was noted as No Abnormality. Assessment Active Problems ICD-10 Chronic venous hypertension (idiopathic) with ulcer of left lower extremity Chronic venous hypertension (idiopathic) with ulcer of right lower extremity Non-pressure chronic ulcer of other part of right lower leg with other specified severity Unspecified open wound, left lower leg, initial encounter Lymphedema, not elsewhere classified Other inflammatory and immune myopathies, not elsewhere classified Patient has done well with silver alginate under compression therapy. His wounds of healed. I recommended going back into his zipper compression garments daily. He is at high risk for reopening and I recommended following up next week to assure that there is been no issues. He is receiving his custom compression thigh-high garments in 2 weeks and hopefully this will help offer further edema control. Plan Follow-up Appointments: Return Appointment in: - 10 days Monday 12/10/2022 345pm room 8 Other: - [facility] will ship out whole leg circaid juxtfit for both legs on 12/13/2022. Edema Control - Lymphedema / SCD / Other: Elevate legs to the level of the heart or above for 30 minutes daily and/or when sitting for 3-4 times a day throughout the day. Avoid standing for long periods of time. Exercise  regularly Moisturize legs daily. Compression stocking or Garment 30-40 mm/Hg pressure to: - thigh high compression stockings- Circaid Juxtafit whole leg both legs. wear once arrives. 1. Compression garments daily 2. Follow-up in 1 week [name], [name] ([id number]) [id number] Page 6 of 8 Electronic Signature(s) Signed: 11/30/2022 11:32:50 AM By: Geralyn Corwin DO Entered By: Geralyn Corwin on 11/30/2022 11:24:58 -------------------------------------------------------------------------------- HxROS Details Patient Name: Date of Service: [name], [name] 11/30/2022 10:15 A M Medical Record Number: [id number] Patient Account Number: [id number] Date of Birth/Sex: Treating RN: 1950-05-01 (72 y.o. M) Primary Care Provider: Everrett Coombe Other Clinician: Referring Provider: Treating Provider/Extender: Cheri Guppy in Treatment: 8 Information Obtained From Patient Chart Hematologic/Lymphatic Medical History: Positive for: Anemia; Lymphedema - Bilateral Lower legs Respiratory  Medical History: Past Medical History Notes: Hx: Pulmonary Embolism Cardiovascular Medical History: Positive for: Congestive Heart Failure; Deep Vein Thrombosis Gastrointestinal Medical History: Past Medical History Notes: GERD Endocrine Medical History: Positive for: Type II Diabetes Past Medical History Notes: Hx: Hypothyroidism Time with diabetes: 33 years Treated with: Insulin Genitourinary Medical History: Past Medical History Notes: BM:WUXLKGMWNUUV Immunological Medical History: Past Medical History Notes: OZ:DGUYQIHKVQ disease Musculoskeletal Medical History: Positive for: Rheumatoid Arthritis Past Medical History Notes: Hx: Necrotizing Myopathy Psychiatric [name], [name] ([id number]) [id number] Page 7 of 8 Medical History: Past Medical History Notes: PTSD Immunizations Pneumococcal Vaccine: Received Pneumococcal  Vaccination: Yes Received Pneumococcal Vaccination On or After 60th Birthday: No Implantable Devices None Hospitalization / Surgery History Type of Hospitalization/Surgery Biopsy left Shoulder; Angiogram Pulmonary Bilateral Family and Social History Never smoker; Marital Status - Married; Alcohol Use: Never; Drug Use: No History; Caffeine Use: Daily - coffee; Financial Concerns: No; Food, Clothing or Shelter Needs: No; Support System Lacking: No; Transportation Concerns: No Electronic Signature(s) Signed: 11/30/2022 11:32:50 AM By: Geralyn Corwin DO Entered By: Geralyn Corwin on 11/30/2022 11:16:55 -------------------------------------------------------------------------------- SuperBill Details Patient Name: Date of Service: [name], [name] 11/30/2022 Medical Record Number: [id number] Patient Account Number: [id number] Date of Birth/Sex: Treating RN: January 22, 1951 (72 y.o. [name], Yvonne Kendall Primary Care Provider: Everrett Coombe Other Clinician: Referring Provider: Treating Provider/Extender: Cheri Guppy in Treatment: 8 Diagnosis Coding ICD-10 Codes Code Description [phone] Chronic venous hypertension (idiopathic) with ulcer of left lower extremity I87.311 Chronic venous hypertension (idiopathic) with ulcer of right lower extremity L97.818 Non-pressure chronic ulcer of other part of right lower leg with other specified severity S81.802A Unspecified open wound, left lower leg, initial encounter I89.0 Lymphedema, not elsewhere classified 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, not elsewhere classified Facility Procedures : CPT4 Code: 63016010 Description: 99213 - WOUND CARE VISIT-LEV 3 EST PT Modifier: Quantity: 1 Physician Procedures : CPT4 Code Description Modifier [id number] - WC PHYS LEVEL 3 - EST PT ICD-10 Diagnosis Description I87.311 Chronic venous hypertension (idiopathic) with ulcer of right lower extremity L97.818 Non-pressure chronic  ulcer of other part of right lower  leg with other specified severity I89.0 Lymphedema, not elsewhere classified Quantity: 1 Electronic Signature(s) Signed: 11/30/2022 11:32:50 AM By: Geralyn Corwin DO Entered By: Geralyn Corwin on 11/30/2022 11:25:25 [name], [name] ([id number]) [id number] Page 8 of 8

## 2022-11-30 NOTE — Progress Notes (Signed)
[name], [name] ([id number]) [id number] Page 1 of 8 Visit Report for 11/30/2022 Arrival Information Details Patient Name: Date of Service: [name], [name] 11/30/2022 10:15 A M Medical Record Number: [id number] Patient Account Number: [id number] Date of Birth/Sex: Treating RN: [birth year] (72 y.o. M) Primary Care : Everrett Coombe Other Clinician: Referring : Treating /Extender: Cheri Guppy in Treatment: 8 Visit Information History Since Last Visit Added or deleted any medications: No Patient Arrived: Walker Any new allergies or adverse reactions: No Arrival Time: 10:16 Had a fall or experienced change in No Accompanied By: wife activities of daily living that may affect Transfer Assistance: None risk of falls: Patient Identification Verified: Yes Signs or symptoms of abuse/neglect since last visito No Secondary Verification Process Completed: Yes Hospitalized since last visit: No Patient Requires Transmission-Based Precautions: No Implantable device outside of the clinic excluding No Patient Has Alerts: Yes cellular tissue based products placed in the center Patient Alerts: ELIQUIS since last visit: Has Dressing in Place as Prescribed: Yes Has Compression in Place as Prescribed: Yes Pain Present Now: No Electronic Signature(s) Signed: 11/30/2022 11:53:17 AM By: Thayer Dallas Entered By: Thayer Dallas on 11/30/2022 10:16:30 -------------------------------------------------------------------------------- Clinic Level of Care Assessment Details Patient Name: Date of Service: [name], [name] 11/30/2022 10:15 A M Medical Record Number: [id number] Patient Account Number: [id number] Date of Birth/Sex: Treating RN: 1950-12-07 (72 y.o. [name] Primary Care : Everrett Coombe Other Clinician: Referring : Treating /Extender: Cheri Guppy in Treatment: 8 Clinic Level of  Care Assessment Items TOOL 4 Quantity Score X- 1 0 Use when only an EandM is performed on FOLLOW-UP visit ASSESSMENTS - Nursing Assessment / Reassessment X- 1 10 Reassessment of Co-morbidities (includes updates in patient status) X- 1 5 Reassessment of Adherence to Treatment Plan ASSESSMENTS - Wound and Skin A ssessment / Reassessment X - Simple Wound Assessment / Reassessment - one wound 1 5 []  - 0 Complex Wound Assessment / Reassessment - multiple wounds X- 1 10 Dermatologic / Skin Assessment (not related to wound area) ASSESSMENTS - Focused Assessment X- 1 5 Circumferential Edema Measurements - multi extremities []  - 0 Nutritional Assessment / Counseling / Intervention [name], [name] ([id number]) [id number] Page 2 of 8 []  - 0 Lower Extremity Assessment (monofilament, tuning fork, pulses) []  - 0 Peripheral Arterial Disease Assessment (using hand held doppler) ASSESSMENTS - Ostomy and/or Continence Assessment and Care []  - 0 Incontinence Assessment and Management []  - 0 Ostomy Care Assessment and Management (repouching, etc.) PROCESS - Coordination of Care X - Simple Patient / Family Education for ongoing care 1 15 []  - 0 Complex (extensive) Patient / Family Education for ongoing care X- 1 10 Staff obtains Chiropractor, Records, T Results / Process Orders est []  - 0 Staff telephones HHA, Nursing Homes / Clarify orders / etc []  - 0 Routine Transfer to another Facility (non-emergent condition) []  - 0 Routine Hospital Admission (non-emergent condition) []  - 0 New Admissions / Manufacturing engineer / Ordering NPWT Apligraf, etc. , []  - 0 Emergency Hospital Admission (emergent condition) X- 1 10 Simple Discharge Coordination []  - 0 Complex (extensive) Discharge Coordination PROCESS - Special Needs []  - 0 Pediatric / Minor Patient Management []  - 0 Isolation Patient Management []  - 0 Hearing / Language / Visual special needs []  -  0 Assessment of Community assistance (transportation, D/C planning, etc.) []  - 0 Additional assistance / Altered mentation []  - 0 Support Surface(s) Assessment (bed, cushion, seat, etc.) INTERVENTIONS - Wound Cleansing / Measurement X -  Simple Wound Cleansing - one wound 1 5 []  - 0 Complex Wound Cleansing - multiple wounds X- 1 5 Wound Imaging (photographs - any number of wounds) []  - 0 Wound Tracing (instead of photographs) X- 1 5 Simple Wound Measurement - one wound []  - 0 Complex Wound Measurement - multiple wounds INTERVENTIONS - Wound Dressings []  - 0 Small Wound Dressing one or multiple wounds []  - 0 Medium Wound Dressing one or multiple wounds []  - 0 Large Wound Dressing one or multiple wounds []  - 0 Application of Medications - topical []  - 0 Application of Medications - injection INTERVENTIONS - Miscellaneous []  - 0 External ear exam []  - 0 Specimen Collection (cultures, biopsies, blood, body fluids, etc.) []  - 0 Specimen(s) / Culture(s) sent or taken to Lab for analysis []  - 0 Patient Transfer (multiple staff / Nurse, adult / Similar devices) []  - 0 Simple Staple / Suture removal (25 or less) []  - 0 Complex Staple / Suture removal (26 or more) []  - 0 Hypo / Hyperglycemic Management (close monitor of Blood Glucose) [name], [name] ([id number]) [id number] Page 3 of 8 []  - 0 Ankle / Brachial Index (ABI) - do not check if billed separately X- 1 5 Vital Signs Has the patient been seen at the hospital within the last three years: Yes Total Score: 90 Level Of Care: New/Established - Level 3 Electronic Signature(s) Signed: 11/30/2022 11:35:15 AM By: Shawn Stall RN, BSN Entered By: Shawn Stall on 11/30/2022 11:12:04 -------------------------------------------------------------------------------- Encounter Discharge Information Details Patient Name: Date of Service: [name], [name] 11/30/2022 10:15 A M Medical Record Number:  [id number] Patient Account Number: [id number] Date of Birth/Sex: Treating RN: June 19, 1950 (72 y.o. Tammy Sours Primary Care : Everrett Coombe Other Clinician: Referring : Treating /Extender: Cheri Guppy in Treatment: 8 Encounter Discharge Information Items Discharge Condition: Stable Ambulatory Status: Walker Discharge Destination: Home Transportation: Private Auto Accompanied By: wife Schedule Follow-up Appointment: Yes Clinical Summary of Care: Electronic Signature(s) Signed: 11/30/2022 11:35:15 AM By: Shawn Stall RN, BSN Entered By: Shawn Stall on 11/30/2022 11:12:31 -------------------------------------------------------------------------------- Lower Extremity Assessment Details Patient Name: Date of Service: MESSIAH, AHR 11/30/2022 10:15 A M Medical Record Number: [id number] Patient Account Number: [id number] Date of Birth/Sex: Treating RN: February 17, 1951 (72 y.o. M) Primary Care : Everrett Coombe Other Clinician: Referring : Treating /Extender: Cheri Guppy in Treatment: 8 Edema Assessment Assessed: [Left: No] [Right: No] Edema: [Left: Yes] [Right: No] Calf Left: Right: Point of Measurement: 37 cm From Medial Instep 47.5 cm Ankle Left: Right: Point of Measurement: 10 cm From Medial Instep 26 cm Electronic Signature(s) Witherington, Fares ([id number]) [id number] Page 4 of 8 Signed: 11/30/2022 11:53:17 AM By: Thayer Dallas Entered By: Thayer Dallas on 11/30/2022 10:30:32 -------------------------------------------------------------------------------- Multi Wound Chart Details Patient Name: Date of Service: [name], [name] 11/30/2022 10:15 A M Medical Record Number: [id number] Patient Account Number: [id number] Date of Birth/Sex: Treating RN: 1950-10-20 (72 y.o. M) Primary Care : Everrett Coombe Other Clinician: Referring : Treating  /Extender: Cheri Guppy in Treatment: 8 Vital Signs Height(in): 68 Pulse(bpm): 72 Weight(lbs): 220 Blood Pressure(mmHg): 132/69 Body Mass Index(BMI): 33.4 Temperature(F): 98.1 Respiratory Rate(breaths/min): 18 [7:Photos:] [N/A:N/A] Right, Anterior Lower Leg N/A N/A Wound Location: Blister N/A N/A Wounding Event: Lymphedema N/A N/A Primary Etiology: Anemia, Lymphedema, Congestive N/A N/A Comorbid History: Heart Failure, Deep Vein Thrombosis, Type II Diabetes, Rheumatoid Arthritis 11/06/2022 N/A N/A Date Acquired: 2 N/A N/A Weeks of Treatment: Healed - Epithelialized N/A N/A Wound Status: No  N/A N/A Wound Recurrence: 0x0x0 N/A N/A Measurements L x W x D (cm) 0 N/A N/A A (cm) : rea 0 N/A N/A Volume (cm) : 100.00% N/A N/A % Reduction in Area: 100.00% N/A N/A % Reduction in Volume: Full Thickness Without Exposed N/A N/A Classification: Support Structures Medium N/A N/A Exudate Amount: Serosanguineous N/A N/A Exudate Type: red, brown N/A N/A Exudate Color: Distinct, outline attached N/A N/A Wound Margin: None Present (0%) N/A N/A Granulation Amount: None Present (0%) N/A N/A Necrotic Amount: Fascia: No N/A N/A Exposed Structures: Fat Layer (Subcutaneous Tissue): No Tendon: No Muscle: No Joint: No Bone: No Large (67-100%) N/A N/A Epithelialization: No Abnormalities Noted N/A N/A Periwound Skin Texture: No Abnormalities Noted N/A N/A Periwound Skin Moisture: No Abnormalities Noted N/A N/A Periwound Skin Color: No Abnormality N/A N/A Temperature: Treatment Notes Wound #7 (Lower Leg) Wound Laterality: Right, Anterior Cleanser Larrivee, Kalix ([id number]) [id number] Page 5 of 8 Peri-Wound Care Topical Primary Dressing Secondary Dressing Secured With Compression Wrap Compression Stockings Add-Ons Electronic Signature(s) Signed: 11/30/2022 11:32:50 AM By: Geralyn Corwin DO Entered By:  Geralyn Corwin on 11/30/2022 11:15:04 -------------------------------------------------------------------------------- Multi-Disciplinary Care Plan Details Patient Name: Date of Service: [name], [name] 11/30/2022 10:15 A M Medical Record Number: [id number] Patient Account Number: [id number] Date of Birth/Sex: Treating RN: 01-25-1951 (72 y.o. Tammy Sours Primary Care : Everrett Coombe Other Clinician: Referring : Treating /Extender: Cheri Guppy in Treatment: 8 Active Inactive Wound/Skin Impairment Nursing Diagnoses: Impaired tissue integrity Goals: Patient/caregiver will verbalize understanding of skin care regimen Date Initiated: 10/04/2022 Target Resolution Date: 12/29/2022 Goal Status: Active Interventions: Assess ulceration(s) every visit Treatment Activities: Skin care regimen initiated : 10/04/2022 Notes: Electronic Signature(s) Signed: 11/30/2022 11:35:15 AM By: Shawn Stall RN, BSN Entered By: Shawn Stall on 11/30/2022 11:11:18 -------------------------------------------------------------------------------- Pain Assessment Details Patient Name: Date of Service: [name], [name] 11/30/2022 10:15 A M Medical Record Number: [id number] Patient Account Number: [id number] Date of Birth/Sex: Treating RN: 08/28/1950 (72 y.o. M) Primary Care : Everrett Coombe Other Clinician: [name] ([id number]) [id number] Page 6 of 8 Referring : Treating /Extender: Cheri Guppy in Treatment: 8 Active Problems Location of Pain Severity and Description of Pain Patient Has Paino No Site Locations Pain Management and Medication Current Pain Management: Electronic Signature(s) Signed: 11/30/2022 11:53:17 AM By: Thayer Dallas Entered By: Thayer Dallas on 11/30/2022 10:16:39 -------------------------------------------------------------------------------- Patient/Caregiver  Education Details Patient Name: Date of Service: [name] 8/2/2024andnbsp10:15 A M Medical Record Number: [id number] Patient Account Number: [id number] Date of Birth/Gender: Treating RN: Sep 09, 1950 (72 y.o. Tammy Sours Primary Care Physician: Everrett Coombe Other Clinician: Referring Physician: Treating Physician/Extender: Cheri Guppy in Treatment: 8 Education Assessment Education Provided To: Patient Education Topics Provided Wound/Skin Impairment: Handouts: Caring for Your Ulcer Methods: Explain/Verbal Responses: Reinforcements needed Electronic Signature(s) Signed: 11/30/2022 11:35:15 AM By: Shawn Stall RN, BSN Entered By: Shawn Stall on 11/30/2022 11:11:31 Hillier, [name] ([id number]) [id number] Page 7 of 8 -------------------------------------------------------------------------------- Wound Assessment Details Patient Name: Date of Service: THAO, [name] 11/30/2022 10:15 A M Medical Record Number: [id number] Patient Account Number: [id number] Date of Birth/Sex: Treating RN: 1950-08-06 (72 y.o. Tammy Sours Primary Care : Everrett Coombe Other Clinician: Referring : Treating /Extender: Cheri Guppy in Treatment: 8 Wound Status Wound Number: 7 Primary Lymphedema Etiology: Wound Location: Right, Anterior Lower Leg Wound Healed - Epithelialized Wounding Event: Blister Status: Date Acquired: 11/06/2022 Comorbid Anemia, Lymphedema, Congestive Heart Failure, Deep Vein Weeks Of Treatment: 2 History: Thrombosis, Type  II Diabetes, Rheumatoid Arthritis Clustered Wound: No Photos Wound Measurements Length: (cm) Width: (cm) Depth: (cm) Area: (cm) Volume: (cm) 0 % Reduction in Area: 100% 0 % Reduction in Volume: 100% 0 Epithelialization: Large (67-100%) 0 Tunneling: No 0 Undermining: No Wound Description Classification: Full Thickness Without Exposed Support Wound  Margin: Distinct, outline attached Exudate Amount: Medium Exudate Type: Serosanguineous Exudate Color: red, brown Structures Foul Odor After Cleansing: No Slough/Fibrino No Wound Bed Granulation Amount: None Present (0%) Exposed Structure Necrotic Amount: None Present (0%) Fascia Exposed: No Fat Layer (Subcutaneous Tissue) Exposed: No Tendon Exposed: No Muscle Exposed: No Joint Exposed: No Bone Exposed: No Periwound Skin Texture Texture Color No Abnormalities Noted: Yes No Abnormalities Noted: Yes Moisture Temperature / Pain No Abnormalities Noted: Yes Temperature: No Abnormality Treatment Notes Wound #7 (Lower Leg) Wound Laterality: Right, Anterior Cleanser Peri-Wound Care Arpino, [name] ([id number]) [id number] Page 8 of 8 Topical Primary Dressing Secondary Dressing Secured With Compression Wrap Compression Stockings Add-Ons Electronic Signature(s) Signed: 11/30/2022 11:35:15 AM By: Shawn Stall RN, BSN Entered By: Shawn Stall on 11/30/2022 11:08:39 -------------------------------------------------------------------------------- Vitals Details Patient Name: Date of Service: [name] 11/30/2022 10:15 A M Medical Record Number: [id number] Patient Account Number: [id number] Date of Birth/Sex: Treating RN: 1950/05/20 (72 y.o. M) Primary Care : Everrett Coombe Other Clinician: Referring : Treating /Extender: Cheri Guppy in Treatment: 8 Vital Signs Time Taken: 10:39 Temperature (F): 98.1 Height (in): 68 Pulse (bpm): 72 Weight (lbs): 220 Respiratory Rate (breaths/min): 18 Body Mass Index (BMI): 33.4 Blood Pressure (mmHg): 132/69 Reference Range: 80 - 120 mg / dl Electronic Signature(s) Signed: 11/30/2022 11:53:17 AM By: Thayer Dallas Entered By: Thayer Dallas on 11/30/2022 10:39:47

## 2022-12-05 ENCOUNTER — Ambulatory Visit: Payer: Medicare Other | Admitting: Family Medicine

## 2022-12-05 ENCOUNTER — Encounter: Payer: Self-pay | Admitting: Family Medicine

## 2022-12-05 VITALS — BP 118/63 | HR 85 | Ht 68.0 in | Wt 223.0 lb

## 2022-12-05 DIAGNOSIS — L209 Atopic dermatitis, unspecified: Secondary | ICD-10-CM

## 2022-12-05 DIAGNOSIS — Z794 Long term (current) use of insulin: Secondary | ICD-10-CM

## 2022-12-05 DIAGNOSIS — Z7901 Long term (current) use of anticoagulants: Secondary | ICD-10-CM | POA: Insufficient documentation

## 2022-12-05 DIAGNOSIS — N3281 Overactive bladder: Secondary | ICD-10-CM | POA: Diagnosis not present

## 2022-12-05 DIAGNOSIS — I5042 Chronic combined systolic (congestive) and diastolic (congestive) heart failure: Secondary | ICD-10-CM | POA: Diagnosis not present

## 2022-12-05 DIAGNOSIS — E1165 Type 2 diabetes mellitus with hyperglycemia: Secondary | ICD-10-CM

## 2022-12-05 DIAGNOSIS — I10 Essential (primary) hypertension: Secondary | ICD-10-CM

## 2022-12-05 DIAGNOSIS — G7249 Other inflammatory and immune myopathies, not elsewhere classified: Secondary | ICD-10-CM | POA: Diagnosis not present

## 2022-12-05 MED ORDER — PRESERVISION AREDS 2 PO CAPS: ORAL_CAPSULE | ORAL | 3 refills | Status: DC

## 2022-12-05 MED ORDER — TRIAMCINOLONE ACETONIDE 0.1 % EX CREA: 1.0000 | TOPICAL_CREAM | Freq: Two times a day (BID) | CUTANEOUS | 0 refills | Status: DC

## 2022-12-05 MED ORDER — MIRABEGRON ER 25 MG PO TB24: 25.0000 mg | ORAL_TABLET | Freq: Every day | ORAL | 1 refills | Status: DC

## 2022-12-05 NOTE — Patient Instructions (Addendum)
Try Myrbetriq for overactive bladder

## 2022-12-08 DIAGNOSIS — M25511 Pain in right shoulder: Secondary | ICD-10-CM | POA: Diagnosis not present

## 2022-12-08 DIAGNOSIS — R000 Tachycardia, unspecified: Secondary | ICD-10-CM | POA: Diagnosis not present

## 2022-12-08 DIAGNOSIS — M25512 Pain in left shoulder: Secondary | ICD-10-CM | POA: Diagnosis not present

## 2022-12-08 DIAGNOSIS — R4182 Altered mental status, unspecified: Secondary | ICD-10-CM | POA: Diagnosis not present

## 2022-12-08 DIAGNOSIS — M19011 Primary osteoarthritis, right shoulder: Secondary | ICD-10-CM | POA: Diagnosis not present

## 2022-12-08 DIAGNOSIS — I6782 Cerebral ischemia: Secondary | ICD-10-CM | POA: Diagnosis not present

## 2022-12-08 DIAGNOSIS — I890 Lymphedema, not elsewhere classified: Secondary | ICD-10-CM | POA: Diagnosis not present

## 2022-12-08 DIAGNOSIS — R410 Disorientation, unspecified: Secondary | ICD-10-CM | POA: Diagnosis not present

## 2022-12-08 DIAGNOSIS — R600 Localized edema: Secondary | ICD-10-CM | POA: Diagnosis not present

## 2022-12-08 DIAGNOSIS — R7989 Other specified abnormal findings of blood chemistry: Secondary | ICD-10-CM | POA: Diagnosis not present

## 2022-12-09 ENCOUNTER — Encounter: Payer: Self-pay | Admitting: Family Medicine

## 2022-12-09 ENCOUNTER — Emergency Department (HOSPITAL_BASED_OUTPATIENT_CLINIC_OR_DEPARTMENT_OTHER)
Admission: EM | Admit: 2022-12-09 | Discharge: 2022-12-09 | Discharge status: Against Medical Advice | Payer: No Typology Code available for payment source | Attending: Emergency Medicine | Admitting: Emergency Medicine

## 2022-12-09 ENCOUNTER — Encounter (HOSPITAL_BASED_OUTPATIENT_CLINIC_OR_DEPARTMENT_OTHER): Payer: Self-pay | Admitting: Emergency Medicine

## 2022-12-09 DIAGNOSIS — R55 Syncope and collapse: Secondary | ICD-10-CM | POA: Diagnosis not present

## 2022-12-09 DIAGNOSIS — R5383 Other fatigue: Secondary | ICD-10-CM | POA: Insufficient documentation

## 2022-12-09 DIAGNOSIS — M25511 Pain in right shoulder: Secondary | ICD-10-CM | POA: Diagnosis not present

## 2022-12-09 DIAGNOSIS — Z5321 Procedure and treatment not carried out due to patient leaving prior to being seen by health care provider: Secondary | ICD-10-CM | POA: Diagnosis not present

## 2022-12-09 DIAGNOSIS — M19011 Primary osteoarthritis, right shoulder: Secondary | ICD-10-CM | POA: Diagnosis not present

## 2022-12-09 DIAGNOSIS — L209 Atopic dermatitis, unspecified: Secondary | ICD-10-CM

## 2022-12-09 DIAGNOSIS — N3281 Overactive bladder: Secondary | ICD-10-CM | POA: Insufficient documentation

## 2022-12-09 HISTORY — DX: Atopic dermatitis, unspecified: L20.9

## 2022-12-09 NOTE — Assessment & Plan Note (Signed)
Rash with eczematous type appearance.  Trial of triamcinolone.

## 2022-12-09 NOTE — Assessment & Plan Note (Signed)
He does see dermatology on a regular basis.  Remains on prednisone.

## 2022-12-09 NOTE — Progress Notes (Signed)
[name] - 72 y.o. male MRN [id number]  Date of birth: 23-Aug-1950  Subjective Chief Complaint  Patient presents with   Follow-up    HPI [name] is a 72 year old male here today for follow-up visit.  Overall he reports he is doing well.  Swelling in his lower extremities has improved.  He has been using a lymphatic compression device as well as compression stockings.  Blood pressure is well-controlled at this time.  He denies chest pain, dyspnea increase headaches.  Recently prescribed Gemtesa for overactive bladder.  He reports that this caused GI upset.  He would like to try an alternative to this.  He does have rash on upper shoulders and back.  Rash is itchy.  He has not really tried anything other than moisturizer for this.  Continues to see endocrinology for management of these.  He remains on Lantus and metformin.  He is on chronic steroids for management of his chronic autoimmune myopathy  ROS:  A comprehensive ROS was completed and negative except as noted per HPI  Allergies  Allergen Reactions   Benazepril Hives and Cough   Gabapentin Swelling   Hydrocodone Hives and Itching   Shellfish Allergy Diarrhea and Nausea And Vomiting    Past Medical History:  Diagnosis Date   Atopic dermatitis 12/09/2022   Autoimmune disease (HCC)    Bronchitis    Chronic HFrEF (heart failure with reduced ejection fraction) (HCC)    normalized EF   Chronic hiccups    Chronic kidney disease, stage 3a (HCC)    DM2 (diabetes mellitus, type 2) (HCC) 09/28/2014   DVT (deep venous thrombosis) (HCC)    GERD (gastroesophageal reflux disease)    Hypogonadism in male 04/01/2018   Hypothyroidism    Joint pain    Necrotizing myopathy    Nonobstructive atherosclerosis of coronary artery    Posttraumatic stress disorder 01/30/2022   PTSD (post-traumatic stress disorder)    Pulmonary embolism (HCC)    Rheumatoid arthritis (HCC) 08/09/2021   RSV (respiratory syncytial virus pneumonia)  03/11/2022    Past Surgical History:  Procedure Laterality Date   BIOPSY SHOULDER     Left   IR ANGIOGRAM PULMONARY BILATERAL SELECTIVE  07/22/2021   IR THROMBECT VENO MECH MOD SED  07/22/2021   IR US GUIDE VASC ACCESS LEFT  07/22/2021   RADIOLOGY WITH ANESTHESIA Right 07/22/2021   Procedure: IR WITH ANESTHESIA;  Surgeon: Radiologist, Medication, MD;  Location: [facility] OR;  Service: Radiology;  Laterality: Right;   UPPER LEG SOFT TISSUE BIOPSY Right     Social History   Socioeconomic History   Marital status: Married    Spouse name: Kim   Number of children: 4   Years of education: 16   Highest education level: Master's degree (e.g., MA, MS, MEng, MEd, MSW, MBA)  Occupational History   Occupation: Retired  Tobacco Use   Smoking status: Never   Smokeless tobacco: Never  Vaping Use   Vaping status: Never Used  Substance and Sexual Activity   Alcohol use: No   Drug use: No   Sexual activity: Not on file  Other Topics Concern   Not on file  Social History Narrative   Lives at home with his wife. He enjoys playing sodoku, playing cards and reading.   Social Determinants of Health   Financial Resource Strain: Low Risk  (08/14/2022)   Overall Financial Resource Strain (CARDIA)    Difficulty of Paying Living Expenses: Not hard at all  Food Insecurity: No Food  Insecurity (09/13/2022)   Hunger Vital Sign    Worried About Running Out of Food in the Last Year: Never true    Ran Out of Food in the Last Year: Never true  Transportation Needs: No Transportation Needs (09/13/2022)   PRAPARE - Administrator, Civil Service (Medical): No    Lack of Transportation (Non-Medical): No  Physical Activity: Insufficiently Active (08/14/2022)   Exercise Vital Sign    Days of Exercise per Week: 3 days    Minutes of Exercise per Session: 20 min  Stress: No Stress Concern Present (08/14/2022)   [employer] of Occupational Health - Occupational Stress Questionnaire    Feeling of Stress  : Not at all  Social Connections: Socially Isolated (08/14/2022)   Social Connection and Isolation Panel [NHANES]    Frequency of Communication with Friends and Family: Once a week    Frequency of Social Gatherings with Friends and Family: Never    Attends Religious Services: Never    Database administrator or Organizations: No    Attends Engineer, structural: Never    Marital Status: Married    Family History  Problem Relation Age of Onset   Hypertension Mother    Diabetes Mother    Arthritis Mother    Depression Mother    Heart attack Father    Hypertension Father     Health Maintenance  Topic Date Due   Diabetic kidney evaluation - Urine ACR  Never done   Zoster Vaccines- Shingrix (1 of 2) Never done   DTaP/Tdap/Td (2 - Td or Tdap) 08/19/2021   COVID-19 Vaccine (6 - 2023-24 season) 12/29/2021   FOOT EXAM  11/15/2022   INFLUENZA VACCINE  11/29/2022   Colonoscopy  08/14/2023 (Originally 10/28/1993)   HEMOGLOBIN A1C  03/16/2023   OPHTHALMOLOGY EXAM  06/01/2023   Medicare Annual Wellness (AWV)  08/14/2023   Diabetic kidney evaluation - eGFR measurement  09/13/2023   Pneumonia Vaccine 57+ Years old  Completed   Hepatitis C Screening  Completed   HPV VACCINES  Aged Out     ----------------------------------------------------------------------------------------------------------------------------------------------------------------------------------------------------------------- Physical Exam BP 118/63 (BP Location: Left Arm, Patient Position: Sitting, Cuff Size: Normal)   Pulse 85   Ht 5\' 8"  (1.727 m)   Wt 223 lb (101.2 kg)   SpO2 98%   BMI 33.91 kg/m   Physical Exam Constitutional:      Appearance: Normal appearance.     Comments: He is in a wheelchair.  HENT:     Head: Normocephalic and atraumatic.  Cardiovascular:     Rate and Rhythm: Normal rate and regular rhythm.  Pulmonary:     Effort: Pulmonary effort is normal.     Breath sounds: Normal breath  sounds.  Musculoskeletal:     Cervical back: Neck supple.  Skin:    Comments: Scattered, somewhat scaly hyperpigmented patches on the shoulders and upper back  Neurological:     Mental Status: He is alert.  Psychiatric:        Mood and Affect: Mood normal.        Behavior: Behavior normal.     ------------------------------------------------------------------------------------------------------------------------------------------------------------------------------------------------------------------- Assessment and Plan  Benign essential HTN Blood pressure is well-controlled at this time.  He will continue current medications for management of hypertension.  Chronic combined systolic and diastolic CHF (congestive heart failure) (HCC) Management per cardiology.  Remains on Demadex as needed.  Also using lymphatic compression device for chronic lymphedema.  DM2 (diabetes mellitus, type 2) (HCC) Blood sugars have  been pretty well-controlled.  He continues to see endocrinology for management of his diabetes.  Autoimmune myopathy He does see dermatology on a regular basis.  Remains on prednisone.  Overactive bladder He has had some GI side effects from [place].  Will see if Myrbetriq is better tolerated.  Atopic dermatitis Rash with eczematous type appearance.  Trial of triamcinolone.   Meds ordered this encounter  Medications   Multiple Vitamins-Minerals (PRESERVISION AREDS 2) CAPS    Sig: Take 2 caps po daily    Dispense:  90 capsule    Refill:  3   mirabegron ER (MYRBETRIQ) 25 MG TB24 tablet    Sig: Take 1 tablet (25 mg total) by mouth daily.    Dispense:  90 tablet    Refill:  1   triamcinolone cream (KENALOG) 0.1 %    Sig: Apply 1 Application topically 2 (two) times daily. Apply bid as needed for rash    Dispense:  80 g    Refill:  0    No follow-ups on file.    This visit occurred during the SARS-CoV-2 public health emergency.  Safety protocols were in place,  including screening questions prior to the visit, additional usage of staff PPE, and extensive cleaning of exam room while observing appropriate contact time as indicated for disinfecting solutions.

## 2022-12-09 NOTE — Assessment & Plan Note (Signed)
He has had some GI side effects from [place].  Will see if Myrbetriq is better tolerated.

## 2022-12-09 NOTE — Assessment & Plan Note (Signed)
Blood sugars have been pretty well-controlled.  He continues to see endocrinology for management of his diabetes.

## 2022-12-09 NOTE — ED Triage Notes (Signed)
Pt's wife reports low BP readings at home (normal readings x 2 here); they also report fatigue; pt denies any sxs; pt keeps eyes closed during triage, but family and pt say this is normal; pt was evaluated at [facility] yesterday and was discharged

## 2022-12-09 NOTE — Assessment & Plan Note (Signed)
Blood pressure is well-controlled at this time.  He will continue current medications for management of hypertension.

## 2022-12-09 NOTE — Assessment & Plan Note (Signed)
Management per cardiology.  Remains on Demadex as needed.  Also using lymphatic compression device for chronic lymphedema.

## 2022-12-10 ENCOUNTER — Encounter (HOSPITAL_BASED_OUTPATIENT_CLINIC_OR_DEPARTMENT_OTHER): Payer: Medicare Other | Admitting: Internal Medicine

## 2022-12-10 ENCOUNTER — Ambulatory Visit (HOSPITAL_BASED_OUTPATIENT_CLINIC_OR_DEPARTMENT_OTHER): Payer: Medicare Other | Admitting: Family

## 2022-12-10 DIAGNOSIS — I87311 Chronic venous hypertension (idiopathic) with ulcer of right lower extremity: Secondary | ICD-10-CM | POA: Diagnosis not present

## 2022-12-10 DIAGNOSIS — S81801A Unspecified open wound, right lower leg, initial encounter: Secondary | ICD-10-CM | POA: Diagnosis not present

## 2022-12-10 DIAGNOSIS — I87312 Chronic venous hypertension (idiopathic) with ulcer of left lower extremity: Secondary | ICD-10-CM | POA: Diagnosis not present

## 2022-12-10 DIAGNOSIS — S81802A Unspecified open wound, left lower leg, initial encounter: Secondary | ICD-10-CM | POA: Diagnosis not present

## 2022-12-10 DIAGNOSIS — L97818 Non-pressure chronic ulcer of other part of right lower leg with other specified severity: Secondary | ICD-10-CM | POA: Diagnosis not present

## 2022-12-10 DIAGNOSIS — G7249 Other inflammatory and immune myopathies, not elsewhere classified: Secondary | ICD-10-CM | POA: Diagnosis not present

## 2022-12-10 DIAGNOSIS — I890 Lymphedema, not elsewhere classified: Secondary | ICD-10-CM | POA: Diagnosis not present

## 2022-12-11 ENCOUNTER — Encounter (HOSPITAL_BASED_OUTPATIENT_CLINIC_OR_DEPARTMENT_OTHER): Payer: Self-pay

## 2022-12-11 NOTE — Progress Notes (Signed)
[name], [name] ([id number]) [id number] Page 1 of 9 Visit Report for 12/10/2022 Chief Complaint Document Details Patient Name: Date of Service: [name], [name] 12/10/2022 3:45 PM Medical Record Number: [id number] Patient Account Number: [id number] Date of Birth/Sex: Treating RN: 05/12/1950 (72 y.o. M) Primary Care Provider: Everrett Coombe Other Clinician: Referring Provider: Treating Provider/Extender: Cheri Guppy in Treatment: 9 Information Obtained from: Patient Chief Complaint 10/04/2022; patient is here for review of multiple wounds Electronic Signature(s) Signed: 12/10/2022 4:52:09 PM By: Geralyn Corwin DO Entered By: Geralyn Corwin on 12/10/2022 16:46:18 -------------------------------------------------------------------------------- HPI Details Patient Name: Date of Service: [name], [name] 12/10/2022 3:45 PM Medical Record Number: [id number] Patient Account Number: [id number] Date of Birth/Sex: Treating RN: [birth year] (72 y.o. M) Primary Care Provider: Everrett Coombe Other Clinician: Referring Provider: Treating Provider/Extender: Cheri Guppy in Treatment: 9 History of Present Illness HPI Description: ADMISSION 10/04/2022 This is a 72 year old man who is quite disabled because of some form of myopathy. He follows for this with neurology. He has had biopsies that showed necrosis without inflammation. 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 In any case he is reasonably immobile at home. He has this apparatus to help him transfer. Because of difficulties getting him upstairs he has been sleeping in a recliner on the main floor. He also has a history of lymphedema and I see he was followed by PT for a period of time. He has a history of combined congestive heart failure but his last echo showed an EF of 60 to  65%. He has developed a wound on the right lateral leg over the last month or so. He also has a smaller wound on the right anterior lower leg. He is having problems with his left second toe and he has bilateral stage II ulcers over his ischial tuberosity. The patient is complaining of oral pain under his tongue. He has bilateral heel pain and increasing edema with weight gain over the past week or 2 Past medical history includes myopathy/myositis question inclusion body myositis, rheumatoid arthritis, coronary artery disease, massive pulmonary embolism, type 2 diabetes, lymphedema, combined congestive heart failure, bilateral foot drop PTSD ABIs in our clinic were 1.22 on the right 1.27 on the left 6/17; patient presents for follow-up. He has been using silver alginate to the bilateral buttocks wounds. These have healed. We have also been using silver alginate to the right lower extremity under compression therapy. He is tolerated this well. The wounds are smaller. 6/25; patient presents for follow-up. We have been using silver alginate to the right lower extremity under compression therapy. The anterior leg wound has healed. The lateral right leg wound is smaller. 7/8; patient presents for follow-up. We have been using silver alginate under compression therapy to the right lower extremity wound and this is healed. Unfortunately he has developed a blister to the left lower extremity. 7/15; patient presents for follow-up. We have been using silver alginate under 4-layer compression to the left lower extremity. His wound has healed here. Unfortunately he has developed blistering to the right lower extremity and has open wounds despite using compression therapy daily here. [name], [name] ([id number]) [id number] Page 2 of 9 7/22; patient presents for follow-up. We have been using silver alginate under 4-layer compression to the right lower extremity. He has been using his  zipper compression wrap to the left lower extremity daily. 2 wounds have healed on  the right lower extremity. He has 1 remaining wound to the anterior aspect. He has no issues or complaints today. 8/2; patient presents for follow-up. We have been using silver alginate under Urgo K2 to the right lower extremity. His wounds have healed. He has zipper compression stockings to use. We have ordered him thigh-high Velcro compression wraps that are arriving in 2 weeks. 8/12; patient presents for follow-up. Unfortunately he has reopened wounds on both lower extremities. He had to stop his diuretic due to low blood pressure And now his lower extremities are more edematous.. He has received his thigh-high Velcro compression wraps and lymphedema pumps. Electronic Signature(s) Signed: 12/10/2022 4:52:09 PM By: Geralyn Corwin DO Entered By: Geralyn Corwin on 12/10/2022 16:47:10 -------------------------------------------------------------------------------- Physical Exam Details Patient Name: Date of Service: [name], [name] 12/10/2022 3:45 PM Medical Record Number: [id number] Patient Account Number: [id number] Date of Birth/Sex: Treating RN: 11-06-1950 (72 y.o. M) Primary Care Provider: Everrett Coombe Other Clinician: Referring Provider: Treating Provider/Extender: Cheri Guppy in Treatment: 9 Constitutional respirations regular, non-labored and within target range for patient.. Cardiovascular 2+ dorsalis pedis/posterior tibialis pulses. Psychiatric pleasant and cooperative. Notes Right lower extremity: Blisters and open wounds limited to skin breakdown to the lower extremities bilaterally. 2+ pitting edema to the thigh. No signs of infection. Electronic Signature(s) Signed: 12/10/2022 4:52:09 PM By: Geralyn Corwin DO Entered By: Geralyn Corwin on 12/10/2022 16:47:40 -------------------------------------------------------------------------------- Physician Orders  Details Patient Name: Date of Service: [name], [name] 12/10/2022 3:45 PM Medical Record Number: [id number] Patient Account Number: [id number] Date of Birth/Sex: Treating RN: 06/24/1950 (72 y.o. [name], [name] Primary Care Provider: Everrett Coombe Other Clinician: Referring Provider: Treating Provider/Extender: Cheri Guppy in Treatment: 9 Verbal / Phone Orders: No Diagnosis Coding ICD-10 Coding Code Description I87.312 Chronic venous hypertension (idiopathic) with ulcer of left lower extremity I87.311 Chronic venous hypertension (idiopathic) with ulcer of right lower extremity S81.802A Unspecified open wound, left lower leg, initial encounter S81.801A Unspecified open wound, right lower leg, initial encounter Goldin, Kirklin ([id number]) [id number] Page 3 of 9 I89.0 Lymphedema, not elsewhere classified 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 - Dr. Mikey Bussing Thursday 1015 Room 9 12/20/2022 Return A Other: - Bring in whole juxtafits for both legs next week. You may wear the thigh juxtafit now apply in the morning and at night. Bathing/ Shower/ Hygiene May shower with protection but do not get wound dressing(s) wet. Protect dressing(s) with water repellant cover (for example, large plastic bag) or a cast cover and may then take shower. Edema Control - Lymphedema / SCD / Other Bilateral Lower Extremities Lymphedema Pumps. Use Lymphedema pumps on leg(s) 2-3 times a day for 45-60 minutes. If wearing any wraps or hose, do not remove them. Continue exercising as instructed. Elevate legs to the level of the heart or above for 30 minutes daily and/or when sitting for 3-4 times a day throughout the day. Avoid standing for long periods of time. Exercise regularly Moisturize legs daily. Compression stocking or Garment 30-40 mm/Hg pressure to: - thigh high compression  stockings- Circaid Juxtafit whole leg both legs. bring in next week. Wound Treatment Wound #10 - Lower Leg Wound Laterality: Left, Anterior Cleanser: Soap and Water 1 x Per Week/30 Days Discharge Instructions: May shower and wash wound with dial antibacterial soap and water prior to dressing change. Cleanser: Wound Cleanser 1 x Per Week/30 Days Discharge Instructions: Cleanse the wound with wound cleanser prior to applying a clean  dressing using gauze sponges, not tissue or cotton balls. Peri-Wound Care: Sween Lotion (Moisturizing lotion) 1 x Per Week/30 Days Discharge Instructions: Apply moisturizing lotion as directed Prim Dressing: Calcium alignate Ag 1 x Per Week/30 Days ary Discharge Instructions: apply directly to wound bed. Secondary Dressing: ABD Pad, 5x9 1 x Per Week/30 Days Discharge Instructions: Apply over primary dressing as directed. Secondary Dressing: CarboFLEX Odor Control Dressing, 4x4 in 1 x Per Week/30 Days Discharge Instructions: Apply over primary dressing as directed. Compression Wrap: Urgo K2, (equivalent to a 4 layer) two layer compression system, regular 1 x Per Week/30 Days Discharge Instructions: Apply Urgo K2 as directed (alternative to 4 layer compression). Wound #11 - Lower Leg Wound Laterality: Right, Lateral Cleanser: Soap and Water 1 x Per Week/30 Days Discharge Instructions: May shower and wash wound with dial antibacterial soap and water prior to dressing change. Cleanser: Wound Cleanser 1 x Per Week/30 Days Discharge Instructions: Cleanse the wound with wound cleanser prior to applying a clean dressing using gauze sponges, not tissue or cotton balls. Peri-Wound Care: Sween Lotion (Moisturizing lotion) 1 x Per Week/30 Days Discharge Instructions: Apply moisturizing lotion as directed Prim Dressing: Calcium alignate Ag 1 x Per Week/30 Days ary Discharge Instructions: apply directly to wound bed. Secondary Dressing: ABD Pad, 5x9 1 x Per Week/30  Days Discharge Instructions: Apply over primary dressing as directed. Secondary Dressing: CarboFLEX Odor Control Dressing, 4x4 in 1 x Per Week/30 Days Discharge Instructions: Apply over primary dressing as directed. Compression Wrap: Urgo K2, (equivalent to a 4 layer) two layer compression system, regular 1 x Per Week/30 Days Discharge Instructions: Apply Urgo K2 as directed (alternative to 4 layer compression). Electronic Signature(s) Signed: 12/10/2022 4:52:09 PM By: Geralyn Corwin DO Signed: 12/10/2022 5:31:38 PM By: Shawn Stall RN, BSN Entered By: Shawn Stall on 12/10/2022 16:48:02 Defilippo, [name] ([id number]) [id number] Page 4 of 9 -------------------------------------------------------------------------------- Problem List Details Patient Name: Date of Service: JAEDEN, SCHNITTKER 12/10/2022 3:45 PM Medical Record Number: [id number] Patient Account Number: [id number] Date of Birth/Sex: Treating RN: January 27, 1951 (72 y.o. Tammy Sours Primary Care Provider: Everrett Coombe Other Clinician: Referring Provider: Treating Provider/Extender: Cheri Guppy in Treatment: 9 Active Problems ICD-10 Encounter Code Description Active Date MDM Diagnosis I87.312 Chronic venous hypertension (idiopathic) with ulcer of left lower extremity 11/05/2022 No Yes I87.311 Chronic venous hypertension (idiopathic) with ulcer of right lower extremity 11/19/2022 No Yes S81.802A Unspecified open wound, left lower leg, initial encounter 11/05/2022 No Yes S81.801A Unspecified open wound, right lower leg, initial encounter 12/10/2022 No Yes I89.0 Lymphedema, not elsewhere classified 10/04/2022 No Yes 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, not elsewhere classified 10/04/2022 No Yes Inactive Problems Resolved Problems ICD-10 Code Description Active Date Resolved Date L89.312 Pressure ulcer of right buttock, stage 2 10/04/2022 10/04/2022 L89.322 Pressure ulcer of left  buttock, stage 2 10/04/2022 10/04/2022 B37.0 Candidal stomatitis 10/04/2022 10/04/2022 L97.818 Non-pressure chronic ulcer of other part of right lower leg with other specified severity 10/04/2022 10/04/2022 Electronic Signature(s) Signed: 12/10/2022 4:52:09 PM By: Geralyn Corwin DO Entered By: Geralyn Corwin on 12/10/2022 16:41:09 Gupton, [name] ([id number]) [id number] Page 5 of 9 -------------------------------------------------------------------------------- Progress Note Details Patient Name: Date of Service: [name], [name] 12/10/2022 3:45 PM Medical Record Number: [id number] Patient Account Number: [id number] Date of Birth/Sex: Treating RN: [birth year] (71 y.o. M) Primary Care Provider: Everrett Coombe Other Clinician: Referring Provider: Treating Provider/Extender: Cheri Guppy in Treatment: 9 Subjective Chief Complaint Information obtained from Patient 10/04/2022; patient is here for review of  multiple wounds History of Present Illness (HPI) ADMISSION 10/04/2022 This is a 72 year old man who is quite disabled because of some form of myopathy. He follows for this with neurology. He has had biopsies that showed necrosis without inflammation. 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 In any case he is reasonably immobile at home. He has this apparatus to help him transfer. Because of difficulties getting him upstairs he has been sleeping in a recliner on the main floor. He also has a history of lymphedema and I see he was followed by PT for a period of time. He has a history of combined congestive heart failure but his last echo showed an EF of 60 to 65%. He has developed a wound on the right lateral leg over the last month or so. He also has a smaller wound on the right anterior lower leg. He is having problems with his left second toe and he has  bilateral stage II ulcers over his ischial tuberosity. The patient is complaining of oral pain under his tongue. He has bilateral heel pain and increasing edema with weight gain over the past week or 2 Past medical history includes myopathy/myositis question inclusion body myositis, rheumatoid arthritis, coronary artery disease, massive pulmonary embolism, type 2 diabetes, lymphedema, combined congestive heart failure, bilateral foot drop PTSD ABIs in our clinic were 1.22 on the right 1.27 on the left 6/17; patient presents for follow-up. He has been using silver alginate to the bilateral buttocks wounds. These have healed. We have also been using silver alginate to the right lower extremity under compression therapy. He is tolerated this well. The wounds are smaller. 6/25; patient presents for follow-up. We have been using silver alginate to the right lower extremity under compression therapy. The anterior leg wound has healed. The lateral right leg wound is smaller. 7/8; patient presents for follow-up. We have been using silver alginate under compression therapy to the right lower extremity wound and this is healed. Unfortunately he has developed a blister to the left lower extremity. 7/15; patient presents for follow-up. We have been using silver alginate under 4-layer compression to the left lower extremity. His wound has healed here. Unfortunately he has developed blistering to the right lower extremity and has open wounds despite using compression therapy daily here. 7/22; patient presents for follow-up. We have been using silver alginate under 4-layer compression to the right lower extremity. He has been using his zipper compression wrap to the left lower extremity daily. 2 wounds have healed on the right lower extremity. He has 1 remaining wound to the anterior aspect. He has no issues or complaints today. 8/2; patient presents for follow-up. We have been using silver alginate under Urgo K2  to the right lower extremity. His wounds have healed. He has zipper compression stockings to use. We have ordered him thigh-high Velcro compression wraps that are arriving in 2 weeks. 8/12; patient presents for follow-up. Unfortunately he has reopened wounds on both lower extremities. He had to stop his diuretic due to low blood pressure And now his lower extremities are more edematous.. He has received his thigh-high Velcro compression wraps and lymphedema pumps. Patient History Information obtained from Patient, Chart. Social History Never smoker, Marital Status - Married, Alcohol Use - Never, Drug Use - No History, Caffeine Use - Daily - coffee. Medical History Hematologic/Lymphatic Patient has history of Anemia, Lymphedema - Bilateral Lower legs Cardiovascular  Patient has history of Congestive Heart Failure, Deep Vein Thrombosis Endocrine Patient has history of Type II Diabetes Musculoskeletal Patient has history of Rheumatoid Arthritis Hospitalization/Surgery History - Biopsy left Shoulder; Angiogram Pulmonary Bilateral. Medical A Surgical History Notes nd Respiratory Hx: Pulmonary Embolism Gastrointestinal GERD Carnell, Tegh ([id number]) [id number] Page 6 of 9 Endocrine Hx: Hypothyroidism Genitourinary WN:[id number] Immunological KV:QQVZDGLOVF disease Musculoskeletal Hx: Necrotizing Myopathy Psychiatric PTSD Objective Constitutional respirations regular, non-labored and within target range for patient.. Vitals Time Taken: 4:34 PM, Height: 68 in, Weight: 220 lbs, BMI: 33.4, Temperature: 98 F, Pulse: 84 bpm, Respiratory Rate: 20 breaths/min, Blood Pressure: 158/80 mmHg. Cardiovascular 2+ dorsalis pedis/posterior tibialis pulses. Psychiatric pleasant and cooperative. General Notes: Right lower extremity: Blisters and open wounds limited to skin breakdown to the lower extremities bilaterally. 2+ pitting edema to the thigh. No signs of  infection. Integumentary (Hair, Skin) Wound #10 status is Open. Original cause of wound was Blister. The date acquired was: 12/10/2022. The wound is located on the Left,Anterior Lower Leg. The wound measures 2cm length x 2cm width x 0.1cm depth; 3.142cm^2 area and 0.314cm^3 volume. There is Fat Layer (Subcutaneous Tissue) exposed. There is no tunneling or undermining noted. There is a medium amount of serosanguineous drainage noted. The wound margin is distinct with the outline attached to the wound base. There is large (67-100%) granulation within the wound bed. There is no necrotic tissue within the wound bed. The periwound skin appearance did not exhibit: Callus, Crepitus, Excoriation, Induration, Rash, Scarring, Dry/Scaly, Maceration, Atrophie Blanche, Cyanosis, Ecchymosis, Hemosiderin Staining, Mottled, Pallor, Rubor, Erythema. Wound #11 status is Open. Original cause of wound was Blister. The date acquired was: 12/10/2022. The wound is located on the Right,Lateral Lower Leg. The wound measures 1cm length x 1cm width x 0.1cm depth; 0.785cm^2 area and 0.079cm^3 volume. There is Fat Layer (Subcutaneous Tissue) exposed. There is no tunneling or undermining noted. There is a medium amount of serosanguineous drainage noted. The wound margin is distinct with the outline attached to the wound base. There is large (67-100%) red granulation within the wound bed. There is no necrotic tissue within the wound bed. The periwound skin appearance did not exhibit: Callus, Crepitus, Excoriation, Induration, Rash, Scarring, Dry/Scaly, Maceration, Atrophie Blanche, Cyanosis, Ecchymosis, Hemosiderin Staining, Mottled, Pallor, Rubor, Erythema. Periwound temperature was noted as No Abnormality. Assessment Active Problems ICD-10 Chronic venous hypertension (idiopathic) with ulcer of left lower extremity Chronic venous hypertension (idiopathic) with ulcer of right lower extremity Unspecified open wound, left lower  leg, initial encounter Unspecified open wound, right lower leg, initial encounter Lymphedema, not elsewhere classified Other inflammatory and immune myopathies, not elsewhere classified Unfortunately patient has more swelling on exam today. He is developed some blisters and open wounds limited to skin breakdown. I debrided devitalized tissue. I recommended silver alginate under 4-layer compression to lower extremities bilaterally. Follow-up in 1 week. Procedures Wound #10 Pre-procedure diagnosis of Wound #10 is a Lymphedema located on the Left,Anterior Lower Leg . There was a Double Layer Compression Therapy Procedure by Shawn Stall, RN. Post procedure Diagnosis Wound #10: Same as Pre-Procedure Wound #11 Carnathan, Chester ([id number]) [id number] Page 7 of 9 Pre-procedure diagnosis of Wound #11 is a Lymphedema located on the Right,Lateral Lower Leg . There was a Double Layer Compression Therapy Procedure by Shawn Stall, RN. Post procedure Diagnosis Wound #11: Same as Pre-Procedure Plan 1. Silver alginate under 4-layer compression to the lower extremities bilaterally 2. Removal of devitalized tissue from blisters 3. Follow-up in 1 week Electronic Signature(s) Signed: 12/10/2022 4:52:09  PM By: Geralyn Corwin DO Entered By: Geralyn Corwin on 12/10/2022 16:48:45 -------------------------------------------------------------------------------- HxROS Details Patient Name: Date of Service: [name], [name] 12/10/2022 3:45 PM Medical Record Number: [id number] Patient Account Number: [id number] Date of Birth/Sex: Treating RN: 03-16-1951 (72 y.o. M) Primary Care Provider: Everrett Coombe Other Clinician: Referring Provider: Treating Provider/Extender: Cheri Guppy in Treatment: 9 Information Obtained From Patient Chart Hematologic/Lymphatic Medical History: Positive for: Anemia; Lymphedema - Bilateral Lower legs Respiratory Medical  History: Past Medical History Notes: Hx: Pulmonary Embolism Cardiovascular Medical History: Positive for: Congestive Heart Failure; Deep Vein Thrombosis Gastrointestinal Medical History: Past Medical History Notes: GERD Endocrine Medical History: Positive for: Type II Diabetes Past Medical History Notes: Hx: Hypothyroidism Time with diabetes: 33 years Treated with: Insulin Genitourinary Medical History: Past Medical History Notes: [id number] Whorley, [name] ([id number]) [id number] Page 8 of 9 Immunological Medical History: Past Medical History Notes: QI:ONGEXBMWUX disease Musculoskeletal Medical History: Positive for: Rheumatoid Arthritis Past Medical History Notes: Hx: Necrotizing Myopathy Psychiatric Medical History: Past Medical History Notes: PTSD Immunizations Pneumococcal Vaccine: Received Pneumococcal Vaccination: Yes Received Pneumococcal Vaccination On or After 60th Birthday: No Implantable Devices None Hospitalization / Surgery History Type of Hospitalization/Surgery Biopsy left Shoulder; Angiogram Pulmonary Bilateral Family and Social History Never smoker; Marital Status - Married; Alcohol Use: Never; Drug Use: No History; Caffeine Use: Daily - coffee; Financial Concerns: No; Food, Clothing or Shelter Needs: No; Support System Lacking: No; Transportation Concerns: No Electronic Signature(s) Signed: 12/10/2022 4:52:09 PM By: Geralyn Corwin DO Entered By: Geralyn Corwin on 12/10/2022 16:47:15 -------------------------------------------------------------------------------- SuperBill Details Patient Name: Date of Service: [name] 12/10/2022 Medical Record Number: [id number] Patient Account Number: [id number] Date of Birth/Sex: Treating RN: December 27, 1950 (72 y.o. [name], Yvonne Kendall Primary Care Provider: Everrett Coombe Other Clinician: Referring Provider: Treating Provider/Extender: Cheri Guppy  in Treatment: 9 Diagnosis Coding ICD-10 Codes Code Description [phone] Chronic venous hypertension (idiopathic) with ulcer of left lower extremity I87.311 Chronic venous hypertension (idiopathic) with ulcer of right lower extremity S81.802A Unspecified open wound, left lower leg, initial encounter S81.801A Unspecified open wound, right lower leg, initial encounter I89.0 Lymphedema, not elsewhere classified 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, not elsewhere classified Facility Procedures : [name], [name]: Code [id number] UDAR ([id number] Description: 81 BILATERAL: Application of multi-layer venous compression system; leg (below knee), including ankle and t. 346) [id number] Modifier: [id number] Quantity: 1 [id number].pdf Page 9 of 9 Physician Procedures : CPT4 Code Description Modifier [id number] [phone] - WC PHYS LEVEL 3 - EST PT ICD-10 Diagnosis Description I87.312 Chronic venous hypertension (idiopathic) with ulcer of left lower extremity I87.311 Chronic venous hypertension (idiopathic) with ulcer of  right lower extremity S81.802A Unspecified open wound, left lower leg, initial encounter S81.801A Unspecified open wound, right lower leg, initial encounter Quantity: 1 Electronic Signature(s) Signed: 12/10/2022 4:52:09 PM By: Geralyn Corwin DO Entered By: Geralyn Corwin on 12/10/2022 16:49:00

## 2022-12-11 NOTE — Telephone Encounter (Signed)
Please advise 

## 2022-12-14 NOTE — Progress Notes (Signed)
[name] ([id number]) [id number] Page 1 of 9 Visit Report for 12/10/2022 Arrival Information Details Patient Name: Date of Service: [name], [name] 12/10/2022 3:45 PM Medical Record Number: [id number] Patient Account Number: [id number] Date of Birth/Sex: Treating RN: November 21, 1950 (72 y.o. M) Primary Care Melainie Krinsky: Everrett Coombe Other Clinician: Referring Landa Mullinax: Treating Jahniya Duzan/Extender: Cheri Guppy in Treatment: 9 Visit Information History Since Last Visit Added or deleted any medications: No Patient Arrived: Dan Humphreys Any new allergies or adverse reactions: No Arrival Time: 16:25 Had a fall or experienced change in No Accompanied By: wife activities of daily living that may affect Transfer Assistance: None risk of falls: Patient Identification Verified: Yes Signs or symptoms of abuse/neglect since last visito No Secondary Verification Process Completed: Yes Hospitalized since last visit: No Patient Requires Transmission-Based Precautions: No Implantable device outside of the clinic excluding No Patient Has Alerts: Yes cellular tissue based products placed in the center Patient Alerts: ELIQUIS since last visit: Pain Present Now: No Notes has both pumps and juxtafite whole leg. Electronic Signature(s) Signed: 12/10/2022 5:31:38 PM By: Shawn Stall RN, BSN Entered By: Shawn Stall on 12/10/2022 16:33:43 -------------------------------------------------------------------------------- Compression Therapy Details Patient Name: Date of Service: [name] 12/10/2022 3:45 PM Medical Record Number: [id number] Patient Account Number: [id number] Date of Birth/Sex: Treating RN: 1951/03/13 (72 y.o. Tammy Sours Primary Care Elisandra Deshmukh: Everrett Coombe Other Clinician: Referring Lathen Seal: Treating Terrian Sentell/Extender: Cheri Guppy in Treatment: 9 Compression Therapy Performed for Wound Assessment: Wound #10  Left,Anterior Lower Leg Performed By: Clinician Shawn Stall, RN Compression Type: Double Layer Post Procedure Diagnosis Same as Pre-procedure Electronic Signature(s) Signed: 12/10/2022 5:31:38 PM By: Shawn Stall RN, BSN Entered By: Shawn Stall on 12/10/2022 16:44:24 Elk, [name] ([id number]) [id number] Page 2 of 9 -------------------------------------------------------------------------------- Compression Therapy Details Patient Name: Date of Service: [name] 12/10/2022 3:45 PM Medical Record Number: [id number] Patient Account Number: [id number] Date of Birth/Sex: Treating RN: 15-Oct-1950 (72 y.o. Tammy Sours Primary Care Willie Loy: Everrett Coombe Other Clinician: Referring Jaxan Michel: Treating Kiari Hosmer/Extender: Cheri Guppy in Treatment: 9 Compression Therapy Performed for Wound Assessment: Wound #11 Right,Lateral Lower Leg Performed By: Clinician Shawn Stall, RN Compression Type: Double Layer Post Procedure Diagnosis Same as Pre-procedure Electronic Signature(s) Signed: 12/10/2022 5:31:38 PM By: Shawn Stall RN, BSN Entered By: Shawn Stall on 12/10/2022 16:44:24 -------------------------------------------------------------------------------- Encounter Discharge Information Details Patient Name: Date of Service: [name] 12/10/2022 3:45 PM Medical Record Number: [id number] Patient Account Number: [id number] Date of Birth/Sex: Treating RN: [birth year] (72 y.o. Tammy Sours Primary Care Avyn Aden: Everrett Coombe Other Clinician: Referring Mahalie Kanner: Treating Amarie Viles/Extender: Cheri Guppy in Treatment: 9 Encounter Discharge Information Items Discharge Condition: Stable Ambulatory Status: Wheelchair Discharge Destination: Home Transportation: Private Auto Accompanied By: wife Schedule Follow-up Appointment: Yes Clinical Summary of Care: Electronic Signature(s) Signed: 12/10/2022  5:31:38 PM By: Shawn Stall RN, BSN Entered By: Shawn Stall on 12/10/2022 16:53:14 -------------------------------------------------------------------------------- Lower Extremity Assessment Details Patient Name: Date of Service: [name] 12/10/2022 3:45 PM Medical Record Number: [id number] Patient Account Number: [id number] Date of Birth/Sex: Treating RN: 1951-01-12 (72 y.o. Tammy Sours Primary Care Rayvn Rickerson: Everrett Coombe Other Clinician: Referring Burna Atlas: Treating Weslynn Ke/Extender: Cheri Guppy in Treatment: 9 Edema Assessment Left: [Left: Right] [Right: :] Assessed: [Left: Yes] [Right: Yes] Edema: [Left: Yes] [Right: Yes] Calf Left: Right: Point of Measurement: 37 cm From Medial Instep 49 cm 48.5 cm Ankle Left: Right: Point of Measurement: 10 cm From Medial Instep 29.4 cm 29.5  cm Vascular Assessment Pulses: Dorsalis Pedis Palpable: [Left:Yes] [Right:Yes] Extremity colors, hair growth, and conditions: Extremity Color: [Left:Normal] [Right:Normal] Hair Growth on Extremity: [Left:No] [Right:No] Temperature of Extremity: [Left:Warm] [Right:Warm] Capillary Refill: [Left:< 3 seconds] [Right:< 3 seconds] Dependent Rubor: [Left:No] [Right:No] Blanched when Elevated: [Left:No No] [Right:No No] Toe Nail Assessment Left: Right: Thick: No No Discolored: No No Deformed: No No Improper Length and Hygiene: No No Electronic Signature(s) Signed: 12/10/2022 5:31:38 PM By: Shawn Stall RN, BSN Entered By: Shawn Stall on 12/10/2022 16:34:54 -------------------------------------------------------------------------------- Multi Wound Chart Details Patient Name: Date of Service: [name] 12/10/2022 3:45 PM Medical Record Number: [id number] Patient Account Number: [id number] Date of Birth/Sex: Treating RN: 07-12-1950 (72 y.o. M) Primary Care Jeannette Maddy: Everrett Coombe Other Clinician: Referring Derelle Cockrell: Treating Kaylanie Capili/Extender: Cheri Guppy in Treatment: 9 Vital Signs Height(in): 68 Pulse(bpm): 84 Weight(lbs): 220 Blood Pressure(mmHg): 158/80 Body Mass Index(BMI): 33.4 Temperature(F): 98 Respiratory Rate(breaths/min): 20 [10:Photos:] [N/A:N/A] [name] ([id number]) [10:Left, Anterior Lower Leg Wound Location: Blister Wounding Event: Lymphedema Primary Etiology: Anemia, Lymphedema, Congestive Anemia, Lymphedema, Congestive N/A Comorbid History: Heart Failure, Deep Vein Thrombosis, Heart Failure, Deep Vein  Thrombosis, Type II Diabetes, Rheumatoid Arthritis Type II Diabetes, Rheumatoid Arthritis 12/10/2022 Date Acquired: 0 Weeks of Treatment: Open Wound Status: No Wound Recurrence: 2x2x0.1 Measurements L x W x D (cm) 3.142 A (cm) : rea 0.314 Volume (cm) :  Full Thickness Without Exposed Classification: Support Structures Medium Exudate Amount: Serosanguineous Exudate Type: red, brown Exudate Color: Distinct, outline attached Wound Margin: Large (67-100%) Granulation Amount: N/A Granulation Quality: None  Present (0%) Necrotic Amount: Fat Layer (Subcutaneous Tissue): Yes Fat Layer (Subcutaneous Tissue): Yes N/A Exposed Structures: Fascia: No Tendon: No Muscle: No Joint: No Bone: No Small (1-33%) Epithelialization: Excoriation: No Periwound Skin Texture:  Induration: No Callus: No Crepitus: No Rash: No Scarring: No Maceration: No Periwound Skin Moisture: Dry/Scaly: No Atrophie Blanche: No Periwound Skin Color: Cyanosis: No Ecchymosis: No Erythema: No Hemosiderin Staining: No Mottled: No Pallor: No Rubor:  No N/A Temperature: Compression Therapy Procedures Performed:] [11:Right, Lateral Lower Leg Blister Lymphedema 12/10/2022 0 Open No 1x1x0.1 0.785 0.079 Full Thickness Without Exposed Support Structures Medium Serosanguineous red, brown Distinct, outline  attached Large (67-100%) Red None Present (0%) Fascia: No Tendon: No Muscle: No Joint: No Bone: No Small (1-33%) Excoriation: No Induration: No Callus: No  Crepitus: No Rash: No Scarring: No Maceration: No Dry/Scaly: No Atrophie Blanche: No Cyanosis: No  Ecchymosis: No Erythema: No Hemosiderin Staining: No Mottled: No Pallor: No Rubor: No No Abnormality Compression Therapy] [N/A:[id number] Page 4 of 9 N/A N/A N/A N/A N/A N/A N/A N/A N/A N/A N/A N/A N/A N/A N/A N/A N/A N/A N/A  N/A N/A N/A N/A N/A] Treatment Notes Electronic Signature(s) Signed: 12/10/2022 4:52:09 PM By: Geralyn Corwin DO Entered By: Geralyn Corwin on 12/10/2022 16:46:08 -------------------------------------------------------------------------------- Multi-Disciplinary Care Plan Details Patient Name: Date of Service: JERMARIO, SPROSS 12/10/2022 3:45 PM Medical Record Number: [id number] Patient Account Number: [id number] Date of Birth/Sex: Treating RN: 06-26-1950 (72 y.o. Tammy Sours Primary Care Jermell Holeman: Everrett Coombe Other Clinician: Referring Tilmon Wisehart: Treating Tracey Stewart/Extender: Cheri Guppy in Treatment: 9 Active Inactive Wound/Skin Impairment Nursing Diagnoses: Impaired tissue integrity GoalsDARDAN, ZIETLOW ([id number]) [id number] Page 5 of 9 Patient/caregiver will verbalize understanding of skin care regimen Date Initiated: 10/04/2022 Target Resolution Date: 01/25/2023 Goal Status: Active Interventions: Assess ulceration(s) every visit Treatment Activities: Skin care regimen initiated : 10/04/2022 Notes: Electronic Signature(s) Signed: 12/10/2022 5:31:38 PM By: Shawn Stall RN, BSN Entered  By: Shawn Stall on 12/10/2022 16:38:32 -------------------------------------------------------------------------------- Pain Assessment Details Patient Name: Date of Service: JEANMICHEL, FORSELL 12/10/2022 3:45 PM Medical Record Number: [id number] Patient Account Number: [id number] Date of Birth/Sex: Treating RN: Sep 19, 1950 (72 y.o. Tammy Sours Primary Care Kennethia Lynes: Everrett Coombe Other  Clinician: Referring Joyceann Kruser: Treating Tatum Corl/Extender: Cheri Guppy in Treatment: 9 Active Problems Location of Pain Severity and Description of Pain Patient Has Paino No Site Locations Pain Management and Medication Current Pain Management: Electronic Signature(s) Signed: 12/10/2022 5:31:38 PM By: Shawn Stall RN, BSN Entered By: Shawn Stall on 12/10/2022 16:33:20 -------------------------------------------------------------------------------- Patient/Caregiver Education Details Patient Name: Date of Service: CARLIS, KIPPS 8/12/2024andnbsp3:45 PM Alberto, [name] ([id number]) [id number] Page 6 of 9 Medical Record Number: [id number] Patient Account Number: [id number] Date of Birth/Gender: Treating RN: [birth year] (72 y.o. Tammy Sours Primary Care Physician: Everrett Coombe Other Clinician: Referring Physician: Treating Physician/Extender: Cheri Guppy in Treatment: 9 Education Assessment Education Provided To: Patient Education Topics Provided Wound/Skin Impairment: Handouts: Caring for Your Ulcer Methods: Explain/Verbal Responses: Reinforcements needed Electronic Signature(s) Signed: 12/10/2022 5:31:38 PM By: Shawn Stall RN, BSN Entered By: Shawn Stall on 12/10/2022 16:38:44 -------------------------------------------------------------------------------- Wound Assessment Details Patient Name: Date of Service: TANDY, LUCKADOO 12/10/2022 3:45 PM Medical Record Number: [id number] Patient Account Number: [id number] Date of Birth/Sex: Treating RN: June 10, 1950 (72 y.o. Tammy Sours Primary Care Candelario Steppe: Everrett Coombe Other Clinician: Referring Kaleyah Labreck: Treating Albana Saperstein/Extender: Cheri Guppy in Treatment: 9 Wound Status Wound Number: 10 Primary Lymphedema Etiology: Wound Location: Left, Anterior Lower Leg Wound Open Wounding Event: Blister Status: Date  Acquired: 12/10/2022 Comorbid Anemia, Lymphedema, Congestive Heart Failure, Deep Vein Weeks Of Treatment: 0 History: Thrombosis, Type II Diabetes, Rheumatoid Arthritis Clustered Wound: No Photos Wound Measurements Length: (cm) 2 Width: (cm) 2 Depth: (cm) 0.1 Area: (cm) 3.142 Volume: (cm) 0.314 % Reduction in Area: % Reduction in Volume: Epithelialization: Small (1-33%) Tunneling: No Undermining: No Wound Description Classification: Full Thickness Without Exposed Support Structures Wound Margin: Distinct, outline attached Exudate Amount: Medium [place], Hilary ([id number]) Exudate Type: Serosanguineous Exudate Color: red, brown Foul Odor After Cleansing: No Slough/Fibrino No [id number] Page 7 of 9 Wound Bed Granulation Amount: Large (67-100%) Exposed Structure Necrotic Amount: None Present (0%) Fascia Exposed: No Fat Layer (Subcutaneous Tissue) Exposed: Yes Tendon Exposed: No Muscle Exposed: No Joint Exposed: No Bone Exposed: No Periwound Skin Texture Texture Color No Abnormalities Noted: No No Abnormalities Noted: No Callus: No Atrophie Blanche: No Crepitus: No Cyanosis: No Excoriation: No Ecchymosis: No Induration: No Erythema: No Rash: No Hemosiderin Staining: No Scarring: No Mottled: No Pallor: No Moisture Rubor: No No Abnormalities Noted: No Dry / Scaly: No Maceration: No Treatment Notes Wound #10 (Lower Leg) Wound Laterality: Left, Anterior Cleanser Soap and Water Discharge Instruction: May shower and wash wound with dial antibacterial soap and water prior to dressing change. Wound Cleanser Discharge Instruction: Cleanse the wound with wound cleanser prior to applying a clean dressing using gauze sponges, not tissue or cotton balls. Peri-Wound Care Sween Lotion (Moisturizing lotion) Discharge Instruction: Apply moisturizing lotion as directed Topical Primary Dressing Calcium alignate Ag Discharge Instruction: apply  directly to wound bed. Secondary Dressing ABD Pad, 5x9 Discharge Instruction: Apply over primary dressing as directed. CarboFLEX Odor Control Dressing, 4x4 in Discharge Instruction: Apply over primary dressing as directed. Secured With Compression Wrap Urgo K2, (equivalent to a 4 layer) two layer compression system, regular Discharge Instruction: Apply Urgo K2 as directed (alternative to 4 layer compression). Compression Stockings Add-Ons Electronic  Signature(s) Signed: 12/10/2022 5:31:38 PM By: Shawn Stall RN, BSN Signed: 12/14/2022 12:35:38 PM By: Thayer Dallas Entered By: Thayer Dallas on 12/10/2022 16:38:07 Seales, [name] ([id number]) [id number] Page 8 of 9 -------------------------------------------------------------------------------- Wound Assessment Details Patient Name: Date of Service: THURMAN, LAUMANN 12/10/2022 3:45 PM Medical Record Number: [id number] Patient Account Number: [id number] Date of Birth/Sex: Treating RN: 12/03/1950 (72 y.o. Tammy Sours Primary Care Remijio Holleran: Everrett Coombe Other Clinician: Referring Dewayne Jurek: Treating Wilho Sharpley/Extender: Cheri Guppy in Treatment: 9 Wound Status Wound Number: 11 Primary Lymphedema Etiology: Wound Location: Right, Lateral Lower Leg Wound Open Wounding Event: Blister Status: Date Acquired: 12/10/2022 Comorbid Anemia, Lymphedema, Congestive Heart Failure, Deep Vein Weeks Of Treatment: 0 History: Thrombosis, Type II Diabetes, Rheumatoid Arthritis Clustered Wound: No Photos Wound Measurements Length: (cm) 1 Width: (cm) 1 Depth: (cm) 0.1 Area: (cm) 0.785 Volume: (cm) 0.079 % Reduction in Area: % Reduction in Volume: Epithelialization: Small (1-33%) Tunneling: No Undermining: No Wound Description Classification: Full Thickness Without Exposed Suppor Wound Margin: Distinct, outline attached Exudate Amount: Medium Exudate Type: Serosanguineous Exudate Color: red,  brown t Structures Foul Odor After Cleansing: No Slough/Fibrino No Wound Bed Granulation Amount: Large (67-100%) Exposed Structure Granulation Quality: Red Fascia Exposed: No Necrotic Amount: None Present (0%) Fat Layer (Subcutaneous Tissue) Exposed: Yes Tendon Exposed: No Muscle Exposed: No Joint Exposed: No Bone Exposed: No Periwound Skin Texture Texture Color No Abnormalities Noted: No No Abnormalities Noted: No Callus: No Atrophie Blanche: No Crepitus: No Cyanosis: No Excoriation: No Ecchymosis: No Induration: No Erythema: No Rash: No Hemosiderin Staining: No Scarring: No Mottled: No Pallor: No Moisture Rubor: No No Abnormalities Noted: No Dry / Scaly: No Temperature / Pain Maceration: No Temperature: No Abnormality Treatment Notes Wound #11 (Lower Leg) Wound Laterality: Right, Lateral Cleanser Million, [place] ([id number]) [id number] Page 9 of 9 Soap and Water Discharge Instruction: May shower and wash wound with dial antibacterial soap and water prior to dressing change. Wound Cleanser Discharge Instruction: Cleanse the wound with wound cleanser prior to applying a clean dressing using gauze sponges, not tissue or cotton balls. Peri-Wound Care Sween Lotion (Moisturizing lotion) Discharge Instruction: Apply moisturizing lotion as directed Topical Primary Dressing Calcium alignate Ag Discharge Instruction: apply directly to wound bed. Secondary Dressing ABD Pad, 5x9 Discharge Instruction: Apply over primary dressing as directed. CarboFLEX Odor Control Dressing, 4x4 in Discharge Instruction: Apply over primary dressing as directed. Secured With Compression Wrap Urgo K2, (equivalent to a 4 layer) two layer compression system, regular Discharge Instruction: Apply Urgo K2 as directed (alternative to 4 layer compression). Compression Stockings Add-Ons Electronic Signature(s) Signed: 12/10/2022 5:31:38 PM By: Shawn Stall RN,  BSN Signed: 12/14/2022 12:35:38 PM By: Thayer Dallas Entered By: Thayer Dallas on 12/10/2022 16:39:20 -------------------------------------------------------------------------------- Vitals Details Patient Name: Date of Service: DORIN, XIE 12/10/2022 3:45 PM Medical Record Number: [id number] Patient Account Number: [id number] Date of Birth/Sex: Treating RN: Sep 17, 1950 (72 y.o. [name], Yvonne Kendall Primary Care Erian Lariviere: Everrett Coombe Other Clinician: Referring Mallorie Norrod: Treating Rayburn Mundis/Extender: Cheri Guppy in Treatment: 9 Vital Signs Time Taken: 16:34 Temperature (F): 98 Height (in): 68 Pulse (bpm): 84 Weight (lbs): 220 Respiratory Rate (breaths/min): 20 Body Mass Index (BMI): 33.4 Blood Pressure (mmHg): 158/80 Reference Range: 80 - 120 mg / dl Electronic Signature(s) Signed: 12/10/2022 5:31:38 PM By: Shawn Stall RN, BSN Entered By: Shawn Stall on 12/10/2022 16:35:03

## 2022-12-20 ENCOUNTER — Encounter (HOSPITAL_BASED_OUTPATIENT_CLINIC_OR_DEPARTMENT_OTHER): Payer: Medicare Other | Admitting: Internal Medicine

## 2022-12-20 DIAGNOSIS — I87311 Chronic venous hypertension (idiopathic) with ulcer of right lower extremity: Secondary | ICD-10-CM

## 2022-12-20 DIAGNOSIS — I87312 Chronic venous hypertension (idiopathic) with ulcer of left lower extremity: Secondary | ICD-10-CM | POA: Diagnosis not present

## 2022-12-20 DIAGNOSIS — I890 Lymphedema, not elsewhere classified: Secondary | ICD-10-CM | POA: Diagnosis not present

## 2022-12-20 DIAGNOSIS — G7249 Other inflammatory and immune myopathies, not elsewhere classified: Secondary | ICD-10-CM | POA: Diagnosis not present

## 2022-12-20 DIAGNOSIS — S81801A Unspecified open wound, right lower leg, initial encounter: Secondary | ICD-10-CM | POA: Diagnosis not present

## 2022-12-20 DIAGNOSIS — S81802A Unspecified open wound, left lower leg, initial encounter: Secondary | ICD-10-CM

## 2022-12-20 DIAGNOSIS — L97818 Non-pressure chronic ulcer of other part of right lower leg with other specified severity: Secondary | ICD-10-CM | POA: Diagnosis not present

## 2022-12-24 NOTE — Progress Notes (Signed)
TU, BRITTING ([id number]) [id number] Page 1 of 9 Visit Report for 12/20/2022 Chief Complaint Document Details Patient Name: Date of Service: [name], [name] 12/20/2022 10:15 A M Medical Record Number: [id number] Patient Account Number: [id number] Date of Birth/Sex: Treating RN: May 10, 1950 (72 y.o. M) Primary Care Provider: Everrett Coombe Other Clinician: Referring Provider: Treating Provider/Extender: Cheri Guppy in Treatment: 11 Information Obtained from: Patient Chief Complaint 10/04/2022; patient is here for review of multiple wounds Electronic Signature(s) Signed: 12/24/2022 1:35:31 PM By: Geralyn Corwin DO Entered By: Geralyn Corwin on 12/20/2022 08:23:09 -------------------------------------------------------------------------------- HPI Details Patient Name: Date of Service: [name] 12/20/2022 10:15 A M Medical Record Number: [id number] Patient Account Number: [id number] Date of Birth/Sex: Treating RN: 01-Feb-1951 (72 y.o. M) Primary Care Provider: Everrett Coombe Other Clinician: Referring Provider: Treating Provider/Extender: Cheri Guppy in Treatment: 11 History of Present Illness HPI Description: ADMISSION 10/04/2022 This is a 72 year old man who is quite disabled because of some form of myopathy. He follows for this with neurology. He has had biopsies that showed necrosis without inflammation. 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 In any case he is reasonably immobile at home. He has this apparatus to help him transfer. Because of difficulties getting him upstairs he has been sleeping in a recliner on the main floor. He also has a history of lymphedema and I see he was followed by PT for a period of time. He has a history of combined congestive heart failure but his last echo showed an EF of  60 to 65%. He has developed a wound on the right lateral leg over the last month or so. He also has a smaller wound on the right anterior lower leg. He is having problems with his left second toe and he has bilateral stage II ulcers over his ischial tuberosity. The patient is complaining of oral pain under his tongue. He has bilateral heel pain and increasing edema with weight gain over the past week or 2 Past medical history includes myopathy/myositis question inclusion body myositis, rheumatoid arthritis, coronary artery disease, massive pulmonary embolism, type 2 diabetes, lymphedema, combined congestive heart failure, bilateral foot drop PTSD ABIs in our clinic were 1.22 on the right 1.27 on the left 6/17; patient presents for follow-up. He has been using silver alginate to the bilateral buttocks wounds. These have healed. We have also been using silver alginate to the right lower extremity under compression therapy. He is tolerated this well. The wounds are smaller. 6/25; patient presents for follow-up. We have been using silver alginate to the right lower extremity under compression therapy. The anterior leg wound has healed. The lateral right leg wound is smaller. 7/8; patient presents for follow-up. We have been using silver alginate under compression therapy to the right lower extremity wound and this is healed. Unfortunately he has developed a blister to the left lower extremity. 7/15; patient presents for follow-up. We have been using silver alginate under 4-layer compression to the left lower extremity. His wound has healed here. Unfortunately he has developed blistering to the right lower extremity and has open wounds despite using compression therapy daily here. [name], [name] ([id number]) [id number] Page 2 of 9 7/22; patient presents for follow-up. We have been using silver alginate under 4-layer compression to the right lower extremity. He has been using his  zipper compression wrap to the left lower extremity daily. 2 wounds have  healed on the right lower extremity. He has 1 remaining wound to the anterior aspect. He has no issues or complaints today. 8/2; patient presents for follow-up. We have been using silver alginate under Urgo K2 to the right lower extremity. His wounds have healed. He has zipper compression stockings to use. We have ordered him thigh-high Velcro compression wraps that are arriving in 2 weeks. 8/12; patient presents for follow-up. Unfortunately he has reopened wounds on both lower extremities. He had to stop his diuretic due to low blood pressure And now his lower extremities are more edematous.. He has received his thigh-high Velcro compression wraps and lymphedema pumps. 8/22; patient presents for follow-up. We have been using silver alginate under 4-layer compression to the lower extremities bilaterally. His wounds are smaller. He has no issues or complaints today. Electronic Signature(s) Signed: 12/24/2022 1:35:31 PM By: Geralyn Corwin DO Entered By: Geralyn Corwin on 12/20/2022 08:23:44 -------------------------------------------------------------------------------- Physical Exam Details Patient Name: Date of Service: [name], [name] 12/20/2022 10:15 A M Medical Record Number: [id number] Patient Account Number: [id number] Date of Birth/Sex: Treating RN: 02/02/1951 (72 y.o. M) Primary Care Provider: Everrett Coombe Other Clinician: Referring Provider: Treating Provider/Extender: Cheri Guppy in Treatment: 11 Constitutional respirations regular, non-labored and within target range for patient.. Cardiovascular 2+ dorsalis pedis/posterior tibialis pulses. Psychiatric pleasant and cooperative. Notes Open wounds to lower extremities bilaterally with granulation tissue present. 2+ pitting edema to the thigh. No signs of surrounding infection. Electronic Signature(s) Signed: 12/24/2022 1:35:31 PM  By: Geralyn Corwin DO Entered By: Geralyn Corwin on 12/20/2022 08:25:24 -------------------------------------------------------------------------------- Physician Orders Details Patient Name: Date of Service: [name], [name] 12/20/2022 10:15 A M Medical Record Number: [id number] Patient Account Number: [id number] Date of Birth/Sex: Treating RN: 18-Jan-1951 (72 y.o. Tammy Sours Primary Care Provider: Everrett Coombe Other Clinician: Referring Provider: Treating Provider/Extender: Cheri Guppy in Treatment: 11 Verbal / Phone Orders: No Diagnosis Coding ICD-10 Coding Code Description I87.312 Chronic venous hypertension (idiopathic) with ulcer of left lower extremity I87.311 Chronic venous hypertension (idiopathic) with ulcer of right lower extremity S81.802A Unspecified open wound, left lower leg, initial encounter Barcomb, Diangelo ([id number]) [id number] Page 3 of 9 S81.801A Unspecified open wound, right lower leg, initial encounter I89.0 Lymphedema, not elsewhere classified 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 - Dr. Mikey Bussing Thursday 12/25/2022 2pm (already scheduled) Return A ppointment in 2 weeks. - Dr. Mikey Bussing Thursday (please schedule for patient) Return A Other: - Bring in whole juxtafits for both legs bring in weekly in case your wounds heal. You may wear the thigh juxtafit now apply in the morning and at night. Bathing/ Shower/ Hygiene May shower with protection but do not get wound dressing(s) wet. Protect dressing(s) with water repellant cover (for example, large plastic bag) or a cast cover and may then take shower. Edema Control - Lymphedema / SCD / Other Bilateral Lower Extremities Lymphedema Pumps. Use Lymphedema pumps on leg(s) 2-3 times a day for 45-60 minutes. If wearing any wraps or hose, do not remove them. Continue exercising as  instructed. Elevate legs to the level of the heart or above for 30 minutes daily and/or when sitting for 3-4 times a day throughout the day. Avoid standing for long periods of time. Exercise regularly Moisturize legs daily. Compression stocking or Garment 30-40 mm/Hg pressure to: - thigh high compression stockings- Circaid Juxtafit whole leg both legs. bring in weekly. Wound Treatment Wound #10 - Lower Leg Wound Laterality: Left,  Anterior Cleanser: Soap and Water 1 x Per Week/30 Days Discharge Instructions: May shower and wash wound with dial antibacterial soap and water prior to dressing change. Cleanser: Wound Cleanser 1 x Per Week/30 Days Discharge Instructions: Cleanse the wound with wound cleanser prior to applying a clean dressing using gauze sponges, not tissue or cotton balls. Peri-Wound Care: Sween Lotion (Moisturizing lotion) 1 x Per Week/30 Days Discharge Instructions: Apply moisturizing lotion as directed Topical: Gentamicin 1 x Per Week/30 Days Discharge Instructions: As directed by physician Topical: Mupirocin Ointment 1 x Per Week/30 Days Discharge Instructions: Apply Mupirocin (Bactroban) as instructed Prim Dressing: Maxorb Extra Ag+ Alginate Dressing, 4x4.75 (in/in) 1 x Per Week/30 Days ary Discharge Instructions: Apply to wound bed as instructed Secondary Dressing: ABD Pad, 5x9 1 x Per Week/30 Days Discharge Instructions: Apply over primary dressing as directed. Secondary Dressing: CarboFLEX Odor Control Dressing, 4x4 in 1 x Per Week/30 Days Discharge Instructions: Apply over primary dressing as directed. Compression Wrap: Urgo K2, (equivalent to a 4 layer) two layer compression system, regular 1 x Per Week/30 Days Discharge Instructions: Apply Urgo K2 as directed (alternative to 4 layer compression). Wound #11 - Lower Leg Wound Laterality: Right, Lateral Cleanser: Soap and Water 1 x Per Week/30 Days Discharge Instructions: May shower and wash wound with dial  antibacterial soap and water prior to dressing change. Cleanser: Wound Cleanser 1 x Per Week/30 Days Discharge Instructions: Cleanse the wound with wound cleanser prior to applying a clean dressing using gauze sponges, not tissue or cotton balls. Peri-Wound Care: Sween Lotion (Moisturizing lotion) 1 x Per Week/30 Days Discharge Instructions: Apply moisturizing lotion as directed Topical: Gentamicin 1 x Per Week/30 Days Discharge Instructions: As directed by physician Topical: Mupirocin Ointment 1 x Per Week/30 Days Discharge Instructions: Apply Mupirocin (Bactroban) as instructed Prim Dressing: Maxorb Extra Ag+ Alginate Dressing, 4x4.75 (in/in) 1 x Per Week/30 Days ary Discharge Instructions: Apply to wound bed as instructed Secondary Dressing: ABD Pad, 5x9 1 x Per Week/30 Days Discharge Instructions: Apply over primary dressing as directed. Secondary Dressing: CarboFLEX Odor Control Dressing, 4x4 in 1 x Per Week/30 Days Discharge Instructions: Apply over primary dressing as directed. [name], [name] ([id number]) [id number] Page 4 of 9 Compression Wrap: Urgo K2, (equivalent to a 4 layer) two layer compression system, regular 1 x Per Week/30 Days Discharge Instructions: Apply Urgo K2 as directed (alternative to 4 layer compression). Electronic Signature(s) Signed: 12/24/2022 1:35:31 PM By: Geralyn Corwin DO Entered By: Geralyn Corwin on 12/20/2022 08:26:20 -------------------------------------------------------------------------------- Problem List Details Patient Name: Date of Service: [name], [name] 12/20/2022 10:15 A M Medical Record Number: [id number] Patient Account Number: [id number] Date of Birth/Sex: Treating RN: [birth year] (72 y.o. Tammy Sours Primary Care Provider: Everrett Coombe Other Clinician: Referring Provider: Treating Provider/Extender: Cheri Guppy in Treatment: 11 Active Problems ICD-10 Encounter Code  Description Active Date MDM Diagnosis I87.312 Chronic venous hypertension (idiopathic) with ulcer of left lower extremity 11/05/2022 No Yes I87.311 Chronic venous hypertension (idiopathic) with ulcer of right lower extremity 11/19/2022 No Yes S81.802A Unspecified open wound, left lower leg, initial encounter 11/05/2022 No Yes S81.801A Unspecified open wound, right lower leg, initial encounter 12/10/2022 No Yes I89.0 Lymphedema, not elsewhere classified 10/04/2022 No Yes 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, not elsewhere classified 10/04/2022 No Yes Inactive Problems Resolved Problems ICD-10 Code Description Active Date Resolved Date L89.312 Pressure ulcer of right buttock, stage 2 10/04/2022 10/04/2022 L89.322 Pressure ulcer of left buttock, stage 2 10/04/2022 10/04/2022 L97.818 Non-pressure chronic ulcer of other  part of right lower leg with other specified severity 10/04/2022 10/04/2022 B37.0 Candidal stomatitis 10/04/2022 10/04/2022 [name], [name] ([id number]) [id number] Page 5 of 9 Electronic Signature(s) Signed: 12/24/2022 1:35:31 PM By: Geralyn Corwin DO Entered By: Geralyn Corwin on 12/20/2022 08:22:09 -------------------------------------------------------------------------------- Progress Note Details Patient Name: Date of Service: [name], [name] 12/20/2022 10:15 A M Medical Record Number: [id number] Patient Account Number: [id number] Date of Birth/Sex: Treating RN: Jan 22, 1951 (72 y.o. M) Primary Care Provider: Everrett Coombe Other Clinician: Referring Provider: Treating Provider/Extender: Cheri Guppy in Treatment: 11 Subjective Chief Complaint Information obtained from Patient 10/04/2022; patient is here for review of multiple wounds History of Present Illness (HPI) ADMISSION 10/04/2022 This is a 72 year old man who is quite disabled because of some form of myopathy. He follows for this with neurology. He has had biopsies that  showed necrosis without inflammation. 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 In any case he is reasonably immobile at home. He has this apparatus to help him transfer. Because of difficulties getting him upstairs he has been sleeping in a recliner on the main floor. He also has a history of lymphedema and I see he was followed by PT for a period of time. He has a history of combined congestive heart failure but his last echo showed an EF of 60 to 65%. He has developed a wound on the right lateral leg over the last month or so. He also has a smaller wound on the right anterior lower leg. He is having problems with his left second toe and he has bilateral stage II ulcers over his ischial tuberosity. The patient is complaining of oral pain under his tongue. He has bilateral heel pain and increasing edema with weight gain over the past week or 2 Past medical history includes myopathy/myositis question inclusion body myositis, rheumatoid arthritis, coronary artery disease, massive pulmonary embolism, type 2 diabetes, lymphedema, combined congestive heart failure, bilateral foot drop PTSD ABIs in our clinic were 1.22 on the right 1.27 on the left 6/17; patient presents for follow-up. He has been using silver alginate to the bilateral buttocks wounds. These have healed. We have also been using silver alginate to the right lower extremity under compression therapy. He is tolerated this well. The wounds are smaller. 6/25; patient presents for follow-up. We have been using silver alginate to the right lower extremity under compression therapy. The anterior leg wound has healed. The lateral right leg wound is smaller. 7/8; patient presents for follow-up. We have been using silver alginate under compression therapy to the right lower extremity wound and this is healed. Unfortunately he has developed a blister  to the left lower extremity. 7/15; patient presents for follow-up. We have been using silver alginate under 4-layer compression to the left lower extremity. His wound has healed here. Unfortunately he has developed blistering to the right lower extremity and has open wounds despite using compression therapy daily here. 7/22; patient presents for follow-up. We have been using silver alginate under 4-layer compression to the right lower extremity. He has been using his zipper compression wrap to the left lower extremity daily. 2 wounds have healed on the right lower extremity. He has 1 remaining wound to the anterior aspect. He has no issues or complaints today. 8/2; patient presents for follow-up. We have been using silver alginate under Urgo K2 to the right lower extremity. His wounds have healed. He has  zipper compression stockings to use. We have ordered him thigh-high Velcro compression wraps that are arriving in 2 weeks. 8/12; patient presents for follow-up. Unfortunately he has reopened wounds on both lower extremities. He had to stop his diuretic due to low blood pressure And now his lower extremities are more edematous.. He has received his thigh-high Velcro compression wraps and lymphedema pumps. 8/22; patient presents for follow-up. We have been using silver alginate under 4-layer compression to the lower extremities bilaterally. His wounds are smaller. He has no issues or complaints today. Patient History Information obtained from Patient, Chart. Social History Never smoker, Marital Status - Married, Alcohol Use - Never, Drug Use - No History, Caffeine Use - Daily - coffee. Medical History Hematologic/Lymphatic Patient has history of Anemia, Lymphedema - Bilateral Lower legs [name], [name] ([id number]) [id number] Page 6 of 9 Cardiovascular Patient has history of Congestive Heart Failure, Deep Vein Thrombosis Endocrine Patient has history of Type II  Diabetes Musculoskeletal Patient has history of Rheumatoid Arthritis Hospitalization/Surgery History - Biopsy left Shoulder; Angiogram Pulmonary Bilateral. Medical A Surgical History Notes nd Respiratory Hx: Pulmonary Embolism Gastrointestinal GERD Endocrine Hx: Hypothyroidism Genitourinary IR:[id number] Immunological XF:GHWEXHBZJI disease Musculoskeletal Hx: Necrotizing Myopathy Psychiatric PTSD Objective Constitutional respirations regular, non-labored and within target range for patient.. Vitals Time Taken: 10:42 AM, Height: 68 in, Weight: 220 lbs, BMI: 33.4, Temperature: 98.3 F, Pulse: 80 bpm, Respiratory Rate: 18 breaths/min, Blood Pressure: 132/82 mmHg. Cardiovascular 2+ dorsalis pedis/posterior tibialis pulses. Psychiatric pleasant and cooperative. General Notes: Open wounds to lower extremities bilaterally with granulation tissue present. 2+ pitting edema to the thigh. No signs of surrounding infection. Integumentary (Hair, Skin) Wound #10 status is Open. Original cause of wound was Blister. The date acquired was: 12/10/2022. The wound has been in treatment 1 weeks. The wound is located on the Left,Anterior Lower Leg. The wound measures 2cm length x 1.5cm width x 0.1cm depth; 2.356cm^2 area and 0.236cm^3 volume. There is Fat Layer (Subcutaneous Tissue) exposed. There is no tunneling or undermining noted. There is a medium amount of serosanguineous drainage noted. The wound margin is distinct with the outline attached to the wound base. There is large (67-100%) granulation within the wound bed. There is no necrotic tissue within the wound bed. The periwound skin appearance did not exhibit: Callus, Crepitus, Excoriation, Induration, Rash, Scarring, Dry/Scaly, Maceration, Atrophie Blanche, Cyanosis, Ecchymosis, Hemosiderin Staining, Mottled, Pallor, Rubor, Erythema. Wound #11 status is Open. Original cause of wound was Blister. The date acquired was: 12/10/2022. The wound  has been in treatment 1 weeks. The wound is located on the Right,Lateral Lower Leg. The wound measures 0.8cm length x 0.7cm width x 0.1cm depth; 0.44cm^2 area and 0.044cm^3 volume. There is Fat Layer (Subcutaneous Tissue) exposed. There is no tunneling or undermining noted. There is a medium amount of serosanguineous drainage noted. The wound margin is distinct with the outline attached to the wound base. There is large (67-100%) red granulation within the wound bed. There is no necrotic tissue within the wound bed. The periwound skin appearance did not exhibit: Callus, Crepitus, Excoriation, Induration, Rash, Scarring, Dry/Scaly, Maceration, Atrophie Blanche, Cyanosis, Ecchymosis, Hemosiderin Staining, Mottled, Pallor, Rubor, Erythema. Periwound temperature was noted as No Abnormality. Assessment Active Problems ICD-10 Chronic venous hypertension (idiopathic) with ulcer of left lower extremity Chronic venous hypertension (idiopathic) with ulcer of right lower extremity Unspecified open wound, left lower leg, initial encounter Unspecified open wound, right lower leg, initial encounter Lymphedema, not elsewhere classified Other inflammatory and immune myopathies, not elsewhere classified Patient's wounds appear  well-healing. I recommended continuing the course with silver alginate but will add antibiotic ointment under the 4-layer compression. Follow-up in 1 week. [name], [name] ([id number]) [id number] Page 7 of 9 Procedures Wound #10 Pre-procedure diagnosis of Wound #10 is a Lymphedema located on the Left,Anterior Lower Leg . There was a Double Layer Compression Therapy Procedure by Shawn Stall, RN. Post procedure Diagnosis Wound #10: Same as Pre-Procedure Wound #11 Pre-procedure diagnosis of Wound #11 is a Lymphedema located on the Right,Lateral Lower Leg . There was a Double Layer Compression Therapy Procedure by Shawn Stall, RN. Post procedure Diagnosis  Wound #11: Same as Pre-Procedure Plan Follow-up Appointments: Return Appointment in 1 week. - Dr. Mikey Bussing Thursday 12/25/2022 2pm (already scheduled) Return Appointment in 2 weeks. - Dr. Mikey Bussing Thursday (please schedule for patient) Other: - Bring in whole juxtafits for both legs bring in weekly in case your wounds heal. You may wear the thigh juxtafit now apply in the morning and at night. Bathing/ Shower/ Hygiene: May shower with protection but do not get wound dressing(s) wet. Protect dressing(s) with water repellant cover (for example, large plastic bag) or a cast cover and may then take shower. Edema Control - Lymphedema / SCD / Other: Lymphedema Pumps. Use Lymphedema pumps on leg(s) 2-3 times a day for 45-60 minutes. If wearing any wraps or hose, do not remove them. Continue exercising as instructed. Elevate legs to the level of the heart or above for 30 minutes daily and/or when sitting for 3-4 times a day throughout the day. Avoid standing for long periods of time. Exercise regularly Moisturize legs daily. Compression stocking or Garment 30-40 mm/Hg pressure to: - thigh high compression stockings- Circaid Juxtafit whole leg both legs. bring in weekly. WOUND #10: - Lower Leg Wound Laterality: Left, Anterior Cleanser: Soap and Water 1 x Per Week/30 Days Discharge Instructions: May shower and wash wound with dial antibacterial soap and water prior to dressing change. Cleanser: Wound Cleanser 1 x Per Week/30 Days Discharge Instructions: Cleanse the wound with wound cleanser prior to applying a clean dressing using gauze sponges, not tissue or cotton balls. Peri-Wound Care: Sween Lotion (Moisturizing lotion) 1 x Per Week/30 Days Discharge Instructions: Apply moisturizing lotion as directed Topical: Gentamicin 1 x Per Week/30 Days Discharge Instructions: As directed by physician Topical: Mupirocin Ointment 1 x Per Week/30 Days Discharge Instructions: Apply Mupirocin (Bactroban) as  instructed Prim Dressing: Maxorb Extra Ag+ Alginate Dressing, 4x4.75 (in/in) 1 x Per Week/30 Days ary Discharge Instructions: Apply to wound bed as instructed Secondary Dressing: ABD Pad, 5x9 1 x Per Week/30 Days Discharge Instructions: Apply over primary dressing as directed. Secondary Dressing: CarboFLEX Odor Control Dressing, 4x4 in 1 x Per Week/30 Days Discharge Instructions: Apply over primary dressing as directed. Com pression Wrap: Urgo K2, (equivalent to a 4 layer) two layer compression system, regular 1 x Per Week/30 Days Discharge Instructions: Apply Urgo K2 as directed (alternative to 4 layer compression). WOUND #11: - Lower Leg Wound Laterality: Right, Lateral Cleanser: Soap and Water 1 x Per Week/30 Days Discharge Instructions: May shower and wash wound with dial antibacterial soap and water prior to dressing change. Cleanser: Wound Cleanser 1 x Per Week/30 Days Discharge Instructions: Cleanse the wound with wound cleanser prior to applying a clean dressing using gauze sponges, not tissue or cotton balls. Peri-Wound Care: Sween Lotion (Moisturizing lotion) 1 x Per Week/30 Days Discharge Instructions: Apply moisturizing lotion as directed Topical: Gentamicin 1 x Per Week/30 Days Discharge Instructions: As directed by physician Topical: Mupirocin Ointment  1 x Per Week/30 Days Discharge Instructions: Apply Mupirocin (Bactroban) as instructed Prim Dressing: Maxorb Extra Ag+ Alginate Dressing, 4x4.75 (in/in) 1 x Per Week/30 Days ary Discharge Instructions: Apply to wound bed as instructed Secondary Dressing: ABD Pad, 5x9 1 x Per Week/30 Days Discharge Instructions: Apply over primary dressing as directed. Secondary Dressing: CarboFLEX Odor Control Dressing, 4x4 in 1 x Per Week/30 Days Discharge Instructions: Apply over primary dressing as directed. Com pression Wrap: Urgo K2, (equivalent to a 4 layer) two layer compression system, regular 1 x Per Week/30 Days Discharge  Instructions: Apply Urgo K2 as directed (alternative to 4 layer compression). 1. Silver alginate with antibiotic ointment under 4-layer compression to lower extremities bilaterally 2. Follow-up in 1 week Electronic Signature(s) Signed: 12/24/2022 1:35:31 PM By: Geralyn Corwin DO Entered By: Geralyn Corwin on 12/20/2022 08:31:26 [name], [name] ([id number]) [id number] Page 8 of 9 -------------------------------------------------------------------------------- HxROS Details Patient Name: Date of Service: [name], [name] 12/20/2022 10:15 A M Medical Record Number: [id number] Patient Account Number: [id number] Date of Birth/Sex: Treating RN: 04-25-1951 (72 y.o. M) Primary Care Provider: Everrett Coombe Other Clinician: Referring Provider: Treating Provider/Extender: Cheri Guppy in Treatment: 11 Information Obtained From Patient Chart Hematologic/Lymphatic Medical History: Positive for: Anemia; Lymphedema - Bilateral Lower legs Respiratory Medical History: Past Medical History Notes: Hx: Pulmonary Embolism Cardiovascular Medical History: Positive for: Congestive Heart Failure; Deep Vein Thrombosis Gastrointestinal Medical History: Past Medical History Notes: GERD Endocrine Medical History: Positive for: Type II Diabetes Past Medical History Notes: Hx: Hypothyroidism Time with diabetes: 33 years Treated with: Insulin Genitourinary Medical History: Past Medical History Notes: ZH:YQMVHQIONGEX Immunological Medical History: Past Medical History Notes: BM:WUXLKGMWNU disease Musculoskeletal Medical History: Positive for: Rheumatoid Arthritis Past Medical History Notes: Hx: Necrotizing Myopathy Psychiatric Medical History: Past Medical History Notes: PTSD Immunizations Pneumococcal VaccineWESTON, WHITTAKER ([id number]) [id number] Page 9 of 9 Received Pneumococcal Vaccination: Yes Received  Pneumococcal Vaccination On or After 60th Birthday: No Implantable Devices None Hospitalization / Surgery History Type of Hospitalization/Surgery Biopsy left Shoulder; Angiogram Pulmonary Bilateral Family and Social History Never smoker; Marital Status - Married; Alcohol Use: Never; Drug Use: No History; Caffeine Use: Daily - coffee; Financial Concerns: No; Food, Clothing or Shelter Needs: No; Support System Lacking: No; Transportation Concerns: No Electronic Signature(s) Signed: 12/24/2022 1:35:31 PM By: Geralyn Corwin DO Entered By: Geralyn Corwin on 12/20/2022 08:24:24 -------------------------------------------------------------------------------- SuperBill Details Patient Name: Date of Service: [name], [name] 12/20/2022 Medical Record Number: [id number] Patient Account Number: [id number] Date of Birth/Sex: Treating RN: 04/24/1951 (72 y.o. [name], Yvonne Kendall Primary Care Provider: Everrett Coombe Other Clinician: Referring Provider: Treating Provider/Extender: Cheri Guppy in Treatment: 11 Diagnosis Coding ICD-10 Codes Code Description [phone] Chronic venous hypertension (idiopathic) with ulcer of left lower extremity I87.311 Chronic venous hypertension (idiopathic) with ulcer of right lower extremity S81.802A Unspecified open wound, left lower leg, initial encounter S81.801A Unspecified open wound, right lower leg, initial encounter I89.0 Lymphedema, not elsewhere classified 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, not elsewhere classified Facility Procedures : CPT4: Code [id number] Description: 81 BILATERAL: Application of multi-layer venous compression system; leg (below knee), including ankle and t. Modifier: Quantity: 1 Physician Procedures : CPT4 Code Description Modifier [id number] - WC PHYS LEVEL 3 - EST PT ICD-10 Diagnosis Description I87.312 Chronic venous hypertension (idiopathic) with ulcer of left lower extremity I87.311  Chronic venous hypertension (idiopathic) with ulcer of  right lower extremity S81.802A Unspecified open wound, left lower leg, initial encounter S81.801A Unspecified open wound, right lower leg,  initial encounter Quantity: 1 Electronic Signature(s) Signed: 12/24/2022 1:35:31 PM By: Geralyn Corwin DO Entered By: Geralyn Corwin on 12/20/2022 08:31:48

## 2022-12-25 ENCOUNTER — Encounter (HOSPITAL_BASED_OUTPATIENT_CLINIC_OR_DEPARTMENT_OTHER): Payer: Medicare Other | Admitting: Internal Medicine

## 2022-12-25 DIAGNOSIS — S81802A Unspecified open wound, left lower leg, initial encounter: Secondary | ICD-10-CM

## 2022-12-25 DIAGNOSIS — S81801A Unspecified open wound, right lower leg, initial encounter: Secondary | ICD-10-CM

## 2022-12-25 DIAGNOSIS — L97818 Non-pressure chronic ulcer of other part of right lower leg with other specified severity: Secondary | ICD-10-CM | POA: Diagnosis not present

## 2022-12-25 DIAGNOSIS — I87312 Chronic venous hypertension (idiopathic) with ulcer of left lower extremity: Secondary | ICD-10-CM | POA: Diagnosis not present

## 2022-12-25 DIAGNOSIS — G7249 Other inflammatory and immune myopathies, not elsewhere classified: Secondary | ICD-10-CM | POA: Diagnosis not present

## 2022-12-25 DIAGNOSIS — I890 Lymphedema, not elsewhere classified: Secondary | ICD-10-CM | POA: Diagnosis not present

## 2022-12-25 DIAGNOSIS — I87311 Chronic venous hypertension (idiopathic) with ulcer of right lower extremity: Secondary | ICD-10-CM

## 2022-12-25 NOTE — Progress Notes (Signed)
[name], [name] ([id number]) [id number] Page 1 of 9 Visit Report for 12/25/2022 Chief Complaint Document Details Patient Name: Date of Service: [name], [name] 12/25/2022 2:00 PM Medical Record Number: [id number] Patient Account Number: [id number] Date of Birth/Sex: Treating RN: 1950-08-19 (72 y.o. M) Primary Care Provider: Everrett Coombe Other Clinician: Referring Provider: Treating Provider/Extender: Cheri Guppy in Treatment: 11 Information Obtained from: Patient Chief Complaint 10/04/2022; patient is here for review of multiple wounds Electronic Signature(s) Signed: 12/25/2022 4:43:58 PM By: Geralyn Corwin DO Entered By: Geralyn Corwin on 12/25/2022 15:21:18 -------------------------------------------------------------------------------- HPI Details Patient Name: Date of Service: [name] 12/25/2022 2:00 PM Medical Record Number: [id number] Patient Account Number: [id number] Date of Birth/Sex: Treating RN: 29-Oct-1950 (72 y.o. M) Primary Care Provider: Everrett Coombe Other Clinician: Referring Provider: Treating Provider/Extender: Cheri Guppy in Treatment: 11 History of Present Illness HPI Description: ADMISSION 10/04/2022 This is a 72 year old man who is quite disabled because of some form of myopathy. He follows for this with neurology. He has had biopsies that showed necrosis without inflammation. 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 In any case he is reasonably immobile at home. He has this apparatus to help him transfer. Because of difficulties getting him upstairs he has been sleeping in a recliner on the main floor. He also has a history of lymphedema and I see he was followed by PT for a period of time. He has a history of combined congestive heart failure but his last echo showed an EF of 60  to 65%. He has developed a wound on the right lateral leg over the last month or so. He also has a smaller wound on the right anterior lower leg. He is having problems with his left second toe and he has bilateral stage II ulcers over his ischial tuberosity. The patient is complaining of oral pain under his tongue. He has bilateral heel pain and increasing edema with weight gain over the past week or 2 Past medical history includes myopathy/myositis question inclusion body myositis, rheumatoid arthritis, coronary artery disease, massive pulmonary embolism, type 2 diabetes, lymphedema, combined congestive heart failure, bilateral foot drop PTSD ABIs in our clinic were 1.22 on the right 1.27 on the left 6/17; patient presents for follow-up. He has been using silver alginate to the bilateral buttocks wounds. These have healed. We have also been using silver alginate to the right lower extremity under compression therapy. He is tolerated this well. The wounds are smaller. 6/25; patient presents for follow-up. We have been using silver alginate to the right lower extremity under compression therapy. The anterior leg wound has healed. The lateral right leg wound is smaller. 7/8; patient presents for follow-up. We have been using silver alginate under compression therapy to the right lower extremity wound and this is healed. Unfortunately he has developed a blister to the left lower extremity. 7/15; patient presents for follow-up. We have been using silver alginate under 4-layer compression to the left lower extremity. His wound has healed here. Unfortunately he has developed blistering to the right lower extremity and has open wounds despite using compression therapy daily here. [name], [name] ([id number]) [id number] Page 2 of 9 7/22; patient presents for follow-up. We have been using silver alginate under 4-layer compression to the right lower extremity. He has been using his  zipper compression wrap to the left lower extremity daily. 2 wounds have healed on  the right lower extremity. He has 1 remaining wound to the anterior aspect. He has no issues or complaints today. 8/2; patient presents for follow-up. We have been using silver alginate under Urgo K2 to the right lower extremity. His wounds have healed. He has zipper compression stockings to use. We have ordered him thigh-high Velcro compression wraps that are arriving in 2 weeks. 8/12; patient presents for follow-up. Unfortunately he has reopened wounds on both lower extremities. He had to stop his diuretic due to low blood pressure And now his lower extremities are more edematous.. He has received his thigh-high Velcro compression wraps and lymphedema pumps. 8/22; patient presents for follow-up. We have been using silver alginate under 4-layer compression to the lower extremities bilaterally. His wounds are smaller. He has no issues or complaints today. 8/27; patient presents for follow-up. We have been using silver alginate with antibiotic ointment under 4-layer equivalent compression wraps to the lower extremities bilaterally. The right lower extremity wound has healed. He still has a small open wound to the left lower extremity. He has his juxta fits with him today. This will be applied to the right leg. Electronic Signature(s) Signed: 12/25/2022 4:43:58 PM By: Geralyn Corwin DO Entered By: Geralyn Corwin on 12/25/2022 15:22:14 -------------------------------------------------------------------------------- Physical Exam Details Patient Name: Date of Service: [name], [name] 12/25/2022 2:00 PM Medical Record Number: [id number] Patient Account Number: [id number] Date of Birth/Sex: Treating RN: May 23, 1950 (72 y.o. M) Primary Care Provider: Everrett Coombe Other Clinician: Referring Provider: Treating Provider/Extender: Cheri Guppy in Treatment: 11 Constitutional respirations  regular, non-labored and within target range for patient.. Cardiovascular 2+ dorsalis pedis/posterior tibialis pulses. Psychiatric pleasant and cooperative. Notes Open wound with granulation tissue to the left lower extremity. 2+ pitting edema to the thighs bilaterally. No signs of infection to either leg. Electronic Signature(s) Signed: 12/25/2022 4:43:58 PM By: Geralyn Corwin DO Entered By: Geralyn Corwin on 12/25/2022 15:23:02 -------------------------------------------------------------------------------- Physician Orders Details Patient Name: Date of Service: [name], [name] 12/25/2022 2:00 PM Medical Record Number: [id number] Patient Account Number: [id number] Date of Birth/Sex: Treating RN: [birth year] (72 y.o. Cline Cools Primary Care Provider: Everrett Coombe Other Clinician: Referring Provider: Treating Provider/Extender: Cheri Guppy in Treatment: [phone] Verbal / Phone Orders: No Diagnosis Coding Follow-up Appointments ppointment in 1 week. - Dr. Mikey Bussing Thursday 01/03/2023 2:15pm (already scheduled) Return A Laing, Keiyon ([id number]) [id number] Page 3 of 9 ppointment in 2 weeks. - Dr. Mikey Bussing Thursday (please schedule for patient) Return A Other: - Bring in whole juxtafits for both legs bring in weekly in case your wounds heal. You may wear the thigh juxtafit now apply in the morning and at night. Bathing/ Shower/ Hygiene May shower with protection but do not get wound dressing(s) wet. Protect dressing(s) with water repellant cover (for example, large plastic bag) or a cast cover and may then take shower. Edema Control - Lymphedema / SCD / Other Bilateral Lower Extremities Lymphedema Pumps. Use Lymphedema pumps on leg(s) 2-3 times a day for 45-60 minutes. If wearing any wraps or hose, do not remove them. Continue exercising as instructed. Elevate legs to the level of the heart or above for 30 minutes daily and/or when  sitting for 3-4 times a day throughout the day. Avoid standing for long periods of time. Exercise regularly Moisturize legs daily. Compression stocking or Garment 30-40 mm/Hg pressure to: - thigh high compression stockings- Circaid Juxtafit whole leg both legs. bring in weekly. Wound Treatment Wound #10 - Lower Leg Wound Laterality: Left,  Anterior Cleanser: Soap and Water 1 x Per Week/30 Days Discharge Instructions: May shower and wash wound with dial antibacterial soap and water prior to dressing change. Cleanser: Wound Cleanser 1 x Per Week/30 Days Discharge Instructions: Cleanse the wound with wound cleanser prior to applying a clean dressing using gauze sponges, not tissue or cotton balls. Peri-Wound Care: Sween Lotion (Moisturizing lotion) 1 x Per Week/30 Days Discharge Instructions: Apply moisturizing lotion as directed Topical: Gentamicin 1 x Per Week/30 Days Discharge Instructions: As directed by physician Topical: Mupirocin Ointment 1 x Per Week/30 Days Discharge Instructions: Apply Mupirocin (Bactroban) as instructed Prim Dressing: Maxorb Extra Ag+ Alginate Dressing, 4x4.75 (in/in) 1 x Per Week/30 Days ary Discharge Instructions: Apply to wound bed as instructed Secondary Dressing: ABD Pad, 5x9 1 x Per Week/30 Days Discharge Instructions: Apply over primary dressing as directed. Secondary Dressing: CarboFLEX Odor Control Dressing, 4x4 in 1 x Per Week/30 Days Discharge Instructions: Apply over primary dressing as directed. Compression Wrap: Urgo K2, (equivalent to a 4 layer) two layer compression system, regular 1 x Per Week/30 Days Discharge Instructions: Apply Urgo K2 as directed (alternative to 4 layer compression). Electronic Signature(s) Signed: 12/25/2022 4:43:58 PM By: Geralyn Corwin DO Entered By: Geralyn Corwin on 12/25/2022 15:23:16 -------------------------------------------------------------------------------- Problem List Details Patient Name: Date of  Service: KEIFFER, SHORTALL 12/25/2022 2:00 PM Medical Record Number: [id number] Patient Account Number: [id number] Date of Birth/Sex: Treating RN: Apr 28, 1951 (72 y.o. M) Primary Care Provider: Everrett Coombe Other Clinician: Referring Provider: Treating Provider/Extender: Cheri Guppy in Treatment: 11 Active Problems ICD-10 Encounter Code Description Active Date MDM Diagnosis I87.312 Chronic venous hypertension (idiopathic) with ulcer of left lower extremity 11/05/2022 No Yes [name], [name] ([id number]) [id number] Page 4 of 9 I87.311 Chronic venous hypertension (idiopathic) with ulcer of right lower extremity 11/19/2022 No Yes S81.802A Unspecified open wound, left lower leg, initial encounter 11/05/2022 No Yes S81.801A Unspecified open wound, right lower leg, initial encounter 12/10/2022 No Yes I89.0 Lymphedema, not elsewhere classified 10/04/2022 No Yes 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, not elsewhere classified 10/04/2022 No Yes Inactive Problems Resolved Problems ICD-10 Code Description Active Date Resolved Date L89.312 Pressure ulcer of right buttock, stage 2 10/04/2022 10/04/2022 L89.322 Pressure ulcer of left buttock, stage 2 10/04/2022 10/04/2022 L97.818 Non-pressure chronic ulcer of other part of right lower leg with other specified severity 10/04/2022 10/04/2022 B37.0 Candidal stomatitis 10/04/2022 10/04/2022 Electronic Signature(s) Signed: 12/25/2022 4:43:58 PM By: Geralyn Corwin DO Entered By: Geralyn Corwin on 12/25/2022 15:20:42 -------------------------------------------------------------------------------- Progress Note Details Patient Name: Date of Service: [name] 12/25/2022 2:00 PM Medical Record Number: [id number] Patient Account Number: [id number] Date of Birth/Sex: Treating RN: 09/02/1950 (72 y.o. M) Primary Care Provider: Everrett Coombe Other Clinician: Referring Provider: Treating Provider/Extender: Cheri Guppy in Treatment: 11 Subjective Chief Complaint Information obtained from Patient 10/04/2022; patient is here for review of multiple wounds History of Present Illness (HPI) ADMISSION 10/04/2022 This is a 72 year old man who is quite disabled because of some form of myopathy. He follows for this with neurology. He has had biopsies that showed necrosis without inflammation. At various times this is being labeled as polymyositis, inclusion body myositis although I do not know that an exact label has [name], Timber ([id number]) [phone].pdf Page 5 of 9 been determined. In any case he is reasonably immobile at home. He has this apparatus to help him transfer. Because of difficulties getting him upstairs he has been sleeping in a recliner on the main floor. He also has a  history of lymphedema and I see he was followed by PT for a period of time. He has a history of combined congestive heart failure but his last echo showed an EF of 60 to 65%. He has developed a wound on the right lateral leg over the last month or so. He also has a smaller wound on the right anterior lower leg. He is having problems with his left second toe and he has bilateral stage II ulcers over his ischial tuberosity. The patient is complaining of oral pain under his tongue. He has bilateral heel pain and increasing edema with weight gain over the past week or 2 Past medical history includes myopathy/myositis question inclusion body myositis, rheumatoid arthritis, coronary artery disease, massive pulmonary embolism, type 2 diabetes, lymphedema, combined congestive heart failure, bilateral foot drop PTSD ABIs in our clinic were 1.22 on the right 1.27 on the left 6/17; patient presents for follow-up. He has been using silver alginate to the bilateral buttocks wounds. These have healed. We have also been using silver alginate to the right lower extremity under compression therapy. He is  tolerated this well. The wounds are smaller. 6/25; patient presents for follow-up. We have been using silver alginate to the right lower extremity under compression therapy. The anterior leg wound has healed. The lateral right leg wound is smaller. 7/8; patient presents for follow-up. We have been using silver alginate under compression therapy to the right lower extremity wound and this is healed. Unfortunately he has developed a blister to the left lower extremity. 7/15; patient presents for follow-up. We have been using silver alginate under 4-layer compression to the left lower extremity. His wound has healed here. Unfortunately he has developed blistering to the right lower extremity and has open wounds despite using compression therapy daily here. 7/22; patient presents for follow-up. We have been using silver alginate under 4-layer compression to the right lower extremity. He has been using his zipper compression wrap to the left lower extremity daily. 2 wounds have healed on the right lower extremity. He has 1 remaining wound to the anterior aspect. He has no issues or complaints today. 8/2; patient presents for follow-up. We have been using silver alginate under Urgo K2 to the right lower extremity. His wounds have healed. He has zipper compression stockings to use. We have ordered him thigh-high Velcro compression wraps that are arriving in 2 weeks. 8/12; patient presents for follow-up. Unfortunately he has reopened wounds on both lower extremities. He had to stop his diuretic due to low blood pressure And now his lower extremities are more edematous.. He has received his thigh-high Velcro compression wraps and lymphedema pumps. 8/22; patient presents for follow-up. We have been using silver alginate under 4-layer compression to the lower extremities bilaterally. His wounds are smaller. He has no issues or complaints today. 8/27; patient presents for follow-up. We have been using silver  alginate with antibiotic ointment under 4-layer equivalent compression wraps to the lower extremities bilaterally. The right lower extremity wound has healed. He still has a small open wound to the left lower extremity. He has his juxta fits with him today. This will be applied to the right leg. Patient History Information obtained from Patient, Chart. Social History Never smoker, Marital Status - Married, Alcohol Use - Never, Drug Use - No History, Caffeine Use - Daily - coffee. Medical History Hematologic/Lymphatic Patient has history of Anemia, Lymphedema - Bilateral Lower legs Cardiovascular Patient has history of Congestive Heart Failure, Deep Vein Thrombosis Endocrine Patient  has history of Type II Diabetes Musculoskeletal Patient has history of Rheumatoid Arthritis Hospitalization/Surgery History - Biopsy left Shoulder; Angiogram Pulmonary Bilateral. Medical A Surgical History Notes nd Respiratory Hx: Pulmonary Embolism Gastrointestinal GERD Endocrine Hx: Hypothyroidism Genitourinary ZO:XWRUEAVWUJWJ Immunological XB:JYNWGNFAOZ disease Musculoskeletal Hx: Necrotizing Myopathy Psychiatric PTSD Objective Constitutional Olexa, [name] ([id number]) [id number] Page 6 of 9 respirations regular, non-labored and within target range for patient.. Vitals Time Taken: 2:14 PM, Height: 68 in, Weight: 220 lbs, BMI: 33.4, Temperature: 98.6 F, Pulse: 96 bpm, Respiratory Rate: 18 breaths/min, Blood Pressure: 182/70 mmHg. Cardiovascular 2+ dorsalis pedis/posterior tibialis pulses. Psychiatric pleasant and cooperative. General Notes: Open wound with granulation tissue to the left lower extremity. 2+ pitting edema to the thighs bilaterally. No signs of infection to either leg. Integumentary (Hair, Skin) Wound #10 status is Open. Original cause of wound was Blister. The date acquired was: 12/10/2022. The wound has been in treatment 2 weeks. The wound is located  on the Left,Anterior Lower Leg. The wound measures 0.3cm length x 0.3cm width x 0.1cm depth; 0.071cm^2 area and 0.007cm^3 volume. There is Fat Layer (Subcutaneous Tissue) exposed. There is no tunneling or undermining noted. There is a medium amount of serosanguineous drainage noted. The wound margin is distinct with the outline attached to the wound base. There is large (67-100%) granulation within the wound bed. There is no necrotic tissue within the wound bed. The periwound skin appearance did not exhibit: Callus, Crepitus, Excoriation, Induration, Rash, Scarring, Dry/Scaly, Maceration, Atrophie Blanche, Cyanosis, Ecchymosis, Hemosiderin Staining, Mottled, Pallor, Rubor, Erythema. Wound #11 status is Open. Original cause of wound was Blister. The date acquired was: 12/10/2022. The wound has been in treatment 2 weeks. The wound is located on the Right,Lateral Lower Leg. The wound measures 0cm length x 0cm width x 0cm depth; 0cm^2 area and 0cm^3 volume. There is Fat Layer (Subcutaneous Tissue) exposed. There is no tunneling or undermining noted. There is a medium amount of serosanguineous drainage noted. The wound margin is distinct with the outline attached to the wound base. There is large (67-100%) red granulation within the wound bed. There is no necrotic tissue within the wound bed. The periwound skin appearance did not exhibit: Callus, Crepitus, Excoriation, Induration, Rash, Scarring, Dry/Scaly, Maceration, Atrophie Blanche, Cyanosis, Ecchymosis, Hemosiderin Staining, Mottled, Pallor, Rubor, Erythema. Periwound temperature was noted as No Abnormality. Assessment Active Problems ICD-10 Chronic venous hypertension (idiopathic) with ulcer of left lower extremity Chronic venous hypertension (idiopathic) with ulcer of right lower extremity Unspecified open wound, left lower leg, initial encounter Unspecified open wound, right lower leg, initial encounter Lymphedema, not elsewhere classified Other  inflammatory and immune myopathies, not elsewhere classified Patient's right lower extremity wound has healed. I recommended juxta fit here. He still has a small remaining wound that appears well-healing to the left lower extremity. I recommended continuing the course with silver alginate antibiotic ointment under compression therapy. Follow-up in 1 week. Procedures Wound #10 Pre-procedure diagnosis of Wound #10 is a Lymphedema located on the Left,Anterior Lower Leg . There was a Four Layer Compression Therapy Procedure by Redmond Pulling, RN. Post procedure Diagnosis Wound #10: Same as Pre-Procedure Plan Follow-up Appointments: Return Appointment in 1 week. - Dr. Mikey Bussing Thursday 01/03/2023 2:15pm (already scheduled) Return Appointment in 2 weeks. - Dr. Mikey Bussing Thursday (please schedule for patient) Other: - Bring in whole juxtafits for both legs bring in weekly in case your wounds heal. You may wear the thigh juxtafit now apply in the morning and at night. Bathing/ Shower/ Hygiene: May shower with protection  but do not get wound dressing(s) wet. Protect dressing(s) with water repellant cover (for example, large plastic bag) or a cast cover and may then take shower. Edema Control - Lymphedema / SCD / Other: Lymphedema Pumps. Use Lymphedema pumps on leg(s) 2-3 times a day for 45-60 minutes. If wearing any wraps or hose, do not remove them. Continue exercising as instructed. Elevate legs to the level of the heart or above for 30 minutes daily and/or when sitting for 3-4 times a day throughout the day. Avoid standing for long periods of time. Exercise regularly Moisturize legs daily. Compression stocking or Garment 30-40 mm/Hg pressure to: - thigh high compression stockings- Circaid Juxtafit whole leg both legs. bring in weekly. WOUND #10: - Lower Leg Wound Laterality: Left, Anterior Cleanser: Soap and Water 1 x Per Week/30 Days Discharge Instructions: May shower and wash wound with dial  antibacterial soap and water prior to dressing change. Cleanser: Wound Cleanser 1 x Per Week/30 Days Discharge Instructions: Cleanse the wound with wound cleanser prior to applying a clean dressing using gauze sponges, not tissue or cotton balls. [name], [name] ([id number]) [id number] Page 7 of 9 Peri-Wound Care: Sween Lotion (Moisturizing lotion) 1 x Per Week/30 Days Discharge Instructions: Apply moisturizing lotion as directed Topical: Gentamicin 1 x Per Week/30 Days Discharge Instructions: As directed by physician Topical: Mupirocin Ointment 1 x Per Week/30 Days Discharge Instructions: Apply Mupirocin (Bactroban) as instructed Prim Dressing: Maxorb Extra Ag+ Alginate Dressing, 4x4.75 (in/in) 1 x Per Week/30 Days ary Discharge Instructions: Apply to wound bed as instructed Secondary Dressing: ABD Pad, 5x9 1 x Per Week/30 Days Discharge Instructions: Apply over primary dressing as directed. Secondary Dressing: CarboFLEX Odor Control Dressing, 4x4 in 1 x Per Week/30 Days Discharge Instructions: Apply over primary dressing as directed. Com pression Wrap: Urgo K2, (equivalent to a 4 layer) two layer compression system, regular 1 x Per Week/30 Days Discharge Instructions: Apply Urgo K2 as directed (alternative to 4 layer compression). 1. Silver alginate with antibiotic ointment under 4-layer equivalent compression wrap to the left lower extremity 2. Juxta fit to the right lower extremity daily 3. Follow-up in 1 week Electronic Signature(s) Signed: 12/25/2022 4:43:58 PM By: Geralyn Corwin DO Entered By: Geralyn Corwin on 12/25/2022 15:24:28 -------------------------------------------------------------------------------- HxROS Details Patient Name: Date of Service: [name], [name] 12/25/2022 2:00 PM Medical Record Number: [id number] Patient Account Number: [id number] Date of Birth/Sex: Treating RN: 1950/12/08 (72 y.o. M) Primary Care Provider: Everrett Coombe Other  Clinician: Referring Provider: Treating Provider/Extender: Cheri Guppy in Treatment: 11 Information Obtained From Patient Chart Hematologic/Lymphatic Medical History: Positive for: Anemia; Lymphedema - Bilateral Lower legs Respiratory Medical History: Past Medical History Notes: Hx: Pulmonary Embolism Cardiovascular Medical History: Positive for: Congestive Heart Failure; Deep Vein Thrombosis Gastrointestinal Medical History: Past Medical History Notes: GERD Endocrine Medical History: Positive for: Type II Diabetes Past Medical History Notes: Hx: Hypothyroidism Time with diabetes: 33 years Treated with: Insulin Genitourinary [name], [name] ([id number]) [id number] Page 8 of 9 Medical History: Past Medical History Notes: YQ:[id number] Immunological Medical History: Past Medical History Notes: WU:[id number] disease Musculoskeletal Medical History: Positive for: Rheumatoid Arthritis Past Medical History Notes: Hx: Necrotizing Myopathy Psychiatric Medical History: Past Medical History Notes: PTSD Immunizations Pneumococcal Vaccine: Received Pneumococcal Vaccination: Yes Received Pneumococcal Vaccination On or After 60th Birthday: No Implantable Devices None Hospitalization / Surgery History Type of Hospitalization/Surgery Biopsy left Shoulder; Angiogram Pulmonary Bilateral Family and Social History Never smoker; Marital Status - Married; Alcohol Use: Never; Drug Use: No History; Caffeine Use: Daily -  coffee; Financial Concerns: No; Food, Clothing or Shelter Needs: No; Support System Lacking: No; Transportation Concerns: No Electronic Signature(s) Signed: 12/25/2022 4:43:58 PM By: Geralyn Corwin DO Entered By: Geralyn Corwin on 12/25/2022 15:22:23 -------------------------------------------------------------------------------- SuperBill Details Patient Name: Date of Service: [name], [name]  12/25/2022 Medical Record Number: [id number] Patient Account Number: [id number] Date of Birth/Sex: Treating RN: 06-Aug-1950 (72 y.o. M) Primary Care Provider: Everrett Coombe Other Clinician: Referring Provider: Treating Provider/Extender: Cheri Guppy in Treatment: 11 Diagnosis Coding ICD-10 Codes Code Description [phone] Chronic venous hypertension (idiopathic) with ulcer of left lower extremity I87.311 Chronic venous hypertension (idiopathic) with ulcer of right lower extremity S81.802A Unspecified open wound, left lower leg, initial encounter S81.801A Unspecified open wound, right lower leg, initial encounter I89.0 Lymphedema, not elsewhere classified 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, not elsewhere classified Facility Procedures [name], [name] ([id number]): CPT4 Code Description [phone] (Facility Use Only) [phone] - APPLY MULTLAY COMPRS LWR LT LEG [id number] Page 9 of 9: Modifier Quantity 1 Physician Procedures : CPT4 Code Description Modifier [phone] - WC PHYS LEVEL 3 - EST PT ICD-10 Diagnosis Description I87.312 Chronic venous hypertension (idiopathic) with ulcer of left lower extremity I87.311 Chronic venous hypertension (idiopathic) with ulcer of  right lower extremity S81.802A Unspecified open wound, left lower leg, initial encounter S81.801A Unspecified open wound, right lower leg, initial encounter Quantity: 1 Electronic Signature(s) Signed: 12/25/2022 4:35:22 PM By: Redmond Pulling RN, BSN Signed: 12/25/2022 4:43:58 PM By: Geralyn Corwin DO Entered By: Redmond Pulling on 12/25/2022 16:32:09

## 2022-12-25 NOTE — Progress Notes (Signed)
SHEAN, DEMOS ([id number]) [id number] Page 1 of 9 Visit Report for 12/20/2022 Arrival Information Details Patient Name: Date of Service: [name], [name] 12/20/2022 10:15 A M Medical Record Number: [id number] Patient Account Number: [id number] Date of Birth/Sex: Treating RN: 04/24/1951 (72 y.o. [name] Primary Care Rushi Chasen: Everrett Coombe Other Clinician: Referring Reta Norgren: Treating Alveena Taira/Extender: Cheri Guppy in Treatment: 11 Visit Information History Since Last Visit All ordered tests and consults were completed: Yes Patient Arrived: Wheel Chair Added or deleted any medications: No Arrival Time: 10:27 Any new allergies or adverse reactions: No Accompanied By: spouse Had a fall or experienced change in No Transfer Assistance: None activities of daily living that may affect Patient Identification Verified: Yes risk of falls: Secondary Verification Process Completed: Yes Signs or symptoms of abuse/neglect since last visito No Patient Requires Transmission-Based Precautions: No Hospitalized since last visit: No Patient Has Alerts: Yes Implantable device outside of the clinic excluding No Patient Alerts: ELIQUIS cellular tissue based products placed in the center since last visit: Has Dressing in Place as Prescribed: Yes Pain Present Now: No Electronic Signature(s) Signed: 12/25/2022 2:02:59 PM By: Brenton Grills Entered By: Brenton Grills on 12/20/2022 10:37:06 -------------------------------------------------------------------------------- Compression Therapy Details Patient Name: Date of Service: [name], [name] 12/20/2022 10:15 A M Medical Record Number: [id number] Patient Account Number: [id number] Date of Birth/Sex: Treating RN: 1950-12-01 (72 y.o. Tammy Sours Primary Care Srihitha Tagliaferri: Everrett Coombe Other Clinician: Referring Aison Malveaux: Treating Merdith Adan/Extender: Cheri Guppy in  Treatment: 11 Compression Therapy Performed for Wound Assessment: Wound #10 Left,Anterior Lower Leg Performed By: Clinician Shawn Stall, RN Compression Type: Double Layer Post Procedure Diagnosis Same as Pre-procedure Electronic Signature(s) Signed: 12/20/2022 5:48:32 PM By: Shawn Stall RN, BSN Entered By: Shawn Stall on 12/20/2022 10:58:25 [place], [name] ([id number]) [id number] Page 2 of 9 -------------------------------------------------------------------------------- Compression Therapy Details Patient Name: Date of Service: [name], [name] 12/20/2022 10:15 A M Medical Record Number: [id number] Patient Account Number: [id number] Date of Birth/Sex: Treating RN: December 21, 1950 (73 y.o. Tammy Sours Primary Care Kayzen Kendzierski: Everrett Coombe Other Clinician: Referring Chrishawna Farina: Treating Krisanne Lich/Extender: Cheri Guppy in Treatment: 11 Compression Therapy Performed for Wound Assessment: Wound #11 Right,Lateral Lower Leg Performed By: Clinician Shawn Stall, RN Compression Type: Double Layer Post Procedure Diagnosis Same as Pre-procedure Electronic Signature(s) Signed: 12/20/2022 5:48:32 PM By: Shawn Stall RN, BSN Entered By: Shawn Stall on 12/20/2022 10:58:25 -------------------------------------------------------------------------------- Encounter Discharge Information Details Patient Name: Date of Service: [name], [name] 12/20/2022 10:15 A M Medical Record Number: [id number] Patient Account Number: [id number] Date of Birth/Sex: Treating RN: 1951/04/11 (72 y.o. Tammy Sours Primary Care Shizuo Biskup: Everrett Coombe Other Clinician: Referring Ashtan Girtman: Treating Macy Polio/Extender: Cheri Guppy in Treatment: 11 Encounter Discharge Information Items Discharge Condition: Stable Ambulatory Status: Wheelchair Discharge Destination: Home Transportation: Private Auto Accompanied By: wife Schedule Follow-up  Appointment: Yes Clinical Summary of Care: Electronic Signature(s) Signed: 12/20/2022 5:48:32 PM By: Shawn Stall RN, BSN Entered By: Shawn Stall on 12/20/2022 11:03:02 -------------------------------------------------------------------------------- Lower Extremity Assessment Details Patient Name: Date of Service: [name], [name] 12/20/2022 10:15 A M Medical Record Number: [id number] Patient Account Number: [id number] Date of Birth/Sex: Treating RN: 1950/10/15 (72 y.o. [name] Primary Care Aleina Burgio: Everrett Coombe Other Clinician: Referring Launa Goedken: Treating Caterra Ostroff/Extender: Sumner Boast Weeks in Treatment: 11 Edema Assessment Assessed: [Left: No] [name]: No] Edema: [Left: Yes] [Right: Yes] Calf Stenzel, Fermon ([id number]) [id number] Page 3 of 9 Left: Right: Point of Measurement: 37 cm From Medial Instep 49 cm  48.5 cm Ankle Left: Right: Point of Measurement: 10 cm From Medial Instep 29.4 cm 29.5 cm Vascular Assessment Pulses: Dorsalis Pedis Palpable: [Left:Yes] [Right:Yes] Extremity colors, hair growth, and conditions: Extremity Color: [Left:Normal] [Right:Normal] Hair Growth on Extremity: [Left:No] [Right:No] Temperature of Extremity: [Left:Warm] [Right:Warm] Capillary Refill: [Left:< 3 seconds] [Right:< 3 seconds] Dependent Rubor: [Left:No No] [Right:No No] Electronic Signature(s) Signed: 12/25/2022 2:02:59 PM By: Brenton Grills Entered By: Brenton Grills on 12/20/2022 10:43:33 -------------------------------------------------------------------------------- Multi Wound Chart Details Patient Name: Date of Service: [name] 12/20/2022 10:15 A M Medical Record Number: [id number] Patient Account Number: [id number] Date of Birth/Sex: Treating RN: [birth year] (72 y.o. M) Primary Care Naiara Lombardozzi: Everrett Coombe Other Clinician: Referring Rodrigues Urbanek: Treating Venetta Knee/Extender: Cheri Guppy in  Treatment: 11 Vital Signs Height(in): 68 Pulse(bpm): 80 Weight(lbs): 220 Blood Pressure(mmHg): 132/82 Body Mass Index(BMI): 33.4 Temperature(F): 98.3 Respiratory Rate(breaths/min): 18 [10:Photos:] [N/A:N/A] Left, Anterior Lower Leg Right, Lateral Lower Leg N/A Wound Location: Blister Blister N/A Wounding Event: Lymphedema Lymphedema N/A Primary Etiology: Anemia, Lymphedema, Congestive Anemia, Lymphedema, Congestive N/A Comorbid History: Heart Failure, Deep Vein Thrombosis, Heart Failure, Deep Vein Thrombosis, Type II Diabetes, Rheumatoid Arthritis Type II Diabetes, Rheumatoid Arthritis 12/10/2022 12/10/2022 N/A Date Acquired: 1 1 N/A Weeks of Treatment: Open Open N/A Wound Status: No No N/A Wound Recurrence: 2x1.5x0.1 0.8x0.7x0.1 N/A Measurements L x W x D (cm) 2.356 0.44 N/A A (cm) : rea 0.236 0.044 N/A Volume (cm) : 25.00% 43.90% N/A % Reduction in Area: 24.80% 44.30% N/A % Reduction in Volume: Full Thickness Without Exposed Full Thickness Without Exposed N/A Classification: [name] ([id number]) [id number] Page 4 of 9 Support Structures Support Structures Medium Medium N/A Exudate Amount: Serosanguineous Serosanguineous N/A Exudate Type: red, brown red, brown N/A Exudate Color: Distinct, outline attached Distinct, outline attached N/A Wound Margin: Large (67-100%) Large (67-100%) N/A Granulation Amount: N/A Red N/A Granulation Quality: None Present (0%) None Present (0%) N/A Necrotic Amount: Fat Layer (Subcutaneous Tissue): Yes Fat Layer (Subcutaneous Tissue): Yes N/A Exposed Structures: Fascia: No Fascia: No Tendon: No Tendon: No Muscle: No Muscle: No Joint: No Joint: No Bone: No Bone: No Small (1-33%) Small (1-33%) N/A Epithelialization: Excoriation: No Excoriation: No N/A Periwound Skin Texture: Induration: No Induration: No Callus: No Callus: No Crepitus: No Crepitus: No Rash: No Rash: No Scarring:  No Scarring: No Maceration: No Maceration: No N/A Periwound Skin Moisture: Dry/Scaly: No Dry/Scaly: No Atrophie Blanche: No Atrophie Blanche: No N/A Periwound Skin Color: Cyanosis: No Cyanosis: No Ecchymosis: No Ecchymosis: No Erythema: No Erythema: No Hemosiderin Staining: No Hemosiderin Staining: No Mottled: No Mottled: No Pallor: No Pallor: No Rubor: No Rubor: No N/A No Abnormality N/A Temperature: Compression Therapy Compression Therapy N/A Procedures Performed: Treatment Notes Wound #10 (Lower Leg) Wound Laterality: Left, Anterior Cleanser Soap and Water Discharge Instruction: May shower and wash wound with dial antibacterial soap and water prior to dressing change. Wound Cleanser Discharge Instruction: Cleanse the wound with wound cleanser prior to applying a clean dressing using gauze sponges, not tissue or cotton balls. Peri-Wound Care Sween Lotion (Moisturizing lotion) Discharge Instruction: Apply moisturizing lotion as directed Topical Gentamicin Discharge Instruction: As directed by physician Mupirocin Ointment Discharge Instruction: Apply Mupirocin (Bactroban) as instructed Primary Dressing Maxorb Extra Ag+ Alginate Dressing, 4x4.75 (in/in) Discharge Instruction: Apply to wound bed as instructed Secondary Dressing ABD Pad, 5x9 Discharge Instruction: Apply over primary dressing as directed. CarboFLEX Odor Control Dressing, 4x4 in Discharge Instruction: Apply over primary dressing as directed. Secured With Compression Wrap Urgo K2, (equivalent to a 4  layer) two layer compression system, regular Discharge Instruction: Apply Urgo K2 as directed (alternative to 4 layer compression). Compression Stockings Add-Ons Wound #11 (Lower Leg) Wound Laterality: Right, Lateral Cleanser Soap and Water Discharge Instruction: May shower and wash wound with dial antibacterial soap and water prior to dressing change. RAFER, KAPRAL ([id number])  [id number] Page 5 of 9 Wound Cleanser Discharge Instruction: Cleanse the wound with wound cleanser prior to applying a clean dressing using gauze sponges, not tissue or cotton balls. Peri-Wound Care Sween Lotion (Moisturizing lotion) Discharge Instruction: Apply moisturizing lotion as directed Topical Gentamicin Discharge Instruction: As directed by physician Mupirocin Ointment Discharge Instruction: Apply Mupirocin (Bactroban) as instructed Primary Dressing Maxorb Extra Ag+ Alginate Dressing, 4x4.75 (in/in) Discharge Instruction: Apply to wound bed as instructed Secondary Dressing ABD Pad, 5x9 Discharge Instruction: Apply over primary dressing as directed. CarboFLEX Odor Control Dressing, 4x4 in Discharge Instruction: Apply over primary dressing as directed. Secured With Compression Wrap Urgo K2, (equivalent to a 4 layer) two layer compression system, regular Discharge Instruction: Apply Urgo K2 as directed (alternative to 4 layer compression). Compression Stockings Add-Ons Electronic Signature(s) Signed: 12/24/2022 1:35:31 PM By: Geralyn Corwin DO Entered By: Geralyn Corwin on 12/20/2022 11:22:39 -------------------------------------------------------------------------------- Multi-Disciplinary Care Plan Details Patient Name: Date of Service: [name], [name] 12/20/2022 10:15 A M Medical Record Number: [id number] Patient Account Number: [id number] Date of Birth/Sex: Treating RN: 1951/01/08 (72 y.o. Tammy Sours Primary Care Heavenlee Maiorana: Everrett Coombe Other Clinician: Referring Shanon Seawright: Treating Sejla Marzano/Extender: Cheri Guppy in Treatment: 11 Active Inactive Wound/Skin Impairment Nursing Diagnoses: Impaired tissue integrity Goals: Patient/caregiver will verbalize understanding of skin care regimen Date Initiated: 10/04/2022 Target Resolution Date: 01/25/2023 Goal Status: Active Interventions: Assess ulceration(s)  every visit Treatment Activities: Skin care regimen initiated : 10/04/2022 MELVINE, PHANEUF ([id number]) [id number] Page 6 of 9 Notes: Electronic Signature(s) Signed: 12/20/2022 5:48:32 PM By: Shawn Stall RN, BSN Entered By: Shawn Stall on 12/20/2022 10:56:52 -------------------------------------------------------------------------------- Pain Assessment Details Patient Name: Date of Service: [name], [name] 12/20/2022 10:15 A M Medical Record Number: [id number] Patient Account Number: [id number] Date of Birth/Sex: Treating RN: [birth year] (72 y.o. [name] Primary Care Sabre Leonetti: Everrett Coombe Other Clinician: Referring Nevaeha Finerty: Treating Kimoni Pagliarulo/Extender: Cheri Guppy in Treatment: 11 Active Problems Location of Pain Severity and Description of Pain Patient Has Paino No Site Locations Pain Management and Medication Current Pain Management: Electronic Signature(s) Signed: 12/25/2022 2:02:59 PM By: Brenton Grills Entered By: Brenton Grills on 12/20/2022 10:43:14 -------------------------------------------------------------------------------- Patient/Caregiver Education Details Patient Name: Date of Service: [name] 8/22/2024andnbsp10:15 A M Medical Record Number: [id number] Patient Account Number: [id number] Date of Birth/Gender: Treating RN: [birth year] (72 y.o. Tammy Sours Primary Care Physician: Everrett Coombe Other Clinician: Referring Physician: Treating Physician/Extender: Cheri Guppy in Treatment: [address] [place], [place] ([id number]) [id number] Page 7 of 9 Education Provided To: Patient Education Topics Provided Wound/Skin Impairment: Handouts: Caring for Your Ulcer Methods: Explain/Verbal Responses: Reinforcements needed Electronic Signature(s) Signed: 12/20/2022 5:48:32 PM By: Shawn Stall RN, BSN Entered By: Shawn Stall on 12/20/2022  10:57:05 -------------------------------------------------------------------------------- Wound Assessment Details Patient Name: Date of Service: [name], [name] 12/20/2022 10:15 A M Medical Record Number: [id number] Patient Account Number: [id number] Date of Birth/Sex: Treating RN: 03-10-1951 (72 y.o. [name] Primary Care Sylar Voong: Everrett Coombe Other Clinician: Referring Dylanie Quesenberry: Treating Tenaya Hilyer/Extender: Cheri Guppy in Treatment: 11 Wound Status Wound Number: 10 Primary Lymphedema Etiology: Wound Location: Left, Anterior Lower Leg Wound Open Wounding Event: Blister Status: Date Acquired: 12/10/2022  Comorbid Anemia, Lymphedema, Congestive Heart Failure, Deep Vein Weeks Of Treatment: 1 History: Thrombosis, Type II Diabetes, Rheumatoid Arthritis Clustered Wound: No Photos Wound Measurements Length: (cm) 2 Width: (cm) 1.5 Depth: (cm) 0.1 Area: (cm) 2.356 Volume: (cm) 0.236 % Reduction in Area: 25% % Reduction in Volume: 24.8% Epithelialization: Small (1-33%) Tunneling: No Undermining: No Wound Description Classification: Full Thickness Without Exposed Support Wound Margin: Distinct, outline attached Exudate Amount: Medium Exudate Type: Serosanguineous Exudate Color: red, brown Structures Foul Odor After Cleansing: No Slough/Fibrino No Wound Bed Granulation Amount: Large (67-100%) Exposed Structure Necrotic Amount: None Present (0%) Fascia Exposed: No Fat Layer (Subcutaneous Tissue) Exposed: Yes Tendon Exposed: No Thaxton, Boy ([id number]) [id number] Page 8 of 9 Muscle Exposed: No Joint Exposed: No Bone Exposed: No Periwound Skin Texture Texture Color No Abnormalities Noted: No No Abnormalities Noted: No Callus: No Atrophie Blanche: No Crepitus: No Cyanosis: No Excoriation: No Ecchymosis: No Induration: No Erythema: No Rash: No Hemosiderin Staining: No Scarring: No Mottled: No Pallor:  No Moisture Rubor: No No Abnormalities Noted: No Dry / Scaly: No Maceration: No Electronic Signature(s) Signed: 12/25/2022 2:02:59 PM By: Brenton Grills Entered By: Brenton Grills on 12/20/2022 10:48:28 -------------------------------------------------------------------------------- Wound Assessment Details Patient Name: Date of Service: [name], [name] 12/20/2022 10:15 A M Medical Record Number: [id number] Patient Account Number: [id number] Date of Birth/Sex: Treating RN: [birth year] (72 y.o. [name] Primary Care Balin Vandegrift: Everrett Coombe Other Clinician: Referring Duvall Comes: Treating Lalita Ebel/Extender: Cheri Guppy in Treatment: 11 Wound Status Wound Number: 11 Primary Lymphedema Etiology: Wound Location: Right, Lateral Lower Leg Wound Open Wounding Event: Blister Status: Date Acquired: 12/10/2022 Comorbid Anemia, Lymphedema, Congestive Heart Failure, Deep Vein Weeks Of Treatment: 1 History: Thrombosis, Type II Diabetes, Rheumatoid Arthritis Clustered Wound: No Photos Wound Measurements Length: (cm) 0.8 Width: (cm) 0.7 Depth: (cm) 0.1 Area: (cm) 0.44 Volume: (cm) 0.044 % Reduction in Area: 43.9% % Reduction in Volume: 44.3% Epithelialization: Small (1-33%) Tunneling: No Undermining: No Wound Description Classification: Full Thickness Without Exposed Support Structures Wound Margin: Distinct, outline attached Exudate Amount: Medium Exudate Type: Serosanguineous Whitener, Cordera ([id number]) Exudate Color: red, brown Foul Odor After Cleansing: No Slough/Fibrino No [id number] Page 9 of 9 Wound Bed Granulation Amount: Large (67-100%) Exposed Structure Granulation Quality: Red Fascia Exposed: No Necrotic Amount: None Present (0%) Fat Layer (Subcutaneous Tissue) Exposed: Yes Tendon Exposed: No Muscle Exposed: No Joint Exposed: No Bone Exposed: No Periwound Skin Texture Texture Color No Abnormalities Noted:  No No Abnormalities Noted: No Callus: No Atrophie Blanche: No Crepitus: No Cyanosis: No Excoriation: No Ecchymosis: No Induration: No Erythema: No Rash: No Hemosiderin Staining: No Scarring: No Mottled: No Pallor: No Moisture Rubor: No No Abnormalities Noted: No Dry / Scaly: No Temperature / Pain Maceration: No Temperature: No Abnormality Electronic Signature(s) Signed: 12/25/2022 2:02:59 PM By: Brenton Grills Entered By: Brenton Grills on 12/20/2022 10:49:00 -------------------------------------------------------------------------------- Vitals Details Patient Name: Date of Service: [name] 12/20/2022 10:15 A M Medical Record Number: [id number] Patient Account Number: [id number] Date of Birth/Sex: Treating RN: [birth year] (73 y.o. [name] Primary Care Doy Taaffe: Everrett Coombe Other Clinician: Referring Zeva Leber: Treating Babita Amaker/Extender: Cheri Guppy in Treatment: 11 Vital Signs Time Taken: 10:42 Temperature (F): 98.3 Height (in): 68 Pulse (bpm): 80 Weight (lbs): 220 Respiratory Rate (breaths/min): 18 Body Mass Index (BMI): 33.4 Blood Pressure (mmHg): 132/82 Reference Range: 80 - 120 mg / dl Electronic Signature(s) Signed: 12/25/2022 2:02:59 PM By: Brenton Grills Entered By: Brenton Grills on 12/20/2022 10:50:18

## 2022-12-25 NOTE — Progress Notes (Signed)
[name] ([id number]) [id number] Page 1 of 9 Visit Report for 12/25/2022 Arrival Information Details Patient Name: Date of Service: [name], [name] 12/25/2022 2:00 PM Medical Record Number: [id number] Patient Account Number: [id number] Date of Birth/Sex: Treating RN: 08-May-1950 (72 y.o. Cline Cools Primary Care Lakeisa Heninger: Everrett Coombe Other Clinician: Referring Alesha Jaffee: Treating Avalynne Diver/Extender: Cheri Guppy in Treatment: 11 Visit Information History Since Last Visit Added or deleted any medications: No Patient Arrived: Dan Humphreys Any new allergies or adverse reactions: No Arrival Time: 14:13 Had a fall or experienced change in No Transfer Assistance: None activities of daily living that may affect Patient Identification Verified: Yes risk of falls: Secondary Verification Process Completed: Yes Signs or symptoms of abuse/neglect since last visito No Patient Requires Transmission-Based Precautions: No Hospitalized since last visit: No Patient Has Alerts: Yes Implantable device outside of the clinic excluding No Patient Alerts: ELIQUIS cellular tissue based products placed in the center since last visit: Has Dressing in Place as Prescribed: Yes Has Compression in Place as Prescribed: Yes Pain Present Now: No Electronic Signature(s) Signed: 12/25/2022 4:35:22 PM By: Redmond Pulling RN, BSN Entered By: Redmond Pulling on 12/25/2022 14:14:16 -------------------------------------------------------------------------------- Compression Therapy Details Patient Name: Date of Service: [name] 12/25/2022 2:00 PM Medical Record Number: [id number] Patient Account Number: [id number] Date of Birth/Sex: Treating RN: 24-Dec-1950 (72 y.o. Cline Cools Primary Care Edrian Melucci: Everrett Coombe Other Clinician: Referring Jasman Murri: Treating Quanah Majka/Extender: Cheri Guppy in Treatment: 11 Compression Therapy  Performed for Wound Assessment: Wound #10 Left,Anterior Lower Leg Performed By: Clinician Redmond Pulling, RN Compression Type: Four Layer Post Procedure Diagnosis Same as Pre-procedure Electronic Signature(s) Signed: 12/25/2022 4:35:22 PM By: Redmond Pulling RN, BSN Entered By: Redmond Pulling on 12/25/2022 15:01:02 Sasso, [name] ([id number]) [id number] Page 2 of 9 -------------------------------------------------------------------------------- Encounter Discharge Information Details Patient Name: Date of Service: [name] 12/25/2022 2:00 PM Medical Record Number: [id number] Patient Account Number: [id number] Date of Birth/Sex: Treating RN: 1951-03-16 (72 y.o. Cline Cools Primary Care Makaelyn Aponte: Everrett Coombe Other Clinician: Referring Salinda Snedeker: Treating Mujtaba Bollig/Extender: Cheri Guppy in Treatment: 11 Encounter Discharge Information Items Discharge Condition: Stable Ambulatory Status: Wheelchair Discharge Destination: Home Transportation: Private Auto Accompanied By: wife Schedule Follow-up Appointment: Yes Clinical Summary of Care: Patient Declined Electronic Signature(s) Signed: 12/25/2022 4:35:22 PM By: Redmond Pulling RN, BSN Entered By: Redmond Pulling on 12/25/2022 15:02:06 -------------------------------------------------------------------------------- Lower Extremity Assessment Details Patient Name: Date of Service: [name] 12/25/2022 2:00 PM Medical Record Number: [id number] Patient Account Number: [id number] Date of Birth/Sex: Treating RN: [birth year] (72 y.o. Cline Cools Primary Care Symone Cornman: Everrett Coombe Other Clinician: Referring Bera Pinela: Treating Teja Costen/Extender: Cheri Guppy in Treatment: 11 Edema Assessment Assessed: [name]: No] [name]: No] Edema: [Left: Yes] [Right: Yes] Calf Left: Right: Point of Measurement: 37 cm From Medial Instep 47 cm 48 cm Ankle Left:  Right: Point of Measurement: 10 cm From Medial Instep 25.5 cm 26 cm Vascular Assessment Pulses: Dorsalis Pedis Palpable: [Left:Yes] [Right:Yes] Extremity colors, hair growth, and conditions: Extremity Color: [Left:Normal] [Right:Normal] Hair Growth on Extremity: [Left:No] [Right:No] Temperature of Extremity: [Left:Warm] [Right:Warm] Capillary Refill: [Left:< 3 seconds] [Right:< 3 seconds] Dependent Rubor: [Left:No No] [Right:No No] Electronic Signature(s) Signed: 12/25/2022 4:35:22 PM By: Redmond Pulling RN, BSN Entered By: Redmond Pulling on 12/25/2022 14:29:39 Irion, Donovyn ([id number]) [id number] Page 3 of 9 -------------------------------------------------------------------------------- Multi Wound Chart Details Patient Name: Date of Service: [name] 12/25/2022 2:00 PM Medical Record Number: [id number] Patient Account Number: [id number] Date of  Birth/Sex: Treating RN: May 17, 1950 (72 y.o. M) Primary Care Harshal Sirmon: Everrett Coombe Other Clinician: Referring Taqwa Deem: Treating Nas Wafer/Extender: Cheri Guppy in Treatment: 11 Vital Signs Height(in): 68 Pulse(bpm): 96 Weight(lbs): 220 Blood Pressure(mmHg): 182/70 Body Mass Index(BMI): 33.4 Temperature(F): 98.6 Respiratory Rate(breaths/min): 18 [10:Photos:] [N/A:N/A] Left, Anterior Lower Leg Right, Lateral Lower Leg N/A Wound Location: Blister Blister N/A Wounding Event: Lymphedema Lymphedema N/A Primary Etiology: Anemia, Lymphedema, Congestive Anemia, Lymphedema, Congestive N/A Comorbid History: Heart Failure, Deep Vein Thrombosis, Heart Failure, Deep Vein Thrombosis, Type II Diabetes, Rheumatoid Arthritis Type II Diabetes, Rheumatoid Arthritis 12/10/2022 12/10/2022 N/A Date Acquired: 2 2 N/A Weeks of Treatment: Open Open N/A Wound Status: No No N/A Wound Recurrence: 0.3x0.3x0.1 0x0x0 N/A Measurements L x W x D (cm) 0.071 0 N/A A (cm) : rea 0.007 0 N/A Volume  (cm) : 97.70% 100.00% N/A % Reduction in Area: 97.80% 100.00% N/A % Reduction in Volume: Full Thickness Without Exposed Full Thickness Without Exposed N/A Classification: Support Structures Support Structures Medium Medium N/A Exudate Amount: Serosanguineous Serosanguineous N/A Exudate Type: red, brown red, brown N/A Exudate Color: Distinct, outline attached Distinct, outline attached N/A Wound Margin: Large (67-100%) Large (67-100%) N/A Granulation Amount: N/A Red N/A Granulation Quality: None Present (0%) None Present (0%) N/A Necrotic Amount: Fat Layer (Subcutaneous Tissue): Yes Fat Layer (Subcutaneous Tissue): Yes N/A Exposed Structures: Fascia: No Fascia: No Tendon: No Tendon: No Muscle: No Muscle: No Joint: No Joint: No Bone: No Bone: No Small (1-33%) Small (1-33%) N/A Epithelialization: Excoriation: No Excoriation: No N/A Periwound Skin Texture: Induration: No Induration: No Callus: No Callus: No Crepitus: No Crepitus: No Rash: No Rash: No Scarring: No Scarring: No Maceration: No Maceration: No N/A Periwound Skin Moisture: Dry/Scaly: No Dry/Scaly: No Atrophie Blanche: No Atrophie Blanche: No N/A Periwound Skin Color: Cyanosis: No Cyanosis: No Ecchymosis: No Ecchymosis: No Erythema: No Erythema: No Hemosiderin Staining: No Hemosiderin Staining: No Mottled: No Mottled: No Pallor: No Pallor: No Rubor: No Rubor: No N/A No Abnormality N/A Temperature: Strough, Keondre ([id number]) [id number] Page 4 of 9 Compression Therapy N/A N/A Procedures Performed: Treatment Notes Wound #10 (Lower Leg) Wound Laterality: Left, Anterior Cleanser Soap and Water Discharge Instruction: May shower and wash wound with dial antibacterial soap and water prior to dressing change. Wound Cleanser Discharge Instruction: Cleanse the wound with wound cleanser prior to applying a clean dressing using gauze sponges, not tissue or cotton  balls. Peri-Wound Care Sween Lotion (Moisturizing lotion) Discharge Instruction: Apply moisturizing lotion as directed Topical Gentamicin Discharge Instruction: As directed by physician Mupirocin Ointment Discharge Instruction: Apply Mupirocin (Bactroban) as instructed Primary Dressing Maxorb Extra Ag+ Alginate Dressing, 4x4.75 (in/in) Discharge Instruction: Apply to wound bed as instructed Secondary Dressing ABD Pad, 5x9 Discharge Instruction: Apply over primary dressing as directed. CarboFLEX Odor Control Dressing, 4x4 in Discharge Instruction: Apply over primary dressing as directed. Secured With Compression Wrap Urgo K2, (equivalent to a 4 layer) two layer compression system, regular Discharge Instruction: Apply Urgo K2 as directed (alternative to 4 layer compression). Compression Stockings Add-Ons Wound #11 (Lower Leg) Wound Laterality: Right, Lateral Cleanser Peri-Wound Care Topical Primary Dressing Secondary Dressing Secured With Compression Wrap Compression Stockings Add-Ons Electronic Signature(s) Signed: 12/25/2022 4:43:58 PM By: Geralyn Corwin DO Entered By: Geralyn Corwin on 12/25/2022 15:20:50 Blanks, Cordaro ([id number]) [id number] Page 5 of 9 -------------------------------------------------------------------------------- Multi-Disciplinary Care Plan Details Patient Name: Date of Service: ELVEN, GROOME 12/25/2022 2:00 PM Medical Record Number: [id number] Patient Account Number: [id number] Date of Birth/Sex: Treating RN: 07-Dec-1950 (72 y.o. Cline Cools  Primary Care Kasen Sako: Everrett Coombe Other Clinician: Referring Quanisha Drewry: Treating Mahamed Zalewski/Extender: Cheri Guppy in Treatment: 11 Active Inactive Wound/Skin Impairment Nursing Diagnoses: Impaired tissue integrity Goals: Patient/caregiver will verbalize understanding of skin care regimen Date Initiated: 10/04/2022 Target Resolution Date:  01/25/2023 Goal Status: Active Interventions: Assess ulceration(s) every visit Treatment Activities: Skin care regimen initiated : 10/04/2022 Notes: Electronic Signature(s) Signed: 12/25/2022 4:35:22 PM By: Redmond Pulling RN, BSN Entered By: Redmond Pulling on 12/25/2022 14:37:29 -------------------------------------------------------------------------------- Pain Assessment Details Patient Name: Date of Service: MARON, ROSHTO 12/25/2022 2:00 PM Medical Record Number: [id number] Patient Account Number: [id number] Date of Birth/Sex: Treating RN: 1950-06-17 (72 y.o. Cline Cools Primary Care Cosimo Schertzer: Everrett Coombe Other Clinician: Referring Zamar Odwyer: Treating Saje Gallop/Extender: Cheri Guppy in Treatment: 11 Active Problems Location of Pain Severity and Description of Pain Patient Has Paino Yes Site Locations Rate the pain. Current Pain Level: 4 Pain Management and Medication Meggett, Gagan ([id number]) [phone].pdf Page 6 of 9 Current Pain Management: Electronic Signature(s) Signed: 12/25/2022 4:35:22 PM By: Redmond Pulling RN, BSN Entered By: Redmond Pulling on 12/25/2022 14:15:34 -------------------------------------------------------------------------------- Patient/Caregiver Education Details Patient Name: Date of Service: PASTOR, YELL 8/27/2024andnbsp2:00 PM Medical Record Number: [id number] Patient Account Number: [id number] Date of Birth/Gender: Treating RN: 02-05-1951 (73 y.o. Cline Cools Primary Care Physician: Everrett Coombe Other Clinician: Referring Physician: Treating Physician/Extender: Cheri Guppy in Treatment: 11 Education Assessment Education Provided To: Patient Education Topics Provided Wound/Skin Impairment: Methods: Explain/Verbal Responses: State content correctly [employer]) Signed: 12/25/2022 4:35:22 PM By: Redmond Pulling RN, BSN Entered By: Redmond Pulling on  12/25/2022 14:38:19 -------------------------------------------------------------------------------- Wound Assessment Details Patient Name: Date of Service: TRAYLEN, HARTSHORN 12/25/2022 2:00 PM Medical Record Number: [id number] Patient Account Number: [id number] Date of Birth/Sex: Treating RN: 1951/02/27 (72 y.o. Cline Cools Primary Care Tylie Golonka: Everrett Coombe Other Clinician: Referring Zakiyyah Savannah: Treating [place] Chelf/Extender: Cheri Guppy in Treatment: 11 Wound Status Wound Number: 10 Primary Lymphedema Etiology: Wound Location: Left, Anterior Lower Leg Wound Open Wounding Event: Blister Status: Date Acquired: 12/10/2022 Comorbid Anemia, Lymphedema, Congestive Heart Failure, Deep Vein Weeks Of Treatment: 2 History: Thrombosis, Type II Diabetes, Rheumatoid Arthritis Clustered Wound: No Photos Smullen, [name] ([id number]) [phone].pdf Page 7 of 9 Wound Measurements Length: (cm) 0.3 Width: (cm) 0.3 Depth: (cm) 0.1 Area: (cm) 0.071 Volume: (cm) 0.007 % Reduction in Area: 97.7% % Reduction in Volume: 97.8% Epithelialization: Small (1-33%) Tunneling: No Undermining: No Wound Description Classification: Full Thickness Without Exposed Support Structures Wound Margin: Distinct, outline attached Exudate Amount: Medium Exudate Type: Serosanguineous Exudate Color: red, brown Foul Odor After Cleansing: No Slough/Fibrino No Wound Bed Granulation Amount: Large (67-100%) Exposed Structure Necrotic Amount: None Present (0%) Fascia Exposed: No Fat Layer (Subcutaneous Tissue) Exposed: Yes Tendon Exposed: No Muscle Exposed: No Joint Exposed: No Bone Exposed: No Periwound Skin Texture Texture Color No Abnormalities Noted: No No Abnormalities Noted: No Callus: No Atrophie Blanche: No Crepitus: No Cyanosis: No Excoriation: No Ecchymosis: No Induration: No Erythema: No Rash: No Hemosiderin Staining: No Scarring: No Mottled:  No Pallor: No Moisture Rubor: No No Abnormalities Noted: No Dry / Scaly: No Maceration: No Treatment Notes Wound #10 (Lower Leg) Wound Laterality: Left, Anterior Cleanser Soap and Water Discharge Instruction: May shower and wash wound with dial antibacterial soap and water prior to dressing change. Wound Cleanser Discharge Instruction: Cleanse the wound with wound cleanser prior to applying a clean dressing using gauze sponges, not tissue or cotton balls. Peri-Wound Care Sween Lotion (Moisturizing lotion) Discharge  Instruction: Apply moisturizing lotion as directed Topical Gentamicin Discharge Instruction: As directed by physician Mupirocin Ointment Discharge Instruction: Apply Mupirocin (Bactroban) as instructed Primary Dressing Maxorb Extra Ag+ Alginate Dressing, 4x4.75 (in/in) Discharge Instruction: Apply to wound bed as instructed Oestreich, [name] ([id number]) [id number] Page 8 of 9 Secondary Dressing ABD Pad, 5x9 Discharge Instruction: Apply over primary dressing as directed. CarboFLEX Odor Control Dressing, 4x4 in Discharge Instruction: Apply over primary dressing as directed. Secured With Compression Wrap Urgo K2, (equivalent to a 4 layer) two layer compression system, regular Discharge Instruction: Apply Urgo K2 as directed (alternative to 4 layer compression). Compression Stockings Add-Ons Electronic Signature(s) Signed: 12/25/2022 4:35:22 PM By: Redmond Pulling RN, BSN Entered By: Redmond Pulling on 12/25/2022 14:32:56 -------------------------------------------------------------------------------- Wound Assessment Details Patient Name: Date of Service: CAMAURI, HONTS 12/25/2022 2:00 PM Medical Record Number: [id number] Patient Account Number: [id number] Date of Birth/Sex: Treating RN: Sep 24, 1950 (72 y.o. Cline Cools Primary Care Belkys Henault: Everrett Coombe Other Clinician: Referring Maryanna Stuber: Treating Maxx Calaway/Extender: Cheri Guppy in Treatment: 11 Wound Status Wound Number: 11 Primary Lymphedema Etiology: Wound Location: Right, Lateral Lower Leg Wound Open Wounding Event: Blister Status: Date Acquired: 12/10/2022 Comorbid Anemia, Lymphedema, Congestive Heart Failure, Deep Vein Weeks Of Treatment: 2 History: Thrombosis, Type II Diabetes, Rheumatoid Arthritis Clustered Wound: No Photos Wound Measurements Length: (cm) Width: (cm) Depth: (cm) Area: (cm) Volume: (cm) 0 % Reduction in Area: 100% 0 % Reduction in Volume: 100% 0 Epithelialization: Small (1-33%) 0 Tunneling: No 0 Undermining: No Wound Description Classification: Full Thickness Without Exposed Support Wound Margin: Distinct, outline attached Exudate Amount: Medium Exudate Type: Serosanguineous Exudate Color: red, brown Harb, Menelik ([id number]) Wound Bed Granulation Amount: Large (67-100%) Granulation Quality: Red Necrotic Amount: None Present (0%) Structures Foul Odor After Cleansing: No Slough/Fibrino No [id number].pdf Page 9 of 9 Exposed Structure Fascia Exposed: No Fat Layer (Subcutaneous Tissue) Exposed: Yes Tendon Exposed: No Muscle Exposed: No Joint Exposed: No Bone Exposed: No Periwound Skin Texture Texture Color No Abnormalities Noted: No No Abnormalities Noted: No Callus: No Atrophie Blanche: No Crepitus: No Cyanosis: No Excoriation: No Ecchymosis: No Induration: No Erythema: No Rash: No Hemosiderin Staining: No Scarring: No Mottled: No Pallor: No Moisture Rubor: No No Abnormalities Noted: No Dry / Scaly: No Temperature / Pain Maceration: No Temperature: No Abnormality Electronic Signature(s) Signed: 12/25/2022 4:35:22 PM By: Redmond Pulling RN, BSN Entered By: Redmond Pulling on 12/25/2022 14:33:29 -------------------------------------------------------------------------------- Vitals Details Patient Name: Date of Service: [name] 12/25/2022 2:00  PM Medical Record Number: [id number] Patient Account Number: [id number] Date of Birth/Sex: Treating RN: Apr 17, 1951 (72 y.o. Cline Cools Primary Care Kaylean Tupou: Everrett Coombe Other Clinician: Referring Phillip Maffei: Treating Kaveri Perras/Extender: Cheri Guppy in Treatment: 11 Vital Signs Time Taken: 14:14 Temperature (F): 98.6 Height (in): 68 Pulse (bpm): 96 Weight (lbs): 220 Respiratory Rate (breaths/min): 18 Body Mass Index (BMI): 33.4 Blood Pressure (mmHg): 182/70 Reference Range: 80 - 120 mg / dl Electronic Signature(s) Signed: 12/25/2022 4:35:22 PM By: Redmond Pulling RN, BSN Entered By: Redmond Pulling on 12/25/2022 14:15:23

## 2023-01-03 ENCOUNTER — Other Ambulatory Visit (HOSPITAL_COMMUNITY): Payer: Self-pay | Admitting: Internal Medicine

## 2023-01-03 ENCOUNTER — Ambulatory Visit (HOSPITAL_COMMUNITY)
Admission: RE | Admit: 2023-01-03 | Discharge: 2023-01-03 | Disposition: A | Discharge status: Home / Self Care | Payer: Medicare Other | Source: Ambulatory Visit | Attending: Internal Medicine | Admitting: Internal Medicine

## 2023-01-03 ENCOUNTER — Encounter (HOSPITAL_BASED_OUTPATIENT_CLINIC_OR_DEPARTMENT_OTHER): Payer: Medicare Other | Attending: Internal Medicine | Admitting: Internal Medicine

## 2023-01-03 DIAGNOSIS — M79661 Pain in right lower leg: Secondary | ICD-10-CM | POA: Insufficient documentation

## 2023-01-03 DIAGNOSIS — I87311 Chronic venous hypertension (idiopathic) with ulcer of right lower extremity: Secondary | ICD-10-CM | POA: Diagnosis not present

## 2023-01-03 DIAGNOSIS — S81801A Unspecified open wound, right lower leg, initial encounter: Secondary | ICD-10-CM | POA: Diagnosis not present

## 2023-01-03 DIAGNOSIS — S81802A Unspecified open wound, left lower leg, initial encounter: Secondary | ICD-10-CM

## 2023-01-03 DIAGNOSIS — M7989 Other specified soft tissue disorders: Secondary | ICD-10-CM | POA: Insufficient documentation

## 2023-01-03 DIAGNOSIS — I87312 Chronic venous hypertension (idiopathic) with ulcer of left lower extremity: Secondary | ICD-10-CM

## 2023-01-04 NOTE — Progress Notes (Addendum)
the right lower extremity. He has 1 remaining wound to the anterior aspect. He has no issues or complaints today. 8/2; patient presents for follow-up. We have been using silver alginate under Urgo K2 to the right lower extremity. His wounds have healed. He has zipper compression stockings to use. We have ordered him thigh-high Velcro compression wraps that are arriving in 2 weeks. 8/12; patient presents for follow-up. Unfortunately he has reopened wounds on both lower extremities. He had to stop his diuretic due to low blood pressure And now his lower extremities are more edematous.. He has received his thigh-high Velcro compression wraps and lymphedema pumps. 8/22; patient presents for follow-up. We have been using silver alginate under 4-layer compression to the lower extremities bilaterally. His wounds are smaller. He has no issues or complaints today. 8/27; patient presents for follow-up. We have been using silver alginate with antibiotic ointment under 4-layer equivalent compression wraps to the lower extremities bilaterally. The right lower extremity wound has healed. He still has a small open wound to the left lower extremity. He has his juxta fits with him today. This will be applied to the right leg. 9/5; patient presents for follow-up. He has been wearing juxta fits to the right lower extremity. Unfortunately he developed a blister with a new wound to the this leg. We have been using silver alginate with antibiotic ointment under 4-layer compression to the left lower extremity. The left lower extremity wound has healed. 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 He is on Eliquis and reports taking this daily. Electronic Signature(s) Signed: 01/03/2023 5:29:38 PM By: Geralyn Corwin DO Entered By: Geralyn Corwin on 01/03/2023  15:19:01 -------------------------------------------------------------------------------- Physical Exam Details Patient Name: Date of Service: ASKARI, ANSARI 01/03/2023 2:15 PM Medical Record Number: [id number] Patient Account Number: [id number] Date of Birth/Sex: Treating RN: 1951-04-13 (72 y.o. M) Primary Care Provider: Everrett Coombe Other Clinician: Referring Provider: Treating Provider/Extender: Cheri Guppy in Treatment: 13 Constitutional respirations regular, non-labored and within target range for patient.. Cardiovascular 2+ dorsalis pedis/posterior tibialis pulses. Psychiatric pleasant and cooperative. Notes T the left lower extremity there is epithelization to the previous wound site. Decent edema control. T the right lower extremity there is a small open wound with o o granulation tissue to the anterior aspect. 3+ pitting edema to the thigh. Calf tenderness. Electronic Signature(s) Signed: 01/03/2023 5:29:38 PM By: Geralyn Corwin DO Entered By: Geralyn Corwin on 01/03/2023 15:19:24 -------------------------------------------------------------------------------- Physician Orders Details Patient Name: Date of Service: DAVIE, SCARPULLA 01/03/2023 2:15 PM Medical Record Number: [id number] Patient Account Number: [id number] Date of Birth/Sex: Treating RN: 1951-04-30 (72 y.o. Tammy Sours Primary Care Provider: Everrett Coombe Other Clinician: Referring Provider: Treating Provider/Extender: Cheri Guppy in Treatment: 13 Verbal / Phone Orders: Dellie Catholic, [name] ([id number]) [id number] Page 3 of 9 Diagnosis Coding ICD-10 Coding Code Description I87.312 Chronic venous hypertension (idiopathic) with ulcer of left lower extremity I87.311 Chronic venous hypertension (idiopathic) with ulcer of right lower extremity S81.802A Unspecified open wound, left lower leg, initial encounter S81.801A Unspecified open  wound, right lower leg, initial encounter I89.0 Lymphedema, not elsewhere classified 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 - Dr. Mikey Bussing Thursday 12/25/2022 2pm (already scheduled) Return A ppointment in 2 weeks. - Dr. Mikey Bussing Thursday (please schedule for patient) Return A Other: - Bring in whole juxtafits for both legs bring in  weekly in case your wounds heal. You may wear the whole leg juxtalite both legs now apply in the morning and at night also use when you use the pumps as well. STAT DVT study to right leg related to pain, edema, redness, Bathing/ Shower/ Hygiene May shower with protection but do not get wound dressing(s) wet. Protect dressing(s) with water repellant cover (for example, large plastic bag) or a cast cover and may then take shower. Edema Control - Lymphedema / SCD / Other Bilateral Lower Extremities Lymphedema Pumps. Use Lymphedema pumps on leg(s) 2-3 times a day for 45-60 minutes. If wearing any wraps or hose, do not remove them. Continue exercising as instructed. Elevate legs to the level of the heart or above for 30 minutes daily and/or when sitting for 3-4 times a day throughout the day. Avoid standing for long periods of time. Patient to wear own compression stockings every day. Exercise regularly Moisturize legs daily. Compression stocking or Garment 30-40 mm/Hg pressure to: - thigh high compression stockings- Circaid Juxtafit whole leg both legs. Wear daily Wound Treatment Wound #12 - Lower Leg Wound Laterality: Right, Anterior Cleanser: Soap and Water 1 x Per Day/30 Days Discharge Instructions: May shower and wash wound with dial antibacterial soap and water prior to dressing change. Cleanser: Vashe 5.8 (oz) 1 x Per Day/30 Days Discharge Instructions: Cleanse the wound with Vashe prior to applying a clean dressing using gauze sponges, not tissue or cotton balls. Peri-Wound Care:  Skin Prep 1 x Per Day/30 Days Discharge Instructions: Use skin prep as directed Prim Dressing: Maxorb Extra Ag+ Alginate Dressing, 2x2 (in/in) 1 x Per Day/30 Days ary Discharge Instructions: Apply to wound bed as instructed Secondary Dressing: Zetuvit Plus Silicone Border Dressing 4x4 (in/in) 1 x Per Day/30 Days Discharge Instructions: Apply silicone border over primary dressing as directed. [employer] DVT study right leg - STAT DVT study to right leg related to pain, edema, redness to rule out a DVT - (ICD10 I87.311 - Chronic venous hypertension . (idiopathic) with ulcer of right lower extremity) Electronic Signature(s) Signed: 01/03/2023 5:29:38 PM By: Geralyn Corwin DO Entered By: Geralyn Corwin on 01/03/2023 15:19:33 Prescription 01/03/2023 -------------------------------------------------------------------------------- Glenard Haring DO Patient Name: Provider: Apr 11, 1951 [id number] Date of Birth: NPI#: [id number] Sex: DEA #: [phone] Phone #: License #: [name] ([id number]) [id number] Page 4 of 9 UPN: Patient Address: [address] Eligha Bridegroom [facility] [place], [place] [address] [address] [place], [place] [address] [phone] Allergies benazepril; gabapentin; Hyrocodone; Shellfish Containing Products Provider's Orders DVT study right leg - ICD10: I87.311 - STAT DVT study to right leg related to pain, edema, redness to rule out a DVT. Hand Signature: Date(s): Electronic Signature(s) Signed: 01/03/2023 5:29:38 PM By: Geralyn Corwin DO Entered By: Geralyn Corwin on 01/03/2023 15:19:33 -------------------------------------------------------------------------------- Problem List Details Patient Name: Date of Service: KRISTJAN, SAYED 01/03/2023 2:15 PM Medical Record Number: [id number] Patient Account Number: [id number] Date of Birth/Sex: Treating RN: [birth year] (72  y.o. Tammy Sours Primary Care Provider: Everrett Coombe Other Clinician: Referring Provider: Treating Provider/Extender: Cheri Guppy in Treatment: 13 Active Problems ICD-10 Encounter Code Description Active Date MDM Diagnosis I87.312 Chronic venous hypertension (idiopathic) with ulcer of left lower extremity 11/05/2022 No Yes I87.311 Chronic venous hypertension (idiopathic) with ulcer of right lower extremity 11/19/2022 No Yes S81.802A Unspecified open wound, left lower leg, initial encounter 11/05/2022 No Yes S81.801A Unspecified open wound, right lower leg, initial encounter 12/10/2022  JERMAYNE, GALIN ([id number]) [id number] Page 1 of 9 Visit Report for 01/03/2023 Chief Complaint Document Details Patient Name: Date of Service: SHED, LIPP 01/03/2023 2:15 PM Medical Record Number: [id number] Patient Account Number: [id number] Date of Birth/Sex: Treating RN: [birth year] (72 y.o. M) Primary Care Provider: Everrett Coombe Other Clinician: Referring Provider: Treating Provider/Extender: Cheri Guppy in Treatment: 13 Information Obtained from: Patient Chief Complaint 10/04/2022; patient is here for review of multiple wounds Electronic Signature(s) Signed: 01/03/2023 5:29:38 PM By: Geralyn Corwin DO Entered By: Geralyn Corwin on 01/03/2023 15:14:04 -------------------------------------------------------------------------------- HPI Details Patient Name: Date of Service: [name] 01/03/2023 2:15 PM Medical Record Number: [id number] Patient Account Number: [id number] Date of Birth/Sex: Treating RN: 09/07/1950 (72 y.o. M) Primary Care Provider: Everrett Coombe Other Clinician: Referring Provider: Treating Provider/Extender: Cheri Guppy in Treatment: 13 History of Present Illness HPI Description: ADMISSION 10/04/2022 This is a 72 year old man who is quite disabled because of some form of myopathy. He follows for this with neurology. He has had biopsies that showed necrosis without inflammation. 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 In any case he is reasonably immobile at home. He has this apparatus to help him transfer. Because of difficulties getting him upstairs he has been sleeping in a recliner on the main floor. He also has a history of lymphedema and I see he was followed by PT for a period of time. He has a history of combined congestive heart failure but his last echo showed an EF of 60 to  65%. He has developed a wound on the right lateral leg over the last month or so. He also has a smaller wound on the right anterior lower leg. He is having problems with his left second toe and he has bilateral stage II ulcers over his ischial tuberosity. The patient is complaining of oral pain under his tongue. He has bilateral heel pain and increasing edema with weight gain over the past week or 2 Past medical history includes myopathy/myositis question inclusion body myositis, rheumatoid arthritis, coronary artery disease, massive pulmonary embolism, type 2 diabetes, lymphedema, combined congestive heart failure, bilateral foot drop PTSD ABIs in our clinic were 1.22 on the right 1.27 on the left 6/17; patient presents for follow-up. He has been using silver alginate to the bilateral buttocks wounds. These have healed. We have also been using silver alginate to the right lower extremity under compression therapy. He is tolerated this well. The wounds are smaller. 6/25; patient presents for follow-up. We have been using silver alginate to the right lower extremity under compression therapy. The anterior leg wound has healed. The lateral right leg wound is smaller. 7/8; patient presents for follow-up. We have been using silver alginate under compression therapy to the right lower extremity wound and this is healed. Unfortunately he has developed a blister to the left lower extremity. 7/15; patient presents for follow-up. We have been using silver alginate under 4-layer compression to the left lower extremity. His wound has healed here. Unfortunately he has developed blistering to the right lower extremity and has open wounds despite using compression therapy daily here. MICKENZIE, SCHNEIDER ([id number]) [id number] Page 2 of 9 7/22; patient presents for follow-up. We have been using silver alginate under 4-layer compression to the right lower extremity. He has been using his  zipper compression wrap to the left lower extremity daily. 2 wounds have healed on  the right lower extremity. He has 1 remaining wound to the anterior aspect. He has no issues or complaints today. 8/2; patient presents for follow-up. We have been using silver alginate under Urgo K2 to the right lower extremity. His wounds have healed. He has zipper compression stockings to use. We have ordered him thigh-high Velcro compression wraps that are arriving in 2 weeks. 8/12; patient presents for follow-up. Unfortunately he has reopened wounds on both lower extremities. He had to stop his diuretic due to low blood pressure And now his lower extremities are more edematous.. He has received his thigh-high Velcro compression wraps and lymphedema pumps. 8/22; patient presents for follow-up. We have been using silver alginate under 4-layer compression to the lower extremities bilaterally. His wounds are smaller. He has no issues or complaints today. 8/27; patient presents for follow-up. We have been using silver alginate with antibiotic ointment under 4-layer equivalent compression wraps to the lower extremities bilaterally. The right lower extremity wound has healed. He still has a small open wound to the left lower extremity. He has his juxta fits with him today. This will be applied to the right leg. 9/5; patient presents for follow-up. He has been wearing juxta fits to the right lower extremity. Unfortunately he developed a blister with a new wound to the this leg. We have been using silver alginate with antibiotic ointment under 4-layer compression to the left lower extremity. The left lower extremity wound has healed. 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 He is on Eliquis and reports taking this daily. Electronic Signature(s) Signed: 01/03/2023 5:29:38 PM By: Geralyn Corwin DO Entered By: Geralyn Corwin on 01/03/2023  15:19:01 -------------------------------------------------------------------------------- Physical Exam Details Patient Name: Date of Service: ASKARI, ANSARI 01/03/2023 2:15 PM Medical Record Number: [id number] Patient Account Number: [id number] Date of Birth/Sex: Treating RN: 1951-04-13 (72 y.o. M) Primary Care Provider: Everrett Coombe Other Clinician: Referring Provider: Treating Provider/Extender: Cheri Guppy in Treatment: 13 Constitutional respirations regular, non-labored and within target range for patient.. Cardiovascular 2+ dorsalis pedis/posterior tibialis pulses. Psychiatric pleasant and cooperative. Notes T the left lower extremity there is epithelization to the previous wound site. Decent edema control. T the right lower extremity there is a small open wound with o o granulation tissue to the anterior aspect. 3+ pitting edema to the thigh. Calf tenderness. Electronic Signature(s) Signed: 01/03/2023 5:29:38 PM By: Geralyn Corwin DO Entered By: Geralyn Corwin on 01/03/2023 15:19:24 -------------------------------------------------------------------------------- Physician Orders Details Patient Name: Date of Service: DAVIE, SCARPULLA 01/03/2023 2:15 PM Medical Record Number: [id number] Patient Account Number: [id number] Date of Birth/Sex: Treating RN: 1951-04-30 (72 y.o. Tammy Sours Primary Care Provider: Everrett Coombe Other Clinician: Referring Provider: Treating Provider/Extender: Cheri Guppy in Treatment: 13 Verbal / Phone Orders: Dellie Catholic, [name] ([id number]) [id number] Page 3 of 9 Diagnosis Coding ICD-10 Coding Code Description I87.312 Chronic venous hypertension (idiopathic) with ulcer of left lower extremity I87.311 Chronic venous hypertension (idiopathic) with ulcer of right lower extremity S81.802A Unspecified open wound, left lower leg, initial encounter S81.801A Unspecified open  wound, right lower leg, initial encounter I89.0 Lymphedema, not elsewhere classified 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 - Dr. Mikey Bussing Thursday 12/25/2022 2pm (already scheduled) Return A ppointment in 2 weeks. - Dr. Mikey Bussing Thursday (please schedule for patient) Return A Other: - Bring in whole juxtafits for both legs bring in  the right lower extremity. He has 1 remaining wound to the anterior aspect. He has no issues or complaints today. 8/2; patient presents for follow-up. We have been using silver alginate under Urgo K2 to the right lower extremity. His wounds have healed. He has zipper compression stockings to use. We have ordered him thigh-high Velcro compression wraps that are arriving in 2 weeks. 8/12; patient presents for follow-up. Unfortunately he has reopened wounds on both lower extremities. He had to stop his diuretic due to low blood pressure And now his lower extremities are more edematous.. He has received his thigh-high Velcro compression wraps and lymphedema pumps. 8/22; patient presents for follow-up. We have been using silver alginate under 4-layer compression to the lower extremities bilaterally. His wounds are smaller. He has no issues or complaints today. 8/27; patient presents for follow-up. We have been using silver alginate with antibiotic ointment under 4-layer equivalent compression wraps to the lower extremities bilaterally. The right lower extremity wound has healed. He still has a small open wound to the left lower extremity. He has his juxta fits with him today. This will be applied to the right leg. 9/5; patient presents for follow-up. He has been wearing juxta fits to the right lower extremity. Unfortunately he developed a blister with a new wound to the this leg. We have been using silver alginate with antibiotic ointment under 4-layer compression to the left lower extremity. The left lower extremity wound has healed. 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 He is on Eliquis and reports taking this daily. Electronic Signature(s) Signed: 01/03/2023 5:29:38 PM By: Geralyn Corwin DO Entered By: Geralyn Corwin on 01/03/2023  15:19:01 -------------------------------------------------------------------------------- Physical Exam Details Patient Name: Date of Service: ASKARI, ANSARI 01/03/2023 2:15 PM Medical Record Number: [id number] Patient Account Number: [id number] Date of Birth/Sex: Treating RN: 1951-04-13 (72 y.o. M) Primary Care Provider: Everrett Coombe Other Clinician: Referring Provider: Treating Provider/Extender: Cheri Guppy in Treatment: 13 Constitutional respirations regular, non-labored and within target range for patient.. Cardiovascular 2+ dorsalis pedis/posterior tibialis pulses. Psychiatric pleasant and cooperative. Notes T the left lower extremity there is epithelization to the previous wound site. Decent edema control. T the right lower extremity there is a small open wound with o o granulation tissue to the anterior aspect. 3+ pitting edema to the thigh. Calf tenderness. Electronic Signature(s) Signed: 01/03/2023 5:29:38 PM By: Geralyn Corwin DO Entered By: Geralyn Corwin on 01/03/2023 15:19:24 -------------------------------------------------------------------------------- Physician Orders Details Patient Name: Date of Service: DAVIE, SCARPULLA 01/03/2023 2:15 PM Medical Record Number: [id number] Patient Account Number: [id number] Date of Birth/Sex: Treating RN: 1951-04-30 (72 y.o. Tammy Sours Primary Care Provider: Everrett Coombe Other Clinician: Referring Provider: Treating Provider/Extender: Cheri Guppy in Treatment: 13 Verbal / Phone Orders: Dellie Catholic, [name] ([id number]) [id number] Page 3 of 9 Diagnosis Coding ICD-10 Coding Code Description I87.312 Chronic venous hypertension (idiopathic) with ulcer of left lower extremity I87.311 Chronic venous hypertension (idiopathic) with ulcer of right lower extremity S81.802A Unspecified open wound, left lower leg, initial encounter S81.801A Unspecified open  wound, right lower leg, initial encounter I89.0 Lymphedema, not elsewhere classified 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 - Dr. Mikey Bussing Thursday 12/25/2022 2pm (already scheduled) Return A ppointment in 2 weeks. - Dr. Mikey Bussing Thursday (please schedule for patient) Return A Other: - Bring in whole juxtafits for both legs bring in  weekly in case your wounds heal. You may wear the whole leg juxtalite both legs now apply in the morning and at night also use when you use the pumps as well. STAT DVT study to right leg related to pain, edema, redness, Bathing/ Shower/ Hygiene May shower with protection but do not get wound dressing(s) wet. Protect dressing(s) with water repellant cover (for example, large plastic bag) or a cast cover and may then take shower. Edema Control - Lymphedema / SCD / Other Bilateral Lower Extremities Lymphedema Pumps. Use Lymphedema pumps on leg(s) 2-3 times a day for 45-60 minutes. If wearing any wraps or hose, do not remove them. Continue exercising as instructed. Elevate legs to the level of the heart or above for 30 minutes daily and/or when sitting for 3-4 times a day throughout the day. Avoid standing for long periods of time. Patient to wear own compression stockings every day. Exercise regularly Moisturize legs daily. Compression stocking or Garment 30-40 mm/Hg pressure to: - thigh high compression stockings- Circaid Juxtafit whole leg both legs. Wear daily Wound Treatment Wound #12 - Lower Leg Wound Laterality: Right, Anterior Cleanser: Soap and Water 1 x Per Day/30 Days Discharge Instructions: May shower and wash wound with dial antibacterial soap and water prior to dressing change. Cleanser: Vashe 5.8 (oz) 1 x Per Day/30 Days Discharge Instructions: Cleanse the wound with Vashe prior to applying a clean dressing using gauze sponges, not tissue or cotton balls. Peri-Wound Care:  Skin Prep 1 x Per Day/30 Days Discharge Instructions: Use skin prep as directed Prim Dressing: Maxorb Extra Ag+ Alginate Dressing, 2x2 (in/in) 1 x Per Day/30 Days ary Discharge Instructions: Apply to wound bed as instructed Secondary Dressing: Zetuvit Plus Silicone Border Dressing 4x4 (in/in) 1 x Per Day/30 Days Discharge Instructions: Apply silicone border over primary dressing as directed. [employer] Services DVT study right leg - STAT DVT study to right leg related to pain, edema, redness to rule out a DVT - (ICD10 I87.311 - Chronic venous hypertension . (idiopathic) with ulcer of right lower extremity) Electronic Signature(s) Signed: 01/03/2023 5:29:38 PM By: Geralyn Corwin DO Entered By: Geralyn Corwin on 01/03/2023 15:19:33 Prescription 01/03/2023 -------------------------------------------------------------------------------- Glenard Haring DO Patient Name: Provider: Apr 11, 1951 [id number] Date of Birth: NPI#: [id number] Sex: DEA #: [phone] Phone #: License #: [name] ([id number]) [id number] Page 4 of 9 UPN: Patient Address: [address] Eligha Bridegroom [facility] [place], [place] [address] [address] [place], [place] [address] [phone] Allergies benazepril; gabapentin; Hyrocodone; Shellfish Containing Products Provider's Orders DVT study right leg - ICD10: I87.311 - STAT DVT study to right leg related to pain, edema, redness to rule out a DVT. Hand Signature: Date(s): Electronic Signature(s) Signed: 01/03/2023 5:29:38 PM By: Geralyn Corwin DO Entered By: Geralyn Corwin on 01/03/2023 15:19:33 -------------------------------------------------------------------------------- Problem List Details Patient Name: Date of Service: KRISTJAN, SAYED 01/03/2023 2:15 PM Medical Record Number: [id number] Patient Account Number: [id number] Date of Birth/Sex: Treating RN: [birth year] (72  y.o. Tammy Sours Primary Care Provider: Everrett Coombe Other Clinician: Referring Provider: Treating Provider/Extender: Cheri Guppy in Treatment: 13 Active Problems ICD-10 Encounter Code Description Active Date MDM Diagnosis I87.312 Chronic venous hypertension (idiopathic) with ulcer of left lower extremity 11/05/2022 No Yes I87.311 Chronic venous hypertension (idiopathic) with ulcer of right lower extremity 11/19/2022 No Yes S81.802A Unspecified open wound, left lower leg, initial encounter 11/05/2022 No Yes S81.801A Unspecified open wound, right lower leg, initial encounter 12/10/2022  the right lower extremity. He has 1 remaining wound to the anterior aspect. He has no issues or complaints today. 8/2; patient presents for follow-up. We have been using silver alginate under Urgo K2 to the right lower extremity. His wounds have healed. He has zipper compression stockings to use. We have ordered him thigh-high Velcro compression wraps that are arriving in 2 weeks. 8/12; patient presents for follow-up. Unfortunately he has reopened wounds on both lower extremities. He had to stop his diuretic due to low blood pressure And now his lower extremities are more edematous.. He has received his thigh-high Velcro compression wraps and lymphedema pumps. 8/22; patient presents for follow-up. We have been using silver alginate under 4-layer compression to the lower extremities bilaterally. His wounds are smaller. He has no issues or complaints today. 8/27; patient presents for follow-up. We have been using silver alginate with antibiotic ointment under 4-layer equivalent compression wraps to the lower extremities bilaterally. The right lower extremity wound has healed. He still has a small open wound to the left lower extremity. He has his juxta fits with him today. This will be applied to the right leg. 9/5; patient presents for follow-up. He has been wearing juxta fits to the right lower extremity. Unfortunately he developed a blister with a new wound to the this leg. We have been using silver alginate with antibiotic ointment under 4-layer compression to the left lower extremity. The left lower extremity wound has healed. 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 He is on Eliquis and reports taking this daily. Electronic Signature(s) Signed: 01/03/2023 5:29:38 PM By: Geralyn Corwin DO Entered By: Geralyn Corwin on 01/03/2023  15:19:01 -------------------------------------------------------------------------------- Physical Exam Details Patient Name: Date of Service: ASKARI, ANSARI 01/03/2023 2:15 PM Medical Record Number: [id number] Patient Account Number: [id number] Date of Birth/Sex: Treating RN: 1951-04-13 (72 y.o. M) Primary Care Provider: Everrett Coombe Other Clinician: Referring Provider: Treating Provider/Extender: Cheri Guppy in Treatment: 13 Constitutional respirations regular, non-labored and within target range for patient.. Cardiovascular 2+ dorsalis pedis/posterior tibialis pulses. Psychiatric pleasant and cooperative. Notes T the left lower extremity there is epithelization to the previous wound site. Decent edema control. T the right lower extremity there is a small open wound with o o granulation tissue to the anterior aspect. 3+ pitting edema to the thigh. Calf tenderness. Electronic Signature(s) Signed: 01/03/2023 5:29:38 PM By: Geralyn Corwin DO Entered By: Geralyn Corwin on 01/03/2023 15:19:24 -------------------------------------------------------------------------------- Physician Orders Details Patient Name: Date of Service: DAVIE, SCARPULLA 01/03/2023 2:15 PM Medical Record Number: [id number] Patient Account Number: [id number] Date of Birth/Sex: Treating RN: 1951-04-30 (72 y.o. Tammy Sours Primary Care Provider: Everrett Coombe Other Clinician: Referring Provider: Treating Provider/Extender: Cheri Guppy in Treatment: 13 Verbal / Phone Orders: Dellie Catholic, [name] ([id number]) [id number] Page 3 of 9 Diagnosis Coding ICD-10 Coding Code Description I87.312 Chronic venous hypertension (idiopathic) with ulcer of left lower extremity I87.311 Chronic venous hypertension (idiopathic) with ulcer of right lower extremity S81.802A Unspecified open wound, left lower leg, initial encounter S81.801A Unspecified open  wound, right lower leg, initial encounter I89.0 Lymphedema, not elsewhere classified 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 - Dr. Mikey Bussing Thursday 12/25/2022 2pm (already scheduled) Return A ppointment in 2 weeks. - Dr. Mikey Bussing Thursday (please schedule for patient) Return A Other: - Bring in whole juxtafits for both legs bring in  the right lower extremity. He has 1 remaining wound to the anterior aspect. He has no issues or complaints today. 8/2; patient presents for follow-up. We have been using silver alginate under Urgo K2 to the right lower extremity. His wounds have healed. He has zipper compression stockings to use. We have ordered him thigh-high Velcro compression wraps that are arriving in 2 weeks. 8/12; patient presents for follow-up. Unfortunately he has reopened wounds on both lower extremities. He had to stop his diuretic due to low blood pressure And now his lower extremities are more edematous.. He has received his thigh-high Velcro compression wraps and lymphedema pumps. 8/22; patient presents for follow-up. We have been using silver alginate under 4-layer compression to the lower extremities bilaterally. His wounds are smaller. He has no issues or complaints today. 8/27; patient presents for follow-up. We have been using silver alginate with antibiotic ointment under 4-layer equivalent compression wraps to the lower extremities bilaterally. The right lower extremity wound has healed. He still has a small open wound to the left lower extremity. He has his juxta fits with him today. This will be applied to the right leg. 9/5; patient presents for follow-up. He has been wearing juxta fits to the right lower extremity. Unfortunately he developed a blister with a new wound to the this leg. We have been using silver alginate with antibiotic ointment under 4-layer compression to the left lower extremity. The left lower extremity wound has healed. 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 He is on Eliquis and reports taking this daily. Electronic Signature(s) Signed: 01/03/2023 5:29:38 PM By: Geralyn Corwin DO Entered By: Geralyn Corwin on 01/03/2023  15:19:01 -------------------------------------------------------------------------------- Physical Exam Details Patient Name: Date of Service: ASKARI, ANSARI 01/03/2023 2:15 PM Medical Record Number: [id number] Patient Account Number: [id number] Date of Birth/Sex: Treating RN: 1951-04-13 (72 y.o. M) Primary Care Provider: Everrett Coombe Other Clinician: Referring Provider: Treating Provider/Extender: Cheri Guppy in Treatment: 13 Constitutional respirations regular, non-labored and within target range for patient.. Cardiovascular 2+ dorsalis pedis/posterior tibialis pulses. Psychiatric pleasant and cooperative. Notes T the left lower extremity there is epithelization to the previous wound site. Decent edema control. T the right lower extremity there is a small open wound with o o granulation tissue to the anterior aspect. 3+ pitting edema to the thigh. Calf tenderness. Electronic Signature(s) Signed: 01/03/2023 5:29:38 PM By: Geralyn Corwin DO Entered By: Geralyn Corwin on 01/03/2023 15:19:24 -------------------------------------------------------------------------------- Physician Orders Details Patient Name: Date of Service: DAVIE, SCARPULLA 01/03/2023 2:15 PM Medical Record Number: [id number] Patient Account Number: [id number] Date of Birth/Sex: Treating RN: 1951-04-30 (72 y.o. Tammy Sours Primary Care Provider: Everrett Coombe Other Clinician: Referring Provider: Treating Provider/Extender: Cheri Guppy in Treatment: 13 Verbal / Phone Orders: Dellie Catholic, [name] ([id number]) [id number] Page 3 of 9 Diagnosis Coding ICD-10 Coding Code Description I87.312 Chronic venous hypertension (idiopathic) with ulcer of left lower extremity I87.311 Chronic venous hypertension (idiopathic) with ulcer of right lower extremity S81.802A Unspecified open wound, left lower leg, initial encounter S81.801A Unspecified open  wound, right lower leg, initial encounter I89.0 Lymphedema, not elsewhere classified 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 - Dr. Mikey Bussing Thursday 12/25/2022 2pm (already scheduled) Return A ppointment in 2 weeks. - Dr. Mikey Bussing Thursday (please schedule for patient) Return A Other: - Bring in whole juxtafits for both legs bring in

## 2023-01-04 NOTE — Progress Notes (Addendum)
SUTTON, HIRSCH ([id number]) [id number] Page 1 of 7 Visit Report for 01/03/2023 Arrival Information Details Patient Name: Date of Service: ODIS, [name] 01/03/2023 2:15 PM Medical Record Number: [id number] Patient Account Number: [id number] Date of Birth/Sex: Treating RN: 05-Apr-1951 (71 y.o. Cline Cools Primary Care Kendyl Festa: Everrett Coombe Other Clinician: Referring Brylen Wagar: Treating Ricquel Foulk/Extender: Cheri Guppy in Treatment: 13 Visit Information History Since Last Visit Added or deleted any medications: No Patient Arrived: Wheel Chair Any new allergies or adverse reactions: No Arrival Time: 14:28 Had a fall or experienced change in No Accompanied By: Wife activities of daily living that may affect Transfer Assistance: None risk of falls: Patient Identification Verified: Yes Signs or symptoms of abuse/neglect since last visito No Secondary Verification Process Completed: Yes Hospitalized since last visit: No Patient Requires Transmission-Based Precautions: No Implantable device outside of the clinic excluding No Patient Has Alerts: Yes cellular tissue based products placed in the center Patient Alerts: ELIQUIS since last visit: Has Dressing in Place as Prescribed: Yes Has Compression in Place as Prescribed: Yes Pain Present Now: No Electronic Signature(s) Signed: 01/03/2023 3:54:16 PM By: Redmond Pulling RN, BSN Entered By: Redmond Pulling on 01/03/2023 11:29:06 -------------------------------------------------------------------------------- Lower Extremity Assessment Details Patient Name: Date of Service: [name], [name] 01/03/2023 2:15 PM Medical Record Number: [id number] Patient Account Number: [id number] Date of Birth/Sex: Treating RN: [birth year] (72 y.o. Cline Cools Primary Care Erastus Bartolomei: Everrett Coombe Other Clinician: Referring Jatoria Kneeland: Treating Maybel Dambrosio/Extender: Cheri Guppy in Treatment:  13 Edema Assessment Assessed: [Left: No] [name]: No] Edema: [Left: Yes] [Right: Yes] Calf Left: Right: Point of Measurement: 37 cm From Medial Instep 46.5 cm 48 cm Ankle Left: Right: Point of Measurement: 10 cm From Medial Instep 25 cm 29 cm Vascular Assessment Pulses: Dorsalis Pedis Palpable: [Left:Yes] [Right:Yes] Stalnaker, Mamie ([id number]) [Right:[id number] Page 2 of 7] Extremity colors, hair growth, and conditions: Extremity Color: [Left:Normal] [Right:Normal] Hair Growth on Extremity: [Left:No] [Right:No] Temperature of Extremity: [Left:Warm] [Right:Warm] Capillary Refill: [Left:< 3 seconds] [Right:< 3 seconds] Dependent Rubor: [Left:No No] [Right:No No] Electronic Signature(s) Signed: 01/03/2023 3:54:16 PM By: Redmond Pulling RN, BSN Entered By: Redmond Pulling on 01/03/2023 11:40:26 -------------------------------------------------------------------------------- Multi Wound Chart Details Patient Name: Date of Service: [name] 01/03/2023 2:15 PM Medical Record Number: [id number] Patient Account Number: [id number] Date of Birth/Sex: Treating RN: [birth year] (72 y.o. M) Primary Care Caylan Chenard: Everrett Coombe Other Clinician: Referring Mailey Landstrom: Treating Fayola Meckes/Extender: Cheri Guppy in Treatment: 13 Vital Signs Height(in): 68 Pulse(bpm): 94 Weight(lbs): 220 Blood Pressure(mmHg): 145/76 Body Mass Index(BMI): 33.4 Temperature(F): 98.5 Respiratory Rate(breaths/min): 18 [10:Photos:] [N/A:N/A] Left, Anterior Lower Leg Right, Anterior Lower Leg N/A Wound Location: Blister Blister N/A Wounding Event: Lymphedema Venous Leg Ulcer N/A Primary Etiology: Anemia, Lymphedema, Congestive Anemia, Lymphedema, Congestive N/A Comorbid History: Heart Failure, Deep Vein Thrombosis, Heart Failure, Deep Vein Thrombosis, Type II Diabetes, Rheumatoid Arthritis Type II Diabetes, Rheumatoid Arthritis 12/10/2022 01/03/2023 N/A Date  Acquired: 3 0 N/A Weeks of Treatment: Open Open N/A Wound Status: No No N/A Wound Recurrence: 0x0x0 0.7x1.2x0.1 N/A Measurements L x W x D (cm) 0 0.66 N/A A (cm) : rea 0 0.066 N/A Volume (cm) : 100.00% N/A N/A % Reduction in Area: 100.00% N/A N/A % Reduction in Volume: Full Thickness Without Exposed Full Thickness Without Exposed N/A Classification: Support Structures Support Structures None Present Medium N/A Exudate Amount: N/A Serosanguineous N/A Exudate Type: N/A red, brown N/A Exudate Color: Distinct, outline attached N/A N/A Wound Margin: None Present (0%) Large (67-100%) N/A Granulation  Amount: N/A Red N/A Granulation Quality: None Present (0%) Small (1-33%) N/A Necrotic Amount: Fat Layer (Subcutaneous Tissue): Yes Fat Layer (Subcutaneous Tissue): Yes N/A Exposed Structures: Fascia: No Fascia: No Tendon: No Tendon: No Muscle: No Muscle: No Joint: No Joint: No Bone: No Bone: No Small (1-33%) None N/A Epithelialization: Prieur, Rodell ([id number]) [id number] Page 3 of 7 Excoriation: No N/A Periwound Skin Texture: Induration: No Callus: No Crepitus: No Rash: No Scarring: No Maceration: No N/A Periwound Skin Moisture: Dry/Scaly: No Atrophie Blanche: No N/A Periwound Skin Color: Cyanosis: No Ecchymosis: No Erythema: No Hemosiderin Staining: No Mottled: No Pallor: No Rubor: No Treatment Notes Electronic Signature(s) Signed: 01/03/2023 5:29:38 PM By: Geralyn Corwin DO Entered By: Geralyn Corwin on 01/03/2023 12:13:54 -------------------------------------------------------------------------------- Multi-Disciplinary Care Plan Details Patient Name: Date of Service: LUMAN, HOLWAY 01/03/2023 2:15 PM Medical Record Number: [id number] Patient Account Number: [id number] Date of Birth/Sex: Treating RN: [birth year] (72 y.o. Tammy Sours Primary Care Anorah Trias: Everrett Coombe Other Clinician: Referring Djuna Frechette: Treating  Thatiana Renbarger/Extender: Cheri Guppy in Treatment: 13 Active Inactive Electronic Signature(s) Signed: 01/28/2023 8:38:05 AM By: Shawn Stall RN, BSN Previous Signature: 01/03/2023 5:00:34 PM Version By: Shawn Stall RN, BSN Entered By: Shawn Stall on 01/11/2023 09:31:51 -------------------------------------------------------------------------------- Pain Assessment Details Patient Name: Date of Service: TRICE, ASPINALL 01/03/2023 2:15 PM Medical Record Number: [id number] Patient Account Number: [id number] Date of Birth/Sex: Treating RN: 1950/08/16 (72 y.o. Cline Cools Primary Care Edras Wilford: Everrett Coombe Other Clinician: Referring Peterson Mathey: Treating Jalexus Brett/Extender: Cheri Guppy in Treatment: 13 Active Problems Location of Pain Severity and Description of Pain Patient Has Paino No Site Locations [place], [place] ([id number]) [id number] Page 4 of 7 Pain Management and Medication Current Pain Management: Electronic Signature(s) Signed: 01/03/2023 3:54:16 PM By: Redmond Pulling RN, BSN Entered By: Redmond Pulling on 01/03/2023 11:30:34 -------------------------------------------------------------------------------- Patient/Caregiver Education Details Patient Name: Date of Service: [name] 9/5/2024andnbsp2:15 PM Medical Record Number: [id number] Patient Account Number: [id number] Date of Birth/Gender: Treating RN: 09-26-1950 (72 y.o. Tammy Sours Primary Care Physician: Everrett Coombe Other Clinician: Referring Physician: Treating Physician/Extender: Cheri Guppy in Treatment: 13 Education Assessment Education Provided To: Patient Education Topics Provided Wound/Skin Impairment: Handouts: Caring for Your Ulcer Methods: Explain/Verbal Responses: Reinforcements needed Electronic Signature(s) Signed: 01/03/2023 5:00:34 PM By: Shawn Stall RN, BSN Entered By: Shawn Stall on  01/03/2023 11:54:04 -------------------------------------------------------------------------------- Wound Assessment Details Patient Name: Date of Service: JANDEL, PATRIARCA 01/03/2023 2:15 PM Medical Record Number: [id number] Patient Account Number: [id number] Date of Birth/Sex: Treating RN: Mar 07, 1951 (72 y.o. Cline Cools Primary Care Arley Garant: Everrett Coombe Other Clinician: [name] ([id number]) [id number] Page 5 of 7 Referring Rena Sweeden: Treating Kashaun Bebo/Extender: Cheri Guppy in Treatment: 13 Wound Status Wound Number: 10 Primary Lymphedema Etiology: Wound Location: Left, Anterior Lower Leg Wound Open Wounding Event: Blister Status: Date Acquired: 12/10/2022 Comorbid Anemia, Lymphedema, Congestive Heart Failure, Deep Vein Weeks Of Treatment: 3 History: Thrombosis, Type II Diabetes, Rheumatoid Arthritis Clustered Wound: No Photos Wound Measurements Length: (cm) Width: (cm) Depth: (cm) Area: (cm) Volume: (cm) 0 % Reduction in Area: 100% 0 % Reduction in Volume: 100% 0 Epithelialization: Small (1-33%) 0 Tunneling: No 0 Undermining: No Wound Description Classification: Full Thickness Without Exposed Suppor Wound Margin: Distinct, outline attached Exudate Amount: None Present t Structures Foul Odor After Cleansing: No Slough/Fibrino No Wound Bed Granulation Amount: None Present (0%) Exposed Structure Necrotic Amount: None Present (0%) Fascia Exposed: No Fat Layer (Subcutaneous Tissue) Exposed: Yes Tendon Exposed: No Muscle Exposed:  No Joint Exposed: No Bone Exposed: No Periwound Skin Texture Texture Color No Abnormalities Noted: No No Abnormalities Noted: No Callus: No Atrophie Blanche: No Crepitus: No Cyanosis: No Excoriation: No Ecchymosis: No Induration: No Erythema: No Rash: No Hemosiderin Staining: No Scarring: No Mottled: No Pallor: No Moisture Rubor: No No Abnormalities Noted: No Dry /  Scaly: No Maceration: No Electronic Signature(s) Signed: 01/03/2023 3:54:16 PM By: Redmond Pulling RN, BSN Entered By: Redmond Pulling on 01/03/2023 11:41:25 Clagg, Maximos ([id number]) [id number] Page 6 of 7 -------------------------------------------------------------------------------- Wound Assessment Details Patient Name: Date of Service: [name], [name] 01/03/2023 2:15 PM Medical Record Number: [id number] Patient Account Number: [id number] Date of Birth/Sex: Treating RN: Jul 06, 1950 (72 y.o. Cline Cools Primary Care Charlee Squibb: Everrett Coombe Other Clinician: Referring Wendy Hoback: Treating Jacquelyn Shadrick/Extender: Cheri Guppy in Treatment: 13 Wound Status Wound Number: 12 Primary Venous Leg Ulcer Etiology: Wound Location: Right, Anterior Lower Leg Wound Open Wounding Event: Blister Status: Date Acquired: 01/03/2023 Comorbid Anemia, Lymphedema, Congestive Heart Failure, Deep Vein Weeks Of Treatment: 0 History: Thrombosis, Type II Diabetes, Rheumatoid Arthritis Clustered Wound: No Photos Wound Measurements Length: (cm) 0.7 Width: (cm) 1.2 Depth: (cm) 0.1 Area: (cm) 0.66 Volume: (cm) 0.066 % Reduction in Area: % Reduction in Volume: Epithelialization: None Tunneling: No Undermining: No Wound Description Classification: Full Thickness Without Exposed Suppor Exudate Amount: Medium Exudate Type: Serosanguineous Exudate Color: red, brown t Structures Foul Odor After Cleansing: No Slough/Fibrino Yes Wound Bed Granulation Amount: Large (67-100%) Exposed Structure Granulation Quality: Red Fascia Exposed: No Necrotic Amount: Small (1-33%) Fat Layer (Subcutaneous Tissue) Exposed: Yes Necrotic Quality: Adherent Slough Tendon Exposed: No Muscle Exposed: No Joint Exposed: No Bone Exposed: No Periwound Skin Texture Texture Color No Abnormalities Noted: No No Abnormalities Noted: No Moisture No Abnormalities Noted: No Electronic  Signature(s) Signed: 01/03/2023 3:54:16 PM By: Redmond Pulling RN, BSN Entered By: Redmond Pulling on 01/03/2023 11:43:27 Compean, Edwen ([id number]) [id number] Page 7 of 7 -------------------------------------------------------------------------------- Vitals Details Patient Name: Date of Service: [name], [name] 01/03/2023 2:15 PM Medical Record Number: [id number] Patient Account Number: [id number] Date of Birth/Sex: Treating RN: 1951-01-22 (72 y.o. Cline Cools Primary Care Dickie Labarre: Everrett Coombe Other Clinician: Referring Gevorg Brum: Treating Jamarie Mussa/Extender: Cheri Guppy in Treatment: 13 Vital Signs Time Taken: 14:29 Temperature (F): 98.5 Height (in): 68 Pulse (bpm): 94 Weight (lbs): 220 Respiratory Rate (breaths/min): 18 Body Mass Index (BMI): 33.4 Blood Pressure (mmHg): 145/76 Reference Range: 80 - 120 mg / dl Electronic Signature(s) Signed: 01/03/2023 3:54:16 PM By: Redmond Pulling RN, BSN Entered By: Redmond Pulling on 01/03/2023 11:30:26

## 2023-01-07 DIAGNOSIS — H401134 Primary open-angle glaucoma, bilateral, indeterminate stage: Secondary | ICD-10-CM | POA: Diagnosis not present

## 2023-01-08 ENCOUNTER — Other Ambulatory Visit (HOSPITAL_BASED_OUTPATIENT_CLINIC_OR_DEPARTMENT_OTHER): Payer: Self-pay | Admitting: Family

## 2023-01-08 ENCOUNTER — Ambulatory Visit (HOSPITAL_BASED_OUTPATIENT_CLINIC_OR_DEPARTMENT_OTHER): Payer: No Typology Code available for payment source | Admitting: Internal Medicine

## 2023-01-08 DIAGNOSIS — R600 Localized edema: Secondary | ICD-10-CM

## 2023-01-08 DIAGNOSIS — I5032 Chronic diastolic (congestive) heart failure: Secondary | ICD-10-CM

## 2023-01-08 NOTE — Telephone Encounter (Signed)
Rx request sent to pharmacy.  

## 2023-01-22 DIAGNOSIS — G729 Myopathy, unspecified: Secondary | ICD-10-CM | POA: Diagnosis not present

## 2023-02-01 ENCOUNTER — Encounter (HOSPITAL_BASED_OUTPATIENT_CLINIC_OR_DEPARTMENT_OTHER): Payer: Medicare Other | Admitting: General Surgery

## 2023-02-05 ENCOUNTER — Encounter (HOSPITAL_BASED_OUTPATIENT_CLINIC_OR_DEPARTMENT_OTHER): Payer: Medicare Other | Attending: Internal Medicine | Admitting: Internal Medicine

## 2023-02-05 DIAGNOSIS — I110 Hypertensive heart disease with heart failure: Secondary | ICD-10-CM | POA: Diagnosis not present

## 2023-02-05 DIAGNOSIS — E11622 Type 2 diabetes mellitus with other skin ulcer: Secondary | ICD-10-CM | POA: Insufficient documentation

## 2023-02-05 DIAGNOSIS — M069 Rheumatoid arthritis, unspecified: Secondary | ICD-10-CM | POA: Diagnosis not present

## 2023-02-05 DIAGNOSIS — L89312 Pressure ulcer of right buttock, stage 2: Secondary | ICD-10-CM | POA: Diagnosis not present

## 2023-02-05 DIAGNOSIS — L89322 Pressure ulcer of left buttock, stage 2: Secondary | ICD-10-CM | POA: Insufficient documentation

## 2023-02-05 DIAGNOSIS — I890 Lymphedema, not elsewhere classified: Secondary | ICD-10-CM | POA: Diagnosis not present

## 2023-02-05 DIAGNOSIS — Z7901 Long term (current) use of anticoagulants: Secondary | ICD-10-CM | POA: Insufficient documentation

## 2023-02-05 DIAGNOSIS — I509 Heart failure, unspecified: Secondary | ICD-10-CM | POA: Diagnosis not present

## 2023-02-05 DIAGNOSIS — I87313 Chronic venous hypertension (idiopathic) with ulcer of bilateral lower extremity: Secondary | ICD-10-CM | POA: Diagnosis not present

## 2023-02-05 DIAGNOSIS — Z86718 Personal history of other venous thrombosis and embolism: Secondary | ICD-10-CM | POA: Diagnosis not present

## 2023-02-05 DIAGNOSIS — L97819 Non-pressure chronic ulcer of other part of right lower leg with unspecified severity: Secondary | ICD-10-CM | POA: Insufficient documentation

## 2023-02-07 NOTE — Progress Notes (Signed)
TRESTEN, ELWOOD ([id number]) [id number] Page 1 of 7 Visit Report for 02/05/2023 Arrival Information Details Patient Name: Date of Service: [name], [name] 02/05/2023 3:45 PM Medical Record Number: [id number] Patient Account Number: [id number] Date of Birth/Sex: Treating [name]: [name] (72 y.o. [name] Primary Care [name]: [name] Other Clinician: Referring [name]: Treating [name]/Extender: [name] in Treatment: 17 Visit Information History Since Last Visit All ordered tests and consults were completed: Yes Patient Arrived: Wheel Chair Added or deleted any medications: No Arrival Time: 15:30 Any new allergies or adverse reactions: No Accompanied By: wife Had a fall or experienced change in No Transfer Assistance: None activities of daily living that [name] affect Patient Identification Verified: Yes risk of falls: Secondary Verification Process Completed: Yes Signs or symptoms of abuse/neglect since last visito No Patient Requires Transmission-Based Precautions: No Hospitalized since last visit: No Patient Has Alerts: Yes Implantable device outside of the clinic excluding No Patient Alerts: ELIQUIS cellular tissue based products placed in the center since last visit: Has Dressing in Place as Prescribed: Yes Pain Present Now: No Electronic Signature(s) Signed: 02/07/2023 4:20:59 PM By: [name] Entered By: [name] on 02/05/2023 15:38:53 -------------------------------------------------------------------------------- Compression Therapy Details Patient Name: Date of Service: [name], [name] 02/05/2023 3:45 PM Medical Record Number: [id number] Patient Account Number: [id number] Date of Birth/Sex: Treating [name]: [name] (72 y.o. [name] Primary Care Mi Balla: [name] Other Clinician: Referring Joselin Crandell: Treating Ranee Peasley/Extender: [name] in Treatment:  17 Compression Therapy Performed for Wound Assessment: Wound #10R Left,Anterior Lower Leg Performed By: Clinician [name] Compression Type: Four Layer Post Procedure Diagnosis Same as Pre-procedure Electronic Signature(s) Signed: 02/07/2023 4:20:59 PM By: [name] Entered By: [name] on 02/05/2023 16:20:41 [name], [name] ([id number]) [id number] Page 2 of 7 -------------------------------------------------------------------------------- Encounter Discharge Information Details Patient Name: Date of Service: [name], [name] 02/05/2023 3:45 PM Medical Record Number: [id number] Patient Account Number: [id number] Date of Birth/Sex: Treating [name]: [name] (72 y.o. [name] Primary Care [name]: [name] Other Clinician: Referring [name]: Treating [name]/Extender: [name] in Treatment: 17 Encounter Discharge Information Items Discharge Condition: Stable Ambulatory Status: Wheelchair Discharge Destination: Home Transportation: Private Auto Accompanied By: wife Schedule Follow-up Appointment: Yes Clinical Summary of Care: Patient Declined Electronic Signature(s) Signed: 02/07/2023 4:20:59 PM By: [name] Entered By: [name] on 02/05/2023 16:44:37 -------------------------------------------------------------------------------- Lower Extremity Assessment Details Patient Name: Date of Service: [name], [name] 02/05/2023 3:45 PM Medical Record Number: [id number] Patient Account Number: [id number] Date of Birth/Sex: Treating [name]: [name] (72 y.o. [name] Primary Care Karsen Fellows: [name] Other Clinician: Referring [name]: Treating [name]/Extender: [name] in Treatment: 17 Edema Assessment Assessed: [name]: No] [name]: No] Edema: [Left: Yes] [Right: Yes] Calf Left: Right: Point of Measurement: 37 cm From Medial Instep 46.5 cm 48  cm Ankle Left: Right: Point of Measurement: 10 cm From Medial Instep 25 cm 29 cm Vascular Assessment Pulses: Dorsalis Pedis Palpable: [Left:Yes] [Right:Yes] Extremity colors, hair growth, and conditions: Extremity Color: [Left:Normal] [Right:Normal] Hair Growth on Extremity: [Left:No] [Right:No] Temperature of Extremity: [Left:Warm] [Right:Warm] Capillary Refill: [Left:< 3 seconds] [Right:< 3 seconds] Dependent Rubor: [Left:No No] [Right:No No] Toe Nail Assessment Left: Right: Thick: Yes Yes Discolored: Yes Yes Deformed: Yes Yes Improper Length and Hygiene: Yes Yes [name], [name] ([id number]) [id number] Page 3 of 7 Electronic Signature(s) Signed: 02/07/2023 4:20:59 PM By: [name] Entered By: [name] on 02/05/2023 15:45:10 -------------------------------------------------------------------------------- Multi Wound Chart Details Patient Name: Date of  Service: [name], [name] 02/05/2023 3:45 PM Medical Record Number: [id number] Patient Account Number: [id number] Date of Birth/Sex: Treating [name]: [name] (72 y.o. M) Primary Care Bensen Chadderdon: [name] Other Clinician: Referring [name]: Treating [name]: [name] in Treatment: 17 Vital Signs Height(in): 68 Pulse(bpm): 92 Weight(lbs): 220 Blood Pressure(mmHg): 144/74 Body Mass Index(BMI): 33.4 Temperature(F): 98.6 Respiratory Rate(breaths/min): 18 [10R:Photos:] [N/A:N/A] Left, Anterior Lower Leg N/A N/A Wound Location: Blister N/A N/A Wounding Event: Lymphedema N/A N/A Primary Etiology: Anemia, Lymphedema, Congestive N/A N/A Comorbid History: Heart Failure, Deep Vein Thrombosis, Type II Diabetes, Rheumatoid Arthritis 12/10/2022 N/A N/A Date Acquired: 8 N/A N/A Weeks of Treatment: Open N/A N/A Wound Status: Yes N/A N/A Wound Recurrence: 3.3x3.6x0.1 N/A N/A Measurements L x W x D (cm) 9.331 N/A N/A A (cm) : rea 0.933 N/A N/A Volume  (cm) : -197.00% N/A N/A % Reduction in Area: -197.10% N/A N/A % Reduction in Volume: Full Thickness Without Exposed N/A N/A Classification: Support Structures None Present N/A N/A Exudate Amount: Distinct, outline attached N/A N/A Wound Margin: None Present (0%) N/A N/A Granulation Amount: None Present (0%) N/A N/A Necrotic Amount: Fat Layer (Subcutaneous Tissue): Yes N/A N/A Exposed Structures: Fascia: No Tendon: No Muscle: No Joint: No Bone: No Small (1-33%) N/A N/A Epithelialization: Excoriation: No N/A N/A Periwound Skin Texture: Induration: No Callus: No Crepitus: No Rash: No Scarring: No Maceration: No N/A N/A Periwound Skin Moisture: Dry/Scaly: No Atrophie Blanche: No N/A N/A Periwound Skin Color: Cyanosis: No Ecchymosis: No Erythema: No Hemosiderin Staining: No Raynor, Natasha ([id number]) [id number] Page 4 of 7 Mottled: No Pallor: No Rubor: No Compression Therapy N/A N/A Procedures Performed: Treatment Notes Wound #10R (Lower Leg) Wound Laterality: Left, Anterior Cleanser Peri-Wound Care Topical Primary Dressing Maxorb Extra Ag+ Alginate Dressing, 4x4.75 (in/in) Discharge Instruction: Apply to wound bed as instructed Secondary Dressing ABD Pad, 8x10 Discharge Instruction: Apply over primary dressing as directed. Secured With Compression Wrap Urgo K2, (equivalent to a 4 layer) two layer compression system, regular Discharge Instruction: Apply Urgo K2 as directed (alternative to 4 layer compression). Compression Stockings Add-Ons Electronic Signature(s) Signed: 02/06/2023 9:33:55 AM By: Baltazar Najjar MD Entered By: Baltazar Najjar on 02/05/2023 16:55:51 -------------------------------------------------------------------------------- Multi-Disciplinary Care Plan Details Patient Name: Date of Service: [name], [name] 02/05/2023 3:45 PM Medical Record Number: [id number] Patient Account Number: [id number] Date of  Birth/Sex: Treating [name]: [birth year] [name] (72 y.o. [name] Primary Care Jencarlo Bonadonna: [name] Other Clinician: Referring Maday Guarino: Treating Notnamed Croucher/Extender: [name] in Treatment: 17 Active Inactive Electronic Signature(s) Signed: 02/07/2023 4:20:59 PM By: [name] Entered By: [name] on 02/05/2023 15:57:15 -------------------------------------------------------------------------------- Pain Assessment Details Patient Name: Date of Service: [name], [name] 02/05/2023 3:45 PM Medical Record Number: [id number] Patient Account Number: [id number] Date of Birth/Sex: Treating [name]: [name] (72 y.o. [name] Pisgah, Manraj ([id number]) [id number] Page 5 of 7 Primary Care Darric Plante: [name] Other Clinician: Referring Special Ranes: Treating Ruhaan Nordahl/Extender: [name] in Treatment: 17 Active Problems Location of Pain Severity and Description of Pain Patient Has Paino No Site Locations Pain Management and Medication Current Pain Management: Electronic Signature(s) Signed: 02/07/2023 4:20:59 PM By: [name] Entered By: [name] on 02/05/2023 15:40:17 -------------------------------------------------------------------------------- Patient/Caregiver Education Details Patient Name: Date of Service: [name], [name] 10/8/2024andnbsp3:45 PM Medical Record Number: [id number] Patient Account Number: [id number] Date of Birth/Gender: Treating [name]: [birth year] (72 y.o. [name] Primary Care Physician: [name] Other Clinician: Referring Physician: Treating Physician/Extender: [name] in Treatment: [phone] Education  Assessment Education Provided To: Patient and Caregiver Education Topics Provided Wound/Skin Impairment: Methods: Explain/Verbal Responses: State content correctly Electronic Signature(s) Signed: 02/07/2023 4:20:59 PM By:  [name] Entered By: [name] on 02/05/2023 15:57:46 Humphrey, Allin ([id number]) [id number] Page 6 of 7 -------------------------------------------------------------------------------- Wound Assessment Details Patient Name: Date of Service: [name], [name] 02/05/2023 3:45 PM Medical Record Number: [id number] Patient Account Number: [id number] Date of Birth/Sex: Treating [name]: [birth year] [name] (72 y.o. [name] Primary Care Athina Fahey: [name] Other Clinician: Referring Kimiya Brunelle: Treating Adger Cantera/Extender: [name] in Treatment: 17 Wound Status Wound Number: 10R Primary Lymphedema Etiology: Wound Location: Left, Anterior Lower Leg Wound Open Wounding Event: Blister Status: Date Acquired: 12/10/2022 Comorbid Anemia, Lymphedema, Congestive Heart Failure, Deep Vein Weeks Of Treatment: 8 History: Thrombosis, Type II Diabetes, Rheumatoid Arthritis Clustered Wound: No Photos Wound Measurements Length: (cm) 3.3 Width: (cm) 3.6 Depth: (cm) 0.1 Area: (cm) 9.331 Volume: (cm) 0.933 % Reduction in Area: -197% % Reduction in Volume: -197.1% Epithelialization: Small (1-33%) Tunneling: No Undermining: No Wound Description Classification: Full Thickness Without Exposed Support Wound Margin: Distinct, outline attached Exudate Amount: None Present Structures Foul Odor After Cleansing: No Slough/Fibrino No Wound Bed Granulation Amount: None Present (0%) Exposed Structure Necrotic Amount: None Present (0%) Fascia Exposed: No Fat Layer (Subcutaneous Tissue) Exposed: Yes Tendon Exposed: No Muscle Exposed: No Joint Exposed: No Bone Exposed: No Periwound Skin Texture Texture Color No Abnormalities Noted: No No Abnormalities Noted: No Callus: No Atrophie Blanche: No Crepitus: No Cyanosis: No Excoriation: No Ecchymosis: No Induration: No Erythema: No Rash: No Hemosiderin Staining: No Scarring: No Mottled:  No Pallor: No Moisture Rubor: No No Abnormalities Noted: No Dry / Scaly: No Maceration: No Treatment Notes Dolinski, Kendyl ([id number]) [id number] Page 7 of 7 Wound #10R (Lower Leg) Wound Laterality: Left, Anterior Cleanser Peri-Wound Care Topical Primary Dressing Maxorb Extra Ag+ Alginate Dressing, 4x4.75 (in/in) Discharge Instruction: Apply to wound bed as instructed Secondary Dressing ABD Pad, 8x10 Discharge Instruction: Apply over primary dressing as directed. Secured With Compression Wrap Urgo K2, (equivalent to a 4 layer) two layer compression system, regular Discharge Instruction: Apply Urgo K2 as directed (alternative to 4 layer compression). Compression Stockings Add-Ons Electronic Signature(s) Signed: 02/07/2023 4:20:59 PM By: [name] Entered By: [name] on 02/05/2023 15:51:22 -------------------------------------------------------------------------------- Vitals Details Patient Name: Date of Service: [name], [name] 02/05/2023 3:45 PM Medical Record Number: [id number] Patient Account Number: [id number] Date of Birth/Sex: Treating [name]: [name] (72 y.o. [name] Primary Care Gaspare Netzel: [name] Other Clinician: Referring Ara Grandmaison: Treating Farran Amsden/Extender: [name] in Treatment: 17 Vital Signs Time Taken: 15:38 Temperature (F): 98.6 Height (in): 68 Pulse (bpm): 92 Weight (lbs): 220 Respiratory Rate (breaths/min): 18 Body Mass Index (BMI): 33.4 Blood Pressure (mmHg): 144/74 Reference Range: 80 - 120 mg / dl Electronic Signature(s) Signed: 02/07/2023 4:20:59 PM By: [name] Entered By: [name] on 02/05/2023 15:40:05

## 2023-02-07 NOTE — Progress Notes (Signed)
legs bring in weekly in case your wounds heal. You may wear the whole leg juxtalite both legs now apply in the morning and at night also use when you use the pumps as well. STAT DVT study to right leg related to pain, edema, redness, Anesthetic (In clinic) Topical Lidocaine 4% applied to wound bed - In clinic Bathing/ Shower/ Hygiene May shower with protection but do not get wound dressing(s) wet. Protect dressing(s) with water repellant cover (for example, large plastic bag) or a cast cover and may then take shower. Edema Control - Lymphedema / SCD / Other Bilateral Lower Extremities Lymphedema Pumps. Use Lymphedema pumps on leg(s) 2-3 times a day for 45-60 minutes. If wearing any wraps or hose, do not remove them. Continue exercising as instructed. - Pump twice per day for one hour each time minimum over your wraps. Elevate legs to the level of the heart or above for 30 minutes daily and/or when sitting for 3-4 times a day throughout the day. Avoid standing for long periods of time. Patient to wear own compression stockings every day. SALLY, REIMERS ([id number]) [id number] Page 3 of 7 Exercise regularly Moisturize legs daily. Compression stocking or Garment 30-40 mm/Hg pressure to: - thigh high compression stockings- Circaid Juxtafit whole leg both legs. Wear daily Other Edema Control Orders/Instructions: - Please weigh yourself every other day. Wound Treatment Wound #10R - Lower Leg Wound Laterality: Left, Anterior Prim Dressing: Maxorb Extra Ag+ Alginate Dressing, 4x4.75 (in/in) ary Discharge Instructions: Apply to wound bed as instructed Secondary Dressing: ABD Pad, 8x10 Discharge Instructions: Apply over primary dressing as directed. Compression Wrap: Urgo K2, (equivalent to a 4 layer) two layer compression system, regular Discharge Instructions: Apply Urgo K2 as directed (alternative to 4 layer compression). Electronic Signature(s) Signed:  02/06/2023 9:33:55 AM By: Baltazar Najjar MD Signed: 02/07/2023 4:20:59 PM By: Brenton Grills Entered By: Brenton Grills on 02/05/2023 16:24:21 -------------------------------------------------------------------------------- Problem List Details Patient Name: Date of Service: [name], [name] 02/05/2023 3:45 PM Medical Record Number: [id number] Patient Account Number: [id number] Date of Birth/Sex: Treating RN: [birth year] (72 y.o. M) Primary Care Provider: Everrett Coombe Other Clinician: Referring Provider: Treating Provider/Extender: Shawna Clamp in Treatment: 17 Active Problems ICD-10 Encounter Code Description Active Date MDM Diagnosis I87.312 Chronic venous hypertension (idiopathic) with ulcer of left lower extremity 11/05/2022 No Yes I87.311 Chronic venous hypertension (idiopathic) with ulcer of right lower extremity 11/19/2022 No Yes S81.802A Unspecified open wound, left lower leg, initial encounter 11/05/2022 No Yes S81.801A Unspecified open wound, right lower leg, initial encounter 12/10/2022 No Yes I89.0 Lymphedema, not elsewhere classified 10/04/2022 No Yes 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, not elsewhere classified 10/04/2022 No Yes Inactive Problems Resolved Problems ICD-10 Heaphy, Jasier ([id number]) [id number] Page 4 of 7 Code Description Active Date Resolved Date L89.312 Pressure ulcer of right buttock, stage 2 10/04/2022 10/04/2022 L89.322 Pressure ulcer of left buttock, stage 2 10/04/2022 10/04/2022 L97.818 Non-pressure chronic ulcer of other part of right lower leg with other specified severity 10/04/2022 10/04/2022 B37.0 Candidal stomatitis 10/04/2022 10/04/2022 Electronic Signature(s) Signed: 02/06/2023 9:33:55 AM By: Baltazar Najjar MD Entered By: Baltazar Najjar on 02/05/2023 16:55:44 -------------------------------------------------------------------------------- Progress Note Details Patient Name: Date of Service: [name], [name] 02/05/2023 3:45 PM Medical Record Number: [id number] Patient Account Number: [id number] Date of Birth/Sex: Treating RN: 1951/01/15 (72 y.o. M) Primary Care Provider: Everrett Coombe Other Clinician: Referring Provider: Treating Provider/Extender: Shawna Clamp in Treatment: 17 Subjective History of Present Illness (HPI) ADMISSION 10/04/2022 This is a 72 year old man  legs bring in weekly in case your wounds heal. You may wear the whole leg juxtalite both legs now apply in the morning and at night also use when you use the pumps as well. STAT DVT study to right leg related to pain, edema, redness, Anesthetic (In clinic) Topical Lidocaine 4% applied to wound bed - In clinic Bathing/ Shower/ Hygiene May shower with protection but do not get wound dressing(s) wet. Protect dressing(s) with water repellant cover (for example, large plastic bag) or a cast cover and may then take shower. Edema Control - Lymphedema / SCD / Other Bilateral Lower Extremities Lymphedema Pumps. Use Lymphedema pumps on leg(s) 2-3 times a day for 45-60 minutes. If wearing any wraps or hose, do not remove them. Continue exercising as instructed. - Pump twice per day for one hour each time minimum over your wraps. Elevate legs to the level of the heart or above for 30 minutes daily and/or when sitting for 3-4 times a day throughout the day. Avoid standing for long periods of time. Patient to wear own compression stockings every day. SALLY, REIMERS ([id number]) [id number] Page 3 of 7 Exercise regularly Moisturize legs daily. Compression stocking or Garment 30-40 mm/Hg pressure to: - thigh high compression stockings- Circaid Juxtafit whole leg both legs. Wear daily Other Edema Control Orders/Instructions: - Please weigh yourself every other day. Wound Treatment Wound #10R - Lower Leg Wound Laterality: Left, Anterior Prim Dressing: Maxorb Extra Ag+ Alginate Dressing, 4x4.75 (in/in) ary Discharge Instructions: Apply to wound bed as instructed Secondary Dressing: ABD Pad, 8x10 Discharge Instructions: Apply over primary dressing as directed. Compression Wrap: Urgo K2, (equivalent to a 4 layer) two layer compression system, regular Discharge Instructions: Apply Urgo K2 as directed (alternative to 4 layer compression). Electronic Signature(s) Signed:  02/06/2023 9:33:55 AM By: Baltazar Najjar MD Signed: 02/07/2023 4:20:59 PM By: Brenton Grills Entered By: Brenton Grills on 02/05/2023 16:24:21 -------------------------------------------------------------------------------- Problem List Details Patient Name: Date of Service: [name], [name] 02/05/2023 3:45 PM Medical Record Number: [id number] Patient Account Number: [id number] Date of Birth/Sex: Treating RN: [birth year] (72 y.o. M) Primary Care Provider: Everrett Coombe Other Clinician: Referring Provider: Treating Provider/Extender: Shawna Clamp in Treatment: 17 Active Problems ICD-10 Encounter Code Description Active Date MDM Diagnosis I87.312 Chronic venous hypertension (idiopathic) with ulcer of left lower extremity 11/05/2022 No Yes I87.311 Chronic venous hypertension (idiopathic) with ulcer of right lower extremity 11/19/2022 No Yes S81.802A Unspecified open wound, left lower leg, initial encounter 11/05/2022 No Yes S81.801A Unspecified open wound, right lower leg, initial encounter 12/10/2022 No Yes I89.0 Lymphedema, not elsewhere classified 10/04/2022 No Yes 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, not elsewhere classified 10/04/2022 No Yes Inactive Problems Resolved Problems ICD-10 Heaphy, Jasier ([id number]) [id number] Page 4 of 7 Code Description Active Date Resolved Date L89.312 Pressure ulcer of right buttock, stage 2 10/04/2022 10/04/2022 L89.322 Pressure ulcer of left buttock, stage 2 10/04/2022 10/04/2022 L97.818 Non-pressure chronic ulcer of other part of right lower leg with other specified severity 10/04/2022 10/04/2022 B37.0 Candidal stomatitis 10/04/2022 10/04/2022 Electronic Signature(s) Signed: 02/06/2023 9:33:55 AM By: Baltazar Najjar MD Entered By: Baltazar Najjar on 02/05/2023 16:55:44 -------------------------------------------------------------------------------- Progress Note Details Patient Name: Date of Service: [name], [name] 02/05/2023 3:45 PM Medical Record Number: [id number] Patient Account Number: [id number] Date of Birth/Sex: Treating RN: 1951/01/15 (72 y.o. M) Primary Care Provider: Everrett Coombe Other Clinician: Referring Provider: Treating Provider/Extender: Shawna Clamp in Treatment: 17 Subjective History of Present Illness (HPI) ADMISSION 10/04/2022 This is a 72 year old man  DEVIN, GANAWAY ([id number]) [id number] Page 1 of 7 Visit Report for 02/05/2023 HPI Details Patient Name: Date of Service: [name], [name] 02/05/2023 3:45 PM Medical Record Number: [id number] Patient Account Number: [id number] Date of Birth/Sex: Treating RN: 09/29/1950 (72 y.o. 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 He follows for this with neurology. He has had biopsies that showed necrosis without inflammation. 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 In any case he is reasonably immobile at home. He has this apparatus to help him transfer. Because of difficulties getting him upstairs he has been sleeping in a recliner on the main floor. He also has a history of lymphedema and I see he was followed by PT for a period of time. He has a history of combined congestive heart failure but his last echo showed an EF of 60 to 65%. He has developed a wound on the right lateral leg over the last month or so. He also has a smaller wound on the right anterior lower leg. He is having problems with his left second toe and he has bilateral stage II ulcers over his ischial tuberosity. The patient is complaining of oral pain under his tongue. He has bilateral heel pain and increasing edema with weight gain over the past week or 2 Past medical history includes myopathy/myositis question inclusion body myositis, rheumatoid arthritis, coronary artery disease, massive pulmonary embolism, type 2 diabetes, lymphedema, combined congestive heart failure, bilateral foot drop PTSD ABIs in our clinic were 1.22 on the right 1.27 on the  left 6/17; patient presents for follow-up. He has been using silver alginate to the bilateral buttocks wounds. These have healed. We have also been using silver alginate to the right lower extremity under compression therapy. He is tolerated this well. The wounds are smaller. 6/25; patient presents for follow-up. We have been using silver alginate to the right lower extremity under compression therapy. The anterior leg wound has healed. The lateral right leg wound is smaller. 7/8; patient presents for follow-up. We have been using silver alginate under compression therapy to the right lower extremity wound and this is healed. Unfortunately he has developed a blister to the left lower extremity. 7/15; patient presents for follow-up. We have been using silver alginate under 4-layer compression to the left lower extremity. His wound has healed here. Unfortunately he has developed blistering to the right lower extremity and has open wounds despite using compression therapy daily here. 7/22; patient presents for follow-up. We have been using silver alginate under 4-layer compression to the right lower extremity. He has been using his zipper compression wrap to the left lower extremity daily. 2 wounds have healed on the right lower extremity. He has 1 remaining wound to the anterior aspect. He has no issues or complaints today. 8/2; patient presents for follow-up. We have been using silver alginate under Urgo K2 to the right lower extremity. His wounds have healed. He has zipper compression stockings to use. We have ordered him thigh-high Velcro compression wraps that are arriving in 2 weeks. 8/12; patient presents for follow-up. Unfortunately he has reopened wounds on both lower extremities. He had to stop his diuretic due to low blood pressure And now his lower  legs bring in weekly in case your wounds heal. You may wear the whole leg juxtalite both legs now apply in the morning and at night also use when you use the pumps as well. STAT DVT study to right leg related to pain, edema, redness, Anesthetic (In clinic) Topical Lidocaine 4% applied to wound bed - In clinic Bathing/ Shower/ Hygiene May shower with protection but do not get wound dressing(s) wet. Protect dressing(s) with water repellant cover (for example, large plastic bag) or a cast cover and may then take shower. Edema Control - Lymphedema / SCD / Other Bilateral Lower Extremities Lymphedema Pumps. Use Lymphedema pumps on leg(s) 2-3 times a day for 45-60 minutes. If wearing any wraps or hose, do not remove them. Continue exercising as instructed. - Pump twice per day for one hour each time minimum over your wraps. Elevate legs to the level of the heart or above for 30 minutes daily and/or when sitting for 3-4 times a day throughout the day. Avoid standing for long periods of time. Patient to wear own compression stockings every day. SALLY, REIMERS ([id number]) [id number] Page 3 of 7 Exercise regularly Moisturize legs daily. Compression stocking or Garment 30-40 mm/Hg pressure to: - thigh high compression stockings- Circaid Juxtafit whole leg both legs. Wear daily Other Edema Control Orders/Instructions: - Please weigh yourself every other day. Wound Treatment Wound #10R - Lower Leg Wound Laterality: Left, Anterior Prim Dressing: Maxorb Extra Ag+ Alginate Dressing, 4x4.75 (in/in) ary Discharge Instructions: Apply to wound bed as instructed Secondary Dressing: ABD Pad, 8x10 Discharge Instructions: Apply over primary dressing as directed. Compression Wrap: Urgo K2, (equivalent to a 4 layer) two layer compression system, regular Discharge Instructions: Apply Urgo K2 as directed (alternative to 4 layer compression). Electronic Signature(s) Signed:  02/06/2023 9:33:55 AM By: Baltazar Najjar MD Signed: 02/07/2023 4:20:59 PM By: Brenton Grills Entered By: Brenton Grills on 02/05/2023 16:24:21 -------------------------------------------------------------------------------- Problem List Details Patient Name: Date of Service: [name], [name] 02/05/2023 3:45 PM Medical Record Number: [id number] Patient Account Number: [id number] Date of Birth/Sex: Treating RN: [birth year] (72 y.o. M) Primary Care Provider: Everrett Coombe Other Clinician: Referring Provider: Treating Provider/Extender: Shawna Clamp in Treatment: 17 Active Problems ICD-10 Encounter Code Description Active Date MDM Diagnosis I87.312 Chronic venous hypertension (idiopathic) with ulcer of left lower extremity 11/05/2022 No Yes I87.311 Chronic venous hypertension (idiopathic) with ulcer of right lower extremity 11/19/2022 No Yes S81.802A Unspecified open wound, left lower leg, initial encounter 11/05/2022 No Yes S81.801A Unspecified open wound, right lower leg, initial encounter 12/10/2022 No Yes I89.0 Lymphedema, not elsewhere classified 10/04/2022 No Yes 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, not elsewhere classified 10/04/2022 No Yes Inactive Problems Resolved Problems ICD-10 Heaphy, Jasier ([id number]) [id number] Page 4 of 7 Code Description Active Date Resolved Date L89.312 Pressure ulcer of right buttock, stage 2 10/04/2022 10/04/2022 L89.322 Pressure ulcer of left buttock, stage 2 10/04/2022 10/04/2022 L97.818 Non-pressure chronic ulcer of other part of right lower leg with other specified severity 10/04/2022 10/04/2022 B37.0 Candidal stomatitis 10/04/2022 10/04/2022 Electronic Signature(s) Signed: 02/06/2023 9:33:55 AM By: Baltazar Najjar MD Entered By: Baltazar Najjar on 02/05/2023 16:55:44 -------------------------------------------------------------------------------- Progress Note Details Patient Name: Date of Service: [name], [name] 02/05/2023 3:45 PM Medical Record Number: [id number] Patient Account Number: [id number] Date of Birth/Sex: Treating RN: 1951/01/15 (72 y.o. M) Primary Care Provider: Everrett Coombe Other Clinician: Referring Provider: Treating Provider/Extender: Shawna Clamp in Treatment: 17 Subjective History of Present Illness (HPI) ADMISSION 10/04/2022 This is a 72 year old man  DEVIN, GANAWAY ([id number]) [id number] Page 1 of 7 Visit Report for 02/05/2023 HPI Details Patient Name: Date of Service: [name], [name] 02/05/2023 3:45 PM Medical Record Number: [id number] Patient Account Number: [id number] Date of Birth/Sex: Treating RN: 09/29/1950 (72 y.o. 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 He follows for this with neurology. He has had biopsies that showed necrosis without inflammation. 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 In any case he is reasonably immobile at home. He has this apparatus to help him transfer. Because of difficulties getting him upstairs he has been sleeping in a recliner on the main floor. He also has a history of lymphedema and I see he was followed by PT for a period of time. He has a history of combined congestive heart failure but his last echo showed an EF of 60 to 65%. He has developed a wound on the right lateral leg over the last month or so. He also has a smaller wound on the right anterior lower leg. He is having problems with his left second toe and he has bilateral stage II ulcers over his ischial tuberosity. The patient is complaining of oral pain under his tongue. He has bilateral heel pain and increasing edema with weight gain over the past week or 2 Past medical history includes myopathy/myositis question inclusion body myositis, rheumatoid arthritis, coronary artery disease, massive pulmonary embolism, type 2 diabetes, lymphedema, combined congestive heart failure, bilateral foot drop PTSD ABIs in our clinic were 1.22 on the right 1.27 on the  left 6/17; patient presents for follow-up. He has been using silver alginate to the bilateral buttocks wounds. These have healed. We have also been using silver alginate to the right lower extremity under compression therapy. He is tolerated this well. The wounds are smaller. 6/25; patient presents for follow-up. We have been using silver alginate to the right lower extremity under compression therapy. The anterior leg wound has healed. The lateral right leg wound is smaller. 7/8; patient presents for follow-up. We have been using silver alginate under compression therapy to the right lower extremity wound and this is healed. Unfortunately he has developed a blister to the left lower extremity. 7/15; patient presents for follow-up. We have been using silver alginate under 4-layer compression to the left lower extremity. His wound has healed here. Unfortunately he has developed blistering to the right lower extremity and has open wounds despite using compression therapy daily here. 7/22; patient presents for follow-up. We have been using silver alginate under 4-layer compression to the right lower extremity. He has been using his zipper compression wrap to the left lower extremity daily. 2 wounds have healed on the right lower extremity. He has 1 remaining wound to the anterior aspect. He has no issues or complaints today. 8/2; patient presents for follow-up. We have been using silver alginate under Urgo K2 to the right lower extremity. His wounds have healed. He has zipper compression stockings to use. We have ordered him thigh-high Velcro compression wraps that are arriving in 2 weeks. 8/12; patient presents for follow-up. Unfortunately he has reopened wounds on both lower extremities. He had to stop his diuretic due to low blood pressure And now his lower  Dr. Leanord Hawking - Please make appt. Other: - Bring in whole juxtafits for both legs bring in weekly in case your wounds heal. You may wear the whole leg juxtalite both legs now apply in the morning Farney, Muhanad ([id number]) [phone].pdf Page 6 of 7 and at night also use when you use the pumps as well. STAT DVT study to right leg related to pain, edema, redness, Anesthetic: (In clinic) Topical Lidocaine 4% applied to wound bed - In clinic Bathing/ Shower/ Hygiene: May shower with protection but do not get wound dressing(s) wet. Protect dressing(s) with water repellant cover (for example, large plastic bag) or a cast cover and may then take shower. Edema Control - Lymphedema / SCD / Other: Lymphedema Pumps. Use Lymphedema pumps on leg(s) 2-3 times a day for 45-60 minutes. If wearing any wraps or hose, do not remove them. Continue exercising as instructed. - Pump twice per day for one hour each time minimum over your wraps. Elevate legs to the level of the heart or above for 30 minutes daily and/or when sitting for 3-4 times a day throughout the day. Avoid standing for long periods of time. Patient to wear own compression stockings every day. Exercise regularly Moisturize legs daily. Compression stocking or Garment 30-40 mm/Hg pressure to: - thigh high compression stockings- Circaid Juxtafit whole leg both legs. Wear daily Other Edema Control Orders/Instructions: - Please weigh yourself every other day. WOUND #10R: - Lower Leg Wound Laterality: Left, Anterior Prim Dressing: Maxorb Extra Ag+ Alginate Dressing, 4x4.75 (in/in) ary Discharge Instructions: Apply to wound bed as instructed Secondary Dressing: ABD Pad, 8x10 Discharge Instructions: Apply over primary dressing as directed. Com pression Wrap:  Urgo K2, (equivalent to a 4 layer) two layer compression system, regular Discharge Instructions: Apply Urgo K2 as directed (alternative to 4 layer compression). 1. The cause of this patient's mixed pattern edema is uncertain. She is listed has having chronic venous insufficiency with secondary lymphedema although it is unusual for her edema of this fashion to spread up into the thighs. He also has some sacral edema but this is mild. 2. We use silver alginate ABDs and a full Urgo compression. If we can get some swelling out of the left leg the area on the left anterior tibial area should heal 3. I have told them that they can use the compression pumps over my wraps. Apparently a vendor from [employer] company told them that that was contraindicated I have never heard this. I have asked him to use 1 hour of compression pump twice a day even on the left leg but also on the right 4. They tell me that he has a cardiac history, some degree of renal insufficiency follows with both the cardiologist and nephrologist I will see if I can have a look in epic and make sure the patient is not in systemic fluid volume overload. I did not see any evidence of the at the bedside today of this though Electronic Signature(s) Signed: 02/06/2023 9:33:55 AM By: Baltazar Najjar MD Entered By: Baltazar Najjar on 02/05/2023 17:13:48 -------------------------------------------------------------------------------- SuperBill Details Patient Name: Date of Service: [name], [name] 02/05/2023 Medical Record Number: [id number] Patient Account Number: [id number] Date of Birth/Sex: Treating RN: April 27, 1951 (72 y.o. [name] Primary Care Provider: Everrett Coombe Other Clinician: Referring Provider: Treating Provider/Extender: Shawna Clamp in Treatment: 17 Diagnosis Coding ICD-10 Codes Code Description [id number] Chronic venous hypertension (idiopathic) with ulcer of left lower  extremity I87.311 Chronic venous hypertension (idiopathic) with ulcer  DEVIN, GANAWAY ([id number]) [id number] Page 1 of 7 Visit Report for 02/05/2023 HPI Details Patient Name: Date of Service: [name], [name] 02/05/2023 3:45 PM Medical Record Number: [id number] Patient Account Number: [id number] Date of Birth/Sex: Treating RN: 09/29/1950 (72 y.o. 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 He follows for this with neurology. He has had biopsies that showed necrosis without inflammation. 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 In any case he is reasonably immobile at home. He has this apparatus to help him transfer. Because of difficulties getting him upstairs he has been sleeping in a recliner on the main floor. He also has a history of lymphedema and I see he was followed by PT for a period of time. He has a history of combined congestive heart failure but his last echo showed an EF of 60 to 65%. He has developed a wound on the right lateral leg over the last month or so. He also has a smaller wound on the right anterior lower leg. He is having problems with his left second toe and he has bilateral stage II ulcers over his ischial tuberosity. The patient is complaining of oral pain under his tongue. He has bilateral heel pain and increasing edema with weight gain over the past week or 2 Past medical history includes myopathy/myositis question inclusion body myositis, rheumatoid arthritis, coronary artery disease, massive pulmonary embolism, type 2 diabetes, lymphedema, combined congestive heart failure, bilateral foot drop PTSD ABIs in our clinic were 1.22 on the right 1.27 on the  left 6/17; patient presents for follow-up. He has been using silver alginate to the bilateral buttocks wounds. These have healed. We have also been using silver alginate to the right lower extremity under compression therapy. He is tolerated this well. The wounds are smaller. 6/25; patient presents for follow-up. We have been using silver alginate to the right lower extremity under compression therapy. The anterior leg wound has healed. The lateral right leg wound is smaller. 7/8; patient presents for follow-up. We have been using silver alginate under compression therapy to the right lower extremity wound and this is healed. Unfortunately he has developed a blister to the left lower extremity. 7/15; patient presents for follow-up. We have been using silver alginate under 4-layer compression to the left lower extremity. His wound has healed here. Unfortunately he has developed blistering to the right lower extremity and has open wounds despite using compression therapy daily here. 7/22; patient presents for follow-up. We have been using silver alginate under 4-layer compression to the right lower extremity. He has been using his zipper compression wrap to the left lower extremity daily. 2 wounds have healed on the right lower extremity. He has 1 remaining wound to the anterior aspect. He has no issues or complaints today. 8/2; patient presents for follow-up. We have been using silver alginate under Urgo K2 to the right lower extremity. His wounds have healed. He has zipper compression stockings to use. We have ordered him thigh-high Velcro compression wraps that are arriving in 2 weeks. 8/12; patient presents for follow-up. Unfortunately he has reopened wounds on both lower extremities. He had to stop his diuretic due to low blood pressure And now his lower

## 2023-02-14 ENCOUNTER — Encounter (HOSPITAL_BASED_OUTPATIENT_CLINIC_OR_DEPARTMENT_OTHER): Payer: Medicare Other | Admitting: Internal Medicine

## 2023-02-14 DIAGNOSIS — I890 Lymphedema, not elsewhere classified: Secondary | ICD-10-CM | POA: Diagnosis not present

## 2023-02-14 DIAGNOSIS — L97819 Non-pressure chronic ulcer of other part of right lower leg with unspecified severity: Secondary | ICD-10-CM | POA: Diagnosis not present

## 2023-02-14 DIAGNOSIS — L89312 Pressure ulcer of right buttock, stage 2: Secondary | ICD-10-CM | POA: Diagnosis not present

## 2023-02-14 DIAGNOSIS — L89322 Pressure ulcer of left buttock, stage 2: Secondary | ICD-10-CM | POA: Diagnosis not present

## 2023-02-14 DIAGNOSIS — I87313 Chronic venous hypertension (idiopathic) with ulcer of bilateral lower extremity: Secondary | ICD-10-CM | POA: Diagnosis not present

## 2023-02-14 DIAGNOSIS — E11622 Type 2 diabetes mellitus with other skin ulcer: Secondary | ICD-10-CM | POA: Diagnosis not present

## 2023-02-19 NOTE — Progress Notes (Signed)
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 - Dr. Leanord Hawking Thursday 10/24/02024 315pm Return A ppointment in 2 weeks. - Dr. Leanord Hawking Please make appt Tuesday Return A Other: - wear juxtafit HD to whole to left leg. Wear thigh juxtafit to right thigh. Anesthetic (In clinic) Topical Lidocaine 4% applied to wound bed - In clinic Bathing/ Shower/ Hygiene May shower with protection but do not get wound dressing(s) wet. Protect dressing(s) with water repellant cover (for example, large plastic bag) or a cast cover and may then take shower. Edema Control - Lymphedema / SCD / Other Rushlow, Daemon ([id number]) [id number] Page 3 of 7 Bilateral Lower Extremities Lymphedema Pumps. Use Lymphedema pumps on leg(s) 2-3 times a day for 45-60 minutes. If wearing any wraps or hose, do not remove them. Continue exercising as instructed. - Pump twice per day for one hour each time minimum over your wraps. Elevate legs to the level of the heart or above for 30 minutes daily and/or when sitting for 3-4 times a day throughout the day. Avoid standing for long periods of time. Patient to wear own compression stockings every day. Exercise regularly Moisturize legs daily. Compression stocking or Garment 30-40 mm/Hg pressure to: - thigh high compression stockings- Circaid Juxtafit whole leg left leg. Wear daily Other Edema Control Orders/Instructions: - Please weigh yourself every other day. ****Apply tubigrip size E double layer to left leg until you arrive at home start wearing juxtafit HD to whole leg. Wound  Treatment Wound #13 - Lower Leg Wound Laterality: Right, Anterior Cleanser: Soap and Water 1 x Per Week/30 Days Discharge Instructions: May shower and wash wound with dial antibacterial soap and water prior to dressing change. Cleanser: Vashe 5.8 (oz) 1 x Per Week/30 Days Discharge Instructions: Cleanse the wound with Vashe prior to applying a clean dressing using gauze sponges, not tissue or cotton balls. Peri-Wound Care: Sween Lotion (Moisturizing lotion) 1 x Per Week/30 Days Discharge Instructions: Apply moisturizing lotion as directed Prim Dressing: Maxorb Extra Ag+ Alginate Dressing, 2x2 (in/in) 1 x Per Week/30 Days ary Discharge Instructions: Apply to wound bed as instructed Secondary Dressing: ABD Pad, 8x10 1 x Per Week/30 Days Discharge Instructions: Apply over primary dressing as directed. Compression Wrap: Urgo K2, (equivalent to a 4 layer) two layer compression system, regular 1 x Per Week/30 Days Discharge Instructions: Apply Urgo K2 as directed (alternative to 4 layer compression). Electronic Signature(s) Signed: 02/14/2023 6:11:41 PM By: Shawn Stall RN, BSN Signed: 02/18/2023 6:11:24 PM By: Baltazar Najjar MD Entered By: Shawn Stall on 02/14/2023 16:30:33 -------------------------------------------------------------------------------- Problem List Details Patient Name: Date of Service: [name], [name] 02/14/2023 3:15 PM Medical Record Number: [id number] Patient Account Number: [id number] Date of Birth/Sex: Treating RN: 1950-11-16 (72 y.o. Tammy Sours Primary Care Provider: Everrett Coombe Other Clinician: Referring Provider: Treating Provider/Extender: Shawna Clamp in Treatment: 19 Active Problems ICD-10 Encounter Code Description Active Date MDM Diagnosis I87.312 Chronic venous hypertension (idiopathic) with ulcer of left lower extremity 11/05/2022 No Yes I87.311 Chronic venous hypertension (idiopathic) with ulcer of right lower extremity  11/19/2022 No Yes S81.802A Unspecified open wound, left lower leg, initial encounter 11/05/2022 No Yes S81.801A Unspecified open  [name], [name] ([id number]) [id number] Page 1 of 7 Visit Report for 02/14/2023 HPI Details Patient Name: Date of Service: [name], [name] 02/14/2023 3:15 PM Medical Record Number: [id number] Patient Account Number: [id number] Date of Birth/Sex: Treating RN: Oct 30, 1950 (72 y.o. M) Primary Care Provider: Everrett Coombe Other Clinician: Referring Provider: Treating Provider/Extender: Shawna Clamp in Treatment: 19 History of Present Illness HPI Description: ADMISSION 10/04/2022 This is a 72 year old man who is quite disabled because of some form of myopathy. He follows for this with neurology. He has had biopsies that showed necrosis without inflammation. 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 In any case he is reasonably immobile at home. He has this apparatus to help him transfer. Because of difficulties getting him upstairs he has been sleeping in a recliner on the main floor. He also has a history of lymphedema and I see he was followed by PT for a period of time. He has a history of combined congestive heart failure but his last echo showed an EF of 60 to 65%. He has developed a wound on the right lateral leg over the last month or so. He also has a smaller wound on the right anterior lower leg. He is having problems with his left second toe and he has bilateral stage II ulcers over his ischial tuberosity. The patient is complaining of oral pain under his tongue. He has bilateral heel pain and increasing edema with weight gain over the past week or 2 Past medical history includes myopathy/myositis question inclusion body myositis, rheumatoid arthritis, coronary artery disease, massive pulmonary embolism, type 2 diabetes, lymphedema, combined congestive heart failure, bilateral foot drop PTSD ABIs in our clinic were 1.22 on the right 1.27 on the  left 6/17; patient presents for follow-up. He has been using silver alginate to the bilateral buttocks wounds. These have healed. We have also been using silver alginate to the right lower extremity under compression therapy. He is tolerated this well. The wounds are smaller. 6/25; patient presents for follow-up. We have been using silver alginate to the right lower extremity under compression therapy. The anterior leg wound has healed. The lateral right leg wound is smaller. 7/8; patient presents for follow-up. We have been using silver alginate under compression therapy to the right lower extremity wound and this is healed. Unfortunately he has developed a blister to the left lower extremity. 7/15; patient presents for follow-up. We have been using silver alginate under 4-layer compression to the left lower extremity. His wound has healed here. Unfortunately he has developed blistering to the right lower extremity and has open wounds despite using compression therapy daily here. 7/22; patient presents for follow-up. We have been using silver alginate under 4-layer compression to the right lower extremity. He has been using his zipper compression wrap to the left lower extremity daily. 2 wounds have healed on the right lower extremity. He has 1 remaining wound to the anterior aspect. He has no issues or complaints today. 8/2; patient presents for follow-up. We have been using silver alginate under Urgo K2 to the right lower extremity. His wounds have healed. He has zipper compression stockings to use. We have ordered him thigh-high Velcro compression wraps that are arriving in 2 weeks. 8/12; patient presents for follow-up. Unfortunately he has reopened wounds on both lower extremities. He had to stop his diuretic due to low blood pressure And now his lower  wound, right lower leg, initial encounter 12/10/2022 No Yes Weisenberger, Markcus ([id number]) [id number] Page 4 of 7 I89.0 Lymphedema, not elsewhere classified 10/04/2022 No Yes 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, not elsewhere classified 10/04/2022 No Yes Inactive Problems Resolved Problems ICD-10 Code Description Active Date Resolved Date L89.312 Pressure ulcer of right buttock, stage 2 10/04/2022 10/04/2022 L89.322 Pressure ulcer of left buttock, stage 2 10/04/2022 10/04/2022 L97.818 Non-pressure chronic ulcer of other part of right lower leg with other specified severity 10/04/2022 10/04/2022 B37.0 Candidal stomatitis 10/04/2022 10/04/2022 Electronic Signature(s) Signed: 02/18/2023 6:11:24 PM By: Baltazar Najjar MD Entered By: Baltazar Najjar on 02/14/2023 16:41:38 -------------------------------------------------------------------------------- Progress Note Details Patient Name: Date of Service: BELVIN, NEELS 02/14/2023 3:15 PM Medical Record Number: [id number] Patient Account Number: [id number] Date of Birth/Sex: Treating RN: 03/02/1951 (72 y.o. M) Primary Care Provider: Everrett Coombe Other Clinician: Referring Provider: Treating Provider/Extender: Shawna Clamp in Treatment: 19 Subjective History of Present Illness (HPI) ADMISSION 10/04/2022 This is a 72 year old man who is quite disabled because of some form of myopathy. He follows for this with neurology. He has had biopsies that showed necrosis without inflammation. 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 In any case he is reasonably immobile at home. He has this apparatus to help him transfer. Because of difficulties getting him upstairs he has been sleeping in a  recliner on the main floor. He also has a history of lymphedema and I see he was followed by PT for a period of time. He has a history of combined congestive heart failure but his last echo showed an EF of 60 to 65%. He has developed a wound on the right lateral leg over the last month or so. He also has a smaller wound on the right anterior lower leg. He is having problems with his left second toe and he has bilateral stage II ulcers over his ischial tuberosity. The patient is complaining of oral pain under his tongue. He has bilateral heel pain and increasing edema with weight gain over the past week or 2 Past medical history includes myopathy/myositis question inclusion body myositis, rheumatoid arthritis, coronary artery disease, massive pulmonary embolism, type 2 diabetes, lymphedema, combined congestive heart failure, bilateral foot drop PTSD ABIs in our clinic were 1.22 on the right 1.27 on the left 6/17; patient presents for follow-up. He has been using silver alginate to the bilateral buttocks wounds. These have healed. We have also been using silver alginate to the right lower extremity under compression therapy. He is tolerated this well. The wounds are smaller. 6/25; patient presents for follow-up. We have been using silver alginate to the right lower extremity under compression therapy. The anterior leg wound has healed. The lateral right leg wound is smaller. DEREON, RYEL ([id number]) [id number] Page 5 of 7 7/8; patient presents for follow-up. We have been using silver alginate under compression therapy to the right lower extremity wound and this is healed. Unfortunately he has developed a blister to the left lower extremity. 7/15; patient presents for follow-up. We have been using silver alginate under 4-layer compression to the left lower extremity. His wound has healed here. Unfortunately he has developed blistering to the right lower extremity and has  open wounds despite using compression therapy daily here. 7/22; patient presents for follow-up. We have been using silver alginate under 4-layer compression to the right lower extremity. He has been using his zipper  [name], [name] ([id number]) [id number] Page 1 of 7 Visit Report for 02/14/2023 HPI Details Patient Name: Date of Service: [name], [name] 02/14/2023 3:15 PM Medical Record Number: [id number] Patient Account Number: [id number] Date of Birth/Sex: Treating RN: Oct 30, 1950 (72 y.o. M) Primary Care Provider: Everrett Coombe Other Clinician: Referring Provider: Treating Provider/Extender: Shawna Clamp in Treatment: 19 History of Present Illness HPI Description: ADMISSION 10/04/2022 This is a 72 year old man who is quite disabled because of some form of myopathy. He follows for this with neurology. He has had biopsies that showed necrosis without inflammation. 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 In any case he is reasonably immobile at home. He has this apparatus to help him transfer. Because of difficulties getting him upstairs he has been sleeping in a recliner on the main floor. He also has a history of lymphedema and I see he was followed by PT for a period of time. He has a history of combined congestive heart failure but his last echo showed an EF of 60 to 65%. He has developed a wound on the right lateral leg over the last month or so. He also has a smaller wound on the right anterior lower leg. He is having problems with his left second toe and he has bilateral stage II ulcers over his ischial tuberosity. The patient is complaining of oral pain under his tongue. He has bilateral heel pain and increasing edema with weight gain over the past week or 2 Past medical history includes myopathy/myositis question inclusion body myositis, rheumatoid arthritis, coronary artery disease, massive pulmonary embolism, type 2 diabetes, lymphedema, combined congestive heart failure, bilateral foot drop PTSD ABIs in our clinic were 1.22 on the right 1.27 on the  left 6/17; patient presents for follow-up. He has been using silver alginate to the bilateral buttocks wounds. These have healed. We have also been using silver alginate to the right lower extremity under compression therapy. He is tolerated this well. The wounds are smaller. 6/25; patient presents for follow-up. We have been using silver alginate to the right lower extremity under compression therapy. The anterior leg wound has healed. The lateral right leg wound is smaller. 7/8; patient presents for follow-up. We have been using silver alginate under compression therapy to the right lower extremity wound and this is healed. Unfortunately he has developed a blister to the left lower extremity. 7/15; patient presents for follow-up. We have been using silver alginate under 4-layer compression to the left lower extremity. His wound has healed here. Unfortunately he has developed blistering to the right lower extremity and has open wounds despite using compression therapy daily here. 7/22; patient presents for follow-up. We have been using silver alginate under 4-layer compression to the right lower extremity. He has been using his zipper compression wrap to the left lower extremity daily. 2 wounds have healed on the right lower extremity. He has 1 remaining wound to the anterior aspect. He has no issues or complaints today. 8/2; patient presents for follow-up. We have been using silver alginate under Urgo K2 to the right lower extremity. His wounds have healed. He has zipper compression stockings to use. We have ordered him thigh-high Velcro compression wraps that are arriving in 2 weeks. 8/12; patient presents for follow-up. Unfortunately he has reopened wounds on both lower extremities. He had to stop his diuretic due to low blood pressure And now his lower  Coding ICD-10 Coding Code Description I87.312 Chronic venous hypertension  (idiopathic) with ulcer of left lower extremity I87.311 Chronic venous hypertension (idiopathic) with ulcer of right lower extremity S81.802A Unspecified open wound, left lower leg, initial encounter S81.801A Unspecified open wound, right lower leg, initial encounter I89.0 Lymphedema, not elsewhere classified 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 - Dr. Leanord Hawking Thursday 10/24/02024 315pm Return A ppointment in 2 weeks. - Dr. Leanord Hawking Please make appt Tuesday Return A Other: - wear juxtafit HD to whole to left leg. Wear thigh juxtafit to right thigh. Anesthetic (In clinic) Topical Lidocaine 4% applied to wound bed - In clinic Bathing/ Shower/ Hygiene May shower with protection but do not get wound dressing(s) wet. Protect dressing(s) with water repellant cover (for example, large plastic bag) or a cast cover and may then take shower. Edema Control - Lymphedema / SCD / Other Rushlow, Daemon ([id number]) [id number] Page 3 of 7 Bilateral Lower Extremities Lymphedema Pumps. Use Lymphedema pumps on leg(s) 2-3 times a day for 45-60 minutes. If wearing any wraps or hose, do not remove them. Continue exercising as instructed. - Pump twice per day for one hour each time minimum over your wraps. Elevate legs to the level of the heart or above for 30 minutes daily and/or when sitting for 3-4 times a day throughout the day. Avoid standing for long periods of time. Patient to wear own compression stockings every day. Exercise regularly Moisturize legs daily. Compression stocking or Garment 30-40 mm/Hg pressure to: - thigh high compression stockings- Circaid Juxtafit whole leg left leg. Wear daily Other Edema Control Orders/Instructions: - Please weigh yourself every other day. ****Apply tubigrip size E double layer to left leg until you arrive at home start wearing juxtafit HD to whole leg. Wound  Treatment Wound #13 - Lower Leg Wound Laterality: Right, Anterior Cleanser: Soap and Water 1 x Per Week/30 Days Discharge Instructions: May shower and wash wound with dial antibacterial soap and water prior to dressing change. Cleanser: Vashe 5.8 (oz) 1 x Per Week/30 Days Discharge Instructions: Cleanse the wound with Vashe prior to applying a clean dressing using gauze sponges, not tissue or cotton balls. Peri-Wound Care: Sween Lotion (Moisturizing lotion) 1 x Per Week/30 Days Discharge Instructions: Apply moisturizing lotion as directed Prim Dressing: Maxorb Extra Ag+ Alginate Dressing, 2x2 (in/in) 1 x Per Week/30 Days ary Discharge Instructions: Apply to wound bed as instructed Secondary Dressing: ABD Pad, 8x10 1 x Per Week/30 Days Discharge Instructions: Apply over primary dressing as directed. Compression Wrap: Urgo K2, (equivalent to a 4 layer) two layer compression system, regular 1 x Per Week/30 Days Discharge Instructions: Apply Urgo K2 as directed (alternative to 4 layer compression). Electronic Signature(s) Signed: 02/14/2023 6:11:41 PM By: Shawn Stall RN, BSN Signed: 02/18/2023 6:11:24 PM By: Baltazar Najjar MD Entered By: Shawn Stall on 02/14/2023 16:30:33 -------------------------------------------------------------------------------- Problem List Details Patient Name: Date of Service: [name], [name] 02/14/2023 3:15 PM Medical Record Number: [id number] Patient Account Number: [id number] Date of Birth/Sex: Treating RN: 1950-11-16 (72 y.o. Tammy Sours Primary Care Provider: Everrett Coombe Other Clinician: Referring Provider: Treating Provider/Extender: Shawna Clamp in Treatment: 19 Active Problems ICD-10 Encounter Code Description Active Date MDM Diagnosis I87.312 Chronic venous hypertension (idiopathic) with ulcer of left lower extremity 11/05/2022 No Yes I87.311 Chronic venous hypertension (idiopathic) with ulcer of right lower extremity  11/19/2022 No Yes S81.802A Unspecified open wound, left lower leg, initial encounter 11/05/2022 No Yes S81.801A Unspecified open  Days Discharge Instructions: Cleanse the wound with Vashe prior to applying a clean  dressing using gauze sponges, not tissue or cotton balls. Peri-Wound Care: Sween Lotion (Moisturizing lotion) 1 x Per Week/30 Days Discharge Instructions: Apply moisturizing lotion as directed Prim Dressing: Maxorb Extra Ag+ Alginate Dressing, 2x2 (in/in) 1 x Per Week/30 Days ary Discharge Instructions: Apply to wound bed as instructed Secondary Dressing: ABD Pad, 8x10 1 x Per Week/30 Days Discharge Instructions: Apply over primary dressing as directed. Com pression Wrap: Urgo K2, (equivalent to a 4 layer) two layer compression system, regular 1 x Per Week/30 Days Discharge Instructions: Apply Urgo K2 as directed (alternative to 4 layer compression). 1. We discharged him into his juxta fit stocking on the left 2. Hydrofera Blue and an Urgo K2 on the right 3. The cause of his generalized volume overload is not really clear to me. Apparently cannot get a appointment with primary care to next month. He has a cardiologist. He is on torsemide 20 but apparently has a very soft blood pressure Electronic Signature(s) Signed: 02/18/2023 6:11:24 PM By: Baltazar Najjar MD Entered By: Baltazar Najjar on 02/14/2023 16:44:26 -------------------------------------------------------------------------------- SuperBill Details Patient Name: Date of Service: KHAMANI, FABRY 02/14/2023 Medical Record Number: [id number] Patient Account Number: [id number] Date of Birth/Sex: Treating RN: 11-Sep-1950 (72 y.o. Tammy Sours Primary Care Provider: Everrett Coombe Other Clinician: Referring Provider: Treating Provider/Extender: Shawna Clamp in Treatment: [address], [place] ([id number]) [id number] Page 7 of 7 ICD-10 Codes Code Description I87.312 Chronic venous hypertension (idiopathic) with ulcer of left lower extremity I87.311 Chronic venous hypertension (idiopathic) with ulcer of right lower extremity S81.802A Unspecified open wound, left lower  leg, initial encounter S81.801A Unspecified open wound, right lower leg, initial encounter I89.0 Lymphedema, not elsewhere classified 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, not elsewhere classified Facility Procedures : CPT4 Code: [id number] Description: (Facility Use Only) 30865HQ - APPLY MULTLAY COMPRS LWR RT LEG Modifier: Quantity: 1 Physician Procedures : CPT4 Code Description Modifier [id number] - WC PHYS LEVEL 3 - EST PT ICD-10 Diagnosis Description S81.801A Unspecified open wound, right lower leg, initial encounter I87.311 Chronic venous hypertension (idiopathic) with ulcer of right lower  extremity Quantity: 1 Electronic Signature(s) Signed: 02/18/2023 6:11:24 PM By: Baltazar Najjar MD Entered By: Baltazar Najjar on 02/14/2023 16:45:43  Coding ICD-10 Coding Code Description I87.312 Chronic venous hypertension  (idiopathic) with ulcer of left lower extremity I87.311 Chronic venous hypertension (idiopathic) with ulcer of right lower extremity S81.802A Unspecified open wound, left lower leg, initial encounter S81.801A Unspecified open wound, right lower leg, initial encounter I89.0 Lymphedema, not elsewhere classified 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 - Dr. Leanord Hawking Thursday 10/24/02024 315pm Return A ppointment in 2 weeks. - Dr. Leanord Hawking Please make appt Tuesday Return A Other: - wear juxtafit HD to whole to left leg. Wear thigh juxtafit to right thigh. Anesthetic (In clinic) Topical Lidocaine 4% applied to wound bed - In clinic Bathing/ Shower/ Hygiene May shower with protection but do not get wound dressing(s) wet. Protect dressing(s) with water repellant cover (for example, large plastic bag) or a cast cover and may then take shower. Edema Control - Lymphedema / SCD / Other Rushlow, Daemon ([id number]) [id number] Page 3 of 7 Bilateral Lower Extremities Lymphedema Pumps. Use Lymphedema pumps on leg(s) 2-3 times a day for 45-60 minutes. If wearing any wraps or hose, do not remove them. Continue exercising as instructed. - Pump twice per day for one hour each time minimum over your wraps. Elevate legs to the level of the heart or above for 30 minutes daily and/or when sitting for 3-4 times a day throughout the day. Avoid standing for long periods of time. Patient to wear own compression stockings every day. Exercise regularly Moisturize legs daily. Compression stocking or Garment 30-40 mm/Hg pressure to: - thigh high compression stockings- Circaid Juxtafit whole leg left leg. Wear daily Other Edema Control Orders/Instructions: - Please weigh yourself every other day. ****Apply tubigrip size E double layer to left leg until you arrive at home start wearing juxtafit HD to whole leg. Wound  Treatment Wound #13 - Lower Leg Wound Laterality: Right, Anterior Cleanser: Soap and Water 1 x Per Week/30 Days Discharge Instructions: May shower and wash wound with dial antibacterial soap and water prior to dressing change. Cleanser: Vashe 5.8 (oz) 1 x Per Week/30 Days Discharge Instructions: Cleanse the wound with Vashe prior to applying a clean dressing using gauze sponges, not tissue or cotton balls. Peri-Wound Care: Sween Lotion (Moisturizing lotion) 1 x Per Week/30 Days Discharge Instructions: Apply moisturizing lotion as directed Prim Dressing: Maxorb Extra Ag+ Alginate Dressing, 2x2 (in/in) 1 x Per Week/30 Days ary Discharge Instructions: Apply to wound bed as instructed Secondary Dressing: ABD Pad, 8x10 1 x Per Week/30 Days Discharge Instructions: Apply over primary dressing as directed. Compression Wrap: Urgo K2, (equivalent to a 4 layer) two layer compression system, regular 1 x Per Week/30 Days Discharge Instructions: Apply Urgo K2 as directed (alternative to 4 layer compression). Electronic Signature(s) Signed: 02/14/2023 6:11:41 PM By: Shawn Stall RN, BSN Signed: 02/18/2023 6:11:24 PM By: Baltazar Najjar MD Entered By: Shawn Stall on 02/14/2023 16:30:33 -------------------------------------------------------------------------------- Problem List Details Patient Name: Date of Service: [name], [name] 02/14/2023 3:15 PM Medical Record Number: [id number] Patient Account Number: [id number] Date of Birth/Sex: Treating RN: 1950-11-16 (72 y.o. Tammy Sours Primary Care Provider: Everrett Coombe Other Clinician: Referring Provider: Treating Provider/Extender: Shawna Clamp in Treatment: 19 Active Problems ICD-10 Encounter Code Description Active Date MDM Diagnosis I87.312 Chronic venous hypertension (idiopathic) with ulcer of left lower extremity 11/05/2022 No Yes I87.311 Chronic venous hypertension (idiopathic) with ulcer of right lower extremity  11/19/2022 No Yes S81.802A Unspecified open wound, left lower leg, initial encounter 11/05/2022 No Yes S81.801A Unspecified open

## 2023-02-21 ENCOUNTER — Encounter (HOSPITAL_BASED_OUTPATIENT_CLINIC_OR_DEPARTMENT_OTHER): Payer: Medicare Other | Admitting: Internal Medicine

## 2023-02-21 DIAGNOSIS — E11622 Type 2 diabetes mellitus with other skin ulcer: Secondary | ICD-10-CM | POA: Diagnosis not present

## 2023-02-21 DIAGNOSIS — L89322 Pressure ulcer of left buttock, stage 2: Secondary | ICD-10-CM | POA: Diagnosis not present

## 2023-02-21 DIAGNOSIS — L97819 Non-pressure chronic ulcer of other part of right lower leg with unspecified severity: Secondary | ICD-10-CM | POA: Diagnosis not present

## 2023-02-21 DIAGNOSIS — I890 Lymphedema, not elsewhere classified: Secondary | ICD-10-CM | POA: Diagnosis not present

## 2023-02-21 DIAGNOSIS — I87313 Chronic venous hypertension (idiopathic) with ulcer of bilateral lower extremity: Secondary | ICD-10-CM | POA: Diagnosis not present

## 2023-02-21 DIAGNOSIS — L97812 Non-pressure chronic ulcer of other part of right lower leg with fat layer exposed: Secondary | ICD-10-CM | POA: Diagnosis not present

## 2023-02-21 DIAGNOSIS — L89312 Pressure ulcer of right buttock, stage 2: Secondary | ICD-10-CM | POA: Diagnosis not present

## 2023-02-22 ENCOUNTER — Encounter (HOSPITAL_BASED_OUTPATIENT_CLINIC_OR_DEPARTMENT_OTHER): Payer: Self-pay | Admitting: Family

## 2023-02-22 ENCOUNTER — Ambulatory Visit (HOSPITAL_BASED_OUTPATIENT_CLINIC_OR_DEPARTMENT_OTHER): Payer: Medicare Other | Admitting: Family

## 2023-02-22 VITALS — BP 118/78 | HR 100 | Ht 68.0 in | Wt 228.2 lb

## 2023-02-22 DIAGNOSIS — I5042 Chronic combined systolic (congestive) and diastolic (congestive) heart failure: Secondary | ICD-10-CM | POA: Diagnosis not present

## 2023-02-22 DIAGNOSIS — I25118 Atherosclerotic heart disease of native coronary artery with other forms of angina pectoris: Secondary | ICD-10-CM | POA: Diagnosis not present

## 2023-02-22 DIAGNOSIS — Z7901 Long term (current) use of anticoagulants: Secondary | ICD-10-CM | POA: Diagnosis not present

## 2023-02-22 DIAGNOSIS — G4733 Obstructive sleep apnea (adult) (pediatric): Secondary | ICD-10-CM

## 2023-02-22 DIAGNOSIS — I10 Essential (primary) hypertension: Secondary | ICD-10-CM | POA: Diagnosis not present

## 2023-02-22 DIAGNOSIS — E785 Hyperlipidemia, unspecified: Secondary | ICD-10-CM

## 2023-02-22 NOTE — Patient Instructions (Signed)
Medication Instructions:  Your physician recommends that you continue on your current medications as directed. Please refer to the Current Medication list given to you today.  *If you need a refill on your cardiac medications before your next appointment, please call your pharmacy*  Lab Work: The following labs have been ordered for today: CMP, Direct LDL and BNP If you have labs (blood work) drawn today and your tests are completely normal, you will receive your results only by: MyChart Message (if you have MyChart) OR A paper copy in the mail If you have any lab test that is abnormal or we need to change your treatment, we will call you to review the results.  Follow-Up: At [facility], you and your health needs are our priority.  As part of our continuing mission to provide you with exceptional heart care, we have created designated Provider Care Teams.  These Care Teams include your primary Cardiologist (physician) and Advanced Practice Providers (APPs -  Physician Assistants and Nurse Practitioners) who all work together to provide you with the care you need, when you need it.  We recommend signing up for the patient portal called "MyChart".  Sign up information is provided on this After Visit Summary.  MyChart is used to connect with patients for Virtual Visits (Telemedicine).  Patients are able to view lab/test results, encounter notes, upcoming appointments, etc.  Non-urgent messages can be sent to your provider as well.   To learn more about what you can do with MyChart, go to [email or web address].    Your next appointment:   6 month follow up with Gillian Shields, NP

## 2023-02-22 NOTE — Progress Notes (Signed)
Cardiology Office Note:  .   Date:  02/22/2023  ID:  [name], DOB March 27, 1951, MRN [id number] PCP: Everrett Coombe, DO  [facility] Providers Cardiologist:  Chrystie Nose, MD    History of Present Illness: .   [name] is a 72 y.o. male with a hx of hypertension, DM2, nonobstructive CAD, autoimmune myositis, PTSD, PE, DVT, HFrEF, CKD 3.   He was admitted 07/21/2021 and acute PE as well as acute RLE DVT with acute hypoxic respiratory failure in the setting of testosterone injections were discontinued.  Started on heparin and transitioned to Eliquis.  Recommended for indefinite OAC.  Echo  07/23/2021 with LVEF 35-40%, wall motion abnormalities noted, grade 1 diastolic dysfunction, trivial MR, mild AI, mild dilation ascending aorta 41 mm.  Due to AKI he was recommended for outpatient ischemic evaluation.   Seen 08/14/2021 Imdur discontinued due to patient report of headache.  Cardiac CTA 08/28/2021 coronary calcium score of 129 placing him in the 64th percentile for age, gender. LM 1-24% stenosis and ostial/prox LAD mild 25-49% stenosis (FFR 0.88 prox and 0.82 mid - no significant stenosis). For optimization of therapy, 10/09/21 Spironolactone added and 10/27/21 Hydralazine transitioned to [place]. Repeat echo 12/20/21 LVEF 55-60%, stable wall motion abnormalities, gr1DD.   Underwent sleep study 02/20/2022 with moderate OSA recommended for CPAP titration.   Admitted 11/12 - 03/15/2022 with AMS found to have pneumonia as well as heart failure exacerbation requiring diuresis.  He was discharged on torsemide.  Entresto discontinued due to hypotension.  Carvedilol dose reduced.  Echo during that admission with LVEF 55 to 60%, grade 1 diastolic dysfunction, aortic valve sclerosis without stenosis, mild dilation aortic root 41 mm.     Readmitted 11/20 - 03/24/2022 with acute metabolic encephalopathy with hypoglycemia, dehydration.  No evidence of infection.  Spironolactone, torsemide were  discontinued.   He saw PCP 03/29/2022 with Torsemide was resumed. At follow up 04/06/22 Torsemide increased to 20mg  QD due to bilateral LE 2+ pitting edema. He was set up for autoPAP.    Seen 06/05/2022 and due to bilateral 2+ pitting edema recommended torsemide 40 mg daily for 3 days then return to 20 mg daily.  Spironolactone 12.5 mg was added.  However he did not stay on long-term.   Admitted 5/15-5/18/24 with LOC seizure vs syncope. CT/MRI brain unremarkable. EEG no seizures. Workup did reveal various degrees of foraminal stenosis recommended for outpatient neurosurgery evaluation. Echo 09/13/22 LVEF 60-65%, no RWMA, mild LVH, no significant valvular abnomalities.    He presents today for follow-up with his wife. His 52 year old son is a Holiday representative at [employer] with goal of being an MD. Reports intermittent dyspnea taking only a minute to recover. Some coughing which wife attributes to allergies. Has been taking Zyrtec. No wheezing. Recently started Ozempic with endocrinology, encouraged to continue to hydrate and eat protein at meal times. No chest pain, orthopnea, PND. Edema has been stable followed by wound clinic, wearing compression wraps, and using lymphedema pump.   ROS: Please see the history of present illness.    All other systems reviewed and are negative.   Studies Reviewed: .        Cardiac Studies & Procedures       ECHOCARDIOGRAM  ECHOCARDIOGRAM COMPLETE 09/13/2022  Narrative ECHOCARDIOGRAM REPORT    Patient Name:   [name] Date of Exam: 09/13/2022 Medical Rec #:  [id number]   Height:       68.0 in Accession #:    [id number]  Weight:  219.8 lb Date of Birth:  1951-01-04    BSA:          2.127 m Patient Age:    71 years    BP:           102/77 mmHg Patient Gender: M           HR:           73 bpm. Exam Location:  Inpatient  Procedure: 2D Echo, Cardiac Doppler, Color Doppler and Intracardiac Opacification Agent  Indications:    CHF- Acute diastolic  History:         Patient has prior history of Echocardiogram examinations, most recent 03/14/2022. CHF, Previous Myocardial Infarction; Risk Factors:Diabetes. Hx of DVT, PE, CKD.  Sonographer:    Milda Smart Referring Phys: [id number] [facility]   Sonographer Comments: Technically difficult study due to poor echo windows. Image acquisition challenging due to patient body habitus and Image acquisition challenging due to respiratory motion. IMPRESSIONS   1. Left ventricular ejection fraction, by estimation, is 60 to 65%. The left ventricle has normal function. The left ventricle has no regional wall motion abnormalities. There is mild concentric left ventricular hypertrophy. Left ventricular diastolic parameters are indeterminate. Elevated left ventricular end-diastolic pressure. 2. Right ventricular systolic function was not well visualized. The right ventricular size is normal. 3. The mitral valve is normal in structure. No evidence of mitral valve regurgitation. No evidence of mitral stenosis. 4. The aortic valve is tricuspid. Aortic valve regurgitation is trivial. Aortic valve sclerosis/calcification is present, without any evidence of aortic stenosis. 5. The inferior vena cava is normal in size with greater than 50% respiratory variability, suggesting right atrial pressure of 3 mmHg. 6. Ascending aorta measurements are within normal limits for age when indexed to body surface area.  FINDINGS Left Ventricle: Left ventricular ejection fraction, by estimation, is 60 to 65%. The left ventricle has normal function. The left ventricle has no regional wall motion abnormalities. Definity contrast agent was given IV to delineate the left ventricular endocardial borders. The left ventricular internal cavity size was normal in size. There is mild concentric left ventricular hypertrophy. Left ventricular diastolic parameters are indeterminate. Elevated left ventricular end-diastolic pressure.  Right Ventricle: The  right ventricular size is normal. No increase in right ventricular wall thickness. Right ventricular systolic function was not well visualized.  Left Atrium: Left atrial size was normal in size.  Right Atrium: Right atrial size was normal in size.  Pericardium: There is no evidence of pericardial effusion.  Mitral Valve: The mitral valve is normal in structure. No evidence of mitral valve regurgitation. No evidence of mitral valve stenosis. MV peak gradient, 4.8 mmHg. The mean mitral valve gradient is 2.0 mmHg.  Tricuspid Valve: The tricuspid valve is normal in structure. Tricuspid valve regurgitation is trivial. No evidence of tricuspid stenosis.  Aortic Valve: The aortic valve is tricuspid. Aortic valve regurgitation is trivial. Aortic valve sclerosis/calcification is present, without any evidence of aortic stenosis.  Pulmonic Valve: The pulmonic valve was normal in structure. Pulmonic valve regurgitation is mild. No evidence of pulmonic stenosis.  Aorta: The aortic root is normal in size and structure. Ascending aorta measurements are within normal limits for age when indexed to body surface area.  Venous: The inferior vena cava is normal in size with greater than 50% respiratory variability, suggesting right atrial pressure of 3 mmHg.  IAS/Shunts: No atrial level shunt detected by color flow Doppler.   LEFT VENTRICLE PLAX 2D LVIDd:  Cardiology Office Note:  .   Date:  02/22/2023  ID:  [name], DOB March 27, 1951, MRN [id number] PCP: Everrett Coombe, DO  [facility] Providers Cardiologist:  Chrystie Nose, MD    History of Present Illness: .   [name] is a 72 y.o. male with a hx of hypertension, DM2, nonobstructive CAD, autoimmune myositis, PTSD, PE, DVT, HFrEF, CKD 3.   He was admitted 07/21/2021 and acute PE as well as acute RLE DVT with acute hypoxic respiratory failure in the setting of testosterone injections were discontinued.  Started on heparin and transitioned to Eliquis.  Recommended for indefinite OAC.  Echo  07/23/2021 with LVEF 35-40%, wall motion abnormalities noted, grade 1 diastolic dysfunction, trivial MR, mild AI, mild dilation ascending aorta 41 mm.  Due to AKI he was recommended for outpatient ischemic evaluation.   Seen 08/14/2021 Imdur discontinued due to patient report of headache.  Cardiac CTA 08/28/2021 coronary calcium score of 129 placing him in the 64th percentile for age, gender. LM 1-24% stenosis and ostial/prox LAD mild 25-49% stenosis (FFR 0.88 prox and 0.82 mid - no significant stenosis). For optimization of therapy, 10/09/21 Spironolactone added and 10/27/21 Hydralazine transitioned to [place]. Repeat echo 12/20/21 LVEF 55-60%, stable wall motion abnormalities, gr1DD.   Underwent sleep study 02/20/2022 with moderate OSA recommended for CPAP titration.   Admitted 11/12 - 03/15/2022 with AMS found to have pneumonia as well as heart failure exacerbation requiring diuresis.  He was discharged on torsemide.  Entresto discontinued due to hypotension.  Carvedilol dose reduced.  Echo during that admission with LVEF 55 to 60%, grade 1 diastolic dysfunction, aortic valve sclerosis without stenosis, mild dilation aortic root 41 mm.     Readmitted 11/20 - 03/24/2022 with acute metabolic encephalopathy with hypoglycemia, dehydration.  No evidence of infection.  Spironolactone, torsemide were  discontinued.   He saw PCP 03/29/2022 with Torsemide was resumed. At follow up 04/06/22 Torsemide increased to 20mg  QD due to bilateral LE 2+ pitting edema. He was set up for autoPAP.    Seen 06/05/2022 and due to bilateral 2+ pitting edema recommended torsemide 40 mg daily for 3 days then return to 20 mg daily.  Spironolactone 12.5 mg was added.  However he did not stay on long-term.   Admitted 5/15-5/18/24 with LOC seizure vs syncope. CT/MRI brain unremarkable. EEG no seizures. Workup did reveal various degrees of foraminal stenosis recommended for outpatient neurosurgery evaluation. Echo 09/13/22 LVEF 60-65%, no RWMA, mild LVH, no significant valvular abnomalities.    He presents today for follow-up with his wife. His 52 year old son is a Holiday representative at [employer] with goal of being an MD. Reports intermittent dyspnea taking only a minute to recover. Some coughing which wife attributes to allergies. Has been taking Zyrtec. No wheezing. Recently started Ozempic with endocrinology, encouraged to continue to hydrate and eat protein at meal times. No chest pain, orthopnea, PND. Edema has been stable followed by wound clinic, wearing compression wraps, and using lymphedema pump.   ROS: Please see the history of present illness.    All other systems reviewed and are negative.   Studies Reviewed: .        Cardiac Studies & Procedures       ECHOCARDIOGRAM  ECHOCARDIOGRAM COMPLETE 09/13/2022  Narrative ECHOCARDIOGRAM REPORT    Patient Name:   [name] Date of Exam: 09/13/2022 Medical Rec #:  [id number]   Height:       68.0 in Accession #:    [id number]  Weight:  219.8 lb Date of Birth:  1951-01-04    BSA:          2.127 m Patient Age:    71 years    BP:           102/77 mmHg Patient Gender: M           HR:           73 bpm. Exam Location:  Inpatient  Procedure: 2D Echo, Cardiac Doppler, Color Doppler and Intracardiac Opacification Agent  Indications:    CHF- Acute diastolic  History:         Patient has prior history of Echocardiogram examinations, most recent 03/14/2022. CHF, Previous Myocardial Infarction; Risk Factors:Diabetes. Hx of DVT, PE, CKD.  Sonographer:    Milda Smart Referring Phys: [id number] [facility]   Sonographer Comments: Technically difficult study due to poor echo windows. Image acquisition challenging due to patient body habitus and Image acquisition challenging due to respiratory motion. IMPRESSIONS   1. Left ventricular ejection fraction, by estimation, is 60 to 65%. The left ventricle has normal function. The left ventricle has no regional wall motion abnormalities. There is mild concentric left ventricular hypertrophy. Left ventricular diastolic parameters are indeterminate. Elevated left ventricular end-diastolic pressure. 2. Right ventricular systolic function was not well visualized. The right ventricular size is normal. 3. The mitral valve is normal in structure. No evidence of mitral valve regurgitation. No evidence of mitral stenosis. 4. The aortic valve is tricuspid. Aortic valve regurgitation is trivial. Aortic valve sclerosis/calcification is present, without any evidence of aortic stenosis. 5. The inferior vena cava is normal in size with greater than 50% respiratory variability, suggesting right atrial pressure of 3 mmHg. 6. Ascending aorta measurements are within normal limits for age when indexed to body surface area.  FINDINGS Left Ventricle: Left ventricular ejection fraction, by estimation, is 60 to 65%. The left ventricle has normal function. The left ventricle has no regional wall motion abnormalities. Definity contrast agent was given IV to delineate the left ventricular endocardial borders. The left ventricular internal cavity size was normal in size. There is mild concentric left ventricular hypertrophy. Left ventricular diastolic parameters are indeterminate. Elevated left ventricular end-diastolic pressure.  Right Ventricle: The  right ventricular size is normal. No increase in right ventricular wall thickness. Right ventricular systolic function was not well visualized.  Left Atrium: Left atrial size was normal in size.  Right Atrium: Right atrial size was normal in size.  Pericardium: There is no evidence of pericardial effusion.  Mitral Valve: The mitral valve is normal in structure. No evidence of mitral valve regurgitation. No evidence of mitral valve stenosis. MV peak gradient, 4.8 mmHg. The mean mitral valve gradient is 2.0 mmHg.  Tricuspid Valve: The tricuspid valve is normal in structure. Tricuspid valve regurgitation is trivial. No evidence of tricuspid stenosis.  Aortic Valve: The aortic valve is tricuspid. Aortic valve regurgitation is trivial. Aortic valve sclerosis/calcification is present, without any evidence of aortic stenosis.  Pulmonic Valve: The pulmonic valve was normal in structure. Pulmonic valve regurgitation is mild. No evidence of pulmonic stenosis.  Aorta: The aortic root is normal in size and structure. Ascending aorta measurements are within normal limits for age when indexed to body surface area.  Venous: The inferior vena cava is normal in size with greater than 50% respiratory variability, suggesting right atrial pressure of 3 mmHg.  IAS/Shunts: No atrial level shunt detected by color flow Doppler.   LEFT VENTRICLE PLAX 2D LVIDd:  219.8 lb Date of Birth:  1951-01-04    BSA:          2.127 m Patient Age:    71 years    BP:           102/77 mmHg Patient Gender: M           HR:           73 bpm. Exam Location:  Inpatient  Procedure: 2D Echo, Cardiac Doppler, Color Doppler and Intracardiac Opacification Agent  Indications:    CHF- Acute diastolic  History:         Patient has prior history of Echocardiogram examinations, most recent 03/14/2022. CHF, Previous Myocardial Infarction; Risk Factors:Diabetes. Hx of DVT, PE, CKD.  Sonographer:    Milda Smart Referring Phys: [id number] [facility]   Sonographer Comments: Technically difficult study due to poor echo windows. Image acquisition challenging due to patient body habitus and Image acquisition challenging due to respiratory motion. IMPRESSIONS   1. Left ventricular ejection fraction, by estimation, is 60 to 65%. The left ventricle has normal function. The left ventricle has no regional wall motion abnormalities. There is mild concentric left ventricular hypertrophy. Left ventricular diastolic parameters are indeterminate. Elevated left ventricular end-diastolic pressure. 2. Right ventricular systolic function was not well visualized. The right ventricular size is normal. 3. The mitral valve is normal in structure. No evidence of mitral valve regurgitation. No evidence of mitral stenosis. 4. The aortic valve is tricuspid. Aortic valve regurgitation is trivial. Aortic valve sclerosis/calcification is present, without any evidence of aortic stenosis. 5. The inferior vena cava is normal in size with greater than 50% respiratory variability, suggesting right atrial pressure of 3 mmHg. 6. Ascending aorta measurements are within normal limits for age when indexed to body surface area.  FINDINGS Left Ventricle: Left ventricular ejection fraction, by estimation, is 60 to 65%. The left ventricle has normal function. The left ventricle has no regional wall motion abnormalities. Definity contrast agent was given IV to delineate the left ventricular endocardial borders. The left ventricular internal cavity size was normal in size. There is mild concentric left ventricular hypertrophy. Left ventricular diastolic parameters are indeterminate. Elevated left ventricular end-diastolic pressure.  Right Ventricle: The  right ventricular size is normal. No increase in right ventricular wall thickness. Right ventricular systolic function was not well visualized.  Left Atrium: Left atrial size was normal in size.  Right Atrium: Right atrial size was normal in size.  Pericardium: There is no evidence of pericardial effusion.  Mitral Valve: The mitral valve is normal in structure. No evidence of mitral valve regurgitation. No evidence of mitral valve stenosis. MV peak gradient, 4.8 mmHg. The mean mitral valve gradient is 2.0 mmHg.  Tricuspid Valve: The tricuspid valve is normal in structure. Tricuspid valve regurgitation is trivial. No evidence of tricuspid stenosis.  Aortic Valve: The aortic valve is tricuspid. Aortic valve regurgitation is trivial. Aortic valve sclerosis/calcification is present, without any evidence of aortic stenosis.  Pulmonic Valve: The pulmonic valve was normal in structure. Pulmonic valve regurgitation is mild. No evidence of pulmonic stenosis.  Aorta: The aortic root is normal in size and structure. Ascending aorta measurements are within normal limits for age when indexed to body surface area.  Venous: The inferior vena cava is normal in size with greater than 50% respiratory variability, suggesting right atrial pressure of 3 mmHg.  IAS/Shunts: No atrial level shunt detected by color flow Doppler.   LEFT VENTRICLE PLAX 2D LVIDd:

## 2023-02-22 NOTE — Progress Notes (Signed)
Slough N/A N/A Necrotic Tissue: Fat Layer (Subcutaneous Tissue): Yes N/A N/A Exposed Structures: Fascia: No Tendon: No Muscle: No Joint: No Bone: No Small (1-33%) N/A N/A Epithelialization: Excoriation: No N/A N/A Periwound Skin Texture: Induration: No Callus: No Crepitus: No Rash: No Scarring: No Maceration: No N/A N/A Periwound Skin Moisture: Dry/Scaly: No Atrophie Blanche: No N/A N/A Periwound Skin Color: Cyanosis: No Ecchymosis: No Erythema: No Hemosiderin Staining: No Mottled: No Pallor: No Rubor: No Compression Therapy N/A N/A Procedures Performed: Treatment Notes Electronic Signature(s) Signed: 02/22/2023 12:59:16 PM By: Baltazar Najjar MD Entered By: Baltazar Najjar on 02/21/2023 13:51:49 -------------------------------------------------------------------------------- Multi-Disciplinary Care Plan Details Patient Name: Date of Service: [name], [name] 02/21/2023 3:15 PM Medical Record Number: [id number] Patient Account Number: [id number] Date of Birth/Sex: Treating RN: Mar 14, 1951 (72 y.o. Dianna Limbo Primary Care Lawton Dollinger:  Everrett Coombe Other Clinician: Referring Kobie Matkins: Treating Herold Salguero/Extender: Shawna Clamp in Treatment: 20 Active Inactive Nutrition Nursing Diagnoses: Potential for alteratiion in Nutrition/Potential for imbalanced nutrition Goals: Patient/caregiver agrees to and verbalizes understanding of need to obtain nutritional consultation Date Initiated: 02/14/2023 Target Resolution Date: 04/07/2023 [name], [name] ([id number]) [phone].pdf Page 4 of 7 Goal Status: Active Interventions: Assess patient nutrition upon admission and as needed per policy Provide education on nutrition Treatment Activities: Patient referred to Primary Care Physician for further nutritional evaluation : 02/14/2023 Notes: Pain, Acute or Chronic Nursing Diagnoses: Potential alteration in comfort, pain Goals: Patient will verbalize adequate pain control and receive pain control interventions during procedures as needed Date Initiated: 02/14/2023 Target Resolution Date: 04/07/2023 Goal Status: Active Interventions: Encourage patient to take pain medications as prescribed Provide education on pain management Treatment Activities: Administer pain control measures as ordered : 02/14/2023 Notes: Electronic Signature(s) Signed: 02/22/2023 2:33:15 PM By: Karie Schwalbe RN Entered By: Karie Schwalbe on 02/22/2023 11:29:23 -------------------------------------------------------------------------------- Pain Assessment Details Patient Name: Date of Service: [name], [name] 02/21/2023 3:15 PM Medical Record Number: [id number] Patient Account Number: [id number] Date of Birth/Sex: Treating RN: [birth year] (72 y.o. Dianna Limbo Primary Care Doralee Kocak: Everrett Coombe Other Clinician: Referring Steen Bisig: Treating Shaneal Barasch/Extender: Shawna Clamp in Treatment: 20 Active Problems Location of Pain Severity and Description of Pain Patient Has Paino  Yes Site Locations Pain Location: Generalized Pain With Dressing Change: Yes Duration of the Pain. Constant / Intermittento Constant Rate the pain. Current Pain Level: 10 Worst Pain Level: 10 Least Pain Level: 8 Tolerable Pain Level: 8 Character of Pain Describe the Pain: Difficult to Pinpoint Billiter, Ianmichael ([id number]) [phone].pdf Page 5 of 7 Pain Management and Medication Current Pain Management: Medication: Yes Cold Application: No Rest: Yes Massage: No Activity: No T.E.N.S.: No Heat Application: No Leg drop or elevation: No Is the Current Pain Management Adequate: Adequate How does your wound impact your activities of daily livingo Sleep: No Bathing: No Appetite: No Relationship With Others: No Bladder Continence: No Emotions: No Bowel Continence: No Work: No Toileting: No Drive: No Dressing: No Hobbies: No Electronic Signature(s) Signed: 02/22/2023 2:33:15 PM By: Karie Schwalbe RN Entered By: Karie Schwalbe on 02/21/2023 13:42:31 -------------------------------------------------------------------------------- Patient/Caregiver Education Details Patient Name: Date of Service: [name] 10/24/2024andnbsp3:15 PM Medical Record Number: [id number] Patient Account Number: [id number] Date of Birth/Gender: Treating RN: [birth year] (72 y.o. Dianna Limbo Primary Care Physician: Everrett Coombe Other Clinician: Referring Physician: Treating Physician/Extender: Shawna Clamp in Treatment: 20 Education Assessment Education Provided To: Patient Education Topics Provided Wound/Skin Impairment: Methods: Explain/Verbal Responses: State content correctly [employer]) Signed: 02/22/2023 2:33:15 PM By: Baruch Merl,  Slough N/A N/A Necrotic Tissue: Fat Layer (Subcutaneous Tissue): Yes N/A N/A Exposed Structures: Fascia: No Tendon: No Muscle: No Joint: No Bone: No Small (1-33%) N/A N/A Epithelialization: Excoriation: No N/A N/A Periwound Skin Texture: Induration: No Callus: No Crepitus: No Rash: No Scarring: No Maceration: No N/A N/A Periwound Skin Moisture: Dry/Scaly: No Atrophie Blanche: No N/A N/A Periwound Skin Color: Cyanosis: No Ecchymosis: No Erythema: No Hemosiderin Staining: No Mottled: No Pallor: No Rubor: No Compression Therapy N/A N/A Procedures Performed: Treatment Notes Electronic Signature(s) Signed: 02/22/2023 12:59:16 PM By: Baltazar Najjar MD Entered By: Baltazar Najjar on 02/21/2023 13:51:49 -------------------------------------------------------------------------------- Multi-Disciplinary Care Plan Details Patient Name: Date of Service: [name], [name] 02/21/2023 3:15 PM Medical Record Number: [id number] Patient Account Number: [id number] Date of Birth/Sex: Treating RN: Mar 14, 1951 (72 y.o. Dianna Limbo Primary Care Lawton Dollinger:  Everrett Coombe Other Clinician: Referring Kobie Matkins: Treating Herold Salguero/Extender: Shawna Clamp in Treatment: 20 Active Inactive Nutrition Nursing Diagnoses: Potential for alteratiion in Nutrition/Potential for imbalanced nutrition Goals: Patient/caregiver agrees to and verbalizes understanding of need to obtain nutritional consultation Date Initiated: 02/14/2023 Target Resolution Date: 04/07/2023 [name], [name] ([id number]) [phone].pdf Page 4 of 7 Goal Status: Active Interventions: Assess patient nutrition upon admission and as needed per policy Provide education on nutrition Treatment Activities: Patient referred to Primary Care Physician for further nutritional evaluation : 02/14/2023 Notes: Pain, Acute or Chronic Nursing Diagnoses: Potential alteration in comfort, pain Goals: Patient will verbalize adequate pain control and receive pain control interventions during procedures as needed Date Initiated: 02/14/2023 Target Resolution Date: 04/07/2023 Goal Status: Active Interventions: Encourage patient to take pain medications as prescribed Provide education on pain management Treatment Activities: Administer pain control measures as ordered : 02/14/2023 Notes: Electronic Signature(s) Signed: 02/22/2023 2:33:15 PM By: Karie Schwalbe RN Entered By: Karie Schwalbe on 02/22/2023 11:29:23 -------------------------------------------------------------------------------- Pain Assessment Details Patient Name: Date of Service: [name], [name] 02/21/2023 3:15 PM Medical Record Number: [id number] Patient Account Number: [id number] Date of Birth/Sex: Treating RN: [birth year] (72 y.o. Dianna Limbo Primary Care Doralee Kocak: Everrett Coombe Other Clinician: Referring Steen Bisig: Treating Shaneal Barasch/Extender: Shawna Clamp in Treatment: 20 Active Problems Location of Pain Severity and Description of Pain Patient Has Paino  Yes Site Locations Pain Location: Generalized Pain With Dressing Change: Yes Duration of the Pain. Constant / Intermittento Constant Rate the pain. Current Pain Level: 10 Worst Pain Level: 10 Least Pain Level: 8 Tolerable Pain Level: 8 Character of Pain Describe the Pain: Difficult to Pinpoint Billiter, Ianmichael ([id number]) [phone].pdf Page 5 of 7 Pain Management and Medication Current Pain Management: Medication: Yes Cold Application: No Rest: Yes Massage: No Activity: No T.E.N.S.: No Heat Application: No Leg drop or elevation: No Is the Current Pain Management Adequate: Adequate How does your wound impact your activities of daily livingo Sleep: No Bathing: No Appetite: No Relationship With Others: No Bladder Continence: No Emotions: No Bowel Continence: No Work: No Toileting: No Drive: No Dressing: No Hobbies: No Electronic Signature(s) Signed: 02/22/2023 2:33:15 PM By: Karie Schwalbe RN Entered By: Karie Schwalbe on 02/21/2023 13:42:31 -------------------------------------------------------------------------------- Patient/Caregiver Education Details Patient Name: Date of Service: [name] 10/24/2024andnbsp3:15 PM Medical Record Number: [id number] Patient Account Number: [id number] Date of Birth/Gender: Treating RN: [birth year] (72 y.o. Dianna Limbo Primary Care Physician: Everrett Coombe Other Clinician: Referring Physician: Treating Physician/Extender: Shawna Clamp in Treatment: 20 Education Assessment Education Provided To: Patient Education Topics Provided Wound/Skin Impairment: Methods: Explain/Verbal Responses: State content correctly [employer]) Signed: 02/22/2023 2:33:15 PM By: Baruch Merl,  Randa Evens RN Entered By: Karie Schwalbe on 02/22/2023 11:29:35 -------------------------------------------------------------------------------- Wound Assessment Details Patient Name: Date of  Service: [name], [name] 02/21/2023 3:15 PM Medical Record Number: [id number] Patient Account Number: [id number] Date of Birth/Sex: Treating RN: [birth year] (72 y.o. Dianna Limbo Primary Care Min Tunnell: Everrett Coombe Other Clinician: Referring Alivea Gladson: Treating Keaten Mashek/Extender: Shawna Clamp in Treatment: 20 Wound Status Wound Number: 13 Primary Lymphedema Etiology: [name], [name] ([id number]) [id number].pdf Page 6 of 7 Etiology: Wound Location: Right, Anterior Lower Leg Wound Open Wounding Event: Blister Status: Date Acquired: 02/07/2023 Comorbid Anemia, Lymphedema, Congestive Heart Failure, Deep Vein Weeks Of Treatment: 1 History: Thrombosis, Type II Diabetes, Rheumatoid Arthritis Clustered Wound: No Photos Wound Measurements Length: (cm) 2.7 Width: (cm) 2.6 Depth: (cm) 0.1 Area: (cm) 5.513 Volume: (cm) 0.551 % Reduction in Area: 15.6% % Reduction in Volume: 15.6% Epithelialization: Small (1-33%) Tunneling: No Undermining: No Wound Description Classification: Full Thickness Without Exposed Support Structures Wound Margin: Distinct, outline attached Exudate Amount: Medium Exudate Type: Serosanguineous Exudate Color: red, brown Foul Odor After Cleansing: No Slough/Fibrino No Wound Bed Granulation Amount: Large (67-100%) Exposed Structure Granulation Quality: Red Fascia Exposed: No Necrotic Amount: Small (1-33%) Fat Layer (Subcutaneous Tissue) Exposed: Yes Necrotic Quality: Eschar, Adherent Slough Tendon Exposed: No Muscle Exposed: No Joint Exposed: No Bone Exposed: No Periwound Skin Texture Texture Color No Abnormalities Noted: No No Abnormalities Noted: No Callus: No Atrophie Blanche: No Crepitus: No Cyanosis: No Excoriation: No Ecchymosis: No Induration: No Erythema: No Rash: No Hemosiderin Staining: No Scarring: No Mottled: No Pallor: No Moisture Rubor: No No Abnormalities Noted: No Dry /  Scaly: No Maceration: No Treatment Notes Wound #13 (Lower Leg) Wound Laterality: Right, Anterior Cleanser Soap and Water Discharge Instruction: May shower and wash wound with dial antibacterial soap and water prior to dressing change. Vashe 5.8 (oz) Discharge Instruction: Cleanse the wound with Vashe prior to applying a clean dressing using gauze sponges, not tissue or cotton balls. Peri-Wound Care Sween Lotion (Moisturizing lotion) Discharge Instruction: Apply moisturizing lotion as directed Topical Styer, Colson ([id number]) [id number] Page 7 of 7 Primary Dressing Hydrofera Blue Ready Transfer Foam, 2.5x2.5 (in/in) Discharge Instruction: Apply directly to wound bed as directed Secondary Dressing ABD Pad, 8x10 Discharge Instruction: Apply over primary dressing as directed. Secured With Compression Wrap Urgo K2, (equivalent to a 4 layer) two layer compression system, regular Discharge Instruction: Apply Urgo K2 as directed (alternative to 4 layer compression). Compression Stockings Add-Ons Electronic Signature(s) Signed: 02/22/2023 2:33:15 PM By: Karie Schwalbe RN Entered By: Karie Schwalbe on 02/21/2023 13:25:11 -------------------------------------------------------------------------------- Vitals Details Patient Name: Date of Service: [name], [name] 02/21/2023 3:15 PM Medical Record Number: [id number] Patient Account Number: [id number] Date of Birth/Sex: Treating RN: 10-23-1950 (72 y.o. Dianna Limbo Primary Care Akia Desroches: Everrett Coombe Other Clinician: Referring Raymundo Rout: Treating Shamiyah Ngu/Extender: Shawna Clamp in Treatment: 20 Vital Signs Time Taken: 16:28 Temperature (F): 97.4 Height (in): 68 Pulse (bpm): 79 Weight (lbs): 220 Respiratory Rate (breaths/min): 20 Body Mass Index (BMI): 33.4 Blood Pressure (mmHg): 150/74 Reference Range: 80 - 120 mg / dl Electronic Signature(s) Signed: 02/22/2023 2:33:15 PM  By: Karie Schwalbe RN Entered By: Karie Schwalbe on 02/21/2023 13:28:54

## 2023-02-25 LAB — COMPREHENSIVE METABOLIC PANEL
ALT: 16 [IU]/L (ref 0–44)
AST: 18 [IU]/L (ref 0–40)
Albumin: 4.3 g/dL (ref 3.8–4.8)
Alkaline Phosphatase: 84 [IU]/L (ref 44–121)
BUN/Creatinine Ratio: 17 (ref 10–24)
BUN: 23 mg/dL (ref 8–27)
Bilirubin Total: 0.4 mg/dL (ref 0.0–1.2)
CO2: 22 mmol/L (ref 20–29)
Calcium: 10.5 mg/dL — ABNORMAL HIGH (ref 8.6–10.2)
Chloride: 106 mmol/L (ref 96–106)
Creatinine, Ser: 1.35 mg/dL — ABNORMAL HIGH (ref 0.76–1.27)
Globulin, Total: 2.1 g/dL (ref 1.5–4.5)
Glucose: 106 mg/dL — ABNORMAL HIGH (ref 70–99)
Potassium: 4.7 mmol/L (ref 3.5–5.2)
Sodium: 143 mmol/L (ref 134–144)
Total Protein: 6.4 g/dL (ref 6.0–8.5)
eGFR: 55 mL/min/{1.73_m2} — ABNORMAL LOW (ref >59)

## 2023-02-25 LAB — LDL CHOLESTEROL, DIRECT: LDL Direct: 152 mg/dL — ABNORMAL HIGH (ref 0–99)

## 2023-02-25 LAB — BRAIN NATRIURETIC PEPTIDE: BNP: 46 pg/mL (ref 0.0–100.0)

## 2023-02-26 ENCOUNTER — Telehealth (HOSPITAL_BASED_OUTPATIENT_CLINIC_OR_DEPARTMENT_OTHER): Payer: Self-pay

## 2023-02-26 NOTE — Telephone Encounter (Addendum)
Left message for patient to call back   ----- Message from [name] sent at 02/26/2023  6:19 AM EDT ----- Please contact [name] and let him know that his blood work has been reviewed.  His creatinine is stable at 1.35.  His calcium was slightly elevated at 10.5.  His direct LDL was above goal at 152.  We will resume his atorvastatin at 40 mg daily.  Please order repeat fasting lipids and LFTs to be drawn in 6 to 8 weeks.  His BNP is normal at this time.  Thank you.

## 2023-02-26 NOTE — Progress Notes (Addendum)
[name], [name] ([id number]) [id number] Page 1 of 7 Visit Report for 02/21/2023 HPI Details Patient Name: Date of Service: [name], [name] 02/21/2023 3:15 PM Medical Record Number: [id number] Patient Account Number: [id number] Date of Birth/Sex: Treating RN: [birth year] (72 y.o. M) Primary Care Provider: Everrett Coombe Other Clinician: Referring Provider: Treating Provider/Extender: Shawna Clamp in Treatment: 20 History of Present Illness HPI Description: ADMISSION 10/04/2022 This is a 72 year old man who is quite disabled because of some form of myopathy. He follows for this with neurology. He has had biopsies that showed necrosis without inflammation. 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 In any case he is reasonably immobile at home. He has this apparatus to help him transfer. Because of difficulties getting him upstairs he has been sleeping in a recliner on the main floor. He also has a history of lymphedema and I see he was followed by PT for a period of time. He has a history of combined congestive heart failure but his last echo showed an EF of 60 to 65%. He has developed a wound on the right lateral leg over the last month or so. He also has a smaller wound on the right anterior lower leg. He is having problems with his left second toe and he has bilateral stage II ulcers over his ischial tuberosity. The patient is complaining of oral pain under his tongue. He has bilateral heel pain and increasing edema with weight gain over the past week or 2 Past medical history includes myopathy/myositis question inclusion body myositis, rheumatoid arthritis, coronary artery disease, massive pulmonary embolism, type 2 diabetes, lymphedema, combined congestive heart failure, bilateral foot drop PTSD ABIs in our clinic were 1.22 on the right 1.27 on the  left 6/17; patient presents for follow-up. He has been using silver alginate to the bilateral buttocks wounds. These have healed. We have also been using silver alginate to the right lower extremity under compression therapy. He is tolerated this well. The wounds are smaller. 6/25; patient presents for follow-up. We have been using silver alginate to the right lower extremity under compression therapy. The anterior leg wound has healed. The lateral right leg wound is smaller. 7/8; patient presents for follow-up. We have been using silver alginate under compression therapy to the right lower extremity wound and this is healed. Unfortunately he has developed a blister to the left lower extremity. 7/15; patient presents for follow-up. We have been using silver alginate under 4-layer compression to the left lower extremity. His wound has healed here. Unfortunately he has developed blistering to the right lower extremity and has open wounds despite using compression therapy daily here. 7/22; patient presents for follow-up. We have been using silver alginate under 4-layer compression to the right lower extremity. He has been using his zipper compression wrap to the left lower extremity daily. 2 wounds have healed on the right lower extremity. He has 1 remaining wound to the anterior aspect. He has no issues or complaints today. 8/2; patient presents for follow-up. We have been using silver alginate under Urgo K2 to the right lower extremity. His wounds have healed. He has zipper compression stockings to use. We have ordered him thigh-high Velcro compression wraps that are arriving in 2 weeks. 8/12; patient presents for follow-up. Unfortunately he has reopened wounds on both lower extremities. He had to stop his diuretic due to low blood pressure And now his lower  extremities are more edematous.. He has received his thigh-high Velcro compression wraps and lymphedema pumps. 8/22; patient presents for  follow-up. We have been using silver alginate under 4-layer compression to the lower extremities bilaterally. His wounds are smaller. He has no issues or complaints today. 8/27; patient presents for follow-up. We have been using silver alginate with antibiotic ointment under 4-layer equivalent compression wraps to the lower extremities bilaterally. The right lower extremity wound has healed. He still has a small open wound to the left lower extremity. He has his juxta fits with him today. This will be applied to the right leg. 9/5; patient presents for follow-up. He has been wearing juxta fits to the right lower extremity. Unfortunately he developed a blister with a new wound to the this leg. We have been using silver alginate with antibiotic ointment under 4-layer compression to the left lower extremity. The left lower extremity wound has healed. 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 He is on Eliquis and reports taking this daily. 10/8; the patient has no open wound on the right leg. He developed a new blister on the left leg which morphed into a new wound sometime in August although he was seen here on 9/5. He has juxta fit stockings and compression pumps but they tell me somebody told them not to use the compression pumps over stockings. I have never really heard this. In any case he is now here with a unroofed blister type wound on the left anterior mid tibia. 10/17; the patient was discharged with no open wound on the right leg however he developed a new open blister on the right leg now has a open wound in the mid part of the right leg. The area on the left leg is closed. He has systemic edema the cause of this is not really clear. 10/24; the patient has his 1 remaining wound on the right mid tibia. Nothing open on the left leg. Comes in with what his wife claims that his thigh-high external compression garments. He has a  Tubigrip under this. The compression garment running from his mid tibia to above his knee. She is insistent that we ordered these and put them on the last time. [name], [name] ([id number]) [id number] Page 2 of 7 We have been using Hydrofera Blue according to my notes although it looks as though this patient had adherent silver alginate on the right leg wound. This was under a Urgo K2 wrap that was maintained Electronic Signature(s) Signed: 02/22/2023 12:59:16 PM By: Baltazar Najjar MD Entered By: Baltazar Najjar on 02/21/2023 13:53:03 -------------------------------------------------------------------------------- Physical Exam Details Patient Name: Date of Service: [name], [name] 02/21/2023 3:15 PM Medical Record Number: [id number] Patient Account Number: [id number] Date of Birth/Sex: Treating RN: 04-May-1950 (72 y.o. M) Primary Care Provider: Everrett Coombe Other Clinician: Referring Provider: Treating Provider/Extender: Shawna Clamp in Treatment: 20 Constitutional Patient is hypertensive.. Pulse regular and within target range for patient.Marland Kitchen Respirations regular, non-labored and within target range.. Temperature is normal and within the target range for the patient.Marland Kitchen Appears in no distress. Notes Wound exam; left lower extremity we did look at this however the leg was wrapped from about the upper third of his lower leg to just above the knee. His wife is insistent that this is a thigh-high compression wrap. Under this we had Tubigrip. They say there is no open wound here we did not look at  this leg On the right he has a small open wound in the mid tibia area this is clean adherent what looks like silver alginate I removed with a #3 curette tiny open wound. Edema control in the leg is good Electronic Signature(s) Signed: 02/22/2023 12:59:16 PM By: Baltazar Najjar MD Entered By: Baltazar Najjar on 02/21/2023  13:54:37 -------------------------------------------------------------------------------- Physician Orders Details Patient Name: Date of Service: [name], [name] 02/21/2023 3:15 PM Medical Record Number: [id number] Patient Account Number: [id number] Date of Birth/Sex: Treating RN: 02-18-1951 (72 y.o. Dianna Limbo Primary Care Provider: Everrett Coombe Other Clinician: Referring Provider: Treating Provider/Extender: Shawna Clamp in Treatment: 20 Verbal / Phone Orders: No Diagnosis Coding Follow-up Appointments ppointment in 1 week. - Dr. Leanord Hawking 03/05/23 at 3pm Return A ppointment in 2 weeks. - Dr. Leanord Hawking Please make appt Tuesday Return A Nurse Visit: - 03/01/23 at 10:45am Other: - wear juxtafit HD to whole to left leg. Wear thigh juxtafit to right thigh. Anesthetic (In clinic) Topical Lidocaine 4% applied to wound bed - In clinic Bathing/ Shower/ Hygiene May shower with protection but do not get wound dressing(s) wet. Protect dressing(s) with water repellant cover (for example, large plastic bag) or a cast cover and may then take shower. Edema Control - Lymphedema / SCD / Other Bilateral Lower Extremities Lymphedema Pumps. Use Lymphedema pumps on leg(s) 2-3 times a day for 45-60 minutes. If wearing any wraps or hose, do not remove them. Continue exercising as instructed. - Pump twice per day for one hour each time minimum over your wraps. Elevate legs to the level of the heart or above for 30 minutes daily and/or when sitting for 3-4 times a day throughout the day. [name], [name] ([id number]) [id number] Page 3 of 7 Avoid standing for long periods of time. Patient to wear own compression stockings every day. Exercise regularly Moisturize legs daily. Compression stocking or Garment 30-40 mm/Hg pressure to: - thigh high compression stockings- Circaid Juxtafit whole leg left leg. Wear daily Other Edema Control Orders/Instructions: -  Please weigh yourself every other day. ****Apply tubigrip size E double layer to left leg until you arrive at home start wearing juxtafit HD to whole leg. Wound Treatment Wound #13 - Lower Leg Wound Laterality: Right, Anterior Cleanser: Soap and Water 1 x Per Week/30 Days Discharge Instructions: May shower and wash wound with dial antibacterial soap and water prior to dressing change. Cleanser: Vashe 5.8 (oz) 1 x Per Week/30 Days Discharge Instructions: Cleanse the wound with Vashe prior to applying a clean dressing using gauze sponges, not tissue or cotton balls. Peri-Wound Care: Sween Lotion (Moisturizing lotion) 1 x Per Week/30 Days Discharge Instructions: Apply moisturizing lotion as directed Prim Dressing: Hydrofera Blue Ready Transfer Foam, 2.5x2.5 (in/in) 1 x Per Week/30 Days ary Discharge Instructions: Apply directly to wound bed as directed Secondary Dressing: ABD Pad, 8x10 1 x Per Week/30 Days Discharge Instructions: Apply over primary dressing as directed. Compression Wrap: Urgo K2, (equivalent to a 4 layer) two layer compression system, regular 1 x Per Week/30 Days Discharge Instructions: Apply Urgo K2 as directed (alternative to 4 layer compression). Electronic Signature(s) Signed: 02/22/2023 12:59:16 PM By: Baltazar Najjar MD Signed: 02/22/2023 2:33:15 PM By: Karie Schwalbe RN Entered By: Karie Schwalbe on 02/21/2023 13:45:19 -------------------------------------------------------------------------------- Problem List Details Patient Name: Date of Service: KRESTON, AHRENDT 02/21/2023 3:15 PM Medical Record Number: [id number] Patient Account Number: [id number] Date of Birth/Sex: Treating RN: 09/13/1950 (72 y.o. M) Primary Care Provider: Everrett Coombe Other Clinician: Referring Provider: Treating  Provider/Extender: Shawna Clamp in Treatment: 20 Active Problems ICD-10 Encounter Code Description Active Date MDM Diagnosis I87.312 Chronic venous  hypertension (idiopathic) with ulcer of left lower extremity 11/05/2022 No Yes I87.311 Chronic venous hypertension (idiopathic) with ulcer of right lower extremity 11/19/2022 No Yes S81.802A Unspecified open wound, left lower leg, initial encounter 11/05/2022 No Yes S81.801A Unspecified open wound, right lower leg, initial encounter 12/10/2022 No Yes I89.0 Lymphedema, not elsewhere classified 10/04/2022 No Yes Khalsa, Schyler ([id number]) [id number] Page 4 of 7 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, not elsewhere classified 10/04/2022 No Yes Inactive Problems Resolved Problems ICD-10 Code Description Active Date Resolved Date L89.312 Pressure ulcer of right buttock, stage 2 10/04/2022 10/04/2022 L89.322 Pressure ulcer of left buttock, stage 2 10/04/2022 10/04/2022 L97.818 Non-pressure chronic ulcer of other part of right lower leg with other specified severity 10/04/2022 10/04/2022 B37.0 Candidal stomatitis 10/04/2022 10/04/2022 Electronic Signature(s) Signed: 02/22/2023 12:59:16 PM By: Baltazar Najjar MD Entered By: Baltazar Najjar on 02/21/2023 13:51:43 -------------------------------------------------------------------------------- Progress Note Details Patient Name: Date of Service: RAJ, LANDRESS 02/21/2023 3:15 PM Medical Record Number: [id number] Patient Account Number: [id number] Date of Birth/Sex: Treating RN: 04/21/1951 (72 y.o. M) Primary Care Provider: Everrett Coombe Other Clinician: Referring Provider: Treating Provider/Extender: Shawna Clamp in Treatment: 20 Subjective History of Present Illness (HPI) ADMISSION 10/04/2022 This is a 72 year old man who is quite disabled because of some form of myopathy. He follows for this with neurology. He has had biopsies that showed necrosis without inflammation. 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  In any case he is reasonably immobile at home. He has this apparatus to help him transfer. Because of difficulties getting him upstairs he has been sleeping in a recliner on the main floor. He also has a history of lymphedema and I see he was followed by PT for a period of time. He has a history of combined congestive heart failure but his last echo showed an EF of 60 to 65%. He has developed a wound on the right lateral leg over the last month or so. He also has a smaller wound on the right anterior lower leg. He is having problems with his left second toe and he has bilateral stage II ulcers over his ischial tuberosity. The patient is complaining of oral pain under his tongue. He has bilateral heel pain and increasing edema with weight gain over the past week or 2 Past medical history includes myopathy/myositis question inclusion body myositis, rheumatoid arthritis, coronary artery disease, massive pulmonary embolism, type 2 diabetes, lymphedema, combined congestive heart failure, bilateral foot drop PTSD ABIs in our clinic were 1.22 on the right 1.27 on the left 6/17; patient presents for follow-up. He has been using silver alginate to the bilateral buttocks wounds. These have healed. We have also been using silver alginate to the right lower extremity under compression therapy. He is tolerated this well. The wounds are smaller. 6/25; patient presents for follow-up. We have been using silver alginate to the right lower extremity under compression therapy. The anterior leg wound has healed. The lateral right leg wound is smaller. 7/8; patient presents for follow-up. We have been using silver alginate under compression therapy to the right lower extremity wound and this is healed. Unfortunately he has developed a blister to the left lower extremity. 7/15; patient presents for follow-up. We have been using silver alginate under 4-layer compression to  the left lower extremity. His wound has healed  here. Unfortunately he has developed blistering to the right lower extremity and has open wounds despite using compression therapy daily here. [name], [name] ([id number]) [id number] Page 5 of 7 7/22; patient presents for follow-up. We have been using silver alginate under 4-layer compression to the right lower extremity. He has been using his zipper compression wrap to the left lower extremity daily. 2 wounds have healed on the right lower extremity. He has 1 remaining wound to the anterior aspect. He has no issues or complaints today. 8/2; patient presents for follow-up. We have been using silver alginate under Urgo K2 to the right lower extremity. His wounds have healed. He has zipper compression stockings to use. We have ordered him thigh-high Velcro compression wraps that are arriving in 2 weeks. 8/12; patient presents for follow-up. Unfortunately he has reopened wounds on both lower extremities. He had to stop his diuretic due to low blood pressure And now his lower extremities are more edematous.. He has received his thigh-high Velcro compression wraps and lymphedema pumps. 8/22; patient presents for follow-up. We have been using silver alginate under 4-layer compression to the lower extremities bilaterally. His wounds are smaller. He has no issues or complaints today. 8/27; patient presents for follow-up. We have been using silver alginate with antibiotic ointment under 4-layer equivalent compression wraps to the lower extremities bilaterally. The right lower extremity wound has healed. He still has a small open wound to the left lower extremity. He has his juxta fits with him today. This will be applied to the right leg. 9/5; patient presents for follow-up. He has been wearing juxta fits to the right lower extremity. Unfortunately he developed a blister with a new wound to the this leg. We have been using silver alginate with antibiotic ointment under 4-layer  compression to the left lower extremity. The left lower extremity wound has healed. 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 He is on Eliquis and reports taking this daily. 10/8; the patient has no open wound on the right leg. He developed a new blister on the left leg which morphed into a new wound sometime in August although he was seen here on 9/5. He has juxta fit stockings and compression pumps but they tell me somebody told them not to use the compression pumps over stockings. I have never really heard this. In any case he is now here with a unroofed blister type wound on the left anterior mid tibia. 10/17; the patient was discharged with no open wound on the right leg however he developed a new open blister on the right leg now has a open wound in the mid part of the right leg. The area on the left leg is closed. He has systemic edema the cause of this is not really clear. 10/24; the patient has his 1 remaining wound on the right mid tibia. Nothing open on the left leg. Comes in with what his wife claims that his thigh-high external compression garments. He has a Tubigrip under this. The compression garment running from his mid tibia to above his knee. She is insistent that we ordered these and put them on the last time. We have been using Hydrofera Blue according to my notes although it looks as though this patient had adherent silver alginate on the right leg wound. This was under a Urgo K2 wrap that was maintained Objective  Constitutional Patient is hypertensive.. Pulse regular and within target range for patient.Marland Kitchen Respirations regular, non-labored and within target range.. Temperature is normal and within the target range for the patient.Marland Kitchen Appears in no distress. Vitals Time Taken: 4:28 PM, Height: 68 in, Weight: 220 lbs, BMI: 33.4, Temperature: 97.4 F, Pulse: 79 bpm, Respiratory Rate: 20 breaths/min, Blood  Pressure: 150/74 mmHg. General Notes: Wound exam; left lower extremity we did look at this however the leg was wrapped from about the upper third of his lower leg to just above the knee. His wife is insistent that this is a thigh-high compression wrap. Under this we had Tubigrip. They say there is no open wound here we did not look at this leg  On the right he has a small open wound in the mid tibia area this is clean adherent what looks like silver alginate I removed with a #3 curette tiny open wound. Edema control in the leg is good Integumentary (Hair, Skin) Wound #13 status is Open. Original cause of wound was Blister. The date acquired was: 02/07/2023. The wound has been in treatment 1 weeks. The wound is located on the Right,Anterior Lower Leg. The wound measures 2.7cm length x 2.6cm width x 0.1cm depth; 5.513cm^2 area and 0.551cm^3 volume. There is Fat Layer (Subcutaneous Tissue) exposed. There is no tunneling or undermining noted. There is a medium amount of serosanguineous drainage noted. The wound margin is distinct with the outline attached to the wound base. There is large (67-100%) red granulation within the wound bed. There is a small (1-33%) amount of necrotic tissue within the wound bed including Eschar and Adherent Slough. The periwound skin appearance did not exhibit: Callus, Crepitus, Excoriation, Induration, Rash, Scarring, Dry/Scaly, Maceration, Atrophie Blanche, Cyanosis, Ecchymosis, Hemosiderin Staining, Mottled, Pallor, Rubor, Erythema. Assessment Active Problems ICD-10 Chronic venous hypertension (idiopathic) with ulcer of left lower extremity Chronic venous hypertension (idiopathic) with ulcer of right lower extremity Unspecified open wound, left lower leg, initial encounter Unspecified open wound, right lower leg, initial encounter Lymphedema, not elsewhere classified Other inflammatory and immune myopathies, not elsewhere classified [name], [name] ([id number])  [id number] Page 6 of 7 Procedures Wound #13 Pre-procedure diagnosis of Wound #13 is a Lymphedema located on the Right,Anterior Lower Leg . There was a Four Layer Compression Therapy Procedure by Karie Schwalbe, RN. Post procedure Diagnosis Wound #13: Same as Pre-Procedure Plan Follow-up Appointments: Return Appointment in 1 week. - Dr. Leanord Hawking 03/05/23 at 3pm Return Appointment in 2 weeks. - Dr. Leanord Hawking Please make appt Tuesday Nurse Visit: - 03/01/23 at 10:45am Other: - wear juxtafit HD to whole to left leg. Wear thigh juxtafit to right thigh. Anesthetic: (In clinic) Topical Lidocaine 4% applied to wound bed - In clinic Bathing/ Shower/ Hygiene: May shower with protection but do not get wound dressing(s) wet. Protect dressing(s) with water repellant cover (for example, large plastic bag) or a cast cover and may then take shower. Edema Control - Lymphedema / SCD / Other: Lymphedema Pumps. Use Lymphedema pumps on leg(s) 2-3 times a day for 45-60 minutes. If wearing any wraps or hose, do not remove them. Continue exercising as instructed. - Pump twice per day for one hour each time minimum over your wraps. Elevate legs to the level of the heart or above for 30 minutes daily and/or when sitting for 3-4 times a day throughout the day. Avoid standing for long periods of time. Patient to wear own compression stockings every day. Exercise regularly Moisturize legs daily. Compression stocking or Garment 30-40 mm/Hg  pressure to: - thigh high compression stockings- Circaid Juxtafit whole leg left leg. Wear daily Other Edema Control Orders/Instructions: - Please weigh yourself every other day. ****Apply tubigrip size E double layer to left leg until you arrive at home start wearing juxtafit HD to whole leg. WOUND #13: - Lower Leg Wound Laterality: Right, Anterior Cleanser: Soap and Water 1 x Per Week/30 Days Discharge Instructions: May shower and wash wound with dial  antibacterial soap and water prior to dressing change. Cleanser: Vashe 5.8 (oz) 1 x Per Week/30 Days Discharge Instructions: Cleanse the wound with Vashe prior to applying a clean dressing using gauze sponges, not tissue or cotton balls. Peri-Wound Care: Sween Lotion (Moisturizing lotion) 1 x Per Week/30 Days Discharge Instructions: Apply moisturizing lotion as directed Prim Dressing: Hydrofera Blue Ready Transfer Foam, 2.5x2.5 (in/in) 1 x Per Week/30 Days ary Discharge Instructions: Apply directly to wound bed as directed Secondary Dressing: ABD Pad, 8x10 1 x Per Week/30 Days Discharge Instructions: Apply over primary dressing as directed. Com pression Wrap: Urgo K2, (equivalent to a 4 layer) two layer compression system, regular 1 x Per Week/30 Days Discharge Instructions: Apply Urgo K2 as directed (alternative to 4 layer compression). 1. We did not change what she had on the left leg although I had trouble understanding what this is. She says she has lower leg compression stockings and thigh-high external compression garments. She had a Tubigrip on the left leg. Fortunately no open wounds 2 on the right what looked to be silver alginate even though we have Hydrofera Blue last time. We put Hydrofera Blue on this under an Urgo wrap. #3 has an appointment with his cardiologist tomorrow Electronic Signature(s) Signed: 02/22/2023 12:59:16 PM By: Baltazar Najjar MD Entered By: Baltazar Najjar on 02/21/2023 13:56:20 -------------------------------------------------------------------------------- SuperBill Details Patient Name: Date of Service: [name], [name] 02/21/2023 Medical Record Number: [id number] Patient Account Number: [id number] Date of Birth/Sex: Treating RN: 1950-07-19 (72 y.o. M) Primary Care Provider: Everrett Coombe Other Clinician: Referring Provider: Treating Provider/Extender: Shawna Clamp in Treatment: 20 Diagnosis Coding ICD-10 Codes Lyman, [name]  ([id number]) [id number] Page 7 of 7 Code Description I87.312 Chronic venous hypertension (idiopathic) with ulcer of left lower extremity I87.311 Chronic venous hypertension (idiopathic) with ulcer of right lower extremity S81.802A Unspecified open wound, left lower leg, initial encounter S81.801A Unspecified open wound, right lower leg, initial encounter I89.0 Lymphedema, not elsewhere classified 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, not elsewhere classified Facility Procedures : CPT4 Code: [id number] Description: (Facility Use Only) [phone] - APPLY MULTLAY COMPRS LWR RT LEG ICD-10 Diagnosis Description I87.312 Chronic venous hypertension (idiopathic) with ulcer of left lower extremity I87.311 Chronic venous hypertension (idiopathic) with ulcer of  right lower extremity S81.802A Unspecified open wound, left lower leg, initial encounter S81.801A Unspecified open wound, right lower leg, initial encounter Modifier: Quantity: 1 Physician Procedures : CPT4 Code Description Modifier [id number] - WC PHYS LEVEL 3 - EST PT ICD-10 Diagnosis Description I87.312 Chronic venous hypertension (idiopathic) with ulcer of left lower extremity I87.311 Chronic venous hypertension (idiopathic) with ulcer of  right lower extremity S81.802A Unspecified open wound, left lower leg, initial encounter S81.801A Unspecified open wound, right lower leg, initial encounter Quantity: 1 Electronic Signature(s) Signed: 03/04/2023 12:59:28 PM By: Pearletha Alfred Signed: 03/04/2023 3:19:07 PM By: Baltazar Najjar MD Previous Signature: 02/22/2023 2:33:15 PM Version By: Karie Schwalbe RN Previous Signature: 02/26/2023 8:53:04 AM Version By: Baltazar Najjar MD Previous Signature: 02/22/2023 12:59:16 PM Version By: Baltazar Najjar MD Entered By: Pearletha Alfred on  03/04/2023 09:59:27 

## 2023-02-27 ENCOUNTER — Encounter (HOSPITAL_BASED_OUTPATIENT_CLINIC_OR_DEPARTMENT_OTHER): Payer: Self-pay

## 2023-02-27 ENCOUNTER — Telehealth (HOSPITAL_BASED_OUTPATIENT_CLINIC_OR_DEPARTMENT_OTHER): Payer: Self-pay

## 2023-02-27 NOTE — Addendum Note (Signed)
Addended by: Gabriel Rung on: 02/27/2023 03:15 PM   Modules accepted: Orders

## 2023-02-27 NOTE — Telephone Encounter (Signed)
-----   Message from Ronney Asters sent at 02/26/2023  6:19 AM EDT ----- Please contact [name] and let him know that his blood work has been reviewed.  His creatinine is stable at 1.35.  His calcium was slightly elevated at 10.5.  His direct LDL was above goal at 152.  We will resume his atorvastatin at 40 mg daily.  Please order repeat fasting lipids and LFTs to be drawn in 6 to 8 weeks.  His BNP is normal at this time.  Thank you.

## 2023-02-27 NOTE — Telephone Encounter (Signed)
Left message for patient to call back  

## 2023-02-28 NOTE — Progress Notes (Signed)
[id number]) [id number] Page 8 of 9 Compression Wrap Compression Stockings Add-Ons Electronic Signature(s) Signed: 02/14/2023 6:11:41 PM By: Shawn Stall RN, BSN Entered By: Shawn Stall on 02/14/2023 13:27:04 -------------------------------------------------------------------------------- Wound Assessment Details Patient Name: Date of Service: JANDIEL, INGA 02/14/2023 3:15 PM Medical Record Number: [id number] Patient Account Number: [id number] Date of Birth/Sex: Treating RN: 05/27/1950 (72 y.o. M) Primary Care Aashi Derrington: Everrett Coombe Other Clinician: Referring Vanellope Passmore: Treating Seichi Kaufhold/Extender: Shawna Clamp in Treatment: 19 Wound Status Wound Number: 13 Primary Lymphedema Etiology: Wound Location: Right, Anterior Lower Leg Wound Open Wounding Event: Blister Status: Date Acquired: 02/07/2023 Comorbid Anemia, Lymphedema, Congestive Heart Failure, Deep Vein Weeks Of Treatment: 0 History: Thrombosis, Type II Diabetes, Rheumatoid Arthritis Clustered Wound: No Photos Wound Measurements Length: (cm) 3.2 Width: (cm) 2.6 Depth: (cm) 0.1 Area: (cm) 6.535 Volume: (cm) 0.653 % Reduction in Area: % Reduction in Volume: Epithelialization: Small (1-33%) Tunneling: No Undermining: No Wound Description Classification: Full Thickness Without Exposed Suppor Wound Margin: Distinct, outline attached Exudate  Amount: Medium Exudate Type: Serosanguineous Exudate Color: red, brown t Structures Foul Odor After Cleansing: No Slough/Fibrino No Wound Bed Granulation Amount: Large (67-100%) Exposed Structure Granulation Quality: Red Fascia Exposed: No Necrotic Amount: None Present (0%) Fat Layer (Subcutaneous Tissue) Exposed: Yes Tendon Exposed: No Muscle Exposed: No Joint Exposed: No Bone Exposed: No Periwound Skin Texture Texture Color Cocker, Tremaine ([id number]) [id number] Page 9 of 9 No Abnormalities Noted: No No Abnormalities Noted: No Callus: No Atrophie Blanche: No Crepitus: No Cyanosis: No Excoriation: No Ecchymosis: No Induration: No Erythema: No Rash: No Hemosiderin Staining: No Scarring: No Mottled: No Pallor: No Moisture Rubor: No No Abnormalities Noted: No Dry / Scaly: No Maceration: No Electronic Signature(s) Signed: 02/14/2023 6:11:41 PM By: Shawn Stall RN, BSN Entered By: Shawn Stall on 02/14/2023 13:16:18 -------------------------------------------------------------------------------- Vitals Details Patient Name: Date of Service: [name] 02/14/2023 3:15 PM Medical Record Number: [id number] Patient Account Number: [id number] Date of Birth/Sex: Treating RN: 1951/03/13 (72 y.o. [name], Yvonne Kendall Primary Care Jovonne Wilton: Everrett Coombe Other Clinician: Referring Makalynn Berwanger: Treating Floyd Lusignan/Extender: Shawna Clamp in Treatment: 19 Vital Signs Time Taken: 16:14 Temperature (F): 98.1 Height (in): 68 Pulse (bpm): 92 Weight (lbs): 220 Respiratory Rate (breaths/min): 20 Body Mass Index (BMI): 33.4 Blood Pressure (mmHg): 128/73 Reference Range: 80 - 120 mg / dl Electronic Signature(s) Signed: 02/14/2023 6:11:41 PM By: Shawn Stall RN, BSN Entered By: Shawn Stall on 02/14/2023 13:14:46  verbalizes understanding of need to obtain nutritional consultation Date Initiated: 02/14/2023 Target Resolution Date: 03/08/2023 Goal Status: Active Interventions: Assess patient nutrition upon admission and as needed per policy Provide education on nutrition Treatment Activities: Patient referred to Primary Care Physician for further nutritional evaluation : 02/14/2023 Notes: Pain, Acute or  Chronic Nursing Diagnoses: Potential alteration in comfort, pain Goals: Patient will verbalize adequate pain control and receive pain control interventions during procedures as needed Date Initiated: 02/14/2023 Target Resolution Date: 03/08/2023 Goal Status: Active Interventions: Encourage patient to take pain medications as prescribed Provide education on pain management Treatment Activities: Administer pain control measures as ordered : 02/14/2023 Notes: Electronic Signature(s) Signed: 02/14/2023 6:11:41 PM By: Shawn Stall RN, BSN Entered By: Shawn Stall on 02/14/2023 13:16:53 -------------------------------------------------------------------------------- Pain Assessment Details Patient Name: Date of Service: NATHAN, PINSON 02/14/2023 3:15 PM Medical Record Number: [id number] Patient Account Number: [id number] Date of Birth/Sex: Treating RN: 1950/05/23 (72 y.o. M) Primary Care Giorgia Wahler: Everrett Coombe Other Clinician: Referring Wanisha Shiroma: Treating Jhordan Mckibben/Extender: Shawna Clamp in Treatment: 19 Active Problems Location of Pain Severity and Description of Pain Patient Has Paino No Site Locations [place], [place] ([id number]) [id number] Page 6 of 9 Pain Management and Medication Current Pain Management: Electronic Signature(s) Signed: 02/27/2023 4:41:33 PM By: Thayer Dallas Entered By: Thayer Dallas on 02/14/2023 13:02:56 -------------------------------------------------------------------------------- Patient/Caregiver Education Details Patient Name: Date of Service: MARDARIUS, DELBUONO 10/17/2024andnbsp3:15 PM Medical Record Number: [id number] Patient Account Number: [id number] Date of Birth/Gender: Treating RN: [birth year] (72 y.o. Tammy Sours Primary Care Physician: Everrett Coombe Other Clinician: Referring Physician: Treating Physician/Extender: Shawna Clamp in Treatment: 19 Education  Assessment Education Provided To: Patient Education Topics Provided Wound/Skin Impairment: Handouts: Caring for Your Ulcer Methods: Explain/Verbal Responses: Reinforcements needed Electronic Signature(s) Signed: 02/14/2023 6:11:41 PM By: Shawn Stall RN, BSN Entered By: Shawn Stall on 02/14/2023 13:17:14 -------------------------------------------------------------------------------- Wound Assessment Details Patient Name: Date of Service: ANDRIE, KRYSINSKI 02/14/2023 3:15 PM Medical Record Number: [id number] Patient Account Number: [id number] Date of Birth/Sex: Treating RN: 16-Jul-1950 (72 y.o. Tammy Sours Primary Care Jshawn Hurta: Everrett Coombe Other Clinician: [name] ([id number]) [id number] Page 7 of 9 Referring Letoya Stallone: Treating Bernese Doffing/Extender: Shawna Clamp in Treatment: 19 Wound Status Wound Number: 10R Primary Lymphedema Etiology: Wound Location: Left, Anterior Lower Leg Wound Healed - Epithelialized Wounding Event: Blister Status: Date Acquired: 12/10/2022 Comorbid Anemia, Lymphedema, Congestive Heart Failure, Deep Vein Weeks Of Treatment: 9 History: Thrombosis, Type II Diabetes, Rheumatoid Arthritis Clustered Wound: No Photos Wound Measurements Length: (cm) Width: (cm) Depth: (cm) Area: (cm) Volume: (cm) 0 % Reduction in Area: 100% 0 % Reduction in Volume: 100% 0 Epithelialization: Large (67-100%) 0 Tunneling: No 0 Undermining: No Wound Description Classification: Full Thickness Without Exposed Suppor Wound Margin: Distinct, outline attached Exudate Amount: None Present t Structures Foul Odor After Cleansing: No Slough/Fibrino No Wound Bed Granulation Amount: None Present (0%) Exposed Structure Necrotic Amount: None Present (0%) Fascia Exposed: No Fat Layer (Subcutaneous Tissue) Exposed: Yes Tendon Exposed: No Muscle Exposed: No Joint Exposed: No Bone Exposed: No Periwound Skin  Texture Texture Color No Abnormalities Noted: No No Abnormalities Noted: No Callus: No Atrophie Blanche: No Crepitus: No Cyanosis: No Excoriation: No Ecchymosis: No Induration: No Erythema: No Rash: No Hemosiderin Staining: No Scarring: No Mottled: No Pallor: No Moisture Rubor: No No Abnormalities Noted: No Dry / Scaly: No Maceration: No Treatment Notes Wound #10R (Lower Leg) Wound Laterality: Left, Anterior Cleanser Peri-Wound Care Topical Primary Dressing Secondary Dressing Secured With Dallaire, Andron (  GRIGORIY, TREICHLER ([id number]) [id number] Page 1 of 9 Visit Report for 02/14/2023 Arrival Information Details Patient Name: Date of Service: JOB, SAVARD 02/14/2023 3:15 PM Medical Record Number: [id number] Patient Account Number: [id number] Date of Birth/Sex: Treating RN: 1950/07/26 (72 y.o. M) Primary Care Lonie Newsham: Everrett Coombe Other Clinician: Referring Lash Matulich: Treating Golden Gilreath/Extender: Shawna Clamp in Treatment: 19 Visit Information History Since Last Visit Added or deleted any medications: No Patient Arrived: Dan Humphreys Any new allergies or adverse reactions: No Arrival Time: 15:43 Had a fall or experienced change in No Accompanied By: wife activities of daily living that may affect Transfer Assistance: None risk of falls: Patient Identification Verified: Yes Signs or symptoms of abuse/neglect since last visito No Secondary Verification Process Completed: Yes Hospitalized since last visit: No Patient Requires Transmission-Based Precautions: No Implantable device outside of the clinic excluding No Patient Has Alerts: Yes cellular tissue based products placed in the center Patient Alerts: ELIQUIS since last visit: Has Dressing in Place as Prescribed: Yes Has Compression in Place as Prescribed: Yes Pain Present Now: No Electronic Signature(s) Signed: 02/27/2023 4:41:33 PM By: Thayer Dallas Entered By: Thayer Dallas on 02/14/2023 13:02:46 -------------------------------------------------------------------------------- Compression Therapy Details Patient Name: Date of Service: MEHKI, CEFARATTI 02/14/2023 3:15 PM Medical Record Number: [id number] Patient Account Number: [id number] Date of Birth/Sex: Treating RN: Jul 03, 1950 (72 y.o. Tammy Sours Primary Care Cedricka Sackrider: Everrett Coombe Other Clinician: Referring Bradyn Vassey: Treating Joany Khatib/Extender: Shawna Clamp in Treatment: 19 Compression Therapy  Performed for Wound Assessment: Wound #13 Right,Anterior Lower Leg Performed By: Clinician Shawn Stall, RN Compression Type: Double Layer Post Procedure Diagnosis Same as Pre-procedure Electronic Signature(s) Signed: 02/14/2023 6:11:41 PM By: Shawn Stall RN, BSN Entered By: Shawn Stall on 02/14/2023 13:25:27 Bilello, [name] ([id number]) [id number] Page 2 of 9 -------------------------------------------------------------------------------- Encounter Discharge Information Details Patient Name: Date of Service: DREW, LUDEMAN 02/14/2023 3:15 PM Medical Record Number: [id number] Patient Account Number: [id number] Date of Birth/Sex: Treating RN: December 30, 1950 (72 y.o. Tammy Sours Primary Care Analisse Randle: Everrett Coombe Other Clinician: Referring Ethne Jeon: Treating Tashiba Timoney/Extender: Shawna Clamp in Treatment: 19 Encounter Discharge Information Items Discharge Condition: Stable Ambulatory Status: Wheelchair Discharge Destination: Home Transportation: Private Auto Accompanied By: wife Schedule Follow-up Appointment: Yes Clinical Summary of Care: Notes double layer size E to left leg. Electronic Signature(s) Signed: 02/14/2023 6:11:41 PM By: Shawn Stall RN, BSN Entered By: Shawn Stall on 02/14/2023 13:31:58 -------------------------------------------------------------------------------- Lower Extremity Assessment Details Patient Name: Date of Service: TULIO, SYRACUSE 02/14/2023 3:15 PM Medical Record Number: [id number] Patient Account Number: [id number] Date of Birth/Sex: Treating RN: 1950/09/11 (72 y.o. M) Primary Care Makaiyah Schweiger: Everrett Coombe Other Clinician: Referring Julya Alioto: Treating Jewel Mcafee/Extender: Shawna Clamp in Treatment: 19 Edema Assessment Assessed: [name]: No] [name]: No] Edema: [Left: Yes] [Right: Yes] Calf Left: Right: Point of Measurement: 37 cm From Medial Instep 48.5 cm 47  cm Ankle Left: Right: Point of Measurement: 10 cm From Medial Instep 27 cm 27.5 cm Vascular Assessment Extremity colors, hair growth, and conditions: Extremity Color: [Left:Normal] [Right:Normal] Hair Growth on Extremity: [Left:No] [Right:No] Temperature of Extremity: [Left:Warm] [Right:Warm] Capillary Refill: [Left:< 3 seconds] [Right:< 3 seconds] Dependent Rubor: [Left:No No] [Right:No No] Electronic Signature(s) Signed: 02/27/2023 4:41:33 PM By: Thayer Dallas Entered By: Thayer Dallas on 02/14/2023 13:03:22 Previti, [name] ([id number]) [id number] Page 3 of 9 -------------------------------------------------------------------------------- Multi Wound Chart Details Patient Name: Date of Service: OZIEL, BULLA 02/14/2023 3:15 PM Medical Record Number: [id number] Patient Account Number: [id number] Date of Birth/Sex: Treating RN: 28-Jul-1950 (72  verbalizes understanding of need to obtain nutritional consultation Date Initiated: 02/14/2023 Target Resolution Date: 03/08/2023 Goal Status: Active Interventions: Assess patient nutrition upon admission and as needed per policy Provide education on nutrition Treatment Activities: Patient referred to Primary Care Physician for further nutritional evaluation : 02/14/2023 Notes: Pain, Acute or  Chronic Nursing Diagnoses: Potential alteration in comfort, pain Goals: Patient will verbalize adequate pain control and receive pain control interventions during procedures as needed Date Initiated: 02/14/2023 Target Resolution Date: 03/08/2023 Goal Status: Active Interventions: Encourage patient to take pain medications as prescribed Provide education on pain management Treatment Activities: Administer pain control measures as ordered : 02/14/2023 Notes: Electronic Signature(s) Signed: 02/14/2023 6:11:41 PM By: Shawn Stall RN, BSN Entered By: Shawn Stall on 02/14/2023 13:16:53 -------------------------------------------------------------------------------- Pain Assessment Details Patient Name: Date of Service: NATHAN, PINSON 02/14/2023 3:15 PM Medical Record Number: [id number] Patient Account Number: [id number] Date of Birth/Sex: Treating RN: 1950/05/23 (72 y.o. M) Primary Care Giorgia Wahler: Everrett Coombe Other Clinician: Referring Wanisha Shiroma: Treating Jhordan Mckibben/Extender: Shawna Clamp in Treatment: 19 Active Problems Location of Pain Severity and Description of Pain Patient Has Paino No Site Locations [place], [place] ([id number]) [id number] Page 6 of 9 Pain Management and Medication Current Pain Management: Electronic Signature(s) Signed: 02/27/2023 4:41:33 PM By: Thayer Dallas Entered By: Thayer Dallas on 02/14/2023 13:02:56 -------------------------------------------------------------------------------- Patient/Caregiver Education Details Patient Name: Date of Service: MARDARIUS, DELBUONO 10/17/2024andnbsp3:15 PM Medical Record Number: [id number] Patient Account Number: [id number] Date of Birth/Gender: Treating RN: [birth year] (72 y.o. Tammy Sours Primary Care Physician: Everrett Coombe Other Clinician: Referring Physician: Treating Physician/Extender: Shawna Clamp in Treatment: 19 Education  Assessment Education Provided To: Patient Education Topics Provided Wound/Skin Impairment: Handouts: Caring for Your Ulcer Methods: Explain/Verbal Responses: Reinforcements needed Electronic Signature(s) Signed: 02/14/2023 6:11:41 PM By: Shawn Stall RN, BSN Entered By: Shawn Stall on 02/14/2023 13:17:14 -------------------------------------------------------------------------------- Wound Assessment Details Patient Name: Date of Service: ANDRIE, KRYSINSKI 02/14/2023 3:15 PM Medical Record Number: [id number] Patient Account Number: [id number] Date of Birth/Sex: Treating RN: 16-Jul-1950 (72 y.o. Tammy Sours Primary Care Jshawn Hurta: Everrett Coombe Other Clinician: [name] ([id number]) [id number] Page 7 of 9 Referring Letoya Stallone: Treating Bernese Doffing/Extender: Shawna Clamp in Treatment: 19 Wound Status Wound Number: 10R Primary Lymphedema Etiology: Wound Location: Left, Anterior Lower Leg Wound Healed - Epithelialized Wounding Event: Blister Status: Date Acquired: 12/10/2022 Comorbid Anemia, Lymphedema, Congestive Heart Failure, Deep Vein Weeks Of Treatment: 9 History: Thrombosis, Type II Diabetes, Rheumatoid Arthritis Clustered Wound: No Photos Wound Measurements Length: (cm) Width: (cm) Depth: (cm) Area: (cm) Volume: (cm) 0 % Reduction in Area: 100% 0 % Reduction in Volume: 100% 0 Epithelialization: Large (67-100%) 0 Tunneling: No 0 Undermining: No Wound Description Classification: Full Thickness Without Exposed Suppor Wound Margin: Distinct, outline attached Exudate Amount: None Present t Structures Foul Odor After Cleansing: No Slough/Fibrino No Wound Bed Granulation Amount: None Present (0%) Exposed Structure Necrotic Amount: None Present (0%) Fascia Exposed: No Fat Layer (Subcutaneous Tissue) Exposed: Yes Tendon Exposed: No Muscle Exposed: No Joint Exposed: No Bone Exposed: No Periwound Skin  Texture Texture Color No Abnormalities Noted: No No Abnormalities Noted: No Callus: No Atrophie Blanche: No Crepitus: No Cyanosis: No Excoriation: No Ecchymosis: No Induration: No Erythema: No Rash: No Hemosiderin Staining: No Scarring: No Mottled: No Pallor: No Moisture Rubor: No No Abnormalities Noted: No Dry / Scaly: No Maceration: No Treatment Notes Wound #10R (Lower Leg) Wound Laterality: Left, Anterior Cleanser Peri-Wound Care Topical Primary Dressing Secondary Dressing Secured With Dallaire, Andron (

## 2023-03-01 ENCOUNTER — Encounter (HOSPITAL_BASED_OUTPATIENT_CLINIC_OR_DEPARTMENT_OTHER): Payer: Medicare Other | Attending: General Surgery | Admitting: General Surgery

## 2023-03-01 DIAGNOSIS — N189 Chronic kidney disease, unspecified: Secondary | ICD-10-CM | POA: Diagnosis not present

## 2023-03-01 DIAGNOSIS — E1122 Type 2 diabetes mellitus with diabetic chronic kidney disease: Secondary | ICD-10-CM | POA: Insufficient documentation

## 2023-03-01 DIAGNOSIS — S81801A Unspecified open wound, right lower leg, initial encounter: Secondary | ICD-10-CM | POA: Insufficient documentation

## 2023-03-01 DIAGNOSIS — Z86718 Personal history of other venous thrombosis and embolism: Secondary | ICD-10-CM | POA: Diagnosis not present

## 2023-03-01 DIAGNOSIS — I890 Lymphedema, not elsewhere classified: Secondary | ICD-10-CM | POA: Diagnosis not present

## 2023-03-01 DIAGNOSIS — M069 Rheumatoid arthritis, unspecified: Secondary | ICD-10-CM | POA: Diagnosis not present

## 2023-03-01 DIAGNOSIS — I87311 Chronic venous hypertension (idiopathic) with ulcer of right lower extremity: Secondary | ICD-10-CM | POA: Insufficient documentation

## 2023-03-01 DIAGNOSIS — E1151 Type 2 diabetes mellitus with diabetic peripheral angiopathy without gangrene: Secondary | ICD-10-CM | POA: Diagnosis not present

## 2023-03-01 DIAGNOSIS — I509 Heart failure, unspecified: Secondary | ICD-10-CM | POA: Diagnosis not present

## 2023-03-01 DIAGNOSIS — S81802A Unspecified open wound, left lower leg, initial encounter: Secondary | ICD-10-CM | POA: Insufficient documentation

## 2023-03-01 NOTE — Progress Notes (Signed)
RODD, HEFT ([id number]) [id number] Page 1 of 1 Visit Report for 03/01/2023 SuperBill Details Patient Name: Date of Service: DEONTA, BOMBERGER 03/01/2023 Medical Record Number: [id number] Patient Account Number: [id number] Date of Birth/Sex: Treating RN: 05-29-1950 (72 y.o. 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

## 2023-03-01 NOTE — Progress Notes (Signed)
SHARVIL, HOEY ([id number]) [id number] Page 1 of 4 Visit Report for 03/01/2023 Arrival Information Details Patient Name: Date of Service: CAYSEN, WHANG 03/01/2023 10:45 A M Medical Record Number: [id number] Patient Account Number: [id number] Date of Birth/Sex: Treating RN: 18-Mar-1951 (72 y.o. M) Primary Care Tranika Scholler: Everrett Coombe Other Clinician: Referring Roy Tokarz: Treating Marymargaret Kirker/Extender: Scherrie Merritts in Treatment: 21 Visit Information History Since Last Visit All ordered tests and consults were completed: No Patient Arrived: Dan Humphreys Added or deleted any medications: No Arrival Time: 11:18 Any new allergies or adverse reactions: No Accompanied By: wife Had a fall or experienced change in No Transfer Assistance: None activities of daily living that may affect Patient Identification Verified: Yes risk of falls: Secondary Verification Process Completed: Yes Signs or symptoms of abuse/neglect since last visito No Patient Requires Transmission-Based Precautions: No Hospitalized since last visit: No Patient Has Alerts: Yes Implantable device outside of the clinic excluding No Patient Alerts: ELIQUIS cellular tissue based products placed in the center since last visit: Has Dressing in Place as Prescribed: Yes Has Compression in Place as Prescribed: Yes Pain Present Now: No Electronic Signature(s) Signed: 03/01/2023 11:50:24 AM By: Thayer Dallas Entered By: Thayer Dallas on 03/01/2023 11:48:21 -------------------------------------------------------------------------------- Compression Therapy Details Patient Name: Date of Service: QUANTAY, ZAREMBA 03/01/2023 10:45 A M Medical Record Number: [id number] Patient Account Number: [id number] Date of Birth/Sex: Treating RN: 11/03/1950 (72 y.o. M) Primary Care Icker Swigert: Everrett Coombe Other Clinician: Referring Bryndle Corredor: Treating Akshita Italiano/Extender: Scherrie Merritts  in Treatment: 21 Compression Therapy Performed for Wound Assessment: Wound #13 Right,Anterior Lower Leg Performed By: Clinician Thayer Dallas, Compression Type: Double Layer Electronic Signature(s) Signed: 03/01/2023 11:50:24 AM By: Thayer Dallas Entered By: Thayer Dallas on 03/01/2023 11:48:59 -------------------------------------------------------------------------------- Encounter Discharge Information Details Patient Name: Date of Service: GEFFREY, MICHAELSEN 03/01/2023 10:45 A Claudean Kinds, [name] ([id number]) [id number] Page 2 of 4 Medical Record Number: [id number] Patient Account Number: [id number] Date of Birth/Sex: Treating RN: [birth year] (72 y.o. M) Primary Care Dominika Losey: Everrett Coombe Other Clinician: Thayer Dallas Referring Mahina Salatino: Treating Ruhani Umland/Extender: Scherrie Merritts in Treatment: 21 Encounter Discharge Information Items Discharge Condition: Stable Ambulatory Status: Walker Discharge Destination: Home Transportation: Private Auto Accompanied By: wife Schedule Follow-up Appointment: Yes Clinical Summary of Care: Electronic Signature(s) Signed: 03/01/2023 11:50:24 AM By: Thayer Dallas Entered By: Thayer Dallas on 03/01/2023 11:49:36 -------------------------------------------------------------------------------- Patient/Caregiver Education Details Patient Name: Date of Service: [name] 11/1/2024andnbsp10:45 A M Medical Record Number: [id number] Patient Account Number: [id number] Date of Birth/Gender: Treating RN: 11-20-1950 (72 y.o. M) Primary Care Physician: Everrett Coombe Other Clinician: Thayer Dallas Referring Physician: Treating Physician/Extender: Scherrie Merritts in Treatment: 21 Education Assessment Education Provided To: Patient Education Topics Provided Electronic Signature(s) Signed: 03/01/2023 11:50:24 AM By: Thayer Dallas Entered By: Thayer Dallas on 03/01/2023  11:49:19 -------------------------------------------------------------------------------- Wound Assessment Details Patient Name: Date of Service: HAKEEM, FRAZZINI 03/01/2023 10:45 A M Medical Record Number: [id number] Patient Account Number: [id number] Date of Birth/Sex: Treating RN: [birth year] (72 y.o. M) Primary Care Winefred Hillesheim: Everrett Coombe Other Clinician: Referring Karianne Nogueira: Treating Jinelle Butchko/Extender: Scherrie Merritts in Treatment: 21 Wound Status Wound Number: 13 Primary Etiology: Lymphedema Wound Location: Right, Anterior Lower Leg Wound Status: Open Wounding Event: Blister Date Acquired: 02/07/2023 Weeks Of Treatment: 2 Clustered Wound: No Escoe, Paschal ([id number]) [id number] Page 3 of 4 Wound Measurements Length: (cm) 2.7 Width: (cm) 2.6 Depth: (cm) 0.1 Area: (cm) 5.513 Volume: (cm) 0.551 % Reduction in Area: 15.6% % Reduction in Volume: 15.6% Wound  Description Classification: Full Thickness Without Exposed Suppor Exudate Amount: Medium Exudate Type: Serosanguineous Exudate Color: red, brown t Structures Periwound Skin Texture Texture Color No Abnormalities Noted: No No Abnormalities Noted: No Moisture No Abnormalities Noted: No Treatment Notes Wound #13 (Lower Leg) Wound Laterality: Right, Anterior Cleanser Soap and Water Discharge Instruction: May shower and wash wound with dial antibacterial soap and water prior to dressing change. Vashe 5.8 (oz) Discharge Instruction: Cleanse the wound with Vashe prior to applying a clean dressing using gauze sponges, not tissue or cotton balls. Peri-Wound Care Sween Lotion (Moisturizing lotion) Discharge Instruction: Apply moisturizing lotion as directed Topical Primary Dressing Hydrofera Blue Ready Transfer Foam, 2.5x2.5 (in/in) Discharge Instruction: Apply directly to wound bed as directed Secondary Dressing ABD Pad, 8x10 Discharge Instruction: Apply over primary  dressing as directed. Secured With Compression Wrap Urgo K2, (equivalent to a 4 layer) two layer compression system, regular Discharge Instruction: Apply Urgo K2 as directed (alternative to 4 layer compression). Compression Stockings Add-Ons Electronic Signature(s) Signed: 03/01/2023 11:50:24 AM By: Thayer Dallas Entered By: Thayer Dallas on 03/01/2023 11:48:41 -------------------------------------------------------------------------------- Vitals Details Patient Name: Date of Service: KEELIN, SHERIDAN 03/01/2023 10:45 A M Medical Record Number: [id number] Patient Account Number: [id number] Date of Birth/Sex: Treating RN: 27-Feb-1951 (72 y.o. M) Primary Care Kruze Atchley: Everrett Coombe Other Clinician: Referring Yazleen Molock: Treating Pavlos Yon/Extender: Scherrie Merritts in Treatment: [address], [place] ([id number]) [id number] Page 4 of 4 Vital Signs Time Taken: 11:48 Reference Range: 80 - 120 mg / dl Height (in): 68 Weight (lbs): 220 Body Mass Index (BMI): 33.4 Electronic Signature(s) Signed: 03/01/2023 11:50:24 AM By: Thayer Dallas Entered By: Thayer Dallas on 03/01/2023 11:48:28

## 2023-03-05 ENCOUNTER — Encounter (HOSPITAL_BASED_OUTPATIENT_CLINIC_OR_DEPARTMENT_OTHER): Payer: Medicare Other | Admitting: Internal Medicine

## 2023-03-05 DIAGNOSIS — I890 Lymphedema, not elsewhere classified: Secondary | ICD-10-CM | POA: Diagnosis not present

## 2023-03-05 DIAGNOSIS — E1151 Type 2 diabetes mellitus with diabetic peripheral angiopathy without gangrene: Secondary | ICD-10-CM | POA: Diagnosis not present

## 2023-03-05 DIAGNOSIS — S81801A Unspecified open wound, right lower leg, initial encounter: Secondary | ICD-10-CM | POA: Diagnosis not present

## 2023-03-05 DIAGNOSIS — Z86718 Personal history of other venous thrombosis and embolism: Secondary | ICD-10-CM | POA: Diagnosis not present

## 2023-03-05 DIAGNOSIS — S80821A Blister (nonthermal), right lower leg, initial encounter: Secondary | ICD-10-CM | POA: Diagnosis not present

## 2023-03-05 DIAGNOSIS — I87311 Chronic venous hypertension (idiopathic) with ulcer of right lower extremity: Secondary | ICD-10-CM | POA: Diagnosis not present

## 2023-03-05 DIAGNOSIS — S81802A Unspecified open wound, left lower leg, initial encounter: Secondary | ICD-10-CM | POA: Diagnosis not present

## 2023-03-06 NOTE — Progress Notes (Addendum)
MATHEAU, ORONA ([id number]) [id number] Page 1 of 7 Visit Report for 03/05/2023 HPI Details Patient Name: Date of Service: [name], [name] 03/05/2023 3:00 PM Medical Record Number: [id number] Patient Account Number: [id number] Date of Birth/Sex: Treating RN: 1950/05/06 (72 y.o. M) Primary Care Provider: Everrett Coombe Other Clinician: Referring Provider: Treating Provider/Extender: Shawna Clamp in Treatment: 21 History of Present Illness HPI Description: ADMISSION 10/04/2022 This is a 72 year old man who is quite disabled because of some form of myopathy. He follows for this with neurology. He has had biopsies that showed necrosis without inflammation. 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 In any case he is reasonably immobile at home. He has this apparatus to help him transfer. Because of difficulties getting him upstairs he has been sleeping in a recliner on the main floor. He also has a history of lymphedema and I see he was followed by PT for a period of time. He has a history of combined congestive heart failure but his last echo showed an EF of 60 to 65%. He has developed a wound on the right lateral leg over the last month or so. He also has a smaller wound on the right anterior lower leg. He is having problems with his left second toe and he has bilateral stage II ulcers over his ischial tuberosity. The patient is complaining of oral pain under his tongue. He has bilateral heel pain and increasing edema with weight gain over the past week or 2 Past medical history includes myopathy/myositis question inclusion body myositis, rheumatoid arthritis, coronary artery disease, massive pulmonary embolism, type 2 diabetes, lymphedema, combined congestive heart failure, bilateral foot drop PTSD ABIs in our clinic were 1.22 on the right 1.27 on the  left 6/17; patient presents for follow-up. He has been using silver alginate to the bilateral buttocks wounds. These have healed. We have also been using silver alginate to the right lower extremity under compression therapy. He is tolerated this well. The wounds are smaller. 6/25; patient presents for follow-up. We have been using silver alginate to the right lower extremity under compression therapy. The anterior leg wound has healed. The lateral right leg wound is smaller. 7/8; patient presents for follow-up. We have been using silver alginate under compression therapy to the right lower extremity wound and this is healed. Unfortunately he has developed a blister to the left lower extremity. 7/15; patient presents for follow-up. We have been using silver alginate under 4-layer compression to the left lower extremity. His wound has healed here. Unfortunately he has developed blistering to the right lower extremity and has open wounds despite using compression therapy daily here. 7/22; patient presents for follow-up. We have been using silver alginate under 4-layer compression to the right lower extremity. He has been using his zipper compression wrap to the left lower extremity daily. 2 wounds have healed on the right lower extremity. He has 1 remaining wound to the anterior aspect. He has no issues or complaints today. 8/2; patient presents for follow-up. We have been using silver alginate under Urgo K2 to the right lower extremity. His wounds have healed. He has zipper compression stockings to use. We have ordered him thigh-high Velcro compression wraps that are arriving in 2 weeks. 8/12; patient presents for follow-up. Unfortunately he has reopened wounds on both lower extremities. He had to stop his diuretic due to low blood pressure And now his lower  extremities are more edematous.. He has received his thigh-high Velcro compression wraps and lymphedema pumps. 8/22; patient presents for  follow-up. We have been using silver alginate under 4-layer compression to the lower extremities bilaterally. His wounds are smaller. He has no issues or complaints today. 8/27; patient presents for follow-up. We have been using silver alginate with antibiotic ointment under 4-layer equivalent compression wraps to the lower extremities bilaterally. The right lower extremity wound has healed. He still has a small open wound to the left lower extremity. He has his juxta fits with him today. This will be applied to the right leg. 9/5; patient presents for follow-up. He has been wearing juxta fits to the right lower extremity. Unfortunately he developed a blister with a new wound to the this leg. We have been using silver alginate with antibiotic ointment under 4-layer compression to the left lower extremity. The left lower extremity wound has healed. 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 He is on Eliquis and reports taking this daily. 10/8; the patient has no open wound on the right leg. He developed a new blister on the left leg which morphed into a new wound sometime in August although he was seen here on 9/5. He has juxta fit stockings and compression pumps but they tell me somebody told them not to use the compression pumps over stockings. I have never really heard this. In any case he is now here with a unroofed blister type wound on the left anterior mid tibia. 10/17; the patient was discharged with no open wound on the right leg however he developed a new open blister on the right leg now has a open wound in the mid part of the right leg. The area on the left leg is closed. He has systemic edema the cause of this is not really clear. 10/24; the patient has his 72 remaining wound on the right mid tibia. Nothing open on the left leg. Comes in with what his wife claims that his thigh-high external compression garments. He has a  Tubigrip under this. The compression garment running from his mid tibia to above his knee. She is insistent that we ordered these and put them on the last time. [name], [name] ([id number]) [id number] Page 2 of 7 We have been using Hydrofera Blue according to my notes although it looks as though this patient had adherent silver alginate on the right leg wound. This was under a Urgo K2 wrap that was maintained 11/5; the patient's area on the right mid tibia is closed. He traumatized his left anterior leg while getting out of the shower there is a small open wound here just below where his original wound was on the left leg. He is using 3 layer juxta fit apparatuses right up into his upper thighs he also has compression pumps he saw his cardiologist, his diuretic doses were not altered i.e. torsemide 20 mg. Electronic Signature(s) Signed: 03/05/2023 4:32:14 PM By: Baltazar Najjar MD Entered By: Baltazar Najjar on 03/05/2023 15:58:15 -------------------------------------------------------------------------------- Physical Exam Details Patient Name: Date of Service: [name], [name] 03/05/2023 3:00 PM Medical Record Number: [id number] Patient Account Number: [id number] Date of Birth/Sex: Treating RN: 04-Dec-1950 (72 y.o. M) Primary Care Provider: Everrett Coombe Other Clinician: Referring Provider: Treating Provider/Extender: Shawna Clamp in Treatment: 21 Constitutional Sitting or standing Blood Pressure is within target range for patient.. Pulse regular and within target range for  patient.Marland Kitchen Respirations regular, non-labored and within target range.. Temperature is normal and within the target range for the patient.Marland Kitchen Appears in no distress. Notes Wound exam; left lower extremity anteriorly. Small clean abrasion on the left anterior lower leg at the lower part of his original wound in this area. The right leg is healed. His edema control in both legs is a  lot better. He is complaining about pain in the right thigh there is still some pitting edema posteriorly here but no palpable tenderness no erythema. Electronic Signature(s) Signed: 03/05/2023 4:32:14 PM By: Baltazar Najjar MD Entered By: Baltazar Najjar on 03/05/2023 15:59:24 -------------------------------------------------------------------------------- Physician Orders Details Patient Name: Date of Service: [name], [name] 03/05/2023 3:00 PM Medical Record Number: [id number] Patient Account Number: [id number] Date of Birth/Sex: Treating RN: [birth year] (72 y.o. [name], [name] Primary Care Provider: Everrett Coombe Other Clinician: Referring Provider: Treating Provider/Extender: Shawna Clamp in Treatment: 21 The following information was scribed by: Shawn Stall The information was scribed for: Baltazar Najjar Verbal / Phone Orders: No Diagnosis Coding ICD-10 Coding Code Description I87.312 Chronic venous hypertension (idiopathic) with ulcer of left lower extremity I87.311 Chronic venous hypertension (idiopathic) with ulcer of right lower extremity S81.802A Unspecified open wound, left lower leg, initial encounter S81.801A Unspecified open wound, right lower leg, initial encounter I89.0 Lymphedema, not elsewhere classified 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, not elsewhere classified Follow-up Appointments Sebastiani, [name] ([id number]) [id number] Page 3 of 7 ppointment in 1 week. - Dr. Leanord Hawking 03/14/2023 3pm (already scheduled) Return A Other: - wear juxtafit HD to whole both legs. ********tubigrip size E double layer *****applied in clinic****remove once you are home and apply your compression garments.**** Anesthetic (In clinic) Topical Lidocaine 4% applied to wound bed - In clinic Edema Control - Orders / Instructions Elevate legs to the level of the heart or above for 30 minutes daily and/or when sitting for 3-4 times  a day throughout the day. Avoid standing for long periods of time. Patient to wear own compression stockings every day. Compression stocking or Garment 30-40 mm/Hg pressure to: - juxtafit HD both legs whole legs. apply in the morning and remove at night. Lymphedema Treatment Plan - Exercise, Compression and Elevation Exercise daily as tolerated. (Walking, ROM, Calf Pumps and Toe Taps) Compression Garments, Class II. Wear daily when out of bed. May remove at bedtime Elevate legs 30 - 60 minutes at or above heart level at least 3 - 4 times daily as able/tolerated Avoid standing for long periods and elevate leg(s) parallel to the floor when sitting Use Pneumatic Compression Device on leg(s) 2-3 times a day for 45-60 minutes Lymphedema Assessment / Effectiveness of Treatment(s) Patient continues compression, exercise, and elevation without significant improvement of lymphedema symptoms. Continue lymphedema treatment plan Patient has been compliant with compression, exercise and elevation with significant improvement, pneumatic compression is no longer medically necessary at this time Patient has completed a 28-35 day trial and despite compliance with compression exercise and elevation, treatments have been ineffective in controlling edema and significant symptoms remain. Pneumatic compression is medically necessary for long-term management of lymphedema symptoms Wound Treatment Wound #14 - Lower Leg Wound Laterality: Left, Anterior Cleanser: Wound Cleanser 2 x Per Week/30 Days Discharge Instructions: Cleanse the wound with wound cleanser prior to applying a clean dressing using gauze sponges, not tissue or cotton balls. Prim Dressing: Promogran Prisma Matrix, 4.34 (sq in) (silver collagen) 2 x Per Week/30 Days ary Discharge Instructions: Moisten collagen with saline or hydrogel Secondary Dressing: Zetuvit  Plus Silicone Border Dressing 3x3 (in/in) 2 x Per Week/30 Days Discharge Instructions:  Apply silicone border over primary dressing as directed. Compression Wrap: tubigrip size E double layer 2 x Per Week/30 Days Discharge Instructions: ****apply in clinic****remove once you are home and apply your compression garments. Compression Wrap: juxtafit HD whole legs to both legs. 2 x Per Week/30 Days Electronic Signature(s) Signed: 03/05/2023 4:32:14 PM By: Baltazar Najjar MD Signed: 03/05/2023 4:47:30 PM By: Shawn Stall RN, BSN Entered By: Shawn Stall on 03/05/2023 15:48:50 -------------------------------------------------------------------------------- Problem List Details Patient Name: Date of Service: [name], [name] 03/05/2023 3:00 PM Medical Record Number: [id number] Patient Account Number: [id number] Date of Birth/Sex: Treating RN: [birth year] (72 y.o. Tammy Sours Primary Care Provider: Everrett Coombe Other Clinician: Referring Provider: Treating Provider/Extender: Shawna Clamp in Treatment: 21 Active Problems ICD-10 Encounter Code Description Active Date MDM Diagnosis I87.312 Chronic venous hypertension (idiopathic) with ulcer of left lower extremity 11/05/2022 No Yes Mccauley, Spiros ([id number]) [phone].pdf Page 4 of 7 I87.311 Chronic venous hypertension (idiopathic) with ulcer of right lower extremity 11/19/2022 No Yes S81.802A Unspecified open wound, left lower leg, initial encounter 11/05/2022 No Yes S81.801A Unspecified open wound, right lower leg, initial encounter 12/10/2022 No Yes I89.0 Lymphedema, not elsewhere classified 10/04/2022 No Yes 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, not elsewhere classified 10/04/2022 No Yes Inactive Problems Resolved Problems ICD-10 Code Description Active Date Resolved Date L89.312 Pressure ulcer of right buttock, stage 2 10/04/2022 10/04/2022 L89.322 Pressure ulcer of left buttock, stage 2 10/04/2022 10/04/2022 L97.818 Non-pressure chronic ulcer of other part of right lower leg with  other specified severity 10/04/2022 10/04/2022 B37.0 Candidal stomatitis 10/04/2022 10/04/2022 Electronic Signature(s) Signed: 03/05/2023 4:32:14 PM By: Baltazar Najjar MD Entered By: Baltazar Najjar on 03/05/2023 15:56:36 -------------------------------------------------------------------------------- Progress Note Details Patient Name: Date of Service: [name] 03/05/2023 3:00 PM Medical Record Number: [id number] Patient Account Number: [id number] Date of Birth/Sex: Treating RN: 1950-09-09 (72 y.o. M) Primary Care Provider: Everrett Coombe Other Clinician: Referring Provider: Treating Provider/Extender: Shawna Clamp in Treatment: 21 Subjective History of Present Illness (HPI) ADMISSION 10/04/2022 This is a 72 year old man who is quite disabled because of some form of myopathy. He follows for this with neurology. He has had biopsies that showed necrosis without inflammation. 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 In any case he is reasonably immobile at home. He has this apparatus to help him transfer. Because of difficulties getting him upstairs he has been sleeping in a recliner on the main floor. He also has a history of lymphedema and I see he was followed by PT for a period of time. He has a history of combined congestive heart failure but his last echo showed an EF of 60 to 65%. He has developed a wound on the right lateral leg over the last month or so. He also has a smaller wound on the right anterior lower leg. He is having problems with his left second toe and he has bilateral stage II ulcers over his ischial tuberosity. CORDAI, RODRIGUE ([id number]) [id number] Page 5 of 7 The patient is complaining of oral pain under his tongue. He has bilateral heel pain and increasing edema with weight gain over the past week or 2 Past medical  history includes myopathy/myositis question inclusion body myositis, rheumatoid arthritis, coronary artery disease, massive pulmonary embolism, type 2 diabetes, lymphedema, combined congestive heart failure, bilateral foot  drop PTSD ABIs in our clinic were 1.22 on the right 1.27 on the left 6/17; patient presents for follow-up. He has been using silver alginate to the bilateral buttocks wounds. These have healed. We have also been using silver alginate to the right lower extremity under compression therapy. He is tolerated this well. The wounds are smaller. 6/25; patient presents for follow-up. We have been using silver alginate to the right lower extremity under compression therapy. The anterior leg wound has healed. The lateral right leg wound is smaller. 7/8; patient presents for follow-up. We have been using silver alginate under compression therapy to the right lower extremity wound and this is healed. Unfortunately he has developed a blister to the left lower extremity. 7/15; patient presents for follow-up. We have been using silver alginate under 4-layer compression to the left lower extremity. His wound has healed here. Unfortunately he has developed blistering to the right lower extremity and has open wounds despite using compression therapy daily here. 7/22; patient presents for follow-up. We have been using silver alginate under 4-layer compression to the right lower extremity. He has been using his zipper compression wrap to the left lower extremity daily. 2 wounds have healed on the right lower extremity. He has 1 remaining wound to the anterior aspect. He has no issues or complaints today. 8/2; patient presents for follow-up. We have been using silver alginate under Urgo K2 to the right lower extremity. His wounds have healed. He has zipper compression stockings to use. We have ordered him thigh-high Velcro compression wraps that are arriving in 2 weeks. 8/12; patient presents for  follow-up. Unfortunately he has reopened wounds on both lower extremities. He had to stop his diuretic due to low blood pressure And now his lower extremities are more edematous.. He has received his thigh-high Velcro compression wraps and lymphedema pumps. 8/22; patient presents for follow-up. We have been using silver alginate under 4-layer compression to the lower extremities bilaterally. His wounds are smaller. He has no issues or complaints today. 8/27; patient presents for follow-up. We have been using silver alginate with antibiotic ointment under 4-layer equivalent compression wraps to the lower extremities bilaterally. The right lower extremity wound has healed. He still has a small open wound to the left lower extremity. He has his juxta fits with him today. This will be applied to the right leg. 9/5; patient presents for follow-up. He has been wearing juxta fits to the right lower extremity. Unfortunately he developed a blister with a new wound to the this leg. We have been using silver alginate with antibiotic ointment under 4-layer compression to the left lower extremity. The left lower extremity wound has healed. 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 He is on Eliquis and reports taking this daily. 10/8; the patient has no open wound on the right leg. He developed a new blister on the left leg which morphed into a new wound sometime in August although he was seen here on 9/5. He has juxta fit stockings and compression pumps but they tell me somebody told them not to use the compression pumps over stockings. I have never really heard this. In any case he is now here with a unroofed blister type wound on the left anterior mid tibia. 10/17; the patient was discharged with no open wound on the right leg however he developed a new open blister on the right leg now has a  open wound in the mid part of the right leg. The  area on the left leg is closed. He has systemic edema the cause of this is not really clear. 10/24; the patient has his 72 remaining wound on the right mid tibia. Nothing open on the left leg. Comes in with what his wife claims that his thigh-high external compression garments. He has a Tubigrip under this. The compression garment running from his mid tibia to above his knee. She is insistent that we ordered these and put them on the last time. We have been using Hydrofera Blue according to my notes although it looks as though this patient had adherent silver alginate on the right leg wound. This was under a Urgo K2 wrap that was maintained 11/5; the patient's area on the right mid tibia is closed. He traumatized his left anterior leg while getting out of the shower there is a small open wound here just below where his original wound was on the left leg. He is using 3 layer juxta fit apparatuses right up into his upper thighs he also has compression pumps he saw his cardiologist, his diuretic doses were not altered i.e. torsemide 20 mg. Objective Constitutional Sitting or standing Blood Pressure is within target range for patient.. Pulse regular and within target range for patient.Marland Kitchen Respirations regular, non-labored and within target range.. Temperature is normal and within the target range for the patient.Marland Kitchen Appears in no distress. Vitals Time Taken: 3:30 PM, Height: 68 in, Weight: 220 lbs, BMI: 33.4, Temperature: 98.5 F, Pulse: 102 bpm, Respiratory Rate: 18 breaths/min, Blood Pressure: 124/73 mmHg. General Notes: Wound exam; left lower extremity anteriorly. Small clean abrasion on the left anterior lower leg at the lower part of his original wound in this area. The right leg is healed. His edema control in both legs is a lot better. He is complaining about pain in the right thigh there is still some pitting edema posteriorly here but no palpable tenderness no erythema. Integumentary (Hair,  Skin) Wound #13 status is Healed - Epithelialized. Original cause of wound was Blister. The date acquired was: 02/07/2023. The wound has been in treatment 2 weeks. The wound is located on the Right,Anterior Lower Leg. The wound measures 0cm length x 0cm width x 0cm depth; 0cm^2 area and 0cm^3 volume. There is no tunneling or undermining noted. There is a medium amount of serosanguineous drainage noted. There is no granulation within the wound bed. There is no necrotic tissue within the wound bed. The periwound skin appearance did not exhibit: Callus, Crepitus, Excoriation, Induration, Rash, Scarring, Dry/Scaly, Maceration, Atrophie Blanche, Cyanosis, Ecchymosis, Hemosiderin Staining, Mottled, Pallor, Rubor, Erythema. Periwound temperature was noted as No Abnormality. [name], [name] ([id number]) [id number] Page 6 of 7 Wound #14 status is Open. Original cause of wound was Trauma. The date acquired was: 03/05/2023. The wound is located on the Left,Anterior Lower Leg. The wound measures 0.6cm length x 0.6cm width x 0.1cm depth; 0.283cm^2 area and 0.028cm^3 volume. There is no tunneling or undermining noted. There is a medium amount of serosanguineous drainage noted. The wound margin is distinct with the outline attached to the wound base. There is large (67-100%) red granulation within the wound bed. There is no necrotic tissue within the wound bed. The periwound skin appearance did not exhibit: Callus, Crepitus, Excoriation, Induration, Rash, Scarring, Dry/Scaly, Maceration, Atrophie Blanche, Cyanosis, Ecchymosis, Hemosiderin Staining, Mottled, Pallor, Rubor, Erythema. Assessment Active Problems ICD-10 Chronic venous hypertension (idiopathic) with ulcer of left lower extremity Chronic venous hypertension (  idiopathic) with ulcer of right lower extremity Unspecified open wound, left lower leg, initial encounter Unspecified open wound, right lower leg, initial  encounter Lymphedema, not elsewhere classified Other inflammatory and immune myopathies, not elsewhere classified Plan Follow-up Appointments: Return Appointment in 1 week. - Dr. Leanord Hawking 03/14/2023 3pm (already scheduled) Other: - wear juxtafit HD to whole both legs. ********tubigrip size E double layer *****applied in clinic****remove once you are home and apply your compression garments.**** Anesthetic: (In clinic) Topical Lidocaine 4% applied to wound bed - In clinic Edema Control - Orders / Instructions: Elevate legs to the level of the heart or above for 30 minutes daily and/or when sitting for 3-4 times a day throughout the day. Avoid standing for long periods of time. Patient to wear own compression stockings every day. Compression stocking or Garment 30-40 mm/Hg pressure to: - juxtafit HD both legs whole legs. apply in the morning and remove at night. Lymphedema Treatment Plan - Exercise, Compression and Elevation: Exercise daily as tolerated. (Walking, ROM, Calf Pumps and T T oe aps) Compression Garments, Class II. Wear daily when out of bed. May remove at bedtime Elevate legs 30 - 60 minutes at or above heart level at least 3 - 4 times daily as able/tolerated Avoid standing for long periods and elevate leg(s) parallel to the floor when sitting Use Pneumatic Compression Device on leg(s) 2-3 times a day for 45-60 minutes Lymphedema Assessment / Effectiveness of Treatment(s): Patient continues compression, exercise, and elevation without significant improvement of lymphedema symptoms. Continue lymphedema treatment plan Patient has been compliant with compression, exercise and elevation with significant improvement, pneumatic compression is no longer medically necessary at this time Patient has completed a 28-35 day trial and despite compliance with compression exercise and elevation, treatments have been ineffective in controlling edema and significant symptoms remain. Pneumatic  compression is medically necessary for long-term management of lymphedema symptoms WOUND #14: - Lower Leg Wound Laterality: Left, Anterior Cleanser: Wound Cleanser 2 x Per Week/30 Days Discharge Instructions: Cleanse the wound with wound cleanser prior to applying a clean dressing using gauze sponges, not tissue or cotton balls. Prim Dressing: Promogran Prisma Matrix, 4.34 (sq in) (silver collagen) 2 x Per Week/30 Days ary Discharge Instructions: Moisten collagen with saline or hydrogel Secondary Dressing: Zetuvit Plus Silicone Border Dressing 3x3 (in/in) 2 x Per Week/30 Days Discharge Instructions: Apply silicone border over primary dressing as directed. Com pression Wrap: tubigrip size E double layer 2 x Per Week/30 Days Discharge Instructions: ****apply in clinic****remove once you are home and apply your compression garments. Com pression Wrap: juxtafit HD whole legs to both legs. 2 x Per Week/30 Days 1. Will use collagen on the remaining wound on the left anterior lower leg I did not wrap him. He will use his juxta fit apparatus and the external compression pumps. 2. The cause of his rather extensive pitting edema has never really been clear to me he is still on torsemide 20 mg recently saw his cardiologist and this was not adjusted 3. They did not bring the juxta fit in the clinic we will discharge him with double layer Tubigrip and they will put on the juxta fit as when he gets home Electronic Signature(s) Signed: 03/05/2023 4:32:14 PM By: Baltazar Najjar MD Entered By: Baltazar Najjar on 03/05/2023 16:00:24 SuperBill Details -------------------------------------------------------------------------------- [name] ([id number]) [id number] Page 7 of 7 Patient Name: Date of Service: [name], [name] 03/05/2023 Medical Record Number: [id number] Patient Account Number: [id number] Date of Birth/Sex: Treating RN: 06-04-1950 (72 y.o.  M) Primary Care Provider:  Everrett Coombe Other Clinician: Referring Provider: Treating Provider/Extender: Shawna Clamp in Treatment: 21 Diagnosis Coding ICD-10 Codes Code Description [phone] Chronic venous hypertension (idiopathic) with ulcer of left lower extremity I87.311 Chronic venous hypertension (idiopathic) with ulcer of right lower extremity S81.802A Unspecified open wound, left lower leg, initial encounter S81.801A Unspecified open wound, right lower leg, initial encounter I89.0 Lymphedema, not elsewhere classified 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, not elsewhere classified Facility Procedures CPT4 Code Description Modifier Quantity [id number] - WOUND CARE VISIT-LEV 3 EST PT 1 Physician Procedures Quantity CPT4 Code Description Modifier [id number] - WC PHYS LEVEL 3 - EST PT 1 ICD-10 Diagnosis Description I87.312 Chronic venous hypertension (idiopathic) with ulcer of left lower extremity I87.311 Chronic venous hypertension (idiopathic) with ulcer of right lower extremity S81.802A Unspecified open wound, left lower leg, initial encounter S81.801A Unspecified open wound, right lower leg, initial encounter Electronic Signature(s) Signed: 03/06/2023 10:05:42 AM By: Pearletha Alfred Signed: 03/06/2023 4:54:50 PM By: Baltazar Najjar MD Previous Signature: 03/05/2023 4:32:14 PM Version By: Baltazar Najjar MD Entered By: Pearletha Alfred on 03/06/2023 10:05:41

## 2023-03-07 ENCOUNTER — Ambulatory Visit: Payer: Medicare Other | Admitting: Family Medicine

## 2023-03-07 DIAGNOSIS — E119 Type 2 diabetes mellitus without complications: Secondary | ICD-10-CM | POA: Diagnosis not present

## 2023-03-07 DIAGNOSIS — H2513 Age-related nuclear cataract, bilateral: Secondary | ICD-10-CM | POA: Diagnosis not present

## 2023-03-07 DIAGNOSIS — H35372 Puckering of macula, left eye: Secondary | ICD-10-CM | POA: Diagnosis not present

## 2023-03-07 DIAGNOSIS — H34832 Tributary (branch) retinal vein occlusion, left eye, with macular edema: Secondary | ICD-10-CM | POA: Diagnosis not present

## 2023-03-13 ENCOUNTER — Ambulatory Visit (INDEPENDENT_AMBULATORY_CARE_PROVIDER_SITE_OTHER): Payer: Medicare Other | Admitting: Family Medicine

## 2023-03-13 ENCOUNTER — Ambulatory Visit: Payer: Medicare Other

## 2023-03-13 ENCOUNTER — Encounter: Payer: Self-pay | Admitting: Family Medicine

## 2023-03-13 VITALS — BP 133/77 | HR 99 | Ht 68.0 in | Wt 233.0 lb

## 2023-03-13 DIAGNOSIS — E039 Hypothyroidism, unspecified: Secondary | ICD-10-CM

## 2023-03-13 DIAGNOSIS — E1165 Type 2 diabetes mellitus with hyperglycemia: Secondary | ICD-10-CM | POA: Diagnosis not present

## 2023-03-13 DIAGNOSIS — I771 Stricture of artery: Secondary | ICD-10-CM | POA: Diagnosis not present

## 2023-03-13 DIAGNOSIS — R0602 Shortness of breath: Secondary | ICD-10-CM | POA: Diagnosis not present

## 2023-03-13 DIAGNOSIS — M609 Myositis, unspecified: Secondary | ICD-10-CM | POA: Diagnosis not present

## 2023-03-13 DIAGNOSIS — I10 Essential (primary) hypertension: Secondary | ICD-10-CM | POA: Diagnosis not present

## 2023-03-13 DIAGNOSIS — R0600 Dyspnea, unspecified: Secondary | ICD-10-CM

## 2023-03-13 DIAGNOSIS — R059 Cough, unspecified: Secondary | ICD-10-CM | POA: Diagnosis not present

## 2023-03-13 DIAGNOSIS — J40 Bronchitis, not specified as acute or chronic: Secondary | ICD-10-CM | POA: Diagnosis not present

## 2023-03-13 DIAGNOSIS — N400 Enlarged prostate without lower urinary tract symptoms: Secondary | ICD-10-CM

## 2023-03-13 DIAGNOSIS — I5042 Chronic combined systolic (congestive) and diastolic (congestive) heart failure: Secondary | ICD-10-CM

## 2023-03-13 DIAGNOSIS — Z794 Long term (current) use of insulin: Secondary | ICD-10-CM

## 2023-03-13 MED ORDER — MIRABEGRON ER 50 MG PO TB24: 50.0000 mg | ORAL_TABLET | Freq: Every day | ORAL | 1 refills | Status: DC

## 2023-03-13 MED ORDER — AMITRIPTYLINE HCL 25 MG PO TABS: ORAL_TABLET | ORAL | 1 refills | Status: DC

## 2023-03-13 NOTE — Patient Instructions (Addendum)
Try amitriptyline for nerve pain.    Have xrays completed.   Try astepro-this is available over the counter.

## 2023-03-13 NOTE — Progress Notes (Signed)
[name] - 72 y.o. male MRN [id number]  Date of birth: 06/08/1950  Subjective Chief Complaint  Patient presents with   Headache   Cough   Leg Pain   Shortness of Breath    HPI [name] is a 72 y.o. male here today for follow up visit.  He reports he is doing okay.  He has had a little bit of congestion and cough with mild dyspnea.  Cough is nonproductive.  He has not noticed wheezing and denies fever or chills.  He has not had increased edema.  He continues to see endocrinology for management of diabetes.  Last A1c was 9.9%.  He is using a freestyle libre to monitor his glucose.  Reports that readings have been better at home.  They are also monitoring his thyroid.  This has remained stable with current strength of levothyroxine.  He is having some increased thigh pain.  Has history of autoimmune myopathy and RA.  Medications to be managed by rheumatology.  He is taking these as directed.   He feels that his Myrbetriq needs to be increased.  Still having some symptoms of overactive bladder.  He does remain on Flomax as well.  ROS:  A comprehensive ROS was completed and negative except as noted per HPI      Allergies  Allergen Reactions   Benazepril Hives and Cough   Gabapentin Swelling   Hydrocodone Hives and Itching   Shellfish Allergy Diarrhea and Nausea And Vomiting    Past Medical History:  Diagnosis Date   Atopic dermatitis 12/09/2022   Autoimmune disease (HCC)    Bronchitis    Chronic HFrEF (heart failure with reduced ejection fraction) (HCC)    normalized EF   Chronic hiccups    Chronic kidney disease, stage 3a (HCC)    DM2 (diabetes mellitus, type 2) (HCC) 09/28/2014   DVT (deep venous thrombosis) (HCC)    GERD (gastroesophageal reflux disease)    Hypogonadism in male 04/01/2018   Hypothyroidism    Joint pain    Necrotizing myopathy    Nonobstructive atherosclerosis of coronary artery    Posttraumatic stress disorder 01/30/2022   PTSD (post-traumatic  stress disorder)    Pulmonary embolism (HCC)    Rheumatoid arthritis (HCC) 08/09/2021   RSV (respiratory syncytial virus pneumonia) 03/11/2022    Past Surgical History:  Procedure Laterality Date   BIOPSY SHOULDER     Left   IR ANGIOGRAM PULMONARY BILATERAL SELECTIVE  07/22/2021   IR THROMBECT VENO MECH MOD SED  07/22/2021   IR US GUIDE VASC ACCESS LEFT  07/22/2021   RADIOLOGY WITH ANESTHESIA Right 07/22/2021   Procedure: IR WITH ANESTHESIA;  Surgeon: Radiologist, Medication, MD;  Location: [facility] OR;  Service: Radiology;  Laterality: Right;   UPPER LEG SOFT TISSUE BIOPSY Right     Social History   Socioeconomic History   Marital status: Married    Spouse name: Kim   Number of children: 4   Years of education: 16   Highest education level: Master's degree (e.g., MA, MS, MEng, MEd, MSW, MBA)  Occupational History   Occupation: Retired  Tobacco Use   Smoking status: Never   Smokeless tobacco: Never  Vaping Use   Vaping status: Never Used  Substance and Sexual Activity   Alcohol use: No   Drug use: No   Sexual activity: Not on file  Other Topics Concern   Not on file  Social History Narrative   Lives at home with his wife. He enjoys playing  sodoku, playing cards and reading.   Social Determinants of Health   Financial Resource Strain: Low Risk  (08/14/2022)   Overall Financial Resource Strain (CARDIA)    Difficulty of Paying Living Expenses: Not hard at all  Food Insecurity: No Food Insecurity (09/13/2022)   Hunger Vital Sign    Worried About Running Out of Food in the Last Year: Never true    Ran Out of Food in the Last Year: Never true  Transportation Needs: No Transportation Needs (09/13/2022)   PRAPARE - Administrator, Civil Service (Medical): No    Lack of Transportation (Non-Medical): No  Physical Activity: Insufficiently Active (08/14/2022)   Exercise Vital Sign    Days of Exercise per Week: 3 days    Minutes of Exercise per Session: 20 min  Stress: No  Stress Concern Present (08/14/2022)   [employer] of Occupational Health - Occupational Stress Questionnaire    Feeling of Stress : Not at all  Social Connections: Socially Isolated (08/14/2022)   Social Connection and Isolation Panel [NHANES]    Frequency of Communication with Friends and Family: Once a week    Frequency of Social Gatherings with Friends and Family: Never    Attends Religious Services: Never    Database administrator or Organizations: No    Attends Engineer, structural: Never    Marital Status: Married    Family History  Problem Relation Age of Onset   Hypertension Mother    Diabetes Mother    Arthritis Mother    Depression Mother    Heart attack Father    Hypertension Father     Health Maintenance  Topic Date Due   Diabetic kidney evaluation - Urine ACR  Never done   Zoster Vaccines- Shingrix (1 of 2) Never done   DTaP/Tdap/Td (2 - Td or Tdap) 08/19/2021   INFLUENZA VACCINE  11/29/2022   COVID-19 Vaccine (6 - 2023-24 season) 12/30/2022   HEMOGLOBIN A1C  03/16/2023   Colonoscopy  08/14/2023 (Originally 10/28/1993)   OPHTHALMOLOGY EXAM  06/01/2023   Medicare Annual Wellness (AWV)  08/14/2023   Diabetic kidney evaluation - eGFR measurement  02/22/2024   FOOT EXAM  03/12/2024   Pneumonia Vaccine 75+ Years old  Completed   Hepatitis C Screening  Completed   HPV VACCINES  Aged Out     ----------------------------------------------------------------------------------------------------------------------------------------------------------------------------------------------------------------- Physical Exam BP 133/77 (BP Location: Left Arm, Patient Position: Sitting, Cuff Size: Large)   Pulse 99   Ht 5\' 8"  (1.727 m)   Wt 233 lb (105.7 kg)   SpO2 100%   BMI 35.43 kg/m   Physical Exam Constitutional:      Appearance: He is well-developed.  HENT:     Head: Normocephalic and atraumatic.  Eyes:     General: No scleral icterus. Cardiovascular:      Rate and Rhythm: Normal rate and regular rhythm.  Pulmonary:     Effort: Pulmonary effort is normal.     Breath sounds: Normal breath sounds.  Neurological:     General: No focal deficit present.     Mental Status: He is alert.  Psychiatric:        Mood and Affect: Mood normal.        Behavior: Behavior normal.     ------------------------------------------------------------------------------------------------------------------------------------------------------------------------------------------------------------------- Assessment and Plan  DM2 (diabetes mellitus, type 2) (HCC) This is being managed by endocrinology through the [place].  Based on previous A1c this has not been well-controlled in the past.  He is using a  CGM with improvement in readings recently.  Hypothyroidism Doing well with current strength of levothyroxine. Lab Results  Component Value Date   TSH 1.18 09/05/2022     Benign prostatic hyperplasia without lower urinary tract symptoms Continues to have some lower urinary tract symptoms.  Continue Flomax.  Increasing Myrbetriq to 50 mg.  Myositis He has had some increased thigh pain.  Recommend following up with his rheumatologist.  Benign essential HTN Blood pressure is fairly well-controlled at this time.  Recommend continuation of current medications.  Chronic combined systolic and diastolic CHF (congestive heart failure) (HCC) Management per cardiology.  Remains on Demadex as needed.  Also using lymphatic compression device for chronic lymphedema.  Dyspnea Likely viral etiology with possible allergy component as well.  Recommend supportive care with addition of over-the-counter Astepro.  Chest x-ray ordered.   Meds ordered this encounter  Medications   DISCONTD: mirabegron ER (MYRBETRIQ) 50 MG TB24 tablet    Sig: Take 1 tablet (50 mg total) by mouth daily.    Dispense:  90 tablet    Refill:  1   amitriptyline (ELAVIL) 25 MG tablet    Sig: Take  1 tab po qhs    Dispense:  90 tablet    Refill:  1   mirabegron ER (MYRBETRIQ) 50 MG TB24 tablet    Sig: Take 1 tablet (50 mg total) by mouth daily.    Dispense:  90 tablet    Refill:  1    No follow-ups on file.    This visit occurred during the SARS-CoV-2 public health emergency.  Safety protocols were in place, including screening questions prior to the visit, additional usage of staff PPE, and extensive cleaning of exam room while observing approMcAfeeHere today for follow-up visit. cardiogram up will follow-up in [place] granddaughter ma'am and notes his niece,No collateral [place]  evaluated only continueOvernight had 1 thank youpriate contact time as indicated for disinfecting solutions.

## 2023-03-14 ENCOUNTER — Ambulatory Visit (HOSPITAL_BASED_OUTPATIENT_CLINIC_OR_DEPARTMENT_OTHER): Payer: No Typology Code available for payment source | Admitting: Internal Medicine

## 2023-03-14 DIAGNOSIS — H25013 Cortical age-related cataract, bilateral: Secondary | ICD-10-CM | POA: Diagnosis not present

## 2023-03-14 DIAGNOSIS — H25043 Posterior subcapsular polar age-related cataract, bilateral: Secondary | ICD-10-CM | POA: Diagnosis not present

## 2023-03-14 DIAGNOSIS — H25812 Combined forms of age-related cataract, left eye: Secondary | ICD-10-CM | POA: Diagnosis not present

## 2023-03-14 DIAGNOSIS — H43813 Vitreous degeneration, bilateral: Secondary | ICD-10-CM | POA: Insufficient documentation

## 2023-03-14 DIAGNOSIS — H35372 Puckering of macula, left eye: Secondary | ICD-10-CM | POA: Diagnosis not present

## 2023-03-14 DIAGNOSIS — E113291 Type 2 diabetes mellitus with mild nonproliferative diabetic retinopathy without macular edema, right eye: Secondary | ICD-10-CM | POA: Diagnosis not present

## 2023-03-14 DIAGNOSIS — H401134 Primary open-angle glaucoma, bilateral, indeterminate stage: Secondary | ICD-10-CM | POA: Diagnosis not present

## 2023-03-14 DIAGNOSIS — H524 Presbyopia: Secondary | ICD-10-CM | POA: Diagnosis not present

## 2023-03-14 DIAGNOSIS — H348322 Tributary (branch) retinal vein occlusion, left eye, stable: Secondary | ICD-10-CM | POA: Diagnosis not present

## 2023-03-14 DIAGNOSIS — H2513 Age-related nuclear cataract, bilateral: Secondary | ICD-10-CM | POA: Diagnosis not present

## 2023-03-17 DIAGNOSIS — R0600 Dyspnea, unspecified: Secondary | ICD-10-CM | POA: Insufficient documentation

## 2023-03-17 NOTE — Assessment & Plan Note (Signed)
Management per cardiology.  Remains on Demadex as needed.  Also using lymphatic compression device for chronic lymphedema.

## 2023-03-17 NOTE — Assessment & Plan Note (Signed)
Doing well with current strength of levothyroxine. Lab Results  Component Value Date   TSH 1.18 09/05/2022

## 2023-03-17 NOTE — Assessment & Plan Note (Signed)
He has had some increased thigh pain.  Recommend following up with his rheumatologist.

## 2023-03-17 NOTE — Assessment & Plan Note (Signed)
This is being managed by endocrinology through the [place].  Based on previous A1c this has not been well-controlled in the past.  He is using a CGM with improvement in readings recently.

## 2023-03-17 NOTE — Assessment & Plan Note (Signed)
Blood pressure is fairly well controlled at this time.  Recommend continuation of current medications.

## 2023-03-17 NOTE — Assessment & Plan Note (Signed)
Continues to have some lower urinary tract symptoms.  Continue Flomax.  Increasing Myrbetriq to 50 mg.

## 2023-03-17 NOTE — Assessment & Plan Note (Signed)
Likely viral etiology with possible allergy component as well.  Recommend supportive care with addition of over-the-counter Astepro.  Chest x-ray ordered.

## 2023-03-19 ENCOUNTER — Encounter (HOSPITAL_BASED_OUTPATIENT_CLINIC_OR_DEPARTMENT_OTHER): Payer: Medicare Other | Admitting: Internal Medicine

## 2023-03-19 DIAGNOSIS — I87311 Chronic venous hypertension (idiopathic) with ulcer of right lower extremity: Secondary | ICD-10-CM | POA: Diagnosis not present

## 2023-03-19 DIAGNOSIS — E1151 Type 2 diabetes mellitus with diabetic peripheral angiopathy without gangrene: Secondary | ICD-10-CM | POA: Diagnosis not present

## 2023-03-19 DIAGNOSIS — Z86718 Personal history of other venous thrombosis and embolism: Secondary | ICD-10-CM | POA: Diagnosis not present

## 2023-03-19 DIAGNOSIS — S81801A Unspecified open wound, right lower leg, initial encounter: Secondary | ICD-10-CM | POA: Diagnosis not present

## 2023-03-19 DIAGNOSIS — I890 Lymphedema, not elsewhere classified: Secondary | ICD-10-CM | POA: Diagnosis not present

## 2023-03-19 DIAGNOSIS — S81802A Unspecified open wound, left lower leg, initial encounter: Secondary | ICD-10-CM | POA: Diagnosis not present

## 2023-03-19 NOTE — Progress Notes (Signed)
DAMAINE, [name] ([id number]) [id number] Page 1 of 6 Visit Report for 03/19/2023 HPI Details Patient Name: Date of Service: [name], [name] 03/19/2023 2:15 PM Medical Record Number: [id number] Patient Account Number: [id number] Date of Birth/Sex: Treating RN: 1950/05/28 (72 y.o. M) Primary Care Provider: Everrett Coombe Other Clinician: Referring Provider: Treating Provider/Extender: Shawna Clamp in Treatment: 23 History of Present Illness HPI Description: ADMISSION 10/04/2022 This is a 72 year old man who is quite disabled because of some form of myopathy. He follows for this with neurology. He has had biopsies that showed necrosis without inflammation. 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 In any case he is reasonably immobile at home. He has this apparatus to help him transfer. Because of difficulties getting him upstairs he has been sleeping in a recliner on the main floor. He also has a history of lymphedema and I see he was followed by PT for a period of time. He has a history of combined congestive heart failure but his last echo showed an EF of 60 to 65%. He has developed a wound on the right lateral leg over the last month or so. He also has a smaller wound on the right anterior lower leg. He is having problems with his left second toe and he has bilateral stage II ulcers over his ischial tuberosity. The patient is complaining of oral pain under his tongue. He has bilateral heel pain and increasing edema with weight gain over the past week or 2 Past medical history includes myopathy/myositis question inclusion body myositis, rheumatoid arthritis, coronary artery disease, massive pulmonary embolism, type 2 diabetes, lymphedema, combined congestive heart failure, bilateral foot drop PTSD ABIs in our clinic were 1.22 on the right 1.27 on the  left 6/17; patient presents for follow-up. He has been using silver alginate to the bilateral buttocks wounds. These have healed. We have also been using silver alginate to the right lower extremity under compression therapy. He is tolerated this well. The wounds are smaller. 6/25; patient presents for follow-up. We have been using silver alginate to the right lower extremity under compression therapy. The anterior leg wound has healed. The lateral right leg wound is smaller. 7/8; patient presents for follow-up. We have been using silver alginate under compression therapy to the right lower extremity wound and this is healed. Unfortunately he has developed a blister to the left lower extremity. 7/15; patient presents for follow-up. We have been using silver alginate under 4-layer compression to the left lower extremity. His wound has healed here. Unfortunately he has developed blistering to the right lower extremity and has open wounds despite using compression therapy daily here. 7/22; patient presents for follow-up. We have been using silver alginate under 4-layer compression to the right lower extremity. He has been using his zipper compression wrap to the left lower extremity daily. 2 wounds have healed on the right lower extremity. He has 1 remaining wound to the anterior aspect. He has no issues or complaints today. 8/2; patient presents for follow-up. We have been using silver alginate under Urgo K2 to the right lower extremity. His wounds have healed. He has zipper compression stockings to use. We have ordered him thigh-high Velcro compression wraps that are arriving in 2 weeks. 8/12; patient presents for follow-up. Unfortunately he has reopened wounds on both lower extremities. He had to stop his diuretic due to low blood pressure And now his lower  extremities are more edematous.. He has received his thigh-high Velcro compression wraps and lymphedema pumps. 8/22; patient presents for  follow-up. We have been using silver alginate under 4-layer compression to the lower extremities bilaterally. His wounds are smaller. He has no issues or complaints today. 8/27; patient presents for follow-up. We have been using silver alginate with antibiotic ointment under 4-layer equivalent compression wraps to the lower extremities bilaterally. The right lower extremity wound has healed. He still has a small open wound to the left lower extremity. He has his juxta fits with him today. This will be applied to the right leg. 9/5; patient presents for follow-up. He has been wearing juxta fits to the right lower extremity. Unfortunately he developed a blister with a new wound to the this leg. We have been using silver alginate with antibiotic ointment under 4-layer compression to the left lower extremity. The left lower extremity wound has healed. 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 He is on Eliquis and reports taking this daily. 10/8; the patient has no open wound on the right leg. He developed a new blister on the left leg which morphed into a new wound sometime in August although he was seen here on 9/5. He has juxta fit stockings and compression pumps but they tell me somebody told them not to use the compression pumps over stockings. I have never really heard this. In any case he is now here with a unroofed blister type wound on the left anterior mid tibia. 10/17; the patient was discharged with no open wound on the right leg however he developed a new open blister on the right leg now has a open wound in the mid part of the right leg. The area on the left leg is closed. He has systemic edema the cause of this is not really clear. 10/24; the patient has his 1 remaining wound on the right mid tibia. Nothing open on the left leg. Comes in with what his wife claims that his thigh-high external compression garments. He has a  Tubigrip under this. The compression garment running from his mid tibia to above his knee. She is insistent that we ordered these and put them on the last time. [name] ([id number]) [id number] Page 2 of 6 We have been using Hydrofera Blue according to my notes although it looks as though this patient had adherent silver alginate on the right leg wound. This was under a Urgo K2 wrap that was maintained 11/5; the patient's area on the right mid tibia is closed. He traumatized his left anterior leg while getting out of the shower there is a small open wound here just below where his original wound was on the left leg. He is using 3 layer juxta fit apparatuses right up into his upper thighs he also has compression pumps he saw his cardiologist, his diuretic doses were not altered i.e. torsemide 20 mg. 11/19; everything in the patient's bilateral lower legs including the most recent wound on the left anterior lower leg is closed. He has complete lower extremity juxta juxta fifths. Electronic Signature(s) Signed: 03/19/2023 4:19:19 PM By: Baltazar Najjar MD Entered By: Baltazar Najjar on 03/19/2023 12:03:33 -------------------------------------------------------------------------------- Physical Exam Details Patient Name: Date of Service: [name], [name] 03/19/2023 2:15 PM Medical Record Number: [id number] Patient Account Number: [id number] Date of Birth/Sex: Treating RN: 1950-11-02 (72 y.o. M) Primary Care Provider: Everrett Coombe Other Clinician: Referring Provider:  Treating Provider/Extender: Shawna Clamp in Treatment: 23 Cardiovascular Auscultation is normal JVP is borderline elevated he is 1-2+ sacral pitting edema.. Edema present in both extremities.This is pitting edema up into the upper thighs. Notes Wound exam; everything is closed including the most recent left anterior lower extremity wound. He has very significant edema in his  lower legs extending up to his thighs especially posteriorly he has 1-2+ pitting edema in his sacrum Electronic Signature(s) Signed: 03/19/2023 4:19:19 PM By: Baltazar Najjar MD Entered By: Baltazar Najjar on 03/19/2023 12:04:46 -------------------------------------------------------------------------------- Physician Orders Details Patient Name: Date of Service: [name], [name] 03/19/2023 2:15 PM Medical Record Number: [id number] Patient Account Number: [id number] Date of Birth/Sex: Treating RN: 09/22/1950 (72 y.o. Charlean Merl, Lauren Primary Care Provider: Everrett Coombe Other Clinician: Referring Provider: Treating Provider/Extender: Shawna Clamp in Treatment: 23 Verbal / Phone Orders: No Diagnosis Coding Discharge From [facility] Discharge from Wound Care Center Edema Control - Orders / Instructions Elevate legs to the level of the heart or above for 30 minutes daily and/or when sitting for 3-4 times a day throughout the day. Avoid standing for long periods of time. Patient to wear own compression stockings every day. Electronic Signature(s) Signed: 03/19/2023 3:54:17 PM By: Fonnie Mu RN Signed: 03/19/2023 4:19:19 PM By: Baltazar Najjar MD [place], [name] ([id number]) [id number] Page 3 of 6 Entered By: Fonnie Mu on 03/19/2023 11:55:24 -------------------------------------------------------------------------------- Problem List Details Patient Name: Date of Service: [name], [name] 03/19/2023 2:15 PM Medical Record Number: [id number] Patient Account Number: [id number] Date of Birth/Sex: Treating RN: [birth year] (72 y.o. M) Primary Care Provider: Everrett Coombe Other Clinician: Referring Provider: Treating Provider/Extender: Shawna Clamp in Treatment: 23 Active Problems ICD-10 Encounter Code Description Active Date MDM Diagnosis I87.312 Chronic venous hypertension (idiopathic) with ulcer  of left lower extremity 11/05/2022 No Yes I87.311 Chronic venous hypertension (idiopathic) with ulcer of right lower extremity 11/19/2022 No Yes S81.802A Unspecified open wound, left lower leg, initial encounter 11/05/2022 No Yes S81.801A Unspecified open wound, right lower leg, initial encounter 12/10/2022 No Yes I89.0 Lymphedema, not elsewhere classified 10/04/2022 No Yes 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, not elsewhere classified 10/04/2022 No Yes Inactive Problems Resolved Problems ICD-10 Code Description Active Date Resolved Date L89.312 Pressure ulcer of right buttock, stage 2 10/04/2022 10/04/2022 L89.322 Pressure ulcer of left buttock, stage 2 10/04/2022 10/04/2022 L97.818 Non-pressure chronic ulcer of other part of right lower leg with other specified severity 10/04/2022 10/04/2022 B37.0 Candidal stomatitis 10/04/2022 10/04/2022 Electronic Signature(s) Signed: 03/19/2023 4:19:19 PM By: Baltazar Najjar MD Entered By: Baltazar Najjar on 03/19/2023 12:02:39 [name] ([id number]) [id number] Page 4 of 6 -------------------------------------------------------------------------------- Progress Note Details Patient Name: Date of Service: [name], [name] 03/19/2023 2:15 PM Medical Record Number: [id number] Patient Account Number: [id number] Date of Birth/Sex: Treating RN: 04/10/1951 (72 y.o. M) Primary Care Provider: Everrett Coombe Other Clinician: Referring Provider: Treating Provider/Extender: Shawna Clamp in Treatment: 23 Subjective History of Present Illness (HPI) ADMISSION 10/04/2022 This is a 72 year old man who is quite disabled because of some form of myopathy. He follows for this with neurology. He has had biopsies that showed necrosis without inflammation. 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 In any case he is reasonably immobile  at home. He has this apparatus to help him transfer. Because of difficulties getting him upstairs he has been sleeping in a recliner on the  main floor. He also has a history of lymphedema and I see he was followed by PT for a period of time. He has a history of combined congestive heart failure but his last echo showed an EF of 60 to 65%. He has developed a wound on the right lateral leg over the last month or so. He also has a smaller wound on the right anterior lower leg. He is having problems with his left second toe and he has bilateral stage II ulcers over his ischial tuberosity. The patient is complaining of oral pain under his tongue. He has bilateral heel pain and increasing edema with weight gain over the past week or 2 Past medical history includes myopathy/myositis question inclusion body myositis, rheumatoid arthritis, coronary artery disease, massive pulmonary embolism, type 2 diabetes, lymphedema, combined congestive heart failure, bilateral foot drop PTSD ABIs in our clinic were 1.22 on the right 1.27 on the left 6/17; patient presents for follow-up. He has been using silver alginate to the bilateral buttocks wounds. These have healed. We have also been using silver alginate to the right lower extremity under compression therapy. He is tolerated this well. The wounds are smaller. 6/25; patient presents for follow-up. We have been using silver alginate to the right lower extremity under compression therapy. The anterior leg wound has healed. The lateral right leg wound is smaller. 7/8; patient presents for follow-up. We have been using silver alginate under compression therapy to the right lower extremity wound and this is healed. Unfortunately he has developed a blister to the left lower extremity. 7/15; patient presents for follow-up. We have been using silver alginate under 4-layer compression to the left lower extremity. His wound has healed here. Unfortunately he has developed  blistering to the right lower extremity and has open wounds despite using compression therapy daily here. 7/22; patient presents for follow-up. We have been using silver alginate under 4-layer compression to the right lower extremity. He has been using his zipper compression wrap to the left lower extremity daily. 2 wounds have healed on the right lower extremity. He has 1 remaining wound to the anterior aspect. He has no issues or complaints today. 8/2; patient presents for follow-up. We have been using silver alginate under Urgo K2 to the right lower extremity. His wounds have healed. He has zipper compression stockings to use. We have ordered him thigh-high Velcro compression wraps that are arriving in 2 weeks. 8/12; patient presents for follow-up. Unfortunately he has reopened wounds on both lower extremities. He had to stop his diuretic due to low blood pressure And now his lower extremities are more edematous.. He has received his thigh-high Velcro compression wraps and lymphedema pumps. 8/22; patient presents for follow-up. We have been using silver alginate under 4-layer compression to the lower extremities bilaterally. His wounds are smaller. He has no issues or complaints today. 8/27; patient presents for follow-up. We have been using silver alginate with antibiotic ointment under 4-layer equivalent compression wraps to the lower extremities bilaterally. The right lower extremity wound has healed. He still has a small open wound to the left lower extremity. He has his juxta fits with him today. This will be applied to the right leg. 9/5; patient presents for follow-up. He has been wearing juxta fits to the right lower extremity. Unfortunately he developed a blister with a new wound to the this leg. We have been using silver alginate with antibiotic ointment under 4-layer compression to the left lower extremity. The left lower extremity wound  has healed. He is complaining about right calf  tenderness for the past week and does have a history of DVT to the right leg. He is on Eliquis and reports taking this daily. 10/8; the patient has no open wound on the right leg. He developed a new blister on the left leg which morphed into a new wound sometime in August although he was seen here on 9/5. He has juxta fit stockings and compression pumps but they tell me somebody told them not to use the compression pumps over stockings. I have never really heard this. In any case he is now here with a unroofed blister type wound on the left anterior mid tibia. 10/17; the patient was discharged with no open wound on the right leg however he developed a new open blister on the right leg now has a open wound in the mid part of the right leg. The area on the left leg is closed. He has systemic edema the cause of this is not really clear. 10/24; the patient has his 1 remaining wound on the right mid tibia. Nothing open on the left leg. Comes in with what his wife claims that his thigh-high external compression garments. He has a Tubigrip under this. The compression garment running from his mid tibia to above his knee. She is insistent that we ordered these and put them on the last time. We have been using Hydrofera Blue according to my notes although it looks as though this patient had adherent silver alginate on the right leg wound. This was under a Urgo K2 wrap that was maintained 11/5; the patient's area on the right mid tibia is closed. He traumatized his left anterior leg while getting out of the shower there is a small open wound here just below where his original wound was on the left leg. He is using 3 layer juxta fit apparatuses right up into his upper thighs he also has compression pumps he [name], [name] ([id number]) [id number] Page 5 of 6 saw his cardiologist, his diuretic doses were not altered i.e. torsemide 20 mg. 11/19; everything in the patient's bilateral lower  legs including the most recent wound on the left anterior lower leg is closed. He has complete lower extremity juxta juxta fifths. Objective Constitutional Vitals Time Taken: 2:36 PM, Height: 68 in, Weight: 220 lbs, BMI: 33.4, Respiratory Rate: 17 breaths/min. Cardiovascular Auscultation is normal JVP is borderline elevated he is 1-2+ sacral pitting edema.. Edema present in both extremities.This is pitting edema up into the upper thighs. General Notes: Wound exam; everything is closed including the most recent left anterior lower extremity wound. He has very significant edema in his lower legs extending up to his thighs especially posteriorly he has 1-2+ pitting edema in his sacrum Integumentary (Hair, Skin) Wound #14 status is Open. Original cause of wound was Trauma. The date acquired was: 03/05/2023. The wound has been in treatment 2 weeks. The wound is located on the Left,Anterior Lower Leg. The wound measures 0cm length x 0cm width x 0cm depth; 0cm^2 area and 0cm^3 volume. There is a medium amount of serosanguineous drainage noted. The wound margin is distinct with the outline attached to the wound base. There is large (67-100%) red granulation within the wound bed. There is no necrotic tissue within the wound bed. The periwound skin appearance did not exhibit: Callus, Crepitus, Excoriation, Induration, Rash, Scarring, Dry/Scaly, Maceration, Atrophie Blanche, Cyanosis, Ecchymosis, Hemosiderin Staining, Mottled, Pallor, Rubor, Erythema. Assessment Active Problems ICD-10 Chronic venous hypertension (idiopathic) with  ulcer of left lower extremity Chronic venous hypertension (idiopathic) with ulcer of right lower extremity Unspecified open wound, left lower leg, initial encounter Unspecified open wound, right lower leg, initial encounter Lymphedema, not elsewhere classified Other inflammatory and immune myopathies, not elsewhere classified Plan [facility]charge From [facility]: Discharge from  Wound Care Center Edema Control - Orders / Instructions: Elevate legs to the level of the heart or above for 30 minutes daily and/or when sitting for 3-4 times a day throughout the day. Avoid standing for long periods of time. Patient to wear own compression stockings every day. 1. Everything is closed 2. The patient has a complete proximal and distal lower leg juxta fifths. He has these bilaterally. 3. I do not believe this patient has predominantly of lymphedema. I think this is a systemic fluid volume issue. His wife tells me he is on prednisone 9 mg for his inflammatory myopathy and that he has some degree of chronic renal failure. 4. [facility] I told him to mention the pitting edema that extends all the way up towards his groin. I think he needs some explanation for this systemic fluid volume overload. He needs more diuretics as well 5. I am discharging him today although I think there is a significant risk of recidivism here Electronic Signature(s) Signed: 03/19/2023 4:19:19 PM By: Baltazar Najjar MD Entered By: Baltazar Najjar on 03/19/2023 12:06:32 [name] ([id number]) [id number] Page 6 of 6 -------------------------------------------------------------------------------- SuperBill Details Patient Name: Date of Service: [name], [name] 03/19/2023 Medical Record Number: [id number] Patient Account Number: [id number] Date of Birth/Sex: Treating RN: 1950/06/17 (72 y.o. Charlean Merl, Lauren Primary Care Provider: Everrett Coombe Other Clinician: Referring Provider: Treating Provider/Extender: Shawna Clamp in Treatment: 23 Diagnosis Coding ICD-10 Codes Code Description [phone] Chronic venous hypertension (idiopathic) with ulcer of left lower extremity I87.311 Chronic venous hypertension (idiopathic) with ulcer of right lower extremity S81.802A Unspecified open wound, left lower leg, initial  encounter S81.801A Unspecified open wound, right lower leg, initial encounter I89.0 Lymphedema, not elsewhere classified 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, not elsewhere classified Facility Procedures : CPT4 Code: [id number] Description: 42595 - WOUND CARE VISIT-LEV 2 EST PT Modifier: Quantity: 1 Physician Procedures : CPT4 Code Description Modifier [id number] - WC PHYS LEVEL 3 - EST PT ICD-10 Diagnosis Description I87.312 Chronic venous hypertension (idiopathic) with ulcer of left lower extremity S81.802A Unspecified open wound, left lower leg, initial encounter  S81.801A Unspecified open wound, right lower leg, initial encounter Quantity: 1 Electronic Signature(s) Signed: 03/19/2023 4:19:19 PM By: Baltazar Najjar MD Entered By: Baltazar Najjar on 03/19/2023 12:06:59

## 2023-03-19 NOTE — Progress Notes (Signed)
[name], [name] ([id number]) [id number] Page 1 of 8 Visit Report for 03/19/2023 Arrival Information Details Patient Name: Date of Service: [name], [name] 03/19/2023 2:15 PM Medical Record Number: [id number] Patient Account Number: [id number] Date of Birth/Sex: Treating RN: 10-05-1950 (72 y.o. Charlean Merl, Lauren Primary Care Janeane Cozart: Everrett Coombe Other Clinician: Referring Trissa Molina: Treating Sherline Eberwein/Extender: Shawna Clamp in Treatment: 23 Visit Information History Since Last Visit Added or deleted any medications: No Patient Arrived: Wheel Chair Any new allergies or adverse reactions: No Arrival Time: 14:34 Had a fall or experienced change in No Accompanied By: daughter activities of daily living that may affect Transfer Assistance: Manual risk of falls: Patient Identification Verified: Yes Signs or symptoms of abuse/neglect since last visito No Secondary Verification Process Completed: Yes Hospitalized since last visit: No Patient Requires Transmission-Based Precautions: No Implantable device outside of the clinic excluding No Patient Has Alerts: Yes cellular tissue based products placed in the center Patient Alerts: ELIQUIS since last visit: Has Dressing in Place as Prescribed: Yes Pain Present Now: No Electronic Signature(s) Signed: 03/19/2023 3:54:17 PM By: Fonnie Mu RN Entered By: Fonnie Mu on 03/19/2023 11:36:50 -------------------------------------------------------------------------------- Clinic Level of Care Assessment Details Patient Name: Date of Service: [name], [name] 03/19/2023 2:15 PM Medical Record Number: [id number] Patient Account Number: [id number] Date of Birth/Sex: Treating RN: 01/19/1951 (72 y.o. Charlean Merl, Lauren Primary Care Stephane Junkins: Everrett Coombe Other Clinician: Referring Woody Kronberg: Treating Shermaine Rivet/Extender: Shawna Clamp in Treatment: 23 Clinic Level of  Care Assessment Items TOOL 4 Quantity Score X- 1 0 Use when only an EandM is performed on FOLLOW-UP visit ASSESSMENTS - Nursing Assessment / Reassessment X- 1 10 Reassessment of Co-morbidities (includes updates in patient status) X- 1 5 Reassessment of Adherence to Treatment Plan ASSESSMENTS - Wound and Skin A ssessment / Reassessment X - Simple Wound Assessment / Reassessment - one wound 1 5 []  - 0 Complex Wound Assessment / Reassessment - multiple wounds []  - 0 Dermatologic / Skin Assessment (not related to wound area) ASSESSMENTS - Focused Assessment []  - 0 Circumferential Edema Measurements - multi extremities []  - 0 Nutritional Assessment / Counseling / Intervention [place], [name] ([id number]) [id number] Page 2 of 8 []  - 0 Lower Extremity Assessment (monofilament, tuning fork, pulses) []  - 0 Peripheral Arterial Disease Assessment (using hand held doppler) ASSESSMENTS - Ostomy and/or Continence Assessment and Care []  - 0 Incontinence Assessment and Management []  - 0 Ostomy Care Assessment and Management (repouching, etc.) PROCESS - Coordination of Care X - Simple Patient / Family Education for ongoing care 1 15 []  - 0 Complex (extensive) Patient / Family Education for ongoing care X- 1 10 Staff obtains Chiropractor, Records, T Results / Process Orders est []  - 0 Staff telephones HHA, Nursing Homes / Clarify orders / etc []  - 0 Routine Transfer to another Facility (non-emergent condition) []  - 0 Routine Hospital Admission (non-emergent condition) []  - 0 New Admissions / Manufacturing engineer / Ordering NPWT Apligraf, etc. , []  - 0 Emergency Hospital Admission (emergent condition) X- 1 10 Simple Discharge Coordination []  - 0 Complex (extensive) Discharge Coordination PROCESS - Special Needs []  - 0 Pediatric / Minor Patient Management []  - 0 Isolation Patient Management []  - 0 Hearing / Language / Visual special needs []  -  0 Assessment of Community assistance (transportation, D/C planning, etc.) []  - 0 Additional assistance / Altered mentation []  - 0 Support Surface(s) Assessment (bed, cushion, seat, etc.) INTERVENTIONS - Wound Cleansing / Measurement X - Simple Wound Cleansing -  one wound 1 5 []  - 0 Complex Wound Cleansing - multiple wounds X- 1 5 Wound Imaging (photographs - any number of wounds) []  - 0 Wound Tracing (instead of photographs) X- 1 5 Simple Wound Measurement - one wound []  - 0 Complex Wound Measurement - multiple wounds INTERVENTIONS - Wound Dressings []  - 0 Small Wound Dressing one or multiple wounds []  - 0 Medium Wound Dressing one or multiple wounds []  - 0 Large Wound Dressing one or multiple wounds []  - 0 Application of Medications - topical []  - 0 Application of Medications - injection INTERVENTIONS - Miscellaneous []  - 0 External ear exam []  - 0 Specimen Collection (cultures, biopsies, blood, body fluids, etc.) []  - 0 Specimen(s) / Culture(s) sent or taken to Lab for analysis []  - 0 Patient Transfer (multiple staff / Nurse, adult / Similar devices) []  - 0 Simple Staple / Suture removal (25 or less) []  - 0 Complex Staple / Suture removal (26 or more) []  - 0 Hypo / Hyperglycemic Management (close monitor of Blood Glucose) [name], [name] ([id number]) [id number] Page 3 of 8 []  - 0 Ankle / Brachial Index (ABI) - do not check if billed separately X- 1 5 Vital Signs Has the patient been seen at the hospital within the last three years: Yes Total Score: 75 Level Of Care: New/Established - Level 2 Electronic Signature(s) Signed: 03/19/2023 3:54:17 PM By: Fonnie Mu RN Entered By: Fonnie Mu on 03/19/2023 11:56:40 -------------------------------------------------------------------------------- Encounter Discharge Information Details Patient Name: Date of Service: [name] 03/19/2023 2:15 PM Medical Record Number:  [id number] Patient Account Number: [id number] Date of Birth/Sex: Treating RN: August 02, 1950 (72 y.o. Charlean Merl, Lauren Primary Care Dorell Gatlin: Everrett Coombe Other Clinician: Referring Callen Zuba: Treating Edith Groleau/Extender: Shawna Clamp in Treatment: 23 Encounter Discharge Information Items Discharge Condition: Stable Ambulatory Status: Wheelchair Discharge Destination: Home Transportation: Private Auto Accompanied By: daughter Schedule Follow-up Appointment: Yes Clinical Summary of Care: Patient Declined Electronic Signature(s) Signed: 03/19/2023 3:54:17 PM By: Fonnie Mu RN Entered By: Fonnie Mu on 03/19/2023 11:57:14 -------------------------------------------------------------------------------- Lower Extremity Assessment Details Patient Name: Date of Service: [name], [name] 03/19/2023 2:15 PM Medical Record Number: [id number] Patient Account Number: [id number] Date of Birth/Sex: Treating RN: Jun 06, 1950 (72 y.o. Charlean Merl, Lauren Primary Care Reneta Niehaus: Everrett Coombe Other Clinician: Referring Zia Kanner: Treating Aubriel Khanna/Extender: Shawna Clamp in Treatment: 23 Edema Assessment Assessed: [name]: No] [name]: No] Edema: [Left: Yes] [Right: Yes] Calf Left: Right: Point of Measurement: 37 cm From Medial Instep 45 cm 40 cm Ankle Left: Right: Point of Measurement: 10 cm From Medial Instep 27.4 cm 24.4 cm Vascular Assessment Buffkin, Jyren ([id number]) [[id number] Page 4 of 8] Extremity colors, hair growth, and conditions: Extremity Color: [Left:Normal] [Right:Normal] Hair Growth on Extremity: [Left:No] [Right:No] Temperature of Extremity: [Left:Warm] [Right:Warm] Capillary Refill: [Left:< 3 seconds] [Right:< 3 seconds] Dependent Rubor: [Left:No No] [Right:No No] Electronic Signature(s) Signed: 03/19/2023 2:42:50 PM By: Fonnie Mu RN Entered By: Fonnie Mu on 03/19/2023  11:42:49 -------------------------------------------------------------------------------- Multi Wound Chart Details Patient Name: Date of Service: [name] 03/19/2023 2:15 PM Medical Record Number: [id number] Patient Account Number: [id number] Date of Birth/Sex: Treating RN: 1951/02/07 (72 y.o. M) Primary Care Koda Routon: Everrett Coombe Other Clinician: Referring Shavonta Gossen: Treating Haider Hornaday/Extender: Shawna Clamp in Treatment: 23 Vital Signs Height(in): 68 Pulse(bpm): Weight(lbs): 220 Blood Pressure(mmHg): Body Mass Index(BMI): 33.4 Temperature(F): Respiratory Rate(breaths/min): 17 [14:Photos:] [N/A:N/A] Left, Anterior Lower Leg N/A N/A Wound Location: Trauma N/A N/A Wounding Event: Trauma, Other N/A N/A Primary Etiology: Anemia,  Lymphedema, Congestive N/A N/A Comorbid History: Heart Failure, Deep Vein Thrombosis, Type II Diabetes, Rheumatoid Arthritis 03/05/2023 N/A N/A Date Acquired: 2 N/A N/A Weeks of Treatment: Open N/A N/A Wound Status: No N/A N/A Wound Recurrence: 0x0x0 N/A N/A Measurements L x W x D (cm) 0 N/A N/A A (cm) : rea 0 N/A N/A Volume (cm) : 100.00% N/A N/A % Reduction in A rea: 100.00% N/A N/A % Reduction in Volume: Partial Thickness N/A N/A Classification: Medium N/A N/A Exudate A mount: Serosanguineous N/A N/A Exudate Type: red, brown N/A N/A Exudate Color: Distinct, outline attached N/A N/A Wound Margin: Large (67-100%) N/A N/A Granulation A mount: Red N/A N/A Granulation Quality: None Present (0%) N/A N/A Necrotic A mount: Fascia: No N/A N/A Exposed Structures: Fat Layer (Subcutaneous Tissue): No Tendon: No Muscle: No Joint: No Bone: No None N/A N/A Epithelialization: Excoriation: No N/A N/A Periwound Skin TextureConley [name], [name] ([id number]) [id number] Page 5 of 8 Induration: No Callus: No Crepitus: No Rash: No Scarring: No Maceration: No N/A N/A Periwound Skin  Moisture: Dry/Scaly: No Atrophie Blanche: No N/A N/A Periwound Skin Color: Cyanosis: No Ecchymosis: No Erythema: No Hemosiderin Staining: No Mottled: No Pallor: No Rubor: No Treatment Notes Wound #14 (Lower Leg) Wound Laterality: Left, Anterior Cleanser Peri-Wound Care Topical Primary Dressing Secondary Dressing Secured With Compression Wrap Compression Stockings Add-Ons Electronic Signature(s) Signed: 03/19/2023 4:19:19 PM By: Baltazar Najjar MD Entered By: Baltazar Najjar on 03/19/2023 12:02:47 -------------------------------------------------------------------------------- Multi-Disciplinary Care Plan Details Patient Name: Date of Service: RAFFAEL, FARRAGHER 03/19/2023 2:15 PM Medical Record Number: [id number] Patient Account Number: [id number] Date of Birth/Sex: Treating RN: [birth year] (72 y.o. Charlean Merl, Lauren Primary Care Shaquan Puerta: Everrett Coombe Other Clinician: Referring Myosha Cuadras: Treating Marylynne Keelin/Extender: Shawna Clamp in Treatment: 23 Active Inactive Electronic Signature(s) Signed: 03/19/2023 3:54:17 PM By: Fonnie Mu RN Entered By: Fonnie Mu on 03/19/2023 11:55:35 -------------------------------------------------------------------------------- Pain Assessment Details Patient Name: Date of Service: [name], [name] 03/19/2023 2:15 PM Elizondo, [name] ([id number]) [id number] Page 6 of 8 Medical Record Number: [id number] Patient Account Number: [id number] Date of Birth/Sex: Treating RN: [birth year] (72 y.o. Charlean Merl, Lauren Primary Care Lamorris Knoblock: Everrett Coombe Other Clinician: Referring Jamelah Sitzer: Treating Shereta Crothers/Extender: Shawna Clamp in Treatment: 23 Active Problems Location of Pain Severity and Description of Pain Patient Has Paino No Site Locations Pain Management and Medication Current Pain Management: Electronic Signature(s) Signed: 03/19/2023 2:42:43 PM By:  Fonnie Mu RN Entered By: Fonnie Mu on 03/19/2023 11:42:43 -------------------------------------------------------------------------------- Patient/Caregiver Education Details Patient Name: Date of Service: [name], [name] 11/19/2024andnbsp2:15 PM Medical Record Number: [id number] Patient Account Number: [id number] Date of Birth/Gender: Treating RN: December 02, 1950 (72 y.o. Lucious Groves Primary Care Physician: Everrett Coombe Other Clinician: Referring Physician: Treating Physician/Extender: Shawna Clamp in Treatment: 23 Education Assessment Education Provided To: Patient Education Topics Provided Wound/Skin Impairment: Methods: Explain/Verbal Responses: State content correctly Electronic Signature(s) Signed: 03/19/2023 3:54:17 PM By: Fonnie Mu RN Entered By: Fonnie Mu on 03/19/2023 11:55:50 [name], [name] ([id number]) [id number] Page 7 of 8 -------------------------------------------------------------------------------- Wound Assessment Details Patient Name: Date of Service: [name], [name] 03/19/2023 2:15 PM Medical Record Number: [id number] Patient Account Number: [id number] Date of Birth/Sex: Treating RN: Sep 24, 1950 (72 y.o. Charlean Merl, Lauren Primary Care Clayson Riling: Everrett Coombe Other Clinician: Referring Zetha Kuhar: Treating Nekeshia Lenhardt/Extender: Shawna Clamp in Treatment: 23 Wound Status Wound Number: 14 Primary Trauma, Other Etiology: Wound Location: Left, Anterior Lower Leg Wound Open Wounding Event: Trauma Status: Date Acquired: 03/05/2023 Comorbid Anemia, Lymphedema, Congestive Heart Failure, Deep  Vein Weeks Of Treatment: 2 History: Thrombosis, Type II Diabetes, Rheumatoid Arthritis Clustered Wound: No Photos Wound Measurements Length: (cm) Width: (cm) Depth: (cm) Area: (cm) Volume: (cm) 0 % Reduction in Area: 100% 0 % Reduction in Volume: 100% 0  Epithelialization: None 0 0 Wound Description Classification: Partial Thickness Wound Margin: Distinct, outline attached Exudate Amount: Medium Exudate Type: Serosanguineous Exudate Color: red, brown Foul Odor After Cleansing: No Slough/Fibrino No Wound Bed Granulation Amount: Large (67-100%) Exposed Structure Granulation Quality: Red Fascia Exposed: No Necrotic Amount: None Present (0%) Fat Layer (Subcutaneous Tissue) Exposed: No Tendon Exposed: No Muscle Exposed: No Joint Exposed: No Bone Exposed: No Periwound Skin Texture Texture Color No Abnormalities Noted: No No Abnormalities Noted: No Callus: No Atrophie Blanche: No Crepitus: No Cyanosis: No Excoriation: No Ecchymosis: No Induration: No Erythema: No Rash: No Hemosiderin Staining: No Scarring: No Mottled: No Pallor: No Moisture Rubor: No No Abnormalities Noted: No Dry / Scaly: No Maceration: No Hyndman, Hriday ([id number]) [id number] Page 8 of 8 Electronic Signature(s) Signed: 03/19/2023 3:54:17 PM By: Fonnie Mu RN Entered By: Fonnie Mu on 03/19/2023 11:49:36 -------------------------------------------------------------------------------- Vitals Details Patient Name: Date of Service: [name], CIOFFI 03/19/2023 2:15 PM Medical Record Number: [id number] Patient Account Number: [id number] Date of Birth/Sex: Treating RN: Jul 01, 1950 (72 y.o. Charlean Merl, Lauren Primary Care Gerilynn Mccullars: Everrett Coombe Other Clinician: Referring Verdis Koval: Treating Rommel Hogston/Extender: Shawna Clamp in Treatment: 23 Vital Signs Time Taken: 14:36 Respiratory Rate (breaths/min): 17 Height (in): 68 Reference Range: 80 - 120 mg / dl Weight (lbs): 732 Body Mass Index (BMI): 33.4 Electronic Signature(s) Signed: 03/19/2023 3:54:17 PM By: Fonnie Mu RN Entered By: Fonnie Mu on 03/19/2023 11:37:01

## 2023-04-02 ENCOUNTER — Ambulatory Visit (HOSPITAL_BASED_OUTPATIENT_CLINIC_OR_DEPARTMENT_OTHER): Payer: No Typology Code available for payment source | Admitting: Internal Medicine

## 2023-04-12 ENCOUNTER — Encounter (HOSPITAL_BASED_OUTPATIENT_CLINIC_OR_DEPARTMENT_OTHER): Payer: Self-pay

## 2023-04-15 DIAGNOSIS — R110 Nausea: Secondary | ICD-10-CM | POA: Diagnosis not present

## 2023-04-15 DIAGNOSIS — R300 Dysuria: Secondary | ICD-10-CM | POA: Diagnosis not present

## 2023-04-15 DIAGNOSIS — R109 Unspecified abdominal pain: Secondary | ICD-10-CM | POA: Diagnosis not present

## 2023-04-15 DIAGNOSIS — Z91013 Allergy to seafood: Secondary | ICD-10-CM | POA: Diagnosis not present

## 2023-04-15 DIAGNOSIS — K6289 Other specified diseases of anus and rectum: Secondary | ICD-10-CM | POA: Diagnosis not present

## 2023-04-15 DIAGNOSIS — Z888 Allergy status to other drugs, medicaments and biological substances status: Secondary | ICD-10-CM | POA: Diagnosis not present

## 2023-04-15 DIAGNOSIS — K5900 Constipation, unspecified: Secondary | ICD-10-CM | POA: Diagnosis not present

## 2023-04-15 DIAGNOSIS — Z885 Allergy status to narcotic agent status: Secondary | ICD-10-CM | POA: Diagnosis not present

## 2023-04-15 DIAGNOSIS — K2289 Other specified disease of esophagus: Secondary | ICD-10-CM | POA: Diagnosis not present

## 2023-04-17 ENCOUNTER — Telehealth: Payer: Self-pay

## 2023-04-17 NOTE — Transitions of Care (Post Inpatient/ED Visit) (Signed)
   04/17/2023  Name: [name] MRN: [id number] DOB: [birth year]  Today's TOC FU Call Status: Today's TOC FU Call Status:: Unsuccessful Call (1st Attempt) Unsuccessful Call (1st Attempt) Date: 04/17/23  Attempted to reach the patient regarding the most recent Inpatient/ED visit.  Follow Up Plan: Additional outreach attempts will be made to reach the patient to complete the Transitions of Care (Post Inpatient/ED visit) call.   Signature Karena Addison, LPN [facility] Nurse Health Advisor Direct Dial [phone]

## 2023-04-18 ENCOUNTER — Other Ambulatory Visit: Payer: Self-pay

## 2023-04-18 ENCOUNTER — Ambulatory Visit: Payer: Self-pay | Admitting: Family Medicine

## 2023-04-18 ENCOUNTER — Encounter (HOSPITAL_BASED_OUTPATIENT_CLINIC_OR_DEPARTMENT_OTHER): Payer: Self-pay

## 2023-04-18 ENCOUNTER — Emergency Department (HOSPITAL_BASED_OUTPATIENT_CLINIC_OR_DEPARTMENT_OTHER)
Admission: EM | Admit: 2023-04-18 | Discharge: 2023-04-18 | Disposition: A | Discharge status: Home / Self Care | Payer: No Typology Code available for payment source | Attending: Emergency Medicine | Admitting: Emergency Medicine

## 2023-04-18 DIAGNOSIS — Z7901 Long term (current) use of anticoagulants: Secondary | ICD-10-CM | POA: Insufficient documentation

## 2023-04-18 DIAGNOSIS — R1030 Lower abdominal pain, unspecified: Secondary | ICD-10-CM | POA: Diagnosis present

## 2023-04-18 DIAGNOSIS — Z79899 Other long term (current) drug therapy: Secondary | ICD-10-CM | POA: Insufficient documentation

## 2023-04-18 DIAGNOSIS — Z7984 Long term (current) use of oral hypoglycemic drugs: Secondary | ICD-10-CM | POA: Insufficient documentation

## 2023-04-18 DIAGNOSIS — K5900 Constipation, unspecified: Secondary | ICD-10-CM | POA: Insufficient documentation

## 2023-04-18 DIAGNOSIS — Z794 Long term (current) use of insulin: Secondary | ICD-10-CM | POA: Diagnosis not present

## 2023-04-18 LAB — CBC WITH DIFFERENTIAL/PLATELET
Abs Immature Granulocytes: 0.06 10*3/uL (ref 0.00–0.07)
Basophils Absolute: 0 10*3/uL (ref 0.0–0.1)
Basophils Relative: 0 %
Eosinophils Absolute: 0.1 10*3/uL (ref 0.0–0.5)
Eosinophils Relative: 1 %
HCT: 29.6 % — ABNORMAL LOW (ref 39.0–52.0)
Hemoglobin: 9.8 g/dL — ABNORMAL LOW (ref 13.0–17.0)
Immature Granulocytes: 1 %
Lymphocytes Relative: 12 %
Lymphs Abs: 0.8 10*3/uL (ref 0.7–4.0)
MCH: 30.7 pg (ref 26.0–34.0)
MCHC: 33.1 g/dL (ref 30.0–36.0)
MCV: 92.8 fL (ref 80.0–100.0)
Monocytes Absolute: 0.4 10*3/uL (ref 0.1–1.0)
Monocytes Relative: 6 %
Neutro Abs: 5.6 10*3/uL (ref 1.7–7.7)
Neutrophils Relative %: 80 %
Platelets: 211 10*3/uL (ref 150–400)
RBC: 3.19 MIL/uL — ABNORMAL LOW (ref 4.22–5.81)
RDW: 15 % (ref 11.5–15.5)
WBC: 7 10*3/uL (ref 4.0–10.5)
nRBC: 0 % (ref 0.0–0.2)

## 2023-04-18 LAB — URINALYSIS, ROUTINE W REFLEX MICROSCOPIC
Bilirubin Urine: NEGATIVE
Glucose, UA: NEGATIVE mg/dL
Hgb urine dipstick: NEGATIVE
Ketones, ur: NEGATIVE mg/dL
Leukocytes,Ua: NEGATIVE
Nitrite: NEGATIVE
Protein, ur: NEGATIVE mg/dL
Specific Gravity, Urine: <=1.005 (ref 1.005–1.030)
pH: 5.5 (ref 5.0–8.0)

## 2023-04-18 LAB — BASIC METABOLIC PANEL
Anion gap: 10 (ref 5–15)
BUN: 28 mg/dL — ABNORMAL HIGH (ref 8–23)
CO2: 22 mmol/L (ref 22–32)
Calcium: 9.4 mg/dL (ref 8.9–10.3)
Chloride: 105 mmol/L (ref 98–111)
Creatinine, Ser: 1.38 mg/dL — ABNORMAL HIGH (ref 0.61–1.24)
GFR, Estimated: 54 mL/min — ABNORMAL LOW (ref >60)
Glucose, Bld: 218 mg/dL — ABNORMAL HIGH (ref 70–99)
Potassium: 5.3 mmol/L — ABNORMAL HIGH (ref 3.5–5.1)
Sodium: 137 mmol/L (ref 135–145)

## 2023-04-18 MED ORDER — LIDOCAINE HCL URETHRAL/MUCOSAL 2 % EX GEL
1.0000 | Freq: Once | CUTANEOUS | Status: AC
  Administered 2023-04-18: 1
  Filled 2023-04-18: qty 11

## 2023-04-18 MED ORDER — FENTANYL CITRATE PF 50 MCG/ML IJ SOSY
50.0000 ug | PREFILLED_SYRINGE | Freq: Once | INTRAMUSCULAR | Status: DC
Start: 2023-04-18 — End: 2023-04-19
  Filled 2023-04-18: qty 1

## 2023-04-18 MED ORDER — FLEET ENEMA RE ENEM
1.0000 | ENEMA | Freq: Once | RECTAL | Status: AC
  Administered 2023-04-18: 1 via RECTAL
  Filled 2023-04-18: qty 1

## 2023-04-18 NOTE — ED Provider Notes (Signed)
[place] [facility] Provider Note   CSN: [id number] Arrival date & time: 04/18/23  1712     History  Chief Complaint  Patient presents with   Abdominal Pain   Constipation    [name] is a 72 y.o. male.  He is brought in by his wife for lower abdominal pain urinary frequency constipation.  He has not had a bowel movement in 12 days.  She blames the Ozempic that he is on.  He was seen at [facility] and had a CAT scan and a rectal disimpaction and enema.  Symptoms continue.  They are asking for another disimpaction.  She said his blood sugars have been dropping at times because he is not eating because of the constipation.  Nausea and vomiting a lot of reflux.  The history is provided by the patient.  Abdominal Pain Pain quality: cramping   Pain severity:  Moderate Onset quality:  Gradual Duration:  1 week Timing:  Intermittent Progression:  Unchanged Chronicity:  New Relieved by:  Nothing Associated symptoms: constipation, nausea and vomiting   Associated symptoms: no chest pain, no dysuria, no fever and no shortness of breath   Constipation Associated symptoms: abdominal pain, nausea and vomiting   Associated symptoms: no dysuria and no fever        Home Medications Prior to Admission medications   Medication Sig Start Date End Date Taking? Authorizing Provider  acarbose (PRECOSE) 100 MG tablet Take 100 mg by mouth 3 (three) times daily with meals.    [provider]  acetaminophen (TYLENOL) 500 MG tablet Take 500-1,000 mg by mouth See admin instructions. Take 1000 mg by mouth in the morning and 500 mg at bedtime    [provider]  AMBULATORY NON FORMULARY MEDICATION Please provide 3 in 1 bedside commode.  Dx:R29.6, M62.81 04/16/22   Everrett Coombe, DO  AMBULATORY NON FORMULARY MEDICATION Please provide bilateral thigh high compression stockings: Graded compression of 20-26mmhg 09/05/22   Everrett Coombe, DO   amitriptyline (ELAVIL) 25 MG tablet Take 1 tab po qhs 03/13/23   Everrett Coombe, DO  apixaban (ELIQUIS) 5 MG TABS tablet Take 1 tablet (5 mg total) by mouth 2 (two) times daily. 08/09/22   Alver Sorrow, NP  baclofen (LIORESAL) 20 MG tablet TAKE 1 TABLET BY MOUTH TWICE A DAY 01/30/22   Everrett Coombe, DO  calcium carbonate (OS-CAL) 600 MG TABS tablet Take 600 mg by mouth daily.    [provider]  Calcium Carbonate Antacid (TUMS PO) Take 3 tablets by mouth 2 (two) times daily as needed (heartburn).    [provider]  folic acid (FOLVITE) 1 MG tablet Take 2 mg by mouth daily.    [provider]  insulin glargine (LANTUS) 100 UNIT/ML injection Inject 0.38 mLs (38 Units total) into the skin daily. 09/15/22   Glade Lloyd, MD  insulin glargine-yfgn (SEMGLEE) 100 UNIT/ML Pen Inject into the skin. 01/17/23   [provider]  latanoprost (XALATAN) 0.005 % ophthalmic solution 1 drop at bedtime. 08/17/22   [provider]  Levothyroxine Sodium 75 MCG CAPS Take 75 mcg by mouth daily.    [provider]  loratadine (CLARITIN) 10 MG tablet Take 10 mg by mouth daily. 12/12/21   [provider]  metFORMIN (GLUCOPHAGE-XR) 500 MG 24 hr tablet Take by mouth. 09/25/22   [provider]  methotrexate (RHEUMATREX) 5 MG tablet Take 3 tablets by mouth once a week. Caution: Chemotherapy. Protect from  light.    [provider]  metoprolol succinate (TOPROL XL) 25 MG 24 hr tablet Take 0.5 tablets (12.5 mg total) by mouth daily. 11/20/22   Alver Sorrow, NP  mirabegron ER (MYRBETRIQ) 50 MG TB24 tablet Take 1 tablet (50 mg total) by mouth daily. 03/13/23   Everrett Coombe, DO  Multiple Vitamins-Minerals (PRESERVISION AREDS 2) CAPS Take 2 caps po daily 12/05/22   Everrett Coombe, DO  pantoprazole (PROTONIX) 40 MG tablet Take 1 tablet (40 mg total) by mouth daily. 09/15/22   Glade Lloyd, MD  polyvinyl alcohol (LIQUIFILM TEARS) 1.4 % ophthalmic  solution Place 1 drop into both eyes in the morning and at bedtime.    [provider]  predniSONE (DELTASONE) 10 MG tablet Take 10 mg by mouth daily with breakfast. Take with 5 mg tablet to equal 15 mg    [provider]  QUEtiapine (SEROQUEL) 50 MG tablet Take by mouth. 09/21/22   [provider]  Semaglutide,0.25 or 0.5MG /DOS, 2 MG/3ML SOPN Inject 0.25 mLs into the skin once a week. 01/17/23   [provider]  sertraline (ZOLOFT) 25 MG tablet Take 2 tablets (50 mg total) by mouth daily. Take 50mg  daily. 09/15/22   Glade Lloyd, MD  tamsulosin (FLOMAX) 0.4 MG CAPS capsule Take by mouth. 09/21/22   [provider]  torsemide (DEMADEX) 20 MG tablet TAKE 1 TABLET (20 MG TOTAL) BY MOUTH DAILY. MAY TAKE ADDITIONAL TABLET AS DIRECTED BY LYMPHEDEMA CLINIC OR CARDIOLOGIST. 01/08/23 10/05/23  Alver Sorrow, NP  triamcinolone (KENALOG) 0.1 % paste Apply topically. 09/21/22   [provider]  triamcinolone cream (KENALOG) 0.1 % Apply 1 Application topically 2 (two) times daily. Apply bid as needed for rash 12/05/22   Everrett Coombe, DO      Allergies    Benazepril, Gabapentin, Hydrocodone, and Shellfish allergy    Review of Systems   Review of Systems  Constitutional:  Negative for fever.  Respiratory:  Negative for shortness of breath.   Cardiovascular:  Negative for chest pain.  Gastrointestinal:  Positive for abdominal pain, constipation, nausea and vomiting.  Genitourinary:  Positive for frequency. Negative for dysuria.    Physical Exam Updated Vital Signs BP (!) 152/76   Pulse 97   Temp 98.4 F (36.9 C) (Oral)   Resp 18   Wt 105.7 kg   SpO2 96%   BMI 35.43 kg/m  Physical Exam Vitals and nursing note reviewed.  Constitutional:      General: He is not in acute distress.    Appearance: Normal appearance. He is well-developed.  HENT:     Head: Normocephalic and atraumatic.  Eyes:     Conjunctiva/sclera: Conjunctivae normal.   Cardiovascular:     Rate and Rhythm: Normal rate and regular rhythm.     Heart sounds: No murmur heard. Pulmonary:     Effort: Pulmonary effort is normal. No respiratory distress.     Breath sounds: Normal breath sounds.  Abdominal:     Palpations: Abdomen is soft.     Tenderness: There is no abdominal tenderness. There is no guarding or rebound.  Musculoskeletal:        General: No deformity.     Cervical back: Neck supple.  Skin:    General: Skin is warm and dry.     Capillary Refill: Capillary refill takes less than 2 seconds.  Neurological:     General: No focal deficit present.     Mental Status: He is alert.  ED Results / Procedures / Treatments   Labs (all labs ordered are listed, but only abnormal results are displayed) Labs Reviewed  BASIC METABOLIC PANEL - Abnormal; Notable for the following components:      Result Value   Potassium 5.3 (*)    Glucose, Bld 218 (*)    BUN 28 (*)    Creatinine, Ser 1.38 (*)    GFR, Estimated 54 (*)    All other components within normal limits  CBC WITH DIFFERENTIAL/PLATELET - Abnormal; Notable for the following components:   RBC 3.19 (*)    Hemoglobin 9.8 (*)    HCT 29.6 (*)    All other components within normal limits  URINALYSIS, ROUTINE W REFLEX MICROSCOPIC    EKG None  Radiology No results found.  Procedures Procedures    Medications Ordered in ED Medications  lidocaine (XYLOCAINE) 2 % jelly 1 Application (1 Application Other Given by Other 04/18/23 1802)  sodium phosphate (FLEET) enema 1 enema (1 enema Rectal Given 04/18/23 1829)    ED Course/ Medical Decision Making/ A&P Clinical Course as of 04/19/23 1000  Thu Apr 18, 2023  1807 Patient had some hard stool in the vault but it was higher up than I could reach.  Will order him an enema. [MB]  1937 Patient had a fairly large bowel movement with fleets enema. [MB]  1946 Patient is feeling much better and wife is comfortable taking him home.  She will work  on keeping him hydrated and try some more bowel regiment.  Labs showing mild elevation of potassium and slightly elevated creatinine. [MB]    Clinical Course User Index [MB] Terrilee Files, MD                                 Medical Decision Making Amount and/or Complexity of Data Reviewed Labs: ordered.  Risk OTC drugs. Prescription drug management.   This patient complains of constipation and rectal pain; this involves an extensive number of treatment Options and is a complaint that carries with it a high risk of complications and morbidity. The differential includes constipation, fecal impaction, proctitis, UTI, urinary retention  I ordered, reviewed and interpreted labs, which included CBC with normal white count stable low hemoglobin, chemistries with elevated potassium glucose BUN and creatinine, urinalysis without signs of infection I ordered medication topical lido and fleets enema and reviewed PMP when indicated. Additional history obtained from patient's wife Previous records obtained and reviewed in epic including ED note from 3 days ago where he had a full set of labs x-ray and CT imaging of his abdomen and pelvis Cardiac monitoring reviewed, sinus rhythm Social determinants considered, social isolation Critical Interventions: None  After the interventions stated above, I reevaluated the patient and found patient to have had a large bowel movement and feeling clinically better Admission and further testing considered, patient and wife comfortable plan for discharge.  Will encourage bowel regiment and close follow-up with her treatment team.  Return instructions discussed         Final Clinical Impression(s) / ED Diagnoses Final diagnoses:  Constipation, unspecified constipation type  Lower abdominal pain    Rx / DC Orders ED Discharge Orders     None         Terrilee Files, MD 04/19/23 1003

## 2023-04-18 NOTE — ED Triage Notes (Signed)
Pt arrives with c/o ABD pain and constipation. Per wife, pt has not had a BM in 12 days. Pt started taking ozempic for his diabetes and developed issues with having BMs. Wife also reports low glucose readings, AMS, distended ABD, and n/v. Pt seen in ED recently and given enema without relief or BM per wife. Wife reports poor PO intake over the past week. Pt is a&ox3.

## 2023-04-18 NOTE — ED Notes (Signed)
Reviewed D/C information with the patient & S/O, both verbalized understanding. No additional concerns at this time. Pt provided paper scrubs, a chuck pad, and escorted to his vehicle via wheelchair.

## 2023-04-18 NOTE — Telephone Encounter (Signed)
Chief Complaint: Constipation, abd pain Symptoms: Unable to pass stool, abd pain, vomiting, decreased appetite Frequency: 12 days since last BM Pertinent Negatives: Patient denies fever Disposition: [x] ED /[] Urgent Care (no appt availability in office) / [] Appointment(In office/virtual)/ []  [facility]/ [] Home Care/ [] Refused Recommended Disposition /[] [facility]/ []  Follow-up with PCP Additional Notes: Pt's spouse calls to report that pt was seen in ED Monday for constipation. This started approx 3 weeks ago when the pt started Ozempic, after his second dose he was unable to have BM and has not had one in 12 days. Pt is very uncomfortable, unable to sit and walk normally, abdomen is distended, firm, tender to touch. Pt has vomited but wife reports he swallows it and she has been unable to see color or consistency, watery stools occasionally when trying to pass stool. Pt was given a large enema in the ED on Monday. Pt's wife reports decreased appetite resulting in lower blood sugars. This RN educated on need for immediate ED eval/treat and pt and wife verbalized understanding and agrees to plan. Wife will transport pt to ED now.   Copied from CRM [id number]. Topic: Clinical - Red Word Triage >> Apr 18, 2023  2:47 PM Gaetano Hawthorne wrote: Red Word that prompted transfer to Nurse Triage: Patient mentioned that he is still not going to the bathroom and experiencing a lot of stress and anxiety - Patient recently went to the ED. Reason for Disposition  [1] Vomiting AND [2] abdomen looks much more swollen than usual  Answer Assessment - Initial Assessment Questions 1. STOOL PATTERN OR FREQUENCY: "How often do you have a bowel movement (BM)?"  (Normal range: 3 times a day to every 3 days)  "When was your last BM?"       2-3 times a day 2. STRAINING: "Do you have to strain to have a BM?"      Yes 3. RECTAL PAIN: "Does your rectum hurt when the stool comes out?" If Yes, ask: "Do you have  hemorrhoids? How bad is the pain?"  (Scale 1-10; or mild, moderate, severe)     Unable to sit due to discomfort 4. STOOL COMPOSITION: "Are the stools hard?"      Watery 5. BLOOD ON STOOLS: "Has there been any blood on the toilet tissue or on the surface of the BM?" If Yes, ask: "When was the last time?"     None 6. CHRONIC CONSTIPATION: "Is this a new problem for you?"  If No, ask: "How long have you had this problem?" (days, weeks, months)      12 days since last BM 7. CHANGES IN DIET OR HYDRATION: "Have there been any recent changes in your diet?" "How much fluids are you drinking on a daily basis?"  "How much have you had to drink today?"     Unable to eat, low blood sugars. Has been drinking fluids 8. MEDICINES: "Have you been taking any new medicines?" "Are you taking any narcotic pain medicines?" (e.g., Dilaudid, morphine, Percocet, Vicodin)     Ozempic started 3 weeks ago 9. LAXATIVES: "Have you been using any stool softeners, laxatives, or enemas?"  If Yes, ask "What, how often, and when was the last time?"     Enema in ED on Monday 10. ACTIVITY:  "How much walking do you do every day?"  "Has your activity level decreased in the past week?"        Activity level decreased, difficult to move around 11. CAUSE: "What  do you think is causing the constipation?"        Pt believes it's related to Ozempic 12. OTHER SYMPTOMS: "Do you have any other symptoms?" (e.g., abdomen pain, bloating, fever, vomiting)       Heartburn, abd pain, vomiting, bloating, rigid/tender  Protocols used: Constipation-A-AH

## 2023-04-18 NOTE — ED Notes (Signed)
Pt assisted off the BSC with Robin, NT. Pt had a moderate sized BM, pt was cleaned and assisted back in bed. Call bell within reach.

## 2023-04-18 NOTE — ED Notes (Signed)
ED Provider at bedside. 

## 2023-04-18 NOTE — Transitions of Care (Post Inpatient/ED Visit) (Signed)
   04/18/2023  Name: [name] MRN: [id number] DOB: 07/15/1950  Today's TOC FU Call Status: Today's TOC FU Call Status:: Unsuccessful Call (2nd Attempt) Unsuccessful Call (1st Attempt) Date: 04/17/23 Unsuccessful Call (2nd Attempt) Date: 04/18/23  Attempted to reach the patient regarding the most recent Inpatient/ED visit.  Follow Up Plan: Additional outreach attempts will be made to reach the patient to complete the Transitions of Care (Post Inpatient/ED visit) call.   Signature Karena Addison, LPN [facility] Nurse Health Advisor Direct Dial [phone]

## 2023-04-18 NOTE — Discharge Instructions (Signed)
Please follow up with your primary care doctor within 2-3 days. For constipation we also recommend a diet high in fiber (beans, fruits, vegetables, whole grains). Take Colace 100-200 mg up to three times per day. You may take along with Senokot 1-2 tabs, ingest with full glass of water.  You may also take MiraLAX 1-2 capfuls 1-2 times a day until stools become soft and then slowly decrease the amount of MiraLAX used.  Maintain fluid intake 6-8 glasses per day. Please increase fibers in your diet. You may also take Milk of Magnesia 30 mL as needed for constipation, you may repeat in 2 hours again if no bowl movement.  

## 2023-04-19 NOTE — Transitions of Care (Post Inpatient/ED Visit) (Signed)
   04/19/2023  Name: [name] MRN: [id number] DOB: Nov 16, 1950  Today's TOC FU Call Status: Today's TOC FU Call Status:: Unsuccessful Call (3rd Attempt) Unsuccessful Call (1st Attempt) Date: 04/17/23 Unsuccessful Call (2nd Attempt) Date: 04/18/23 Unsuccessful Call (3rd Attempt) Date: 04/19/23  Attempted to reach the patient regarding the most recent Inpatient/ED visit.  Follow Up Plan: No further outreach attempts will be made at this time. We have been unable to contact the patient.  Signature Karena Addison, LPN [facility] Nurse Health Advisor Direct Dial [phone]

## 2023-04-25 ENCOUNTER — Encounter: Payer: Self-pay | Admitting: Family Medicine

## 2023-04-25 ENCOUNTER — Ambulatory Visit (INDEPENDENT_AMBULATORY_CARE_PROVIDER_SITE_OTHER): Payer: Medicare Other | Admitting: Family Medicine

## 2023-04-25 VITALS — BP 133/71 | HR 91 | Ht 68.0 in | Wt 233.0 lb

## 2023-04-25 DIAGNOSIS — K5900 Constipation, unspecified: Secondary | ICD-10-CM | POA: Diagnosis not present

## 2023-04-25 DIAGNOSIS — K219 Gastro-esophageal reflux disease without esophagitis: Secondary | ICD-10-CM | POA: Diagnosis not present

## 2023-04-25 DIAGNOSIS — E875 Hyperkalemia: Secondary | ICD-10-CM | POA: Diagnosis not present

## 2023-04-25 MED ORDER — PANTOPRAZOLE SODIUM 40 MG PO TBEC: 40.0000 mg | DELAYED_RELEASE_TABLET | Freq: Every day | ORAL | 0 refills | Status: DC

## 2023-04-25 NOTE — Progress Notes (Signed)
[name] - 72 y.o. male MRN [id number]  Date of birth: Oct 13, 1950  Subjective Chief Complaint  Patient presents with   Hospitalization Follow-up    HPI [name] is a 72 y.o. male here today for follow up of recent hospital visit.  Seen in ED for lower abdominal pain.  Had not had BM in 12 days prior to being seen in the ED.  He had CAT scan that did not show obstruction but did show large stool burden.  He had manual disimpaction previously along with enema.  Repeated at ED visit again with moderate-large bowel movement.  Labs reassuring with mild elevation in potassium and creatinine.  He reports that today he is feeling much better.  He is using stool softener and miralax.   He is having increased reflux symptoms.  He has been prescribed pantoprazole previously but is not sure if he is taking this at this point.  Denies nausea or vomiting.  No dark stools.  ROS:  A comprehensive ROS was completed and negative except as noted per HPI   Allergies  Allergen Reactions   Semaglutide Diarrhea and Rash    Diarrhea   Benazepril Hives and Cough   Gabapentin Swelling   Hydrocodone Hives and Itching   Shellfish Allergy Diarrhea and Nausea And Vomiting    Past Medical History:  Diagnosis Date   Atopic dermatitis 12/09/2022   Autoimmune disease (HCC)    Bronchitis    Chronic HFrEF (heart failure with reduced ejection fraction) (HCC)    normalized EF   Chronic hiccups    Chronic kidney disease, stage 3a (HCC)    DM2 (diabetes mellitus, type 2) (HCC) 09/28/2014   DVT (deep venous thrombosis) (HCC)    GERD (gastroesophageal reflux disease)    Hypogonadism in male 04/01/2018   Hypothyroidism    Joint pain    Necrotizing myopathy    Nonobstructive atherosclerosis of coronary artery    Posttraumatic stress disorder 01/30/2022   PTSD (post-traumatic stress disorder)    Pulmonary embolism (HCC)    Rheumatoid arthritis (HCC) 08/09/2021   RSV (respiratory syncytial virus pneumonia)  03/11/2022    Past Surgical History:  Procedure Laterality Date   BIOPSY SHOULDER     Left   IR ANGIOGRAM PULMONARY BILATERAL SELECTIVE  07/22/2021   IR THROMBECT VENO MECH MOD SED  07/22/2021   IR US GUIDE VASC ACCESS LEFT  07/22/2021   RADIOLOGY WITH ANESTHESIA Right 07/22/2021   Procedure: IR WITH ANESTHESIA;  Surgeon: Radiologist, Medication, MD;  Location: [facility] OR;  Service: Radiology;  Laterality: Right;   UPPER LEG SOFT TISSUE BIOPSY Right     Social History   Socioeconomic History   Marital status: Married    Spouse name: Kim   Number of children: 4   Years of education: 16   Highest education level: Master's degree (e.g., MA, MS, MEng, MEd, MSW, MBA)  Occupational History   Occupation: Retired  Tobacco Use   Smoking status: Never   Smokeless tobacco: Never  Vaping Use   Vaping status: Never Used  Substance and Sexual Activity   Alcohol use: No   Drug use: No   Sexual activity: Not on file  Other Topics Concern   Not on file  Social History Narrative   Lives at home with his wife. He enjoys playing sodoku, playing cards and reading.   Social Drivers of Health   Financial Resource Strain: Low Risk  (08/14/2022)   Overall Financial Resource Strain (CARDIA)    Difficulty  of Paying Living Expenses: Not hard at all  Food Insecurity: No Food Insecurity (09/13/2022)   Hunger Vital Sign    Worried About Running Out of Food in the Last Year: Never true    Ran Out of Food in the Last Year: Never true  Transportation Needs: No Transportation Needs (09/13/2022)   PRAPARE - Administrator, Civil Service (Medical): No    Lack of Transportation (Non-Medical): No  Physical Activity: Insufficiently Active (08/14/2022)   Exercise Vital Sign    Days of Exercise per Week: 3 days    Minutes of Exercise per Session: 20 min  Stress: No Stress Concern Present (08/14/2022)   [employer] of Occupational Health - Occupational Stress Questionnaire    Feeling of Stress :  Not at all  Social Connections: Socially Isolated (08/14/2022)   Social Connection and Isolation Panel [NHANES]    Frequency of Communication with Friends and Family: Once a week    Frequency of Social Gatherings with Friends and Family: Never    Attends Religious Services: Never    Database administrator or Organizations: No    Attends Engineer, structural: Never    Marital Status: Married    Family History  Problem Relation Age of Onset   Hypertension Mother    Diabetes Mother    Arthritis Mother    Depression Mother    Heart attack Father    Hypertension Father     Health Maintenance  Topic Date Due   Diabetic kidney evaluation - Urine ACR  Never done   Zoster Vaccines- Shingrix (1 of 2) Never done   DTaP/Tdap/Td (2 - Td or Tdap) 08/19/2021   INFLUENZA VACCINE  11/29/2022   COVID-19 Vaccine (6 - 2024-25 season) 12/30/2022   HEMOGLOBIN A1C  03/16/2023   Colonoscopy  08/14/2023 (Originally 10/28/1993)   OPHTHALMOLOGY EXAM  06/01/2023   Medicare Annual Wellness (AWV)  08/14/2023   FOOT EXAM  03/12/2024   Diabetic kidney evaluation - eGFR measurement  04/17/2024   Pneumonia Vaccine 9+ Years old  Completed   Hepatitis C Screening  Completed   HPV VACCINES  Aged Out     ----------------------------------------------------------------------------------------------------------------------------------------------------------------------------------------------------------------- Physical Exam BP 133/71 (BP Location: Right Arm, Patient Position: Sitting, Cuff Size: Normal)   Pulse 91   Ht 5\' 8"  (1.727 m)   Wt 233 lb (105.7 kg)   SpO2 100%   BMI 35.43 kg/m   Physical Exam Constitutional:      Appearance: Normal appearance.  HENT:     Head: Normocephalic and atraumatic.  Eyes:     General: No scleral icterus. Cardiovascular:     Rate and Rhythm: Normal rate and regular rhythm.  Pulmonary:     Effort: Pulmonary effort is normal.     Breath sounds: Normal  breath sounds.  Abdominal:     General: There is no distension.     Palpations: Abdomen is soft.     Tenderness: There is no abdominal tenderness.  Musculoskeletal:     Cervical back: Neck supple.  Neurological:     Mental Status: He is alert.  Psychiatric:        Mood and Affect: Mood normal.        Behavior: Behavior normal.     ------------------------------------------------------------------------------------------------------------------------------------------------------------------------------------------------------------------- Assessment and Plan  Constipation Likely related to GLP-1 use.  He is currently holding this.  He will continue MiraLAX and stool softener as needed.  Rechecking potassium and creatinine today.  GERD (gastroesophageal reflux disease) Adding pantoprazole  back on.   Meds ordered this encounter  Medications   pantoprazole (PROTONIX) 40 MG tablet    Sig: Take 1 tablet (40 mg total) by mouth daily.    Dispense:  30 tablet    Refill:  0    No follow-ups on file.    This visit occurred during the SARS-CoV-2 public health emergency.  Safety protocols were in place, including screening questions prior to the visit, additional usage of staff PPE, and extensive cleaning of exam room while observing appropriate contact time as indicated for disinfecting solutions.

## 2023-04-25 NOTE — Patient Instructions (Signed)
Try adding pantoprazole daily for heartburn. Let me know if this is not helpful.

## 2023-04-25 NOTE — Assessment & Plan Note (Signed)
Adding pantoprazole back on.

## 2023-04-25 NOTE — Assessment & Plan Note (Signed)
Likely related to GLP-1 use.  He is currently holding this.  He will continue MiraLAX and stool softener as needed.  Rechecking potassium and creatinine today.

## 2023-04-26 LAB — BASIC METABOLIC PANEL
BUN/Creatinine Ratio: 21 (ref 10–24)
BUN: 28 mg/dL — ABNORMAL HIGH (ref 8–27)
CO2: 23 mmol/L (ref 20–29)
Calcium: 9.9 mg/dL (ref 8.6–10.2)
Chloride: 100 mmol/L (ref 96–106)
Creatinine, Ser: 1.36 mg/dL — ABNORMAL HIGH (ref 0.76–1.27)
Glucose: 198 mg/dL — ABNORMAL HIGH (ref 70–99)
Potassium: 4.6 mmol/L (ref 3.5–5.2)
Sodium: 140 mmol/L (ref 134–144)
eGFR: 55 mL/min/{1.73_m2} — ABNORMAL LOW (ref >59)

## 2023-04-29 ENCOUNTER — Encounter (HOSPITAL_BASED_OUTPATIENT_CLINIC_OR_DEPARTMENT_OTHER): Payer: Self-pay

## 2023-05-01 NOTE — Progress Notes (Deleted)
 Cardiology Office Note:  .   Date:  05/01/2023  ID:  [name], DOB 1950/07/31, MRN [id number] PCP: [name] Bring, DO  [facility] Providers Cardiologist:  Vinie JAYSON Maxcy, MD    History of Present Illness: .   [name] is a 73 y.o. male with a hx of hypertension, DM2, nonobstructive CAD, autoimmune myositis, PTSD, PE, DVT, HFrEF, CKD 3.   He was admitted 07/21/2021 and acute PE as well as acute RLE DVT with acute hypoxic respiratory failure. Testosterone  injections were discontinued. Recommended for indefinite OAC.  Echo  07/23/2021 with newly reduced LVEF 35-40%. Due to AKI, recommended for outpatient ischemic evaluation.    Seen 08/14/2021 Imdur  discontinued due to headache.  Cardiac CTA 08/28/2021 coronary calcium  score of 129 placing him in the 64th percentile. LM 1-24% stenosis and ostial/prox LAD mild 25-49% stenosis (FFR 0.88 prox and 0.82 mid - no significant stenosis). 09/2021 Spironolactone  & Entresto  added for GDMT. Repeat echo 12/20/21 LVEF normalized 55-60%, gr1DD.   Sleep study 02/20/2022 with moderate OSA, CPAP initiated.   Admitted 02/2022 with AMS due to PNA & heart failure exacerbation.  Discharged on torsemide .  Entresto  discontinued and Carvedilol  reduced due to hypotension.  Echo 02/2022 with stable LVEF 55 to 60%, grade 1 diastolic dysfunction, mild dilation aortic root 41 mm.     Readmitted  late 02/2022 with acute metabolic encephalopathy with hypoglycemia, dehydration. Spironolactone , torsemide  were discontinued. Torsemide  later resumed due to edema.    Admitted 08/2022 with LOC seizure vs syncope. CT/MRI brain/EEG unremarkable.  Workup did reveal various degrees of foraminal stenosis recommended for outpatient neurosurgery evaluation. Echo 09/13/22 stable LVEF 60-65.  Last seen 02/22/23 with persistent LE edema followed by lymphedema clinic. BNP was normal. Atorvastatin  was resumed as LDL not at goal <70.    He presents today for follow-up with his wife. His  57 year old son is studying at [employer] with goal of being an MD. Contacted the office noting chest pain/burning. ***  Previous antihypertensive  Imdur  - headache  ROS: Please see the history of present illness.    All other systems reviewed and are negative.   Studies Reviewed: .        ***   Risk Assessment/Calculations:     No BP recorded.  {Refresh Note OR Click here to enter BP  :1}***   STOP-Bang Score:     { Consider Dx Sleep Disordered Breathing or Sleep Apnea  ICD G47.33          :1}    Physical Exam:   VS:  There were no vitals taken for this visit.   Wt Readings from Last 3 Encounters:  04/25/23 233 lb (105.7 kg)  04/18/23 233 lb (105.7 kg)  03/13/23 233 lb (105.7 kg)    GEN: Well nourished, overweight,  well developed in no acute distress NECK: No JVD; No carotid bruits CARDIAC: RRR, no murmurs, rubs, gallops RESPIRATORY:  Clear to auscultation without rales, wheezing or rhonchi  ABDOMEN: Soft, non-tender, non-distended EXTREMITIES:  Bilateral LE with 1+ pitting edema ; No deformity   ASSESSMENT AND PLAN: .    Lymphedema-following with lymphedema clinic.  Wearing lymphedema pumps.    PE / Hx of DVT - In setting of testosterone  injections, admission 06/2021 with acute saddle PE with right heart strain and right popliteal s/p thrombectomy 07/22/21.  LVEF has since recovered.  Continue Eliquis  5 mg twice daily.  Will require lifelong anticoagulation.  Does not meet dose reduction criteria.  Denies bleeding complications.  HTN -Now with tendency towards hypotension. No lightheadedness, dizziness. Continue Torsemide  20mg  daily, Metoprolol  12.5mg  daily. Discussed to monitor BP at home at least 2 hours after medications and sitting for 5-10 minutes.    Nonobstructive CAD - Cardiac CTA 08/28/21 moderate nonobstructive disease. No anginal symptoms.  Transition carvedilol  to metoprolol  succinate 12.5 mg nightly due to hypotension.  No ASA due to OAC. Heart healthy diet and regular  cardiovascular exercise encouraged.  Unclear why not on statin, update direct LDL. If not at goal <70, plan to resume Atorvastatin  likely at lower dose 40mg  daily.    RXI6j - Careful titration of diuretic and antihypertensive.    OSA - CPAP compliance encouraged.    Combined heart failure with recovered LVEF - LVEF 35-40% in setting of PE. 11/2021 and 02/2022 repeat echo with LVEF returned to normal 55-60%. GDMT includes Metoprolol , Torsemide . Entresto , Spironolactone  previously stopped due to hypotension and as LVEF recovered. Low sodium diet, fluid restriction <2L, and daily weights encouraged. Educated to contact our office for weight gain of 2 lbs overnight or 5 lbs in one week. Per wound clinic request, update CMP/BNP due to persistent volume difficulties. ***   GERD - Continue to follow with PCP.  Continue Protonix .     Hypothyroidism / DM2 - Continue to follow with PCP.           Dispo: follow up in ***  Signed, Reche GORMAN Finder, NP

## 2023-05-02 ENCOUNTER — Ambulatory Visit (HOSPITAL_BASED_OUTPATIENT_CLINIC_OR_DEPARTMENT_OTHER): Payer: No Typology Code available for payment source | Admitting: Family

## 2023-05-10 ENCOUNTER — Ambulatory Visit (HOSPITAL_BASED_OUTPATIENT_CLINIC_OR_DEPARTMENT_OTHER): Payer: No Typology Code available for payment source | Admitting: Family

## 2023-05-13 DIAGNOSIS — H401134 Primary open-angle glaucoma, bilateral, indeterminate stage: Secondary | ICD-10-CM | POA: Diagnosis not present

## 2023-05-17 ENCOUNTER — Other Ambulatory Visit: Payer: Self-pay | Admitting: Family Medicine

## 2023-06-25 ENCOUNTER — Encounter (HOSPITAL_BASED_OUTPATIENT_CLINIC_OR_DEPARTMENT_OTHER): Payer: Self-pay | Admitting: Family

## 2023-06-25 ENCOUNTER — Ambulatory Visit (HOSPITAL_BASED_OUTPATIENT_CLINIC_OR_DEPARTMENT_OTHER): Payer: Medicare Other | Admitting: Family

## 2023-06-25 VITALS — BP 128/68 | HR 94 | Ht 68.0 in | Wt 229.8 lb

## 2023-06-25 DIAGNOSIS — I10 Essential (primary) hypertension: Secondary | ICD-10-CM

## 2023-06-25 DIAGNOSIS — G7249 Other inflammatory and immune myopathies, not elsewhere classified: Secondary | ICD-10-CM | POA: Diagnosis not present

## 2023-06-25 DIAGNOSIS — E785 Hyperlipidemia, unspecified: Secondary | ICD-10-CM | POA: Diagnosis not present

## 2023-06-25 DIAGNOSIS — I25118 Atherosclerotic heart disease of native coronary artery with other forms of angina pectoris: Secondary | ICD-10-CM | POA: Diagnosis not present

## 2023-06-25 MED ORDER — REPATHA SURECLICK 140 MG/ML ~~LOC~~ SOAJ: 140.0000 mg | SUBCUTANEOUS | Status: DC

## 2023-06-25 MED ORDER — REPATHA SURECLICK 140 MG/ML ~~LOC~~ SOAJ: 140.0000 mg | SUBCUTANEOUS | 6 refills | Status: DC

## 2023-06-25 NOTE — Progress Notes (Signed)
 . Cardiology Office Note:  .   Date:  06/25/2023  ID:  [name], DOB Jun 04, 1950, MRN [id number] PCP: Everrett Coombe, DO  [facility] Providers Cardiologist:  Chrystie Nose, MD    History of Present Illness: .   [name] is a 73 y.o. male with a hx of hypertension, DM2, nonobstructive CAD, autoimmune myositis, PTSD, PE, DVT, HFrEF, CKD 3.   He was admitted 07/21/2021 and acute PE as well as acute RLE DVT with acute hypoxic respiratory failure in the setting of testosterone injections were discontinued.  Discharged on Eliquis.  Recommended for indefinite OAC.  Echo  07/23/2021 with LVEF 35-40%, wall motion abnormalities noted, grade 1 diastolic dysfunction, trivial MR, mild AI, mild dilation ascending aorta 41 mm.  Due to AKI he was recommended for outpatient ischemic evaluation.  Cardiac CTA 08/28/2021 coronary calcium score of 129 placing him in the 64th percentile for age, gender. LM 1-24% stenosis and ostial/prox LAD mild 25-49% stenosis (FFR 0.88 prox and 0.82 mid - no significant stenosis).   Repeat echo 12/20/21 LVEF 55-60%, stable wall motion abnormalities, gr1DD.   Underwent sleep study 02/20/2022 with moderate OSA recommended for CPAP titration.   Echo 02/2022 admission for PNA and HF exacerbation with LVEF 55 to 60%, grade 1 diastolic dysfunction, aortic valve sclerosis without stenosis, mild dilation aortic root 41 mm.     Readmitted 11/20 - 03/24/2022 with acute metabolic encephalopathy with hypoglycemia, dehydrationSpironolactone, torsemide were discontinued. At PCP visit 03/29/22 due to pitting edema Torsemide resumed.    Admitted 5/15-5/18/24 with LOC seizure vs syncope. CT/MRI brain unremarkable. EEG no seizures. Workup did reveal various degrees of foraminal stenosis recommended for outpatient neurosurgery evaluation. Echo 09/13/22 LVEF 60-65%, no RWMA, mild LVH, no significant valvular abnomalities.    He presents today for follow-up with his wife. His 55 year old son is  a studying at [employer] with goal of being an MD. Follows with [facility] for myopathy, they and his rheumatology team recommended against statin. Presents today to discuss. No chest pain. LE edema improved with lymphedema pumps. Reviewed Repatha in detail. Occasional episodes of indigestion, no exertional chest pain. Stable mild exertional dyspnea.   ROS: Please see the history of present illness.    All other systems reviewed and are negative.   Studies Reviewed: .           Risk Assessment/Calculations:         STOP-Bang Score:         Physical Exam:   VS:  BP 128/68   Pulse 94   Ht 5\' 8"  (1.727 m)   Wt 229 lb 12.8 oz (104.2 kg)   SpO2 97%   BMI 34.94 kg/m    Wt Readings from Last 3 Encounters:  06/25/23 229 lb 12.8 oz (104.2 kg)  04/25/23 233 lb (105.7 kg)  04/18/23 233 lb (105.7 kg)    GEN: Well nourished, overweight,  well developed in no acute distress NECK: No JVD; No carotid bruits CARDIAC: RRR, no murmurs, rubs, gallops RESPIRATORY:  Clear to auscultation without rales, wheezing or rhonchi  ABDOMEN: Soft, non-tender, non-distended EXTREMITIES:  Bilateral LE with 1+ pitting edema ; No deformity   ASSESSMENT AND PLAN: .    Lymphedema-following with lymphedema clinic.  Wearing lymphedema pumps.    PE / Hx of DVT - In setting of testosterone injections, admission 06/2021 with acute saddle PE with right heart strain and right popliteal s/p thrombectomy 07/22/21.  LVEF has since recovered.  Continue Eliquis  5 mg twice daily.  Will require lifelong anticoagulation.  Does not meet dose reduction criteria.  Denies bleeding complications.     HTN - BP well controlled. Continue current antihypertensive regimen Toprol 12.5mg  daily, Torsemide 20mg  daily. Discussed to monitor BP at home at least 2 hours after medications and sitting for 5-10 minutes.    Nonobstructive CAD / HLD, LDL goal <70 - Cardiac CTA 08/28/21 moderate nonobstructive disease. No anginal symptoms.    No  ASA due to [facility].  Due to myopathy per rheumatology cannot be on statin. Rx Repatha 140mg  q14 days. Sample provided FLP/LFT after 6 doses of Repatha.  Given information for [facility]. Heart healthy diet and regular cardiovascular exercise encouraged.     ZOX0R - Careful titration of diuretic and antihypertensive.  06/24/2023 creatinine 1.36, GFR 55   OSA - CPAP compliance encouraged.    Combined heart failure with recovered LVEF - LVEF 35-40% in setting of PE. 11/2021 and 02/2022 repeat echo with LVEF returned to normal 55-60%. GDMT includes Metoprolol, Torsemide. Entresto, Spironolactone previously stopped due to hypotension and as LVEF recovered. Low sodium diet, fluid restriction <2L, and daily weights encouraged. Educated to contact our office for weight gain of 2 lbs overnight or 5 lbs in one week. Per wound clinic request, update CMP/BNP due to persistent volume difficulties.   GERD / Hypothyroidism / DM2 - Per PCP.        Dispo: follow up in 3 mos  Signed, Alver Sorrow, NP

## 2023-06-25 NOTE — Patient Instructions (Addendum)
 Medication Instructions:   [name] Shields, NP recommends Repatha 140mg /ml (PCSK9). This is an injectable cholesterol medication self-administered once every 14 days. This medication will likely need prior approval with your insurance company, which we will work on. If the medication is not approved initially, we may need to do an appeal with your insurance.   Administer medication in area of fatty tissue such as abdomen, outer thigh, back of upper arm - and rotate site with each injection Store medication in refrigerator until ready to administer - allow to sit at room temp for 30 mins - 1 hour prior to injection Dispose of medication in a SHARPS container - your pharmacy should be able to direct you on this and proper disposal   Patient Assistance:    These foundations have funds at various times.   The [employer]: https://www.panfoundation.org/disease-funds/hypercholesterolemia/ -- can sign up for wait list  [facility] offers assistance to help pay for medication copays.  They will cover copays for all cholesterol lowering meds, including statins, fibrates, omega-3 fish oils like Vascepa, ezetimibe, Repatha, Praluent, Nexletol, Nexlizet.  The cards are usually good for $2,500 or 12 months, whichever comes first. Our fax # is [phone] (you will need this to apply) Go to healthwellfoundation.org Click on "Apply Now" Answer questions as to whom is applying (patient or representative) Your disease fund will be "hypercholesterolemia - Medicare access" They will ask questions about finances and which medications you are taking for cholesterol When you submit, the approval is usually within minutes.  You will need to print the card information from the site You will need to show this information to your pharmacy, they will bill your Medicare Part D plan first -then bill [employer] --for the copay.   You can also call them at [phone], although the hold times can be quite  long.      *If you need a refill on your cardiac medications before your next appointment, please call your pharmacy*   Lab Work: Your physician recommends that you return for lab work after 6 doses of Repatha for FLP, LFT  If you have labs (blood work) drawn today and your tests are completely normal, you will receive your results only by: MyChart Message (if you have MyChart) OR A paper copy in the mail If you have any lab test that is abnormal or we need to change your treatment, we will call you to review the results.  Follow-Up: At [facility], you and your health needs are our priority.  As part of our continuing mission to provide you with exceptional heart care, we have created designated Provider Care Teams.  These Care Teams include your primary Cardiologist (physician) and Advanced Practice Providers (APPs -  Physician Assistants and Nurse Practitioners) who all work together to provide you with the care you need, when you need it.  We recommend signing up for the patient portal called "MyChart".  Sign up information is provided on this After Visit Summary.  MyChart is used to connect with patients for Virtual Visits (Telemedicine).  Patients are able to view lab/test results, encounter notes, upcoming appointments, etc.  Non-urgent messages can be sent to your provider as well.   To learn more about what you can do with MyChart, go to [email or web address].    Your next appointment:   3 month(s)  Provider:   K. Italy Hilty, MD or [name] Shields, NP

## 2023-07-01 ENCOUNTER — Other Ambulatory Visit (HOSPITAL_COMMUNITY): Payer: Self-pay

## 2023-07-01 ENCOUNTER — Telehealth: Payer: Self-pay | Admitting: Pharmacy Technician

## 2023-07-01 NOTE — Telephone Encounter (Signed)
-----   Message from Alver Sorrow sent at 06/29/2023  5:30 PM EST ----- Hi all! Wanted to be sure this PA was on your radar. Thanks!

## 2023-07-01 NOTE — Telephone Encounter (Signed)
 Pharmacy Patient Advocate Encounter  Received notification from [employer] that Prior Authorization for Repatha has been APPROVED from 05/01/23 to 06/30/24. Unable to obtain price due to refill too soon rejection, last fill date 06/25/23 next available fill date03/18/25   PA #/Case ID/Reference #: [id number]

## 2023-07-01 NOTE — Telephone Encounter (Signed)
 Pharmacy Patient Advocate Encounter   Received notification from Physician's Office that prior authorization for Repatha is required/requested.   Insurance verification completed.   The patient is insured through [employer] .   Per test claim: PA required; PA submitted to above mentioned insurance via CoverMyMeds Key/confirmation #/EOC [id number] Status is pending

## 2023-07-24 DIAGNOSIS — H25812 Combined forms of age-related cataract, left eye: Secondary | ICD-10-CM | POA: Diagnosis not present

## 2023-07-24 DIAGNOSIS — H2512 Age-related nuclear cataract, left eye: Secondary | ICD-10-CM | POA: Diagnosis not present

## 2023-07-26 ENCOUNTER — Encounter (HOSPITAL_BASED_OUTPATIENT_CLINIC_OR_DEPARTMENT_OTHER): Payer: Self-pay

## 2023-07-26 ENCOUNTER — Ambulatory Visit (HOSPITAL_BASED_OUTPATIENT_CLINIC_OR_DEPARTMENT_OTHER): Payer: No Typology Code available for payment source | Admitting: Family

## 2023-08-01 DIAGNOSIS — H2511 Age-related nuclear cataract, right eye: Secondary | ICD-10-CM | POA: Diagnosis not present

## 2023-08-06 DIAGNOSIS — G729 Myopathy, unspecified: Secondary | ICD-10-CM | POA: Diagnosis not present

## 2023-08-14 DIAGNOSIS — H25811 Combined forms of age-related cataract, right eye: Secondary | ICD-10-CM | POA: Diagnosis not present

## 2023-08-14 DIAGNOSIS — H2511 Age-related nuclear cataract, right eye: Secondary | ICD-10-CM | POA: Diagnosis not present

## 2023-08-20 ENCOUNTER — Emergency Department (HOSPITAL_BASED_OUTPATIENT_CLINIC_OR_DEPARTMENT_OTHER)
Admission: EM | Admit: 2023-08-20 | Discharge: 2023-08-20 | Disposition: A | Discharge status: Home / Self Care | Attending: Emergency Medicine | Admitting: Emergency Medicine

## 2023-08-20 ENCOUNTER — Other Ambulatory Visit: Payer: Self-pay

## 2023-08-20 ENCOUNTER — Emergency Department (HOSPITAL_BASED_OUTPATIENT_CLINIC_OR_DEPARTMENT_OTHER)

## 2023-08-20 ENCOUNTER — Encounter (HOSPITAL_BASED_OUTPATIENT_CLINIC_OR_DEPARTMENT_OTHER): Payer: Self-pay | Admitting: Emergency Medicine

## 2023-08-20 DIAGNOSIS — N1831 Chronic kidney disease, stage 3a: Secondary | ICD-10-CM | POA: Diagnosis not present

## 2023-08-20 DIAGNOSIS — Z7901 Long term (current) use of anticoagulants: Secondary | ICD-10-CM | POA: Insufficient documentation

## 2023-08-20 DIAGNOSIS — E1122 Type 2 diabetes mellitus with diabetic chronic kidney disease: Secondary | ICD-10-CM | POA: Diagnosis not present

## 2023-08-20 DIAGNOSIS — Z7984 Long term (current) use of oral hypoglycemic drugs: Secondary | ICD-10-CM | POA: Insufficient documentation

## 2023-08-20 DIAGNOSIS — I509 Heart failure, unspecified: Secondary | ICD-10-CM | POA: Diagnosis not present

## 2023-08-20 DIAGNOSIS — Z794 Long term (current) use of insulin: Secondary | ICD-10-CM | POA: Diagnosis not present

## 2023-08-20 DIAGNOSIS — K5900 Constipation, unspecified: Secondary | ICD-10-CM | POA: Diagnosis not present

## 2023-08-20 DIAGNOSIS — E039 Hypothyroidism, unspecified: Secondary | ICD-10-CM | POA: Diagnosis not present

## 2023-08-20 LAB — BASIC METABOLIC PANEL WITH GFR
Anion gap: 10 (ref 5–15)
BUN: 29 mg/dL — ABNORMAL HIGH (ref 8–23)
CO2: 28 mmol/L (ref 22–32)
Calcium: 10.4 mg/dL — ABNORMAL HIGH (ref 8.9–10.3)
Chloride: 102 mmol/L (ref 98–111)
Creatinine, Ser: 1.35 mg/dL — ABNORMAL HIGH (ref 0.61–1.24)
GFR, Estimated: 55 mL/min — ABNORMAL LOW (ref >60)
Glucose, Bld: 263 mg/dL — ABNORMAL HIGH (ref 70–99)
Potassium: 5 mmol/L (ref 3.5–5.1)
Sodium: 140 mmol/L (ref 135–145)

## 2023-08-20 LAB — CBC WITH DIFFERENTIAL/PLATELET
Abs Immature Granulocytes: 0.03 10*3/uL (ref 0.00–0.07)
Basophils Absolute: 0 10*3/uL (ref 0.0–0.1)
Basophils Relative: 1 %
Eosinophils Absolute: 0.1 10*3/uL (ref 0.0–0.5)
Eosinophils Relative: 1 %
HCT: 34.2 % — ABNORMAL LOW (ref 39.0–52.0)
Hemoglobin: 11.4 g/dL — ABNORMAL LOW (ref 13.0–17.0)
Immature Granulocytes: 0 %
Lymphocytes Relative: 23 %
Lymphs Abs: 1.9 10*3/uL (ref 0.7–4.0)
MCH: 30.7 pg (ref 26.0–34.0)
MCHC: 33.3 g/dL (ref 30.0–36.0)
MCV: 92.2 fL (ref 80.0–100.0)
Monocytes Absolute: 0.3 10*3/uL (ref 0.1–1.0)
Monocytes Relative: 4 %
Neutro Abs: 5.9 10*3/uL (ref 1.7–7.7)
Neutrophils Relative %: 71 %
Platelets: 231 10*3/uL (ref 150–400)
RBC: 3.71 MIL/uL — ABNORMAL LOW (ref 4.22–5.81)
RDW: 13.7 % (ref 11.5–15.5)
WBC: 8.4 10*3/uL (ref 4.0–10.5)
nRBC: 0 % (ref 0.0–0.2)

## 2023-08-20 LAB — URINALYSIS, W/ REFLEX TO CULTURE (INFECTION SUSPECTED)
Bilirubin Urine: NEGATIVE
Glucose, UA: 250 mg/dL — AB
Hgb urine dipstick: NEGATIVE
Ketones, ur: NEGATIVE mg/dL
Leukocytes,Ua: NEGATIVE
Nitrite: NEGATIVE
Protein, ur: NEGATIVE mg/dL
RBC / HPF: NONE SEEN RBC/hpf (ref 0–5)
Specific Gravity, Urine: 1.015 (ref 1.005–1.030)
WBC, UA: NONE SEEN WBC/hpf (ref 0–5)
pH: 7 (ref 5.0–8.0)

## 2023-08-20 MED ORDER — MAGNESIUM CITRATE PO SOLN: 1.0000 | Freq: Once | ORAL | Status: DC

## 2023-08-20 MED ORDER — BISACODYL 10 MG RE SUPP: 10.0000 mg | RECTAL | 0 refills | Status: AC | PRN

## 2023-08-20 MED ORDER — POLYETHYLENE GLYCOL 3350 17 GM/SCOOP PO POWD: 17.0000 g | Freq: Three times a day (TID) | ORAL | 0 refills | Status: AC

## 2023-08-20 MED ORDER — FLEET ENEMA RE ENEM
1.0000 | ENEMA | Freq: Once | RECTAL | Status: AC
  Administered 2023-08-20: 1 via RECTAL
  Filled 2023-08-20: qty 1

## 2023-08-20 MED ORDER — LACTULOSE 10 GM/15ML PO SOLN: 10.0000 g | Freq: Three times a day (TID) | ORAL | 0 refills | Status: AC

## 2023-08-20 NOTE — ED Provider Notes (Signed)
 [place] [facility] AT [facility] Provider Note  CSN: [id number] Arrival date & time: 08/20/23 1031  Chief Complaint(s) Constipation  HPI [name] is a 73 y.o. male history of CHF, CKD, diabetes, presenting to the emergency department constipation.  Patient reports has had 1 week of constipation with no bowel movement.  No nausea or vomiting.  Has some abdominal pain which is mild, mainly cramping when trying to go to the bathroom.  No fevers or chills.  Also reports some rectal pain.  Has had this previously and feels the same.  Has been taking MiraLAX  and lactulose  at home without much improvement.   Past Medical History Past Medical History:  Diagnosis Date   Atopic dermatitis 12/09/2022   Autoimmune disease (HCC)    Bronchitis    Chronic HFrEF (heart failure with reduced ejection fraction) (HCC)    normalized EF   Chronic hiccups    Chronic kidney disease, stage 3a (HCC)    DM2 (diabetes mellitus, type 2) (HCC) 09/28/2014   DVT (deep venous thrombosis) (HCC)    GERD (gastroesophageal reflux disease)    Hypogonadism in male 04/01/2018   Hypothyroidism    Joint pain    Necrotizing myopathy    Nonobstructive atherosclerosis of coronary artery    Posttraumatic stress disorder 01/30/2022   PTSD (post-traumatic stress disorder)    Pulmonary embolism (HCC)    Rheumatoid arthritis (HCC) 08/09/2021   RSV (respiratory syncytial virus pneumonia) 03/11/2022   Patient Active Problem List   Diagnosis Date Noted   Constipation 04/25/2023   Dyspnea 03/17/2023   Posterior vitreous detachment of both eyes 03/14/2023   Overactive bladder 12/09/2022   Atopic dermatitis 12/09/2022   Long term (current) use of anticoagulants 12/05/2022   Lymphedema 09/13/2022   Venous stasis ulcer without varicose veins (HCC) 09/09/2022   Anemia 09/09/2022   Oropharyngeal dysphagia 04/23/2022   Leg swelling 04/23/2022   Confusion 04/23/2022   Type 2 diabetes mellitus with  other diabetic ophthalmic complication (HCC) 04/16/2022   Shoulder pain 03/22/2022   Hypoglycemia 03/20/2022   Frequent falls 03/19/2022   Chronic combined systolic and diastolic CHF (congestive heart failure) (HCC) 03/11/2022   CKD (chronic kidney disease), stage III (HCC) 03/11/2022   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   History of DVT (deep vein thrombosis) 03/11/2022   History of pulmonary embolism 03/11/2022   Hyperlipidemia 03/11/2022   Obesity (BMI 30-39.9) 03/11/2022   CAD (coronary artery disease) 01/30/2022   Depression 01/30/2022   Cardiomyopathy (HCC) 01/30/2022   Posttraumatic stress disorder 01/30/2022   Rheumatoid arthritis (HCC) 08/09/2021   New onset of headaches after age 84 08/09/2021   Non-ST elevation (NSTEMI) myocardial infarction (HCC)    Acute pulmonary embolism (HCC) 07/22/2021   Acute DVT (deep venous thrombosis) (HCC) 07/22/2021   Elevated troponin 07/22/2021   Anxiety 07/22/2021   Ankle swelling 01/17/2020   GERD (gastroesophageal reflux disease) 06/04/2018   Hypogonadism in male 04/01/2018   Macrocytic anemia 09/30/2017   Itching 09/30/2017   Hypothyroidism 09/30/2017   Low testosterone  09/30/2017   Benign prostatic hyperplasia without lower urinary tract symptoms 09/30/2017   Erectile dysfunction 03/19/2016   Benign essential HTN 08/08/2015   Branch retinal vein occlusion with macular edema of left eye 03/25/2015   Nuclear sclerotic cataract of both eyes 03/25/2015   Myositis 09/28/2014   DM2 (diabetes mellitus, type 2) (HCC) 09/28/2014   Abnormal glucose 10/04/2012   Home Medication(s) Prior to Admission medications   Medication Sig Start Date End Date Taking? Authorizing  Provider  bisacodyl  (DULCOLAX) 10 MG suppository Place 1 suppository (10 mg total) rectally as needed for moderate constipation. 08/20/23  Yes Mordecai Applebaum, MD  lactulose  (CHRONULAC ) 10 GM/15ML solution Take 15 mLs (10 g total) by mouth 3 (three) times daily. 08/20/23  Yes  Mordecai Applebaum, MD  polyethylene glycol powder (MIRALAX ) 17 GM/SCOOP powder Take 17 g by mouth in the morning, at noon, and at bedtime. Take TID until having regular bowel movement, then QD 08/20/23  Yes Taiana Temkin, Dozier Genre, MD  acetaminophen  (TYLENOL ) 500 MG tablet Take 500-1,000 mg by mouth See admin instructions. Take 1000 mg by mouth in the morning and 500 mg at bedtime    [provider]  AMBULATORY NON FORMULARY MEDICATION Please provide 3 in 1 bedside commode.  Dx:R29.6, M62.81 Patient not taking: Reported on 06/25/2023 04/16/22   Adela Holter, DO  AMBULATORY NON FORMULARY MEDICATION Please provide bilateral thigh high compression stockings: Graded compression of 20-45mmhg Patient not taking: Reported on 06/25/2023 09/05/22   Adela Holter, DO  amitriptyline  (ELAVIL ) 25 MG tablet Take 1 tab po qhs 03/13/23   Adela Holter, DO  apixaban  (ELIQUIS ) 5 MG TABS tablet Take 1 tablet (5 mg total) by mouth 2 (two) times daily. 08/09/22   Clearnce Curia, NP  baclofen  (LIORESAL ) 20 MG tablet TAKE 1 TABLET BY MOUTH TWICE A DAY 01/30/22   Adela Holter, DO  calcium  carbonate (OS-CAL) 600 MG TABS tablet Take 600 mg by mouth daily.    [provider]  Calcium  Carbonate Antacid (TUMS PO) Take 3 tablets by mouth 2 (two) times daily as needed (heartburn).    [provider]  Evolocumab  (REPATHA  SURECLICK) 140 MG/ML SOAJ Inject 140 mg into the skin every 14 (fourteen) days. 06/25/23   Walker, Caitlin S, NP  Evolocumab  (REPATHA  SURECLICK) 140 MG/ML SOAJ Inject 140 mg into the skin every 14 (fourteen) days. 06/25/23   Walker, Caitlin S, NP  folic acid  (FOLVITE ) 1 MG tablet Take 2 mg by mouth daily.    [provider]  insulin  glargine (LANTUS ) 100 UNIT/ML injection Inject 0.38 mLs (38 Units total) into the skin daily. 09/15/22   Audria Leather, MD  insulin  glargine-yfgn (SEMGLEE ) 100 UNIT/ML Pen Inject into the skin. 01/17/23   [provider]  latanoprost  (XALATAN )  0.005 % ophthalmic solution 1 drop at bedtime. 08/17/22   [provider]  Levothyroxine  Sodium 75 MCG CAPS Take 75 mcg by mouth daily.    [provider]  loratadine  (CLARITIN ) 10 MG tablet Take 10 mg by mouth daily. 12/12/21   [provider]  metFORMIN (GLUCOPHAGE-XR) 500 MG 24 hr tablet Take by mouth. 09/25/22   [provider]  methotrexate  (RHEUMATREX) 5 MG tablet Take 3 tablets by mouth once a week. Caution: Chemotherapy. Protect from light.    [provider]  metoprolol  succinate (TOPROL  XL) 25 MG 24 hr tablet Take 0.5 tablets (12.5 mg total) by mouth daily. 11/20/22   Clearnce Curia, NP  mirabegron  ER (MYRBETRIQ ) 50 MG TB24 tablet Take 1 tablet (50 mg total) by mouth daily. 03/13/23   Adela Holter, DO  Multiple Vitamins-Minerals (PRESERVISION AREDS 2) CAPS Take 2 caps po daily 12/05/22   Adela Holter, DO  pantoprazole  (PROTONIX ) 40 MG tablet TAKE 1 TABLET BY MOUTH EVERY DAY 05/17/23   Adela Holter, DO  polyvinyl alcohol  (LIQUIFILM TEARS) 1.4 % ophthalmic solution Place 1 drop into both eyes in the morning and at bedtime.    [provider]  predniSONE  (DELTASONE ) 10 MG tablet Take 10 mg by mouth daily with breakfast. Take with 5 mg tablet to equal 15 mg    [provider]  QUEtiapine  (SEROQUEL ) 50 MG tablet Take by mouth. 09/21/22   [provider]  Semaglutide,0.25 or 0.5MG /DOS, 2 MG/3ML SOPN Inject 0.25 mLs into the skin once a week. Patient not taking: Reported on 06/25/2023 01/17/23   [provider]  sertraline  (ZOLOFT ) 25 MG tablet Take 2 tablets (50 mg total) by mouth daily. Take 50mg  daily. 09/15/22   Audria Leather, MD  tamsulosin (FLOMAX) 0.4 MG CAPS capsule Take by mouth. 09/21/22   [provider]  torsemide  (DEMADEX ) 20 MG tablet TAKE 1 TABLET (20 MG TOTAL) BY MOUTH DAILY. MAY TAKE ADDITIONAL TABLET AS DIRECTED BY LYMPHEDEMA CLINIC OR CARDIOLOGIST. 01/08/23 10/05/23  Walker, Caitlin S, NP   triamcinolone  (KENALOG ) 0.1 % paste Apply topically. 09/21/22   [provider]  triamcinolone  cream (KENALOG ) 0.1 % Apply 1 Application topically 2 (two) times daily. Apply bid as needed for rash 12/05/22   Adela Holter, DO                                                                                                                                    Past Surgical History Past Surgical History:  Procedure Laterality Date   BIOPSY SHOULDER     Left   IR ANGIOGRAM PULMONARY BILATERAL SELECTIVE  07/22/2021   IR THROMBECT VENO MECH MOD SED  07/22/2021   IR US  GUIDE VASC ACCESS LEFT  07/22/2021   RADIOLOGY WITH ANESTHESIA Right 07/22/2021   Procedure: IR WITH ANESTHESIA;  Surgeon: Radiologist, Medication, MD;  Location: [facility] OR;  Service: Radiology;  Laterality: Right;   UPPER LEG SOFT TISSUE BIOPSY Right    Family History Family History  Problem Relation Age of Onset   Hypertension Mother    Diabetes Mother    Arthritis Mother    Depression Mother    Heart attack Father    Hypertension Father     Social History Social History   Tobacco Use   Smoking status: Never   Smokeless tobacco: Never  Vaping Use   Vaping status: Never Used  Substance Use Topics   Alcohol  use: No   Drug use: No   Allergies Semaglutide, Benazepril, Gabapentin , Hydrocodone, and Shellfish allergy  Review of Systems Review of Systems  All other systems reviewed and are negative.   Physical Exam Vital Signs  I have reviewed the triage vital signs BP (!) 168/96   Pulse 95   Temp 98.2 F (36.8 C) (Oral)   Resp 20   Ht 5\' 8"  (1.727 m)   Wt 47.3 kg   SpO2 97%   BMI 15.84 kg/m  Physical Exam Vitals and nursing note reviewed.  Constitutional:      General: He is not in acute distress.    Appearance: Normal appearance.  HENT:  Mouth/Throat:     Mouth: Mucous membranes are moist.  Eyes:     Conjunctiva/sclera: Conjunctivae normal.  Cardiovascular:     Rate and Rhythm: Normal rate  and regular rhythm.  Pulmonary:     Effort: Pulmonary effort is normal. No respiratory distress.     Breath sounds: Normal breath sounds.  Abdominal:     General: Abdomen is flat. There is no distension.     Palpations: Abdomen is soft.     Tenderness: There is no abdominal tenderness. There is no guarding or rebound.  Musculoskeletal:     Right lower leg: No edema.     Left lower leg: No edema.  Skin:    General: Skin is warm and dry.     Capillary Refill: Capillary refill takes less than 2 seconds.  Neurological:     Mental Status: He is alert and oriented to person, place, and time. Mental status is at baseline.  Psychiatric:        Mood and Affect: Mood normal.        Behavior: Behavior normal.     ED Results and Treatments Labs (all labs ordered are listed, but only abnormal results are displayed) Labs Reviewed  URINALYSIS, W/ REFLEX TO CULTURE (INFECTION SUSPECTED) - Abnormal; Notable for the following components:      Result Value   Glucose, UA 250 (*)    Bacteria, UA RARE (*)    All other components within normal limits  BASIC METABOLIC PANEL WITH GFR  CBC WITH DIFFERENTIAL/PLATELET                                                                                                                          Radiology DG Abdomen 1 View Result Date: 08/20/2023 CLINICAL DATA:  Constipation for 1 week. EXAM: ABDOMEN - 1 VIEW COMPARISON:  July 21, 2021. FINDINGS: No abnormal bowel dilatation. Moderate amount of stool seen throughout colon and rectum. No radio-opaque calculi or other significant radiographic abnormality are seen. IMPRESSION: Moderate stool burden.  No abnormal bowel dilatation. Electronically Signed   By: Rosalene Colon M.D.   On: 08/20/2023 12:56    Pertinent labs & imaging results that were available during my care of the patient were reviewed by me and considered in my medical decision making (see MDM for details).  Medications Ordered in ED Medications   sodium phosphate (FLEET) enema 1 enema (1 enema Rectal Given 08/20/23 1524)  Procedures Procedures  (including critical care time)  Medical Decision Making / ED Course   MDM:  73 year old presenting with constipation.  He reports a history of constipation.  Does not take any laxative at baseline.  His abdomen is soft and nontender, low concern for any other acute intra-abdominal process such as diverticulitis, abscess, perforation, obstruction, volvulus.  He did say he had some urinary frequency but urinalysis reassuring.  Suspect possible severe constipation or impaction.  Patient refused rectal exam, has had prior disimpaction and reports that it was very painful he does not want to go through with this today even if given medication.  Discussed with the patient we can try enema x 2, hopefully he will have a bowel movement.  Since he is refusing disimpaction, discussed that we can change some of his home medications and refill his lactulose  and MiraLAX  but if he has any persistent symptoms he may ultimately need to come back and have disimpaction performed.  Clinical Course as of 08/20/23 1527  Tue Aug 20, 2023  1512 Signed out to Dr. Ranelle Buys pending enema, re-assessment.  [WS]    Clinical Course User Index [WS] Mordecai Applebaum, MD     Additional history obtained: -Additional history obtained from spouse -External records from outside source obtained and reviewed including: Chart review including previous notes, labs, imaging, consultation notes including prior notes    Lab Tests: -I ordered, reviewed, and interpreted labs.   The pertinent results include:   Labs Reviewed  URINALYSIS, W/ REFLEX TO CULTURE (INFECTION SUSPECTED) - Abnormal; Notable for the following components:      Result Value   Glucose, UA 250 (*)    Bacteria, UA RARE (*)     All other components within normal limits  BASIC METABOLIC PANEL WITH GFR  CBC WITH DIFFERENTIAL/PLATELET    Notable for trace bacturia without other signs of uti   Imaging Studies ordered: I ordered imaging studies including XR abd  On my interpretation imaging demonstrates constipation I independently visualized and interpreted imaging. I agree with the radiologist interpretation   Medicines ordered and prescription drug management: Meds ordered this encounter  Medications   sodium phosphate (FLEET) enema 1 enema   DISCONTD: magnesium  citrate solution 1 Bottle   polyethylene glycol powder (MIRALAX ) 17 GM/SCOOP powder    Sig: Take 17 g by mouth in the morning, at noon, and at bedtime. Take TID until having regular bowel movement, then QD    Dispense:  510 g    Refill:  0   lactulose  (CHRONULAC ) 10 GM/15ML solution    Sig: Take 15 mLs (10 g total) by mouth 3 (three) times daily.    Dispense:  236 mL    Refill:  0   bisacodyl  (DULCOLAX) 10 MG suppository    Sig: Place 1 suppository (10 mg total) rectally as needed for moderate constipation.    Dispense:  12 suppository    Refill:  0    -I have reviewed the patients home medicines and have made adjustments as needed   Social Determinants of Health:  Diagnosis or treatment significantly limited by social determinants of health: obesity   Reevaluation:  Co morbidities that complicate the patient evaluation  Past Medical History:  Diagnosis Date   Atopic dermatitis 12/09/2022   Autoimmune disease (HCC)    Bronchitis    Chronic HFrEF (heart failure with reduced ejection fraction) (HCC)    normalized EF   Chronic hiccups    Chronic kidney disease, stage 3a (HCC)  DM2 (diabetes mellitus, type 2) (HCC) 09/28/2014   DVT (deep venous thrombosis) (HCC)    GERD (gastroesophageal reflux disease)    Hypogonadism in male 04/01/2018   Hypothyroidism    Joint pain    Necrotizing myopathy    Nonobstructive atherosclerosis of  coronary artery    Posttraumatic stress disorder 01/30/2022   PTSD (post-traumatic stress disorder)    Pulmonary embolism (HCC)    Rheumatoid arthritis (HCC) 08/09/2021   RSV (respiratory syncytial virus pneumonia) 03/11/2022      Dispostion: Disposition decision including need for hospitalization was considered, and patient disposition pending at time of sign out.    Final Clinical Impression(s) / ED Diagnoses Final diagnoses:  Constipation, unspecified constipation type     This chart was dictated using voice recognition software.  Despite best efforts to proofread,  errors can occur which can change the documentation meaning.    Mordecai Applebaum, MD 08/20/23 [phone]

## 2023-08-20 NOTE — ED Triage Notes (Signed)
Constipation x1 week

## 2023-08-20 NOTE — ED Notes (Signed)
 Pt. Has noted large amt of edema in legs bilat. From the thighs to his feet.

## 2023-08-20 NOTE — Discharge Instructions (Addendum)
 You were seen in the emergency room for constipation You were able to move your bowels after Fleet enema We have called in prescriptions for Dulcolax, lactulose  and MiraLAX  Continue using these medications as directed to achieve 1-2 soft bowel movements daily Follow-up with your primary care doctor 1 week for reevaluation Return to the emergency department for severe abdominal pain, worsening constipation or any other concerns

## 2023-08-20 NOTE — ED Notes (Signed)
 Pt. Reports he has not pooped in a week and has abd. Pain.  Pt. Feels pressure in his rectum from not pooping.  Pt. Wife reports this has happened before and he can't do an enema at home due to his immobility.

## 2023-08-20 NOTE — ED Notes (Signed)
 Results noted from enema.

## 2023-08-20 NOTE — ED Provider Notes (Signed)
  Physical Exam  BP (!) 168/96   Pulse 95   Temp 98.2 F (36.8 C) (Oral)   Resp 20   Ht 5\' 8"  (1.727 m)   Wt 47.3 kg   SpO2 97%   BMI 15.84 kg/m   Physical Exam Vitals and nursing note reviewed.  HENT:     Head: Normocephalic and atraumatic.  Eyes:     Pupils: Pupils are equal, round, and reactive to light.  Cardiovascular:     Rate and Rhythm: Normal rate and regular rhythm.  Pulmonary:     Effort: Pulmonary effort is normal.     Breath sounds: Normal breath sounds.  Abdominal:     Palpations: Abdomen is soft.     Tenderness: There is no abdominal tenderness.  Skin:    General: Skin is warm and dry.  Neurological:     Mental Status: He is alert.  Psychiatric:        Mood and Affect: Mood normal.     Procedures  Procedures  ED Course / MDM   Clinical Course as of 08/20/23 1646  Tue Aug 20, 2023  1512 Signed out to Dr. Ranelle Buys pending enema, re-assessment.  [WS]  1645 Patient did have a large bowel movement after Fleet enema and feels much better.  Will discharge with instructions for bowel regimen and PCP follow-up [MP]    Clinical Course User Index [MP] Sallyanne Creamer, DO [WS] Mordecai Applebaum, MD   Medical Decision Making I, Rafael Bun DO, have assumed care of this patient from the previous provider pending reevaluation after Fleet enema and disposition  Amount and/or Complexity of Data Reviewed Labs: ordered. Radiology: ordered.  Risk OTC drugs. Prescription drug management.          Sallyanne Creamer, DO 08/20/23 1646

## 2023-08-21 ENCOUNTER — Telehealth: Payer: Self-pay

## 2023-08-21 NOTE — Transitions of Care (Post Inpatient/ED Visit) (Unsigned)
   08/21/2023  Name: [name] MRN: [id number] DOB: [birth year]  Today's TOC FU Call Status: Today's TOC FU Call Status:: Unsuccessful Call (1st Attempt) Unsuccessful Call (1st Attempt) Date: 08/21/23  Attempted to reach the patient regarding the most recent Inpatient/ED visit.  Follow Up Plan: Additional outreach attempts will be made to reach the patient to complete the Transitions of Care (Post Inpatient/ED visit) call.   Signature Darrall Ellison, LPN [facility] Nurse Health Advisor Direct Dial [phone]

## 2023-08-22 NOTE — Transitions of Care (Post Inpatient/ED Visit) (Unsigned)
   08/22/2023  Name: [name] MRN: [id number] DOB: 19-Apr-1951  Today's TOC FU Call Status: Today's TOC FU Call Status:: Unsuccessful Call (2nd Attempt) Unsuccessful Call (1st Attempt) Date: 08/21/23 Unsuccessful Call (2nd Attempt) Date: 08/22/23  Attempted to reach the patient regarding the most recent Inpatient/ED visit.  Follow Up Plan: Additional outreach attempts will be made to reach the patient to complete the Transitions of Care (Post Inpatient/ED visit) call.   Signature Darrall Ellison, LPN [facility] Nurse Health Advisor Direct Dial [phone]

## 2023-08-23 NOTE — Transitions of Care (Post Inpatient/ED Visit) (Signed)
   08/23/2023  Name: [name] MRN: [id number] DOB: [birth year]  Today's TOC FU Call Status: Today's TOC FU Call Status:: Unsuccessful Call (3rd Attempt) Unsuccessful Call (1st Attempt) Date: 08/21/23 Unsuccessful Call (2nd Attempt) Date: 08/22/23 Unsuccessful Call (3rd Attempt) Date: 08/23/23  Attempted to reach the patient regarding the most recent Inpatient/ED visit.  Follow Up Plan: No further outreach attempts will be made at this time. We have been unable to contact the patient.  Signature Darrall Ellison, LPN [facility] Nurse Health Advisor Direct Dial [phone]

## 2023-10-01 ENCOUNTER — Ambulatory Visit (HOSPITAL_BASED_OUTPATIENT_CLINIC_OR_DEPARTMENT_OTHER): Payer: No Typology Code available for payment source | Admitting: Family

## 2023-10-09 ENCOUNTER — Encounter: Payer: Self-pay | Admitting: Nurse Practitioner

## 2023-10-09 ENCOUNTER — Ambulatory Visit: Attending: Cardiovascular Disease | Admitting: Nurse Practitioner

## 2023-10-09 VITALS — BP 132/78 | HR 88 | Ht 68.0 in | Wt 235.0 lb

## 2023-10-09 DIAGNOSIS — Z86711 Personal history of pulmonary embolism: Secondary | ICD-10-CM | POA: Insufficient documentation

## 2023-10-09 DIAGNOSIS — E1165 Type 2 diabetes mellitus with hyperglycemia: Secondary | ICD-10-CM | POA: Insufficient documentation

## 2023-10-09 DIAGNOSIS — Z794 Long term (current) use of insulin: Secondary | ICD-10-CM | POA: Insufficient documentation

## 2023-10-09 DIAGNOSIS — E039 Hypothyroidism, unspecified: Secondary | ICD-10-CM | POA: Diagnosis not present

## 2023-10-09 DIAGNOSIS — I25118 Atherosclerotic heart disease of native coronary artery with other forms of angina pectoris: Secondary | ICD-10-CM | POA: Insufficient documentation

## 2023-10-09 DIAGNOSIS — I890 Lymphedema, not elsewhere classified: Secondary | ICD-10-CM | POA: Insufficient documentation

## 2023-10-09 DIAGNOSIS — I5032 Chronic diastolic (congestive) heart failure: Secondary | ICD-10-CM | POA: Diagnosis not present

## 2023-10-09 DIAGNOSIS — E785 Hyperlipidemia, unspecified: Secondary | ICD-10-CM | POA: Diagnosis not present

## 2023-10-09 DIAGNOSIS — Z86718 Personal history of other venous thrombosis and embolism: Secondary | ICD-10-CM | POA: Insufficient documentation

## 2023-10-09 DIAGNOSIS — I10 Essential (primary) hypertension: Secondary | ICD-10-CM | POA: Insufficient documentation

## 2023-10-09 DIAGNOSIS — G4733 Obstructive sleep apnea (adult) (pediatric): Secondary | ICD-10-CM | POA: Insufficient documentation

## 2023-10-09 DIAGNOSIS — N1831 Chronic kidney disease, stage 3a: Secondary | ICD-10-CM | POA: Insufficient documentation

## 2023-10-09 MED ORDER — REPATHA SURECLICK 140 MG/ML ~~LOC~~ SOAJ: 140.0000 mg | SUBCUTANEOUS | 6 refills | Status: AC

## 2023-10-09 NOTE — Patient Instructions (Signed)
 Medication Instructions:  Your physician recommends that you continue on your current medications as directed. Please refer to the Current Medication list given to you today.  *If you need a refill on your cardiac medications before your next appointment, please call your pharmacy*  Lab Work: Fasting lipid panel, LFTs  Testing/Procedures: NONE ordered at this time of appointment   Follow-Up: At [facility], you and your health needs are our priority.  As part of our continuing mission to provide you with exceptional heart care, our providers are all part of one team.  This team includes your primary Cardiologist (physician) and Advanced Practice Providers or APPs (Physician Assistants and Nurse Practitioners) who all work together to provide you with the care you need, when you need it.  Your next appointment:   2 month(s)  Provider:   Neomi Banks, NP    We recommend signing up for the patient portal called MyChart.  Sign up information is provided on this After Visit Summary.  MyChart is used to connect with patients for Virtual Visits (Telemedicine).  Patients are able to view lab/test results, encounter notes, upcoming appointments, etc.  Non-urgent messages can be sent to your provider as well.   To learn more about what you can do with MyChart, go to [email or web address].   Other Instructions

## 2023-10-09 NOTE — Progress Notes (Signed)
 Office Visit    Patient Name: [name] Date of Encounter: 10/09/2023  Primary Care Provider:  Adela Holter, DO Primary Cardiologist:  Hazle Lites, MD  Chief Complaint    73 year old male with a history of nonobstructive CAD, heart failure with improved EF, PE/DVT, hypertension, type 2 diabetes, autoimmune myositis, CKD stage III, and PTSD who presents for follow-up related to CAD and heart failure.   Past Medical History    Past Medical History:  Diagnosis Date   Atopic dermatitis 12/09/2022   Autoimmune disease (HCC)    Bronchitis    Chronic HFrEF (heart failure with reduced ejection fraction) (HCC)    normalized EF   Chronic hiccups    Chronic kidney disease, stage 3a (HCC)    DM2 (diabetes mellitus, type 2) (HCC) 09/28/2014   DVT (deep venous thrombosis) (HCC)    GERD (gastroesophageal reflux disease)    Hypogonadism in male 04/01/2018   Hypothyroidism    Joint pain    Necrotizing myopathy    Nonobstructive atherosclerosis of coronary artery    Posttraumatic stress disorder 01/30/2022   PTSD (post-traumatic stress disorder)    Pulmonary embolism (HCC)    Rheumatoid arthritis (HCC) 08/09/2021   RSV (respiratory syncytial virus pneumonia) 03/11/2022   Past Surgical History:  Procedure Laterality Date   BIOPSY SHOULDER     Left   IR ANGIOGRAM PULMONARY BILATERAL SELECTIVE  07/22/2021   IR THROMBECT VENO MECH MOD SED  07/22/2021   IR US  GUIDE VASC ACCESS LEFT  07/22/2021   RADIOLOGY WITH ANESTHESIA Right 07/22/2021   Procedure: IR WITH ANESTHESIA;  Surgeon: Radiologist, Medication, MD;  Location: [facility] OR;  Service: Radiology;  Laterality: Right;   UPPER LEG SOFT TISSUE BIOPSY Right     Allergies  Allergies  Allergen Reactions   Semaglutide Diarrhea and Rash    Diarrhea   Benazepril Hives and Cough   Gabapentin  Swelling   Hydrocodone Hives and Itching   Shellfish Allergy Diarrhea and Nausea And Vomiting     Labs/Other Studies Reviewed    The  following studies were reviewed today:  Cardiac Studies & Procedures   ______________________________________________________________________________________________     ECHOCARDIOGRAM  ECHOCARDIOGRAM COMPLETE 09/13/2022  Narrative ECHOCARDIOGRAM REPORT    Patient Name:   [name] Date of Exam: 09/13/2022 Medical Rec #:  [id number]   Height:       68.0 in Accession #:    [id number]  Weight:       219.8 lb Date of Birth:  01/15/1951    BSA:          2.127 m Patient Age:    71 years    BP:           102/77 mmHg Patient Gender: M           HR:           73 bpm. Exam Location:  Inpatient  Procedure: 2D Echo, Cardiac Doppler, Color Doppler and Intracardiac Opacification Agent  Indications:    CHF- Acute diastolic  History:        Patient has prior history of Echocardiogram examinations, most recent 03/14/2022. CHF, Previous Myocardial Infarction; Risk Factors:Diabetes. Hx of DVT, PE, CKD.  Sonographer:    Jasmine Mesi Referring Phys: [id number] [facility]   Sonographer Comments: Technically difficult study due to poor echo windows. Image acquisition challenging due to patient body habitus and Image acquisition challenging due to respiratory motion. IMPRESSIONS   1. Left ventricular ejection fraction, by estimation, is 60  to 65%. The left ventricle has normal function. The left ventricle has no regional wall motion abnormalities. There is mild concentric left ventricular hypertrophy. Left ventricular diastolic parameters are indeterminate. Elevated left ventricular end-diastolic pressure. 2. Right ventricular systolic function was not well visualized. The right ventricular size is normal. 3. The mitral valve is normal in structure. No evidence of mitral valve regurgitation. No evidence of mitral stenosis. 4. The aortic valve is tricuspid. Aortic valve regurgitation is trivial. Aortic valve sclerosis/calcification is present, without any evidence of aortic stenosis. 5. The  inferior vena cava is normal in size with greater than 50% respiratory variability, suggesting right atrial pressure of 3 mmHg. 6. Ascending aorta measurements are within normal limits for age when indexed to body surface area.  FINDINGS Left Ventricle: Left ventricular ejection fraction, by estimation, is 60 to 65%. The left ventricle has normal function. The left ventricle has no regional wall motion abnormalities. Definity  contrast agent was given IV to delineate the left ventricular endocardial borders. The left ventricular internal cavity size was normal in size. There is mild concentric left ventricular hypertrophy. Left ventricular diastolic parameters are indeterminate. Elevated left ventricular end-diastolic pressure.  Right Ventricle: The right ventricular size is normal. No increase in right ventricular wall thickness. Right ventricular systolic function was not well visualized.  Left Atrium: Left atrial size was normal in size.  Right Atrium: Right atrial size was normal in size.  Pericardium: There is no evidence of pericardial effusion.  Mitral Valve: The mitral valve is normal in structure. No evidence of mitral valve regurgitation. No evidence of mitral valve stenosis. MV peak gradient, 4.8 mmHg. The mean mitral valve gradient is 2.0 mmHg.  Tricuspid Valve: The tricuspid valve is normal in structure. Tricuspid valve regurgitation is trivial. No evidence of tricuspid stenosis.  Aortic Valve: The aortic valve is tricuspid. Aortic valve regurgitation is trivial. Aortic valve sclerosis/calcification is present, without any evidence of aortic stenosis.  Pulmonic Valve: The pulmonic valve was normal in structure. Pulmonic valve regurgitation is mild. No evidence of pulmonic stenosis.  Aorta: The aortic root is normal in size and structure. Ascending aorta measurements are within normal limits for age when indexed to body surface area.  Venous: The inferior vena cava is normal in  size with greater than 50% respiratory variability, suggesting right atrial pressure of 3 mmHg.  IAS/Shunts: No atrial level shunt detected by color flow Doppler.   LEFT VENTRICLE PLAX 2D LVIDd:         4.00 cm     Diastology LVIDs:         3.10 cm     LV e' medial:    4.21 cm/s LV PW:         1.20 cm     LV E/e' medial:  19.7 LV IVS:        1.10 cm     LV e' lateral:   5.70 cm/s LVOT diam:     2.30 cm     LV E/e' lateral: 14.6 LV SV:         94 LV SV Index:   44 LVOT Area:     4.15 cm  LV Volumes (MOD) LV vol d, MOD A2C: 71.2 ml LV vol d, MOD A4C: 76.3 ml LV vol s, MOD A2C: 33.1 ml LV vol s, MOD A4C: 31.6 ml LV SV MOD A2C:     38.1 ml LV SV MOD A4C:     76.3 ml LV SV MOD BP:  40.7 ml  RIGHT VENTRICLE            IVC RV S prime:     9.99 cm/s  IVC diam: 1.60 cm TAPSE (M-mode): 1.4 cm  LEFT ATRIUM             Index        RIGHT ATRIUM           Index LA diam:        3.40 cm 1.60 cm/m   RA Area:     11.80 cm LA Vol (A2C):   49.9 ml 23.46 ml/m  RA Volume:   23.90 ml  11.23 ml/m LA Vol (A4C):   41.5 ml 19.51 ml/m LA Biplane Vol: 46.4 ml 21.81 ml/m AORTIC VALVE LVOT Vmax:   97.30 cm/s LVOT Vmean:  74.900 cm/s LVOT VTI:    0.226 m  AORTA Ao Root diam: 3.80 cm Ao Asc diam:  4.00 cm  MITRAL VALVE MV Area (PHT): 3.53 cm    SHUNTS MV Area VTI:   3.04 cm    Systemic VTI:  0.23 m MV Peak grad:  4.8 mmHg    Systemic Diam: 2.30 cm MV Mean grad:  2.0 mmHg MV Vmax:       1.09 m/s MV Vmean:      61.5 cm/s MV Decel Time: 215 msec MV E velocity: 83.10 cm/s MV A velocity: 90.80 cm/s MV E/A ratio:  0.92  Gaylyn Keas MD Electronically signed by Gaylyn Keas MD Signature Date/Time: 09/13/2022/11:26:16 AM    Final      CT SCANS  CT CORONARY FRACTIONAL FLOW RESERVE DATA PREP 08/28/2021  Narrative EXAM: CT FFR ANALYSIS  FINDINGS: CT FFR analysis was performed on the original cardiac computed tomography angiogram dataset. Diagrammatic representation of the  CT FFR analysis is provided in a separate PDF document in PACS. This dictation was created using the PDF document and an interactive 3D model of the results. The 3D model is not available in the EMR/PACS. Normal CT FFR range is >0.80.  1. Left Main: No significant stenosis.  2. LAD: 0.88 proximal LAD, 0.82 mid LAD. 3. LCX: No significant stenosis. 4. RCA: No significant stenosis.  IMPRESSION: 1. CT FFR analysis didn't show any significant stenosis in the ostial/proximal LAD.  Peder Bourdon, MD   Electronically Signed By: Peder Bourdon M.D. On: 08/30/2021 18:10   CT CORONARY MORPH W/CTA COR W/SCORE 08/28/2021  Addendum 08/28/2021  3:20 PM ADDENDUM REPORT: 08/28/2021 15:17  CLINICAL DATA:  Chest pain  EXAM: Cardiac CTA  MEDICATIONS: Sub lingual nitro. 4mg  x 2  TECHNIQUE: The patient was scanned on a Siemens 192 slice scanner. Gantry rotation speed was 250 msecs. Collimation was 0.6 mm. A 100 kV prospective scan was triggered in the ascending thoracic aorta at 35-75% of the R-R interval. Average HR during the scan was 60 bpm. The 3D data set was interpreted on a dedicated work station using MPR, MIP and VRT modes. A total of 80cc of contrast was used.  FINDINGS: Non-cardiac: See separate report from [facility].  Pulmonary veins drain normally to the left atrium. No LA appendage thrombus noted.  Calcium  Score: 129 Agatston units.  Coronary Arteries: Right dominant with no anomalies  LM: Mixed plaque distal left main, mild (1-24%) stenosis.  LAD system: Mixed plaque ostial/proximal LAD, mild (25-49%) stenosis.  Circumflex system: No plaque or stenosis.  RCA system: No plaque or stenosis.  IMPRESSION: 1. Coronary artery calcium  score 129 Agatston units. This places  the patient in the 64th percentile for age and gender, suggesting intermediate risk for future cardiac events.  2. Suspect mild (25-49%) stenosis in the ostial/proximal LAD. Will send  for FFR to confirm.  Dalton Mclean   Electronically Signed By: Peder Bourdon M.D. On: 08/28/2021 15:17  Narrative EXAM: OVER-READ INTERPRETATION  CT CHEST  The following report is an over-read performed by radiologist Dr. Janeece Mechanic of [facility] on 08/28/2021. This over-read does not include interpretation of cardiac or coronary anatomy or pathology. The coronary CTA interpretation by the cardiologist is attached.  COMPARISON:  07/21/2021  FINDINGS: Vascular: Heart is normal size. Aorta normal caliber. Previously seen saddle embolus no longer visualized. No central pulmonary emboli. The segmental and subsegmental vessels are not well opacified on this coronary study.  Mediastinum/Nodes: No adenopathy  Lungs/Pleura: No confluent opacities or effusions.  Upper Abdomen: No acute findings  Musculoskeletal: Chest wall soft tissues are unremarkable. No acute bony abnormality.  IMPRESSION: No acute or significant extracardiac abnormality.  Previously seen saddle embolus no longer visualized. No central pulmonary embolus seen.  Electronically Signed: By: Janeece Mechanic M.D. On: 08/28/2021 14:33     ______________________________________________________________________________________________     Recent Labs: 02/22/2023: ALT 16; BNP 46.0 08/20/2023: BUN 29; Creatinine, Ser 1.35; Hemoglobin 11.4; Platelets 231; Potassium 5.0; Sodium 140  Recent Lipid Panel    Component Value Date/Time   CHOL 182 07/24/2021 0022   TRIG 132 07/24/2021 0022   HDL 41 07/24/2021 0022   CHOLHDL 4.4 07/24/2021 0022   VLDL 26 07/24/2021 0022   LDLCALC 115 (H) 07/24/2021 0022   LDLCALC 131 (H) 09/22/2019 0858   LDLDIRECT 152 (H) 02/22/2023 1512   LDLDIRECT 72.0 04/01/2018 1035    History of Present Illness    73 year old male with the above past medical history including nonobstructive CAD, heart failure with improved EF, PE/DVT, hypertension, lymphedema, type 2 diabetes,  autoimmune myositis, CKD stage III, hypothyroidism, OSA, GERD, and PTSD.  He was hospitalized in March 2023 in the setting of PE/R LE DVT, acute hypoxic respiratory failure.  He was using testosterone  injections at the time, this was discontinued.  He was discharged on Eliquis  with recommendations for indefinite anticoagulation.  Echocardiogram at the time showed EF 35 to 40%, wall motion abnormalities noted, G1 DD, trivial MR, mild AR, mild dilation ascending aorta measuring 41 mm.  He underwent coronary CT angiogram in 08/2021 revealed coronary calcium  score of 129 (64th percentile), mild nonobstructive CAD.  Repeat echocardiogram in 11/2021 showed EF improved 55 to 60%, stable wall motion abnormalities, G1 DD.  He was hospitalized in May 2024 in the setting of seizure, syncope with loss of consciousness.  CT/MRI of the brain were unremarkable.  EEG was negative for seizure activity.  Echocardiogram in 5/24 showed EF 60 to 65%, no RMA, mild LVH, no significant valvular abnormalities.  Workup did reveal various degrees of foraminal stenosis, outpatient neurosurgery evaluation was recommended. He was last seen in the office on 06/25/2023 and was stable from a cardiac standpoint.  He was started on Repatha .  He presents today for follow-up accompanied by his wife. Since his last visit he has been stable overall from a cardiac standpoint.  He is tolerating Repatha . He has been dealing with constipation.  He stopped taking semaglutide however, his symptoms of constipation have persisted.  Additionally, he was experiencing episodes of epigastric discomfort.  He now attributes his symptoms to acid reflux.  He was using lemon flavored cough drops, he stopped using  these and his symptoms have resolved.  He notes he is scheduled to see GI later this month.  He does have some increased bilateral lower extremity/thigh edema.  He denies any chest pain, palpitations, dizziness, dyspnea, PND, orthopnea, weight gain.  Home  Medications    Current Outpatient Medications  Medication Sig Dispense Refill   acetaminophen  (TYLENOL ) 500 MG tablet Take 500-1,000 mg by mouth See admin instructions. Take 1000 mg by mouth in the morning and 500 mg at bedtime     AMBULATORY NON FORMULARY MEDICATION Please provide 3 in 1 bedside commode.  Dx:R29.6, M62.81 1 Device 0   AMBULATORY NON FORMULARY MEDICATION Please provide bilateral thigh high compression stockings: Graded compression of 20-58mmhg 3 Package 0   amitriptyline  (ELAVIL ) 25 MG tablet Take 1 tab po qhs 90 tablet 1   apixaban  (ELIQUIS ) 5 MG TABS tablet Take 1 tablet (5 mg total) by mouth 2 (two) times daily. 60 tablet 11   baclofen  (LIORESAL ) 20 MG tablet TAKE 1 TABLET BY MOUTH TWICE A DAY 180 tablet 1   bisacodyl  (DULCOLAX) 10 MG suppository Place 1 suppository (10 mg total) rectally as needed for moderate constipation. 12 suppository 0   calcium  carbonate (OS-CAL) 600 MG TABS tablet Take 600 mg by mouth daily.     Calcium  Carbonate Antacid (TUMS PO) Take 3 tablets by mouth 2 (two) times daily as needed (heartburn).     Evolocumab  (REPATHA  SURECLICK) 140 MG/ML SOAJ Inject 140 mg into the skin every 14 (fourteen) days. 2 mL 6   folic acid  (FOLVITE ) 1 MG tablet Take 2 mg by mouth daily.     insulin  glargine (LANTUS ) 100 UNIT/ML injection Inject 0.38 mLs (38 Units total) into the skin daily.     insulin  glargine-yfgn (SEMGLEE ) 100 UNIT/ML Pen Inject into the skin.     lactulose  (CHRONULAC ) 10 GM/15ML solution Take 15 mLs (10 g total) by mouth 3 (three) times daily. 236 mL 0   latanoprost  (XALATAN ) 0.005 % ophthalmic solution 1 drop at bedtime.     Levothyroxine  Sodium 75 MCG CAPS Take 75 mcg by mouth daily.     loratadine  (CLARITIN ) 10 MG tablet Take 10 mg by mouth daily.     metFORMIN (GLUCOPHAGE-XR) 500 MG 24 hr tablet Take by mouth.     methotrexate  (RHEUMATREX) 5 MG tablet Take 3 tablets by mouth once a week. Caution: Chemotherapy. Protect from light.     metoprolol   succinate (TOPROL  XL) 25 MG 24 hr tablet Take 0.5 tablets (12.5 mg total) by mouth daily. 45 tablet 3   mirabegron  ER (MYRBETRIQ ) 50 MG TB24 tablet Take 1 tablet (50 mg total) by mouth daily. 90 tablet 1   Multiple Vitamins-Minerals (PRESERVISION AREDS 2) CAPS Take 2 caps po daily 90 capsule 3   pantoprazole  (PROTONIX ) 40 MG tablet TAKE 1 TABLET BY MOUTH EVERY DAY 90 tablet 1   polyethylene glycol powder (MIRALAX ) 17 GM/SCOOP powder Take 17 g by mouth in the morning, at noon, and at bedtime. Take TID until having regular bowel movement, then QD 510 g 0   polyvinyl alcohol  (LIQUIFILM TEARS) 1.4 % ophthalmic solution Place 1 drop into both eyes in the morning and at bedtime.     predniSONE  (DELTASONE ) 10 MG tablet Take 10 mg by mouth daily with breakfast. Take with 5 mg tablet to equal 15 mg     QUEtiapine  (SEROQUEL ) 50 MG tablet Take by mouth.     Semaglutide,0.25 or 0.5MG /DOS, 2 MG/3ML SOPN Inject 0.25 mLs into  the skin once a week.     sertraline  (ZOLOFT ) 25 MG tablet Take 2 tablets (50 mg total) by mouth daily. Take 50mg  daily.     tamsulosin (FLOMAX) 0.4 MG CAPS capsule Take by mouth.     Evolocumab  (REPATHA  SURECLICK) 140 MG/ML SOAJ Inject 140 mg into the skin every 14 (fourteen) days. (Patient not taking: Reported on 10/09/2023)     torsemide  (DEMADEX ) 20 MG tablet TAKE 1 TABLET (20 MG TOTAL) BY MOUTH DAILY. MAY TAKE ADDITIONAL TABLET AS DIRECTED BY LYMPHEDEMA CLINIC OR CARDIOLOGIST. 135 tablet 1   triamcinolone  (KENALOG ) 0.1 % paste Apply topically. (Patient not taking: Reported on 10/09/2023)     triamcinolone  cream (KENALOG ) 0.1 % Apply 1 Application topically 2 (two) times daily. Apply bid as needed for rash (Patient not taking: Reported on 10/09/2023) 80 g 0   No current facility-administered medications for this visit.     Review of Systems    He denies chest pain, palpitations, dyspnea, pnd, orthopnea, n, v, dizziness, syncope, weight gain, or early satiety. All other systems reviewed  and are otherwise negative except as noted above.   Physical Exam    VS:  BP 132/78   Pulse 88   Ht 5' 8 (1.727 m)   Wt 235 lb (106.6 kg)   SpO2 98%   BMI 35.73 kg/m   STOP-Bang Score:         GEN: Well nourished, well developed, in no acute distress. HEENT: normal. Neck: Supple, no JVD, carotid bruits, or masses. Cardiac: RRR, no murmurs, rubs, or gallops. No clubbing, cyanosis, non-pitting bilateral lower extremity/thigh edema.  Radials/DP/PT 2+ and equal bilaterally.  Respiratory:  Respirations regular and unlabored, clear to auscultation bilaterally. GI: Soft, nontender, nondistended, BS + x 4. MS: no deformity or atrophy. Skin: warm and dry, no rash. Neuro:  Strength and sensation are intact. Psych: Normal affect.  Accessory Clinical Findings    ECG personally reviewed by me today -    - no EKG in office today.   Lab Results  Component Value Date   WBC 8.4 08/20/2023   HGB 11.4 (L) 08/20/2023   HCT 34.2 (L) 08/20/2023   MCV 92.2 08/20/2023   PLT 231 08/20/2023   Lab Results  Component Value Date   CREATININE 1.35 (H) 08/20/2023   BUN 29 (H) 08/20/2023   NA 140 08/20/2023   K 5.0 08/20/2023   CL 102 08/20/2023   CO2 28 08/20/2023   Lab Results  Component Value Date   ALT 16 02/22/2023   AST 18 02/22/2023   ALKPHOS 84 02/22/2023   BILITOT 0.4 02/22/2023   Lab Results  Component Value Date   CHOL 182 07/24/2021   HDL 41 07/24/2021   LDLCALC 115 (H) 07/24/2021   LDLDIRECT 152 (H) 02/22/2023   TRIG 132 07/24/2021   CHOLHDL 4.4 07/24/2021    Lab Results  Component Value Date   HGBA1C 9.9 (H) 09/13/2022    Assessment & Plan   1. Nonobstructive CAD: Coronary CT angiogram in 08/2021 revealed coronary calcium  score of 129 (64th percentile), mild nonobstructive CAD.  Recent epigastric discomfort, which she now attributes to acid reflux.  He denies other symptoms concerning for angina.  He is scheduled to see GI.  Continue to monitor.  Continue metoprolol ,  Repatha .  2. Heart failure with improved EF: Prior EF 35 to 40%.  Repeat echocardiogram in 11/2021 showed EF improved 55 to 60%, stable wall motion abnormalities, G1 DD.  He reports a mild  increase in bilateral lower extremity/thigh edema.  Encouraged him to take an additional torsemide  daily x 3 days to see if his welling improves. Weight has been stable overall, well compensated on exam. Of note, he does have a documented weight of 104 pounds on 08/20/2023.  This was likely entered in error (prior documented weight was 229 LB). Continue torsemide .  He may take an additional torsemide  20 mg daily as needed.  3. History of PE/DVT: On chronic anticoagulation with Eliquis .  4. Hypertension: BP well controlled. Continue current antihypertensive regimen.   5. Hyperlipidemia: LDL was 36 in 08/2023.  Continue Repatha .  Per PCP.  6. Lymphedema: He does use lymphedema pumps (previously followed by lymphedema clinic).  Consider temporary increase in torsemide  as above.  7. Type 2 diabetes: A1c was 9.3 in 08/2023.  Followed by endocrinology.  8. CKD stage III: Creatinine was 1.43 in 08/2023.  Followed by nephrology.  9. Hypothyroidism: TSH was 0.982 in 08/2023.  On levothyroxine .  Followed by endocrinology.  10. OSA: Previously on CPAP with intermittent adherence.  11. Disposition: Follow-up in 2 months with Neomi Banks, sooner if needed.       Jude Norton, NP 10/09/2023, 3:54 PM

## 2023-10-11 ENCOUNTER — Encounter: Payer: Self-pay | Admitting: Nurse Practitioner

## 2023-11-11 ENCOUNTER — Other Ambulatory Visit (HOSPITAL_BASED_OUTPATIENT_CLINIC_OR_DEPARTMENT_OTHER): Payer: Self-pay | Admitting: Family

## 2023-11-11 DIAGNOSIS — R600 Localized edema: Secondary | ICD-10-CM

## 2023-11-11 DIAGNOSIS — I5032 Chronic diastolic (congestive) heart failure: Secondary | ICD-10-CM

## 2023-11-21 DIAGNOSIS — H401134 Primary open-angle glaucoma, bilateral, indeterminate stage: Secondary | ICD-10-CM | POA: Diagnosis not present

## 2023-12-09 ENCOUNTER — Ambulatory Visit (HOSPITAL_BASED_OUTPATIENT_CLINIC_OR_DEPARTMENT_OTHER): Admitting: Family

## 2023-12-11 ENCOUNTER — Telehealth: Payer: Self-pay

## 2023-12-11 NOTE — Telephone Encounter (Signed)
 Patient was identified as falling into the [place] Measure - Diabetes.   Patient was: Referred to Diabetes Management.

## 2023-12-13 NOTE — Discharge Summary (Signed)
 [facility] Discharge Summary  Name: [name] MRN: [id number] Age: 73 yrs DOB: 02/12/1951  Admit date: 12/07/2023   Discharge date and time: 12/13/2023  1:20 PM  Admitting Physician: Glo Stanly Oris, MD Discharge Physician: : Aleene Sensor, MD  Admission Diagnoses:  Sepsis    (CMD) [A41.9]   Discharge Diagnoses:  #Sepsis 2/2 L fifth toe diabetic infection s/p I&D and toe amputation on 8/11, wound culture growing MSSA  Admission Condition: poor Discharged Condition: fair  Hospital Course:  For full details, please see H&P, progress notes, consult notes and ancillary notes. Briefly, [name] is a 73 y.o. 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    #Sepsis 2/2 L fifth toe diabetic infection s/p I&D and toe amputation on 8/11, wound culture growing MSSA #Lactic acidosis #Leukocytosis -Per patient's wife they noticed the wound on the left fifth toe at least a month ago, initially turned blackish so patient saw the podiatrist 3 weeks ago, he cleaned the wound in the office. Patient then started feeling worse to the point of hallucinating which is why wife decided to bring patient to the ED on 8/9. Patient found to have lactate of 4.3 with fever of 101.9, WBC of 15k. Started on broad spectrum abx. MRI of foot without osteo. Podiatry performed left foot I&D and 5th metatarsal head resection. Vascular surgery was involved who recommended ABI's and was considering angiogram. Lower extremity arterial duplex which shows no significant stenosis and multiphasic waveforms throughout (per vascular). The decided to defer angiogram  given good bleeding with his amputation (indicating good perfusion).  -Intra OP culture grew MSSA. Abx switched to Cefazolin on 8/14 by ID. Later, pathology report did not show signs of osteo, therefore ID recommending po keflex  500 mg TID until 12/23/23. He will need to follow up with podiatry outpatient.    #Pre-renal AKI Patient found to have CR of 1.43 on 8/14 from a baseline of ~1.1. This improved with IVF.   #Anemia Patient with acute on chronic anemia. No melena/hematochezia. B12 and folate nl. Iron profile concerning for anemia of chronic illness.   #Inclusion body myositis  Patient is on prednisone  and methotrexate , he has taken methotrexate  on 12/06/2023 we will continue the prednisone  8 mg daily.  And folic acid  daily. Holding Methotrexate  which he is on for inclusion body myositis this week, ID okay with this plan. Most likely okay to re-start next week starting on 8/22.    Physical Exam:  Vitals:   12/13/23 1154  BP: 136/79  Pulse: 101  Resp: 19  Temp: 98.6 F (37 C)  SpO2: 97%    Gen: NAD, resting comfortably in bed. HEENT: Normocephalic, atruamatic CV: Normal rate, regular rhythm. no murmur. no LE edema. Resp: No crackles or wheezing. Normal work of breathing. Abd: Soft. Non-tender. Non-distended. Neuro: Aox4, no gross neurologic deficits. MSK: Normal muscle tone. Normal ROM. Skin: No obvious rash or bruise. S/p L 5th toe amputation, which is covered. No obvious erythema or drainage around the wound.   Patient's Ordered Code Status: Full Code  Consults: IP CONSULT TO HOSPITALIST IP CONSULT TO PODIATRY WOUND OSTOMY EVAL AND TREAT IP CONSULT TO VASCULAR SURGERY IP CONSULT TO SPIRITUAL  CARE IP CONSULT TO INFECTIOUS DISEASES    Disposition: Home or Self Care   Patient Instructions:    Discharge Medications     PAUSE taking these medications      Sig Disp Refill Start End  methotrexate  2.5 mg tablet Wait to take this until: December 20, 2023  Take 15 mg  by mouth once a week.   0         New Medications      Sig Disp Refill Start End  cephALEXin  500 mg capsule Commonly known as: KEFLEX   Take 1 capsule (500 mg total) by mouth 3 (three) times a day for 10 days.  30 capsule  0         Modified Medications      Sig Disp Refill Start End  Gaviscon 95-358 mg/15 mL Susp oral suspension Generic drug: aluminum hydroxide-magnesium  carbonate What changed: See the new instructions.  AKE 2 TEASPOONFULS BY MOUTH THREE TIMES A DAY AS NEEDED FOR HEARTBURN   0     metFORMIN 500 mg 24 hr tablet Commonly known as: GLUCOPHAGE-XR What changed: See the new instructions.  Take 4 tablets daily  360 tablet  3         Medications To Continue      Sig Disp Refill Start End  acetaminophen  500 mg tablet Commonly known as: TYLENOL   Take 1,000 mg by mouth every 4 (four) hours as needed for moderate pain (4-6) or mild pain (1-3).   0     baclofen  20 mg tablet Commonly known as: LIORESAL   Take 1 tablet (20 mg total) by mouth daily.  90 tablet  0     carboxymethylcellulose 0.5 % Dpet Commonly known as: REFRESH PLUS    0     cetirizine 10 mg tablet Commonly known as: ZyrTEC  Take 10 mg by mouth daily.   0     cholecalciferol 1,000 unit (25 mcg) tablet Commonly known as: VITAMIN D3  Take 1 tablet by mouth every morning.   0     diclofenac sodium 1 % gel Commonly known as: VOLTAREN  Apply 1 g topically 3 (three) times a day as needed.   0     docusate sodium 100 mg capsule Commonly known as: COLACE  Take 200 mg by mouth daily.   0     DULoxetine 30 mg capsule Commonly known as: CYMBALTA  Take 1 capsule (30 mg total) by mouth 2 (two) times a day.  60 capsule  6     * Eliquis  5 mg Tab Generic drug: apixaban   Take 5 mg by mouth 2 (two) times a day.   0     * apixaban  5 mg Tab Commonly known as: ELIQUIS   Take 1 tablet by mouth 2 (two) times a day.   0     fluticasone  propionate 50 mcg/spray nasal spray Commonly  known as: FLONASE   Administer 2 sprays into each nostril daily as needed for allergies or rhinitis.   0     folic acid  1 mg tablet Commonly known as: FOLVITE   Take 3 mg by mouth daily.   0     FreeStyle Libre 2 Reader Generic drug: flash glucose scanning reader  Scan as needed for continuous glucose monitoring.  1 each  0     FreeStyle Libre 2 Sensor Kit Generic drug: flash glucose sensor  Inject 1 sensor to the skin every 14 days for continuous glucose monitoring.  6 kit  3  glucose monitoring kit Kit  Use as instructed to check BG.  Dx E11.65  1 each  0     insulin  aspart U-100 100 unit/mL injection Commonly known as: NovoLOG   Inject 15 Units under the skin 3 (three) times a day before meals.   0     insulin  glargine-yfgn 100 unit/mL (3 mL) pen Commonly known as: SEMGLEE   Inject 40 Units under the skin at bedtime.   0     lactulose  10 gram/15 mL solution Commonly known as: CHRONULAC   Take 10 g by mouth 2 (two) times a day as needed (constipation).   0     latanoprost  0.005 % ophthalmic solution Commonly known as: XALATAN   ADMINISTER 1 DROP INTO EACH EYES AT BEDTIME.  2.5 mL  11     levothyroxine  75 mcg tablet Commonly known as: SYNTHROID   Take 75 mcg by mouth Once Daily.  90 tablet  3     oxyBUTYnin 10 mg 24 hr tablet Commonly known as: DITROPAN XL  Take 10 mg by mouth daily.   0     pantoprazole  40 mg EC tablet Commonly known as: PROTONIX   Take 40 mg by mouth daily.   0     polyethylene glycol 17 gram Powd powder Commonly known as: MIRALAX   Take 17 g by mouth daily as needed for constipation.   0     * predniSONE  1 mg tablet Commonly known as: DELTASONE   Take 3 mg by mouth daily. Takes one tablet of 5mg  with three tablets of 1mg  for a total dose of 8mg  daily.   0     * predniSONE  5 mg tablet Commonly known as: DELTASONE   Take 5 mg by mouth daily. Takes one tablet of 5mg  with three tablets of 1mg  for a total dose of 8mg  daily.   0      QUEtiapine  100 mg tablet Commonly known as: SEROquel   Take 100 mg by mouth as needed in the morning and 100 mg as needed at noon and 100 mg as needed in the evening.   0     Repatha  SureClick 140 mg/mL Pnij Generic drug: evolocumab   Inject 140 mg under the skin every 14 (fourteen) days.   0     sertraline  100 mg tablet Commonly known as: ZOLOFT   Take 100 mg by mouth daily as needed.   0     simethicone 80 mg chewable tablet Commonly known as: MYLICON  Chew 80 mg daily as needed.   0     tadalafiL 20 mg tablet Commonly known as: CIALIS  Take 1 tablet (20 mg total) as needed for Erectile Dysfunction.  10 tablet  11     tamsulosin 0.4 mg Cap Commonly known as: FLOMAX  Take 0.4 mg by mouth daily.   0     torsemide  20 mg tablet Commonly known as: DEMADEX   Take 20 mg by mouth daily.   0     triamcinolone  acetonide 0.1 % paste Commonly known as: KENALOG   Apply 1 Application to teeth 2 (two) times a day as needed for mucositis.   0        * * There are duplicate medications prescribed to the patient             Follow-up  Future Appointments  Date Time Provider Department Center  03/17/2024 11:30 AM Ozell GORMAN Buster, MD [facility]U [facility] [address]  03/30/2024 10:50 AM Reyes Thresa Bracket, MD [facility] OPH [facility] None  Electronically signed by: Aleene Sensor, MD 12/13/2023  I spent 65 minutes talking with patient and/or reviewing patient's chart in preparation for discharge.  *Some images could not be shown.

## 2023-12-16 ENCOUNTER — Telehealth: Payer: Self-pay

## 2023-12-16 NOTE — Transitions of Care (Post Inpatient/ED Visit) (Signed)
   12/16/2023  Name: [name] MRN: [id number] DOB: [birth year]  Today's TOC FU Call Status: Today's TOC FU Call Status:: Unsuccessful Call (1st Attempt) Unsuccessful Call (1st Attempt) Date: 12/16/23  Attempted to reach the patient regarding the most recent Inpatient/ED visit.  Follow Up Plan: Additional outreach attempts will be made to reach the patient to complete the Transitions of Care (Post Inpatient/ED visit) call.   Shona Prow RN, CCM [place]  [facility]BC[facility]Population Health RN Care Manager [phone]

## 2023-12-17 ENCOUNTER — Telehealth: Payer: Self-pay

## 2023-12-17 DIAGNOSIS — E785 Hyperlipidemia, unspecified: Secondary | ICD-10-CM | POA: Diagnosis not present

## 2023-12-17 DIAGNOSIS — F4310 Post-traumatic stress disorder, unspecified: Secondary | ICD-10-CM | POA: Diagnosis not present

## 2023-12-17 DIAGNOSIS — N183 Chronic kidney disease, stage 3 unspecified: Secondary | ICD-10-CM | POA: Diagnosis not present

## 2023-12-17 DIAGNOSIS — E039 Hypothyroidism, unspecified: Secondary | ICD-10-CM | POA: Diagnosis not present

## 2023-12-17 DIAGNOSIS — T8131XD Disruption of external operation (surgical) wound, not elsewhere classified, subsequent encounter: Secondary | ICD-10-CM | POA: Diagnosis not present

## 2023-12-17 DIAGNOSIS — Z86711 Personal history of pulmonary embolism: Secondary | ICD-10-CM | POA: Diagnosis not present

## 2023-12-17 DIAGNOSIS — G7241 Inclusion body myositis [IBM]: Secondary | ICD-10-CM | POA: Diagnosis not present

## 2023-12-17 DIAGNOSIS — E669 Obesity, unspecified: Secondary | ICD-10-CM | POA: Diagnosis not present

## 2023-12-17 DIAGNOSIS — E11621 Type 2 diabetes mellitus with foot ulcer: Secondary | ICD-10-CM | POA: Diagnosis not present

## 2023-12-17 DIAGNOSIS — M868X7 Other osteomyelitis, ankle and foot: Secondary | ICD-10-CM | POA: Diagnosis not present

## 2023-12-17 DIAGNOSIS — Z7901 Long term (current) use of anticoagulants: Secondary | ICD-10-CM | POA: Diagnosis not present

## 2023-12-17 DIAGNOSIS — E1142 Type 2 diabetes mellitus with diabetic polyneuropathy: Secondary | ICD-10-CM | POA: Diagnosis not present

## 2023-12-17 DIAGNOSIS — E1122 Type 2 diabetes mellitus with diabetic chronic kidney disease: Secondary | ICD-10-CM | POA: Diagnosis not present

## 2023-12-17 DIAGNOSIS — E1169 Type 2 diabetes mellitus with other specified complication: Secondary | ICD-10-CM | POA: Diagnosis not present

## 2023-12-17 DIAGNOSIS — L97522 Non-pressure chronic ulcer of other part of left foot with fat layer exposed: Secondary | ICD-10-CM | POA: Diagnosis not present

## 2023-12-17 DIAGNOSIS — E11319 Type 2 diabetes mellitus with unspecified diabetic retinopathy without macular edema: Secondary | ICD-10-CM | POA: Diagnosis not present

## 2023-12-17 DIAGNOSIS — N529 Male erectile dysfunction, unspecified: Secondary | ICD-10-CM | POA: Diagnosis not present

## 2023-12-17 DIAGNOSIS — B9561 Methicillin susceptible Staphylococcus aureus infection as the cause of diseases classified elsewhere: Secondary | ICD-10-CM | POA: Diagnosis not present

## 2023-12-17 DIAGNOSIS — G4730 Sleep apnea, unspecified: Secondary | ICD-10-CM | POA: Diagnosis not present

## 2023-12-17 DIAGNOSIS — I509 Heart failure, unspecified: Secondary | ICD-10-CM | POA: Diagnosis not present

## 2023-12-17 DIAGNOSIS — Z89422 Acquired absence of other left toe(s): Secondary | ICD-10-CM | POA: Diagnosis not present

## 2023-12-17 DIAGNOSIS — H43813 Vitreous degeneration, bilateral: Secondary | ICD-10-CM | POA: Diagnosis not present

## 2023-12-17 DIAGNOSIS — I130 Hypertensive heart and chronic kidney disease with heart failure and stage 1 through stage 4 chronic kidney disease, or unspecified chronic kidney disease: Secondary | ICD-10-CM | POA: Diagnosis not present

## 2023-12-17 DIAGNOSIS — E1151 Type 2 diabetes mellitus with diabetic peripheral angiopathy without gangrene: Secondary | ICD-10-CM | POA: Diagnosis not present

## 2023-12-17 DIAGNOSIS — Z794 Long term (current) use of insulin: Secondary | ICD-10-CM | POA: Diagnosis not present

## 2023-12-17 NOTE — Transitions of Care (Post Inpatient/ED Visit) (Signed)
   12/17/2023  Name: Ronav Furney MRN: [id number] DOB: 1950/05/13  Today's TOC FU Call Status: Today's TOC FU Call Status:: Unsuccessful Call (2nd Attempt) Unsuccessful Call (2nd Attempt) Date: 12/17/23  Attempted to reach the patient regarding the most recent Inpatient/ED visit.  Follow Up Plan: Additional outreach attempts will be made to reach the patient to complete the Transitions of Care (Post Inpatient/ED visit) call.   Shona Prow RN, CCM [place]  VBCI-Population Health RN Care Manager [phone]

## 2023-12-17 NOTE — Transitions of Care (Post Inpatient/ED Visit) (Signed)
   12/17/2023  Name: [name] MRN: [id number] DOB: 06/10/1950  Today's TOC FU Call Status: Today's TOC FU Call Status:: Unsuccessful Call (3rd Attempt) Unsuccessful Call (3rd Attempt) Date: 12/17/23  Attempted to reach the patient regarding the most recent Inpatient/ED visit.  Follow Up Plan: No further outreach attempts will be made at this time. We have been unable to contact the patient.  Shona Prow RN, CCM [place]  VBCI-Population Health RN Care Manager [phone]

## 2023-12-19 DIAGNOSIS — B9561 Methicillin susceptible Staphylococcus aureus infection as the cause of diseases classified elsewhere: Secondary | ICD-10-CM | POA: Diagnosis not present

## 2023-12-19 DIAGNOSIS — M868X7 Other osteomyelitis, ankle and foot: Secondary | ICD-10-CM | POA: Diagnosis not present

## 2023-12-19 DIAGNOSIS — E11621 Type 2 diabetes mellitus with foot ulcer: Secondary | ICD-10-CM | POA: Diagnosis not present

## 2023-12-19 DIAGNOSIS — T8131XD Disruption of external operation (surgical) wound, not elsewhere classified, subsequent encounter: Secondary | ICD-10-CM | POA: Diagnosis not present

## 2023-12-19 DIAGNOSIS — E1169 Type 2 diabetes mellitus with other specified complication: Secondary | ICD-10-CM | POA: Diagnosis not present

## 2023-12-19 DIAGNOSIS — L97522 Non-pressure chronic ulcer of other part of left foot with fat layer exposed: Secondary | ICD-10-CM | POA: Diagnosis not present

## 2023-12-24 DIAGNOSIS — E11621 Type 2 diabetes mellitus with foot ulcer: Secondary | ICD-10-CM | POA: Diagnosis not present

## 2023-12-24 DIAGNOSIS — E1169 Type 2 diabetes mellitus with other specified complication: Secondary | ICD-10-CM | POA: Diagnosis not present

## 2023-12-24 DIAGNOSIS — M868X7 Other osteomyelitis, ankle and foot: Secondary | ICD-10-CM | POA: Diagnosis not present

## 2023-12-24 DIAGNOSIS — T8131XD Disruption of external operation (surgical) wound, not elsewhere classified, subsequent encounter: Secondary | ICD-10-CM | POA: Diagnosis not present

## 2023-12-24 DIAGNOSIS — B9561 Methicillin susceptible Staphylococcus aureus infection as the cause of diseases classified elsewhere: Secondary | ICD-10-CM | POA: Diagnosis not present

## 2023-12-24 DIAGNOSIS — L97522 Non-pressure chronic ulcer of other part of left foot with fat layer exposed: Secondary | ICD-10-CM | POA: Diagnosis not present

## 2023-12-25 ENCOUNTER — Telehealth: Payer: Self-pay

## 2023-12-25 NOTE — Telephone Encounter (Signed)
 I called and left a message asking if he has had a recent diabetic eye exam. We need to know the place and provider.

## 2023-12-26 ENCOUNTER — Encounter (HOSPITAL_BASED_OUTPATIENT_CLINIC_OR_DEPARTMENT_OTHER): Payer: Self-pay | Admitting: Emergency Medicine

## 2023-12-26 ENCOUNTER — Emergency Department (HOSPITAL_BASED_OUTPATIENT_CLINIC_OR_DEPARTMENT_OTHER)
Admission: EM | Admit: 2023-12-26 | Discharge: 2023-12-26 | Disposition: A | Discharge status: Home / Self Care | Attending: Emergency Medicine | Admitting: Emergency Medicine

## 2023-12-26 ENCOUNTER — Other Ambulatory Visit: Payer: Self-pay

## 2023-12-26 DIAGNOSIS — Z79899 Other long term (current) drug therapy: Secondary | ICD-10-CM | POA: Diagnosis not present

## 2023-12-26 DIAGNOSIS — N189 Chronic kidney disease, unspecified: Secondary | ICD-10-CM | POA: Insufficient documentation

## 2023-12-26 DIAGNOSIS — I509 Heart failure, unspecified: Secondary | ICD-10-CM | POA: Diagnosis not present

## 2023-12-26 DIAGNOSIS — Z7984 Long term (current) use of oral hypoglycemic drugs: Secondary | ICD-10-CM | POA: Insufficient documentation

## 2023-12-26 DIAGNOSIS — L500 Allergic urticaria: Secondary | ICD-10-CM | POA: Diagnosis not present

## 2023-12-26 DIAGNOSIS — L299 Pruritus, unspecified: Secondary | ICD-10-CM

## 2023-12-26 DIAGNOSIS — L509 Urticaria, unspecified: Secondary | ICD-10-CM

## 2023-12-26 DIAGNOSIS — R21 Rash and other nonspecific skin eruption: Secondary | ICD-10-CM | POA: Diagnosis present

## 2023-12-26 DIAGNOSIS — E1122 Type 2 diabetes mellitus with diabetic chronic kidney disease: Secondary | ICD-10-CM | POA: Insufficient documentation

## 2023-12-26 DIAGNOSIS — Z7901 Long term (current) use of anticoagulants: Secondary | ICD-10-CM | POA: Diagnosis not present

## 2023-12-26 DIAGNOSIS — Z794 Long term (current) use of insulin: Secondary | ICD-10-CM | POA: Insufficient documentation

## 2023-12-26 DIAGNOSIS — E039 Hypothyroidism, unspecified: Secondary | ICD-10-CM | POA: Insufficient documentation

## 2023-12-26 DIAGNOSIS — T7840XA Allergy, unspecified, initial encounter: Secondary | ICD-10-CM

## 2023-12-26 MED ORDER — FAMOTIDINE 20 MG PO TABS
20.0000 mg | ORAL_TABLET | Freq: Once | ORAL | Status: AC
  Administered 2023-12-26: 20 mg via ORAL
  Filled 2023-12-26: qty 1

## 2023-12-26 MED ORDER — EPINEPHRINE 0.3 MG/0.3ML IJ SOAJ: 0.3000 mg | INTRAMUSCULAR | 0 refills | Status: AC | PRN

## 2023-12-26 MED ORDER — PREDNISONE 20 MG PO TABS: 40.0000 mg | ORAL_TABLET | Freq: Every day | ORAL | 0 refills | Status: AC

## 2023-12-26 MED ORDER — DIPHENHYDRAMINE HCL 25 MG PO CAPS
50.0000 mg | ORAL_CAPSULE | Freq: Once | ORAL | Status: AC
  Administered 2023-12-26: 50 mg via ORAL
  Filled 2023-12-26: qty 2

## 2023-12-26 MED ORDER — PREDNISONE 50 MG PO TABS
50.0000 mg | ORAL_TABLET | Freq: Once | ORAL | Status: AC
  Administered 2023-12-26: 50 mg via ORAL
  Filled 2023-12-26: qty 1

## 2023-12-26 NOTE — ED Triage Notes (Signed)
 Pt ambulatory with personal rolator walker.   Pt states he was prescribed antibiotics (cephalexin ) appx 14 days ago, taken for 10 days.  C/o shaking, insomnia, hives, itching, itching around testicles, elevated HR.   Denies known allergy to this particular antibiotic.

## 2023-12-26 NOTE — Discharge Instructions (Signed)
 Your history, exam, and evaluation today seem consistent alert reaction that may have been related to the recent antibiotic use.  Due to the itching, rash, improving tongue swelling, and other symptoms we agreed to treat you with a burst of steroids for the next several days and you may take antihistamines for the itching at home.  We also agreed to give you prescription for an EpiPen  in case your symptoms drastically worsen with tongue mouth or throat swelling.  Please rest and stay hydrated and follow-up with your primary doctor.  Please call them to discuss any changes to your other medications they may want to do.  If any symptoms change or worsen acutely, return to the nearest emergency department.

## 2023-12-26 NOTE — ED Provider Notes (Signed)
 [place] EMERGENCY DEPARTMENT AT [facility] [place] Provider Note   CSN: [id number] Arrival date & time: 12/26/23  1503     Patient presents with: Allergic Reaction   [name] is a 73 y.o. male.   The history is provided by medical records, the spouse and the patient. No language interpreter was used.  Allergic Reaction Presenting symptoms: itching, rash and swelling   Presenting symptoms: no difficulty breathing, no difficulty swallowing and no wheezing   Rash:    Location:  Chest and back   Quality: itchiness and redness     Severity:  Moderate   Onset quality:  Gradual   Duration:  5 days   Timing:  Constant   Progression:  Improving Relieved by:  Nothing Worsened by:  Nothing Ineffective treatments:  None tried      Prior to Admission medications   Medication Sig Start Date End Date Taking? Authorizing Provider  acetaminophen  (TYLENOL ) 500 MG tablet Take 500-1,000 mg by mouth See admin instructions. Take 1000 mg by mouth in the morning and 500 mg at bedtime    [provider]  AMBULATORY NON FORMULARY MEDICATION Please provide 3 in 1 bedside commode.  Dx:R29.6, M62.81 04/16/22   Alvia Bring, DO  AMBULATORY NON FORMULARY MEDICATION Please provide bilateral thigh high compression stockings: Graded compression of 20-19mmhg 09/05/22   Alvia Bring, DO  amitriptyline  (ELAVIL ) 25 MG tablet Take 1 tab po qhs 03/13/23   Alvia Bring, DO  apixaban  (ELIQUIS ) 5 MG TABS tablet Take 1 tablet (5 mg total) by mouth 2 (two) times daily. 08/09/22   Vannie Reche RAMAN, NP  baclofen  (LIORESAL ) 20 MG tablet TAKE 1 TABLET BY MOUTH TWICE A DAY 01/30/22   Alvia Bring, DO  bisacodyl  (DULCOLAX) 10 MG suppository Place 1 suppository (10 mg total) rectally as needed for moderate constipation. 08/20/23   Francesca Elsie CROME, MD  calcium  carbonate (OS-CAL) 600 MG TABS tablet Take 600 mg by mouth daily.    [provider]  Calcium  Carbonate Antacid (TUMS PO) Take 3  tablets by mouth 2 (two) times daily as needed (heartburn).    [provider]  Evolocumab  (REPATHA  SURECLICK) 140 MG/ML SOAJ Inject 140 mg into the skin every 14 (fourteen) days. Patient not taking: Reported on 10/09/2023 06/25/23   Vannie Reche RAMAN, NP  Evolocumab  (REPATHA  SURECLICK) 140 MG/ML SOAJ Inject 140 mg into the skin every 14 (fourteen) days. 10/09/23   Daneen Damien BROCKS, NP  folic acid  (FOLVITE ) 1 MG tablet Take 2 mg by mouth daily.    [provider]  insulin  glargine (LANTUS ) 100 UNIT/ML injection Inject 0.38 mLs (38 Units total) into the skin daily. 09/15/22   Cheryle Page, MD  insulin  glargine-yfgn (SEMGLEE ) 100 UNIT/ML Pen Inject into the skin. 01/17/23   [provider]  lactulose  (CHRONULAC ) 10 GM/15ML solution Take 15 mLs (10 g total) by mouth 3 (three) times daily. 08/20/23   Francesca Elsie CROME, MD  latanoprost  (XALATAN ) 0.005 % ophthalmic solution 1 drop at bedtime. 08/17/22   [provider]  Levothyroxine  Sodium 75 MCG CAPS Take 75 mcg by mouth daily.    [provider]  loratadine  (CLARITIN ) 10 MG tablet Take 10 mg by mouth daily. 12/12/21   [provider]  metFORMIN (GLUCOPHAGE-XR) 500 MG 24 hr tablet Take by mouth. 09/25/22   [provider]  methotrexate  (RHEUMATREX) 5 MG tablet Take 3 tablets by mouth once a week. Caution: Chemotherapy. Protect from light.    [provider]  metoprolol  succinate (TOPROL  XL) 25 MG 24 hr tablet Take 0.5 tablets (12.5 mg total) by mouth daily. 11/20/22   Vannie Reche RAMAN, NP  mirabegron  ER (MYRBETRIQ ) 50 MG TB24 tablet Take 1 tablet (50 mg total) by mouth daily. 03/13/23   Alvia Bring, DO  Multiple Vitamins-Minerals (PRESERVISION AREDS 2) CAPS Take 2 caps po daily 12/05/22   Alvia Bring, DO  pantoprazole  (PROTONIX ) 40 MG tablet TAKE 1 TABLET BY MOUTH EVERY DAY 05/17/23   Alvia Bring, DO  polyethylene glycol powder (MIRALAX ) 17 GM/SCOOP powder Take 17 g by mouth in the  morning, at noon, and at bedtime. Take TID until having regular bowel movement, then QD 08/20/23   Francesca Elsie CROME, MD  polyvinyl alcohol  (LIQUIFILM TEARS) 1.4 % ophthalmic solution Place 1 drop into both eyes in the morning and at bedtime.    [provider]  predniSONE  (DELTASONE ) 10 MG tablet Take 10 mg by mouth daily with breakfast. Take with 5 mg tablet to equal 15 mg    [provider]  QUEtiapine  (SEROQUEL ) 50 MG tablet Take by mouth. 09/21/22   [provider]  Semaglutide,0.25 or 0.5MG /DOS, 2 MG/3ML SOPN Inject 0.25 mLs into the skin once a week. 01/17/23   [provider]  sertraline  (ZOLOFT ) 25 MG tablet Take 2 tablets (50 mg total) by mouth daily. Take 50mg  daily. 09/15/22   Cheryle Page, MD  tamsulosin (FLOMAX) 0.4 MG CAPS capsule Take by mouth. 09/21/22   [provider]  torsemide  (DEMADEX ) 20 MG tablet Take 1 tablet (20 mg total) by mouth daily. May take additional tablet as directed by lymphedema clinic or cardiologist. 11/11/23   Daneen Damien BROCKS, NP  triamcinolone  (KENALOG ) 0.1 % paste Apply topically. Patient not taking: Reported on 10/09/2023 09/21/22   [provider]  triamcinolone  cream (KENALOG ) 0.1 % Apply 1 Application topically 2 (two) times daily. Apply bid as needed for rash Patient not taking: Reported on 10/09/2023 12/05/22   Alvia Bring, DO    Allergies: Semaglutide, Benazepril, Gabapentin , Hydrocodone, and Shellfish allergy    Review of Systems  Constitutional:  Positive for fatigue. Negative for chills and fever.  HENT:  Negative for congestion and trouble swallowing.   Eyes:  Negative for visual disturbance.  Respiratory:  Negative for cough, chest tightness, shortness of breath and wheezing.   Cardiovascular:  Negative for chest pain and palpitations.  Gastrointestinal:  Positive for diarrhea, nausea and vomiting. Negative for abdominal distention, abdominal pain and constipation.  Genitourinary:  Negative  for dysuria, flank pain and frequency.  Musculoskeletal:  Negative for back pain, neck pain and neck stiffness.  Skin:  Positive for itching, rash and wound.  Neurological:  Negative for weakness, light-headedness, numbness and headaches.  Psychiatric/Behavioral:  Negative for agitation and confusion.   All other systems reviewed and are negative.   Updated Vital Signs BP 131/68 (BP Location: Right Arm)   Pulse (!) 120   Temp 98.1 F (36.7 C) (Oral)   Resp 16   Ht 5' 8 (1.727 m)   Wt 103.4 kg   SpO2 98%   BMI 34.67 kg/m   Physical Exam Constitutional:      General: He is not in acute distress.    Appearance: He is well-developed. He is not ill-appearing, toxic-appearing or diaphoretic.  HENT:     Head: Normocephalic and atraumatic.     Right Ear: External ear normal.     Left Ear: External ear normal.     Nose:  Nose normal. No congestion or rhinorrhea.     Mouth/Throat:     Mouth: Mucous membranes are moist.     Pharynx: No oropharyngeal exudate or posterior oropharyngeal erythema.  Eyes:     Extraocular Movements: Extraocular movements intact.     Conjunctiva/sclera: Conjunctivae normal.     Pupils: Pupils are equal, round, and reactive to light.  Cardiovascular:     Rate and Rhythm: Tachycardia present.     Pulses: Normal pulses.  Pulmonary:     Effort: No respiratory distress.     Breath sounds: No stridor. No wheezing, rhonchi or rales.  Chest:     Chest wall: No tenderness.  Abdominal:     General: Abdomen is flat.     Palpations: Abdomen is soft.     Tenderness: There is no abdominal tenderness. There is no right CVA tenderness, left CVA tenderness, guarding or rebound.  Musculoskeletal:        General: No tenderness.     Cervical back: Normal range of motion and neck supple. No tenderness.     Right lower leg: Edema present.     Left lower leg: Edema present.  Skin:    General: Skin is warm.     Capillary Refill: Capillary refill takes less than 2  seconds.     Findings: Erythema and rash present. No bruising.     Comments: Patient has some diffuse pruritic red urticarial appearing rash on his low back, left side, and chest.  Neurological:     Mental Status: He is alert and oriented to person, place, and time.     Cranial Nerves: No cranial nerve deficit.     Sensory: No sensory deficit.     Motor: No weakness or abnormal muscle tone.     Deep Tendon Reflexes: Reflexes normal.     (all labs ordered are listed, but only abnormal results are displayed) Labs Reviewed - No data to display  EKG: None  Radiology: No results found.   Procedures   Medications Ordered in the ED  predniSONE  (DELTASONE ) tablet 50 mg (50 mg Oral Given 12/26/23 1535)  diphenhydrAMINE  (BENADRYL ) capsule 50 mg (50 mg Oral Given 12/26/23 1535)  famotidine  (PEPCID ) tablet 20 mg (20 mg Oral Given 12/26/23 1535)                                    Medical Decision Making Risk Prescription drug management.    [name] is a 73 y.o. male with a past medical history significant for diabetes, hypothyroidism, GERD, previous pulmonary embolism on Eliquis  therapy, heart failure, CKD, rheumatoid arthritis, and previous left foot surgery with wound who presents with allergic reaction.  According to patient, he completed a round of Keflex  several days ago and while taking the medication and since then he has been having allergic symptoms including urticarial rash, pruritus, some mild feeling of tongue swelling that is improving, nausea, occasional vomiting, some loose stools, and fatigue.  He says this feels like he is having allergic reaction and seems to have started to improve with home Benadryl  and completion of the Keflex .  He reports he is not having chest pain or shortness of breath and otherwise started feel better.  He denies other systemic symptoms and denies any chest pain abdominal pain, flank pain or back pain.  He is not urinary changes.  Denies any  worsening rashes and things seem to be starting  to stabilize.  On exam, lungs clear.  Chest nontender.  Abdomen nontender.  Left foot is wrapped in a bandage and is not bothering him at this time.  No tenderness of the legs.  He has some diffuse urticarial rash in different areas of his body clued his low back, side, upper back, and chest.  He does not have rash on his palms.  Does not have rash in his mouth.  I did not hear stridor on exam he did not have any wheezing.  I do not see any tongue swelling he reports it has improved.  No other facial swelling.  Exam otherwise unremarkable.  Clinically I do suspect a mild allergic reaction.  We discussed that since he is still having some mild sensation that his tongue is swollen and he still having the itching, nausea, and rash, we feel is appropriate to give him some steroids and antihistamines.  Will also give him a prescription for EpiPen  but do not feel he needs it at this time and he agrees.  We had a shared decision made conversation with patient and family and agreed to hold on more extensive lab or imaging workup as he thinks his symptoms are related to allergic reaction and I tend to agree based on the appearance.  Patient and family's that he does take a lower dose steroid daily but he agrees with us  giving him a burst for the next 5 days.  Will give Pepcid , Benadryl , and prednisone  and observe him for a period of time.  If he does well and symptoms lessen, and dissipate discharge home with prescription for several days of a burst of steroid, and instructions to take antihistamines to help with pruritus and will also give prescription for EpiPen  and follow-up with PCP.  Anticipate reassessment after medications.   4:52 PM Patient reports her symptoms are starting to improve after the steroids and antihistamines.  He is feeling well would like to go home.  He will talk to his PCP about medication titration and changes and agrees with management  plan.  Family agrees as well.  Will discharge for outpatient follow-up.      Final diagnoses:  Allergic reaction, initial encounter  Urticaria  Pruritus    ED Discharge Orders          Ordered    predniSONE  (DELTASONE ) 20 MG tablet  Daily        12/26/23 1653    EPINEPHrine  0.3 mg/0.3 mL IJ SOAJ injection  As needed        12/26/23 1653            Clinical Impression: 1. Allergic reaction, initial encounter   2. Urticaria   3. Pruritus     Disposition: Discharge  Condition: Good  I have discussed the results, Dx and Tx plan with the pt(& family if present). He/she/they expressed understanding and agree(s) with the plan. Discharge instructions discussed at great length. Strict return precautions discussed and pt &/or family have verbalized understanding of the instructions. No further questions at time of discharge.    New Prescriptions   EPINEPHRINE  0.3 MG/0.3 ML IJ SOAJ INJECTION    Inject 0.3 mg into the muscle as needed for anaphylaxis.   PREDNISONE  (DELTASONE ) 20 MG TABLET    Take 2 tablets (40 mg total) by mouth daily for 4 days.    Follow Up: Alvia Bring, DO [address] [facility] [address] [place] [address] [phone]     [facility] Emergency  Department at [facility] [address] [place]  [address] [phone]        Pansy Ostrovsky, Lonni PARAS, MD 12/26/23 [phone]

## 2023-12-27 DIAGNOSIS — T8131XD Disruption of external operation (surgical) wound, not elsewhere classified, subsequent encounter: Secondary | ICD-10-CM | POA: Diagnosis not present

## 2023-12-27 DIAGNOSIS — E1169 Type 2 diabetes mellitus with other specified complication: Secondary | ICD-10-CM | POA: Diagnosis not present

## 2023-12-27 DIAGNOSIS — B9561 Methicillin susceptible Staphylococcus aureus infection as the cause of diseases classified elsewhere: Secondary | ICD-10-CM | POA: Diagnosis not present

## 2023-12-27 DIAGNOSIS — E11621 Type 2 diabetes mellitus with foot ulcer: Secondary | ICD-10-CM | POA: Diagnosis not present

## 2023-12-27 DIAGNOSIS — L97522 Non-pressure chronic ulcer of other part of left foot with fat layer exposed: Secondary | ICD-10-CM | POA: Diagnosis not present

## 2023-12-27 DIAGNOSIS — M868X7 Other osteomyelitis, ankle and foot: Secondary | ICD-10-CM | POA: Diagnosis not present

## 2023-12-31 ENCOUNTER — Other Ambulatory Visit: Payer: Self-pay

## 2023-12-31 ENCOUNTER — Emergency Department (HOSPITAL_BASED_OUTPATIENT_CLINIC_OR_DEPARTMENT_OTHER)
Admission: EM | Admit: 2023-12-31 | Discharge: 2023-12-31 | Discharge status: Against Medical Advice | Attending: Emergency Medicine | Admitting: Emergency Medicine

## 2023-12-31 ENCOUNTER — Encounter (HOSPITAL_BASED_OUTPATIENT_CLINIC_OR_DEPARTMENT_OTHER): Payer: Self-pay | Admitting: Emergency Medicine

## 2023-12-31 DIAGNOSIS — R21 Rash and other nonspecific skin eruption: Secondary | ICD-10-CM | POA: Diagnosis not present

## 2023-12-31 DIAGNOSIS — Z5321 Procedure and treatment not carried out due to patient leaving prior to being seen by health care provider: Secondary | ICD-10-CM | POA: Insufficient documentation

## 2023-12-31 NOTE — ED Notes (Signed)
Patient eloped from department

## 2023-12-31 NOTE — ED Triage Notes (Signed)
 Pt c/o hives to CL shoulders and down back, headaches, itching, lower back pain that has been ongoing x 2.5 weeks.  Recently seen for same, reports no improvement in sx.

## 2023-12-31 NOTE — ED Provider Notes (Incomplete)
 [place] [facility]GENCY DEPARTMENT AT [facility] [place] Provider [place]   CSN: [id number] Arrival date & time: 12/31/23  1550     Patient presents with: Rash   [name] is a 73 y.o. male.  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   He had been seen in the emergency department on August 28 with concern for rash, pruritus and allergic reaction and was given Pepcid , Benadryl  and prednisone .  Was started on cephalexin  after hospitalization 8/15.   Prior to Admission medications   Medication Sig Start Date End Date Taking? Authorizing Provider  acetaminophen  (TYLENOL ) 500 MG tablet Take 500-1,000 mg by mouth See admin instructions. Take 1000 mg by mouth in the morning and 500 mg at bedtime    [provider]  AMBULATORY NON FORMULARY MEDICATION Please provide 3 in 1 bedside commode.  Dx:R29.6, M62.81 04/16/22   Alvia Bring, DO  AMBULATORY NON FORMULARY MEDICATION Please provide bilateral thigh high compression stockings: Graded compression of 20-72mmhg 09/05/22   Alvia Bring, DO  amitriptyline  (ELAVIL ) 25 MG tablet Take 1 tab po qhs 03/13/23   Alvia Bring, DO  apixaban  (ELIQUIS ) 5 MG TABS tablet Take 1 tablet (5 mg total) by mouth 2 (two) times daily. 08/09/22   Vannie Reche RAMAN, NP  baclofen  (LIORESAL ) 20 MG tablet TAKE 1 TABLET BY MOUTH TWICE A DAY 01/30/22   Alvia Bring, DO  bisacodyl  (DULCOLAX) 10 MG suppository Place 1 suppository (10 mg total) rectally as needed for moderate constipation. 08/20/23   Francesca Elsie CROME, MD  calcium  carbonate (OS-CAL) 600 MG TABS tablet Take 600 mg by mouth daily.    [provider]  Calcium  Carbonate Antacid (TUMS PO) Take 3 tablets by mouth 2 (two) times daily as needed (heartburn).    [provider]  EPINEPHrine  0.3 mg/0.3 mL IJ SOAJ injection Inject 0.3 mg into the muscle as needed for anaphylaxis. 12/26/23   Tegeler, Lonni PARAS, MD  Evolocumab  (REPATHA  SURECLICK) 140 MG/ML SOAJ Inject 140 mg into the skin every 14 (fourteen) days. Patient not taking: Reported on 10/09/2023 06/25/23   Vannie Reche RAMAN, NP  Evolocumab  (REPATHA  SURECLICK) 140 MG/ML SOAJ Inject 140 mg into the skin every 14 (fourteen) days. 10/09/23   Daneen Damien BROCKS, NP  folic acid  (FOLVITE ) 1 MG tablet Take 2 mg by mouth daily.    [provider]  insulin  glargine (LANTUS ) 100 UNIT/ML injection Inject 0.38 mLs (38 Units total) into the skin daily. 09/15/22   Cheryle Page, MD  insulin  glargine-yfgn (SEMGLEE ) 100 UNIT/ML Pen Inject into the skin. 01/17/23   [provider]  lactulose  (CHRONULAC ) 10 GM/15ML solution Take 15 mLs (10 g total) by mouth 3 (three) times daily. 08/20/23   Francesca Elsie CROME, MD  latanoprost  (XALATAN ) 0.005 % ophthalmic solution 1 drop at bedtime. 08/17/22   [provider]  Levothyroxine  Sodium 75 MCG CAPS Take 75 mcg by mouth daily.    [provider]  loratadine  (CLARITIN ) 10 MG tablet Take 10 mg by mouth daily. 12/12/21   [provider]  metFORMIN (GLUCOPHAGE-XR) 500 MG 24 hr tablet  Take by mouth. 09/25/22   [provider]  methotrexate  (RHEUMATREX) 5 MG tablet Take 3 tablets by mouth once a week. Caution: Chemotherapy. Protect from light.    [provider]  metoprolol  succinate (TOPROL  XL) 25 MG 24 hr tablet Take 0.5 tablets (12.5 mg total) by mouth daily. 11/20/22   Vannie Reche RAMAN, NP  mirabegron  ER (MYRBETRIQ ) 50 MG TB24 tablet Take 1 tablet (50 mg total) by mouth daily. 03/13/23    Alvia Bring, DO  Multiple Vitamins-Minerals (PRESERVISION AREDS 2) CAPS Take 2 caps po daily 12/05/22   Alvia Bring, DO  pantoprazole  (PROTONIX ) 40 MG tablet TAKE 1 TABLET BY MOUTH EVERY DAY 05/17/23   Alvia Bring, DO  polyethylene glycol powder (MIRALAX ) 17 GM/SCOOP powder Take 17 g by mouth in the morning, at noon, and at bedtime. Take TID until having regular bowel movement, then QD 08/20/23   Francesca Elsie CROME, MD  polyvinyl alcohol  (LIQUIFILM TEARS) 1.4 % ophthalmic solution Place 1 drop into both eyes in the morning and at bedtime.    [provider]  predniSONE  (DELTASONE ) 10 MG tablet Take 10 mg by mouth daily with breakfast. Take with 5 mg tablet to equal 15 mg    [provider]  predniSONE  (DELTASONE ) 20 MG tablet Take 2 tablets (40 mg total) by mouth daily for 4 days. 12/27/23 12/31/23  Tegeler, Lonni PARAS, MD  QUEtiapine  (SEROQUEL ) 50 MG tablet Take by mouth. 09/21/22   [provider]  Semaglutide,0.25 or 0.5MG /DOS, 2 MG/3ML SOPN Inject 0.25 mLs into the skin once a week. 01/17/23   [provider]  sertraline  (ZOLOFT ) 25 MG tablet Take 2 tablets (50 mg total) by mouth daily. Take 50mg  daily. 09/15/22   Cheryle Page, MD  tamsulosin (FLOMAX) 0.4 MG CAPS capsule Take by mouth. 09/21/22   [provider]  torsemide  (DEMADEX ) 20 MG tablet Take 1 tablet (20 mg total) by mouth daily. May take additional tablet as directed by lymphedema clinic or cardiologist. 11/11/23   Daneen Damien BROCKS, NP  triamcinolone  (KENALOG ) 0.1 % paste Apply topically. Patient not taking: Reported on 10/09/2023 09/21/22   [provider]  triamcinolone  cream (KENALOG ) 0.1 % Apply 1 Application topically 2 (two) times daily. Apply bid as needed for rash Patient not taking: Reported on 10/09/2023 12/05/22   Alvia Bring, DO    Allergies: Semaglutide, Benazepril, Gabapentin , Hydrocodone, and Shellfish allergy    Review of Systems  Updated Vital Signs BP 127/66  (BP Location: Left Arm)   Pulse (!) 101   Temp 98.3 F (36.8 C) (Oral)   Resp 19   SpO2 95%   Physical Exam  (all labs ordered are listed, but only abnormal results are displayed) Labs Reviewed - No data to display  EKG: None  Radiology: No results found.  {Document cardiac monitor, telemetry assessment procedure when appropriate:32947} Procedures   Medications Ordered in the ED - No data to display    {Click here for ABCD2, HEART and other calculators REFRESH Note before signing:1}                              Medical Decision Making  ***  {Document critical care time when appropriate  Document review of labs and clinical decision tools ie [id number], etc  Document your independent review of radiology images and any outside records  Document your discussion with family members, caretakers and with consultants  Document social determinants of health  affecting pt's care  Document your decision making why or why not admission, treatments were needed:32947:::1}   Final diagnoses:  None    ED Discharge Orders     None

## 2024-01-01 NOTE — ED Provider Notes (Signed)
 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   He had been seen in the emergency department on August 28 with concern for rash, pruritus and allergic reaction and was given Pepcid , Benadryl  and prednisone .  Was started on cephalexin  after hospitalization 8/15.    He left the emergency department prior to my evaluation.   Dreama Longs, MD 01/01/24 1048

## 2024-01-02 ENCOUNTER — Other Ambulatory Visit: Payer: Self-pay

## 2024-01-02 ENCOUNTER — Ambulatory Visit
Admission: EM | Admit: 2024-01-02 | Discharge: 2024-01-02 | Disposition: A | Discharge status: Home / Self Care | Attending: Family Medicine | Admitting: Family Medicine

## 2024-01-02 DIAGNOSIS — L509 Urticaria, unspecified: Secondary | ICD-10-CM | POA: Diagnosis not present

## 2024-01-02 DIAGNOSIS — T7840XD Allergy, unspecified, subsequent encounter: Secondary | ICD-10-CM

## 2024-01-02 MED ORDER — HYDROXYZINE HCL 25 MG PO TABS: 25.0000 mg | ORAL_TABLET | Freq: Four times a day (QID) | ORAL | 0 refills | Status: DC | PRN

## 2024-01-02 MED ORDER — CETIRIZINE HCL 10 MG PO TABS
20.0000 mg | ORAL_TABLET | Freq: Once | ORAL | Status: AC
  Administered 2024-01-02: 20 mg via ORAL

## 2024-01-02 MED ORDER — CETIRIZINE HCL 10 MG PO TABS: 10.0000 mg | ORAL_TABLET | Freq: Every day | ORAL | 0 refills | Status: AC

## 2024-01-02 MED ORDER — METHYLPREDNISOLONE ACETATE 80 MG/ML IJ SUSP
80.0000 mg | Freq: Once | INTRAMUSCULAR | Status: AC
  Administered 2024-01-02: 80 mg via INTRAMUSCULAR

## 2024-01-02 NOTE — ED Provider Notes (Signed)
 [name]    CSN: [id number] Arrival date & time: 01/02/24  1428      History   Chief Complaint Chief Complaint  Patient presents with   Allergic Reaction   Urticaria    HPI [name] is a 73 y.o. male.   73 year old gentleman who is seen and hospitalized earlier in August for a foot and infection.  Known diabetic.  Was placed on Keflex .  States he has not been on Keflex  before.  Discharge 12/13/2023 on Keflex .  Was seen in the emergency room on 12/26/2023 for urticaria.  It was thought possibly due to an allergic reaction to Keflex .  He is back today with continued urticaria.  He took 5 days of steroids.  He is taking Benadryl  at bedtime.  In spite of this his rash is spreading and itches terribly.  He is very uncomfortable from the rash and is not sleeping at night.  He is here with his wife.  No lip or mouth swelling.  No shortness of breath.  No prior allergic reactions.    Past Medical History:  Diagnosis Date   Atopic dermatitis 12/09/2022   Autoimmune disease (HCC)    Bronchitis    Chronic HFrEF (heart failure with reduced ejection fraction) (HCC)    normalized EF   Chronic hiccups    Chronic kidney disease, stage 3a (HCC)    DM2 (diabetes mellitus, type 2) (HCC) 09/28/2014   DVT (deep venous thrombosis) (HCC)    GERD (gastroesophageal reflux disease)    Hypogonadism in male 04/01/2018   Hypothyroidism    Joint pain    Necrotizing myopathy    Nonobstructive atherosclerosis of coronary artery    Posttraumatic stress disorder 01/30/2022   PTSD (post-traumatic stress disorder)    Pulmonary embolism (HCC)    Rheumatoid arthritis (HCC) 08/09/2021   RSV (respiratory syncytial virus pneumonia) 03/11/2022    Patient Active Problem List   Diagnosis Date Noted   Constipation 04/25/2023   Dyspnea 03/17/2023   Posterior vitreous detachment of both eyes 03/14/2023   Overactive bladder 12/09/2022   Atopic dermatitis 12/09/2022   Long term (current) use of  anticoagulants 12/05/2022   Lymphedema 09/13/2022   Venous stasis ulcer without varicose veins (HCC) 09/09/2022   Anemia 09/09/2022   Oropharyngeal dysphagia 04/23/2022   Leg swelling 04/23/2022   Confusion 04/23/2022   Type 2 diabetes mellitus with other diabetic ophthalmic complication (HCC) 04/16/2022   Shoulder pain 03/22/2022   Hypoglycemia 03/20/2022   Frequent falls 03/19/2022   Chronic combined systolic and diastolic CHF (congestive heart failure) (HCC) 03/11/2022   CKD (chronic kidney disease), stage III (HCC) 03/11/2022   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   History of DVT (deep vein thrombosis) 03/11/2022   History of pulmonary embolism 03/11/2022   Hyperlipidemia 03/11/2022   Obesity (BMI 30-39.9) 03/11/2022   CAD (coronary artery disease) 01/30/2022   Depression 01/30/2022   Cardiomyopathy (HCC) 01/30/2022   Posttraumatic stress disorder 01/30/2022   Rheumatoid arthritis (HCC) 08/09/2021   New onset of headaches after age 89 08/09/2021   Non-ST elevation (NSTEMI) myocardial infarction [facility])    Acute pulmonary embolism (HCC) 07/22/2021   Acute DVT (deep venous thrombosis) (HCC) 07/22/2021   Elevated troponin 07/22/2021   Anxiety 07/22/2021   Ankle swelling 01/17/2020   GERD (gastroesophageal reflux disease) 06/04/2018   Hypogonadism in male 04/01/2018   Macrocytic anemia 09/30/2017   Itching 09/30/2017   Hypothyroidism 09/30/2017   Low testosterone  09/30/2017   Benign prostatic hyperplasia without lower urinary tract  symptoms 09/30/2017   Erectile dysfunction 03/19/2016   Benign essential HTN 08/08/2015   Branch retinal vein occlusion with macular edema of left eye 03/25/2015   Nuclear sclerotic cataract of both eyes 03/25/2015   Myositis 09/28/2014   DM2 (diabetes mellitus, type 2) (HCC) 09/28/2014   Abnormal glucose 10/04/2012    Past Surgical History:  Procedure Laterality Date   BIOPSY SHOULDER     Left   IR ANGIOGRAM PULMONARY BILATERAL SELECTIVE   07/22/2021   IR THROMBECT VENO MECH MOD SED  07/22/2021   IR US  GUIDE VASC ACCESS LEFT  07/22/2021   RADIOLOGY WITH ANESTHESIA Right 07/22/2021   Procedure: IR WITH ANESTHESIA;  Surgeon: Radiologist, Medication, MD;  Location: [facility] OR;  Service: Radiology;  Laterality: Right;   UPPER LEG SOFT TISSUE BIOPSY Right        Home Medications    Prior to Admission medications   Medication Sig Start Date End Date Taking? Authorizing Provider  cetirizine  (ZYRTEC ) 10 MG tablet Take 1 tablet (10 mg total) by mouth daily. 01/02/24  Yes Maranda Jamee Jacob, MD  hydrOXYzine  (ATARAX ) 25 MG tablet Take 1 tablet (25 mg total) by mouth every 6 (six) hours as needed. 01/02/24  Yes Maranda Jamee Jacob, MD  acetaminophen  (TYLENOL ) 500 MG tablet Take 500-1,000 mg by mouth See admin instructions. Take 1000 mg by mouth in the morning and 500 mg at bedtime    [provider]  AMBULATORY NON FORMULARY MEDICATION Please provide 3 in 1 bedside commode.  Dx:R29.6, M62.81 04/16/22   Alvia Bring, DO  AMBULATORY NON FORMULARY MEDICATION Please provide bilateral thigh high compression stockings: Graded compression of 20-78mmhg 09/05/22   Alvia Bring, DO  amitriptyline  (ELAVIL ) 25 MG tablet Take 1 tab po qhs 03/13/23   Alvia Bring, DO  apixaban  (ELIQUIS ) 5 MG TABS tablet Take 1 tablet (5 mg total) by mouth 2 (two) times daily. 08/09/22   Vannie Reche RAMAN, NP  baclofen  (LIORESAL ) 20 MG tablet TAKE 1 TABLET BY MOUTH TWICE A DAY 01/30/22   Alvia Bring, DO  bisacodyl  (DULCOLAX) 10 MG suppository Place 1 suppository (10 mg total) rectally as needed for moderate constipation. 08/20/23   Francesca Elsie CROME, MD  calcium  carbonate (OS-CAL) 600 MG TABS tablet Take 600 mg by mouth daily.    [provider]  Calcium  Carbonate Antacid (TUMS PO) Take 3 tablets by mouth 2 (two) times daily as needed (heartburn).    [provider]  EPINEPHrine  0.3 mg/0.3 mL IJ SOAJ injection Inject 0.3 mg into the muscle as needed  for anaphylaxis. 12/26/23   Tegeler, Lonni PARAS, MD  Evolocumab  (REPATHA  SURECLICK) 140 MG/ML SOAJ Inject 140 mg into the skin every 14 (fourteen) days. Patient not taking: Reported on 10/09/2023 06/25/23   Vannie Reche RAMAN, NP  Evolocumab  (REPATHA  SURECLICK) 140 MG/ML SOAJ Inject 140 mg into the skin every 14 (fourteen) days. 10/09/23   Daneen Damien BROCKS, NP  folic acid  (FOLVITE ) 1 MG tablet Take 2 mg by mouth daily.    [provider]  insulin  glargine (LANTUS ) 100 UNIT/ML injection Inject 0.38 mLs (38 Units total) into the skin daily. 09/15/22   Cheryle Page, MD  insulin  glargine-yfgn (SEMGLEE ) 100 UNIT/ML Pen Inject into the skin. 01/17/23   [provider]  lactulose  (CHRONULAC ) 10 GM/15ML solution Take 15 mLs (10 g total) by mouth 3 (three) times daily. 08/20/23   Francesca Elsie CROME, MD  latanoprost  (XALATAN ) 0.005 % ophthalmic solution 1 drop at bedtime. 08/17/22  [provider]  Levothyroxine  Sodium 75 MCG CAPS Take 75 mcg by mouth daily.    [provider]  metFORMIN (GLUCOPHAGE-XR) 500 MG 24 hr tablet Take by mouth. 09/25/22   [provider]  methotrexate  (RHEUMATREX) 5 MG tablet Take 3 tablets by mouth once a week. Caution: Chemotherapy. Protect from light.    [provider]  metoprolol  succinate (TOPROL  XL) 25 MG 24 hr tablet Take 0.5 tablets (12.5 mg total) by mouth daily. 11/20/22   Vannie Reche RAMAN, NP  mirabegron  ER (MYRBETRIQ ) 50 MG TB24 tablet Take 1 tablet (50 mg total) by mouth daily. 03/13/23   Alvia Bring, DO  Multiple Vitamins-Minerals (PRESERVISION AREDS 2) CAPS Take 2 caps po daily 12/05/22   Alvia Bring, DO  pantoprazole  (PROTONIX ) 40 MG tablet TAKE 1 TABLET BY MOUTH EVERY DAY 05/17/23   Alvia Bring, DO  polyethylene glycol powder (MIRALAX ) 17 GM/SCOOP powder Take 17 g by mouth in the morning, at noon, and at bedtime. Take TID until having regular bowel movement, then QD 08/20/23   Francesca Elsie CROME, MD  polyvinyl  alcohol  (LIQUIFILM TEARS) 1.4 % ophthalmic solution Place 1 drop into both eyes in the morning and at bedtime.    [provider]  predniSONE  (DELTASONE ) 10 MG tablet Take 40 mg by mouth daily with breakfast. Take with 5 mg tablet to equal 15 mg    [provider]  QUEtiapine  (SEROQUEL ) 50 MG tablet Take by mouth. 09/21/22   [provider]  Semaglutide,0.25 or 0.5MG /DOS, 2 MG/3ML SOPN Inject 0.25 mLs into the skin once a week. 01/17/23   [provider]  sertraline  (ZOLOFT ) 25 MG tablet Take 2 tablets (50 mg total) by mouth daily. Take 50mg  daily. 09/15/22   Cheryle Page, MD  tamsulosin (FLOMAX) 0.4 MG CAPS capsule Take by mouth. 09/21/22   [provider]  torsemide  (DEMADEX ) 20 MG tablet Take 1 tablet (20 mg total) by mouth daily. May take additional tablet as directed by lymphedema clinic or cardiologist. 11/11/23   Daneen Damien BROCKS, NP  triamcinolone  (KENALOG ) 0.1 % paste Apply topically. Patient not taking: Reported on 10/09/2023 09/21/22   [provider]  triamcinolone  cream (KENALOG ) 0.1 % Apply 1 Application topically 2 (two) times daily. Apply bid as needed for rash Patient not taking: Reported on 10/09/2023 12/05/22   Alvia Bring, DO    Family History Family History  Problem Relation Age of Onset   Hypertension Mother    Diabetes Mother    Arthritis Mother    Depression Mother    Heart attack Father    Hypertension Father     Social History Social History   Tobacco Use   Smoking status: Never   Smokeless tobacco: Never  Vaping Use   Vaping status: Never Used  Substance Use Topics   Alcohol  use: No   Drug use: No     Allergies   Semaglutide, Benazepril, Gabapentin , Hydrocodone, and Shellfish allergy   Review of Systems Review of Systems See HPI  Physical Exam Triage Vital Signs ED Triage Vitals  Encounter Vitals Group     BP 01/02/24 1432 (!) 100/58     Girls Systolic BP Percentile --      Girls Diastolic BP  Percentile --      Boys Systolic BP Percentile --      Boys Diastolic BP Percentile --      Pulse Rate 01/02/24 1432 (!) 123     Resp 01/02/24 1432 18  Temp 01/02/24 1432 97.6 F (36.4 C)     Temp src --      SpO2 01/02/24 1432 95 %     Weight --      Height --      Head Circumference --      Peak Flow --      Pain Score 01/02/24 1437 0     Pain Loc --      Pain Education --      Exclude from Growth Chart --    No data found.  Updated Vital Signs BP (!) 100/58   Pulse (!) 123   Temp 97.6 F (36.4 C)   Resp 18   SpO2 95%      Physical Exam Constitutional:      General: He is not in acute distress.    Appearance: He is well-developed.     Comments: Obese.  Using walker for ambulation.  Obviously uncomfortable and itchy  HENT:     Head: Normocephalic and atraumatic.  Eyes:     Conjunctiva/sclera: Conjunctivae normal.     Pupils: Pupils are equal, round, and reactive to light.  Cardiovascular:     Rate and Rhythm: Normal rate.  Pulmonary:     Effort: Pulmonary effort is normal. No respiratory distress.     Breath sounds: Normal breath sounds.  Abdominal:     General: There is distension.  Musculoskeletal:        General: Normal range of motion.     Cervical back: Normal range of motion.  Skin:    General: Skin is warm and dry.     Findings: No rash.     Comments: Maculopapular rash is large on back and buttocks with a small amount on right shoulder and right anterior chest  Neurological:     Mental Status: He is alert.     Gait: Gait abnormal.      UC Treatments / Results  Labs (all labs ordered are listed, but only abnormal results are displayed) Labs Reviewed - No data to display  EKG   Radiology No results found.  Procedures Procedures (including critical care time)  Medications Ordered in UC Medications  cetirizine  (ZYRTEC ) tablet 20 mg (has no administration in time range)  methylPREDNISolone  acetate (DEPO-MEDROL ) injection 80 mg (80 mg  Intramuscular Given 01/02/24 1512)    Initial Impression / Assessment and Plan / UC Course  I have reviewed the triage vital signs and the nursing notes.  Pertinent labs & imaging results that were available during my care of the patient were reviewed by me and considered in my medical decision making (see chart for details).     Will treat with antihistamines.  He is given a shot of steroids.  Follow-up with PCP. Final Clinical Impressions(s) / UC Diagnoses   Final diagnoses:  Allergic reaction, subsequent encounter  Urticaria     Discharge Instructions      Antihistamines or other medicines that will help best with the itch.  The nondrowsy  antihistamine cetirizine  (Zyrtec ) is usually taken 1 a day.  For skin rash you take 2 a day.  May increase to 3 a day if needed The other antihistamine I am prescribing is hydroxyzine  (Atarax ).  This is not listed as a drug allergy.  He may take 1 of these pills 3 times a day.  If 1 does not help may increase to 2 pills.  This medicine might cause drowsiness He has been given a shot of a steroid  medication Called Dr. Alvia if you fail to improve   ED Prescriptions     Medication Sig Dispense Auth. Provider   hydrOXYzine  (ATARAX ) 25 MG tablet Take 1 tablet (25 mg total) by mouth every 6 (six) hours as needed. 20 tablet Maranda Jamee Jacob, MD   cetirizine  (ZYRTEC ) 10 MG tablet Take 1 tablet (10 mg total) by mouth daily. 30 tablet Maranda Jamee Jacob, MD      PDMP not reviewed this encounter.   Maranda Jamee Jacob, MD 01/02/24 [phone]

## 2024-01-02 NOTE — Discharge Instructions (Signed)
 Antihistamines or other medicines that will help best with the itch.  The nondrowsy  antihistamine cetirizine  (Zyrtec ) is usually taken 1 a day.  For skin rash you take 2 a day.  May increase to 3 a day if needed The other antihistamine I am prescribing is hydroxyzine  (Atarax ).  This is not listed as a drug allergy.  He may take 1 of these pills 3 times a day.  If 1 does not help may increase to 2 pills.  This medicine might cause drowsiness He has been given a shot of a steroid medication Called Dr. Alvia if you fail to improve

## 2024-01-02 NOTE — ED Triage Notes (Addendum)
 Has c/o hives to back, arms. Thinks it is due to an antibiotic, unsure of name but is no longer taking. Has been seen for same on 12/31/23. Also went to ER. What he was given has not been helping - was given prednisone  and benadryl . Was given prednisone  40 mg for 5 days. Has been using benadryl .

## 2024-01-03 DIAGNOSIS — E1169 Type 2 diabetes mellitus with other specified complication: Secondary | ICD-10-CM | POA: Diagnosis not present

## 2024-01-03 DIAGNOSIS — E11621 Type 2 diabetes mellitus with foot ulcer: Secondary | ICD-10-CM | POA: Diagnosis not present

## 2024-01-03 DIAGNOSIS — L97522 Non-pressure chronic ulcer of other part of left foot with fat layer exposed: Secondary | ICD-10-CM | POA: Diagnosis not present

## 2024-01-03 DIAGNOSIS — M868X7 Other osteomyelitis, ankle and foot: Secondary | ICD-10-CM | POA: Diagnosis not present

## 2024-01-03 DIAGNOSIS — B9561 Methicillin susceptible Staphylococcus aureus infection as the cause of diseases classified elsewhere: Secondary | ICD-10-CM | POA: Diagnosis not present

## 2024-01-03 DIAGNOSIS — T8131XD Disruption of external operation (surgical) wound, not elsewhere classified, subsequent encounter: Secondary | ICD-10-CM | POA: Diagnosis not present

## 2024-01-06 DIAGNOSIS — Z4889 Encounter for other specified surgical aftercare: Secondary | ICD-10-CM | POA: Diagnosis not present

## 2024-01-06 DIAGNOSIS — T3695XA Adverse effect of unspecified systemic antibiotic, initial encounter: Secondary | ICD-10-CM | POA: Diagnosis not present

## 2024-01-09 ENCOUNTER — Telehealth: Payer: Self-pay

## 2024-01-09 NOTE — Telephone Encounter (Signed)
 Left message for patient to call back and schedule a follow up appointment.   Due for eye exam  Due for colonoscopy

## 2024-01-10 DIAGNOSIS — B9561 Methicillin susceptible Staphylococcus aureus infection as the cause of diseases classified elsewhere: Secondary | ICD-10-CM | POA: Diagnosis not present

## 2024-01-10 DIAGNOSIS — M868X7 Other osteomyelitis, ankle and foot: Secondary | ICD-10-CM | POA: Diagnosis not present

## 2024-01-10 DIAGNOSIS — L97522 Non-pressure chronic ulcer of other part of left foot with fat layer exposed: Secondary | ICD-10-CM | POA: Diagnosis not present

## 2024-01-10 DIAGNOSIS — T8131XD Disruption of external operation (surgical) wound, not elsewhere classified, subsequent encounter: Secondary | ICD-10-CM | POA: Diagnosis not present

## 2024-01-10 DIAGNOSIS — E1169 Type 2 diabetes mellitus with other specified complication: Secondary | ICD-10-CM | POA: Diagnosis not present

## 2024-01-10 DIAGNOSIS — E11621 Type 2 diabetes mellitus with foot ulcer: Secondary | ICD-10-CM | POA: Diagnosis not present

## 2024-01-14 DIAGNOSIS — Z86711 Personal history of pulmonary embolism: Secondary | ICD-10-CM | POA: Diagnosis not present

## 2024-01-14 DIAGNOSIS — G729 Myopathy, unspecified: Secondary | ICD-10-CM | POA: Diagnosis not present

## 2024-01-14 DIAGNOSIS — E11621 Type 2 diabetes mellitus with foot ulcer: Secondary | ICD-10-CM | POA: Diagnosis not present

## 2024-01-14 DIAGNOSIS — A4901 Methicillin susceptible Staphylococcus aureus infection, unspecified site: Secondary | ICD-10-CM | POA: Diagnosis not present

## 2024-01-14 DIAGNOSIS — I890 Lymphedema, not elsewhere classified: Secondary | ICD-10-CM | POA: Diagnosis not present

## 2024-01-14 DIAGNOSIS — Z7901 Long term (current) use of anticoagulants: Secondary | ICD-10-CM | POA: Diagnosis not present

## 2024-01-14 DIAGNOSIS — Z7984 Long term (current) use of oral hypoglycemic drugs: Secondary | ICD-10-CM | POA: Diagnosis not present

## 2024-01-14 DIAGNOSIS — L97426 Non-pressure chronic ulcer of left heel and midfoot with bone involvement without evidence of necrosis: Secondary | ICD-10-CM | POA: Diagnosis not present

## 2024-01-14 DIAGNOSIS — L97512 Non-pressure chronic ulcer of other part of right foot with fat layer exposed: Secondary | ICD-10-CM | POA: Diagnosis not present

## 2024-01-14 DIAGNOSIS — Z79631 Long term (current) use of antimetabolite agent: Secondary | ICD-10-CM | POA: Diagnosis not present

## 2024-01-14 DIAGNOSIS — Z794 Long term (current) use of insulin: Secondary | ICD-10-CM | POA: Diagnosis not present

## 2024-01-14 DIAGNOSIS — E11628 Type 2 diabetes mellitus with other skin complications: Secondary | ICD-10-CM | POA: Diagnosis not present

## 2024-01-14 DIAGNOSIS — L97526 Non-pressure chronic ulcer of other part of left foot with bone involvement without evidence of necrosis: Secondary | ICD-10-CM | POA: Diagnosis not present

## 2024-01-16 DIAGNOSIS — G7241 Inclusion body myositis [IBM]: Secondary | ICD-10-CM | POA: Diagnosis not present

## 2024-01-16 DIAGNOSIS — L97522 Non-pressure chronic ulcer of other part of left foot with fat layer exposed: Secondary | ICD-10-CM | POA: Diagnosis not present

## 2024-01-16 DIAGNOSIS — Z89422 Acquired absence of other left toe(s): Secondary | ICD-10-CM | POA: Diagnosis not present

## 2024-01-16 DIAGNOSIS — F4310 Post-traumatic stress disorder, unspecified: Secondary | ICD-10-CM | POA: Diagnosis not present

## 2024-01-16 DIAGNOSIS — E11621 Type 2 diabetes mellitus with foot ulcer: Secondary | ICD-10-CM | POA: Diagnosis not present

## 2024-01-16 DIAGNOSIS — I509 Heart failure, unspecified: Secondary | ICD-10-CM | POA: Diagnosis not present

## 2024-01-16 DIAGNOSIS — E1122 Type 2 diabetes mellitus with diabetic chronic kidney disease: Secondary | ICD-10-CM | POA: Diagnosis not present

## 2024-01-16 DIAGNOSIS — Z86711 Personal history of pulmonary embolism: Secondary | ICD-10-CM | POA: Diagnosis not present

## 2024-01-16 DIAGNOSIS — N183 Chronic kidney disease, stage 3 unspecified: Secondary | ICD-10-CM | POA: Diagnosis not present

## 2024-01-16 DIAGNOSIS — Z794 Long term (current) use of insulin: Secondary | ICD-10-CM | POA: Diagnosis not present

## 2024-01-16 DIAGNOSIS — N529 Male erectile dysfunction, unspecified: Secondary | ICD-10-CM | POA: Diagnosis not present

## 2024-01-16 DIAGNOSIS — E039 Hypothyroidism, unspecified: Secondary | ICD-10-CM | POA: Diagnosis not present

## 2024-01-16 DIAGNOSIS — E1151 Type 2 diabetes mellitus with diabetic peripheral angiopathy without gangrene: Secondary | ICD-10-CM | POA: Diagnosis not present

## 2024-01-16 DIAGNOSIS — E11319 Type 2 diabetes mellitus with unspecified diabetic retinopathy without macular edema: Secondary | ICD-10-CM | POA: Diagnosis not present

## 2024-01-16 DIAGNOSIS — Z7901 Long term (current) use of anticoagulants: Secondary | ICD-10-CM | POA: Diagnosis not present

## 2024-01-16 DIAGNOSIS — M868X7 Other osteomyelitis, ankle and foot: Secondary | ICD-10-CM | POA: Diagnosis not present

## 2024-01-16 DIAGNOSIS — G4730 Sleep apnea, unspecified: Secondary | ICD-10-CM | POA: Diagnosis not present

## 2024-01-16 DIAGNOSIS — H43813 Vitreous degeneration, bilateral: Secondary | ICD-10-CM | POA: Diagnosis not present

## 2024-01-16 DIAGNOSIS — I130 Hypertensive heart and chronic kidney disease with heart failure and stage 1 through stage 4 chronic kidney disease, or unspecified chronic kidney disease: Secondary | ICD-10-CM | POA: Diagnosis not present

## 2024-01-16 DIAGNOSIS — E1142 Type 2 diabetes mellitus with diabetic polyneuropathy: Secondary | ICD-10-CM | POA: Diagnosis not present

## 2024-01-16 DIAGNOSIS — E1169 Type 2 diabetes mellitus with other specified complication: Secondary | ICD-10-CM | POA: Diagnosis not present

## 2024-01-16 DIAGNOSIS — B9561 Methicillin susceptible Staphylococcus aureus infection as the cause of diseases classified elsewhere: Secondary | ICD-10-CM | POA: Diagnosis not present

## 2024-01-16 DIAGNOSIS — E785 Hyperlipidemia, unspecified: Secondary | ICD-10-CM | POA: Diagnosis not present

## 2024-01-16 DIAGNOSIS — T8131XD Disruption of external operation (surgical) wound, not elsewhere classified, subsequent encounter: Secondary | ICD-10-CM | POA: Diagnosis not present

## 2024-01-16 DIAGNOSIS — E669 Obesity, unspecified: Secondary | ICD-10-CM | POA: Diagnosis not present

## 2024-01-17 DIAGNOSIS — B9561 Methicillin susceptible Staphylococcus aureus infection as the cause of diseases classified elsewhere: Secondary | ICD-10-CM | POA: Diagnosis not present

## 2024-01-17 DIAGNOSIS — L97522 Non-pressure chronic ulcer of other part of left foot with fat layer exposed: Secondary | ICD-10-CM | POA: Diagnosis not present

## 2024-01-17 DIAGNOSIS — M868X7 Other osteomyelitis, ankle and foot: Secondary | ICD-10-CM | POA: Diagnosis not present

## 2024-01-17 DIAGNOSIS — E11621 Type 2 diabetes mellitus with foot ulcer: Secondary | ICD-10-CM | POA: Diagnosis not present

## 2024-01-17 DIAGNOSIS — E1169 Type 2 diabetes mellitus with other specified complication: Secondary | ICD-10-CM | POA: Diagnosis not present

## 2024-01-17 DIAGNOSIS — T8131XD Disruption of external operation (surgical) wound, not elsewhere classified, subsequent encounter: Secondary | ICD-10-CM | POA: Diagnosis not present

## 2024-01-18 DIAGNOSIS — R109 Unspecified abdominal pain: Secondary | ICD-10-CM | POA: Diagnosis not present

## 2024-01-18 DIAGNOSIS — Z5329 Procedure and treatment not carried out because of patient's decision for other reasons: Secondary | ICD-10-CM | POA: Diagnosis not present

## 2024-01-20 DIAGNOSIS — L97522 Non-pressure chronic ulcer of other part of left foot with fat layer exposed: Secondary | ICD-10-CM | POA: Diagnosis not present

## 2024-01-23 ENCOUNTER — Encounter (HOSPITAL_BASED_OUTPATIENT_CLINIC_OR_DEPARTMENT_OTHER): Attending: Internal Medicine | Admitting: Internal Medicine

## 2024-01-23 DIAGNOSIS — I890 Lymphedema, not elsewhere classified: Secondary | ICD-10-CM | POA: Diagnosis not present

## 2024-01-23 DIAGNOSIS — L97528 Non-pressure chronic ulcer of other part of left foot with other specified severity: Secondary | ICD-10-CM | POA: Diagnosis not present

## 2024-01-23 DIAGNOSIS — E11621 Type 2 diabetes mellitus with foot ulcer: Secondary | ICD-10-CM | POA: Insufficient documentation

## 2024-01-27 ENCOUNTER — Other Ambulatory Visit (HOSPITAL_COMMUNITY): Payer: Self-pay | Admitting: Internal Medicine

## 2024-01-27 DIAGNOSIS — E11621 Type 2 diabetes mellitus with foot ulcer: Secondary | ICD-10-CM | POA: Diagnosis not present

## 2024-01-27 DIAGNOSIS — M868X7 Other osteomyelitis, ankle and foot: Secondary | ICD-10-CM | POA: Diagnosis not present

## 2024-01-27 DIAGNOSIS — T8131XD Disruption of external operation (surgical) wound, not elsewhere classified, subsequent encounter: Secondary | ICD-10-CM | POA: Diagnosis not present

## 2024-01-27 DIAGNOSIS — M869 Osteomyelitis, unspecified: Secondary | ICD-10-CM

## 2024-01-27 DIAGNOSIS — L97522 Non-pressure chronic ulcer of other part of left foot with fat layer exposed: Secondary | ICD-10-CM | POA: Diagnosis not present

## 2024-01-27 DIAGNOSIS — E1169 Type 2 diabetes mellitus with other specified complication: Secondary | ICD-10-CM | POA: Diagnosis not present

## 2024-01-27 DIAGNOSIS — B9561 Methicillin susceptible Staphylococcus aureus infection as the cause of diseases classified elsewhere: Secondary | ICD-10-CM | POA: Diagnosis not present

## 2024-01-30 ENCOUNTER — Encounter (HOSPITAL_BASED_OUTPATIENT_CLINIC_OR_DEPARTMENT_OTHER): Attending: Internal Medicine | Admitting: Internal Medicine

## 2024-01-30 DIAGNOSIS — L97528 Non-pressure chronic ulcer of other part of left foot with other specified severity: Secondary | ICD-10-CM | POA: Insufficient documentation

## 2024-01-30 DIAGNOSIS — I890 Lymphedema, not elsewhere classified: Secondary | ICD-10-CM | POA: Diagnosis not present

## 2024-01-30 DIAGNOSIS — E11621 Type 2 diabetes mellitus with foot ulcer: Secondary | ICD-10-CM | POA: Insufficient documentation

## 2024-01-31 DIAGNOSIS — M868X7 Other osteomyelitis, ankle and foot: Secondary | ICD-10-CM | POA: Diagnosis not present

## 2024-01-31 DIAGNOSIS — T8131XD Disruption of external operation (surgical) wound, not elsewhere classified, subsequent encounter: Secondary | ICD-10-CM | POA: Diagnosis not present

## 2024-01-31 DIAGNOSIS — E11621 Type 2 diabetes mellitus with foot ulcer: Secondary | ICD-10-CM | POA: Diagnosis not present

## 2024-01-31 DIAGNOSIS — L97522 Non-pressure chronic ulcer of other part of left foot with fat layer exposed: Secondary | ICD-10-CM | POA: Diagnosis not present

## 2024-01-31 DIAGNOSIS — B9561 Methicillin susceptible Staphylococcus aureus infection as the cause of diseases classified elsewhere: Secondary | ICD-10-CM | POA: Diagnosis not present

## 2024-01-31 DIAGNOSIS — E1169 Type 2 diabetes mellitus with other specified complication: Secondary | ICD-10-CM | POA: Diagnosis not present

## 2024-02-01 ENCOUNTER — Ambulatory Visit (HOSPITAL_COMMUNITY)
Admission: RE | Admit: 2024-02-01 | Discharge: 2024-02-01 | Disposition: A | Discharge status: Home / Self Care | Source: Ambulatory Visit | Attending: Internal Medicine | Admitting: Internal Medicine

## 2024-02-01 DIAGNOSIS — E1169 Type 2 diabetes mellitus with other specified complication: Secondary | ICD-10-CM | POA: Diagnosis not present

## 2024-02-01 DIAGNOSIS — L97509 Non-pressure chronic ulcer of other part of unspecified foot with unspecified severity: Secondary | ICD-10-CM | POA: Diagnosis not present

## 2024-02-01 DIAGNOSIS — M2012 Hallux valgus (acquired), left foot: Secondary | ICD-10-CM | POA: Diagnosis not present

## 2024-02-01 DIAGNOSIS — M19072 Primary osteoarthritis, left ankle and foot: Secondary | ICD-10-CM | POA: Diagnosis not present

## 2024-02-01 DIAGNOSIS — M869 Osteomyelitis, unspecified: Secondary | ICD-10-CM | POA: Diagnosis not present

## 2024-02-01 DIAGNOSIS — E11621 Type 2 diabetes mellitus with foot ulcer: Secondary | ICD-10-CM | POA: Insufficient documentation

## 2024-02-04 DIAGNOSIS — L97522 Non-pressure chronic ulcer of other part of left foot with fat layer exposed: Secondary | ICD-10-CM | POA: Diagnosis not present

## 2024-02-04 DIAGNOSIS — B9561 Methicillin susceptible Staphylococcus aureus infection as the cause of diseases classified elsewhere: Secondary | ICD-10-CM | POA: Diagnosis not present

## 2024-02-04 DIAGNOSIS — E1169 Type 2 diabetes mellitus with other specified complication: Secondary | ICD-10-CM | POA: Diagnosis not present

## 2024-02-04 DIAGNOSIS — E11621 Type 2 diabetes mellitus with foot ulcer: Secondary | ICD-10-CM | POA: Diagnosis not present

## 2024-02-04 DIAGNOSIS — M868X7 Other osteomyelitis, ankle and foot: Secondary | ICD-10-CM | POA: Diagnosis not present

## 2024-02-04 DIAGNOSIS — T8131XD Disruption of external operation (surgical) wound, not elsewhere classified, subsequent encounter: Secondary | ICD-10-CM | POA: Diagnosis not present

## 2024-02-06 ENCOUNTER — Encounter (HOSPITAL_BASED_OUTPATIENT_CLINIC_OR_DEPARTMENT_OTHER): Admitting: Internal Medicine

## 2024-02-06 ENCOUNTER — Ambulatory Visit (HOSPITAL_BASED_OUTPATIENT_CLINIC_OR_DEPARTMENT_OTHER): Admitting: Internal Medicine

## 2024-02-06 DIAGNOSIS — L97528 Non-pressure chronic ulcer of other part of left foot with other specified severity: Secondary | ICD-10-CM

## 2024-02-06 DIAGNOSIS — I890 Lymphedema, not elsewhere classified: Secondary | ICD-10-CM

## 2024-02-06 DIAGNOSIS — E11621 Type 2 diabetes mellitus with foot ulcer: Secondary | ICD-10-CM | POA: Diagnosis not present

## 2024-02-07 DIAGNOSIS — L97522 Non-pressure chronic ulcer of other part of left foot with fat layer exposed: Secondary | ICD-10-CM | POA: Diagnosis not present

## 2024-02-10 DIAGNOSIS — B9561 Methicillin susceptible Staphylococcus aureus infection as the cause of diseases classified elsewhere: Secondary | ICD-10-CM | POA: Diagnosis not present

## 2024-02-10 DIAGNOSIS — T8131XD Disruption of external operation (surgical) wound, not elsewhere classified, subsequent encounter: Secondary | ICD-10-CM | POA: Diagnosis not present

## 2024-02-10 DIAGNOSIS — L97522 Non-pressure chronic ulcer of other part of left foot with fat layer exposed: Secondary | ICD-10-CM | POA: Diagnosis not present

## 2024-02-10 DIAGNOSIS — E11621 Type 2 diabetes mellitus with foot ulcer: Secondary | ICD-10-CM | POA: Diagnosis not present

## 2024-02-10 DIAGNOSIS — E1169 Type 2 diabetes mellitus with other specified complication: Secondary | ICD-10-CM | POA: Diagnosis not present

## 2024-02-10 DIAGNOSIS — M868X7 Other osteomyelitis, ankle and foot: Secondary | ICD-10-CM | POA: Diagnosis not present

## 2024-02-11 ENCOUNTER — Encounter (HOSPITAL_BASED_OUTPATIENT_CLINIC_OR_DEPARTMENT_OTHER): Admitting: Internal Medicine

## 2024-02-11 DIAGNOSIS — L97528 Non-pressure chronic ulcer of other part of left foot with other specified severity: Secondary | ICD-10-CM | POA: Diagnosis not present

## 2024-02-11 DIAGNOSIS — I890 Lymphedema, not elsewhere classified: Secondary | ICD-10-CM | POA: Diagnosis not present

## 2024-02-11 DIAGNOSIS — E11621 Type 2 diabetes mellitus with foot ulcer: Secondary | ICD-10-CM | POA: Diagnosis not present

## 2024-02-12 DIAGNOSIS — L308 Other specified dermatitis: Secondary | ICD-10-CM | POA: Diagnosis not present

## 2024-02-12 DIAGNOSIS — R21 Rash and other nonspecific skin eruption: Secondary | ICD-10-CM | POA: Diagnosis not present

## 2024-02-12 DIAGNOSIS — L304 Erythema intertrigo: Secondary | ICD-10-CM | POA: Diagnosis not present

## 2024-02-12 DIAGNOSIS — L820 Inflamed seborrheic keratosis: Secondary | ICD-10-CM | POA: Diagnosis not present

## 2024-02-12 DIAGNOSIS — L739 Follicular disorder, unspecified: Secondary | ICD-10-CM | POA: Diagnosis not present

## 2024-02-12 DIAGNOSIS — L821 Other seborrheic keratosis: Secondary | ICD-10-CM | POA: Diagnosis not present

## 2024-02-15 DIAGNOSIS — G4730 Sleep apnea, unspecified: Secondary | ICD-10-CM | POA: Diagnosis not present

## 2024-02-15 DIAGNOSIS — Z89422 Acquired absence of other left toe(s): Secondary | ICD-10-CM | POA: Diagnosis not present

## 2024-02-15 DIAGNOSIS — G7241 Inclusion body myositis [IBM]: Secondary | ICD-10-CM | POA: Diagnosis not present

## 2024-02-15 DIAGNOSIS — B9561 Methicillin susceptible Staphylococcus aureus infection as the cause of diseases classified elsewhere: Secondary | ICD-10-CM | POA: Diagnosis not present

## 2024-02-15 DIAGNOSIS — N529 Male erectile dysfunction, unspecified: Secondary | ICD-10-CM | POA: Diagnosis not present

## 2024-02-15 DIAGNOSIS — E11319 Type 2 diabetes mellitus with unspecified diabetic retinopathy without macular edema: Secondary | ICD-10-CM | POA: Diagnosis not present

## 2024-02-15 DIAGNOSIS — E11621 Type 2 diabetes mellitus with foot ulcer: Secondary | ICD-10-CM | POA: Diagnosis not present

## 2024-02-15 DIAGNOSIS — Z7901 Long term (current) use of anticoagulants: Secondary | ICD-10-CM | POA: Diagnosis not present

## 2024-02-15 DIAGNOSIS — E1122 Type 2 diabetes mellitus with diabetic chronic kidney disease: Secondary | ICD-10-CM | POA: Diagnosis not present

## 2024-02-15 DIAGNOSIS — N183 Chronic kidney disease, stage 3 unspecified: Secondary | ICD-10-CM | POA: Diagnosis not present

## 2024-02-15 DIAGNOSIS — E785 Hyperlipidemia, unspecified: Secondary | ICD-10-CM | POA: Diagnosis not present

## 2024-02-15 DIAGNOSIS — E039 Hypothyroidism, unspecified: Secondary | ICD-10-CM | POA: Diagnosis not present

## 2024-02-15 DIAGNOSIS — E1142 Type 2 diabetes mellitus with diabetic polyneuropathy: Secondary | ICD-10-CM | POA: Diagnosis not present

## 2024-02-15 DIAGNOSIS — E669 Obesity, unspecified: Secondary | ICD-10-CM | POA: Diagnosis not present

## 2024-02-15 DIAGNOSIS — I130 Hypertensive heart and chronic kidney disease with heart failure and stage 1 through stage 4 chronic kidney disease, or unspecified chronic kidney disease: Secondary | ICD-10-CM | POA: Diagnosis not present

## 2024-02-15 DIAGNOSIS — I509 Heart failure, unspecified: Secondary | ICD-10-CM | POA: Diagnosis not present

## 2024-02-15 DIAGNOSIS — F4310 Post-traumatic stress disorder, unspecified: Secondary | ICD-10-CM | POA: Diagnosis not present

## 2024-02-15 DIAGNOSIS — T8131XD Disruption of external operation (surgical) wound, not elsewhere classified, subsequent encounter: Secondary | ICD-10-CM | POA: Diagnosis not present

## 2024-02-15 DIAGNOSIS — E1169 Type 2 diabetes mellitus with other specified complication: Secondary | ICD-10-CM | POA: Diagnosis not present

## 2024-02-15 DIAGNOSIS — L97522 Non-pressure chronic ulcer of other part of left foot with fat layer exposed: Secondary | ICD-10-CM | POA: Diagnosis not present

## 2024-02-15 DIAGNOSIS — M868X7 Other osteomyelitis, ankle and foot: Secondary | ICD-10-CM | POA: Diagnosis not present

## 2024-02-15 DIAGNOSIS — H43813 Vitreous degeneration, bilateral: Secondary | ICD-10-CM | POA: Diagnosis not present

## 2024-02-15 DIAGNOSIS — Z794 Long term (current) use of insulin: Secondary | ICD-10-CM | POA: Diagnosis not present

## 2024-02-15 DIAGNOSIS — E1151 Type 2 diabetes mellitus with diabetic peripheral angiopathy without gangrene: Secondary | ICD-10-CM | POA: Diagnosis not present

## 2024-02-15 DIAGNOSIS — Z86711 Personal history of pulmonary embolism: Secondary | ICD-10-CM | POA: Diagnosis not present

## 2024-02-17 ENCOUNTER — Encounter (HOSPITAL_BASED_OUTPATIENT_CLINIC_OR_DEPARTMENT_OTHER): Admitting: Internal Medicine

## 2024-02-17 DIAGNOSIS — I890 Lymphedema, not elsewhere classified: Secondary | ICD-10-CM

## 2024-02-17 DIAGNOSIS — E11621 Type 2 diabetes mellitus with foot ulcer: Secondary | ICD-10-CM

## 2024-02-17 DIAGNOSIS — L97528 Non-pressure chronic ulcer of other part of left foot with other specified severity: Secondary | ICD-10-CM

## 2024-02-18 ENCOUNTER — Encounter (HOSPITAL_BASED_OUTPATIENT_CLINIC_OR_DEPARTMENT_OTHER): Admitting: Internal Medicine

## 2024-02-20 ENCOUNTER — Encounter (HOSPITAL_BASED_OUTPATIENT_CLINIC_OR_DEPARTMENT_OTHER): Payer: Self-pay

## 2024-02-20 ENCOUNTER — Other Ambulatory Visit (HOSPITAL_BASED_OUTPATIENT_CLINIC_OR_DEPARTMENT_OTHER): Payer: Self-pay | Admitting: Internal Medicine

## 2024-02-20 DIAGNOSIS — L97528 Non-pressure chronic ulcer of other part of left foot with other specified severity: Secondary | ICD-10-CM

## 2024-02-20 DIAGNOSIS — Z01818 Encounter for other preprocedural examination: Secondary | ICD-10-CM

## 2024-02-20 DIAGNOSIS — E11621 Type 2 diabetes mellitus with foot ulcer: Secondary | ICD-10-CM

## 2024-02-20 DIAGNOSIS — Z9189 Other specified personal risk factors, not elsewhere classified: Secondary | ICD-10-CM

## 2024-02-20 DIAGNOSIS — L97522 Non-pressure chronic ulcer of other part of left foot with fat layer exposed: Secondary | ICD-10-CM | POA: Diagnosis not present

## 2024-02-20 DIAGNOSIS — B9561 Methicillin susceptible Staphylococcus aureus infection as the cause of diseases classified elsewhere: Secondary | ICD-10-CM | POA: Diagnosis not present

## 2024-02-20 DIAGNOSIS — E1169 Type 2 diabetes mellitus with other specified complication: Secondary | ICD-10-CM | POA: Diagnosis not present

## 2024-02-20 DIAGNOSIS — T8131XD Disruption of external operation (surgical) wound, not elsewhere classified, subsequent encounter: Secondary | ICD-10-CM | POA: Diagnosis not present

## 2024-02-20 DIAGNOSIS — M868X7 Other osteomyelitis, ankle and foot: Secondary | ICD-10-CM | POA: Diagnosis not present

## 2024-02-24 ENCOUNTER — Ambulatory Visit: Admitting: Internal Medicine

## 2024-02-25 ENCOUNTER — Encounter (HOSPITAL_BASED_OUTPATIENT_CLINIC_OR_DEPARTMENT_OTHER): Admitting: Internal Medicine

## 2024-02-25 ENCOUNTER — Ambulatory Visit (HOSPITAL_COMMUNITY)
Admission: RE | Admit: 2024-02-25 | Discharge: 2024-02-25 | Disposition: A | Discharge status: Home / Self Care | Source: Ambulatory Visit | Attending: Internal Medicine | Admitting: Internal Medicine

## 2024-02-25 DIAGNOSIS — E11621 Type 2 diabetes mellitus with foot ulcer: Secondary | ICD-10-CM

## 2024-02-25 DIAGNOSIS — L97529 Non-pressure chronic ulcer of other part of left foot with unspecified severity: Secondary | ICD-10-CM | POA: Insufficient documentation

## 2024-02-25 DIAGNOSIS — Z01818 Encounter for other preprocedural examination: Secondary | ICD-10-CM | POA: Diagnosis not present

## 2024-02-25 DIAGNOSIS — L97528 Non-pressure chronic ulcer of other part of left foot with other specified severity: Secondary | ICD-10-CM

## 2024-02-25 DIAGNOSIS — I890 Lymphedema, not elsewhere classified: Secondary | ICD-10-CM

## 2024-02-25 DIAGNOSIS — Z0389 Encounter for observation for other suspected diseases and conditions ruled out: Secondary | ICD-10-CM | POA: Diagnosis not present

## 2024-02-28 DIAGNOSIS — M868X7 Other osteomyelitis, ankle and foot: Secondary | ICD-10-CM | POA: Diagnosis not present

## 2024-02-28 DIAGNOSIS — E1169 Type 2 diabetes mellitus with other specified complication: Secondary | ICD-10-CM | POA: Diagnosis not present

## 2024-02-28 DIAGNOSIS — L97522 Non-pressure chronic ulcer of other part of left foot with fat layer exposed: Secondary | ICD-10-CM | POA: Diagnosis not present

## 2024-02-28 DIAGNOSIS — E11621 Type 2 diabetes mellitus with foot ulcer: Secondary | ICD-10-CM | POA: Diagnosis not present

## 2024-02-28 DIAGNOSIS — B9561 Methicillin susceptible Staphylococcus aureus infection as the cause of diseases classified elsewhere: Secondary | ICD-10-CM | POA: Diagnosis not present

## 2024-02-28 DIAGNOSIS — T8131XD Disruption of external operation (surgical) wound, not elsewhere classified, subsequent encounter: Secondary | ICD-10-CM | POA: Diagnosis not present

## 2024-03-02 ENCOUNTER — Encounter (HOSPITAL_BASED_OUTPATIENT_CLINIC_OR_DEPARTMENT_OTHER): Admitting: Internal Medicine

## 2024-03-02 DIAGNOSIS — M868X7 Other osteomyelitis, ankle and foot: Secondary | ICD-10-CM | POA: Diagnosis not present

## 2024-03-02 DIAGNOSIS — E1169 Type 2 diabetes mellitus with other specified complication: Secondary | ICD-10-CM | POA: Diagnosis not present

## 2024-03-02 DIAGNOSIS — B9561 Methicillin susceptible Staphylococcus aureus infection as the cause of diseases classified elsewhere: Secondary | ICD-10-CM | POA: Diagnosis not present

## 2024-03-02 DIAGNOSIS — T8131XD Disruption of external operation (surgical) wound, not elsewhere classified, subsequent encounter: Secondary | ICD-10-CM | POA: Diagnosis not present

## 2024-03-02 DIAGNOSIS — E11621 Type 2 diabetes mellitus with foot ulcer: Secondary | ICD-10-CM | POA: Diagnosis not present

## 2024-03-02 DIAGNOSIS — L97522 Non-pressure chronic ulcer of other part of left foot with fat layer exposed: Secondary | ICD-10-CM | POA: Diagnosis not present

## 2024-03-03 ENCOUNTER — Encounter: Payer: Self-pay | Admitting: Internal Medicine

## 2024-03-03 ENCOUNTER — Other Ambulatory Visit: Payer: Self-pay

## 2024-03-03 ENCOUNTER — Encounter (HOSPITAL_BASED_OUTPATIENT_CLINIC_OR_DEPARTMENT_OTHER): Attending: Internal Medicine | Admitting: Internal Medicine

## 2024-03-03 ENCOUNTER — Ambulatory Visit: Admitting: Internal Medicine

## 2024-03-03 VITALS — BP 137/76 | HR 102 | Temp 97.7°F

## 2024-03-03 DIAGNOSIS — L97429 Non-pressure chronic ulcer of left heel and midfoot with unspecified severity: Secondary | ICD-10-CM | POA: Diagnosis not present

## 2024-03-03 DIAGNOSIS — I890 Lymphedema, not elsewhere classified: Secondary | ICD-10-CM | POA: Diagnosis not present

## 2024-03-03 DIAGNOSIS — Z881 Allergy status to other antibiotic agents status: Secondary | ICD-10-CM

## 2024-03-03 DIAGNOSIS — E11621 Type 2 diabetes mellitus with foot ulcer: Secondary | ICD-10-CM | POA: Diagnosis present

## 2024-03-03 DIAGNOSIS — L97528 Non-pressure chronic ulcer of other part of left foot with other specified severity: Secondary | ICD-10-CM | POA: Diagnosis not present

## 2024-03-03 DIAGNOSIS — L97529 Non-pressure chronic ulcer of other part of left foot with unspecified severity: Secondary | ICD-10-CM

## 2024-03-03 DIAGNOSIS — Z794 Long term (current) use of insulin: Secondary | ICD-10-CM | POA: Diagnosis not present

## 2024-03-03 DIAGNOSIS — D849 Immunodeficiency, unspecified: Secondary | ICD-10-CM | POA: Diagnosis not present

## 2024-03-03 LAB — GLUCOSE, CAPILLARY
Glucose-Capillary: 110 mg/dL — ABNORMAL HIGH (ref 70–99)
Glucose-Capillary: 100 mg/dL — ABNORMAL HIGH (ref 70–99)
Glucose-Capillary: 97 mg/dL (ref 70–99)
Glucose-Capillary: 126 mg/dL — ABNORMAL HIGH (ref 70–99)
Glucose-Capillary: 144 mg/dL — ABNORMAL HIGH (ref 70–99)

## 2024-03-03 NOTE — Patient Instructions (Signed)
 From ID standpoint and treatment of osteomyelitis standpoint (which he had 12/09/23), we want to see how patient does after abx. This is with time and clinical evaluation and not so much culture of healing wound as expected to have some bacteria in any open wound   If clinical sign of infection, rising inflammatory markers then we would be more concerned if there is a new infection and then imaging to guide location of biopsy and I&D  Imaging by itself without other sign of infection, especially recent resection done, would be very nonspecific   At this time, I would advise podiatry follow up and not taking any medication  Allergy referral to cephalexin  allergy testing    Follow up in 6 weeks

## 2024-03-03 NOTE — Progress Notes (Signed)
 [facility]  Reason for Consult:abnormal mri left foot Referring Provider: Rosan Raisin    Patient Active Problem List   Diagnosis Date Noted   Constipation 04/25/2023   Dyspnea 03/17/2023   Posterior vitreous detachment of both eyes 03/14/2023   Overactive bladder 12/09/2022   Atopic dermatitis 12/09/2022   Long term (current) use of anticoagulants 12/05/2022   Lymphedema 09/13/2022   Venous stasis ulcer without varicose veins (HCC) 09/09/2022   Anemia 09/09/2022   Oropharyngeal dysphagia 04/23/2022   Leg swelling 04/23/2022   Confusion 04/23/2022   Type 2 diabetes mellitus with other diabetic ophthalmic complication (HCC) 04/16/2022   Shoulder pain 03/22/2022   Hypoglycemia 03/20/2022   Frequent falls 03/19/2022   Chronic combined systolic and diastolic CHF (congestive heart failure) (HCC) 03/11/2022   CKD (chronic kidney disease), stage III (HCC) 03/11/2022   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   History of DVT (deep vein thrombosis) 03/11/2022   History of pulmonary embolism 03/11/2022   Hyperlipidemia 03/11/2022   Obesity (BMI 30-39.9) 03/11/2022   CAD (coronary artery disease) 01/30/2022   Depression 01/30/2022   Cardiomyopathy (HCC) 01/30/2022   Posttraumatic stress disorder 01/30/2022   Rheumatoid arthritis (HCC) 08/09/2021   New onset of headaches after age 62 08/09/2021   Non-ST elevation (NSTEMI) myocardial infarction [facility])    Acute pulmonary embolism (HCC) 07/22/2021   Acute DVT (deep venous thrombosis) (HCC) 07/22/2021   Elevated troponin 07/22/2021   Anxiety 07/22/2021   Ankle swelling 01/17/2020   GERD (gastroesophageal reflux disease) 06/04/2018   Hypogonadism in male 04/01/2018   Macrocytic anemia 09/30/2017   Itching 09/30/2017   Hypothyroidism 09/30/2017   Low testosterone  09/30/2017   Benign prostatic hyperplasia without lower urinary tract symptoms 09/30/2017   Erectile dysfunction 03/19/2016   Benign  essential HTN 08/08/2015   Branch retinal vein occlusion with macular edema of left eye (HCC) 03/25/2015   Nuclear sclerotic cataract of both eyes 03/25/2015   Myositis 09/28/2014   DM2 (diabetes mellitus, type 2) (HCC) 09/28/2014   Abnormal glucose 10/04/2012      HPI: [name] is a 73 y.o. male 73 yo male idiopathic myopathy on immunosuppressants, dm2, pvd, referred by wound care for mri finding of left foot distal MT abnormality in setting of recent left foot mt head resection   Reviewed the wound care note Mri done 10/4 showing 5th mt head hyperintensity/bone edema  8/11 left fifth mt head resection and I&D by podiatry at [facility]  He was followed by a wound center who obtained 01/14/24 wound swab and patient wanted to go to a different wound center who got mri and referred him here  It appears the concern leading to the mri on 10/4 was just the wound cx on 9/16  Overall the wound is closing  No fever chill   He described a couple episodes of rash --- itch hives/rash. He did have dermatology visit who thinks it's eczema of some sort (end of 01/2024) 09/2023 cephalexin  August or July 2025 also had doxycycline  and developed rash, sore throat, malaise, tongue sore, malaise, nausea, eye pain after 3 doses.   Did tolerate doxy/cephalexin /pcn before   Currently getting wound packing and topical abx including metronidazole    See cultures below staph/prevotella (swab cx of the wound)    Again his wound is closing and he has no pain/swelling/purulence. He was confused having fever/chill/foot pain/swelling/discharge after some skin trimming leading to the 12/09/23 5th mt bone resection.  Meds: Apixaban  Evolocumab  Insulin  glargine Lactulose  Latanoprost  eye drop Levothyroxine  Metformin Methorexate Metoprolol  xl Mirabegron  Pantoprazole  quetiapine  Semaglutide Sertraline  Tamsulosin Torsemide  Prednisone  8 mg daily tapered course -- goal is to get to  5 mg daily   Pmh: Dm2 Hypothyroidism Anemia Anxiety/depression Obesity Venous stasis Cad Hx dvt Hx pe Htn Cardiomyopathy Chf (combined) Dysphagia Gerd   Review of Systems: ROS  All other ros negative     Past Medical History:  Diagnosis Date   Atopic dermatitis 12/09/2022   Autoimmune disease    Bronchitis    Chronic HFrEF (heart failure with reduced ejection fraction) (HCC)    normalized EF   Chronic hiccups    Chronic kidney disease, stage 3a (HCC)    DM2 (diabetes mellitus, type 2) (HCC) 09/28/2014   DVT (deep venous thrombosis) (HCC)    GERD (gastroesophageal reflux disease)    Hypogonadism in male 04/01/2018   Hypothyroidism    Joint pain    Necrotizing myopathy    Nonobstructive atherosclerosis of coronary artery    Posttraumatic stress disorder 01/30/2022   PTSD (post-traumatic stress disorder)    Pulmonary embolism (HCC)    Rheumatoid arthritis (HCC) 08/09/2021   RSV (respiratory syncytial virus pneumonia) 03/11/2022    Social History   Tobacco Use   Smoking status: Never   Smokeless tobacco: Never  Vaping Use   Vaping status: Never Used  Substance Use Topics   Alcohol  use: No   Drug use: No    Family History  Problem Relation Age of Onset   Hypertension Mother    Diabetes Mother    Arthritis Mother    Depression Mother    Heart attack Father    Hypertension Father     Allergies  Allergen Reactions   Semaglutide Diarrhea and Rash    Diarrhea   Benazepril Hives and Cough   Gabapentin  Swelling   Hydrocodone Hives and Itching   Shellfish Allergy Diarrhea and Nausea And Vomiting    OBJECTIVE: Vitals:   03/03/24 1520  BP: 137/76  Pulse: (!) 102  Temp: 97.7 F (36.5 C)  TempSrc: Temporal  SpO2: 99%   There is no height or weight on file to calculate BMI.   Physical Exam General/constitutional: no distress, pleasant HEENT: Normocephalic, PER, Conj Clear, EOMI, Oropharynx clear Neck supple CV: rrr no mrg Lungs: clear  to auscultation, normal respiratory effort Abd: Soft, Nontender Ext: 1+ edema bilateral legs Skin: No Rash Neuro: nonfocal MSK: left foot dorsal ulcer almost healed; no tenderness/purulence      Central line presence: no  Lab:  Microbiology:  Serology:  Imaging: Reviewed  12/08/23 abi Right Ankle  The AB ratio in the right side measures 1.10. ABI maybe falsely elevated due to artery  calcification.  Right Segmental  Right DP waveforms are monophasic. Right PTA waveforms are monophasic.  Left Ankle  The AB ratio in the left side measures 1.24. ABI maybe falsely elevated due to artery calcification.  Left Segmental  Left DP waveforms are monophasic. Left PTA waveforms are monophasic.     12/09/23 mri left foot Moderate dorsal subcutaneous edema without evidence of a focal drainable  collection.   Degenerative changes in the mid forefoot without accelerated arthrosis. There  is no evidence to suggest osteomyelitis at the current time.   Questionable pressure lesion with possible wound in the plantar aspect of the  lateral forefoot adjacent to the fifth metatarsal head. No underlying  osteomyelitis. Follow-up if symptoms progress.   02/01/24 mri left foot 1. Status  post fifth metatarsal head and neck resection with wound packing material within the resection site. No loculated fluid collection. There is hyperintense marrow signal of the distal fifth metatarsal transsection margin and the base of the fifth proximal phalanx without significant corresponding T1 hypointensity. These findings are suggestive of reactive marrow edema, however, osteomyelitis can not be excluded. 2. Hallux valgus deformity with moderate first MTP joint osteoarthritis. 3. Moderate subcutaneous edema along the dorsal foot.  Assessment/plan: Problem List Items Addressed This Visit   None Visit Diagnoses       Allergy to multiple antibiotics    -  Primary   Relevant Orders   Ambulatory  referral to Allergy     Immunosuppressed status         Diabetic ulcer of left midfoot associated with type 2 diabetes mellitus, unspecified ulcer stage (HCC)       Relevant Orders   CBC   COMPLETE METABOLIC PANEL WITHOUT GFR   C-reactive protein   Sedimentation rate       73 yo male immunosuppressed on low dose pred/methotrexate  for myopathy, dm2, referred by wound center for mri hyperintensity of left 5th distal mt and prox phalanx base in setting recent I&D there   Patient developed left foot infection after skin trimming by podiatry, and then admitted to [facility] 12/09/23 underwent left foot I&D and 5th metatarsal head resection  He had wound care after that who swabbed his healing incision that grew prevotela/mssa light growth   He then went to another wound center who had mri 10/4 and referred here.   Question of cephlexin or doxycycline  allergy (tolerated in the past)  Getting some topical abx as part of wound care ?metronidazole   Wound improving significantly   Micro: 8/11 operative cx moderate mssa, mixed normal flora 9/16 left foot culture unclear if swab by wound care or other group Prevotella and light growth mssa (S doxy, bactrim, clinda)   Discussed I am not sure about the positive benefit of topical abx for these cases. The concern is always underdosing and abx resistance   From ID standpoint and treatment of osteomyelitis standpoint (which he had 12/09/23), we want to see how patient does after abx. This is with time and clinical evaluation and not so much culture of healing wound as expected to have some bacteria in any open wound   If clinical sign of infection, rising inflammatory markers then we would be more concerned if there is a new infection and then imaging to guide location of biopsy and I&D  Imaging by itself without other sign of infection, especially recent resection done, would be very nonspecific   At this time, I would advise  podiatry follow up and not taking any medication  Allergy referral to cephalexin  allergy testing    Follow up in 6 weeks    Labs today   Follow-up: Return in about 6 weeks (around 04/14/2024).  Constance ONEIDA Passer, MD [facility] 03/03/2024, 3:18 PM

## 2024-03-04 ENCOUNTER — Encounter (HOSPITAL_BASED_OUTPATIENT_CLINIC_OR_DEPARTMENT_OTHER): Admitting: Internal Medicine

## 2024-03-04 DIAGNOSIS — E11621 Type 2 diabetes mellitus with foot ulcer: Secondary | ICD-10-CM | POA: Diagnosis not present

## 2024-03-04 DIAGNOSIS — L97528 Non-pressure chronic ulcer of other part of left foot with other specified severity: Secondary | ICD-10-CM | POA: Diagnosis not present

## 2024-03-04 DIAGNOSIS — I890 Lymphedema, not elsewhere classified: Secondary | ICD-10-CM | POA: Diagnosis not present

## 2024-03-04 LAB — CBC
HCT: 29.8 % — ABNORMAL LOW (ref 38.5–50.0)
Hemoglobin: 9.9 g/dL — ABNORMAL LOW (ref 13.2–17.1)
MCH: 31.2 pg (ref 27.0–33.0)
MCHC: 33.2 g/dL (ref 32.0–36.0)
MCV: 94 fL (ref 80.0–100.0)
MPV: 9.5 fL (ref 7.5–12.5)
Platelets: 316 Thousand/uL (ref 140–400)
RBC: 3.17 Million/uL — ABNORMAL LOW (ref 4.20–5.80)
RDW: 14 % (ref 11.0–15.0)
WBC: 7.6 Thousand/uL (ref 3.8–10.8)

## 2024-03-04 LAB — COMPLETE METABOLIC PANEL WITHOUT GFR
AG Ratio: 2.4 (calc) (ref 1.0–2.5)
ALT: 14 U/L (ref 9–46)
AST: 22 U/L (ref 10–35)
Albumin: 4.1 g/dL (ref 3.6–5.1)
Alkaline phosphatase (APISO): 72 U/L (ref 35–144)
BUN/Creatinine Ratio: 18 (calc) (ref 6–22)
BUN: 24 mg/dL (ref 7–25)
CO2: 21 mmol/L (ref 20–32)
Calcium: 9.8 mg/dL (ref 8.6–10.3)
Chloride: 100 mmol/L (ref 98–110)
Creat: 1.35 mg/dL — ABNORMAL HIGH (ref 0.70–1.28)
Globulin: 1.7 g/dL — ABNORMAL LOW (ref 1.9–3.7)
Glucose, Bld: 214 mg/dL — ABNORMAL HIGH (ref 65–99)
Potassium: 5.1 mmol/L (ref 3.5–5.3)
Sodium: 136 mmol/L (ref 135–146)
Total Bilirubin: 0.4 mg/dL (ref 0.2–1.2)
Total Protein: 5.8 g/dL — ABNORMAL LOW (ref 6.1–8.1)

## 2024-03-04 LAB — GLUCOSE, CAPILLARY
Glucose-Capillary: 190 mg/dL — ABNORMAL HIGH (ref 70–99)
Glucose-Capillary: 151 mg/dL — ABNORMAL HIGH (ref 70–99)

## 2024-03-04 LAB — SEDIMENTATION RATE: Sed Rate: 53 mm/h — ABNORMAL HIGH (ref 0–20)

## 2024-03-04 LAB — C-REACTIVE PROTEIN: CRP: <3 mg/L (ref <8.0)

## 2024-03-05 ENCOUNTER — Encounter (HOSPITAL_BASED_OUTPATIENT_CLINIC_OR_DEPARTMENT_OTHER): Admitting: Internal Medicine

## 2024-03-05 DIAGNOSIS — E11621 Type 2 diabetes mellitus with foot ulcer: Secondary | ICD-10-CM

## 2024-03-05 DIAGNOSIS — L97528 Non-pressure chronic ulcer of other part of left foot with other specified severity: Secondary | ICD-10-CM

## 2024-03-05 DIAGNOSIS — I890 Lymphedema, not elsewhere classified: Secondary | ICD-10-CM | POA: Diagnosis not present

## 2024-03-05 LAB — GLUCOSE, CAPILLARY
Glucose-Capillary: 225 mg/dL — ABNORMAL HIGH (ref 70–99)
Glucose-Capillary: 213 mg/dL — ABNORMAL HIGH (ref 70–99)

## 2024-03-06 ENCOUNTER — Encounter (HOSPITAL_BASED_OUTPATIENT_CLINIC_OR_DEPARTMENT_OTHER): Admitting: Internal Medicine

## 2024-03-06 DIAGNOSIS — E11621 Type 2 diabetes mellitus with foot ulcer: Secondary | ICD-10-CM | POA: Diagnosis not present

## 2024-03-06 DIAGNOSIS — L97528 Non-pressure chronic ulcer of other part of left foot with other specified severity: Secondary | ICD-10-CM

## 2024-03-06 DIAGNOSIS — I890 Lymphedema, not elsewhere classified: Secondary | ICD-10-CM | POA: Diagnosis not present

## 2024-03-06 LAB — GLUCOSE, CAPILLARY: Glucose-Capillary: 231 mg/dL — ABNORMAL HIGH (ref 70–99)

## 2024-03-07 LAB — GLUCOSE, CAPILLARY: Glucose-Capillary: 127 mg/dL — ABNORMAL HIGH (ref 70–99)

## 2024-03-09 ENCOUNTER — Encounter (HOSPITAL_BASED_OUTPATIENT_CLINIC_OR_DEPARTMENT_OTHER): Admitting: Internal Medicine

## 2024-03-10 ENCOUNTER — Encounter (HOSPITAL_BASED_OUTPATIENT_CLINIC_OR_DEPARTMENT_OTHER): Admitting: Internal Medicine

## 2024-03-11 ENCOUNTER — Encounter (HOSPITAL_BASED_OUTPATIENT_CLINIC_OR_DEPARTMENT_OTHER): Admitting: Internal Medicine

## 2024-03-12 ENCOUNTER — Encounter (HOSPITAL_BASED_OUTPATIENT_CLINIC_OR_DEPARTMENT_OTHER): Admitting: General Surgery

## 2024-03-12 DIAGNOSIS — L97528 Non-pressure chronic ulcer of other part of left foot with other specified severity: Secondary | ICD-10-CM | POA: Diagnosis not present

## 2024-03-12 DIAGNOSIS — L97524 Non-pressure chronic ulcer of other part of left foot with necrosis of bone: Secondary | ICD-10-CM | POA: Diagnosis not present

## 2024-03-12 DIAGNOSIS — I890 Lymphedema, not elsewhere classified: Secondary | ICD-10-CM | POA: Diagnosis not present

## 2024-03-12 DIAGNOSIS — E11621 Type 2 diabetes mellitus with foot ulcer: Secondary | ICD-10-CM | POA: Diagnosis not present

## 2024-03-12 LAB — GLUCOSE, CAPILLARY: Glucose-Capillary: 216 mg/dL — ABNORMAL HIGH (ref 70–99)

## 2024-03-13 ENCOUNTER — Encounter (HOSPITAL_BASED_OUTPATIENT_CLINIC_OR_DEPARTMENT_OTHER): Admitting: General Surgery

## 2024-03-13 DIAGNOSIS — E11621 Type 2 diabetes mellitus with foot ulcer: Secondary | ICD-10-CM | POA: Diagnosis not present

## 2024-03-13 DIAGNOSIS — L97524 Non-pressure chronic ulcer of other part of left foot with necrosis of bone: Secondary | ICD-10-CM | POA: Diagnosis not present

## 2024-03-13 DIAGNOSIS — I890 Lymphedema, not elsewhere classified: Secondary | ICD-10-CM | POA: Diagnosis not present

## 2024-03-13 DIAGNOSIS — L97528 Non-pressure chronic ulcer of other part of left foot with other specified severity: Secondary | ICD-10-CM | POA: Diagnosis not present

## 2024-03-13 LAB — GLUCOSE, CAPILLARY
Glucose-Capillary: 172 mg/dL — ABNORMAL HIGH (ref 70–99)
Glucose-Capillary: 205 mg/dL — ABNORMAL HIGH (ref 70–99)

## 2024-03-16 ENCOUNTER — Encounter (HOSPITAL_BASED_OUTPATIENT_CLINIC_OR_DEPARTMENT_OTHER): Admitting: General Surgery

## 2024-03-17 ENCOUNTER — Telehealth: Payer: Self-pay | Admitting: Family Medicine

## 2024-03-17 ENCOUNTER — Encounter (HOSPITAL_BASED_OUTPATIENT_CLINIC_OR_DEPARTMENT_OTHER): Admitting: Internal Medicine

## 2024-03-17 DIAGNOSIS — L97528 Non-pressure chronic ulcer of other part of left foot with other specified severity: Secondary | ICD-10-CM

## 2024-03-17 DIAGNOSIS — E11621 Type 2 diabetes mellitus with foot ulcer: Secondary | ICD-10-CM | POA: Diagnosis not present

## 2024-03-17 DIAGNOSIS — I890 Lymphedema, not elsewhere classified: Secondary | ICD-10-CM

## 2024-03-17 LAB — GLUCOSE, CAPILLARY
Glucose-Capillary: 208 mg/dL — ABNORMAL HIGH (ref 70–99)
Glucose-Capillary: 155 mg/dL — ABNORMAL HIGH (ref 70–99)

## 2024-03-17 NOTE — Telephone Encounter (Signed)
 Patient was identified as falling into the [place] Measure - Diabetes.   Patient was: Left voicemail to schedule with primary care provider.

## 2024-03-18 ENCOUNTER — Encounter (HOSPITAL_BASED_OUTPATIENT_CLINIC_OR_DEPARTMENT_OTHER): Admitting: Internal Medicine

## 2024-03-18 DIAGNOSIS — I890 Lymphedema, not elsewhere classified: Secondary | ICD-10-CM | POA: Diagnosis not present

## 2024-03-18 DIAGNOSIS — L97528 Non-pressure chronic ulcer of other part of left foot with other specified severity: Secondary | ICD-10-CM

## 2024-03-18 DIAGNOSIS — E11621 Type 2 diabetes mellitus with foot ulcer: Secondary | ICD-10-CM | POA: Diagnosis not present

## 2024-03-18 LAB — GLUCOSE, CAPILLARY
Glucose-Capillary: 214 mg/dL — ABNORMAL HIGH (ref 70–99)
Glucose-Capillary: 167 mg/dL — ABNORMAL HIGH (ref 70–99)

## 2024-03-19 ENCOUNTER — Encounter (HOSPITAL_BASED_OUTPATIENT_CLINIC_OR_DEPARTMENT_OTHER): Admitting: Internal Medicine

## 2024-03-20 ENCOUNTER — Encounter (HOSPITAL_BASED_OUTPATIENT_CLINIC_OR_DEPARTMENT_OTHER): Admitting: Internal Medicine

## 2024-04-09 DIAGNOSIS — H35372 Puckering of macula, left eye: Secondary | ICD-10-CM | POA: Diagnosis not present

## 2024-04-09 DIAGNOSIS — E113293 Type 2 diabetes mellitus with mild nonproliferative diabetic retinopathy without macular edema, bilateral: Secondary | ICD-10-CM | POA: Diagnosis not present

## 2024-04-09 DIAGNOSIS — Z961 Presence of intraocular lens: Secondary | ICD-10-CM | POA: Diagnosis not present

## 2024-04-09 DIAGNOSIS — H34832 Tributary (branch) retinal vein occlusion, left eye, with macular edema: Secondary | ICD-10-CM | POA: Diagnosis not present

## 2024-04-13 ENCOUNTER — Telehealth (HOSPITAL_BASED_OUTPATIENT_CLINIC_OR_DEPARTMENT_OTHER): Payer: Self-pay

## 2024-04-13 NOTE — Telephone Encounter (Signed)
° °  Pre-operative Risk Assessment    Patient Name: [name]  DOB: 14-Jun-1950 MRN: [id number]   Date of last office visit: 10/09/2023 with Damien Braver, NP Date of next office visit: 04/17/2024 with Reche Finder, NP  Request for Surgical Clearance    Procedure:  Colonoscopy/EGD  Date of Surgery:  Clearance 05/19/24                                 Surgeon:  Does not speciify  Surgeon's Group or Practice Name:  [facility] Phone number:  [phone] Fax number:  [phone]   Type of Clearance Requested:   - Medical  - Pharmacy:  Hold Apixaban  (Eliquis ) -2 days prior   Type of Anesthesia:  (Propofol?) Does not specify   Additional requests/questions:  None  SignedPatrcia Iverson CROME   04/13/2024, 2:51 PM

## 2024-04-13 NOTE — Telephone Encounter (Signed)
 Pharmacy please advise on holding Eliquis  prior to Colonoscopy and EDG scheduled for 05/19/2024.Last labs 03/03/2024.  Thank you.

## 2024-04-13 NOTE — Telephone Encounter (Signed)
 The patient has an upcoming visit scheduled with Reche Finder,  NP on 04/17/2024 at which time clearance can be addressed in case there are any issues that would impact surgical recommendations.

## 2024-04-13 NOTE — Telephone Encounter (Signed)
° °  Name: [name]  DOB: 08-17-1950  MRN: [id number]  Primary Cardiologist: Vinie JAYSON Maxcy, MD  Chart reviewed as part of pre-operative protocol coverage. The patient has an upcoming visit scheduled with Reche Finder,  NP on 04/17/2024 at which time clearance can be addressed in case there are any issues that would impact surgical recommendations.  Colonoscopy and EDG is not scheduled until 05/19/2024 as below. I added preop FYI to appointment note so that provider is aware to address at time of outpatient visit.  Per office protocol the cardiology provider should forward their finalized clearance decision and recommendations regarding antiplatelet therapy to the requesting party below.    This message will also be routed to pharmacy pool  for input on holding Eliquis  as requested below so that this information is available to the clearing provider at time of patient's appointment.   I will route this message as FYI to requesting party and remove this message from the preop box as separate preop APP input not needed at this time.   Please call with any questions.  Lamarr Satterfield, NP  04/13/2024, 3:04 PM

## 2024-04-15 NOTE — Telephone Encounter (Signed)
 Noted, now available from 12/19 OV.

## 2024-04-15 NOTE — Telephone Encounter (Signed)
 Sent chat msg to pharm team to try to prioritize review by 12/19 OV.

## 2024-04-15 NOTE — Telephone Encounter (Signed)
 Patient with diagnosis of PE/DVT on Eliquis  for anticoagulation.    Procedure:  Colonoscopy/EGD   Date of Surgery:  Clearance 05/19/24  CrCl 71 Platelet count 316  Per office protocol, patient can hold Eliquis  for 2 days prior to procedure.   Patient will not need bridging with Lovenox (enoxaparin) around procedure.  **This guidance is not considered finalized until pre-operative APP has relayed final recommendations.**

## 2024-04-17 ENCOUNTER — Encounter (HOSPITAL_BASED_OUTPATIENT_CLINIC_OR_DEPARTMENT_OTHER): Payer: Self-pay | Admitting: Family

## 2024-04-17 ENCOUNTER — Ambulatory Visit (INDEPENDENT_AMBULATORY_CARE_PROVIDER_SITE_OTHER): Admitting: Family

## 2024-04-17 VITALS — BP 118/62 | HR 97 | Ht 68.0 in | Wt 209.0 lb

## 2024-04-17 DIAGNOSIS — I5020 Unspecified systolic (congestive) heart failure: Secondary | ICD-10-CM | POA: Diagnosis not present

## 2024-04-17 DIAGNOSIS — I5032 Chronic diastolic (congestive) heart failure: Secondary | ICD-10-CM | POA: Diagnosis not present

## 2024-04-17 DIAGNOSIS — I25118 Atherosclerotic heart disease of native coronary artery with other forms of angina pectoris: Secondary | ICD-10-CM | POA: Diagnosis not present

## 2024-04-17 DIAGNOSIS — I10 Essential (primary) hypertension: Secondary | ICD-10-CM

## 2024-04-17 DIAGNOSIS — R600 Localized edema: Secondary | ICD-10-CM | POA: Diagnosis not present

## 2024-04-17 DIAGNOSIS — Z86718 Personal history of other venous thrombosis and embolism: Secondary | ICD-10-CM | POA: Diagnosis not present

## 2024-04-17 LAB — LDL CHOLESTEROL, DIRECT: LDL Direct: 56 mg/dL (ref 0–99)

## 2024-04-17 NOTE — Patient Instructions (Addendum)
 Medication Instructions:  Continue your current medications.   No Eliquis  1/18, 1/19, or 1/20  Resume after procedure as directed by gastroenterology  *If you need a refill on your cardiac medications before your next appointment, please call your pharmacy*  Labs: Your physician recommends that you return for lab work today: direct LDL  Follow-Up: At [facility], you and your health needs are our priority.  As part of our continuing mission to provide you with exceptional heart care, our providers are all part of one team.  This team includes your primary Cardiologist (physician) and Advanced Practice Providers or APPs (Physician Assistants and Nurse Practitioners) who all work together to provide you with the care you need, when you need it.  Your next appointment:   6 month(s)  Provider:   K. Chad Hilty, MD, Rosaline Bane, NP, or Reche Finder, NP    We recommend signing up for the patient portal called MyChart.  Sign up information is provided on this After Visit Summary.  MyChart is used to connect with patients for Virtual Visits (Telemedicine).  Patients are able to view lab/test results, encounter notes, upcoming appointments, etc.  Non-urgent messages can be sent to your provider as well.   To learn more about what you can do with MyChart, go to [email or web address].   Other Instructions  Reche GORMAN Finder, NP will let GI know you're good to go for your colonoscopy/EGD.

## 2024-04-17 NOTE — Progress Notes (Unsigned)
 . Cardiology Office Note:  .   Date:  04/17/2024  ID:  [name], DOB 11/03/1950, MRN [id number] PCP: [name] Bring, DO  [facility] Providers Cardiologist:  [name] JAYSON Maxcy, MD    History of Present Illness: .   [name] is a 73 y.o. male with a hx of hypertension, DM2, nonobstructive CAD, autoimmune myositis, PTSD, PE, DVT, HFrEF, CKD 3.   He was admitted 07/21/2021 and acute PE as well as acute RLE DVT with acute hypoxic respiratory failure in the setting of testosterone  injections were discontinued.  Discharged on Eliquis .  Recommended for indefinite OAC.  Echo  07/23/2021 with LVEF 35-40%, wall motion abnormalities noted, grade 1 diastolic dysfunction, trivial MR, mild AI, mild dilation ascending aorta 41 mm.  Due to AKI he was recommended for outpatient ischemic evaluation.  Cardiac CTA 08/28/2021 coronary calcium  score of 129 placing him in the 64th percentile for age, gender. LM 1-24% stenosis and ostial/prox LAD mild 25-49% stenosis (FFR 0.88 prox and 0.82 mid - no significant stenosis).   Repeat echo 12/20/21 LVEF 55-60%, stable wall motion abnormalities, gr1DD.   Underwent sleep study 02/20/2022 with moderate OSA recommended for CPAP titration.   Echo 02/2022 admission for PNA and HF exacerbation with LVEF 55 to 60%, grade 1 diastolic dysfunction, aortic valve sclerosis without stenosis, mild dilation aortic root 41 mm.     Readmitted 11/20 - 03/24/2022 with acute metabolic encephalopathy with hypoglycemia, dehydrationSpironolactone, torsemide  were discontinued. At PCP visit 03/29/22 due to pitting edema Torsemide  resumed.    Admitted 5/15-5/18/24 with LOC seizure vs syncope. CT/MRI brain unremarkable. EEG no seizures. Workup did reveal various degrees of foraminal stenosis recommended for outpatient neurosurgery evaluation. Echo 09/13/22 LVEF 60-65%, no RWMA, mild LVH, no significant valvular abnomalities.   At visit 06/2023 Repatha  initiated.   Last seen 10/09/23 by [name] Braver, NP doing well from cardiac perspective. He was having difficulties with constipation and had follow up with GI. He was advised to take additional Torsemide  daily x 3 days due to edema.    He presents today with his wife. His 30 year old son is a studying at [employer] with goal of being an MD. Follows with [name] for myopathy, they and his rheumatology team recommended against statin. Presents today for preop clearance for colonoscopy/EGD. Per office protocols and pharmacist review, may  hold Eliquis  2 days prior. Will not require bridging with Lovenox.    Reports indigestion but chest pain, pressure, tightness. His indigestion is unrelieved by alka seltzer and Tums. He also notes nausea. Breathing has been stable. No new dyspnea.  Lower extremity edema flubtuates. It does improve when he uses his lymphedema pumps.   Mild cold symptoms after Repatha . Prefers to continue as unable to take statin.   ?LDL  ROS: Please see the history of present illness.    All other systems reviewed and are negative.   Studies Reviewed: SABRA   EKG Interpretation Date/Time:  Friday April 17 2024 14:15:57 EST Ventricular Rate:  97 PR Interval:  164 QRS Duration:  74 QT Interval:  350 QTC Calculation: 444 R Axis:   -27  Text Interpretation: Normal sinus rhythm Left ventricular hypertrophy Artifact noted Confirmed by Vannie Mora ([id number]) on 04/17/2024 2:34:27 PM       Risk Assessment/Calculations:         STOP-Bang Score:     { Consider Dx Sleep Disordered Breathing or Sleep Apnea  ICD G47.33          :1}  Physical Exam:   VS:  BP 118/62 (BP Location: Right Arm, Patient Position: Sitting, Cuff Size: Normal)   Pulse 97   Ht 5' 8 (1.727 m)   Wt 209 lb (94.8 kg)   SpO2 98% Comment: RA  BMI 31.78 kg/m    Wt Readings from Last 3 Encounters:  04/17/24 209 lb (94.8 kg)  12/26/23 228 lb (103.4 kg)  10/09/23 235 lb (106.6 kg)    GEN: Well nourished, overweight,  well developed  in no acute distress NECK: No JVD; No carotid bruits CARDIAC: RRR, no murmurs, rubs, gallops RESPIRATORY:  Clear to auscultation without rales, wheezing or rhonchi  ABDOMEN: Soft, non-tender, non-distended EXTREMITIES:  Bilateral LE with 1+ pitting edema ; No deformity   ASSESSMENT AND PLAN: .    Preop - *** Lymphedema-following with lymphedema [name].  Wearing lymphedema pumps.    PE / Hx of DVT - In setting of testosterone  injections, admission 06/2021 with acute saddle PE with right heart strain and right popliteal s/p thrombectomy 07/22/21.  LVEF has since recovered.  Continue Eliquis  5 mg twice daily.  Will require lifelong anticoagulation.  Does not meet dose reduction criteria.  Denies bleeding complications.     HTN - BP well controlled. Continue current antihypertensive regimen Toprol  12.5mg  daily, Torsemide  20mg  daily. Discussed to monitor BP at home at least 2 hours after medications and sitting for 5-10 minutes.    Nonobstructive CAD / HLD, LDL goal <70 - Cardiac CTA 08/28/21 moderate nonobstructive disease. No anginal symptoms.    No ASA due to OAC.  Due to myopathy per rheumatology cannot be on statin. Rx Repatha  140mg  q14 days. Sample provided FLP/LFT after 6 doses of Repatha .  Given information for [facility]. Heart healthy diet and regular cardiovascular exercise encouraged.     [id number] - Careful titration of diuretic and antihypertensive.  06/24/2023 creatinine 1.36, GFR 55   OSA - CPAP compliance encouraged.    Combined heart failure with recovered LVEF - LVEF 35-40% in setting of PE. 11/2021 and 02/2022 repeat echo with LVEF returned to normal 55-60%. GDMT includes Metoprolol , Torsemide . Entresto , Spironolactone  previously stopped due to hypotension and as LVEF recovered. Low sodium diet, fluid restriction <2L, and daily weights encouraged. Educated to contact our office for weight gain of 2 lbs overnight or 5 lbs in one week. Per wound [name] request, update  CMP/BNP due to persistent volume difficulties.   GERD / Hypothyroidism / DM2 - Per PCP.        Dispo: follow up in 3 mos  Signed, [name] GORMAN Finder, NP

## 2024-04-18 ENCOUNTER — Encounter (HOSPITAL_BASED_OUTPATIENT_CLINIC_OR_DEPARTMENT_OTHER): Payer: Self-pay | Admitting: Family

## 2024-04-20 ENCOUNTER — Ambulatory Visit (HOSPITAL_BASED_OUTPATIENT_CLINIC_OR_DEPARTMENT_OTHER): Payer: Self-pay | Admitting: Family

## 2024-04-20 NOTE — Progress Notes (Signed)
 " Chief Complaint: Chief Complaint  Patient presents with   Follow-up    (Room 11) Patient states he is here for a ulcerated, foot, left/Follow-up/[name]     Post-op    73 year old male presents clinic today for follow-up evaluation status post left fifth metatarsal head resection with wound debridement performed 12/09/2023.  Patient states he is progressing well.  Patient's wound has healed.  Patient has been undergoing advanced wound care modalities with the wound center and also underwent hyperbaric oxygen  therapy as well.  Patient is here today for further evaluation.       ROS: Constitutional: Negative for activity change, appetite change, chills, fever and unexpected weight change.  HENT: Negative for nosebleeds, trouble swallowing and voice change.   Respiratory: Negative for chest tightness, shortness of breath, wheezing and stridor.   Cardiovascular: Negative for chest pain, palpitations and leg swelling.  Gastrointestinal: Negative for abdominal pain, blood in stool, constipation and diarrhea.  Genitourinary: Negative for flank pain,or  hematuria  VITALS There were no vitals filed for this visit.   Past Medical Hx: Medical History[1]  Current Meds: Current Medications[2]  Allergies: Allergies[3]  Exam: Vascular: Palpable dorsalis pedis pulse right. Palpable dorsalis pedis pulse left. Palpable posterior tibial pulse right . Palpable posterior tibial pulse left. No pallor on elevation or dependent rubor. Integument: Incision site to left foot dorsal fifth metatarsal phalangeal joint appears well-healed.  Prior ulceration to the plantar aspect of the fifth metatarsal head left remains well-healed on exam today.   Neurological: Diminished sensation to 10 g monofilament to the level of the digits bilateral. Musculoskeletal: Normal muscle mass and tone symmetric, bilateral.  Status post fifth metatarsal head resection  left    Imaging: Reviewed.  Dx: 1. Diabetic polyneuropathy associated with type 2 diabetes mellitus    (CMD)      2. Ulcerated, foot, left, with fat layer exposed (CMD)            Plan: A focused history and physical exam was performed today.  I discussed the findings with the patient.  Patient is status post left fifth metatarsal head resection with wound debridement.  Patient's ulceration appears well-healed on exam today.  Recommend good supportive diabetic shoe wear.  Recommend custom accommodative orthotic as patient has diabetes with history of prior foot ulceration and is status post partial fifth metatarsal head resection.  Patient is to return to clinic in 3 months for follow-up evaluation or sooner as needed. Future Appt.: Scheduled Future Appointments       Provider Department Dept Phone Center   04/28/2024 8:00 AM Ozell GORMAN Buster [facility] Neurology [phone] [facility] [address]   05/21/2024 1:20 PM Reyes Mcardle Bond [facility]  - Ophthalmology [place] [phone]            04/20/2024          [1] Past Medical History: Diagnosis Date   Diabetes mellitus (CMD)    Diabetes mellitus type II, controlled (CMD)    Hypertension    Hypothyroid    Myositis    PTSD (post-traumatic stress disorder)    Sleep apnea    no CPAP  [2]  Current Outpatient Medications:    latanoprost  (XALATAN ) 0.005 % ophthalmic solution, ADMINISTER 1 DROP INTO EACH EYES AT BEDTIME., Disp: 2.5 mL, Rfl: 11   acetaminophen  (TYLENOL ) 500 mg tablet, Take 1,000 mg by mouth every 4 (four) hours as needed for moderate pain (4-6) or mild  pain (1-3)., Disp: , Rfl:    aluminum hydroxide-magnesium  carbonate (Gaviscon) 95-358 mg/15 mL susp oral suspension, AKE 2 TEASPOONFULS BY MOUTH THREE TIMES A DAY AS NEEDED FOR HEARTBURN (Patient taking differently: as needed.), Disp: , Rfl:    apixaban  (ELIQUIS ) 5 mg tab, Take 1  tablet by mouth 2 (two) times a day., Disp: , Rfl:    baclofen  (LIORESAL ) 20 mg tablet, Take 1 tablet (20 mg total) by mouth daily. (Patient taking differently: Take 20 mg by mouth daily as needed for muscle spasms (Hiccups).), Disp: 90 tablet, Rfl: 0   carboxymethylcellulose (REFRESH PLUS) 0.5 % dpet, , Disp: , Rfl:    cetirizine  (ZyrTEC ) 10 mg tablet, Take 10 mg by mouth daily., Disp: , Rfl:    cholecalciferol (VITAMIN D3) 1,000 unit (25 mcg) tablet, Take 1 tablet by mouth every morning., Disp: , Rfl:    diclofenac sodium (VOLTAREN) 1 % gel, Apply 1 g topically 3 (three) times a day as needed., Disp: , Rfl:    docusate sodium  (COLACE) 100 mg capsule, Take 200 mg by mouth daily., Disp: , Rfl:    DULoxetine  (CYMBALTA ) 30 mg capsule, Take 1 capsule (30 mg total) by mouth 2 (two) times a day., Disp: 60 capsule, Rfl: 6   flash glucose scanning reader (FreeStyle Libre 2 Reader) misc, Scan as needed for continuous glucose monitoring., Disp: 1 each, Rfl: 0   flash glucose sensor (FreeStyle Libre 2 Sensor) kit, Inject 1 sensor to the skin every 14 days for continuous glucose monitoring., Disp: 6 kit, Rfl: 3   fluticasone  propionate (FLONASE ) 50 mcg/spray nasal spray, Administer 2 sprays into each nostril daily as needed for allergies or rhinitis., Disp: , Rfl:    folic acid  (FOLVITE ) 1 mg tablet, Take 3 mg by mouth daily., Disp: , Rfl:    glucose monitoring kit kit, Use as instructed to check BG.  Dx E11.65, Disp: 1 each, Rfl: 0   insulin  aspart U-100 (NovoLOG ) 100 unit/mL injection, Inject 15 Units under the skin 3 (three) times a day before meals., Disp: , Rfl:    insulin  glargine-yfgn 100 unit/mL (3 mL) pen, Inject 40 Units under the skin at bedtime., Disp: , Rfl:    lactulose  (CHRONULAC ) 10 gram/15 mL solution, Take 10 g by mouth 2 (two) times a day as needed (constipation)., Disp: , Rfl:    levothyroxine  (SYNTHROID ) 75 mcg tablet, Take 75 mcg by mouth Once Daily., Disp: 90 tablet, Rfl:  3   metFORMIN (GLUCOPHAGE-XR) 500 mg 24 hr tablet, Take 4 tablets daily (Patient taking differently: 500 mg before breakfast and before evening meal.), Disp: 360 tablet, Rfl: 3   methotrexate  2.5 mg tablet, Take 15 mg by mouth once a week., Disp: , Rfl:    oxyBUTYnin  (DITROPAN  XL) 10 mg 24 hr tablet, Take 10 mg by mouth daily., Disp: , Rfl:    pantoprazole  (PROTONIX ) 40 mg EC tablet, Take 40 mg by mouth daily., Disp: , Rfl:    polyethylene glycol (MIRALAX ) 17 gram powd powder, Take 17 g by mouth daily as needed for constipation., Disp: , Rfl:    predniSONE  (DELTASONE ) 5 mg tablet, Take 5 mg by mouth daily. Takes one tablet of 5mg  with three tablets of 1mg  for a total dose of 8mg  daily., Disp: , Rfl:    QUEtiapine  (SEROquel ) 100 mg tablet, Take 100 mg by mouth as needed in the morning and 100 mg as needed at noon and 100 mg as needed in the evening., Disp: , Rfl:  Repatha  SureClick 140 mg/mL pnij, Inject 140 mg under the skin every 14 (fourteen) days., Disp: , Rfl:    sertraline  (ZOLOFT ) 100 mg tablet, Take 100 mg by mouth daily as needed., Disp: , Rfl:    simethicone (MYLICON) 80 mg chewable tablet, Chew 80 mg daily as needed., Disp: , Rfl:    tadalafiL (CIALIS) 20 mg tablet, Take 1 tablet (20 mg total) as needed for Erectile Dysfunction., Disp: 10 tablet, Rfl: 11   tamsulosin  (FLOMAX ) 0.4 mg cap, Take 0.4 mg by mouth daily., Disp: , Rfl:    torsemide  (DEMADEX ) 20 mg tablet, Take 20 mg by mouth daily., Disp: , Rfl:    triamcinolone  acetonide (KENALOG ) 0.1 % cream, Apply topically daily as needed for rash., Disp: 80 g, Rfl: 2 [3] Allergies Allergen Reactions   Shellfish Containing Products GI Intolerance    Diarrhea, rigors.    Benazepril Cough   Cephalexin  Hives    welts   Gabapentin  Swelling   Hydrocodone Other (See Comments)   Hydrocodone-Acetaminophen  Itching   Tramadol  Swelling   Semaglutide Rash    Diarrhea  "

## 2024-04-21 ENCOUNTER — Ambulatory Visit: Admitting: Internal Medicine

## 2024-04-22 ENCOUNTER — Other Ambulatory Visit: Payer: Self-pay

## 2024-04-22 ENCOUNTER — Emergency Department (HOSPITAL_BASED_OUTPATIENT_CLINIC_OR_DEPARTMENT_OTHER)

## 2024-04-22 ENCOUNTER — Inpatient Hospital Stay (HOSPITAL_BASED_OUTPATIENT_CLINIC_OR_DEPARTMENT_OTHER)
Admission: EM | Admit: 2024-04-22 | Discharge: 2024-04-25 | DRG: 074 | Disposition: A | Discharge status: Home / Self Care | Attending: Internal Medicine | Admitting: Internal Medicine

## 2024-04-22 ENCOUNTER — Encounter (HOSPITAL_BASED_OUTPATIENT_CLINIC_OR_DEPARTMENT_OTHER): Payer: Self-pay | Admitting: Emergency Medicine

## 2024-04-22 DIAGNOSIS — E860 Dehydration: Secondary | ICD-10-CM | POA: Diagnosis present

## 2024-04-22 DIAGNOSIS — K458 Other specified abdominal hernia without obstruction or gangrene: Principal | ICD-10-CM

## 2024-04-22 DIAGNOSIS — K449 Diaphragmatic hernia without obstruction or gangrene: Principal | ICD-10-CM | POA: Diagnosis present

## 2024-04-22 DIAGNOSIS — I2510 Atherosclerotic heart disease of native coronary artery without angina pectoris: Secondary | ICD-10-CM | POA: Diagnosis present

## 2024-04-22 DIAGNOSIS — Z7985 Long-term (current) use of injectable non-insulin antidiabetic drugs: Secondary | ICD-10-CM

## 2024-04-22 DIAGNOSIS — M6080 Other myositis, unspecified site: Secondary | ICD-10-CM | POA: Diagnosis not present

## 2024-04-22 DIAGNOSIS — Z794 Long term (current) use of insulin: Secondary | ICD-10-CM | POA: Diagnosis not present

## 2024-04-22 DIAGNOSIS — L299 Pruritus, unspecified: Secondary | ICD-10-CM | POA: Diagnosis present

## 2024-04-22 DIAGNOSIS — I2489 Other forms of acute ischemic heart disease: Secondary | ICD-10-CM | POA: Diagnosis present

## 2024-04-22 DIAGNOSIS — K5900 Constipation, unspecified: Secondary | ICD-10-CM

## 2024-04-22 DIAGNOSIS — R1013 Epigastric pain: Secondary | ICD-10-CM | POA: Diagnosis present

## 2024-04-22 DIAGNOSIS — Z91013 Allergy to seafood: Secondary | ICD-10-CM

## 2024-04-22 DIAGNOSIS — I5020 Unspecified systolic (congestive) heart failure: Secondary | ICD-10-CM

## 2024-04-22 DIAGNOSIS — Z86718 Personal history of other venous thrombosis and embolism: Secondary | ICD-10-CM

## 2024-04-22 DIAGNOSIS — M069 Rheumatoid arthritis, unspecified: Secondary | ICD-10-CM | POA: Diagnosis present

## 2024-04-22 DIAGNOSIS — Z7984 Long term (current) use of oral hypoglycemic drugs: Secondary | ICD-10-CM | POA: Diagnosis not present

## 2024-04-22 DIAGNOSIS — F4310 Post-traumatic stress disorder, unspecified: Secondary | ICD-10-CM | POA: Diagnosis present

## 2024-04-22 DIAGNOSIS — E785 Hyperlipidemia, unspecified: Secondary | ICD-10-CM | POA: Diagnosis present

## 2024-04-22 DIAGNOSIS — K760 Fatty (change of) liver, not elsewhere classified: Secondary | ICD-10-CM | POA: Diagnosis present

## 2024-04-22 DIAGNOSIS — Z8261 Family history of arthritis: Secondary | ICD-10-CM

## 2024-04-22 DIAGNOSIS — Z86711 Personal history of pulmonary embolism: Secondary | ICD-10-CM

## 2024-04-22 DIAGNOSIS — Z818 Family history of other mental and behavioral disorders: Secondary | ICD-10-CM

## 2024-04-22 DIAGNOSIS — F419 Anxiety disorder, unspecified: Secondary | ICD-10-CM | POA: Diagnosis present

## 2024-04-22 DIAGNOSIS — E1165 Type 2 diabetes mellitus with hyperglycemia: Secondary | ICD-10-CM | POA: Diagnosis present

## 2024-04-22 DIAGNOSIS — E669 Obesity, unspecified: Secondary | ICD-10-CM | POA: Diagnosis present

## 2024-04-22 DIAGNOSIS — F32A Depression, unspecified: Secondary | ICD-10-CM | POA: Diagnosis present

## 2024-04-22 DIAGNOSIS — I5042 Chronic combined systolic (congestive) and diastolic (congestive) heart failure: Secondary | ICD-10-CM | POA: Diagnosis present

## 2024-04-22 DIAGNOSIS — Z888 Allergy status to other drugs, medicaments and biological substances status: Secondary | ICD-10-CM

## 2024-04-22 DIAGNOSIS — E039 Hypothyroidism, unspecified: Secondary | ICD-10-CM | POA: Diagnosis present

## 2024-04-22 DIAGNOSIS — Z881 Allergy status to other antibiotic agents status: Secondary | ICD-10-CM

## 2024-04-22 DIAGNOSIS — N1831 Chronic kidney disease, stage 3a: Secondary | ICD-10-CM | POA: Diagnosis present

## 2024-04-22 DIAGNOSIS — Z885 Allergy status to narcotic agent status: Secondary | ICD-10-CM

## 2024-04-22 DIAGNOSIS — D631 Anemia in chronic kidney disease: Secondary | ICD-10-CM | POA: Diagnosis present

## 2024-04-22 DIAGNOSIS — E1143 Type 2 diabetes mellitus with diabetic autonomic (poly)neuropathy: Principal | ICD-10-CM | POA: Diagnosis present

## 2024-04-22 DIAGNOSIS — K5909 Other constipation: Secondary | ICD-10-CM | POA: Diagnosis present

## 2024-04-22 DIAGNOSIS — Z79631 Long term (current) use of antimetabolite agent: Secondary | ICD-10-CM

## 2024-04-22 DIAGNOSIS — I130 Hypertensive heart and chronic kidney disease with heart failure and stage 1 through stage 4 chronic kidney disease, or unspecified chronic kidney disease: Secondary | ICD-10-CM | POA: Diagnosis present

## 2024-04-22 DIAGNOSIS — T402X5A Adverse effect of other opioids, initial encounter: Secondary | ICD-10-CM | POA: Diagnosis present

## 2024-04-22 DIAGNOSIS — I890 Lymphedema, not elsewhere classified: Secondary | ICD-10-CM | POA: Diagnosis present

## 2024-04-22 DIAGNOSIS — R112 Nausea with vomiting, unspecified: Secondary | ICD-10-CM | POA: Diagnosis not present

## 2024-04-22 DIAGNOSIS — Z7901 Long term (current) use of anticoagulants: Secondary | ICD-10-CM | POA: Diagnosis not present

## 2024-04-22 DIAGNOSIS — R7989 Other specified abnormal findings of blood chemistry: Secondary | ICD-10-CM

## 2024-04-22 DIAGNOSIS — Z833 Family history of diabetes mellitus: Secondary | ICD-10-CM

## 2024-04-22 DIAGNOSIS — K567 Ileus, unspecified: Secondary | ICD-10-CM | POA: Diagnosis present

## 2024-04-22 DIAGNOSIS — Z8249 Family history of ischemic heart disease and other diseases of the circulatory system: Secondary | ICD-10-CM

## 2024-04-22 DIAGNOSIS — E1122 Type 2 diabetes mellitus with diabetic chronic kidney disease: Secondary | ICD-10-CM | POA: Diagnosis present

## 2024-04-22 DIAGNOSIS — Z79899 Other long term (current) drug therapy: Secondary | ICD-10-CM

## 2024-04-22 DIAGNOSIS — N400 Enlarged prostate without lower urinary tract symptoms: Secondary | ICD-10-CM | POA: Diagnosis present

## 2024-04-22 DIAGNOSIS — Z7952 Long term (current) use of systemic steroids: Secondary | ICD-10-CM

## 2024-04-22 DIAGNOSIS — K3184 Gastroparesis: Secondary | ICD-10-CM | POA: Diagnosis present

## 2024-04-22 LAB — CBC
HCT: 28.6 % — ABNORMAL LOW (ref 39.0–52.0)
Hemoglobin: 9.6 g/dL — ABNORMAL LOW (ref 13.0–17.0)
MCH: 31.5 pg (ref 26.0–34.0)
MCHC: 33.6 g/dL (ref 30.0–36.0)
MCV: 93.8 fL (ref 80.0–100.0)
Platelets: 281 K/uL (ref 150–400)
RBC: 3.05 MIL/uL — ABNORMAL LOW (ref 4.22–5.81)
RDW: 14.4 % (ref 11.5–15.5)
WBC: 7.8 K/uL (ref 4.0–10.5)
nRBC: 1.4 % — ABNORMAL HIGH (ref 0.0–0.2)

## 2024-04-22 LAB — TROPONIN T, HIGH SENSITIVITY
Troponin T High Sensitivity: 124 ng/L (ref 0–19)
Troponin T High Sensitivity: 133 ng/L (ref 0–19)

## 2024-04-22 LAB — COMPREHENSIVE METABOLIC PANEL WITH GFR
ALT: 16 U/L (ref 0–44)
AST: 25 U/L (ref 15–41)
Albumin: 4 g/dL (ref 3.5–5.0)
Alkaline Phosphatase: 64 U/L (ref 38–126)
Anion gap: 12 (ref 5–15)
BUN: 12 mg/dL (ref 8–23)
CO2: 27 mmol/L (ref 22–32)
Calcium: 10.1 mg/dL (ref 8.9–10.3)
Chloride: 101 mmol/L (ref 98–111)
Creatinine, Ser: 1.36 mg/dL — ABNORMAL HIGH (ref 0.61–1.24)
GFR, Estimated: 54 mL/min — ABNORMAL LOW
Glucose, Bld: 139 mg/dL — ABNORMAL HIGH (ref 70–99)
Potassium: 4.3 mmol/L (ref 3.5–5.1)
Sodium: 140 mmol/L (ref 135–145)
Total Bilirubin: 0.7 mg/dL (ref 0.0–1.2)
Total Protein: 6.2 g/dL — ABNORMAL LOW (ref 6.5–8.1)

## 2024-04-22 LAB — URINALYSIS, ROUTINE W REFLEX MICROSCOPIC
Bilirubin Urine: NEGATIVE
Glucose, UA: NEGATIVE mg/dL
Hgb urine dipstick: NEGATIVE
Ketones, ur: NEGATIVE mg/dL
Leukocytes,Ua: NEGATIVE
Nitrite: NEGATIVE
Protein, ur: NEGATIVE mg/dL
Specific Gravity, Urine: 1.015 (ref 1.005–1.030)
pH: 8 (ref 5.0–8.0)

## 2024-04-22 LAB — CBG MONITORING, ED: Glucose-Capillary: 155 mg/dL — ABNORMAL HIGH (ref 70–99)

## 2024-04-22 LAB — LIPASE, BLOOD: Lipase: <10 U/L — ABNORMAL LOW (ref 11–51)

## 2024-04-22 MED ORDER — FOLIC ACID 1 MG PO TABS
2.0000 mg | ORAL_TABLET | Freq: Every day | ORAL | Status: DC
  Administered 2024-04-23 – 2024-04-25 (×3): 2 mg via ORAL
  Filled 2024-04-22 (×3): qty 2

## 2024-04-22 MED ORDER — DULOXETINE HCL 30 MG PO CPEP
30.0000 mg | ORAL_CAPSULE | Freq: Every day | ORAL | Status: DC
  Administered 2024-04-23 – 2024-04-25 (×3): 30 mg via ORAL
  Filled 2024-04-22 (×3): qty 1

## 2024-04-22 MED ORDER — MORPHINE SULFATE (PF) 4 MG/ML IV SOLN
4.0000 mg | Freq: Once | INTRAVENOUS | Status: AC
  Administered 2024-04-22: 4 mg via INTRAVENOUS
  Filled 2024-04-22: qty 1

## 2024-04-22 MED ORDER — ONDANSETRON HCL 4 MG/2ML IJ SOLN
4.0000 mg | Freq: Once | INTRAMUSCULAR | Status: AC
  Administered 2024-04-22: 4 mg via INTRAVENOUS
  Filled 2024-04-22: qty 2

## 2024-04-22 MED ORDER — BISACODYL 5 MG PO TBEC: 5.0000 mg | DELAYED_RELEASE_TABLET | Freq: Every day | ORAL | Status: DC | PRN

## 2024-04-22 MED ORDER — SENNOSIDES-DOCUSATE SODIUM 8.6-50 MG PO TABS: 1.0000 | ORAL_TABLET | Freq: Every evening | ORAL | Status: DC | PRN

## 2024-04-22 MED ORDER — PREDNISONE 5 MG PO TABS
8.0000 mg | ORAL_TABLET | Freq: Every day | ORAL | Status: DC
  Administered 2024-04-23 – 2024-04-25 (×3): 8 mg via ORAL
  Filled 2024-04-22 (×4): qty 3

## 2024-04-22 MED ORDER — BACLOFEN 10 MG PO TABS
20.0000 mg | ORAL_TABLET | Freq: Two times a day (BID) | ORAL | Status: DC
  Administered 2024-04-25: 20 mg via ORAL
  Filled 2024-04-22 (×5): qty 2

## 2024-04-22 MED ORDER — TAMSULOSIN HCL 0.4 MG PO CAPS
0.4000 mg | ORAL_CAPSULE | Freq: Every day | ORAL | Status: DC
  Administered 2024-04-23 – 2024-04-25 (×3): 0.4 mg via ORAL
  Filled 2024-04-22 (×3): qty 1

## 2024-04-22 MED ORDER — ACETAMINOPHEN 650 MG RE SUPP: 650.0000 mg | Freq: Four times a day (QID) | RECTAL | Status: DC | PRN

## 2024-04-22 MED ORDER — INSULIN ASPART 100 UNIT/ML IJ SOLN
0.0000 [IU] | Freq: Three times a day (TID) | INTRAMUSCULAR | Status: DC
  Administered 2024-04-23: 5 [IU] via SUBCUTANEOUS
  Administered 2024-04-24: 3 [IU] via SUBCUTANEOUS
  Administered 2024-04-24: 2 [IU] via SUBCUTANEOUS
  Administered 2024-04-24: 5 [IU] via SUBCUTANEOUS
  Administered 2024-04-25: 8 [IU] via SUBCUTANEOUS
  Administered 2024-04-25: 3 [IU] via SUBCUTANEOUS
  Filled 2024-04-22: qty 2
  Filled 2024-04-22: qty 5
  Filled 2024-04-22: qty 3
  Filled 2024-04-22: qty 2
  Filled 2024-04-22: qty 8
  Filled 2024-04-22: qty 5

## 2024-04-22 MED ORDER — LEVOTHYROXINE SODIUM 75 MCG PO TABS
75.0000 ug | ORAL_TABLET | Freq: Every day | ORAL | Status: DC
  Administered 2024-04-23 – 2024-04-25 (×3): 75 ug via ORAL
  Filled 2024-04-22 (×2): qty 3
  Filled 2024-04-22: qty 1

## 2024-04-22 MED ORDER — IOHEXOL 300 MG/ML  SOLN
100.0000 mL | Freq: Once | INTRAMUSCULAR | Status: AC | PRN
  Administered 2024-04-22: 100 mL via INTRAVENOUS

## 2024-04-22 MED ORDER — DIPHENHYDRAMINE HCL 50 MG/ML IJ SOLN
25.0000 mg | Freq: Three times a day (TID) | INTRAMUSCULAR | Status: DC | PRN
  Administered 2024-04-23 (×2): 25 mg via INTRAVENOUS
  Filled 2024-04-22 (×2): qty 1

## 2024-04-22 MED ORDER — ONDANSETRON HCL 4 MG/2ML IJ SOLN: 4.0000 mg | Freq: Four times a day (QID) | INTRAMUSCULAR | Status: DC | PRN

## 2024-04-22 MED ORDER — DOCUSATE SODIUM 50 MG/5ML PO LIQD
50.0000 mg | Freq: Every day | ORAL | Status: DC
  Administered 2024-04-23 – 2024-04-25 (×3): 50 mg via ORAL
  Filled 2024-04-22 (×3): qty 10

## 2024-04-22 MED ORDER — QUETIAPINE FUMARATE 50 MG PO TABS
50.0000 mg | ORAL_TABLET | Freq: Every day | ORAL | Status: DC
  Administered 2024-04-25: 50 mg via ORAL
  Filled 2024-04-22 (×4): qty 1

## 2024-04-22 MED ORDER — LATANOPROST 0.005 % OP SOLN
1.0000 [drp] | Freq: Every day | OPHTHALMIC | Status: DC
  Administered 2024-04-23 – 2024-04-24 (×2): 1 [drp] via OPHTHALMIC
  Filled 2024-04-22 (×2): qty 2.5

## 2024-04-22 MED ORDER — ACETAMINOPHEN 325 MG PO TABS: 650.0000 mg | ORAL_TABLET | Freq: Four times a day (QID) | ORAL | Status: DC | PRN

## 2024-04-22 MED ORDER — CALCIUM CARBONATE 1250 (500 CA) MG PO TABS
1250.0000 mg | ORAL_TABLET | Freq: Every day | ORAL | Status: DC
  Administered 2024-04-23 – 2024-04-25 (×3): 1250 mg via ORAL
  Filled 2024-04-22 (×3): qty 1

## 2024-04-22 MED ORDER — DIPHENHYDRAMINE HCL 50 MG/ML IJ SOLN
12.5000 mg | Freq: Once | INTRAMUSCULAR | Status: AC
  Administered 2024-04-22: 12.5 mg via INTRAVENOUS
  Filled 2024-04-22: qty 1

## 2024-04-22 MED ORDER — INSULIN ASPART 100 UNIT/ML IJ SOLN: 0.0000 [IU] | Freq: Every day | INTRAMUSCULAR | Status: DC

## 2024-04-22 MED ORDER — LORATADINE 10 MG PO TABS
10.0000 mg | ORAL_TABLET | Freq: Every day | ORAL | Status: DC
  Administered 2024-04-23 – 2024-04-25 (×3): 10 mg via ORAL
  Filled 2024-04-22 (×3): qty 1

## 2024-04-22 MED ORDER — OXYBUTYNIN CHLORIDE 5 MG PO TABS
5.0000 mg | ORAL_TABLET | Freq: Three times a day (TID) | ORAL | Status: DC
  Administered 2024-04-23 – 2024-04-25 (×7): 5 mg via ORAL
  Filled 2024-04-22 (×8): qty 1

## 2024-04-22 MED ORDER — SODIUM CHLORIDE 0.9 % IV SOLN: INTRAVENOUS | Status: AC

## 2024-04-22 MED ORDER — LACTULOSE 10 GM/15ML PO SOLN
10.0000 g | Freq: Three times a day (TID) | ORAL | Status: DC
  Administered 2024-04-23 – 2024-04-24 (×3): 10 g via ORAL
  Filled 2024-04-22 (×5): qty 15

## 2024-04-22 MED ORDER — SENNOSIDES-DOCUSATE SODIUM 8.6-50 MG PO TABS
2.0000 | ORAL_TABLET | Freq: Every day | ORAL | Status: DC
  Administered 2024-04-23 – 2024-04-24 (×2): 2 via ORAL
  Filled 2024-04-22 (×3): qty 2

## 2024-04-22 MED ORDER — ONDANSETRON HCL 4 MG PO TABS: 4.0000 mg | ORAL_TABLET | Freq: Four times a day (QID) | ORAL | Status: DC | PRN

## 2024-04-22 MED ORDER — BISACODYL 10 MG RE SUPP
10.0000 mg | Freq: Once | RECTAL | Status: DC
  Filled 2024-04-22: qty 1

## 2024-04-22 MED ORDER — POLYETHYLENE GLYCOL 3350 17 G PO PACK
17.0000 g | PACK | Freq: Three times a day (TID) | ORAL | Status: DC | PRN
  Administered 2024-04-24: 17 g via ORAL
  Filled 2024-04-22: qty 1

## 2024-04-22 MED ORDER — DIPHENHYDRAMINE HCL 50 MG/ML IJ SOLN
25.0000 mg | Freq: Once | INTRAMUSCULAR | Status: AC
  Administered 2024-04-22: 25 mg via INTRAVENOUS
  Filled 2024-04-22: qty 1

## 2024-04-22 NOTE — ED Provider Notes (Incomplete)
 " [place] [facility] Provider Note   CSN: [id number] Arrival date & time: 04/22/24  1310     Patient presents with: Abdominal Pain and Emesis   [name] is a 73 y.o. male.  {Add pertinent medical, surgical, social history, OB history to HPI:32947} HPI      73 year old male with a history of DVT, PE, hypothyroidism, and type 2 diabetes, chronic heart failure, who presents with concern for epigastric abdominal pain.  Vomiting everything every day, even smelling food makes him nauseas  For at least one month or so but getting worse Sometimes pain will be severe and then gets better, will be constant but waxing and waning, 2AM was severe pain. At 4 or 5AM vomited twice, very severe pain, has improved some since then Ate 2 cookies today A lot of pain, taking tums, alka seltzer, kept putting off coming to the ED Top part of abdomen, the moves to chest then to the bottom of abdomen and then to the sides, sometimes the whole thing, all the qualities of pain, burning , cramping, sharp Constipation,  last BM was yestery, sometimes going days without one.  Had one yesterday after suppository, dulcolax Taking colace, lactulose , miralax  still doesn't help No fevers No urinary symptoms  Not able to eat much, not much to drink today Still taking eliquis , 2 different insulins, not eating enough to take the fast acting insulin     Does not have gastroenterologist--had colonoscopy about 10 years ago, was supposed to be jan 20th, US  of stomach scheduled for the 5th.  Going to [place] for that  Past Medical History:  Diagnosis Date   Atopic dermatitis 12/09/2022   Autoimmune disease    Bronchitis    Chronic HFrEF (heart failure with reduced ejection fraction) (HCC)    normalized EF   Chronic hiccups    Chronic kidney disease, stage 3a (HCC)    DM2 (diabetes mellitus, type 2) (HCC) 09/28/2014   DVT (deep venous thrombosis) (HCC)    GERD  (gastroesophageal reflux disease)    Hypogonadism in male 04/01/2018   Hypothyroidism    Joint pain    Necrotizing myopathy    Nonobstructive atherosclerosis of coronary artery    Posttraumatic stress disorder 01/30/2022   PTSD (post-traumatic stress disorder)    Pulmonary embolism (HCC)    Rheumatoid arthritis (HCC) 08/09/2021   RSV (respiratory syncytial virus pneumonia) 03/11/2022    Past Surgical History:  Procedure Laterality Date   BIOPSY SHOULDER     Left   IR ANGIOGRAM PULMONARY BILATERAL SELECTIVE  07/22/2021   IR THROMBECT VENO MECH MOD SED  07/22/2021   IR US  GUIDE VASC ACCESS LEFT  07/22/2021   RADIOLOGY WITH ANESTHESIA Right 07/22/2021   Procedure: IR WITH ANESTHESIA;  Surgeon: Radiologist, Medication, MD;  Location: [facility] OR;  Service: Radiology;  Laterality: Right;   UPPER LEG SOFT TISSUE BIOPSY Right     Prior to Admission medications  Medication Sig Start Date End Date Taking? Authorizing Provider  acetaminophen  (TYLENOL ) 500 MG tablet Take 500-1,000 mg by mouth See admin instructions. Take 1000 mg by mouth in the morning and 500 mg at bedtime    [provider]  AMBULATORY NON FORMULARY MEDICATION Please provide 3 in 1 bedside commode.  Dx:R29.6, M62.81 04/16/22   Alvia Bring, DO  AMBULATORY NON FORMULARY MEDICATION Please provide bilateral thigh high compression stockings: Graded compression of 20-69mmhg 09/05/22   Alvia Bring, DO  amitriptyline  (ELAVIL ) 25 MG tablet Take 1  tab po qhs Patient not taking: Reported on 04/17/2024 03/13/23   Alvia Bring, DO  apixaban  (ELIQUIS ) 5 MG TABS tablet Take 1 tablet (5 mg total) by mouth 2 (two) times daily. 08/09/22   Vannie Reche RAMAN, NP  baclofen  (LIORESAL ) 20 MG tablet TAKE 1 TABLET BY MOUTH TWICE A DAY 01/30/22   Alvia Bring, DO  bisacodyl  (DULCOLAX) 10 MG suppository Place 1 suppository (10 mg total) rectally as needed for moderate constipation. 08/20/23   Francesca Elsie CROME, MD  calcium  carbonate (OS-CAL) 600  MG TABS tablet Take 600 mg by mouth daily.    [provider]  Calcium  Carbonate Antacid (TUMS PO) Take 3 tablets by mouth 2 (two) times daily as needed (heartburn).    [provider]  cetirizine  (ZYRTEC ) 10 MG tablet Take 1 tablet (10 mg total) by mouth daily. 01/02/24   Maranda Jamee Jacob, MD  ciclopirox  (LOPROX ) 0.77 % cream Apply topically. 02/12/24   [provider]  docusate (COLACE) 50 MG/5ML liquid Take 50 mg by mouth daily.    [provider]  DULoxetine  (CYMBALTA ) 30 MG capsule Take 30 mg by mouth daily.    [provider]  EPINEPHrine  0.3 mg/0.3 mL IJ SOAJ injection Inject 0.3 mg into the muscle as needed for anaphylaxis. 12/26/23   Tegeler, Lonni PARAS, MD  Evolocumab  (REPATHA  SURECLICK) 140 MG/ML SOAJ Inject 140 mg into the skin every 14 (fourteen) days. 06/25/23   Walker, Caitlin S, NP  Evolocumab  (REPATHA  SURECLICK) 140 MG/ML SOAJ Inject 140 mg into the skin every 14 (fourteen) days. 10/09/23   Daneen Damien BROCKS, NP  folic acid  (FOLVITE ) 1 MG tablet Take 2 mg by mouth daily.    [provider]  hydrOXYzine  (ATARAX ) 25 MG tablet Take 1 tablet (25 mg total) by mouth every 6 (six) hours as needed. Patient not taking: Reported on 04/17/2024 01/02/24   Maranda Jamee Jacob, MD  insulin  aspart (NOVOLOG  FLEXPEN) 100 UNIT/ML FlexPen Inject 15 Units into the skin 3 (three) times daily with meals.    [provider]  insulin  glargine (LANTUS ) 100 UNIT/ML injection Inject 0.38 mLs (38 Units total) into the skin daily. 09/15/22   Cheryle Page, MD  lactulose  (CHRONULAC ) 10 GM/15ML solution Take 15 mLs (10 g total) by mouth 3 (three) times daily. 08/20/23   Francesca Elsie CROME, MD  latanoprost  (XALATAN ) 0.005 % ophthalmic solution 1 drop at bedtime. 08/17/22   [provider]  Levothyroxine  Sodium 75 MCG CAPS Take 75 mcg by mouth daily.    [provider]  metFORMIN (GLUCOPHAGE-XR) 500 MG 24 hr tablet Take 500 mg by mouth 2  (two) times daily with a meal. 02/12/24   [provider]  methotrexate  (RHEUMATREX) 5 MG tablet Take 3 tablets by mouth once a week. Caution: Chemotherapy. Protect from light. Patient taking differently: Take 2.5 mg by mouth once a week. Caution: Chemotherapy. Protect from light.  Takes 6 tablets (15 mg total) by mouth every 7 days    [provider]  mirabegron  ER (MYRBETRIQ ) 50 MG TB24 tablet Take 1 tablet (50 mg total) by mouth daily. Patient not taking: Reported on 04/17/2024 03/13/23   Alvia Bring, DO  Multiple Vitamins-Minerals (PRESERVISION AREDS 2) CAPS Take 2 caps po daily Patient not taking: Reported on 04/17/2024 12/05/22   Alvia Bring, DO  NON FORMULARY Apply 500 mg topically daily.    [provider]  omeprazole (PRILOSEC) 20 MG capsule Take 20 mg by mouth daily.    [provider]  oxybutynin  (DITROPAN ) 5 MG tablet Take 5 mg by mouth 3 (three) times daily.    [provider]  polyethylene glycol powder (MIRALAX ) 17 GM/SCOOP powder Take 17 g by mouth in the morning, at noon, and at bedtime. Take TID until having regular bowel movement, then QD 08/20/23   Francesca Elsie CROME, MD  polyvinyl alcohol  (LIQUIFILM TEARS) 1.4 % ophthalmic solution Place 1 drop into both eyes in the morning and at bedtime.    [provider]  PREDNISONE  PO Take 8 mg by mouth daily.    [provider]  QUEtiapine  (SEROQUEL ) 50 MG tablet Take by mouth as directed. 09/21/22   [provider]  Semaglutide,0.25 or 0.5MG /DOS, 2 MG/3ML SOPN Inject 0.25 mLs into the skin once a week. Patient not taking: Reported on 04/17/2024 01/17/23   [provider]  sertraline  (ZOLOFT ) 25 MG tablet Take 2 tablets (50 mg total) by mouth daily. Take 50mg  daily. Patient taking differently: Take 50 mg by mouth daily. Take 50mg  daily. Taking as needed 09/15/22   Cheryle Page, MD  SUMAtriptan (IMITREX) 100 MG tablet TAKE ONE TABLET BY MOUTH AS DIRECTED  FOR MIGRAINE HEADACHE (FIRST DOSE AT ONSET OF HEADACHE,THEN REPEAT AFTER 2 HOURS IF NO RELIEF-NO MORE THAN 200 MG PER DAY) 03/20/24   [provider]  tamsulosin  (FLOMAX ) 0.4 MG CAPS capsule Take by mouth. 09/21/22   [provider]  torsemide  (DEMADEX ) 20 MG tablet Take 1 tablet (20 mg total) by mouth daily. May take additional tablet as directed by lymphedema clinic or cardiologist. 11/11/23   Daneen Damien BROCKS, NP  triamcinolone  (KENALOG ) 0.1 % paste Apply topically. Patient not taking: Reported on 04/17/2024 09/21/22   [provider]  triamcinolone  cream (KENALOG ) 0.1 % Apply 1 Application topically 2 (two) times daily. Apply bid as needed for rash Patient not taking: Reported on 04/17/2024 12/05/22   Alvia Bring, DO    Allergies: Doxycycline , Hydroxyzine , Empagliflozin, Semaglutide, Benazepril, Cephalexin , Gabapentin , Hydrocodone, and Shellfish allergy    Review of Systems  Updated Vital Signs BP 128/69   Pulse (!) 110   Temp 97.9 F (36.6 C)   Resp 20   Ht 5' 8 (1.727 m)   Wt 96.6 kg   SpO2 100%   BMI 32.39 kg/m   Physical Exam  (all labs ordered are listed, but only abnormal results are displayed) Labs Reviewed  LIPASE, BLOOD - Abnormal; Notable for the following components:      Result Value   Lipase <10 (*)    All other components within normal limits  COMPREHENSIVE METABOLIC PANEL WITH GFR - Abnormal; Notable for the following components:   Glucose, Bld 139 (*)    Creatinine, Ser 1.36 (*)    Total Protein 6.2 (*)    GFR, Estimated 54 (*)    All other components within normal limits  CBC - Abnormal; Notable for the following components:   RBC 3.05 (*)    Hemoglobin 9.6 (*)    HCT 28.6 (*)    nRBC 1.4 (*)    All other components within normal limits  URINALYSIS, ROUTINE W REFLEX MICROSCOPIC    EKG: None  Radiology: No results found.  {Document cardiac monitor, telemetry assessment procedure when appropriate:32947} Procedures    Medications Ordered in the ED - No data to display    {Click here for ABCD2, HEART and other calculators REFRESH Note before signing:1}  Medical Decision Making Amount and/or Complexity of Data Reviewed Labs: ordered. Radiology: ordered.  Risk Prescription drug management.   ***  {Document critical care time when appropriate  Document review of labs and clinical decision tools ie CHADS2VASC2, etc  Document your independent review of radiology images and any outside records  Document your discussion with family members, caretakers and with consultants  Document social determinants of health affecting pt's care  Document your decision making why or why not admission, treatments were needed:32947:::1}   Final diagnoses:  None    ED Discharge Orders     None        "

## 2024-04-22 NOTE — H&P (Incomplete)
 " History and Physical  Nachum Schiano FMW:[id number] DOB: 08-Feb-1951 DOA: 04/22/2024  PCP: Alvia Bring, DO   Chief Complaint: Epigastric pain  HPI: Clebert Borba is a 73 y.o. 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 Per spouse, patient has had abdominal pain on and off for over 1 month. Patient describes the pain as crampy and and occasionally sharp. Patient has also had persistent constipation, often requiring suppository and laxatives to move his bowels over the last few weeks. Patient has had associated intractable nausea and vomiting with even the smell of food often making him nauseous. He has not been able to keep much food down but was able to eat 2 cookies today.  He denies any headaches, fevers, chills, chest pain or shortness of breath.  Patient also endorsed history of severe pruritus from medications he is allergic and after receiving morphine  in the ED, he is continuing to have uncontrollable itching. Spouse reports that patient is scheduled to have an abdominal ultrasound, endoscopy and colonoscopy in January for further evaluation of his abdominal symptoms.  ED Course: Initial vitals show afebrile, 16-29, HR 100-130s, SBP 120-140s, SpO2 100% on room air. Initial labs significant for creatinine 1.36, WBC 7.8, Hgb 9.6, troponin 124-133, UA with no signs of infection. EKG shows sinus tach. CT A/P shows findings suspicious for internal hernia, moderate colonic stool burden and hepatic steatosis. Pt received IV Zofran , multiple doses of IV morphine  and IV Benadryl . General surgery and cardiology were consulted for evaluation. Patient was  transferred to [facility], ED and [facility] was consulted for admission.  Review of Systems: Please see HPI for pertinent positives and negatives. A complete 10 system review of systems are otherwise negative.  Past Medical History:  Diagnosis Date   Atopic dermatitis 12/09/2022   Autoimmune disease    Bronchitis    Chronic HFrEF (heart failure with reduced ejection fraction) (HCC)    normalized EF   Chronic hiccups    Chronic kidney disease, stage 3a (HCC)    DM2 (diabetes mellitus, type 2) (HCC) 09/28/2014   DVT (deep venous thrombosis) (HCC)    GERD (gastroesophageal reflux disease)    Hypogonadism in male 04/01/2018   Hypothyroidism    Joint pain    Necrotizing myopathy    Nonobstructive atherosclerosis of coronary artery    Posttraumatic stress disorder 01/30/2022   PTSD (post-traumatic stress disorder)    Pulmonary embolism (HCC)    Rheumatoid arthritis (HCC) 08/09/2021   RSV (respiratory syncytial virus pneumonia) 03/11/2022   Past Surgical History:  Procedure Laterality Date   BIOPSY SHOULDER     Left   IR ANGIOGRAM PULMONARY BILATERAL SELECTIVE  07/22/2021   IR THROMBECT VENO MECH MOD SED  07/22/2021   IR US  GUIDE VASC ACCESS LEFT  07/22/2021   RADIOLOGY WITH ANESTHESIA Right 07/22/2021   Procedure: IR WITH ANESTHESIA;  Surgeon: Radiologist, Medication, MD;  Location: [facility] OR;  Service: Radiology;  Laterality: Right;   UPPER LEG SOFT TISSUE BIOPSY Right    Social History:  reports that he has never smoked. He has never used smokeless tobacco. He reports that he does not drink alcohol  and does not use drugs.  Allergies[1]  Family History  Problem Relation Age of  Onset   Hypertension Mother    Diabetes Mother    Arthritis Mother    Depression Mother    Heart attack Father    Hypertension Father      Prior to Admission medications  Medication Sig Start Date End Date Taking? Authorizing Provider  acetaminophen  (TYLENOL ) 500 MG tablet Take 500-1,000 mg by mouth See  admin instructions. Take 1000 mg by mouth in the morning and 500 mg at bedtime    [provider]  AMBULATORY NON FORMULARY MEDICATION Please provide 3 in 1 bedside commode.  Dx:R29.6, M62.81 04/16/22   Alvia Bring, DO  AMBULATORY NON FORMULARY MEDICATION Please provide bilateral thigh high compression stockings: Graded compression of 20-11mmhg 09/05/22   Alvia Bring, DO  amitriptyline  (ELAVIL ) 25 MG tablet Take 1 tab po qhs Patient not taking: Reported on 04/17/2024 03/13/23   Alvia Bring, DO  apixaban  (ELIQUIS ) 5 MG TABS tablet Take 1 tablet (5 mg total) by mouth 2 (two) times daily. 08/09/22   Vannie Reche RAMAN, NP  baclofen  (LIORESAL ) 20 MG tablet TAKE 1 TABLET BY MOUTH TWICE A DAY 01/30/22   Alvia Bring, DO  bisacodyl  (DULCOLAX) 10 MG suppository Place 1 suppository (10 mg total) rectally as needed for moderate constipation. 08/20/23   Francesca Elsie CROME, MD  calcium  carbonate (OS-CAL) 600 MG TABS tablet Take 600 mg by mouth daily.    [provider]  Calcium  Carbonate Antacid (TUMS PO) Take 3 tablets by mouth 2 (two) times daily as needed (heartburn).    [provider]  cetirizine  (ZYRTEC ) 10 MG tablet Take 1 tablet (10 mg total) by mouth daily. 01/02/24   Maranda Jamee Jacob, MD  ciclopirox  (LOPROX ) 0.77 % cream Apply topically. 02/12/24   [provider]  docusate (COLACE) 50 MG/5ML liquid Take 50 mg by mouth daily.    [provider]  DULoxetine  (CYMBALTA ) 30 MG capsule Take 30 mg by mouth daily.    [provider]  EPINEPHrine  0.3 mg/0.3 mL IJ SOAJ injection Inject 0.3 mg into the muscle as needed for anaphylaxis. 12/26/23   Tegeler, Lonni PARAS, MD  Evolocumab  (REPATHA  SURECLICK) 140 MG/ML SOAJ Inject 140 mg into the skin every 14 (fourteen) days. 06/25/23   Walker, Caitlin S, NP  Evolocumab  (REPATHA  SURECLICK) 140 MG/ML SOAJ Inject 140 mg into the skin every 14 (fourteen) days. 10/09/23   Daneen Damien BROCKS, NP  folic acid  (FOLVITE )  1 MG tablet Take 2 mg by mouth daily.    [provider]  hydrOXYzine  (ATARAX ) 25 MG tablet Take 1 tablet (25 mg total) by mouth every 6 (six) hours as needed. Patient not taking: Reported on 04/17/2024 01/02/24   Maranda Jamee Jacob, MD  insulin  aspart (NOVOLOG  FLEXPEN) 100 UNIT/ML FlexPen Inject 15 Units into the skin 3 (three) times daily with meals.    [provider]  insulin  glargine (LANTUS ) 100 UNIT/ML injection Inject 0.38 mLs (38 Units total) into the skin daily. 09/15/22   Cheryle Page, MD  lactulose  (CHRONULAC ) 10 GM/15ML solution Take 15 mLs (10 g total) by mouth 3 (three) times daily. 08/20/23   Francesca Elsie CROME, MD  latanoprost  (XALATAN ) 0.005 % ophthalmic solution 1 drop at bedtime. 08/17/22   [provider]  Levothyroxine  Sodium 75 MCG CAPS Take 75 mcg by mouth daily.    [provider]  metFORMIN (GLUCOPHAGE-XR) 500 MG 24 hr tablet Take 500 mg by mouth 2 (two) times daily with a meal. 02/12/24   [provider]  methotrexate  (  RHEUMATREX) 5 MG tablet Take 3 tablets by mouth once a week. Caution: Chemotherapy. Protect from light. Patient taking differently: Take 2.5 mg by mouth once a week. Caution: Chemotherapy. Protect from light.  Takes 6 tablets (15 mg total) by mouth every 7 days    [provider]  mirabegron  ER (MYRBETRIQ ) 50 MG TB24 tablet Take 1 tablet (50 mg total) by mouth daily. Patient not taking: Reported on 04/17/2024 03/13/23   Alvia Bring, DO  Multiple Vitamins-Minerals (PRESERVISION AREDS 2) CAPS Take 2 caps po daily Patient not taking: Reported on 04/17/2024 12/05/22   Alvia Bring, DO  NON FORMULARY Apply 500 mg topically daily.    [provider]  omeprazole (PRILOSEC) 20 MG capsule Take 20 mg by mouth daily.    [provider]  oxybutynin  (DITROPAN ) 5 MG tablet Take 5 mg by mouth 3 (three) times daily.    [provider]  polyethylene glycol powder (MIRALAX ) 17 GM/SCOOP powder  Take 17 g by mouth in the morning, at noon, and at bedtime. Take TID until having regular bowel movement, then QD 08/20/23   Francesca Elsie CROME, MD  polyvinyl alcohol  (LIQUIFILM TEARS) 1.4 % ophthalmic solution Place 1 drop into both eyes in the morning and at bedtime.    [provider]  PREDNISONE  PO Take 8 mg by mouth daily.    [provider]  QUEtiapine  (SEROQUEL ) 50 MG tablet Take by mouth as directed. 09/21/22   [provider]  Semaglutide,0.25 or 0.5MG /DOS, 2 MG/3ML SOPN Inject 0.25 mLs into the skin once a week. Patient not taking: Reported on 04/17/2024 01/17/23   [provider]  sertraline  (ZOLOFT ) 25 MG tablet Take 2 tablets (50 mg total) by mouth daily. Take 50mg  daily. Patient taking differently: Take 50 mg by mouth daily. Take 50mg  daily. Taking as needed 09/15/22   Cheryle Page, MD  SUMAtriptan (IMITREX) 100 MG tablet TAKE ONE TABLET BY MOUTH AS DIRECTED FOR MIGRAINE HEADACHE (FIRST DOSE AT ONSET OF HEADACHE,THEN REPEAT AFTER 2 HOURS IF NO RELIEF-NO MORE THAN 200 MG PER DAY) 03/20/24   [provider]  tamsulosin  (FLOMAX ) 0.4 MG CAPS capsule Take by mouth. 09/21/22   [provider]  torsemide  (DEMADEX ) 20 MG tablet Take 1 tablet (20 mg total) by mouth daily. May take additional tablet as directed by lymphedema clinic or cardiologist. 11/11/23   Daneen Damien BROCKS, NP  triamcinolone  (KENALOG ) 0.1 % paste Apply topically. Patient not taking: Reported on 04/17/2024 09/21/22   [provider]  triamcinolone  cream (KENALOG ) 0.1 % Apply 1 Application topically 2 (two) times daily. Apply bid as needed for rash Patient not taking: Reported on 04/17/2024 12/05/22   Alvia Bring, DO    Physical Exam: BP 123/78 (BP Location: Right Arm)   Pulse (!) 120   Temp 99 F (37.2 C) (Oral)   Resp 18   Ht 5' 8 (1.727 m)   Wt 96.6 kg   SpO2 100%   BMI 32.39 kg/m  General: Pleasant, chronically ill elderly man sitting on side of bed  continuously scratching himself HEENT: [place]/AT. Anicteric sclera CV: Tachycardic. Regular rhythm.  No murmurs, rubs, or gallops. No LE edema Pulmonary: Lungs CTAB. Normal effort. No wheezing or rales. Abdominal: Soft, mild distention. Mild epigastric tenderness. No guarding or rebound tenderness. Normal bowel sounds. Extremities: Mild chronic lower extremity edema. Palpable radial and DP pulses. S/p L 5th partial fifth metatarsal head resection with healing ulcer. Skin: Warm and dry. A few excoriations on skin,  some hives on the back. Neuro: A&Ox3. Moves all extremities. Normal sensation to light touch. No focal deficit. Psych: Normal mood and affect          Labs on Admission:  Basic Metabolic Panel: Recent Labs  Lab 04/22/24 1344  NA 140  K 4.3  CL 101  CO2 27  GLUCOSE 139*  BUN 12  CREATININE 1.36*  CALCIUM  10.1   Liver Function Tests: Recent Labs  Lab 04/22/24 1344  AST 25  ALT 16  ALKPHOS 64  BILITOT 0.7  PROT 6.2*  ALBUMIN 4.0   Recent Labs  Lab 04/22/24 1344  LIPASE <10*   No results for input(s): AMMONIA in the last 168 hours. CBC: Recent Labs  Lab 04/22/24 1344  WBC 7.8  HGB 9.6*  HCT 28.6*  MCV 93.8  PLT 281   Cardiac Enzymes: No results for input(s): CKTOTAL, CKMB, CKMBINDEX, TROPONINI in the last 168 hours. BNP (last 3 results) No results for input(s): BNP in the last 8760 hours.  ProBNP (last 3 results) No results for input(s): PROBNP in the last 8760 hours.  CBG: Recent Labs  Lab 04/22/24 2004  GLUCAP 155*    Radiological Exams on Admission: CT ABDOMEN PELVIS W CONTRAST Result Date: 04/22/2024 CLINICAL DATA:  Acute abdominal pain. EXAM: CT ABDOMEN AND PELVIS WITH CONTRAST TECHNIQUE: Multidetector CT imaging of the abdomen and pelvis was performed using the standard protocol following bolus administration of intravenous contrast. RADIATION DOSE REDUCTION: This exam was performed according to the departmental  dose-optimization program which includes automated exposure control, adjustment of the mA and/or kV according to patient size and/or use of iterative reconstruction technique. CONTRAST:  OMNIPAQUE  IOHEXOL  300 MG/ML  SOLN COMPARISON:  07/21/2021 FINDINGS: Lower chest: Small hiatal hernia. No acute airspace disease or pleural effusion. Hepatobiliary: Liver is prominent size spanning 17.2 cm cranial caudal with hepatic steatosis. No focal liver lesion. Minimally distended gallbladder. No calcified gallstone or pericholecystic inflammation. No biliary dilatation. Pancreas: Mild parenchymal atrophy. No ductal dilatation or inflammation. Spleen: Normal in size without focal abnormality. Adrenals/Urinary Tract: No adrenal nodules. Slight prominence of both renal collecting systems without frank hydronephrosis. Homogeneous renal enhancement with symmetric excretion on delayed phase imaging. Lobulated renal contours. No renal stone or focal lesion. The urinary bladder is moderately distended. No bladder wall thickening. Stomach/Bowel: Small hiatal hernia. No small bowel obstruction or inflammation. There is swirling of the small bowel mesentery with small-bowel loops projecting lateral to the ascending colon in the right abdomen. Slight mesenteric engorgement. No bowel pneumatosis. The appendix is not well demonstrated on the current exam. Moderate colonic stool burden. The sigmoid colon is redundant and courses into the right abdomen. No colonic wall thickening or inflammation. Vascular/Lymphatic: Left IVC. Aortic atherosclerosis. No aortic aneurysm. The portal vein is patent. No suspicious lymphadenopathy. Reproductive: Prostate is unremarkable. Other: No free air, free fluid, or intra-abdominal fluid collection. Small fat containing umbilical hernia. Minimal fat in the inguinal canals. Musculoskeletal: Degenerative change throughout the spine partial fusion of the sacroiliac joints. There are no acute or suspicious  osseous abnormalities. IMPRESSION: 1. Swirling of the small bowel mesentery with small-bowel loops projecting lateral to the ascending colon in the right abdomen. Slight mesenteric engorgement. Findings are suspicious for internal hernia. No bowel obstruction or inflammation. 2. Moderate colonic stool burden with colonic redundancy, can be seen with constipation. 3. Hepatic steatosis. 4. Small hiatal hernia. Aortic Atherosclerosis (ICD10-I70.0). Electronically Signed   By: Andrea Gasman M.D.   On: 04/22/2024 17:21  Assessment/Plan Duncan Harrison is a 73 y.o. 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  # Epigastric pain # Internal hernia - Patient presenting with more than 1 month of intermittent epigastric pain with associated nausea and vomiting - CT A/P showing some swirling of the small bowel mesentery and slight mesenteric engorgement suspicious for internal hernia but no bowel obstruction or inflammation - General Surgery following, no surgical intervention indicated at the moment - Patient allergic to multiple allergies, will start IV fentanyl  as needed for pain - Follow-up morning abdominal x-ray  # Nausea and vomiting # Constipation - Patient presented with intractable nausea and vomiting with associated constipation after requiring laxatives suppository for bowel movement - CT A/P does show moderate colonic stool burden - Will attempt aggressive bowel regimen with Dulcolax suppository and Senokot-S with his home lactulose , MiraLAX  and Colace - If no improvement in his nausea and vomiting  after improvement in constipation, would likely need GI consult for further evaluation with EGD - Patient with history of uncontrolled diabetes, he is at risk of gastroparesis  # Elevated troponin - Troponin elevated to 124-133, patient without any chest pain, EKG with no ischemic changes - Likely demand ischemia in the setting of persistent abdominal pain, internal hernia and dehydration - Cardiology consulted by EDP, they do not think this is likely ACS but likely stress response from his internal hernia - Trend troponin until peak - Telemetry  # Pruritus - Reports a history of pruritus from multiple medications such as doxycycline  and medications that starts with an H including hydrocodone and hydroxyzine  - Received multiple doses of IV morphine  in the ED and has had intractable itching since then - Morphine  added to allergy list - Start IV Benadryl  as needed for itching  # T2DM with hyperglycemia - Last A1c 8.8% 4 months ago, blood glucose 139->155 on admission - Will hold insulin  regiment until improvement in p.o. intake - SSI with meals, CBG monitoring - Follow-up repeat A1c  # HFrecEF - Recent TTE in 08/2022 showed recovered EF to 60-35%, mild LVH and no valvular abnormality - Patient dry on exam in the setting of nausea and vomiting and inability to tolerate p.o. intake - Hold torsemide   # CKD 3A - Creatinine of 1.36 around baseline of 1.3-1.4 - Trend renal function, avoid nephrotoxic agents  # Autoimmune myositis # Necrotizing myopathy - Follows with neurology at [facility], question if this is contributing to his persistent GI symptoms - Continue baclofen  and prednisone   # Hypothyroidism - Continue Synthroid   # Anxiety and depression - Continue duloxetine  and Seroquel   # BPH - Continue oxybutynin  and tamsulosin   # Generalized weakness # Dehydration - Secondary to persistent nausea and vomiting - Continue on IV NS at 100 cc/h for 1 day - PT/OT eval and  treat  DVT prophylaxis: SCDs    Code Status: Full Code  Consults called: General Surgery, cardiology  Family Communication: Discussed results/findings and plan for admission with spouse at bedside  Severity of Illness: The appropriate patient status for this patient is INPATIENT. Inpatient status is judged to be reasonable and necessary in order to provide the required intensity of service to ensure the  patient's safety. The patient's presenting symptoms, physical exam findings, and initial radiographic and laboratory data in the context of their chronic comorbidities is felt to place them at high risk for further clinical deterioration. Furthermore, it is not anticipated that the patient will be medically stable for discharge from the hospital within 2 midnights of admission.   * I certify that at the point of admission it is my clinical judgment that the patient will require inpatient hospital care spanning beyond 2 midnights from the point of admission due to high intensity of service, high risk for further deterioration and high frequency of surveillance required.*  Level of care: Telemetry   I personally spent a total of 80 minutes in the care of the patient today including preparing to see the patient, getting/reviewing separately obtained history, performing a medically appropriate exam/evaluation, placing orders, documenting clinical information in the EHR, and communicating results.   Lou Claretta HERO, MD 04/23/2024, 12:00 AM [facility] Pager: [phone] Isaiah 41:10   If 7PM-7AM, please contact night-coverage [email or web address] Password [email or web address]     [1]  Allergies Allergen Reactions   Doxycycline  Itching    Itching, weakness, stomach pains, nausea, eye pain, sore throat and rash   Hydroxyzine  Other (See Comments)    Itching and rash   Empagliflozin     Other Reaction(s): Candidiasis   Morphine  Itching   Semaglutide Diarrhea and Rash    Diarrhea   Benazepril  Hives and Cough   Cephalexin  Hives    welts   Gabapentin  Swelling   Hydrocodone Hives and Itching   Shellfish Allergy Diarrhea and Nausea And Vomiting   "

## 2024-04-22 NOTE — ED Provider Notes (Signed)
 Patient seen after arriving to the emergency department.  He has spoken to the surgeon already who according the patient states patient does not need surgery and the surgeon did not feel patient needed to go to surgery right now.  However he did want him admitted to run additional test to see where his symptoms were coming from. Wife is scratching him because he has had significant itching after receiving the morphine  dose. Discussed with hospitalist.  They will evaluate patient for admission.   Hildegard Loge, PA-C 04/22/24 2145    Rogelia Jerilynn RAMAN, MD 04/22/24 2147

## 2024-04-22 NOTE — Consult Note (Signed)
 Reason for Consult: Abdominal pain Referring Physician: Dr. Rogelia Divine [name] is an 73 y.o. male.  HPI: The patient is a 73 year old black male who presents with several complaints.  First he has been having intermittent abdominal pain for the last couple months.  He has been passing flatus and having bowel movements although his wife says he is constipated.  He has been nauseated with vomiting sometimes just at the mention of food.  He went to the emergency department tonight where a CT scan did show what look like some twisting or swirling of the small bowel mesentery but no evidence of bowel obstruction and no evidence of ischemic bowel.  Secondly the patient has been having significant itching also over the last couple months.  The wife feels like this is a reaction to different medicines that he has been put on.  He has been on prednisone  and methotrexate  for a myopathy.  The family states that the prednisone  has caused him to have lymphedema of his lower extremities.  He also has a history of congestive heart failure.  He was scheduled for upper and lower endoscopy in January at [facility]  Past Medical History:  Diagnosis Date   Atopic dermatitis 12/09/2022   Autoimmune disease    Bronchitis    Chronic HFrEF (heart failure with reduced ejection fraction) (HCC)    normalized EF   Chronic hiccups    Chronic kidney disease, stage 3a (HCC)    DM2 (diabetes mellitus, type 2) (HCC) 09/28/2014   DVT (deep venous thrombosis) (HCC)    GERD (gastroesophageal reflux disease)    Hypogonadism in male 04/01/2018   Hypothyroidism    Joint pain    Necrotizing myopathy    Nonobstructive atherosclerosis of coronary artery    Posttraumatic stress disorder 01/30/2022   PTSD (post-traumatic stress disorder)    Pulmonary embolism (HCC)    Rheumatoid arthritis (HCC) 08/09/2021   RSV (respiratory syncytial virus pneumonia) 03/11/2022    Past Surgical History:  Procedure Laterality Date   BIOPSY SHOULDER      Left   IR ANGIOGRAM PULMONARY BILATERAL SELECTIVE  07/22/2021   IR THROMBECT VENO MECH MOD SED  07/22/2021   IR US  GUIDE VASC ACCESS LEFT  07/22/2021   RADIOLOGY WITH ANESTHESIA Right 07/22/2021   Procedure: IR WITH ANESTHESIA;  Surgeon: Radiologist, Medication, MD;  Location: [facility] OR;  Service: Radiology;  Laterality: Right;   UPPER LEG SOFT TISSUE BIOPSY Right     Family History  Problem Relation Age of Onset   Hypertension Mother    Diabetes Mother    Arthritis Mother    Depression Mother    Heart attack Father    Hypertension Father     Social History:  reports that he has never smoked. He has never used smokeless tobacco. He reports that he does not drink alcohol  and does not use drugs.  Allergies: Allergies[1]  Medications: I have reviewed the patient's current medications.  Results for orders placed or performed during the hospital encounter of 04/22/24 (from the past 48 hours)  Lipase, blood     Status: Abnormal   Collection Time: 04/22/24  1:44 PM  Result Value Ref Range   Lipase <10 (L) 11 - 51 U/L    Comment: Performed at [facility], [address] [place], [place] [address]  Comprehensive metabolic panel     Status: Abnormal   Collection Time: 04/22/24  1:44 PM  Result Value Ref Range   Sodium 140 135 -  145 mmol/L   Potassium 4.3 3.5 - 5.1 mmol/L   Chloride 101 98 - 111 mmol/L   CO2 27 22 - 32 mmol/L   Glucose, Bld 139 (H) 70 - 99 mg/dL    Comment: Glucose reference range applies only to samples taken after fasting for at least 8 hours.   BUN 12 8 - 23 mg/dL   Creatinine, Ser 8.63 (H) 0.61 - 1.24 mg/dL   Calcium  10.1 8.9 - 10.3 mg/dL   Total Protein 6.2 (L) 6.5 - 8.1 g/dL   Albumin 4.0 3.5 - 5.0 g/dL   AST 25 15 - 41 U/L    Comment: HEMOLYSIS AT THIS LEVEL MAY AFFECT RESULT   ALT 16 0 - 44 U/L   Alkaline Phosphatase 64 38 - 126 U/L   Total Bilirubin 0.7 0.0 - 1.2 mg/dL   GFR, Estimated 54 (L) >60 mL/min    Comment: (NOTE) Calculated using  the CKD-EPI Creatinine Equation (2021)    Anion gap 12 5 - 15    Comment: Performed at [facility], [address] [place], [place] [address]  CBC     Status: Abnormal   Collection Time: 04/22/24  1:44 PM  Result Value Ref Range   WBC 7.8 4.0 - 10.5 K/uL   RBC 3.05 (L) 4.22 - 5.81 MIL/uL   Hemoglobin 9.6 (L) 13.0 - 17.0 g/dL   HCT 71.3 (L) 60.9 - 47.9 %   MCV 93.8 80.0 - 100.0 fL   MCH 31.5 26.0 - 34.0 pg   MCHC 33.6 30.0 - 36.0 g/dL   RDW 85.5 88.4 - 84.4 %   Platelets 281 150 - 400 K/uL   nRBC 1.4 (H) 0.0 - 0.2 %    Comment: Performed at [facility], [address] [place], [place] [address]  Urinalysis, Routine w reflex microscopic -Urine, Clean Catch     Status: None   Collection Time: 04/22/24  5:33 PM  Result Value Ref Range   Color, Urine YELLOW YELLOW   APPearance CLEAR CLEAR   Specific Gravity, Urine 1.015 1.005 - 1.030   pH 8.0 5.0 - 8.0   Glucose, UA NEGATIVE NEGATIVE mg/dL   Hgb urine dipstick NEGATIVE NEGATIVE   Bilirubin Urine NEGATIVE NEGATIVE   Ketones, ur NEGATIVE NEGATIVE mg/dL   Protein, ur NEGATIVE NEGATIVE mg/dL   Nitrite NEGATIVE NEGATIVE   Leukocytes,Ua NEGATIVE NEGATIVE    Comment: Microscopic not done on urines with negative protein, blood, leukocytes, nitrite, or glucose < 500 mg/dL. Performed at [facility], [address] [place], [place] [address]   Troponin T, High Sensitivity     Status: Abnormal   Collection Time: 04/22/24  6:13 PM  Result Value Ref Range   Troponin T High Sensitivity 124 (HH) 0 - 19 ng/L    Comment: CRITICAL RESULT CALLED TO, READ BACK BY AND VERIFIED WITH: A,DILLARD AT 1920 ON 04/22/24 BY A,MOHAMED (NOTE) Biotin concentrations > 1000 ng/mL falsely decrease TnT results.  Serial cardiac troponin measurements are suggested.  Refer to the Links section for chest pain algorithms and additional  guidance. Performed at [facility], [address] [place], [place] [address]    Troponin T, High Sensitivity     Status: Abnormal   Collection Time: 04/22/24  7:50 PM  Result Value Ref Range   Troponin T High Sensitivity 133 (HH) 0 - 19 ng/L    Comment: Critical Value, Read Back and verified with  A,DILLARD AT 2031 ON 04/22/24 BY A,MOHAMED (NOTE) Biotin concentrations > 1000 ng/mL falsely decrease TnT results.  Serial cardiac troponin measurements are suggested.  Refer to the Links section for chest pain algorithms and additional  guidance. Performed at [facility], [address] [place], [place] [address]   CBG monitoring, ED     Status: Abnormal   Collection Time: 04/22/24  8:04 PM  Result Value Ref Range   Glucose-Capillary 155 (H) 70 - 99 mg/dL    Comment: Glucose reference range applies only to samples taken after fasting for at least 8 hours.    CT ABDOMEN PELVIS W CONTRAST Result Date: 04/22/2024 CLINICAL DATA:  Acute abdominal pain. EXAM: CT ABDOMEN AND PELVIS WITH CONTRAST TECHNIQUE: Multidetector CT imaging of the abdomen and pelvis was performed using the standard protocol following bolus administration of intravenous contrast. RADIATION DOSE REDUCTION: This exam was performed according to the departmental dose-optimization program which includes automated exposure control, adjustment of the mA and/or kV according to patient size and/or use of iterative reconstruction technique. CONTRAST:  OMNIPAQUE  IOHEXOL  300 MG/ML  SOLN COMPARISON:  07/21/2021 FINDINGS: Lower chest: Small hiatal hernia. No acute airspace disease or pleural effusion. Hepatobiliary: Liver is prominent size spanning 17.2 cm cranial caudal with hepatic steatosis. No focal liver lesion. Minimally distended gallbladder. No calcified gallstone or pericholecystic inflammation. No biliary dilatation. Pancreas: Mild parenchymal atrophy. No ductal dilatation or inflammation. Spleen: Normal in size without focal abnormality. Adrenals/Urinary Tract: No adrenal nodules. Slight  prominence of both renal collecting systems without frank hydronephrosis. Homogeneous renal enhancement with symmetric excretion on delayed phase imaging. Lobulated renal contours. No renal stone or focal lesion. The urinary bladder is moderately distended. No bladder wall thickening. Stomach/Bowel: Small hiatal hernia. No small bowel obstruction or inflammation. There is swirling of the small bowel mesentery with small-bowel loops projecting lateral to the ascending colon in the right abdomen. Slight mesenteric engorgement. No bowel pneumatosis. The appendix is not well demonstrated on the current exam. Moderate colonic stool burden. The sigmoid colon is redundant and courses into the right abdomen. No colonic wall thickening or inflammation. Vascular/Lymphatic: Left IVC. Aortic atherosclerosis. No aortic aneurysm. The portal vein is patent. No suspicious lymphadenopathy. Reproductive: Prostate is unremarkable. Other: No free air, free fluid, or intra-abdominal fluid collection. Small fat containing umbilical hernia. Minimal fat in the inguinal canals. Musculoskeletal: Degenerative change throughout the spine partial fusion of the sacroiliac joints. There are no acute or suspicious osseous abnormalities. IMPRESSION: 1. Swirling of the small bowel mesentery with small-bowel loops projecting lateral to the ascending colon in the right abdomen. Slight mesenteric engorgement. Findings are suspicious for internal hernia. No bowel obstruction or inflammation. 2. Moderate colonic stool burden with colonic redundancy, can be seen with constipation. 3. Hepatic steatosis. 4. Small hiatal hernia. Aortic Atherosclerosis (ICD10-I70.0). Electronically Signed   By: Andrea Gasman M.D.   On: 04/22/2024 17:21    Review of Systems  Constitutional: Negative.   HENT: Negative.    Eyes: Negative.   Respiratory: Negative.    Cardiovascular:  Positive for leg swelling.  Gastrointestinal:  Positive for abdominal pain, nausea  and vomiting.  Endocrine: Negative.   Genitourinary: Negative.   Musculoskeletal: Negative.   Skin: Negative.   Allergic/Immunologic: Positive for immunocompromised state.  Hematological: Negative.   Psychiatric/Behavioral:  Positive for agitation.    Blood pressure 134/79, pulse (!) 110, temperature 98.5 F (36.9 C), temperature source Oral, resp. rate 18, height 5' 8 (1.727 m), weight 96.6  kg, SpO2 100%. Physical Exam Vitals reviewed.  Constitutional:      General: He is not in acute distress.    Appearance: Normal appearance.  HENT:     Head: Normocephalic and atraumatic.     Right Ear: External ear normal.     Left Ear: External ear normal.     Nose: Nose normal.     Mouth/Throat:     Mouth: Mucous membranes are moist.     Pharynx: Oropharynx is clear.  Eyes:     General: No scleral icterus.    Extraocular Movements: Extraocular movements intact.     Conjunctiva/sclera: Conjunctivae normal.     Pupils: Pupils are equal, round, and reactive to light.  Cardiovascular:     Rate and Rhythm: Normal rate and regular rhythm.     Pulses: Normal pulses.     Heart sounds: Normal heart sounds.  Pulmonary:     Effort: Pulmonary effort is normal. No respiratory distress.     Breath sounds: Normal breath sounds.  Abdominal:     Palpations: Abdomen is soft.     Comments: There is mild distention.  The abdomen is soft.  There is occasional tenderness but no obvious guarding or peritonitis  Musculoskeletal:        General: Swelling present. No deformity. Normal range of motion.     Cervical back: Normal range of motion and neck supple.  Skin:    General: Skin is warm and dry.  Neurological:     General: No focal deficit present.     Mental Status: He is alert and oriented to person, place, and time.  Psychiatric:        Mood and Affect: Mood normal.        Behavior: Behavior normal.     Assessment/Plan: The patient has some swirling of the small bowel mesentery but no  evidence of obstruction or bowel ischemia.  Because of this I do not think he needs to urgently go to the operating room.  Given his strange nausea and vomiting I do think he would benefit from a workup by gastroenterology with possible upper endoscopy.  He is on chronic steroids and methotrexate  for a myopathy.  He also has significant itching the source of which is unclear.  At this point I would favor medical admission to workup his medical problems.  He needs a gastroenterology consult.  If his abdominal exam worsens we can always consider operating on him emergently but at this point I would hold off and continue the workup.  His last dose of Eliquis  was last night.  We will follow him closely with you.  Deward Null III 04/22/2024, 9:23 PM         [1]  Allergies Allergen Reactions   Doxycycline  Itching    Itching, weakness, stomach pains, nausea, eye pain, sore throat and rash   Hydroxyzine  Other (See Comments)    Itching and rash   Empagliflozin     Other Reaction(s): Candidiasis   Semaglutide Diarrhea and Rash    Diarrhea   Benazepril Hives and Cough   Cephalexin  Hives    welts   Gabapentin  Swelling   Hydrocodone Hives and Itching   Shellfish Allergy Diarrhea and Nausea And Vomiting

## 2024-04-22 NOTE — ED Triage Notes (Signed)
 Pt c/o epigastric pain for a while with n/v, poor appetite, constipation, ongoing for a month or two. Pain rated 9/10.

## 2024-04-22 NOTE — ED Notes (Signed)
 Attempted PIV insertion, unsuccessful. Obtained blood specimen for labs.

## 2024-04-22 NOTE — H&P (Incomplete)
 " History and Physical  Sherley Scalese FMW:[id number] DOB: Mar 28, 1951 DOA: 04/22/2024  PCP: Alvia Bring, DO   Chief Complaint: Epigastric pain  HPI: Sebastian Ferrufino is a 73 y.o. 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 Per spouse, patient has had abdominal pain on and off for over 1 month. Patient describes the pain as crampy and and occasionally sharp. Patient has also had persistent constipation, often requiring suppository and laxatives to move his bowels over the last few weeks. Patient has had associated intractable nausea and vomiting with even the smell of food often making him nauseous. He has not been able to keep much food down but was able to eat 2 cookies today.  He denies any headaches, fevers, chills, chest pain or shortness of breath.  Patient also endorsed history of severe pruritus from medications he is allergic and after receiving morphine  in the ED, he is continuing to have uncontrollable itching. Spouse reports that patient is scheduled to have an abdominal ultrasound, endoscopy and colonoscopy in January for further evaluation of his abdominal symptoms.  ED Course: Initial vitals show afebrile, 16-29, HR 100-130s, SBP 120-140s, SpO2 100% on room air. Initial labs significant for creatinine 1.36, WBC 7.8, Hgb 9.6, troponin 124-133, UA with no signs of infection. EKG shows sinus tach. CT A/P shows findings suspicious for internal hernia, moderate colonic stool burden and hepatic steatosis. Pt received IV Zofran , multiple doses of IV morphine  and IV Benadryl . General surgery and cardiology were consulted for evaluation. Patient was  transferred to [facility], ED and [facility] was consulted for admission.  Review of Systems: Please see HPI for pertinent positives and negatives. A complete 10 system review of systems are otherwise negative.  Past Medical History:  Diagnosis Date   Atopic dermatitis 12/09/2022   Autoimmune disease    Bronchitis    Chronic HFrEF (heart failure with reduced ejection fraction) (HCC)    normalized EF   Chronic hiccups    Chronic kidney disease, stage 3a (HCC)    DM2 (diabetes mellitus, type 2) (HCC) 09/28/2014   DVT (deep venous thrombosis) (HCC)    GERD (gastroesophageal reflux disease)    Hypogonadism in male 04/01/2018   Hypothyroidism    Joint pain    Necrotizing myopathy    Nonobstructive atherosclerosis of coronary artery    Posttraumatic stress disorder 01/30/2022   PTSD (post-traumatic stress disorder)    Pulmonary embolism (HCC)    Rheumatoid arthritis (HCC) 08/09/2021   RSV (respiratory syncytial virus pneumonia) 03/11/2022   Past Surgical History:  Procedure Laterality Date   BIOPSY SHOULDER     Left   IR ANGIOGRAM PULMONARY BILATERAL SELECTIVE  07/22/2021   IR THROMBECT VENO MECH MOD SED  07/22/2021   IR US  GUIDE VASC ACCESS LEFT  07/22/2021   RADIOLOGY WITH ANESTHESIA Right 07/22/2021   Procedure: IR WITH ANESTHESIA;  Surgeon: Radiologist, Medication, MD;  Location: [facility] OR;  Service: Radiology;  Laterality: Right;   UPPER LEG SOFT TISSUE BIOPSY Right    Social History:  reports that he has never smoked. He has never used smokeless tobacco. He reports that he does not drink alcohol  and does not use drugs.  Allergies[1]  Family History  Problem Relation Age of  Onset   Hypertension Mother    Diabetes Mother    Arthritis Mother    Depression Mother    Heart attack Father    Hypertension Father      Prior to Admission medications  Medication Sig Start Date End Date Taking? Authorizing Provider  acetaminophen  (TYLENOL ) 500 MG tablet Take  500-1,000 mg by mouth See admin instructions. Take 1000 mg by mouth in the morning and 500 mg at bedtime    [provider]  AMBULATORY NON FORMULARY MEDICATION Please provide 3 in 1 bedside commode.  Dx:R29.6, M62.81 04/16/22   Alvia Bring, DO  AMBULATORY NON FORMULARY MEDICATION Please provide bilateral thigh high compression stockings: Graded compression of 20-64mmhg 09/05/22   Alvia Bring, DO  amitriptyline  (ELAVIL ) 25 MG tablet Take 1 tab po qhs Patient not taking: Reported on 04/17/2024 03/13/23   Alvia Bring, DO  apixaban  (ELIQUIS ) 5 MG TABS tablet Take 1 tablet (5 mg total) by mouth 2 (two) times daily. 08/09/22   Vannie Reche RAMAN, NP  baclofen  (LIORESAL ) 20 MG tablet TAKE 1 TABLET BY MOUTH TWICE A DAY 01/30/22   Alvia Bring, DO  bisacodyl  (DULCOLAX) 10 MG suppository Place 1 suppository (10 mg total) rectally as needed for moderate constipation. 08/20/23   Francesca Elsie CROME, MD  calcium  carbonate (OS-CAL) 600 MG TABS tablet Take 600 mg by mouth daily.    [provider]  Calcium  Carbonate Antacid (TUMS PO) Take 3 tablets by mouth 2 (two) times daily as needed (heartburn).    [provider]  cetirizine  (ZYRTEC ) 10 MG tablet Take 1 tablet (10 mg total) by mouth daily. 01/02/24   Maranda Jamee Jacob, MD  ciclopirox  (LOPROX ) 0.77 % cream Apply topically. 02/12/24   [provider]  docusate (COLACE) 50 MG/5ML liquid Take 50 mg by mouth daily.    [provider]  DULoxetine  (CYMBALTA ) 30 MG capsule Take 30 mg by mouth daily.    [provider]  EPINEPHrine  0.3 mg/0.3 mL IJ SOAJ injection Inject 0.3 mg into the muscle as needed for anaphylaxis. 12/26/23   Tegeler, Lonni PARAS, MD  Evolocumab  (REPATHA  SURECLICK) 140 MG/ML SOAJ Inject 140 mg into the skin every 14 (fourteen) days. 06/25/23   Walker, Caitlin S, NP  Evolocumab  (REPATHA  SURECLICK) 140 MG/ML SOAJ Inject 140 mg into the skin every 14 (fourteen) days. 10/09/23   Daneen Damien BROCKS,  NP  folic acid  (FOLVITE ) 1 MG tablet Take 2 mg by mouth daily.    [provider]  hydrOXYzine  (ATARAX ) 25 MG tablet Take 1 tablet (25 mg total) by mouth every 6 (six) hours as needed. Patient not taking: Reported on 04/17/2024 01/02/24   Maranda Jamee Jacob, MD  insulin  aspart (NOVOLOG  FLEXPEN) 100 UNIT/ML FlexPen Inject 15 Units into the skin 3 (three) times daily with meals.    [provider]  insulin  glargine (LANTUS ) 100 UNIT/ML injection Inject 0.38 mLs (38 Units total) into the skin daily. 09/15/22   Cheryle Page, MD  lactulose  (CHRONULAC ) 10 GM/15ML solution Take 15 mLs (10 g total) by mouth 3 (three) times daily. 08/20/23   Francesca Elsie CROME, MD  latanoprost  (XALATAN ) 0.005 % ophthalmic solution 1 drop at bedtime. 08/17/22   [provider]  Levothyroxine  Sodium 75 MCG CAPS Take 75 mcg by mouth daily.    [provider]  metFORMIN (GLUCOPHAGE-XR) 500 MG 24 hr tablet Take 500 mg by mouth 2 (two) times daily with a meal. 02/12/24   [provider]  methotrexate  (  RHEUMATREX) 5 MG tablet Take 3 tablets by mouth once a week. Caution: Chemotherapy. Protect from light. Patient taking differently: Take 2.5 mg by mouth once a week. Caution: Chemotherapy. Protect from light.  Takes 6 tablets (15 mg total) by mouth every 7 days    [provider]  mirabegron  ER (MYRBETRIQ ) 50 MG TB24 tablet Take 1 tablet (50 mg total) by mouth daily. Patient not taking: Reported on 04/17/2024 03/13/23   Alvia Bring, DO  Multiple Vitamins-Minerals (PRESERVISION AREDS 2) CAPS Take 2 caps po daily Patient not taking: Reported on 04/17/2024 12/05/22   Alvia Bring, DO  NON FORMULARY Apply 500 mg topically daily.    [provider]  omeprazole (PRILOSEC) 20 MG capsule Take 20 mg by mouth daily.    [provider]  oxybutynin  (DITROPAN ) 5 MG tablet Take 5 mg by mouth 3 (three) times daily.    [provider]  polyethylene glycol powder  (MIRALAX ) 17 GM/SCOOP powder Take 17 g by mouth in the morning, at noon, and at bedtime. Take TID until having regular bowel movement, then QD 08/20/23   Francesca Elsie CROME, MD  polyvinyl alcohol  (LIQUIFILM TEARS) 1.4 % ophthalmic solution Place 1 drop into both eyes in the morning and at bedtime.    [provider]  PREDNISONE  PO Take 8 mg by mouth daily.    [provider]  QUEtiapine  (SEROQUEL ) 50 MG tablet Take by mouth as directed. 09/21/22   [provider]  Semaglutide,0.25 or 0.5MG /DOS, 2 MG/3ML SOPN Inject 0.25 mLs into the skin once a week. Patient not taking: Reported on 04/17/2024 01/17/23   [provider]  sertraline  (ZOLOFT ) 25 MG tablet Take 2 tablets (50 mg total) by mouth daily. Take 50mg  daily. Patient taking differently: Take 50 mg by mouth daily. Take 50mg  daily. Taking as needed 09/15/22   Cheryle Page, MD  SUMAtriptan (IMITREX) 100 MG tablet TAKE ONE TABLET BY MOUTH AS DIRECTED FOR MIGRAINE HEADACHE (FIRST DOSE AT ONSET OF HEADACHE,THEN REPEAT AFTER 2 HOURS IF NO RELIEF-NO MORE THAN 200 MG PER DAY) 03/20/24   [provider]  tamsulosin  (FLOMAX ) 0.4 MG CAPS capsule Take by mouth. 09/21/22   [provider]  torsemide  (DEMADEX ) 20 MG tablet Take 1 tablet (20 mg total) by mouth daily. May take additional tablet as directed by lymphedema clinic or cardiologist. 11/11/23   Daneen Damien BROCKS, NP  triamcinolone  (KENALOG ) 0.1 % paste Apply topically. Patient not taking: Reported on 04/17/2024 09/21/22   [provider]  triamcinolone  cream (KENALOG ) 0.1 % Apply 1 Application topically 2 (two) times daily. Apply bid as needed for rash Patient not taking: Reported on 04/17/2024 12/05/22   Alvia Bring, DO    Physical Exam: BP 123/78 (BP Location: Right Arm)   Pulse (!) 120   Temp 99 F (37.2 C) (Oral)   Resp 18   Ht 5' 8 (1.727 m)   Wt 96.6 kg   SpO2 100%   BMI 32.39 kg/m  General: Pleasant, chronically ill elderly man  sitting on side of bed continuously scratching himself HEENT: [place]/AT. Anicteric sclera CV: Tachycardic. Regular rhythm.  No murmurs, rubs, or gallops. No LE edema Pulmonary: Lungs CTAB. Normal effort. No wheezing or rales. Abdominal: Soft, mild distention. Mild epigastric tenderness. No guarding or rebound tenderness. Normal bowel sounds. Extremities: Mild chronic lower extremity edema. Palpable radial and DP pulses. Normal ROM. Skin: Warm and dry. A few excoriations on skin, some hives on the back. Neuro: A&Ox3. Moves all  extremities. Normal sensation to light touch. No focal deficit. Psych: Normal mood and affect          Labs on Admission:  Basic Metabolic Panel: Recent Labs  Lab 04/22/24 1344  NA 140  K 4.3  CL 101  CO2 27  GLUCOSE 139*  BUN 12  CREATININE 1.36*  CALCIUM  10.1   Liver Function Tests: Recent Labs  Lab 04/22/24 1344  AST 25  ALT 16  ALKPHOS 64  BILITOT 0.7  PROT 6.2*  ALBUMIN 4.0   Recent Labs  Lab 04/22/24 1344  LIPASE <10*   No results for input(s): AMMONIA in the last 168 hours. CBC: Recent Labs  Lab 04/22/24 1344  WBC 7.8  HGB 9.6*  HCT 28.6*  MCV 93.8  PLT 281   Cardiac Enzymes: No results for input(s): CKTOTAL, CKMB, CKMBINDEX, TROPONINI in the last 168 hours. BNP (last 3 results) No results for input(s): BNP in the last 8760 hours.  ProBNP (last 3 results) No results for input(s): PROBNP in the last 8760 hours.  CBG: Recent Labs  Lab 04/22/24 2004  GLUCAP 155*    Radiological Exams on Admission: CT ABDOMEN PELVIS W CONTRAST Result Date: 04/22/2024 CLINICAL DATA:  Acute abdominal pain. EXAM: CT ABDOMEN AND PELVIS WITH CONTRAST TECHNIQUE: Multidetector CT imaging of the abdomen and pelvis was performed using the standard protocol following bolus administration of intravenous contrast. RADIATION DOSE REDUCTION: This exam was performed according to the departmental dose-optimization program which includes  automated exposure control, adjustment of the mA and/or kV according to patient size and/or use of iterative reconstruction technique. CONTRAST:  OMNIPAQUE  IOHEXOL  300 MG/ML  SOLN COMPARISON:  07/21/2021 FINDINGS: Lower chest: Small hiatal hernia. No acute airspace disease or pleural effusion. Hepatobiliary: Liver is prominent size spanning 17.2 cm cranial caudal with hepatic steatosis. No focal liver lesion. Minimally distended gallbladder. No calcified gallstone or pericholecystic inflammation. No biliary dilatation. Pancreas: Mild parenchymal atrophy. No ductal dilatation or inflammation. Spleen: Normal in size without focal abnormality. Adrenals/Urinary Tract: No adrenal nodules. Slight prominence of both renal collecting systems without frank hydronephrosis. Homogeneous renal enhancement with symmetric excretion on delayed phase imaging. Lobulated renal contours. No renal stone or focal lesion. The urinary bladder is moderately distended. No bladder wall thickening. Stomach/Bowel: Small hiatal hernia. No small bowel obstruction or inflammation. There is swirling of the small bowel mesentery with small-bowel loops projecting lateral to the ascending colon in the right abdomen. Slight mesenteric engorgement. No bowel pneumatosis. The appendix is not well demonstrated on the current exam. Moderate colonic stool burden. The sigmoid colon is redundant and courses into the right abdomen. No colonic wall thickening or inflammation. Vascular/Lymphatic: Left IVC. Aortic atherosclerosis. No aortic aneurysm. The portal vein is patent. No suspicious lymphadenopathy. Reproductive: Prostate is unremarkable. Other: No free air, free fluid, or intra-abdominal fluid collection. Small fat containing umbilical hernia. Minimal fat in the inguinal canals. Musculoskeletal: Degenerative change throughout the spine partial fusion of the sacroiliac joints. There are no acute or suspicious osseous abnormalities. IMPRESSION: 1.  Swirling of the small bowel mesentery with small-bowel loops projecting lateral to the ascending colon in the right abdomen. Slight mesenteric engorgement. Findings are suspicious for internal hernia. No bowel obstruction or inflammation. 2. Moderate colonic stool burden with colonic redundancy, can be seen with constipation. 3. Hepatic steatosis. 4. Small hiatal hernia. Aortic Atherosclerosis (ICD10-I70.0). Electronically Signed   By: Andrea Gasman M.D.   On: 04/22/2024 17:21   Assessment/Plan Abdishakur Dripps is a 73 y.o.  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   # Epigastric pain # Internal hernia  # Nausea and vomiting # Constipation  # T2DM with hyperglycemia  # HFrecEF  #***  # Autoimmune myositis # Necrotizing myopathy  # Hypothyroidism - Continue Synthroid   #     DVT prophylaxis: SCDs    Code Status: Full Code  Consults called: General Surgery, cardiology  Family Communication: Discussed results/findings and plan for admission with spouse at bedside  Severity of Illness: The appropriate patient status for this patient is INPATIENT. Inpatient status is judged to be reasonable and necessary in order to provide the required intensity of service to ensure the patient's safety. The patient's presenting symptoms, physical exam findings, and initial radiographic and laboratory data in the context of their chronic comorbidities is felt to place them at high risk for further clinical deterioration. Furthermore, it is not anticipated that the patient will be medically stable for discharge from the hospital within 2 midnights  of admission.   * I certify that at the point of admission it is my clinical judgment that the patient will require inpatient hospital care spanning beyond 2 midnights from the point of admission due to high intensity of service, high risk for further deterioration and high frequency of surveillance required.*  Level of care: Telemetry    Lou Claretta HERO, MD 04/23/2024, 12:00 AM [facility] Pager: [phone] Isaiah 41:10   If 7PM-7AM, please contact night-coverage [email or web address] Password [email or web address]       [1] Allergies Allergen Reactions   Doxycycline  Itching    Itching, weakness, stomach pains, nausea, eye pain, sore throat and rash   Hydroxyzine  Other (See Comments)    Itching and rash   Empagliflozin     Other Reaction(s): Candidiasis   Morphine  Itching   Semaglutide Diarrhea and Rash    Diarrhea   Benazepril Hives and Cough   Cephalexin  Hives    welts   Gabapentin  Swelling   Hydrocodone Hives and Itching   Shellfish Allergy Diarrhea and Nausea And Vomiting  "

## 2024-04-23 ENCOUNTER — Inpatient Hospital Stay (HOSPITAL_COMMUNITY)

## 2024-04-23 DIAGNOSIS — I5020 Unspecified systolic (congestive) heart failure: Secondary | ICD-10-CM

## 2024-04-23 DIAGNOSIS — K458 Other specified abdominal hernia without obstruction or gangrene: Principal | ICD-10-CM

## 2024-04-23 DIAGNOSIS — R112 Nausea with vomiting, unspecified: Secondary | ICD-10-CM

## 2024-04-23 DIAGNOSIS — E1165 Type 2 diabetes mellitus with hyperglycemia: Secondary | ICD-10-CM

## 2024-04-23 LAB — COMPREHENSIVE METABOLIC PANEL WITH GFR
ALT: 11 U/L (ref 0–44)
AST: 22 U/L (ref 15–41)
Albumin: 3.7 g/dL (ref 3.5–5.0)
Alkaline Phosphatase: 75 U/L (ref 38–126)
Anion gap: 10 (ref 5–15)
BUN: 8 mg/dL (ref 8–23)
CO2: 28 mmol/L (ref 22–32)
Calcium: 10.2 mg/dL (ref 8.9–10.3)
Chloride: 108 mmol/L (ref 98–111)
Creatinine, Ser: 1.25 mg/dL — ABNORMAL HIGH (ref 0.61–1.24)
GFR, Estimated: >60 mL/min
Glucose, Bld: 111 mg/dL — ABNORMAL HIGH (ref 70–99)
Potassium: 4.6 mmol/L (ref 3.5–5.1)
Sodium: 146 mmol/L — ABNORMAL HIGH (ref 135–145)
Total Bilirubin: 0.6 mg/dL (ref 0.0–1.2)
Total Protein: 5.8 g/dL — ABNORMAL LOW (ref 6.5–8.1)

## 2024-04-23 LAB — CBC
HCT: 29.3 % — ABNORMAL LOW (ref 39.0–52.0)
Hemoglobin: 9.8 g/dL — ABNORMAL LOW (ref 13.0–17.0)
MCH: 31.8 pg (ref 26.0–34.0)
MCHC: 33.4 g/dL (ref 30.0–36.0)
MCV: 95.1 fL (ref 80.0–100.0)
Platelets: 295 K/uL (ref 150–400)
RBC: 3.08 MIL/uL — ABNORMAL LOW (ref 4.22–5.81)
RDW: 14.2 % (ref 11.5–15.5)
WBC: 7.5 K/uL (ref 4.0–10.5)
nRBC: 1.2 % — ABNORMAL HIGH (ref 0.0–0.2)

## 2024-04-23 LAB — GLUCOSE, CAPILLARY
Glucose-Capillary: 107 mg/dL — ABNORMAL HIGH (ref 70–99)
Glucose-Capillary: 118 mg/dL — ABNORMAL HIGH (ref 70–99)
Glucose-Capillary: 120 mg/dL — ABNORMAL HIGH (ref 70–99)
Glucose-Capillary: 128 mg/dL — ABNORMAL HIGH (ref 70–99)
Glucose-Capillary: 222 mg/dL — ABNORMAL HIGH (ref 70–99)
Glucose-Capillary: 81 mg/dL (ref 70–99)

## 2024-04-23 LAB — HEMOGLOBIN A1C
Hgb A1c MFr Bld: 7.5 % — ABNORMAL HIGH (ref 4.8–5.6)
Mean Plasma Glucose: 168.55 mg/dL

## 2024-04-23 LAB — TROPONIN T, HIGH SENSITIVITY: Troponin T High Sensitivity: 144 ng/L (ref 0–19)

## 2024-04-23 MED ORDER — FENTANYL CITRATE (PF) 50 MCG/ML IJ SOSY
25.0000 ug | PREFILLED_SYRINGE | INTRAMUSCULAR | Status: DC | PRN
  Administered 2024-04-24: 25 ug via INTRAVENOUS
  Filled 2024-04-23: qty 1

## 2024-04-23 MED ORDER — FAMOTIDINE IN NACL 20-0.9 MG/50ML-% IV SOLN
20.0000 mg | Freq: Once | INTRAVENOUS | Status: AC
  Administered 2024-04-23: 20 mg via INTRAVENOUS
  Filled 2024-04-23: qty 50

## 2024-04-23 MED ORDER — ZINC OXIDE 40 % EX OINT
TOPICAL_OINTMENT | CUTANEOUS | Status: DC | PRN
  Filled 2024-04-23: qty 57

## 2024-04-23 MED ORDER — TRIAMCINOLONE 0.1 % CREAM:EUCERIN CREAM 1:1
TOPICAL_CREAM | Freq: Two times a day (BID) | CUTANEOUS | Status: DC
  Filled 2024-04-23 (×2): qty 60

## 2024-04-23 MED ORDER — CAMPHOR-MENTHOL 0.5-0.5 % EX LOTN
TOPICAL_LOTION | CUTANEOUS | Status: DC | PRN
  Administered 2024-04-23: 1 via TOPICAL
  Filled 2024-04-23: qty 222

## 2024-04-23 MED ORDER — DIPHENHYDRAMINE HCL 50 MG/ML IJ SOLN
12.5000 mg | Freq: Once | INTRAMUSCULAR | Status: AC
  Administered 2024-04-23: 12.5 mg via INTRAVENOUS
  Filled 2024-04-23: qty 1

## 2024-04-23 NOTE — Plan of Care (Signed)
" °  Problem: Education: Goal: Knowledge of General Education information will improve Description: Including pain rating scale, medication(s)/side effects and non-pharmacologic comfort measures Outcome: Progressing   Problem: Health Behavior/Discharge Planning: Goal: Ability to manage health-related needs will improve Outcome: Progressing   Problem: Clinical Measurements: Goal: Ability to maintain clinical measurements within normal limits will improve Outcome: Progressing   Problem: Clinical Measurements: Goal: Will remain free from infection Outcome: Not Progressing Goal: Diagnostic test results will improve Outcome: Not Progressing Goal: Respiratory complications will improve Outcome: Not Progressing Goal: Cardiovascular complication will be avoided Outcome: Not Progressing   "

## 2024-04-23 NOTE — Progress Notes (Signed)
 "  PROGRESS NOTE  [name] FMW:[id number] DOB: [birth year] DOA: 04/22/2024 PCP: Alvia Bring, DO  HPI/Recap of past 24 hours: [name] is a 73 y.o. 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 Patient also endorsed history of severe pruritus from multiple medications he is allergic to, now recently morphine  which he received in the ED with now uncontrollable itching. Spouse reports that patient is scheduled to have an abdominal ultrasound, endoscopy and colonoscopy in January for further evaluation of his abdominal symptoms. In the ED, patient noted to be tachypneic/tachycardic, otherwise WNL. Initial labs significant for creatinine 1.36, WBC 7.8, Hgb 9.6, troponin 124-133, UA with no signs of infection. CT A/P shows findings suspicious for internal hernia, moderate colonic stool burden and hepatic steatosis.  EDP discussed with cardiology due to elevated troponin, no further workup.  General surgery consulted.  Patient admitted for further management.    Today, met patient with intractable generalized itching, with some rashes noted.  Discussed extensively with patient and wife, was given some information about topical creams his dermatologist had prescribed for his pruritus.  Orders placed in addition to IV Benadryl .  Patient reports abdominal pain/nausea/vomiting is improving, was able to eat a tiny bit of sandwich.  General surgery recommended to switch to a liquid diet and monitor closely.    Assessment/Plan: Principal Problem:   Epigastric pain Active Problems:   Autoimmune myositis of skeletal muscle   Benign prostatic  hyperplasia   CKD stage 3a, GFR 45-59 ml/min (HCC)   Internal hernia   Heart failure with recovered ejection fraction (HFrecEF) (HCC)   Uncontrolled type 2 diabetes mellitus with hyperglycemia, with long-term current use of insulin  (HCC)   Intractable nausea and vomiting   Epigastric pain/intractable nausea,vomiting Constipation ??Internal hernia Appears to be improving CT A/P showing some swirling of the small bowel mesentery and slight mesenteric engorgement suspicious for internal hernia but no bowel obstruction or inflammation Abdominal x-ray with moderate to severe stool burden and gas in the rectum with mild ileus related to constipation General Surgery following, no surgical intervention indicated at the moment Continue bowel regimen, pain management Plan to consult GI for possible EGD pending clinical course  Pruritus Reports a history of pruritus from multiple medications such as doxycycline , hydrocodone and hydroxyzine , now morphine  Follows with dermatology, recommended triamcinolone  cream, ciclopirox  olamine (not available in our pharmacy), Desitin Continue triamcinolone  cream, Desitin, IV Benadryl  prn  Elevated troponin Troponin elevated to 124-133, patient without any chest pain, EKG with no ischemic changes Likely demand ischemia in the setting of persistent abdominal pain, internal hernia and dehydration Cardiology consulted by EDP, they do not think this is likely ACS but likely stress response from his internal hernia Telemetry   T2DM with hyperglycemia A1c 7.5 on 04/23/2024 SSI, Accu-Cheks, hypoglycemic protocol   HFpEF Recent TTE in 08/2022 showed recovered EF to 60-65%, mild LVH and no valvular abnormality Appears dry Hold torsemide    CKD 3A Creatinine baseline around 1.3-1.4 Trend renal function, avoid nephrotoxic agents   Autoimmune myositis Necrotizing myopathy Follows with neurology at [facility] Continue baclofen  and prednisone     Hypothyroidism Continue Synthroid    Anxiety and depression Continue duloxetine  and Seroquel    BPH Continue oxybutynin  and tamsulosin   Generalized weakness Dehydration Secondary to persistent nausea and vomiting Continue IVF PT/OT eval and treat  Obesity Lifestyle modification advised     Estimated body mass index is 32.39 kg/m as calculated from the following:   Height as of this encounter: 5' 8 (1.727 m).   Weight as of this encounter: 96.6 kg.     Code Status: Full  Family Communication: Wife at bedside  Disposition Plan: Status is: Inpatient Remains inpatient appropriate because: Level of care      Consultants: General surgery  Procedures: None  Antimicrobials: None  DVT prophylaxis: SCDs   Objective: Vitals:   04/22/24 2100 04/22/24 2226 04/23/24 0240 04/23/24 1001  BP: 134/79 123/78 (!) 147/89 139/77  Pulse: (!) 110 (!) 120 (!) 103 (!) 103  Resp: 18 18 18 20   Temp:  99 F (37.2 C) 98.3 F (36.8 C) 98 F (36.7 C)  TempSrc:  Oral Oral Oral  SpO2: 100% 100% 99% 98%  Weight:      Height:        Intake/Output Summary (Last 24 hours) at 04/23/2024 1107 Last data filed at 04/23/2024 0636 Gross per 24 hour  Intake --  Output 850 ml  Net -850 ml   Filed Weights   04/22/24 1340  Weight: 96.6 kg    Exam: General: NAD, generalized pruritus Cardiovascular: S1, S2 present Respiratory: CTAB Abdomen: Soft, nontender, nondistended, bowel sounds present Musculoskeletal: No bilateral pedal edema noted Skin: Rashes/Whirls noted Psychiatry: Normal mood     Data Reviewed: CBC: Recent Labs  Lab 04/22/24 1344 04/23/24 0014  WBC 7.8 7.5  HGB 9.6* 9.8*  HCT 28.6* 29.3*  MCV 93.8 95.1  PLT 281 295   Basic Metabolic Panel: Recent Labs  Lab 04/22/24 1344 04/23/24 0701  NA 140 146*  K 4.3 4.6  CL 101 108  CO2 27 28  GLUCOSE 139* 111*  BUN 12 8  CREATININE 1.36* 1.25*  CALCIUM  10.1 10.2   GFR: Estimated Creatinine  Clearance: 57.6 mL/min (A) (by C-G formula based on SCr of 1.25 mg/dL (H)). Liver Function Tests: Recent Labs  Lab 04/22/24 1344 04/23/24 0701  AST 25 22  ALT 16 11  ALKPHOS 64 75  BILITOT 0.7 0.6  PROT 6.2* 5.8*  ALBUMIN 4.0 3.7   Recent Labs  Lab 04/22/24 1344  LIPASE <10*   No results for input(s): AMMONIA in the last 168 hours. Coagulation Profile: No results for input(s): INR, PROTIME in the last 168 hours. Cardiac Enzymes: No results for input(s): CKTOTAL, CKMB, CKMBINDEX, TROPONINI in the last 168 hours. BNP (last 3 results) No results for input(s): PROBNP in the last 8760 hours. HbA1C: Recent Labs    04/23/24 0014  HGBA1C 7.5*   CBG: Recent Labs  Lab 04/22/24 2004 04/23/24 0022 04/23/24 0245 04/23/24 0810  GLUCAP 155* 107* 81 120*   Lipid Profile: No results for input(s): CHOL, HDL, LDLCALC, TRIG, CHOLHDL, LDLDIRECT in the last 72 hours. Thyroid  Function Tests: No results for input(s): TSH, T4TOTAL, FREET4, T3FREE, THYROIDAB in the last 72 hours. Anemia Panel: No results for input(s): VITAMINB12, FOLATE, FERRITIN, TIBC, IRON, RETICCTPCT in the last 72 hours. Urine analysis:    Component Value Date/Time   COLORURINE YELLOW 04/22/2024 1733   APPEARANCEUR CLEAR 04/22/2024 1733   LABSPEC 1.015 04/22/2024 1733   PHURINE 8.0 04/22/2024 1733   GLUCOSEU NEGATIVE 04/22/2024 1733   HGBUR NEGATIVE 04/22/2024 1733   BILIRUBINUR NEGATIVE 04/22/2024 1733   KETONESUR NEGATIVE 04/22/2024 1733   PROTEINUR NEGATIVE 04/22/2024 1733  UROBILINOGEN 0.2 09/28/2014 1555   NITRITE NEGATIVE 04/22/2024 1733   LEUKOCYTESUR NEGATIVE 04/22/2024 1733   Sepsis Labs: @LABRCNTIP (procalcitonin:4,lacticidven:4)  )No results found for this or any previous visit (from the past 240 hours).    Studies: DG Abd 2 Views Result Date: 04/23/2024 CLINICAL DATA:  Small bowel obstruction. EXAM: ABDOMEN - 2 VIEW COMPARISON:  CT abdomen  pelvis 04/22/2024. FINDINGS: Mild gaseous distention of small bowel with moderate to severe stool burden. Gas in the rectum. No unexpected radiopaque calculi. Contrast is seen in a distended bladder. IMPRESSION: Mild gaseous distension of bowel in the central abdomen with moderate to severe stool burden and gas in the rectum. Findings may be due to a mild ileus related to constipation. CT abdomen pelvis performed yesterday. Electronically Signed   By: Newell Eke M.D.   On: 04/23/2024 10:29   CT ABDOMEN PELVIS W CONTRAST Result Date: 04/22/2024 CLINICAL DATA:  Acute abdominal pain. EXAM: CT ABDOMEN AND PELVIS WITH CONTRAST TECHNIQUE: Multidetector CT imaging of the abdomen and pelvis was performed using the standard protocol following bolus administration of intravenous contrast. RADIATION DOSE REDUCTION: This exam was performed according to the departmental dose-optimization program which includes automated exposure control, adjustment of the mA and/or kV according to patient size and/or use of iterative reconstruction technique. CONTRAST:  OMNIPAQUE  IOHEXOL  300 MG/ML  SOLN COMPARISON:  07/21/2021 FINDINGS: Lower chest: Small hiatal hernia. No acute airspace disease or pleural effusion. Hepatobiliary: Liver is prominent size spanning 17.2 cm cranial caudal with hepatic steatosis. No focal liver lesion. Minimally distended gallbladder. No calcified gallstone or pericholecystic inflammation. No biliary dilatation. Pancreas: Mild parenchymal atrophy. No ductal dilatation or inflammation. Spleen: Normal in size without focal abnormality. Adrenals/Urinary Tract: No adrenal nodules. Slight prominence of both renal collecting systems without frank hydronephrosis. Homogeneous renal enhancement with symmetric excretion on delayed phase imaging. Lobulated renal contours. No renal stone or focal lesion. The urinary bladder is moderately distended. No bladder wall thickening. Stomach/Bowel: Small hiatal hernia.  No small bowel obstruction or inflammation. There is swirling of the small bowel mesentery with small-bowel loops projecting lateral to the ascending colon in the right abdomen. Slight mesenteric engorgement. No bowel pneumatosis. The appendix is not well demonstrated on the current exam. Moderate colonic stool burden. The sigmoid colon is redundant and courses into the right abdomen. No colonic wall thickening or inflammation. Vascular/Lymphatic: Left IVC. Aortic atherosclerosis. No aortic aneurysm. The portal vein is patent. No suspicious lymphadenopathy. Reproductive: Prostate is unremarkable. Other: No free air, free fluid, or intra-abdominal fluid collection. Small fat containing umbilical hernia. Minimal fat in the inguinal canals. Musculoskeletal: Degenerative change throughout the spine partial fusion of the sacroiliac joints. There are no acute or suspicious osseous abnormalities. IMPRESSION: 1. Swirling of the small bowel mesentery with small-bowel loops projecting lateral to the ascending colon in the right abdomen. Slight mesenteric engorgement. Findings are suspicious for internal hernia. No bowel obstruction or inflammation. 2. Moderate colonic stool burden with colonic redundancy, can be seen with constipation. 3. Hepatic steatosis. 4. Small hiatal hernia. Aortic Atherosclerosis (ICD10-I70.0). Electronically Signed   By: Andrea Gasman M.D.   On: 04/22/2024 17:21    Scheduled Meds:  baclofen   20 mg Oral BID   bisacodyl   10 mg Rectal Once   calcium  carbonate  1,250 mg Oral Daily   docusate  50 mg Oral Daily   DULoxetine   30 mg Oral Daily   folic acid   2 mg Oral Daily   insulin  aspart  0-15 Units Subcutaneous  TID WC   insulin  aspart  0-5 Units Subcutaneous QHS   lactulose   10 g Oral TID   latanoprost   1 drop Both Eyes QHS   levothyroxine   75 mcg Oral Q0600   loratadine   10 mg Oral Daily   oxybutynin   5 mg Oral TID   predniSONE   8 mg Oral Daily   QUEtiapine   50 mg Oral QHS    senna-docusate  2 tablet Oral QHS   tamsulosin   0.4 mg Oral Daily   triamcinolone  0.1 % cream : eucerin   Topical BID    Continuous Infusions:  sodium chloride  100 mL/hr at 04/23/24 0757     LOS: 1 day     Lebron JINNY Cage, MD [facility]  If 7PM-7AM, please contact night-coverage [email or web address] 04/23/2024, 11:07 AM    "

## 2024-04-23 NOTE — Progress Notes (Signed)
 Patient requested to have foam removed from his sacrum stated, this pad is making me itch. Removed foam. Patient able to turn self in bed, and walk with assistance. Plan of care ongoing.

## 2024-04-23 NOTE — Progress Notes (Signed)
 "    Subjective/Chief Complaint: No complaints. Feels much better this am. No vomiting overnight   Objective: Vital signs in last 24 hours: Temp:  [97.9 F (36.6 C)-99 F (37.2 C)] 98.3 F (36.8 C) (12/25 0240) Pulse Rate:  [100-120] 103 (12/25 0240) Resp:  [16-22] 18 (12/25 0240) BP: (123-162)/(69-98) 147/89 (12/25 0240) SpO2:  [89 %-100 %] 99 % (12/25 0240) Weight:  [96.6 kg] 96.6 kg (12/24 1340) Last BM Date : 04/21/24  Intake/Output from previous day: 12/24 0701 - 12/25 0700 In: -  Out: 850 [Urine:850] Intake/Output this shift: No intake/output data recorded.  General appearance: alert and cooperative Resp: clear to auscultation bilaterally Cardio: regular rate and rhythm GI: soft, minimal tenderness  Lab Results:  Recent Labs    04/22/24 1344 04/23/24 0014  WBC 7.8 7.5  HGB 9.6* 9.8*  HCT 28.6* 29.3*  PLT 281 295   BMET Recent Labs    04/22/24 1344 04/23/24 0701  NA 140 146*  K 4.3 4.6  CL 101 108  CO2 27 28  GLUCOSE 139* 111*  BUN 12 8  CREATININE 1.36* 1.25*  CALCIUM  10.1 10.2   PT/INR No results for input(s): LABPROT, INR in the last 72 hours. ABG No results for input(s): PHART, HCO3 in the last 72 hours.  Invalid input(s): PCO2, PO2  Studies/Results: CT ABDOMEN PELVIS W CONTRAST Result Date: 04/22/2024 CLINICAL DATA:  Acute abdominal pain. EXAM: CT ABDOMEN AND PELVIS WITH CONTRAST TECHNIQUE: Multidetector CT imaging of the abdomen and pelvis was performed using the standard protocol following bolus administration of intravenous contrast. RADIATION DOSE REDUCTION: This exam was performed according to the departmental dose-optimization program which includes automated exposure control, adjustment of the mA and/or kV according to patient size and/or use of iterative reconstruction technique. CONTRAST:  OMNIPAQUE  IOHEXOL  300 MG/ML  SOLN COMPARISON:  07/21/2021 FINDINGS: Lower chest: Small hiatal hernia. No acute airspace disease  or pleural effusion. Hepatobiliary: Liver is prominent size spanning 17.2 cm cranial caudal with hepatic steatosis. No focal liver lesion. Minimally distended gallbladder. No calcified gallstone or pericholecystic inflammation. No biliary dilatation. Pancreas: Mild parenchymal atrophy. No ductal dilatation or inflammation. Spleen: Normal in size without focal abnormality. Adrenals/Urinary Tract: No adrenal nodules. Slight prominence of both renal collecting systems without frank hydronephrosis. Homogeneous renal enhancement with symmetric excretion on delayed phase imaging. Lobulated renal contours. No renal stone or focal lesion. The urinary bladder is moderately distended. No bladder wall thickening. Stomach/Bowel: Small hiatal hernia. No small bowel obstruction or inflammation. There is swirling of the small bowel mesentery with small-bowel loops projecting lateral to the ascending colon in the right abdomen. Slight mesenteric engorgement. No bowel pneumatosis. The appendix is not well demonstrated on the current exam. Moderate colonic stool burden. The sigmoid colon is redundant and courses into the right abdomen. No colonic wall thickening or inflammation. Vascular/Lymphatic: Left IVC. Aortic atherosclerosis. No aortic aneurysm. The portal vein is patent. No suspicious lymphadenopathy. Reproductive: Prostate is unremarkable. Other: No free air, free fluid, or intra-abdominal fluid collection. Small fat containing umbilical hernia. Minimal fat in the inguinal canals. Musculoskeletal: Degenerative change throughout the spine partial fusion of the sacroiliac joints. There are no acute or suspicious osseous abnormalities. IMPRESSION: 1. Swirling of the small bowel mesentery with small-bowel loops projecting lateral to the ascending colon in the right abdomen. Slight mesenteric engorgement. Findings are suspicious for internal hernia. No bowel obstruction or inflammation. 2. Moderate colonic stool burden with  colonic redundancy, can be seen with constipation. 3. Hepatic  steatosis. 4. Small hiatal hernia. Aortic Atherosclerosis (ICD10-I70.0). Electronically Signed   By: Andrea Gasman M.D.   On: 04/22/2024 17:21    Anti-infectives: Anti-infectives (From admission, onward)    None       Assessment/Plan: s/p * No surgery found * I would focus more on liquids given recent vomiting No sign of obstruction. I suspect mesenteric swirling may be intermittent twisting but not an internal hernia Will need GI eval given chronic vomiting No plan for surgery at this point. Will follow  LOS: 1 day    [name] 04/23/2024  "

## 2024-04-23 NOTE — Plan of Care (Signed)
   Problem: Clinical Measurements: Goal: Will remain free from infection Outcome: Progressing Goal: Diagnostic test results will improve Outcome: Progressing   Problem: Nutrition: Goal: Adequate nutrition will be maintained Outcome: Progressing   Problem: Coping: Goal: Level of anxiety will decrease Outcome: Progressing

## 2024-04-23 NOTE — Evaluation (Signed)
 Physical Therapy Evaluation Patient Details Name: [name] MRN: [id number] DOB: 1951/02/03 Today's Date: 04/23/2024  History of Present Illness  73 y.o. male presented to [facility] ED for evaluation of epigastric pain, nausea and vomiting with constipation, intermittently for about a month. Patient also endorsed history of severe pruritus from multiple medications he is allergic to. CT suspicious for internal hernia. 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 Pt ambulated 90' with RW, no loss of balance, HR 131 walking, then HR 107 after ~1 minute seated rest. He walks with a rollator at baseline, family reports he's had several falls in past 6 months. Pt has 24* assist at home from his wife. Good progress expected.  Pt currently with functional limitations due to the deficits listed below (see PT Problem List). Pt will benefit from acute skilled PT to increase their independence and safety with mobility to allow discharge.           If plan is discharge home, recommend the following: A little help with walking and/or transfers;A little help with bathing/dressing/bathroom;Assistance with cooking/housework;Assist for transportation;Help with stairs or ramp for entrance   Can travel by private vehicle        Equipment Recommendations None recommended by PT  Recommendations for Other Services       Functional Status Assessment Patient has had a recent decline in their functional status and demonstrates the ability to make significant improvements in function in a reasonable and predictable amount of time.     Precautions / Restrictions Precautions Precautions: Fall Recall of Precautions/Restrictions:  Intact Precaution/Restrictions Comments: son reports pt has had several falls in past 6 months Restrictions Weight Bearing Restrictions Per Provider Order: No      Mobility  Bed Mobility Overal bed mobility: Needs Assistance Bed Mobility: Supine to Sit     Supine to sit: Mod assist     General bed mobility comments: assist to raise trunk    Transfers Overall transfer level: Needs assistance Equipment used: Rolling walker (2 wheels) Transfers: Sit to/from Stand Sit to Stand: Min assist           General transfer comment: min A to power up    Ambulation/Gait Ambulation/Gait assistance: Contact guard assist Gait Distance (Feet): 60 Feet Assistive device: Rolling walker (2 wheels) Gait Pattern/deviations: Step-through pattern, Decreased stride length Gait velocity: decr     General Gait Details: steady, no loss of balance, HR 131 walking, then HR 107 after 1 minute seated rest  Stairs            Wheelchair Mobility     Tilt Bed    Modified Rankin (Stroke Patients Only)       Balance Overall balance assessment: Modified Independent, History of Falls                                           Pertinent Vitals/Pain Pain Assessment Pain Assessment: No/denies pain    Home Living Family/patient expects to be discharged to:: Private residence Living Arrangements: Spouse/significant other;Children Available Help at Discharge: Family;Available 24 hours/day Type of Home: House Home Access: Other (comment) (has lift in garage to enter home)  Alternate Level Stairs-Number of Steps: flight Home Layout: Two level Home Equipment: Rollator (4 wheels);Shower seat      Prior Function Prior Level of Function : Needs assist             Mobility Comments: uses rollator in the home, uses cane and has assist to do flight of stairs inside home, has lift to get in from garage ADLs Comments: independent     Extremity/Trunk Assessment    Upper Extremity Assessment Upper Extremity Assessment: Overall WFL for tasks assessed    Lower Extremity Assessment Lower Extremity Assessment: Generalized weakness;RLE deficits/detail;LLE deficits/detail RLE Deficits / Details: 4/5 knee ext RLE Sensation: WNL LLE Deficits / Details: +3/5 knee ext LLE Sensation: WNL    Cervical / Trunk Assessment Cervical / Trunk Assessment: Normal  Communication   Communication Communication: No apparent difficulties    Cognition Arousal: Alert Behavior During Therapy: WFL for tasks assessed/performed   PT - Cognitive impairments: No apparent impairments                         Following commands: Intact       Cueing       General Comments      Exercises     Assessment/Plan    PT Assessment Patient needs continued PT services  PT Problem List Cardiopulmonary status limiting activity;Decreased activity tolerance       PT Treatment Interventions Gait training;Therapeutic exercise;Therapeutic activities;Patient/family education    PT Goals (Current goals can be found in the Care Plan section)  Acute Rehab PT Goals Patient Stated Goal: get strength back PT Goal Formulation: With patient/family Time For Goal Achievement: 05/07/24 Potential to Achieve Goals: Good    Frequency Min 3X/week     Co-evaluation               AM-PAC PT 6 Clicks Mobility  Outcome Measure Help needed turning from your back to your side while in a flat bed without using bedrails?: A Little Help needed moving from lying on your back to sitting on the side of a flat bed without using bedrails?: A Lot Help needed moving to and from a bed to a chair (including a wheelchair)?: A Little Help needed standing up from a chair using your arms (e.g., wheelchair or bedside chair)?: A Little Help needed to walk in hospital room?: A Little Help needed climbing 3-5 steps with a railing? : A Lot 6 Click Score: 16    End of Session Equipment  Utilized During Treatment: Gait belt Activity Tolerance: Patient tolerated treatment well Patient left: in chair;with chair alarm set;with call bell/phone within reach;with family/visitor present Nurse Communication: Mobility status PT Visit Diagnosis: Difficulty in walking, not elsewhere classified (R26.2);Other abnormalities of gait and mobility (R26.89)    Time: 8451-8388 PT Time Calculation (min) (ACUTE ONLY): 23 min   Charges:   PT Evaluation $PT Eval Moderate Complexity: 1 Mod PT Treatments $Gait Training: 8-22 mins PT General Charges $$ ACUTE PT VISIT: 1 Visit        Sylvan Delon Copp PT 04/23/2024  Acute Rehabilitation Services  Office [phone]

## 2024-04-23 NOTE — Progress Notes (Signed)
" °   04/23/24 0154  Provider Notification  Provider Name/Title Dr. Claretta Alderman  Date Provider Notified 04/23/24  Time Provider Notified 0154  Method of Notification Page (secure chat)  Notification Reason Other (Comment) (lab result)  Test performed and critical result Troponin 144  Date Critical Result Received 04/23/24  Provider response No new orders;Evaluate remotely    "

## 2024-04-23 NOTE — Progress Notes (Addendum)
 Patient refused night medications. Stated,  I will start on these medications tomorrow, not tonight. Patient educated. Patient wife at bedside. MD made aware. Plan of care ongoing.

## 2024-04-24 ENCOUNTER — Inpatient Hospital Stay (HOSPITAL_COMMUNITY)

## 2024-04-24 DIAGNOSIS — R1013 Epigastric pain: Secondary | ICD-10-CM | POA: Diagnosis not present

## 2024-04-24 LAB — CBC
HCT: 25.7 % — ABNORMAL LOW (ref 39.0–52.0)
Hemoglobin: 8.4 g/dL — ABNORMAL LOW (ref 13.0–17.0)
MCH: 31.9 pg (ref 26.0–34.0)
MCHC: 32.7 g/dL (ref 30.0–36.0)
MCV: 97.7 fL (ref 80.0–100.0)
Platelets: 231 K/uL (ref 150–400)
RBC: 2.63 MIL/uL — ABNORMAL LOW (ref 4.22–5.81)
RDW: 14.5 % (ref 11.5–15.5)
WBC: 6.2 K/uL (ref 4.0–10.5)
nRBC: 1 % — ABNORMAL HIGH (ref 0.0–0.2)

## 2024-04-24 LAB — GLUCOSE, CAPILLARY
Glucose-Capillary: 153 mg/dL — ABNORMAL HIGH (ref 70–99)
Glucose-Capillary: 148 mg/dL — ABNORMAL HIGH (ref 70–99)
Glucose-Capillary: 201 mg/dL — ABNORMAL HIGH (ref 70–99)
Glucose-Capillary: 169 mg/dL — ABNORMAL HIGH (ref 70–99)

## 2024-04-24 LAB — BASIC METABOLIC PANEL WITH GFR
Anion gap: 11 (ref 5–15)
BUN: 8 mg/dL (ref 8–23)
CO2: 24 mmol/L (ref 22–32)
Calcium: 9.4 mg/dL (ref 8.9–10.3)
Chloride: 107 mmol/L (ref 98–111)
Creatinine, Ser: 1.16 mg/dL (ref 0.61–1.24)
GFR, Estimated: >60 mL/min
Glucose, Bld: 157 mg/dL — ABNORMAL HIGH (ref 70–99)
Potassium: 4.2 mmol/L (ref 3.5–5.1)
Sodium: 141 mmol/L (ref 135–145)

## 2024-04-24 MED ORDER — PANTOPRAZOLE SODIUM 40 MG IV SOLR
40.0000 mg | Freq: Two times a day (BID) | INTRAVENOUS | Status: DC
  Administered 2024-04-24 – 2024-04-25 (×3): 40 mg via INTRAVENOUS
  Filled 2024-04-24 (×3): qty 10

## 2024-04-24 MED ORDER — PANTOPRAZOLE SODIUM 40 MG PO TBEC
40.0000 mg | DELAYED_RELEASE_TABLET | Freq: Every day | ORAL | Status: DC
  Administered 2024-04-24: 40 mg via ORAL
  Filled 2024-04-24: qty 1

## 2024-04-24 MED ORDER — CICLOPIROX OLAMINE 0.77 % EX CREA: TOPICAL_CREAM | Freq: Two times a day (BID) | CUTANEOUS | Status: DC

## 2024-04-24 MED ORDER — SUCRALFATE 1 G PO TABS
1.0000 g | ORAL_TABLET | Freq: Two times a day (BID) | ORAL | Status: DC
  Administered 2024-04-24 – 2024-04-25 (×3): 1 g via ORAL
  Filled 2024-04-24 (×3): qty 1

## 2024-04-24 NOTE — Plan of Care (Signed)

## 2024-04-24 NOTE — Progress Notes (Signed)
" °   04/24/24 1320  Spiritual Encounters  Type of Visit Initial  Care provided to: Pt and family  Reason for visit Advance directives  OnCall Visit No    Chaplain responded to spiritual care consult for advance directives. Pt [name] requested I return when his wife [name] arrives. Son bedside. "

## 2024-04-24 NOTE — Progress Notes (Signed)
 "    Subjective/Chief Complaint: Got delirious with mediciine overnight. Feels ok today. No bm yet   Objective: Vital signs in last 24 hours: Temp:  [97.3 F (36.3 C)-98 F (36.7 C)] 97.3 F (36.3 C) (12/26 0714) Pulse Rate:  [96-131] 96 (12/26 0714) Resp:  [20] 20 (12/26 0714) BP: (139-155)/(77-95) 147/87 (12/26 0714) SpO2:  [97 %-100 %] 97 % (12/26 0714) Last BM Date : 04/21/24  Intake/Output from previous day: 12/25 0701 - 12/26 0700 In: 457 [P.O.:457] Out: 250 [Urine:250] Intake/Output this shift: No intake/output data recorded.  General appearance: alert and cooperative Resp: clear to auscultation bilaterally Cardio: regular rate and rhythm GI: soft, minimal tenderness  Lab Results:  Recent Labs    04/23/24 0014 04/24/24 0548  WBC 7.5 6.2  HGB 9.8* 8.4*  HCT 29.3* 25.7*  PLT 295 231   BMET Recent Labs    04/22/24 1344 04/23/24 0701  NA 140 146*  K 4.3 4.6  CL 101 108  CO2 27 28  GLUCOSE 139* 111*  BUN 12 8  CREATININE 1.36* 1.25*  CALCIUM  10.1 10.2   PT/INR No results for input(s): LABPROT, INR in the last 72 hours. ABG No results for input(s): PHART, HCO3 in the last 72 hours.  Invalid input(s): PCO2, PO2  Studies/Results: DG Abd 2 Views Result Date: 04/23/2024 CLINICAL DATA:  Small bowel obstruction. EXAM: ABDOMEN - 2 VIEW COMPARISON:  CT abdomen pelvis 04/22/2024. FINDINGS: Mild gaseous distention of small bowel with moderate to severe stool burden. Gas in the rectum. No unexpected radiopaque calculi. Contrast is seen in a distended bladder. IMPRESSION: Mild gaseous distension of bowel in the central abdomen with moderate to severe stool burden and gas in the rectum. Findings may be due to a mild ileus related to constipation. CT abdomen pelvis performed yesterday. Electronically Signed   By: Newell Eke M.D.   On: 04/23/2024 10:29   CT ABDOMEN PELVIS W CONTRAST Result Date: 04/22/2024 CLINICAL DATA:  Acute abdominal pain.  EXAM: CT ABDOMEN AND PELVIS WITH CONTRAST TECHNIQUE: Multidetector CT imaging of the abdomen and pelvis was performed using the standard protocol following bolus administration of intravenous contrast. RADIATION DOSE REDUCTION: This exam was performed according to the departmental dose-optimization program which includes automated exposure control, adjustment of the mA and/or kV according to patient size and/or use of iterative reconstruction technique. CONTRAST:  OMNIPAQUE  IOHEXOL  300 MG/ML  SOLN COMPARISON:  07/21/2021 FINDINGS: Lower chest: Small hiatal hernia. No acute airspace disease or pleural effusion. Hepatobiliary: Liver is prominent size spanning 17.2 cm cranial caudal with hepatic steatosis. No focal liver lesion. Minimally distended gallbladder. No calcified gallstone or pericholecystic inflammation. No biliary dilatation. Pancreas: Mild parenchymal atrophy. No ductal dilatation or inflammation. Spleen: Normal in size without focal abnormality. Adrenals/Urinary Tract: No adrenal nodules. Slight prominence of both renal collecting systems without frank hydronephrosis. Homogeneous renal enhancement with symmetric excretion on delayed phase imaging. Lobulated renal contours. No renal stone or focal lesion. The urinary bladder is moderately distended. No bladder wall thickening. Stomach/Bowel: Small hiatal hernia. No small bowel obstruction or inflammation. There is swirling of the small bowel mesentery with small-bowel loops projecting lateral to the ascending colon in the right abdomen. Slight mesenteric engorgement. No bowel pneumatosis. The appendix is not well demonstrated on the current exam. Moderate colonic stool burden. The sigmoid colon is redundant and courses into the right abdomen. No colonic wall thickening or inflammation. Vascular/Lymphatic: Left IVC. Aortic atherosclerosis. No aortic aneurysm. The portal vein is patent. No  suspicious lymphadenopathy. Reproductive: Prostate is  unremarkable. Other: No free air, free fluid, or intra-abdominal fluid collection. Small fat containing umbilical hernia. Minimal fat in the inguinal canals. Musculoskeletal: Degenerative change throughout the spine partial fusion of the sacroiliac joints. There are no acute or suspicious osseous abnormalities. IMPRESSION: 1. Swirling of the small bowel mesentery with small-bowel loops projecting lateral to the ascending colon in the right abdomen. Slight mesenteric engorgement. Findings are suspicious for internal hernia. No bowel obstruction or inflammation. 2. Moderate colonic stool burden with colonic redundancy, can be seen with constipation. 3. Hepatic steatosis. 4. Small hiatal hernia. Aortic Atherosclerosis (ICD10-I70.0). Electronically Signed   By: Andrea Gasman M.D.   On: 04/22/2024 17:21    Anti-infectives: Anti-infectives (From admission, onward)    None       Assessment/Plan: s/p * No surgery found * Possible small bowel volvulus Does not appear to be obstructed Continue to treat constipation May need GI eval with h/o vomiting for last couple months Continue fulls until bowel function returns  LOS: 2 days    Deward Null III 04/24/2024  "

## 2024-04-24 NOTE — Progress Notes (Addendum)
 At [phone] patient was complaining of severe neuropathy pain. Per patient and wife, takes Cymbalta  at home to manage. Cymbalta  administered as scheduled. Fentanyl , 25mcg IV push, offered to patient. Patient's wife expressed concern with the safety of this medication and the patient refused it at this time. Education regarding fentanyl  was provided.This RN hollace Cage, MD to request medication to address neuropathy pain.   During morning medication administration at 10 am, patient was asleep. When awoken to take medications, began expressing neuropathy pain but was able to fall back asleep.  During rounding at noon, patient was sitting on the side of the bed, rocking back and forth with his hand sitting in a cup of ice water once again endorsing severe neuropathy pain. This RN again offered the patient IV fentanyl , to which the patients son immediately refused. Again this RN attempted to educate patient and family about fentanyl  in a hospital setting but patient was still not comfortable taking it.   Provider notified again of severe pain, and unwillingness to take fentanyl .  Addendum 1319: This RN made aware of UGI and small bowel follow through study ordered and schedule for approx 2 pm. Patient still complaining of 10/10 neuropathy pain. Another attempt to educate the patient and family regarding fentanyl  was made. This time patient was agreeable. 25mcg of fentanyl  administered to pt. During pain reassessment patient stated he was not experiencing any side effects, and that his pain was reduced.

## 2024-04-24 NOTE — Progress Notes (Signed)
 OT Cancellation Note  Patient Details Name: [name] MRN: [id number] DOB: 1951-03-09   Cancelled Treatment:    Reason Eval/Treat Not Completed: Patient at procedure or test/ unavailable  Patient off the floor at a test. OT will continue to follow for initial eval as soon as schedule allows.   Chancellor Vanderloop OT/L Acute Rehabilitation Department  [phone]    04/24/2024, 2:31 PM

## 2024-04-24 NOTE — Progress Notes (Signed)
" °   04/24/24 1440  Spiritual Encounters  Type of Visit Follow up  Care provided to: Family;Pt not available  Reason for visit Advance directives  OnCall Visit No    Chaplain returned to follow up on AD paperwork, however pt [name] was not in room. Son was bedside with pt's wife, [name], on the phone. They were open to receiving the paperwork and education, which I happily provided. All questions answered at this time.  [name] mentioned [name] is a man of prayer and would appreciate another chaplain visit. We will follow up, and continue to remain available as further needs arise. "

## 2024-04-24 NOTE — Progress Notes (Addendum)
 "  PROGRESS NOTE  [name] FMW:[id number] DOB: 19-Mar-1951 DOA: 04/22/2024 PCP: Alvia Bring, DO  HPI/Recap of past 24 hours: [name] is a 73 y.o. 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 Patient also endorsed history of severe pruritus from multiple medications he is allergic to, now recently morphine  which he received in the ED with now uncontrollable itching. Spouse reports that patient is scheduled to have an abdominal ultrasound, endoscopy and colonoscopy in January for further evaluation of his abdominal symptoms. In the ED, patient noted to be tachypneic/tachycardic, otherwise WNL. Initial labs significant for creatinine 1.36, WBC 7.8, Hgb 9.6, troponin 124-133, UA with no signs of infection. CT A/P shows findings suspicious for internal hernia, moderate colonic stool burden and hepatic steatosis.  EDP discussed with cardiology due to elevated troponin, no further workup.  General surgery consulted.  Patient admitted for further management.    Today, met patient sleeping, easily arousable but intermittently confused likely 2/2 multiple doses of Benadryl  given yesterday for significant pruritus.  Reported neuropathic pain in upper extremity, initially refusing IV fentanyl , eventually agreed to have as patient is allergic to multiple medications including gabapentin .  GI consulted.    Assessment/Plan: Principal Problem:   Epigastric pain Active Problems:   Autoimmune myositis of skeletal muscle   Benign prostatic hyperplasia   CKD stage 3a, GFR 45-59 ml/min (HCC)   Internal hernia   Heart failure with recovered  ejection fraction (HFrecEF) (HCC)   Uncontrolled type 2 diabetes mellitus with hyperglycemia, with long-term current use of insulin  (HCC)   Intractable nausea and vomiting   Epigastric pain/intractable nausea,vomiting Constipation ??Internal hernia CT A/P showing some swirling of the small bowel mesentery and slight mesenteric engorgement suspicious for internal hernia but no bowel obstruction or inflammation Abdominal x-ray with moderate to severe stool burden and gas in the rectum with mild ileus related to constipation General Surgery following, no surgical intervention indicated at the moment GI consulted, recommend upper GI series with small bowel follow-through, plan to hold off on EGD due to concern for small bowel ileus/mesenteric inflammation Continue bowel regimen with MiraLAX , DC lactulose  as per GI,  pain management IV PPI, sucralfate  as per GI  Pruritus Reports a history of pruritus from multiple medications such as doxycycline , hydrocodone and hydroxyzine , now morphine  Follows with dermatology, recommended triamcinolone  cream, ciclopirox  olamine (not available in our pharmacy), Desitin Continue triamcinolone  cream, Desitin, IV Benadryl  prn  Elevated troponin Troponin elevated to 124-133, patient without any chest pain, EKG with no ischemic changes Likely demand ischemia in the setting of persistent abdominal pain, internal hernia and dehydration Cardiology consulted by EDP, they do not think this is likely ACS but likely stress response from his internal hernia Telemetry   T2DM with hyperglycemia, with neuropathy A1c 7.5 on 04/23/2024 SSI, Accu-Cheks, hypoglycemic protocol Continue Cymbalta    HFpEF Recent TTE in 08/2022 showed recovered EF to 60-65%, mild LVH and no valvular abnormality Appears dry Hold torsemide    CKD 3A Creatinine baseline around 1.3-1.4 Trend renal function, avoid nephrotoxic agents  Anemia of chronic disease Hemoglobin baseline around  9.9 Daily CBC  History of PE/DVT On Eliquis , last dose 04/21/2024 Continue to hold  Autoimmune myositis Necrotizing myopathy Follows with  neurology at [facility] Continue baclofen  and prednisone    Hypothyroidism Continue Synthroid    Anxiety and depression Continue duloxetine  and Seroquel    BPH Continue oxybutynin  and tamsulosin    Generalized weakness Dehydration Secondary to persistent nausea and vomiting Continue IVF PT/OT eval and treat  Obesity Lifestyle modification advised     Estimated body mass index is 32.39 kg/m as calculated from the following:   Height as of this encounter: 5' 8 (1.727 m).   Weight as of this encounter: 96.6 kg.     Code Status: Full  Family Communication: Wife at bedside  Disposition Plan: Status is: Inpatient Remains inpatient appropriate because: Level of care      Consultants: General surgery GI  Procedures: None  Antimicrobials: None  DVT prophylaxis: SCDs   Objective: Vitals:   04/23/24 1700 04/23/24 2211 04/24/24 0714 04/24/24 1331  BP:  (!) 155/95 (!) 147/87 122/76  Pulse: (!) 104 (!) 101 96 (!) 110  Resp:  20 20 20   Temp:  97.8 F (36.6 C) (!) 97.3 F (36.3 C) 98 F (36.7 C)  TempSrc:    Oral  SpO2:  100% 97% 99%  Weight:      Height:       No intake or output data in the 24 hours ending 04/24/24 1925  Filed Weights   04/22/24 1340  Weight: 96.6 kg    Exam: General: NAD Cardiovascular: S1, S2 present Respiratory: CTAB Abdomen: Soft, nontender, nondistended, bowel sounds present Musculoskeletal: No bilateral pedal edema noted Skin: Rashes/Whirls noted Psychiatry: Normal mood     Data Reviewed: CBC: Recent Labs  Lab 04/22/24 1344 04/23/24 0014 04/24/24 0548  WBC 7.8 7.5 6.2  HGB 9.6* 9.8* 8.4*  HCT 28.6* 29.3* 25.7*  MCV 93.8 95.1 97.7  PLT 281 295 231   Basic Metabolic Panel: Recent Labs  Lab 04/22/24 1344 04/23/24 0701 04/24/24 0548  NA 140 146* 141  K 4.3 4.6 4.2  CL  101 108 107  CO2 27 28 24   GLUCOSE 139* 111* 157*  BUN 12 8 8   CREATININE 1.36* 1.25* 1.16  CALCIUM  10.1 10.2 9.4   GFR: Estimated Creatinine Clearance: 62 mL/min (by C-G formula based on SCr of 1.16 mg/dL). Liver Function Tests: Recent Labs  Lab 04/22/24 1344 04/23/24 0701  AST 25 22  ALT 16 11  ALKPHOS 64 75  BILITOT 0.7 0.6  PROT 6.2* 5.8*  ALBUMIN 4.0 3.7   Recent Labs  Lab 04/22/24 1344  LIPASE <10*   No results for input(s): AMMONIA in the last 168 hours. Coagulation Profile: No results for input(s): INR, PROTIME in the last 168 hours. Cardiac Enzymes: No results for input(s): CKTOTAL, CKMB, CKMBINDEX, TROPONINI in the last 168 hours. BNP (last 3 results) No results for input(s): PROBNP in the last 8760 hours. HbA1C: Recent Labs    04/23/24 0014  HGBA1C 7.5*   CBG: Recent Labs  Lab 04/23/24 1653 04/23/24 2213 04/24/24 0750 04/24/24 1145 04/24/24 1708  GLUCAP 222* 128* 153* 148* 201*   Lipid Profile: No results for input(s): CHOL, HDL, LDLCALC, TRIG, CHOLHDL, LDLDIRECT in the last 72 hours. Thyroid  Function Tests: No results for input(s): TSH, T4TOTAL, FREET4, T3FREE, THYROIDAB in the last 72 hours. Anemia Panel: No results for input(s): VITAMINB12, FOLATE, FERRITIN, TIBC, IRON, RETICCTPCT in the last 72 hours. Urine analysis:    Component Value Date/Time   COLORURINE YELLOW 04/22/2024 1733   APPEARANCEUR CLEAR 04/22/2024 1733   LABSPEC 1.015 04/22/2024 1733   PHURINE 8.0 04/22/2024 1733  GLUCOSEU NEGATIVE 04/22/2024 1733   HGBUR NEGATIVE 04/22/2024 1733   BILIRUBINUR NEGATIVE 04/22/2024 1733   KETONESUR NEGATIVE 04/22/2024 1733   PROTEINUR NEGATIVE 04/22/2024 1733   UROBILINOGEN 0.2 09/28/2014 1555   NITRITE NEGATIVE 04/22/2024 1733   LEUKOCYTESUR NEGATIVE 04/22/2024 1733   Sepsis Labs: @LABRCNTIP (procalcitonin:4,lacticidven:4)  )No results found for this or any previous visit (from the  past 240 hours).    Studies: DG UGI W SMALL BOWEL Result Date: 04/24/2024 CLINICAL DATA:  Chronic vomiting.  Abnormal CT exam EXAM: UPPER GI SERIES WITH SMALL BOWEL FOLLOW-THROUGH FLUOROSCOPY: Radiation Exposure Index (as provided by the fluoroscopic device): 1 05 mGy Kerma TECHNIQUE: Combined double contrast and single contrast upper GI series using effervescent crystals, thick barium, and thin barium. Subsequently, serial images of the small bowel were obtained including spot views of the terminal ileum. COMPARISON:  CT exam 04/23/2019 FINDINGS: Normal anatomy of the esophagus stomach and duodenum. Oral contrast was very slow to empty the stomach. The duodenum crosses the spine in normal fashion ligament Treitz LEFT of the spine. The oral contrast transits entirety of the small bowel in 1 hour 15 minutes. Loops of small bowel were lateral to the ascending colon without evidence obstruction. No inflammation of the small bowel. Normal jejunal pattern and ileal pattern. Normal terminal ileum. IMPRESSION: 1. Normal anatomy of the esophagus, stomach, and duodenum. 2. Very slow emptying of the stomach. Recommend correlation for gastroparesis 3. Normal small bowel follow-through. Electronically Signed   By: Jackquline Boxer M.D.   On: 04/24/2024 16:41   DG Fluoro Rm 1-60 Min - No Report Result Date: 04/24/2024 Fluoroscopy was utilized by the requesting physician.  No radiographic interpretation.   DG Fluoro Rm 1-60 Min - No Report Result Date: 04/24/2024 Fluoroscopy was utilized by the requesting physician.  No radiographic interpretation.    Scheduled Meds:  baclofen   20 mg Oral BID   bisacodyl   10 mg Rectal Once   calcium  carbonate  1,250 mg Oral Daily   ciclopirox    Topical BID   docusate  50 mg Oral Daily   DULoxetine   30 mg Oral Daily   folic acid   2 mg Oral Daily   insulin  aspart  0-15 Units Subcutaneous TID WC   insulin  aspart  0-5 Units Subcutaneous QHS   latanoprost   1 drop Both Eyes  QHS   levothyroxine   75 mcg Oral Q0600   loratadine   10 mg Oral Daily   oxybutynin   5 mg Oral TID   pantoprazole  (PROTONIX ) IV  40 mg Intravenous Q12H   predniSONE   8 mg Oral Daily   QUEtiapine   50 mg Oral QHS   senna-docusate  2 tablet Oral QHS   sucralfate   1 g Oral BID   tamsulosin   0.4 mg Oral Daily   triamcinolone  0.1 % cream : eucerin   Topical BID    Continuous Infusions:     LOS: 2 days     Lebron JINNY Cage, MD [facility]  If 7PM-7AM, please contact night-coverage [email or web address] 04/24/2024, 7:25 PM    "

## 2024-04-24 NOTE — Consult Note (Signed)
 Referring Provider: TH Primary Care Physician:  Alvia Bring, DO Primary Gastroenterologist: Unassigned/[facility]  Reason for Consultation: Abdominal pain, nausea and vomiting  HPI: [name] is a 73 y.o. 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  Patient having intermittent symptoms for several weeks now.  Was seen by [facility] and was scheduled for outpatient EGD and colonoscopy in January.  Because of ongoing symptoms he presented to the hospital.  Upon initial evaluation he was found to have stable CBC with hemoglobin of around 9.6, normal LFTs, stable creatinine of 1.36, normal lipase.  Mildly elevated troponins.  CT abdomen pelvis with IV contrast showed shortening of the small bowel mesentery with small bowel loops projecting lateral to the ascending colon concerning for internal hernia.  CT also showed constipation, small hiatal hernia and fatty liver.  Surgery is following.  GI is consulted for further evaluation of chronic nausea and vomiting.  Patient seen and examined at bedside.  Patient's son is at bedside.  History also obtained by calling patient's wife over the phone.  He has been having intermittent nausea and vomiting for more than 1 month now.  Sometimes smell of food itself can trigger nausea and vomiting.  He is complaining of chronic constipation and hard stools.  Denies seeing any bright red blood per rectum or black tarry stools.  Occasional dark stools.  Complaining of central abdominal pain which has been intermittent in nature.   Past Medical History:  Diagnosis Date   Atopic dermatitis 12/09/2022   Autoimmune disease    Bronchitis    Chronic HFrEF (heart failure with reduced ejection fraction) (HCC)    normalized EF   Chronic hiccups    Chronic  kidney disease, stage 3a (HCC)    DM2 (diabetes mellitus, type 2) (HCC) 09/28/2014   DVT (deep venous thrombosis) (HCC)    GERD (gastroesophageal reflux disease)    Hypogonadism in male 04/01/2018   Hypothyroidism    Joint pain    Necrotizing myopathy    Nonobstructive atherosclerosis of coronary artery    Posttraumatic stress disorder 01/30/2022   PTSD (post-traumatic stress disorder)    Pulmonary embolism (HCC)    Rheumatoid arthritis (HCC) 08/09/2021   RSV (respiratory syncytial virus pneumonia) 03/11/2022    Past Surgical History:  Procedure Laterality Date   BIOPSY SHOULDER     Left   IR ANGIOGRAM PULMONARY BILATERAL SELECTIVE  07/22/2021   IR THROMBECT VENO MECH MOD SED  07/22/2021   IR US  GUIDE VASC ACCESS LEFT  07/22/2021   RADIOLOGY WITH ANESTHESIA Right 07/22/2021   Procedure: IR WITH ANESTHESIA;  Surgeon: Radiologist, Medication, MD;  Location: [facility] OR;  Service: Radiology;  Laterality: Right;   UPPER LEG SOFT TISSUE BIOPSY Right     Prior to Admission medications  Medication Sig Start Date End Date Taking? Authorizing Provider  acetaminophen  (TYLENOL ) 500 MG tablet Take 500-1,000 mg by mouth See admin instructions. Take 1000 mg by mouth in the morning and 500 mg at bedtime   Yes [provider]  AMBULATORY NON FORMULARY MEDICATION Please provide 3 in 1 bedside commode.  Dx:R29.6, M62.81 04/16/22  Yes Alvia Bring, DO  AMBULATORY NON FORMULARY MEDICATION Please provide bilateral thigh high compression stockings: Graded compression of 20-15mmhg 09/05/22  Yes Alvia Bring, DO  apixaban  (ELIQUIS ) 5 MG TABS tablet Take 1  tablet (5 mg total) by mouth 2 (two) times daily. 08/09/22  Yes Vannie Reche RAMAN, NP  baclofen  (LIORESAL ) 20 MG tablet TAKE 1 TABLET BY MOUTH TWICE A DAY 01/30/22  Yes Alvia Bring, DO  bisacodyl  (DULCOLAX) 10 MG suppository Place 1 suppository (10 mg total) rectally as needed for moderate constipation. 08/20/23  Yes Francesca Elsie CROME, MD  calcium   carbonate (OS-CAL) 600 MG TABS tablet Take 600 mg by mouth daily.   Yes [provider]  Calcium  Carbonate Antacid (TUMS PO) Take 3 tablets by mouth 2 (two) times daily as needed (heartburn).   Yes [provider]  cetirizine  (ZYRTEC ) 10 MG tablet Take 1 tablet (10 mg total) by mouth daily. 01/02/24  Yes Maranda Jamee Jacob, MD  ciclopirox  (LOPROX ) 0.77 % cream Apply topically. 02/12/24  Yes [provider]  docusate (COLACE) 50 MG/5ML liquid Take 50 mg by mouth daily.   Yes [provider]  DULoxetine  (CYMBALTA ) 30 MG capsule Take 30 mg by mouth daily.   Yes [provider]  EPINEPHrine  0.3 mg/0.3 mL IJ SOAJ injection Inject 0.3 mg into the muscle as needed for anaphylaxis. 12/26/23  Yes Tegeler, Lonni PARAS, MD  Evolocumab  (REPATHA  SURECLICK) 140 MG/ML SOAJ Inject 140 mg into the skin every 14 (fourteen) days. 06/25/23  Yes Vannie Reche RAMAN, NP  folic acid  (FOLVITE ) 1 MG tablet Take 2 mg by mouth daily.   Yes [provider]  insulin  aspart (NOVOLOG  FLEXPEN) 100 UNIT/ML FlexPen Inject 15 Units into the skin 3 (three) times daily with meals.   Yes [provider]  insulin  glargine (LANTUS ) 100 UNIT/ML injection Inject 0.38 mLs (38 Units total) into the skin daily. 09/15/22  Yes Cheryle Page, MD  lactulose  (CHRONULAC ) 10 GM/15ML solution Take 15 mLs (10 g total) by mouth 3 (three) times daily. 08/20/23  Yes Francesca Elsie CROME, MD  latanoprost  (XALATAN ) 0.005 % ophthalmic solution 1 drop at bedtime. 08/17/22  Yes [provider]  Levothyroxine  Sodium 75 MCG CAPS Take 75 mcg by mouth daily.   Yes [provider]  metFORMIN (GLUCOPHAGE-XR) 500 MG 24 hr tablet Take 500 mg by mouth 2 (two) times daily with a meal. 02/12/24  Yes [provider]  methotrexate  (RHEUMATREX) 5 MG tablet Take 3 tablets by mouth once a week. Caution: Chemotherapy. Protect from light. Patient taking differently: Take 2.5 mg by mouth once a  week. Caution: Chemotherapy. Protect from light.  Takes 6 tablets (15 mg total) by mouth every 7 days   Yes [provider]  NON FORMULARY Apply 500 mg topically daily.   Yes [provider]  omeprazole (PRILOSEC) 20 MG capsule Take 20 mg by mouth daily.   Yes [provider]  oxybutynin  (DITROPAN ) 5 MG tablet Take 5 mg by mouth 3 (three) times daily.   Yes [provider]  polyethylene glycol powder (MIRALAX ) 17 GM/SCOOP powder Take 17 g by mouth in the morning, at noon, and at bedtime. Take TID until having regular bowel movement, then QD 08/20/23  Yes Scheving, Elsie CROME, MD  polyvinyl alcohol  (LIQUIFILM TEARS) 1.4 % ophthalmic solution Place 1 drop into both eyes in the morning and at bedtime.   Yes [provider]  PREDNISONE  PO Take 8 mg by mouth daily.   Yes [provider]  QUEtiapine  (SEROQUEL ) 50 MG tablet Take by mouth as directed. 09/21/22  Yes [provider]  sertraline  (ZOLOFT ) 25 MG tablet Take 2 tablets (50 mg total) by mouth daily.  Take 50mg  daily. Patient taking differently: Take 50 mg by mouth daily. Take 50mg  daily. Taking as needed 09/15/22  Yes Cheryle Page, MD  SUMAtriptan (IMITREX) 100 MG tablet TAKE ONE TABLET BY MOUTH AS DIRECTED FOR MIGRAINE HEADACHE (FIRST DOSE AT ONSET OF HEADACHE,THEN REPEAT AFTER 2 HOURS IF NO RELIEF-NO MORE THAN 200 MG PER DAY) 03/20/24  Yes [provider]  tamsulosin  (FLOMAX ) 0.4 MG CAPS capsule Take 0.4 mg by mouth daily. 09/21/22  Yes [provider]  torsemide  (DEMADEX ) 20 MG tablet Take 1 tablet (20 mg total) by mouth daily. May take additional tablet as directed by lymphedema clinic or cardiologist. 11/11/23  Yes Daneen Damien BROCKS, NP  amitriptyline  (ELAVIL ) 25 MG tablet Take 1 tab po qhs Patient not taking: Reported on 04/17/2024 03/13/23   Alvia Bring, DO  Evolocumab  (REPATHA  SURECLICK) 140 MG/ML SOAJ Inject 140 mg into the skin every 14 (fourteen) days. Patient  not taking: Reported on 04/22/2024 10/09/23   Daneen Damien BROCKS, NP    Scheduled Meds:  baclofen   20 mg Oral BID   bisacodyl   10 mg Rectal Once   calcium  carbonate  1,250 mg Oral Daily   ciclopirox    Topical BID   docusate  50 mg Oral Daily   DULoxetine   30 mg Oral Daily   folic acid   2 mg Oral Daily   insulin  aspart  0-15 Units Subcutaneous TID WC   insulin  aspart  0-5 Units Subcutaneous QHS   lactulose   10 g Oral TID   latanoprost   1 drop Both Eyes QHS   levothyroxine   75 mcg Oral Q0600   loratadine   10 mg Oral Daily   oxybutynin   5 mg Oral TID   pantoprazole   40 mg Oral Daily   predniSONE   8 mg Oral Daily   QUEtiapine   50 mg Oral QHS   senna-docusate  2 tablet Oral QHS   tamsulosin   0.4 mg Oral Daily   triamcinolone  0.1 % cream : eucerin   Topical BID   Continuous Infusions: PRN Meds:.acetaminophen  **OR** acetaminophen , camphor-menthol , diphenhydrAMINE , fentaNYL  (SUBLIMAZE ) injection, liver oil-zinc  oxide, ondansetron  **OR** ondansetron  (ZOFRAN ) IV, polyethylene glycol  Allergies as of 04/22/2024 - Review Complete 04/22/2024  Allergen Reaction Noted   Doxycycline  Itching 04/17/2024   Hydroxyzine  Other (See Comments) 04/17/2024   Empagliflozin  09/19/2023   Morphine  Itching 04/22/2024   Semaglutide Diarrhea and Rash 11/12/2017   Benazepril Hives and Cough 08/09/2004   Cephalexin  Hives 01/14/2024   Gabapentin  Swelling 07/21/2021   Hydrocodone Hives and Itching 07/08/2013   Shellfish allergy Diarrhea and Nausea And Vomiting 07/08/2013    Family History  Problem Relation Age of Onset   Hypertension Mother    Diabetes Mother    Arthritis Mother    Depression Mother    Heart attack Father    Hypertension Father     Social History   Socioeconomic History   Marital status: Married    Spouse name: Luke   Number of children: 4   Years of education: 16   Highest education level: Master's degree (e.g., MA, MS, MEng, MEd, MSW, MBA)  Occupational History   Occupation:  Retired  Tobacco Use   Smoking status: Never   Smokeless tobacco: Never  Vaping Use   Vaping status: Never Used  Substance and Sexual Activity   Alcohol  use: No   Drug use: No   Sexual activity: Not on file  Other Topics Concern   Not on file  Social History Narrative   Lives at  home with his wife. He enjoys playing sodoku, playing cards and reading.   Social Drivers of Health   Tobacco Use: Low Risk (04/22/2024)   Patient History    Smoking Tobacco Use: Never    Smokeless Tobacco Use: Never    Passive Exposure: Not on file  Financial Resource Strain: Low Risk (08/14/2022)   Overall Financial Resource Strain (CARDIA)    Difficulty of Paying Living Expenses: Not hard at all  Food Insecurity: No Food Insecurity (04/22/2024)   Epic    Worried About Radiation Protection Practitioner of Food in the Last Year: Never true    Ran Out of Food in the Last Year: Never true  Transportation Needs: No Transportation Needs (04/22/2024)   Epic    Lack of Transportation (Medical): No    Lack of Transportation (Non-Medical): No  Physical Activity: Insufficiently Active (08/14/2022)   Exercise Vital Sign    Days of Exercise per Week: 3 days    Minutes of Exercise per Session: 20 min  Stress: No Stress Concern Present (08/14/2022)   [employer] of Occupational Health - Occupational Stress Questionnaire    Feeling of Stress : Not at all  Social Connections: Socially Isolated (04/22/2024)   Social Connection and Isolation Panel    Frequency of Communication with Friends and Family: Once a week    Frequency of Social Gatherings with Friends and Family: Once a week    Attends Religious Services: Never    Database Administrator or Organizations: No    Attends Banker Meetings: Never    Marital Status: Married  Catering Manager Violence: Not At Risk (04/22/2024)   Epic    Fear of Current or Ex-Partner: No    Emotionally Abused: No    Physically Abused: No    Sexually Abused: No  Depression  (PHQ2-9): Low Risk (08/14/2022)   Depression (PHQ2-9)    PHQ-2 Score: 0  Alcohol  Screen: Low Risk (08/14/2022)   Alcohol  Screen    Last Alcohol  Screening Score (AUDIT): 0  Housing: Low Risk (04/22/2024)   Epic    Unable to Pay for Housing in the Last Year: No    Number of Times Moved in the Last Year: 0    Homeless in the Last Year: No  Utilities: Not At Risk (04/22/2024)   Epic    Threatened with loss of utilities: No  Health Literacy: Not on file    Review of Systems: All negative except as stated above in HPI.  Physical Exam: Vital signs: Vitals:   04/23/24 2211 04/24/24 0714  BP: (!) 155/95 (!) 147/87  Pulse: (!) 101 96  Resp: 20 20  Temp: 97.8 F (36.6 C) (!) 97.3 F (36.3 C)  SpO2: 100% 97%   Last BM Date : 04/21/24 General: Sitting in a chair, complaining of neuropathic pain in the upper extremity  lungs: No visible respiratory distress Heart:  Regular rate and rhythm; no murmurs, clicks, rubs,  or gallops. Abdomen: Soft, nontender, nondistended, bowel sound present, no peritoneal signs Mood and affect normal Alert and oriented x 3 Rectal:  Deferred  GI:  Lab Results: Recent Labs    04/22/24 1344 04/23/24 0014 04/24/24 0548  WBC 7.8 7.5 6.2  HGB 9.6* 9.8* 8.4*  HCT 28.6* 29.3* 25.7*  PLT 281 295 231   BMET Recent Labs    04/22/24 1344 04/23/24 0701 04/24/24 0548  NA 140 146* 141  K 4.3 4.6 4.2  CL 101 108 107  CO2 27 28 24  GLUCOSE 139* 111* 157*  BUN 12 8 8   CREATININE 1.36* 1.25* 1.16  CALCIUM  10.1 10.2 9.4   LFT Recent Labs    04/23/24 0701  PROT 5.8*  ALBUMIN 3.7  AST 22  ALT 11  ALKPHOS 75  BILITOT 0.6   PT/INR No results for input(s): LABPROT, INR in the last 72 hours.   Studies/Results: DG Abd 2 Views Result Date: 04/23/2024 CLINICAL DATA:  Small bowel obstruction. EXAM: ABDOMEN - 2 VIEW COMPARISON:  CT abdomen pelvis 04/22/2024. FINDINGS: Mild gaseous distention of small bowel with moderate to severe stool burden.  Gas in the rectum. No unexpected radiopaque calculi. Contrast is seen in a distended bladder. IMPRESSION: Mild gaseous distension of bowel in the central abdomen with moderate to severe stool burden and gas in the rectum. Findings may be due to a mild ileus related to constipation. CT abdomen pelvis performed yesterday. Electronically Signed   By: Newell Eke M.D.   On: 04/23/2024 10:29   CT ABDOMEN PELVIS W CONTRAST Result Date: 04/22/2024 CLINICAL DATA:  Acute abdominal pain. EXAM: CT ABDOMEN AND PELVIS WITH CONTRAST TECHNIQUE: Multidetector CT imaging of the abdomen and pelvis was performed using the standard protocol following bolus administration of intravenous contrast. RADIATION DOSE REDUCTION: This exam was performed according to the departmental dose-optimization program which includes automated exposure control, adjustment of the mA and/or kV according to patient size and/or use of iterative reconstruction technique. CONTRAST:  OMNIPAQUE  IOHEXOL  300 MG/ML  SOLN COMPARISON:  07/21/2021 FINDINGS: Lower chest: Small hiatal hernia. No acute airspace disease or pleural effusion. Hepatobiliary: Liver is prominent size spanning 17.2 cm cranial caudal with hepatic steatosis. No focal liver lesion. Minimally distended gallbladder. No calcified gallstone or pericholecystic inflammation. No biliary dilatation. Pancreas: Mild parenchymal atrophy. No ductal dilatation or inflammation. Spleen: Normal in size without focal abnormality. Adrenals/Urinary Tract: No adrenal nodules. Slight prominence of both renal collecting systems without frank hydronephrosis. Homogeneous renal enhancement with symmetric excretion on delayed phase imaging. Lobulated renal contours. No renal stone or focal lesion. The urinary bladder is moderately distended. No bladder wall thickening. Stomach/Bowel: Small hiatal hernia. No small bowel obstruction or inflammation. There is swirling of the small bowel mesentery with small-bowel  loops projecting lateral to the ascending colon in the right abdomen. Slight mesenteric engorgement. No bowel pneumatosis. The appendix is not well demonstrated on the current exam. Moderate colonic stool burden. The sigmoid colon is redundant and courses into the right abdomen. No colonic wall thickening or inflammation. Vascular/Lymphatic: Left IVC. Aortic atherosclerosis. No aortic aneurysm. The portal vein is patent. No suspicious lymphadenopathy. Reproductive: Prostate is unremarkable. Other: No free air, free fluid, or intra-abdominal fluid collection. Small fat containing umbilical hernia. Minimal fat in the inguinal canals. Musculoskeletal: Degenerative change throughout the spine partial fusion of the sacroiliac joints. There are no acute or suspicious osseous abnormalities. IMPRESSION: 1. Swirling of the small bowel mesentery with small-bowel loops projecting lateral to the ascending colon in the right abdomen. Slight mesenteric engorgement. Findings are suspicious for internal hernia. No bowel obstruction or inflammation. 2. Moderate colonic stool burden with colonic redundancy, can be seen with constipation. 3. Hepatic steatosis. 4. Small hiatal hernia. Aortic Atherosclerosis (ICD10-I70.0). Electronically Signed   By: Andrea Gasman M.D.   On: 04/22/2024 17:21    Impression/Plan: - Recurrent nausea and vomiting over the last 1 month.  Sometimes smell of food itself triggers nausea and vomiting.  CT scan concerning for swirling of mesentery around small bowel without any obstructive  etiology. - Autoimmune myositis on chronic steroids - Chronic constipation - Generalized itching - Central abdominal discomfort. - History of DVT and PE.  Was on Eliquis .  Last dose yesterday per patient. - History of diabetes  Recommendations -------------------------- - Patient's last dose of Eliquis  was yesterday.  Will not be able to get EGD done tomorrow.  Recommend upper GI series with small bowel  follow-through.  Also with concern for small bowel ileus/mesenteric inflammation would avoid EGD as air insufflation during EGD may cause worsening of small bowel ileus. - Recommend changing Protonix  to 40 mg IV twice daily.  Add sucralfate . - DC lactulose  - Continue MiraLAX  - Management options discussed with patient, patient's son as well as patient's wife.  GI will follow.    LOS: 2 days   Layla Lah  MD, FACP 04/24/2024, 11:51 AM  Contact #  [phone]

## 2024-04-25 ENCOUNTER — Inpatient Hospital Stay (HOSPITAL_COMMUNITY)

## 2024-04-25 ENCOUNTER — Other Ambulatory Visit (HOSPITAL_COMMUNITY): Payer: Self-pay

## 2024-04-25 DIAGNOSIS — R1013 Epigastric pain: Secondary | ICD-10-CM | POA: Diagnosis not present

## 2024-04-25 LAB — BASIC METABOLIC PANEL WITH GFR
Anion gap: 12 (ref 5–15)
BUN: 7 mg/dL — ABNORMAL LOW (ref 8–23)
CO2: 23 mmol/L (ref 22–32)
Calcium: 9.4 mg/dL (ref 8.9–10.3)
Chloride: 105 mmol/L (ref 98–111)
Creatinine, Ser: 1.16 mg/dL (ref 0.61–1.24)
GFR, Estimated: >60 mL/min
Glucose, Bld: 159 mg/dL — ABNORMAL HIGH (ref 70–99)
Potassium: 4 mmol/L (ref 3.5–5.1)
Sodium: 139 mmol/L (ref 135–145)

## 2024-04-25 LAB — CBC
HCT: 25.2 % — ABNORMAL LOW (ref 39.0–52.0)
Hemoglobin: 8.5 g/dL — ABNORMAL LOW (ref 13.0–17.0)
MCH: 32.2 pg (ref 26.0–34.0)
MCHC: 33.7 g/dL (ref 30.0–36.0)
MCV: 95.5 fL (ref 80.0–100.0)
Platelets: 225 K/uL (ref 150–400)
RBC: 2.64 MIL/uL — ABNORMAL LOW (ref 4.22–5.81)
RDW: 14.4 % (ref 11.5–15.5)
WBC: 5 K/uL (ref 4.0–10.5)
nRBC: 0.6 % — ABNORMAL HIGH (ref 0.0–0.2)

## 2024-04-25 LAB — GLUCOSE, CAPILLARY
Glucose-Capillary: 156 mg/dL — ABNORMAL HIGH (ref 70–99)
Glucose-Capillary: 255 mg/dL — ABNORMAL HIGH (ref 70–99)

## 2024-04-25 MED ORDER — SUCRALFATE 1 G PO TABS
1.0000 g | ORAL_TABLET | Freq: Two times a day (BID) | ORAL | 0 refills | Status: AC
  Filled 2024-04-25: qty 60, 30d supply, fill #0

## 2024-04-25 MED ORDER — PANTOPRAZOLE SODIUM 40 MG PO TBEC
40.0000 mg | DELAYED_RELEASE_TABLET | Freq: Two times a day (BID) | ORAL | 0 refills | Status: AC
  Filled 2024-04-25: qty 60, 30d supply, fill #0

## 2024-04-25 MED ORDER — ENSURE PLUS HIGH PROTEIN PO LIQD: 237.0000 mL | Freq: Two times a day (BID) | ORAL | Status: DC

## 2024-04-25 MED ORDER — POLYETHYLENE GLYCOL 3350 17 G PO PACK: 17.0000 g | PACK | Freq: Two times a day (BID) | ORAL | Status: DC

## 2024-04-25 MED ORDER — SENNOSIDES-DOCUSATE SODIUM 8.6-50 MG PO TABS: 2.0000 | ORAL_TABLET | Freq: Two times a day (BID) | ORAL | Status: DC

## 2024-04-25 MED ORDER — BISACODYL 10 MG RE SUPP
10.0000 mg | Freq: Once | RECTAL | Status: AC
  Administered 2024-04-25: 10 mg via RECTAL
  Filled 2024-04-25: qty 1

## 2024-04-25 NOTE — Progress Notes (Signed)
"   ° °  Subjective/Chief Complaint: No BM per pt or nurses note    Objective: Vital signs in last 24 hours: Temp:  [98 F (36.7 C)-98.5 F (36.9 C)] 98.5 F (36.9 C) (12/26 2113) Pulse Rate:  [95-110] 95 (12/26 2113) Resp:  [18-20] 18 (12/26 2113) BP: (122-125)/(76-80) 125/80 (12/26 2113) SpO2:  [99 %-100 %] 100 % (12/26 2113) Last BM Date : 04/21/24  Intake/Output from previous day: No intake/output data recorded. Intake/Output this shift: No intake/output data recorded.    Lab Results:  Resp: clear to auscultation bilaterally Cardio: regular rate and rhythm GI: soft NT min distention   BMET Recent Labs    04/24/24 0548 04/25/24 0645  NA 141 139  K 4.2 4.0  CL 107 105  CO2 24 23  GLUCOSE 157* 159*  BUN 8 7*  CREATININE 1.16 1.16  CALCIUM  9.4 9.4   PT/INR No results for input(s): LABPROT, INR in the last 72 hours. ABG No results for input(s): PHART, HCO3 in the last 72 hours.  Invalid input(s): PCO2, PO2  Studies/Results: DG UGI W SMALL BOWEL Result Date: 04/24/2024 CLINICAL DATA:  Chronic vomiting.  Abnormal CT exam EXAM: UPPER GI SERIES WITH SMALL BOWEL FOLLOW-THROUGH FLUOROSCOPY: Radiation Exposure Index (as provided by the fluoroscopic device): 1 05 mGy Kerma TECHNIQUE: Combined double contrast and single contrast upper GI series using effervescent crystals, thick barium, and thin barium. Subsequently, serial images of the small bowel were obtained including spot views of the terminal ileum. COMPARISON:  CT exam 04/23/2019 FINDINGS: Normal anatomy of the esophagus stomach and duodenum. Oral contrast was very slow to empty the stomach. The duodenum crosses the spine in normal fashion ligament Treitz LEFT of the spine. The oral contrast transits entirety of the small bowel in 1 hour 15 minutes. Loops of small bowel were lateral to the ascending colon without evidence obstruction. No inflammation of the small bowel. Normal jejunal pattern and ileal  pattern. Normal terminal ileum. IMPRESSION: 1. Normal anatomy of the esophagus, stomach, and duodenum. 2. Very slow emptying of the stomach. Recommend correlation for gastroparesis 3. Normal small bowel follow-through. Electronically Signed   By: Jackquline Boxer M.D.   On: 04/24/2024 16:41   DG Fluoro Rm 1-60 Min - No Report Result Date: 04/24/2024 Fluoroscopy was utilized by the requesting physician.  No radiographic interpretation.   DG Fluoro Rm 1-60 Min - No Report Result Date: 04/24/2024 Fluoroscopy was utilized by the requesting physician.  No radiographic interpretation.    Anti-infectives: Anti-infectives (From admission, onward)    None       Assessment/Plan: 73 year old male admitted with multi month history of abdominal pain and bloating.  Admitted on 04/22/2024.  CT scan concerning for small bowel volvulus.  The patient has a benign examination today.  No evidence of small bowel obstruction on examination or after reviewing films  At this point, volvulus highly unlikely  History of severe constipation  Recommend GI consultation at this point since he has a nonacute nonsurgical abdomen.  Continue bowel regimen  Advance diet     LOS: 3 days    Debby A Abdirahman Chittum 04/25/2024 Moderate complexity  "

## 2024-04-25 NOTE — Progress Notes (Signed)
 Patient declined to take Seroquel  at bedtime as scheduled. He called out asking for his neuropathy medication. Advised that Cymbalta  is only scheduled once daily at 10 am. Wife states patient is having some anxiety and wanted him to take the Seroquel  now to see if it would help, Seroquel  administered.

## 2024-04-25 NOTE — Discharge Summary (Signed)
 " Physician Discharge Summary   Patient: [name] MRN: [id number] DOB: 30-Jun-1950  Admit date:     04/22/2024  Discharge date: 04/25/2024  Discharge Physician: Lebron JINNY Cage   PCP: Alvia Bring, DO   Recommendations at discharge:   Follow-up with PCP in 1 week Follow-up with outpatient [facility] GI as scheduled   Discharge Diagnoses: Principal Problem:   Epigastric pain Active Problems:   Autoimmune myositis of skeletal muscle   Benign prostatic hyperplasia   CKD stage 3a, GFR 45-59 ml/min (HCC)   Internal hernia   Heart failure with recovered ejection fraction (HFrecEF) (HCC)   Uncontrolled type 2 diabetes mellitus with hyperglycemia, with long-term current use of insulin  (HCC)   Intractable nausea and vomiting    Hospital Course: [name] is a 73 y.o. male with medical history significant for HTN, T2DM, HLD, CAD, autoimmune myositis on chronic steroids, necrotizing myopathy, CHF, PE/DVT on Eliquis , hypothyroidism, PTSD, rheumatoid arthritis, venous stasis ulcer, chronic leg swelling, anxiety, depression and CKD 3A who presented to [facility] ED for evaluation of epigastric pain, nausea and vomiting with constipation, intermittently for about a month. Patient also endorsed history of severe pruritus from multiple medications he is allergic to, now recently morphine  which he received in the ED with now uncontrollable itching. Spouse reports that patient is scheduled to have an abdominal ultrasound, endoscopy and colonoscopy in January for further evaluation of his abdominal symptoms. In the ED, patient noted to be tachypneic/tachycardic, otherwise WNL. Initial labs significant for creatinine 1.36, WBC 7.8, Hgb 9.6, troponin 124-133, UA with no signs of infection. CT A/P shows findings suspicious for internal hernia, moderate colonic stool burden and hepatic steatosis.  EDP discussed with cardiology due to elevated troponin, no further workup.  General surgery  consulted.  Patient admitted for further management.     Today, patient had multiple large bowel movements, has been able to tolerate diet without any significant nausea/vomiting.  Patient denies any shortness of breath, chest pains, abdominal pain, fever/chills.  Discussed extensively with patient and wife about the need to follow-up with GI at [facility], has appointment scheduled in January 2026.  Encourage patient and wife to keep a daily bowel regimen to avoid significant constipation.    Assessment and Plan: Epigastric pain/intractable nausea,vomiting Constipation ??Internal hernia CT A/P showing some swirling of the small bowel mesentery and slight mesenteric engorgement suspicious for internal hernia but no bowel obstruction or inflammation Abdominal x-ray with moderate to severe stool burden and gas in the rectum with mild ileus related to constipation General Surgery consulted, no surgical intervention indicated at the moment GI consulted, recommend upper GI series with small bowel follow-through, which was suspicious for slow emptying of the stomach likely gastroparesis Continue bowel regimen with MiraLAX  as per GI Patient will need outpatient gastric emptying scan to rule out possible gastroparesis Continue PPI, Protonix  twice daily x 4 weeks, then changed to Protonix  once daily, continue sucralfate  upon discharge x 4 weeks Follow-up with GI at [facility] clinic scheduled in January 2026 as per wife and patient   Pruritus Reports a history of pruritus from multiple medications such as doxycycline , hydrocodone and hydroxyzine , now morphine  Follows with dermatology, continue triamcinolone  cream, ciclopirox  olamine, Desitin  Elevated troponin Troponin elevated to 124-133, patient without any chest pain, EKG with no ischemic changes Likely demand ischemia in the setting of persistent abdominal pain, internal hernia and dehydration Cardiology consulted by EDP, they do not think this is  likely ACS but likely stress response  from his internal hernia Telemetry, with no acute events   T2DM with hyperglycemia, with neuropathy A1c 7.5 on 04/23/2024 Continue home regimen Continue Cymbalta    HFpEF Bilateral lymphedema Recent TTE in 08/2022 showed recovered EF to 60-65%, mild LVH and no valvular abnormality Continue home torsemide    CKD 3A Creatinine baseline around 1.3-1.4   Anemia of chronic disease Hemoglobin baseline around 9.9   History of PE/DVT Continue Eliquis    Autoimmune myositis Necrotizing myopathy Follows with neurology at [facility] Continue baclofen  and prednisone    Hypothyroidism Continue Synthroid    Anxiety and depression Continue duloxetine  and Seroquel    BPH Continue oxybutynin  and tamsulosin    Generalized weakness Dehydration Secondary to persistent nausea and vomiting S/p IVF PT/OT   Obesity Lifestyle modification advised        Pain control - [place]  Controlled Substance Reporting System database was reviewed. and patient was instructed, not to drive, operate heavy machinery, perform activities at heights, swimming or participation in water activities or provide baby-sitting services while on Pain, Sleep and Anxiety Medications; until their outpatient Physician has advised to do so again. Also recommended to not to take more than prescribed Pain, Sleep and Anxiety Medications.    Consultants: GI, general surgery Procedures performed: None Disposition: Home Diet recommendation:  Cardiac and Carb modified diet, small frequent meals    DISCHARGE MEDICATION: Allergies as of 04/25/2024       Reactions   Doxycycline  Itching   Itching, weakness, stomach pains, nausea, eye pain, sore throat and rash   Hydroxyzine  Other (See Comments)   Itching and rash   Empagliflozin    Other Reaction(s): Candidiasis   Morphine  Itching   Semaglutide Diarrhea, Rash   Diarrhea   Benazepril Hives, Cough   Cephalexin  Hives   welts    Gabapentin  Swelling   Hydrocodone Hives, Itching   Shellfish Allergy Diarrhea, Nausea And Vomiting        Medication List     STOP taking these medications    amitriptyline  25 MG tablet Commonly known as: ELAVIL    omeprazole 20 MG capsule Commonly known as: PRILOSEC       TAKE these medications    acetaminophen  500 MG tablet Commonly known as: TYLENOL  Take 500-1,000 mg by mouth See admin instructions. Take 1000 mg by mouth in the morning and 500 mg at bedtime   AMBULATORY NON FORMULARY MEDICATION Please provide 3 in 1 bedside commode.  Dx:R29.6, M62.81   AMBULATORY NON FORMULARY MEDICATION Please provide bilateral thigh high compression stockings: Graded compression of 20-57mmhg   apixaban  5 MG Tabs tablet Commonly known as: ELIQUIS  Take 1 tablet (5 mg total) by mouth 2 (two) times daily.   artificial tears ophthalmic solution Place 1 drop into both eyes in the morning and at bedtime.   baclofen  20 MG tablet Commonly known as: LIORESAL  TAKE 1 TABLET BY MOUTH TWICE A DAY   bisacodyl  10 MG suppository Commonly known as: DULCOLAX Place 1 suppository (10 mg total) rectally as needed for moderate constipation.   calcium  carbonate 600 MG Tabs tablet Commonly known as: OS-CAL Take 600 mg by mouth daily.   cetirizine  10 MG tablet Commonly known as: ZYRTEC  Take 1 tablet (10 mg total) by mouth daily.   ciclopirox  0.77 % cream Commonly known as: LOPROX  Apply topically.   docusate 50 MG/5ML liquid Commonly known as: COLACE Take 50 mg by mouth daily.   DULoxetine  30 MG capsule Commonly known as: CYMBALTA  Take 30 mg by mouth daily.   EPINEPHrine  0.3 mg/0.3  mL Soaj injection Commonly known as: EPI-PEN Inject 0.3 mg into the muscle as needed for anaphylaxis.   folic acid  1 MG tablet Commonly known as: FOLVITE  Take 2 mg by mouth daily.   insulin  glargine 100 UNIT/ML injection Commonly known as: LANTUS  Inject 0.38 mLs (38 Units total) into the skin daily.    lactulose  10 GM/15ML solution Commonly known as: CHRONULAC  Take 15 mLs (10 g total) by mouth 3 (three) times daily.   latanoprost  0.005 % ophthalmic solution Commonly known as: XALATAN  1 drop at bedtime.   Levothyroxine  Sodium 75 MCG Caps Take 75 mcg by mouth daily.   metFORMIN 500 MG 24 hr tablet Commonly known as: GLUCOPHAGE-XR Take 500 mg by mouth 2 (two) times daily with a meal.   methotrexate  5 MG tablet Commonly known as: RHEUMATREX Take 3 tablets by mouth once a week. Caution: Chemotherapy. Protect from light. What changed:  how much to take additional instructions   NON FORMULARY Apply 500 mg topically daily.   NovoLOG  FlexPen 100 UNIT/ML FlexPen Generic drug: insulin  aspart Inject 15 Units into the skin 3 (three) times daily with meals.   oxybutynin  5 MG tablet Commonly known as: DITROPAN  Take 5 mg by mouth 3 (three) times daily.   pantoprazole  40 MG tablet Commonly known as: Protonix  Take 1 tablet (40 mg total) by mouth 2 (two) times daily.   polyethylene glycol powder 17 GM/SCOOP powder Commonly known as: MiraLax  Take 17 g by mouth in the morning, at noon, and at bedtime. Take TID until having regular bowel movement, then QD   PREDNISONE  PO Take 8 mg by mouth daily.   QUEtiapine  50 MG tablet Commonly known as: SEROQUEL  Take by mouth as directed.   Repatha  SureClick 140 MG/ML Soaj Generic drug: Evolocumab  Inject 140 mg into the skin every 14 (fourteen) days. What changed: Another medication with the same name was removed. Continue taking this medication, and follow the directions you see here.   sertraline  25 MG tablet Commonly known as: ZOLOFT  Take 2 tablets (50 mg total) by mouth daily. Take 50mg  daily. What changed: additional instructions   sucralfate  1 g tablet Commonly known as: CARAFATE  Take 1 tablet (1 g total) by mouth 2 (two) times daily.   SUMAtriptan 100 MG tablet Commonly known as: IMITREX TAKE ONE TABLET BY MOUTH AS DIRECTED  FOR MIGRAINE HEADACHE (FIRST DOSE AT ONSET OF HEADACHE,THEN REPEAT AFTER 2 HOURS IF NO RELIEF-NO MORE THAN 200 MG PER DAY)   tamsulosin  0.4 MG Caps capsule Commonly known as: FLOMAX  Take 0.4 mg by mouth daily.   torsemide  20 MG tablet Commonly known as: DEMADEX  Take 1 tablet (20 mg total) by mouth daily. May take additional tablet as directed by lymphedema clinic or cardiologist.   TUMS PO Take 3 tablets by mouth 2 (two) times daily as needed (heartburn).        Follow-up Information     Alvia Bring, DO. Schedule an appointment as soon as possible for a visit in 1 week(s).   Specialty: Family Medicine Contact information: [address]  [address] [place] [address] [phone]                Discharge Exam: [name]   04/22/24 1340  Weight: 96.6 kg   General: NAD  Cardiovascular: S1, S2 present Respiratory: CTAB Abdomen: Soft, nontender, nondistended, bowel sounds present Musculoskeletal: No bilateral pedal edema noted Skin: Normal Psychiatry: Normal mood   Condition at discharge: stable  The results of  significant diagnostics from this hospitalization (including imaging, microbiology, ancillary and laboratory) are listed below for reference.   Imaging Studies: DG Abd 1 View Result Date: 04/25/2024 CLINICAL DATA:  Abdominal bloating. EXAM: DG ABDOMEN 1V COMPARISON:  Radiograph 04/23/2024. Included portions from upper GI yesterday FINDINGS: Enteric contrast on prior exam has progressed to the colon. There may be minimal residual contrast within the terminal ileum. Air within colon and small bowel in the right abdomen. No evidence of obstruction. IMPRESSION: Enteric contrast on prior exam has progressed to the colon. No evidence of obstruction. Electronically Signed   By: Andrea Gasman M.D.   On: 04/25/2024 13:31   DG UGI W SMALL BOWEL Result Date: 04/24/2024 CLINICAL DATA:  Chronic vomiting.  Abnormal CT exam EXAM: UPPER GI SERIES  WITH SMALL BOWEL FOLLOW-THROUGH FLUOROSCOPY: Radiation Exposure Index (as provided by the fluoroscopic device): 1 05 mGy Kerma TECHNIQUE: Combined double contrast and single contrast upper GI series using effervescent crystals, thick barium, and thin barium. Subsequently, serial images of the small bowel were obtained including spot views of the terminal ileum. COMPARISON:  CT exam 04/23/2019 FINDINGS: Normal anatomy of the esophagus stomach and duodenum. Oral contrast was very slow to empty the stomach. The duodenum crosses the spine in normal fashion ligament Treitz LEFT of the spine. The oral contrast transits entirety of the small bowel in 1 hour 15 minutes. Loops of small bowel were lateral to the ascending colon without evidence obstruction. No inflammation of the small bowel. Normal jejunal pattern and ileal pattern. Normal terminal ileum. IMPRESSION: 1. Normal anatomy of the esophagus, stomach, and duodenum. 2. Very slow emptying of the stomach. Recommend correlation for gastroparesis 3. Normal small bowel follow-through. Electronically Signed   By: Jackquline Boxer M.D.   On: 04/24/2024 16:41   DG Fluoro Rm 1-60 Min - No Report Result Date: 04/24/2024 Fluoroscopy was utilized by the requesting physician.  No radiographic interpretation.   DG Fluoro Rm 1-60 Min - No Report Result Date: 04/24/2024 Fluoroscopy was utilized by the requesting physician.  No radiographic interpretation.   DG Abd 2 Views Result Date: 04/23/2024 CLINICAL DATA:  Small bowel obstruction. EXAM: ABDOMEN - 2 VIEW COMPARISON:  CT abdomen pelvis 04/22/2024. FINDINGS: Mild gaseous distention of small bowel with moderate to severe stool burden. Gas in the rectum. No unexpected radiopaque calculi. Contrast is seen in a distended bladder. IMPRESSION: Mild gaseous distension of bowel in the central abdomen with moderate to severe stool burden and gas in the rectum. Findings may be due to a mild ileus related to constipation. CT  abdomen pelvis performed yesterday. Electronically Signed   By: Newell Eke M.D.   On: 04/23/2024 10:29   CT ABDOMEN PELVIS W CONTRAST Result Date: 04/22/2024 CLINICAL DATA:  Acute abdominal pain. EXAM: CT ABDOMEN AND PELVIS WITH CONTRAST TECHNIQUE: Multidetector CT imaging of the abdomen and pelvis was performed using the standard protocol following bolus administration of intravenous contrast. RADIATION DOSE REDUCTION: This exam was performed according to the departmental dose-optimization program which includes automated exposure control, adjustment of the mA and/or kV according to patient size and/or use of iterative reconstruction technique. CONTRAST:  OMNIPAQUE  IOHEXOL  300 MG/ML  SOLN COMPARISON:  07/21/2021 FINDINGS: Lower chest: Small hiatal hernia. No acute airspace disease or pleural effusion. Hepatobiliary: Liver is prominent size spanning 17.2 cm cranial caudal with hepatic steatosis. No focal liver lesion. Minimally distended gallbladder. No calcified gallstone or pericholecystic inflammation. No biliary dilatation. Pancreas: Mild parenchymal atrophy. No ductal dilatation or inflammation.  Spleen: Normal in size without focal abnormality. Adrenals/Urinary Tract: No adrenal nodules. Slight prominence of both renal collecting systems without frank hydronephrosis. Homogeneous renal enhancement with symmetric excretion on delayed phase imaging. Lobulated renal contours. No renal stone or focal lesion. The urinary bladder is moderately distended. No bladder wall thickening. Stomach/Bowel: Small hiatal hernia. No small bowel obstruction or inflammation. There is swirling of the small bowel mesentery with small-bowel loops projecting lateral to the ascending colon in the right abdomen. Slight mesenteric engorgement. No bowel pneumatosis. The appendix is not well demonstrated on the current exam. Moderate colonic stool burden. The sigmoid colon is redundant and courses into the right abdomen. No  colonic wall thickening or inflammation. Vascular/Lymphatic: Left IVC. Aortic atherosclerosis. No aortic aneurysm. The portal vein is patent. No suspicious lymphadenopathy. Reproductive: Prostate is unremarkable. Other: No free air, free fluid, or intra-abdominal fluid collection. Small fat containing umbilical hernia. Minimal fat in the inguinal canals. Musculoskeletal: Degenerative change throughout the spine partial fusion of the sacroiliac joints. There are no acute or suspicious osseous abnormalities. IMPRESSION: 1. Swirling of the small bowel mesentery with small-bowel loops projecting lateral to the ascending colon in the right abdomen. Slight mesenteric engorgement. Findings are suspicious for internal hernia. No bowel obstruction or inflammation. 2. Moderate colonic stool burden with colonic redundancy, can be seen with constipation. 3. Hepatic steatosis. 4. Small hiatal hernia. Aortic Atherosclerosis (ICD10-I70.0). Electronically Signed   By: Andrea Gasman M.D.   On: 04/22/2024 17:21    Microbiology: Results for orders placed or performed during the hospital encounter of 09/12/22  MRSA Next Gen by PCR, Nasal     Status: None   Collection Time: 09/12/22  6:55 PM   Specimen: Nasal Mucosa; Nasal Swab  Result Value Ref Range Status   MRSA by PCR Next Gen NOT DETECTED NOT DETECTED Final    Comment: (NOTE) The GeneXpert MRSA Assay (FDA approved for NASAL specimens only), is one component of a comprehensive MRSA colonization surveillance program. It is not intended to diagnose MRSA infection nor to guide or monitor treatment for MRSA infections. Test performance is not FDA approved in patients less than 49 years old. Performed at [facility], [address] [place], [place] [address]     Labs: CBC: Recent Labs  Lab 04/22/24 1344 04/23/24 0014 04/24/24 0548 04/25/24 0645  WBC 7.8 7.5 6.2 5.0  HGB 9.6* 9.8* 8.4* 8.5*  HCT 28.6* 29.3* 25.7* 25.2*  MCV 93.8 95.1 97.7 95.5   PLT 281 295 231 225   Basic Metabolic Panel: Recent Labs  Lab 04/22/24 1344 04/23/24 0701 04/24/24 0548 04/25/24 0645  NA 140 146* 141 139  K 4.3 4.6 4.2 4.0  CL 101 108 107 105  CO2 27 28 24 23   GLUCOSE 139* 111* 157* 159*  BUN 12 8 8  7*  CREATININE 1.36* 1.25* 1.16 1.16  CALCIUM  10.1 10.2 9.4 9.4   Liver Function Tests: Recent Labs  Lab 04/22/24 1344 04/23/24 0701  AST 25 22  ALT 16 11  ALKPHOS 64 75  BILITOT 0.7 0.6  PROT 6.2* 5.8*  ALBUMIN 4.0 3.7   CBG: Recent Labs  Lab 04/24/24 1145 04/24/24 1708 04/24/24 2111 04/25/24 0739 04/25/24 1200  GLUCAP 148* 201* 169* 156* 255*    Discharge time spent: greater than 30 minutes.  Signed: Lebron JINNY Cage, MD [facility] 04/25/2024 "

## 2024-04-25 NOTE — Progress Notes (Signed)
 [facility] Progress Note  Traye Goldbach 73 y.o. 1951-04-22  CC: Abdominal pain, nausea and vomiting   Subjective: Patient seen and examined at bedside.  Feeling better today.  Had a bowel movement.  Denies any further nausea or vomiting.  Wife and son at bedside.  ROS : Afebrile, negative for chest pain   Objective: Vital signs in last 24 hours: Vitals:   04/24/24 1331 04/24/24 2113  BP: 122/76 125/80  Pulse: (!) 110 95  Resp: 20 18  Temp: 98 F (36.7 C) 98.5 F (36.9 C)  SpO2: 99% 100%    Physical Exam: Resting comfortably, not in acute distress.  Abdominal exam benign.  Mood and affect normal.  Alert and oriented x 3.  Lab Results: Recent Labs    04/24/24 0548 04/25/24 0645  NA 141 139  K 4.2 4.0  CL 107 105  CO2 24 23  GLUCOSE 157* 159*  BUN 8 7*  CREATININE 1.16 1.16  CALCIUM  9.4 9.4   Recent Labs    04/22/24 1344 04/23/24 0701  AST 25 22  ALT 16 11  ALKPHOS 64 75  BILITOT 0.7 0.6  PROT 6.2* 5.8*  ALBUMIN 4.0 3.7   Recent Labs    04/24/24 0548 04/25/24 0645  WBC 6.2 5.0  HGB 8.4* 8.5*  HCT 25.7* 25.2*  MCV 97.7 95.5  PLT 231 225   No results for input(s): LABPROT, INR in the last 72 hours.    Assessment/Plan: - Recurrent nausea and vomiting over the last 1 month.  Sometimes smell of food itself triggers nausea and vomiting.  CT scan concerning for swirling of mesentery around small bowel without any obstructive etiology. - Autoimmune myositis on chronic steroids - Chronic constipation - Generalized itching - Central abdominal discomfort. - History of DVT and PE.  Was on Eliquis .  Last dose yesterday per patient. - History of diabetes   Recommendations -------------------------- - Upper GI small bowel follow-through yesterday showed no acute changes.  Showed very slow emptying of the stomach likely gastroparesis.  - Recommend outpatient gastric emptying scan to rule out underlying gastroparesis -Gastroparesis diet  discussed - Protonix  40 mg twice a day for 4 weeks then changed to Protonix  40 mg once a day.  Sucralfate  1 g twice daily for 4 weeks. - Continue MiraLAX  - Follow-up with primary GI physician at [facility] or at [facility]. -Okay to resume anticoagulation from GI standpoint. - Okay to discharge from GI standpoint.  GI will sign off.  Call us  back if needed.   Layla Lah MD, FACP 04/25/2024, 10:11 AM  Contact #  [phone]

## 2024-04-25 NOTE — Plan of Care (Signed)
" °  Problem: Education: Goal: Knowledge of General Education information will improve Description: Including pain rating scale, medication(s)/side effects and non-pharmacologic comfort measures Outcome: Progressing   Problem: Health Behavior/Discharge Planning: Goal: Ability to manage health-related needs will improve Outcome: Progressing   Problem: Clinical Measurements: Goal: Ability to maintain clinical measurements within normal limits will improve Outcome: Progressing Goal: Diagnostic test results will improve Outcome: Progressing Goal: Respiratory complications will improve Outcome: Progressing Goal: Cardiovascular complication will be avoided Outcome: Progressing   Problem: Activity: Goal: Risk for activity intolerance will decrease Outcome: Progressing   Problem: Coping: Goal: Level of anxiety will decrease Outcome: Progressing   Problem: Elimination: Goal: Will not experience complications related to bowel motility Outcome: Progressing Goal: Will not experience complications related to urinary retention Outcome: Progressing   Problem: Pain Managment: Goal: General experience of comfort will improve and/or be controlled Outcome: Progressing   Problem: Safety: Goal: Ability to remain free from injury will improve Outcome: Progressing   Problem: Skin Integrity: Goal: Risk for impaired skin integrity will decrease Outcome: Progressing   Problem: Education: Goal: Ability to describe self-care measures that may prevent or decrease complications (Diabetes Survival Skills Education) will improve Outcome: Progressing   Problem: Health Behavior/Discharge Planning: Goal: Ability to manage health-related needs will improve Outcome: Progressing   Problem: Metabolic: Goal: Ability to maintain appropriate glucose levels will improve Outcome: Progressing   Problem: Nutritional: Goal: Maintenance of adequate nutrition will improve Outcome: Progressing   Problem:  Skin Integrity: Goal: Risk for impaired skin integrity will decrease Outcome: Progressing   "

## 2024-04-25 NOTE — Evaluation (Signed)
 Occupational Therapy Evaluation Patient Details Name: [name] MRN: [id number] DOB: [birth year] Today's Date: 04/25/2024   History of Present Illness   73 y.o. male presented to [facility] ED for evaluation of epigastric pain, nausea and vomiting with constipation, intermittently for about a month. Patient also endorsed history of severe pruritus from multiple medications he is allergic to. CT suspicious for internal hernia. 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  Today, pt completes bed mobility with supervision, transfers and mobility using RW with contact guard assist, and needing up to min assist for ADLs. Pt with supportive family, who provide 24/7 support.  Will follow acutely to optimize independence and safety with ADLs and mobility, but anticipate no further needs required after dc home.       If plan is discharge home, recommend the following:   A little help with walking and/or transfers;A little help with bathing/dressing/bathroom;Assistance with cooking/housework;Assist for transportation;Help with stairs or ramp for entrance     Functional Status Assessment   Patient has had a recent decline in their functional status and demonstrates the ability to make significant improvements in function in a reasonable and predictable amount of time.     Equipment Recommendations   None recommended by OT     Recommendations for Other Services         Precautions/Restrictions   Precautions Precautions: Fall Recall of Precautions/Restrictions:  Intact Restrictions Weight Bearing Restrictions Per Provider Order: No     Mobility Bed Mobility Overal bed mobility: Needs Assistance Bed Mobility: Supine to Sit     Supine to sit: Supervision          Transfers Overall transfer level: Needs assistance Equipment used: Rolling walker (2 wheels) Transfers: Sit to/from Stand Sit to Stand: Contact guard assist           General transfer comment: contact guard for safety, cueing for hand placement      Balance Overall balance assessment: Mild deficits observed, not formally tested                                         ADL either performed or assessed with clinical judgement   ADL Overall ADL's : Needs assistance/impaired     Grooming: Set up;Sitting           Upper Body Dressing : Set up;Sitting   Lower Body Dressing: Minimal assistance;Sit to/from stand   Toilet Transfer: Contact guard assist;Ambulation;Rolling walker (2 wheels)           Functional mobility during ADLs: Contact guard assist;Rolling walker (2 wheels);Cueing for safety       Vision   Vision Assessment?: No apparent visual deficits     Perception         Praxis         Pertinent Vitals/Pain Pain Assessment Pain Assessment: No/denies pain     Extremity/Trunk Assessment Upper Extremity Assessment Upper Extremity Assessment: Overall WFL for tasks assessed   Lower Extremity Assessment Lower Extremity Assessment: Defer to PT evaluation   Cervical / Trunk  Assessment Cervical / Trunk Assessment: Normal   Communication Communication Communication: No apparent difficulties   Cognition Arousal: Alert Behavior During Therapy: Flat affect Cognition: No apparent impairments             OT - Cognition Comments: not formally assessed, appears baseline                 Following commands: Intact       Cueing  General Comments   Cueing Techniques: Verbal cues;Tactile cues;Visual  cues  spouse and son present   Exercises     Shoulder Instructions      Home Living Family/patient expects to be discharged to:: Private residence Living Arrangements: Spouse/significant other;Children Available Help at Discharge: Family;Available 24 hours/day Type of Home: House Home Access: Other (comment)     Home Layout: Two level;1/2 bath on main level;Bed/bath upstairs Alternate Level Stairs-Number of Steps: flight Alternate Level Stairs-Rails: Left Bathroom Shower/Tub: Walk-in shower   Bathroom Toilet: Handicapped height Bathroom Accessibility: No   Home Equipment: Rollator (4 wheels);Shower seat;BSC/3in1;Cane - single point;Other (comment);Adaptive equipment (walker wheelchair) Adaptive Equipment: Reacher Additional Comments:  is a bench      Prior Functioning/Environment Prior Level of Function : Needs assist;History of Falls (last six months)             Mobility Comments: uses rollator in the home, uses cane and has assist to do flight of stairs inside home, has lift to get in from garage; rollator does not fit in the bathroom and uses cane in bathroom ADLs Comments: needs assist for LB dressing at times, able to manage bathing; spouse completes IADLs, meds and driving    OT Problem List: Decreased strength;Decreased activity tolerance;Impaired balance (sitting and/or standing);Decreased knowledge of use of DME or AE;Decreased knowledge of precautions;Decreased safety awareness;Obesity   OT Treatment/Interventions: Self-care/ADL training;Therapeutic exercise;DME and/or AE instruction;Therapeutic activities;Patient/family education;Balance training;Energy conservation      OT Goals(Current goals can be found in the care plan section)   Acute Rehab OT Goals Patient Stated Goal: home when ready OT Goal Formulation: With patient Time For Goal Achievement: 05/09/24 Potential to Achieve Goals: Good   OT Frequency:  Min 2X/week    Co-evaluation               AM-PAC OT 6 Clicks Daily Activity     Outcome Measure Help from another person eating meals?: None Help from another person taking care of personal grooming?: A Little Help from another person toileting, which includes using toliet, bedpan, or urinal?: A Little Help from another person bathing (including washing, rinsing, drying)?: A Little Help from another person to put on and taking off regular upper body clothing?: A Little Help from another person to put on and taking off regular lower body clothing?: A Little 6 Click Score: 19   End of Session Equipment Utilized During Treatment: Rolling walker (2 wheels) Nurse Communication: Mobility status;Precautions;Other (comment) (asking for coffee and broth)  Activity Tolerance: Patient tolerated treatment well Patient left: in chair;with call bell/phone within reach;with chair alarm set;with family/visitor present  OT Visit Diagnosis: Other abnormalities of gait and mobility (R26.89);Muscle weakness (generalized) (M62.81);History of falling (Z91.81)                Time: 1250-1310 OT Time Calculation (min): 20 min Charges:  OT General Charges $OT Visit: 1 Visit OT Evaluation $OT Eval Low Complexity: 1 Low  Etta NOVAK, OT Acute Rehabilitation Services Office [phone] Secure Chat Preferred    Etta GORMAN [place]  04/25/2024, 1:23 PM

## 2024-04-27 ENCOUNTER — Telehealth: Payer: Self-pay

## 2024-04-27 NOTE — Transitions of Care (Post Inpatient/ED Visit) (Signed)
" ° °  04/27/2024  Name: [name] MRN: [id number] DOB: 12-02-1950  Today's TOC FU Call Status: Today's TOC FU Call Status:: Unsuccessful Call (1st Attempt) Unsuccessful Call (1st Attempt) Date: 04/27/24  Attempted to reach the patient regarding the most recent Inpatient/ED visit.  Follow Up Plan: Additional outreach attempts will be made to reach the patient to complete the Transitions of Care (Post Inpatient/ED visit) call.   Shona Prow RN, CCM [place]  VBCI-Population Health RN Care Manager [phone]  "

## 2024-04-28 ENCOUNTER — Telehealth: Payer: Self-pay

## 2024-04-28 NOTE — Transitions of Care (Post Inpatient/ED Visit) (Signed)
" ° °  04/28/2024  Name: [name] MRN: [id number] DOB: 12-12-1950  Today's TOC FU Call Status: Today's TOC FU Call Status:: Unsuccessful Call (2nd Attempt) Unsuccessful Call (2nd Attempt) Date: 04/28/24  Attempted to reach the patient regarding the most recent Inpatient/ED visit.  Follow Up Plan: Additional outreach attempts will be made to reach the patient to complete the Transitions of Care (Post Inpatient/ED visit) call.   Shona Prow RN, CCM [place]  VBCI-Population Health RN Care Manager [phone]  "

## 2024-04-29 ENCOUNTER — Telehealth: Payer: Self-pay

## 2024-04-29 NOTE — Transitions of Care (Post Inpatient/ED Visit) (Signed)
" ° °  04/29/2024  Name: [name] MRN: [id number] DOB: 1950/07/18  Today's TOC FU Call Status: Today's TOC FU Call Status:: Unsuccessful Call (3rd Attempt) Unsuccessful Call (3rd Attempt) Date: 04/29/24  Attempted to reach the patient regarding the most recent Inpatient/ED visit.  Follow Up Plan: No further outreach attempts will be made at this time. We have been unable to contact the patient.  Shona Prow RN, CCM [place]  VBCI-Population Health RN Care Manager [phone]  "

## 2024-05-01 ENCOUNTER — Telehealth (HOSPITAL_BASED_OUTPATIENT_CLINIC_OR_DEPARTMENT_OTHER): Payer: Self-pay

## 2024-05-01 NOTE — Telephone Encounter (Signed)
 Pharmacy please advise on holding Eliquis  prior to EGD/colonoscopy scheduled for 05/19/2024. Thank you.   Last labs: 04/25/2024

## 2024-05-01 NOTE — Telephone Encounter (Signed)
"  ° °  Pre-operative Risk Assessment    Patient Name: [name]  DOB: 06/13/1950 MRN: [id number]   Date of last office visit: 04/17/24 with Vannie Date of next office visit: NA  Request for Surgical Clearance    Procedure:  EGD and colonoscopy   Date of Surgery:  Clearance 05/19/24                                 Surgeon:  Dr. Tamela Surgeon's Group or Practice Name:  [facility]  Phone number:  [phone] Fax number:  [phone]   Type of Clearance Requested:   - Medical  - Pharmacy:  Hold Apixaban  (Eliquis ) for 2 days prior   Type of Anesthesia:  MAC   Additional requests/questions:    Bonney Augustin JONETTA Delores   05/01/2024, 11:40 AM   "

## 2024-05-01 NOTE — Telephone Encounter (Signed)
 Caitlin, you recently saw this pt in clinic. Are you able to comment on surgical clearance for upcoming EGD/colonoscopy scheduled for 05/19/2024? Please route your response to P CV DIV PREOP. Thank you!

## 2024-05-01 NOTE — Telephone Encounter (Signed)
"  ° °  Patient Name: [name]  DOB: [birth year] MRN: [id number]  Primary Cardiologist: Vinie JAYSON Maxcy, MD  Chart reviewed as part of pre-operative protocol coverage. Given past medical history and time since last visit, based on ACC/AHA guidelines, [name] is at acceptable risk for the planned procedure without further cardiovascular testing.   Preop clearance for same procedure, same procedural team previously addressed by phone encounter 04/13/24 and routed to procedural team 04/17/24. Unclear why duplicate request received.   Since last visit 04/17/24 he was admitted 12/24-12/27/25 with epigastric pain. Elevated troponin in setting of abdominal pain, internal hernia, dehydration. Reviewed by cardiology inpatient with no further testing recommended.   Per AHA/ACC guidelines, he is deemed acceptable risk for the planned procedure without additional cardiovascular testing.   As per previous recommendations, may hold Eliquis  2 days prior to planned procedure.   I will route this recommendation to the requesting party via Epic fax function and remove from pre-op pool.  Please call with questions.  Reche GORMAN Finder, NP 05/01/2024, 12:26 PM  "
